# Patient Record
Sex: Male | Born: 1940
Health system: Southern US, Community
[De-identification: ages and names within clinical notes are randomized; demographics above are authoritative.]

## PROBLEM LIST (undated history)

## (undated) DIAGNOSIS — G629 Polyneuropathy, unspecified: Secondary | ICD-10-CM

## (undated) DIAGNOSIS — G250 Essential tremor: Secondary | ICD-10-CM

## (undated) DIAGNOSIS — C61 Malignant neoplasm of prostate: Secondary | ICD-10-CM

## (undated) DIAGNOSIS — R972 Elevated prostate specific antigen [PSA]: Secondary | ICD-10-CM

## (undated) DIAGNOSIS — M549 Dorsalgia, unspecified: Secondary | ICD-10-CM

## (undated) DIAGNOSIS — E7800 Pure hypercholesterolemia, unspecified: Secondary | ICD-10-CM

## (undated) DIAGNOSIS — R000 Tachycardia, unspecified: Secondary | ICD-10-CM

## (undated) HISTORY — DX: Dorsalgia, unspecified: M54.9

## (undated) HISTORY — DX: Pure hypercholesterolemia, unspecified: E78.00

## (undated) HISTORY — DX: Polyneuropathy, unspecified: G62.9

## (undated) HISTORY — PX: LUMBAR MICRODISCECTOMY: SHX99

## (undated) HISTORY — DX: Elevated prostate specific antigen (PSA): R97.20

## (undated) HISTORY — DX: Essential tremor: G25.0

---

## 1999-09-04 ENCOUNTER — Encounter: Payer: Self-pay | Admitting: Emergency Medicine

## 1999-09-04 ENCOUNTER — Encounter: Admission: RE | Admit: 1999-09-04 | Discharge: 1999-09-04 | Payer: Self-pay | Admitting: Emergency Medicine

## 2003-05-03 ENCOUNTER — Encounter: Admission: RE | Admit: 2003-05-03 | Discharge: 2003-05-03 | Payer: Self-pay | Admitting: Neurosurgery

## 2003-07-04 ENCOUNTER — Encounter: Admission: RE | Admit: 2003-07-04 | Discharge: 2003-07-04 | Payer: Self-pay | Admitting: Neurosurgery

## 2009-10-21 ENCOUNTER — Encounter: Admission: RE | Admit: 2009-10-21 | Discharge: 2009-10-21 | Payer: Self-pay | Admitting: Orthopedic Surgery

## 2010-05-20 ENCOUNTER — Encounter: Payer: Self-pay | Admitting: Orthopedic Surgery

## 2010-11-09 ENCOUNTER — Other Ambulatory Visit: Payer: Self-pay | Admitting: Orthopaedic Surgery

## 2010-11-09 DIAGNOSIS — M545 Low back pain, unspecified: Secondary | ICD-10-CM

## 2010-11-10 ENCOUNTER — Ambulatory Visit
Admission: RE | Admit: 2010-11-10 | Discharge: 2010-11-10 | Disposition: A | Discharge status: Home / Self Care | Payer: Medicare Other | Source: Ambulatory Visit | Attending: Orthopaedic Surgery | Admitting: Orthopaedic Surgery

## 2010-11-10 DIAGNOSIS — M545 Low back pain, unspecified: Secondary | ICD-10-CM

## 2014-02-03 ENCOUNTER — Other Ambulatory Visit: Payer: Self-pay | Admitting: Orthopedic Surgery

## 2014-02-03 DIAGNOSIS — M7918 Myalgia, other site: Secondary | ICD-10-CM

## 2014-02-04 ENCOUNTER — Ambulatory Visit
Admission: RE | Admit: 2014-02-04 | Discharge: 2014-02-04 | Disposition: A | Discharge status: Home / Self Care | Payer: Medicare Other | Source: Ambulatory Visit | Attending: Orthopedic Surgery | Admitting: Orthopedic Surgery

## 2014-02-04 DIAGNOSIS — M7918 Myalgia, other site: Secondary | ICD-10-CM

## 2014-02-04 MED ORDER — METHYLPREDNISOLONE ACETATE 40 MG/ML INJ SUSP (RADIOLOG: 120.0000 mg | Freq: Once | INTRAMUSCULAR | Status: DC

## 2014-02-07 ENCOUNTER — Other Ambulatory Visit: Payer: Medicare Other

## 2014-08-31 ENCOUNTER — Other Ambulatory Visit: Payer: Self-pay | Admitting: Orthopedic Surgery

## 2014-08-31 DIAGNOSIS — G8929 Other chronic pain: Secondary | ICD-10-CM

## 2014-08-31 DIAGNOSIS — M533 Sacrococcygeal disorders, not elsewhere classified: Principal | ICD-10-CM

## 2014-09-05 ENCOUNTER — Other Ambulatory Visit: Payer: Self-pay

## 2014-09-09 ENCOUNTER — Ambulatory Visit
Admission: RE | Admit: 2014-09-09 | Discharge: 2014-09-09 | Disposition: A | Discharge status: Home / Self Care | Payer: Medicare Other | Source: Ambulatory Visit | Attending: Orthopedic Surgery | Admitting: Orthopedic Surgery

## 2014-09-09 DIAGNOSIS — G8929 Other chronic pain: Secondary | ICD-10-CM

## 2014-09-09 DIAGNOSIS — M533 Sacrococcygeal disorders, not elsewhere classified: Principal | ICD-10-CM

## 2014-10-17 ENCOUNTER — Other Ambulatory Visit: Payer: Self-pay | Admitting: Orthopedic Surgery

## 2014-10-17 DIAGNOSIS — M5417 Radiculopathy, lumbosacral region: Secondary | ICD-10-CM

## 2014-10-18 ENCOUNTER — Ambulatory Visit
Admission: RE | Admit: 2014-10-18 | Discharge: 2014-10-18 | Disposition: A | Discharge status: Home / Self Care | Payer: Medicare Other | Source: Ambulatory Visit | Attending: Orthopedic Surgery | Admitting: Orthopedic Surgery

## 2014-10-18 DIAGNOSIS — M5417 Radiculopathy, lumbosacral region: Secondary | ICD-10-CM

## 2014-10-18 MED ORDER — IOHEXOL 180 MG/ML  SOLN
1.0000 mL | Freq: Once | INTRAMUSCULAR | Status: AC | PRN
  Administered 2014-10-18: 1 mL via EPIDURAL

## 2014-10-18 MED ORDER — METHYLPREDNISOLONE ACETATE 40 MG/ML INJ SUSP (RADIOLOG
120.0000 mg | Freq: Once | INTRAMUSCULAR | Status: AC
  Administered 2014-10-18: 120 mg via EPIDURAL

## 2014-10-18 NOTE — Discharge Instructions (Signed)

## 2015-07-10 ENCOUNTER — Other Ambulatory Visit: Payer: Self-pay | Admitting: Orthopedic Surgery

## 2015-07-10 DIAGNOSIS — G5701 Lesion of sciatic nerve, right lower limb: Secondary | ICD-10-CM

## 2015-07-17 ENCOUNTER — Other Ambulatory Visit: Payer: Self-pay | Admitting: Orthopedic Surgery

## 2015-07-17 DIAGNOSIS — G5701 Lesion of sciatic nerve, right lower limb: Secondary | ICD-10-CM

## 2015-07-18 ENCOUNTER — Other Ambulatory Visit: Payer: Medicare Other

## 2015-07-25 ENCOUNTER — Other Ambulatory Visit: Payer: Self-pay | Admitting: Orthopedic Surgery

## 2015-07-26 ENCOUNTER — Ambulatory Visit
Admission: RE | Admit: 2015-07-26 | Discharge: 2015-07-26 | Disposition: A | Discharge status: Home / Self Care | Payer: Medicare Other | Source: Ambulatory Visit | Attending: Orthopedic Surgery | Admitting: Orthopedic Surgery

## 2015-07-26 DIAGNOSIS — G5701 Lesion of sciatic nerve, right lower limb: Secondary | ICD-10-CM

## 2015-07-26 MED ORDER — METHYLPREDNISOLONE ACETATE 40 MG/ML INJ SUSP (RADIOLOG: 120.0000 mg | Freq: Once | INTRAMUSCULAR | Status: DC

## 2015-12-20 ENCOUNTER — Other Ambulatory Visit: Payer: Self-pay | Admitting: Orthopaedic Surgery

## 2015-12-20 DIAGNOSIS — M4807 Spinal stenosis, lumbosacral region: Secondary | ICD-10-CM

## 2015-12-20 DIAGNOSIS — M4716 Other spondylosis with myelopathy, lumbar region: Secondary | ICD-10-CM

## 2016-01-04 ENCOUNTER — Inpatient Hospital Stay: Admission: RE | Admit: 2016-01-04 | Payer: Medicare Other | Source: Ambulatory Visit

## 2016-06-20 ENCOUNTER — Other Ambulatory Visit: Payer: Self-pay | Admitting: Orthopaedic Surgery

## 2016-06-20 DIAGNOSIS — M4807 Spinal stenosis, lumbosacral region: Secondary | ICD-10-CM

## 2016-06-22 ENCOUNTER — Ambulatory Visit
Admission: RE | Admit: 2016-06-22 | Discharge: 2016-06-22 | Disposition: A | Discharge status: Home / Self Care | Payer: Medicare Other | Source: Ambulatory Visit | Attending: Orthopaedic Surgery | Admitting: Orthopaedic Surgery

## 2016-06-22 DIAGNOSIS — M4807 Spinal stenosis, lumbosacral region: Secondary | ICD-10-CM

## 2016-09-13 ENCOUNTER — Other Ambulatory Visit: Payer: Self-pay | Admitting: Orthopaedic Surgery

## 2016-09-16 ENCOUNTER — Other Ambulatory Visit: Payer: Self-pay | Admitting: Orthopaedic Surgery

## 2016-09-16 DIAGNOSIS — M461 Sacroiliitis, not elsewhere classified: Secondary | ICD-10-CM

## 2016-09-17 ENCOUNTER — Ambulatory Visit
Admission: RE | Admit: 2016-09-17 | Discharge: 2016-09-17 | Disposition: A | Discharge status: Home / Self Care | Payer: Medicare Other | Source: Ambulatory Visit | Attending: Orthopaedic Surgery | Admitting: Orthopaedic Surgery

## 2016-09-17 DIAGNOSIS — M461 Sacroiliitis, not elsewhere classified: Secondary | ICD-10-CM

## 2016-09-17 MED ORDER — GADOBENATE DIMEGLUMINE 529 MG/ML IV SOLN
13.0000 mL | Freq: Once | INTRAVENOUS | Status: AC | PRN
  Administered 2016-09-17: 13 mL via INTRAVENOUS

## 2017-01-22 ENCOUNTER — Ambulatory Visit (INDEPENDENT_AMBULATORY_CARE_PROVIDER_SITE_OTHER): Payer: Medicare Other | Admitting: Neurology

## 2017-01-22 ENCOUNTER — Encounter: Payer: Self-pay | Admitting: *Deleted

## 2017-01-22 ENCOUNTER — Ambulatory Visit: Payer: Medicare Other | Admitting: Neurology

## 2017-01-22 ENCOUNTER — Encounter: Payer: Self-pay | Admitting: Neurology

## 2017-01-22 DIAGNOSIS — M5416 Radiculopathy, lumbar region: Secondary | ICD-10-CM

## 2017-01-22 NOTE — Progress Notes (Signed)
PATIENT: Jeff Willis DOB: 01-20-1941  Chief Complaint  Patient presents with  . Back Pain    Reports worsening of low back pain radiating into his right leg.  He also has burning sensations in his right foot.  He had a L4-5 microdiscectomy in March 2018.  Marland Kitchen PCP    Gaynelle Arabian, MD     HISTORICAL  Jeff Willis Is a 76 years old maleseen in refer by  his primary care doctor Dr. [name], [name] for evaluation of back pain, initial evaluation was on January 22 2017.  I reviewed and summarized the referral note, he has history of essential tremor,hyperlipidemia  He reported a history of chronic low back pain, gradually getting worse, radiating pain to right hip, right calf area, had MRI of the lumbar spine February 2018, there is evidence of degenerative disc disease, facet arthropathy at L5-S1, mild to moderate canal stenosis, bilateral subarticular recess narrowing, mild bilateral foraminal narrowing,  He underwent decompression surgery by Dr. Rennis Harding in March 2018, he had no significant improvement post surgically, now he still has intermittent right low back pain radiating to right hip, right lateral calf bottom of right foot burning sensation, getting worse with prolonged sitting. He denies gait abnormality, no bowel and bladder incontinence.  He had MRI of pelvic with and without contrast on Sep 17 2016 there was no significant abnormality noted.  Laboratory evaluation his in August 2018, showed LDL 123, normal CMP creatinine of 0.9, normal CBC hemoglobin of 15.5, elevated PSA 10.11  REVIEW OF SYSTEMS: Full 14 system review of systems performed and notable only for difficulty swallowing, joints pain  ALLERGIES: No Known Allergies  HOME MEDICATIONS: Current Outpatient Prescriptions  Medication Sig Dispense Refill  . aspirin 81 MG tablet Take 81 mg by mouth daily.     No current facility-administered medications for this visit.     PAST MEDICAL  HISTORY: Past Medical History:  Diagnosis Date  . Back pain   . Elevated PSA   . Essential tremor   . Hypercholesteremia   . Neuropathy     PAST SURGICAL HISTORY: Past Surgical History:  Procedure Laterality Date  . LUMBAR MICRODISCECTOMY     L4-5    FAMILY HISTORY: Family History  Problem Relation Age of Onset  . Other Mother        died at 97 of natural causes  . Other Father        unsure of health    SOCIAL HISTORY:  Social History   Social History  . Marital status: Married    Spouse name: N/A  . Number of children: 3  . Years of education: Masters   Occupational History  . Motel Manager    Social History Main Topics  . Smoking status: Never Smoker  . Smokeless tobacco: Never Used  . Alcohol use No  . Drug use: No  . Sexual activity: Not on file   Other Topics Concern  . Not on file   Social History Narrative   Lives at home with his family.   Right-handed.   No caffeine use.     PHYSICAL EXAM   Vitals:   01/22/17 1259  BP: (!) 143/91  Pulse: (!) 55  Weight: 143 lb (64.9 kg)  Height: 5\' 7"  (1.702 m)    Not recorded      Body mass index is 22.4 kg/m.  PHYSICAL EXAMNIATION:  Gen: NAD, conversant, well nourised, obese, well groomed  Cardiovascular: Regular rate rhythm, no peripheral edema, warm, nontender. Eyes: Conjunctivae clear without exudates or hemorrhage Neck: Supple, no carotid bruits. Pulmonary: Clear to auscultation bilaterally   NEUROLOGICAL EXAM:  MENTAL STATUS: Speech:    Speech is normal; fluent and spontaneous with normal comprehension.  Cognition:     Orientation to time, place and person     Normal recent and remote memory     Normal Attention span and concentration     Normal Language, naming, repeating,spontaneous speech     Fund of knowledge   CRANIAL NERVES: CN II: Visual fields are full to confrontation. Fundoscopic exam is normal with sharp discs and no vascular changes. Pupils are  round equal and briskly reactive to light. CN III, IV, VI: extraocular movement are normal. No ptosis. CN V: Facial sensation is intact to pinprick in all 3 divisions bilaterally. Corneal responses are intact.  CN VII: Face is symmetric with normal eye closure and smile. CN VIII: Hearing is normal to rubbing fingers CN IX, X: Palate elevates symmetrically. Phonation is normal. CN XI: Head turning and shoulder shrug are intact CN XII: Tongue is midline with normal movements and no atrophy.  MOTOR: There is no pronator drift of out-stretched arms. Muscle bulk and tone are normal. Muscle strength is normal.  REFLEXES: Reflexes are 2+ and symmetric at the biceps, triceps, knees, and ankles. Plantar responses are flexor.  SENSORY: Intact to light touch, pinprick, positional sensation and vibratory sensation are intact in fingers and toes.  COORDINATION: Rapid alternating movements and fine finger movements are intact. There is no dysmetria on finger-to-nose and heel-knee-shin.    GAIT/STANCE: Posture is normal. Gait is steady with normal steps, base, arm swing, and turning. Heel and toe walking are normal. Tandem gait is normal.  Romberg is absent.   DIAGNOSTIC DATA (LABS, IMAGING, TESTING) - I reviewed patient records, labs, notes, testing and imaging myself where available.   ASSESSMENT AND PLAN  Jeff BHAVYA GRAND is a 76 y.o. male   right lumbar radiculopathy  There is no evidence of nerve damage weakness,  As needed NSAIDs, back stretching exercise, heating pad   Marcial Pacas, M.D. Ph.D.  [facility] [address] [place], [place] [address] Ph: [phone] Fax: [phone]  CC:Gaynelle Arabian, MD

## 2017-01-23 DIAGNOSIS — R059 Cough, unspecified: Secondary | ICD-10-CM | POA: Insufficient documentation

## 2017-01-23 DIAGNOSIS — K219 Gastro-esophageal reflux disease without esophagitis: Secondary | ICD-10-CM | POA: Insufficient documentation

## 2017-07-17 DIAGNOSIS — R972 Elevated prostate specific antigen [PSA]: Secondary | ICD-10-CM | POA: Diagnosis not present

## 2017-07-24 DIAGNOSIS — M25512 Pain in left shoulder: Secondary | ICD-10-CM | POA: Diagnosis not present

## 2017-07-24 DIAGNOSIS — M545 Low back pain: Secondary | ICD-10-CM | POA: Diagnosis not present

## 2017-07-24 DIAGNOSIS — R972 Elevated prostate specific antigen [PSA]: Secondary | ICD-10-CM | POA: Diagnosis not present

## 2017-07-24 DIAGNOSIS — M7552 Bursitis of left shoulder: Secondary | ICD-10-CM | POA: Diagnosis not present

## 2017-07-31 DIAGNOSIS — R972 Elevated prostate specific antigen [PSA]: Secondary | ICD-10-CM | POA: Diagnosis not present

## 2017-07-31 DIAGNOSIS — D075 Carcinoma in situ of prostate: Secondary | ICD-10-CM | POA: Diagnosis not present

## 2017-07-31 DIAGNOSIS — C61 Malignant neoplasm of prostate: Secondary | ICD-10-CM | POA: Diagnosis not present

## 2017-08-12 DIAGNOSIS — E538 Deficiency of other specified B group vitamins: Secondary | ICD-10-CM | POA: Diagnosis not present

## 2017-08-12 DIAGNOSIS — E559 Vitamin D deficiency, unspecified: Secondary | ICD-10-CM | POA: Diagnosis not present

## 2017-09-09 DIAGNOSIS — C61 Malignant neoplasm of prostate: Secondary | ICD-10-CM | POA: Diagnosis not present

## 2017-09-11 ENCOUNTER — Encounter: Payer: Self-pay | Admitting: Radiation Oncology

## 2017-10-01 DIAGNOSIS — C61 Malignant neoplasm of prostate: Secondary | ICD-10-CM | POA: Diagnosis not present

## 2017-10-06 ENCOUNTER — Ambulatory Visit: Payer: Medicare Other | Admitting: Radiation Oncology

## 2017-10-07 ENCOUNTER — Encounter: Payer: Self-pay | Admitting: Radiation Oncology

## 2017-10-07 NOTE — Progress Notes (Signed)
GU Location of Tumor / Histology: prostatic adenocarcinoma  If Prostate Cancer, Gleason Score is (4 + 3) and PSA is (9.83). Prostate volume: 72 grams   Chhitubhal G Zulueta was referred by Dr. Gaynelle Arabian to Dr. Junious Silk in September 2017.  08/16/2016 PSA  7.4 04/2016  PSA  6.27 Drawn in [place]   Biopsies of prostate (if applicable) revealed:    Past/Anticipated interventions by urology, if any: prostate biopsy, referral to radiation oncology (pt hopes to qualify for seeds), [place] mentioned ADT to shrink large gland in notes. Patient denies receiving or discussing ADT.   Past/Anticipated interventions by medical oncology, if any: no  Weight changes, if any: no  Bowel/Bladder complaints, if any: IPSS 3. Denies dysuria, hematuria, urinary leakage or incontinence.   Nausea/Vomiting, if any: no  Pain issues, if any: Reports chronic low back pain. Reports having a minor surgery on L5. Reports participating in physical therapy in the past to manage it. Denies taking medication to manage back pain.   SAFETY ISSUES:  Prior radiation? no  Pacemaker/ICD? no  Possible current pregnancy? no  Is the patient on methotrexate? no  Current Complaints / other details:  77 year old. Married with one daughter and two sons. Patient travels back to [place] once a year to stay from mid October to the end of January.

## 2017-10-08 ENCOUNTER — Ambulatory Visit
Admission: RE | Admit: 2017-10-08 | Discharge: 2017-10-08 | Disposition: A | Discharge status: Home / Self Care | Payer: Medicare Other | Source: Ambulatory Visit | Attending: Radiation Oncology | Admitting: Radiation Oncology

## 2017-10-08 ENCOUNTER — Encounter: Payer: Self-pay | Admitting: Radiation Oncology

## 2017-10-08 ENCOUNTER — Other Ambulatory Visit: Payer: Self-pay

## 2017-10-08 VITALS — BP 149/89 | HR 120 | Temp 97.9°F | Resp 20 | Ht 67.0 in | Wt 138.8 lb

## 2017-10-08 DIAGNOSIS — E7800 Pure hypercholesterolemia, unspecified: Secondary | ICD-10-CM | POA: Insufficient documentation

## 2017-10-08 DIAGNOSIS — G629 Polyneuropathy, unspecified: Secondary | ICD-10-CM | POA: Insufficient documentation

## 2017-10-08 DIAGNOSIS — C61 Malignant neoplasm of prostate: Secondary | ICD-10-CM

## 2017-10-08 DIAGNOSIS — R972 Elevated prostate specific antigen [PSA]: Secondary | ICD-10-CM | POA: Diagnosis not present

## 2017-10-08 DIAGNOSIS — Z79899 Other long term (current) drug therapy: Secondary | ICD-10-CM | POA: Insufficient documentation

## 2017-10-08 DIAGNOSIS — G250 Essential tremor: Secondary | ICD-10-CM | POA: Diagnosis not present

## 2017-10-08 DIAGNOSIS — N400 Enlarged prostate without lower urinary tract symptoms: Secondary | ICD-10-CM | POA: Diagnosis not present

## 2017-10-08 HISTORY — DX: Malignant neoplasm of prostate: C61

## 2017-10-08 NOTE — Progress Notes (Signed)
Radiation Oncology         [phone] ________________________________  Initial outpatient Consultation  Name: Jeff Willis MRN: [id number]  Date: 10/08/2017  DOB: June 29, 1940  CC:Jeff Arabian, MD  Jeff Aloe, MD   REFERRING PHYSICIAN: Festus Aloe, MD  DIAGNOSIS: 77 y.o. gentleman with Stage T1c adenocarcinoma of the prostate with Gleason Score of 4+3, and PSA of 9.8.     ICD-10-CM   1. Malignant neoplasm of prostate (Jeff Willis) C61 Ambulatory referral to Social Work    HISTORY OF PRESENT ILLNESS: Jeff Willis is a 77 y.o. male with a diagnosis of prostate cancer. He is a long-standing history of elevated PSA dating back to 2011 but was noted to have an elevated PSA of 5.65 in 11/2015 by his primary care physician, Dr. Marisue Willis.  Accordingly, he was referred for evaluation in urology by Dr. Junious Willis on 01/16/16,  digital rectal examination was performed at that time revealing prostate lobe asymmetry with right greater than left but no discrete nodularity, induration or tenderness.  Prostate biopsy was discussed at that time but the patient elected to proceed with watchful waiting.  He has continued to be diligent in follow-up with a repeat PSA of 7.4 in April 2018, 6.69 10/04/16 and 6.27 in January 2019 while he was away in [place] visiting family.  The PSA was noted to be further elevated at 9.52 at the time of his follow-up on 01/10/2017.  The digital rectal exam remained negative and the patient remained without complaints of lower urinary tract symptoms and thus elected to continue an active surveillance.  Repeat PSA on 07/17/2016 was 9.83.  At that point, the patient agreed to proceed to transrectal ultrasound with 12 biopsies of the prostate, performed on 07/31/17.  The prostate volume measured 72 cc.  Out of 12 core biopsies, 2 were positive.  The maximum Gleason score was 4+3, and this was seen in left base and left mid gland.   PSA Trend: 2008         1.42 08/2009     6.07 01/2010    2.49 01/2015    3.38 01/2016    7.02 12/2015    5.65 05/18/16     6.27 (Drawn in [place]) 08/16/16     7.4   01/2017    10 08/2017    9.8    The patient reviewed the biopsy results with his urologist and he has kindly been referred today for discussion of potential radiation treatment options.     PREVIOUS RADIATION THERAPY: No  PAST MEDICAL HISTORY:  Past Medical History:  Diagnosis Date  . Back pain   . Elevated PSA   . Essential tremor   . Hypercholesteremia   . Neuropathy   . Prostate cancer ([place])       PAST SURGICAL HISTORY: Past Surgical History:  Procedure Laterality Date  . LUMBAR MICRODISCECTOMY     L4-5    FAMILY HISTORY:  Family History  Problem Relation Age of Onset  . Other Mother        died at 32 of natural causes  . Other Father        unsure of health  . Cancer Neg Hx     SOCIAL HISTORY:  Social History   Socioeconomic History  . Marital status: Married    Spouse name: Not on file  . Number of children: 3  . Years of education: Masters  . Highest education level: Not on file  Occupational History  . Occupation:  Motel Manager    Comment: working part time  Social Needs  . Financial resource strain: Not on file  . Food insecurity:    Worry: Not on file    Inability: Not on file  . Transportation needs:    Medical: Not on file    Non-medical: Not on file  Tobacco Use  . Smoking status: Never Smoker  . Smokeless tobacco: Never Used  Substance and Sexual Activity  . Alcohol use: No  . Drug use: No  . Sexual activity: Yes  Lifestyle  . Physical activity:    Days per week: Not on file    Minutes per session: Not on file  . Stress: Not on file  Relationships  . Social connections:    Talks on phone: Not on file    Gets together: Not on file    Attends religious service: Not on file    Active member of club or organization: Not on file    Attends meetings of clubs or organizations: Not on file    Relationship  status: Not on file  . Intimate partner violence:    Fear of current or ex partner: Not on file    Emotionally abused: Not on file    Physically abused: Not on file    Forced sexual activity: Not on file  Other Topics Concern  . Not on file  Social History Narrative   Lives at home with his family.   Right-handed.   No caffeine use.   One daughter and two sons.    ALLERGIES: Patient has no known allergies.  MEDICATIONS:  Current Outpatient Medications  Medication Sig Dispense Refill  . calcium-vitamin D (OSCAL WITH D) 500-200 MG-UNIT tablet Take 1 tablet by mouth.    . vitamin B-12 (CYANOCOBALAMIN) 1000 MCG tablet Take 1,000 mcg by mouth daily.     No current facility-administered medications for this encounter.     REVIEW OF SYSTEMS:  On review of systems, the patient reports that he is doing well overall. He denies any chest pain, shortness of breath, cough, fevers, chills, night sweats, unintended weight changes. He denies any bowel disturbances, and denies abdominal pain, nausea or vomiting. He denies any new musculoskeletal or joint aches or pains. His IPSS was 3, indicating mild urinary symptoms. He is able to complete sexual activity. A complete review of systems is obtained and is otherwise negative.   PHYSICAL EXAM:  Wt Readings from Last 3 Encounters:  10/08/17 138 lb 12.8 oz (63 kg)  01/22/17 143 lb (64.9 kg)   Temp Readings from Last 3 Encounters:  10/08/17 97.9 F (36.6 C) (Oral)   BP Readings from Last 3 Encounters:  10/08/17 (!) 149/89  01/22/17 (!) 143/91  02/04/14 (!) 146/87   Pulse Readings from Last 3 Encounters:  10/08/17 (!) 120  01/22/17 (!) 55  02/04/14 74    /10  In general this is a well appearing [place] gentleman in no acute distress. He is alert and oriented x4 and appropriate throughout the examination. HEENT reveals that the patient is normocephalic, atraumatic. EOMs are intact. PERRLA. Skin is intact without any evidence of gross lesions.  Cardiovascular exam reveals a regular rate and rhythm, no clicks rubs or murmurs are auscultated. Chest is clear to auscultation bilaterally. Lymphatic assessment is performed and does not reveal any adenopathy in the cervical, supraclavicular, axillary, or inguinal chains. Abdomen has active bowel sounds in all quadrants and is intact. The abdomen is soft, non tender, non distended. Lower  extremities are negative for pretibial pitting edema, deep calf tenderness, cyanosis or clubbing.  KPS = 100   100 - Normal; no complaints; no evidence of disease. 90   - Able to carry on normal activity; minor signs or symptoms of disease. 80   - Normal activity with effort; some signs or symptoms of disease. 27   - Cares for self; unable to carry on normal activity or to do active work. 60   - Requires occasional assistance, but is able to care for most of his personal needs. 50   - Requires considerable assistance and frequent medical care. 59   - Disabled; requires special care and assistance. 37   - Severely disabled; hospital admission is indicated although death not imminent. 43   - Very sick; hospital admission necessary; active supportive treatment necessary. 10   - Moribund; fatal processes progressing rapidly. 0     - Dead  Karnofsky DA, Abelmann WH, Craver LS and Burchenal JH [phone]) The use of the nitrogen mustards in the palliative treatment of carcinoma: with particular reference to bronchogenic carcinoma Cancer 1 634-56  LABORATORY DATA:  No results found for: WBC, HGB, HCT, MCV, PLT No results found for: NA, K, CL, CO2 No results found for: ALT, AST, GGT, ALKPHOS, BILITOT   RADIOGRAPHY: No results found.    IMPRESSION/PLAN: 1. 77 y.o. gentleman with Stage T1c adenocarcinoma of the prostate with Gleason Score of 4+3, and PSA of 9.8. We discussed the patient's workup and outlined the nature of prostate cancer in this setting. The patient's T stage, Gleason's score, and PSA put him into the  unfavorable intermedate risk group. Accordingly, he is eligible for a variety of potential treatment options including brachytherapy, 5.5 - 8 weeks of external radiation or 5 weeks of external radiation followed by a brachytherapy boost. We discussed the available radiation techniques, and focused on the details and logistics and delivery. The patient may not be an ideal candidate for brachytherapy boost with a prostate volume of 72 gm prior to downsizing from hormone therapy. We discussed that based on his prostate volume, he would require beginning treatment with a 5 alpha reductase inhibitor and/or ADT for at least 3 months to allow for downsizing of the prostate prior to initiating radiotherapy. We would need to repeat a prostate imaging after 3 months of therapy to reassess the prostate volume at that time and confirm whether he is a candidate for brachytherapy. We discussed and outlined the risks, benefits, short and long-term effects associated with radiotherapy and compared and contrasted these with prostatectomy. We discussed the role of SpaceOAR in reducing the rectal toxicity associated with radiotherapy. We also detailed the role of ADT in the treatment of unfavorable intermediate risk prostate cancer and outlined the associated side effects that could be expected with this therapy.  We specifically discussed that while the use of ADT in combination with radiotherapy for intermediate risk prostate cancers has ben proven to extend the cancer free progression interval, it does not improve the overall survival rate.  Thus, the recommendation for use in his case would be primarily for the purpose of downsizing the prostate to allow for safe brachytherapy and to minimize the risk of post-procedural AUR.  The patient and his family were encouraged to ask questions that were answered to their stated satisfaction.  He appears to have a good understanding of his disease, treatment options and our  recommendations.  At the conclusion of our conversation, the patient elects to proceed with  starting a 5 alpha reductase inhibitor to reduce the prostate volume while minimizing treatment side effects.  He has an important conference/convention that he is leading in late July and would like to avoid starting ADT until after the conference.  We informed him that we would share our discussion with Dr. Junious Willis and that final treatment decisions regarding medication to downsize the prostate would be made at the discretion of Dr. Junious Willis.  In the meantime, we will tentatively proceed with scheduling brachytherapy and SpaceOAR for mid September 2019, to allow time for 5-ARI/ADT to have adequate effect in downsizing.  He will have a CT simulation to reassess prostate volume prior to his procedure to confirm candidacy.     Nicholos Johns, PA-C    [name], MD  [facility] Direct Dial: [phone]  Fax: [phone] [email or web address]  Skype  [email or web address]  This document serves as a record of services personally performed by [name], MD and Ashlyn Bruning PA-C. It was created on their behalf by Delton Coombes, a trained medical scribe. The creation of this record is based on the scribe's personal observations and the provider's statements to them.

## 2017-10-08 NOTE — Progress Notes (Signed)
See progress note under physician encounter. 

## 2017-10-13 ENCOUNTER — Encounter: Payer: Self-pay | Admitting: Medical Oncology

## 2017-10-13 DIAGNOSIS — E538 Deficiency of other specified B group vitamins: Secondary | ICD-10-CM | POA: Diagnosis not present

## 2017-10-13 DIAGNOSIS — E559 Vitamin D deficiency, unspecified: Secondary | ICD-10-CM | POA: Diagnosis not present

## 2017-10-14 ENCOUNTER — Encounter: Payer: Self-pay | Admitting: Medical Oncology

## 2017-10-14 ENCOUNTER — Telehealth: Payer: Self-pay | Admitting: Medical Oncology

## 2017-10-14 NOTE — Progress Notes (Signed)
Called to introduce myself to Jeff Willis as the prostate nurse navigator and my role. I was unable to meet him the day he consulted with Dr. Tammi Klippel. He is interested in brachytherapy but has a large prostate. He will need 5-ARI and Lupron to hopefully reduce the volume. He has plans to be out of the country in late July and would like to start ADT after his return. I will continue to follow.

## 2017-10-14 NOTE — Telephone Encounter (Signed)
I called patient to inform him that I spoke with Dr. Lyndal Rainbow nurse regarding the 5-ARI medication so he should hear back from them. I will continue to follow and keep you updated. He voiced understanding.

## 2017-10-14 NOTE — Progress Notes (Signed)
Called Dr. Lyndal Rainbow nurse to inform her patient is concerned because he has not heard from Dr. Junious Silk regarding 5-ARI. He would like to start the medication before he leaves the country in July if Dr. Junious Silk is in agreement. I faxed her the office consult note 10/08/17.

## 2017-10-16 ENCOUNTER — Telehealth: Payer: Self-pay | Admitting: Medical Oncology

## 2017-10-16 NOTE — Telephone Encounter (Signed)
Spoke with patient to confirm he is aware Dr. Junious Silk called Finasteride 6/18 for him. He states that he has already started the medication and will be scheduled for Lupron. He has follow up with Dr. Junious Silk 6/24 for labs. I asked him to call me with questions or concerns. Ashlyn-PA notified.

## 2017-12-05 DIAGNOSIS — R3912 Poor urinary stream: Secondary | ICD-10-CM | POA: Diagnosis not present

## 2017-12-05 DIAGNOSIS — C61 Malignant neoplasm of prostate: Secondary | ICD-10-CM | POA: Diagnosis not present

## 2017-12-05 DIAGNOSIS — N401 Enlarged prostate with lower urinary tract symptoms: Secondary | ICD-10-CM | POA: Diagnosis not present

## 2017-12-17 DIAGNOSIS — C61 Malignant neoplasm of prostate: Secondary | ICD-10-CM | POA: Diagnosis not present

## 2017-12-18 DIAGNOSIS — E7800 Pure hypercholesterolemia, unspecified: Secondary | ICD-10-CM | POA: Diagnosis not present

## 2017-12-18 DIAGNOSIS — G629 Polyneuropathy, unspecified: Secondary | ICD-10-CM | POA: Diagnosis not present

## 2017-12-18 DIAGNOSIS — C61 Malignant neoplasm of prostate: Secondary | ICD-10-CM | POA: Diagnosis not present

## 2017-12-18 DIAGNOSIS — Z1389 Encounter for screening for other disorder: Secondary | ICD-10-CM | POA: Diagnosis not present

## 2017-12-18 DIAGNOSIS — E559 Vitamin D deficiency, unspecified: Secondary | ICD-10-CM | POA: Diagnosis not present

## 2017-12-18 DIAGNOSIS — E538 Deficiency of other specified B group vitamins: Secondary | ICD-10-CM | POA: Diagnosis not present

## 2017-12-18 DIAGNOSIS — Z0000 Encounter for general adult medical examination without abnormal findings: Secondary | ICD-10-CM | POA: Diagnosis not present

## 2017-12-18 DIAGNOSIS — G250 Essential tremor: Secondary | ICD-10-CM | POA: Diagnosis not present

## 2017-12-30 ENCOUNTER — Other Ambulatory Visit: Payer: Self-pay | Admitting: Urology

## 2017-12-30 ENCOUNTER — Telehealth: Payer: Self-pay | Admitting: *Deleted

## 2017-12-30 DIAGNOSIS — C61 Malignant neoplasm of prostate: Secondary | ICD-10-CM

## 2017-12-30 NOTE — Telephone Encounter (Signed)
Called patient to give update, lvm for a return call 

## 2018-01-01 ENCOUNTER — Other Ambulatory Visit: Payer: Self-pay | Admitting: Urology

## 2018-01-01 ENCOUNTER — Encounter: Payer: Self-pay | Admitting: Medical Oncology

## 2018-01-01 ENCOUNTER — Ambulatory Visit
Admission: RE | Admit: 2018-01-01 | Discharge: 2018-01-01 | Disposition: A | Discharge status: Home / Self Care | Payer: Medicare Other | Source: Ambulatory Visit | Attending: Radiation Oncology | Admitting: Radiation Oncology

## 2018-01-01 DIAGNOSIS — C61 Malignant neoplasm of prostate: Secondary | ICD-10-CM

## 2018-01-01 NOTE — Progress Notes (Signed)
  Radiation Oncology         [phone] ________________________________  Name: HEIDI LEMAY MRN: [id number]  Date: 01/01/2018  DOB: 21-Nov-1940  SIMULATION AND TREATMENT PLANNING NOTE PUBIC ARCH STUDY  UY:EBXIDHW, [name], MD  Festus Aloe, MD  DIAGNOSIS: 77 y.o. gentleman with Stage T1c adenocarcinoma of the prostate with Gleason Score of 4+3, and PSA of 9.8.     ICD-10-CM   1. Malignant neoplasm of prostate ([place]) C61     COMPLEX SIMULATION:  The patient presented today for evaluation for possible prostate seed implant. He was brought to the radiation planning suite and placed supine on the CT couch. A 3-dimensional image study set was obtained in upload to the planning computer. There, on each axial slice, I contoured the prostate gland. Then, using three-dimensional radiation planning tools I reconstructed the prostate in view of the structures from the transperineal needle pathway to assess for possible pubic arch interference. In doing so, I did not appreciate any pubic arch interference. Also, the patient's prostate volume was estimated based on the drawn structure. The volume was 39 cc following downsizing by urology.  Given the pubic arch appearance and prostate volume, patient remains a good candidate to proceed with prostate seed implant. Today, he freely provided informed written consent to proceed.    PLAN: The patient will undergo prostate seed implant.   ________________________________  Sheral Apley. Tammi Klippel, M.D.

## 2018-01-02 ENCOUNTER — Other Ambulatory Visit: Payer: Self-pay | Admitting: Urology

## 2018-01-27 ENCOUNTER — Telehealth: Payer: Self-pay | Admitting: *Deleted

## 2018-01-27 DIAGNOSIS — R05 Cough: Secondary | ICD-10-CM | POA: Diagnosis not present

## 2018-01-27 DIAGNOSIS — J029 Acute pharyngitis, unspecified: Secondary | ICD-10-CM | POA: Diagnosis not present

## 2018-01-27 NOTE — Telephone Encounter (Signed)
CALLED PATIENT TO ASK ABOUT COMING IN FOR CHEST X-RAY AND EKG, LVM FOR A RETURN CALL

## 2018-01-28 ENCOUNTER — Telehealth: Payer: Self-pay | Admitting: *Deleted

## 2018-01-28 NOTE — Telephone Encounter (Signed)
Called patient to inform of appt. for chest x-ray and EKG on 01-30-18 - arrival time- 1:45 pm @ [facility] Admitting, spoke with patient and he is aware of this appt.

## 2018-01-28 NOTE — Telephone Encounter (Signed)
CALLED PATIENT TO ASK QUESTION, LVM FOR A RETURN CALL 

## 2018-01-29 ENCOUNTER — Inpatient Hospital Stay (HOSPITAL_COMMUNITY): Admission: RE | Admit: 2018-01-29 | Payer: Medicare Other | Source: Ambulatory Visit

## 2018-01-30 ENCOUNTER — Encounter (HOSPITAL_COMMUNITY): Payer: Self-pay

## 2018-01-30 ENCOUNTER — Encounter (HOSPITAL_COMMUNITY)
Admission: RE | Admit: 2018-01-30 | Discharge: 2018-01-30 | Disposition: A | Discharge status: Home / Self Care | Payer: Medicare Other | Source: Ambulatory Visit | Attending: Urology | Admitting: Urology

## 2018-01-30 ENCOUNTER — Ambulatory Visit (HOSPITAL_COMMUNITY)
Admission: RE | Admit: 2018-01-30 | Discharge: 2018-01-30 | Disposition: A | Discharge status: Home / Self Care | Payer: Medicare Other | Source: Ambulatory Visit | Attending: Urology | Admitting: Urology

## 2018-01-30 DIAGNOSIS — C61 Malignant neoplasm of prostate: Secondary | ICD-10-CM | POA: Insufficient documentation

## 2018-01-30 DIAGNOSIS — R9431 Abnormal electrocardiogram [ECG] [EKG]: Secondary | ICD-10-CM | POA: Diagnosis not present

## 2018-01-30 DIAGNOSIS — R000 Tachycardia, unspecified: Secondary | ICD-10-CM

## 2018-01-30 DIAGNOSIS — Z23 Encounter for immunization: Secondary | ICD-10-CM | POA: Diagnosis not present

## 2018-01-30 DIAGNOSIS — R05 Cough: Secondary | ICD-10-CM | POA: Diagnosis not present

## 2018-01-30 HISTORY — DX: Tachycardia, unspecified: R00.0

## 2018-02-02 DIAGNOSIS — H2513 Age-related nuclear cataract, bilateral: Secondary | ICD-10-CM | POA: Diagnosis not present

## 2018-02-02 DIAGNOSIS — H524 Presbyopia: Secondary | ICD-10-CM | POA: Diagnosis not present

## 2018-02-02 DIAGNOSIS — H25013 Cortical age-related cataract, bilateral: Secondary | ICD-10-CM | POA: Diagnosis not present

## 2018-02-02 DIAGNOSIS — H5203 Hypermetropia, bilateral: Secondary | ICD-10-CM | POA: Diagnosis not present

## 2018-02-03 ENCOUNTER — Other Ambulatory Visit: Payer: Self-pay | Admitting: Urology

## 2018-02-03 DIAGNOSIS — C61 Malignant neoplasm of prostate: Secondary | ICD-10-CM

## 2018-02-16 ENCOUNTER — Telehealth: Payer: Self-pay | Admitting: *Deleted

## 2018-02-16 NOTE — Telephone Encounter (Signed)
CALLED PATIENT TO REMIND OF LAB APPT. FOR 02-17-18 - ARRIVAL TIME - 9:45 AM @ [facility] ADMITTING, LVM FOR A RETURN CALL

## 2018-02-17 ENCOUNTER — Encounter (HOSPITAL_COMMUNITY)
Admission: RE | Admit: 2018-02-17 | Discharge: 2018-02-17 | Disposition: A | Discharge status: Home / Self Care | Payer: Medicare Other | Source: Ambulatory Visit | Attending: Urology | Admitting: Urology

## 2018-02-17 DIAGNOSIS — Z01812 Encounter for preprocedural laboratory examination: Secondary | ICD-10-CM | POA: Diagnosis not present

## 2018-02-17 LAB — COMPREHENSIVE METABOLIC PANEL
ALBUMIN: 3.9 g/dL (ref 3.5–5.0)
ALK PHOS: 47 U/L (ref 38–126)
ALT: 19 U/L (ref 0–44)
AST: 26 U/L (ref 15–41)
Anion gap: 7 (ref 5–15)
BUN: 11 mg/dL (ref 8–23)
CALCIUM: 9.3 mg/dL (ref 8.9–10.3)
CO2: 26 mmol/L (ref 22–32)
CREATININE: 1.08 mg/dL (ref 0.61–1.24)
Chloride: 106 mmol/L (ref 98–111)
GFR calc non Af Amer: >60 mL/min (ref >60)
GLUCOSE: 111 mg/dL — AB (ref 70–99)
Potassium: 3.9 mmol/L (ref 3.5–5.1)
SODIUM: 139 mmol/L (ref 135–145)
Total Bilirubin: 0.6 mg/dL (ref 0.3–1.2)
Total Protein: 7.5 g/dL (ref 6.5–8.1)

## 2018-02-17 LAB — CBC
HEMATOCRIT: 44.9 % (ref 39.0–52.0)
HEMOGLOBIN: 15.1 g/dL (ref 13.0–17.0)
MCH: 32.4 pg (ref 26.0–34.0)
MCHC: 33.6 g/dL (ref 30.0–36.0)
MCV: 96.4 fL (ref 80.0–100.0)
Platelets: 205 10*3/uL (ref 150–400)
RBC: 4.66 MIL/uL (ref 4.22–5.81)
RDW: 12.1 % (ref 11.5–15.5)
WBC: 8.1 10*3/uL (ref 4.0–10.5)
nRBC: 0 % (ref 0.0–0.2)

## 2018-02-17 LAB — PROTIME-INR
INR: 0.91
Prothrombin Time: 12.1 seconds (ref 11.4–15.2)

## 2018-02-17 LAB — APTT: APTT: 31 s (ref 24–36)

## 2018-02-18 ENCOUNTER — Other Ambulatory Visit: Payer: Self-pay

## 2018-02-18 ENCOUNTER — Encounter (HOSPITAL_BASED_OUTPATIENT_CLINIC_OR_DEPARTMENT_OTHER): Payer: Self-pay | Admitting: *Deleted

## 2018-02-18 NOTE — Progress Notes (Signed)
Spoke with patient via telephone for pre op interview. NPO after MN. Can have clear liquids until 0700. Patient verbalized understanding of clear liquid diet and no milk products. No medications AM of surgery. Arrival time 1100. Patient to use Fleets enema AM of surgery. Labs and EKG in Epic and on chart.

## 2018-02-23 ENCOUNTER — Telehealth: Payer: Self-pay | Admitting: *Deleted

## 2018-02-23 NOTE — Telephone Encounter (Signed)
CALLED PATIENT TO REMIND OF PROCEDURE FOR 02-24-18, SPOKE WITH PATIENT AND HE IS AWARE OF THIS PROCEDURE

## 2018-02-24 ENCOUNTER — Ambulatory Visit (HOSPITAL_BASED_OUTPATIENT_CLINIC_OR_DEPARTMENT_OTHER)
Admission: RE | Admit: 2018-02-24 | Discharge: 2018-02-24 | Disposition: A | Discharge status: Home / Self Care | Payer: Medicare Other | Source: Ambulatory Visit | Attending: Urology | Admitting: Urology

## 2018-02-24 ENCOUNTER — Encounter (HOSPITAL_BASED_OUTPATIENT_CLINIC_OR_DEPARTMENT_OTHER): Payer: Self-pay | Admitting: *Deleted

## 2018-02-24 ENCOUNTER — Ambulatory Visit (HOSPITAL_BASED_OUTPATIENT_CLINIC_OR_DEPARTMENT_OTHER): Payer: Medicare Other | Admitting: Anesthesiology

## 2018-02-24 ENCOUNTER — Encounter (HOSPITAL_BASED_OUTPATIENT_CLINIC_OR_DEPARTMENT_OTHER)
Admission: RE | Disposition: A | Discharge status: Home / Self Care | Payer: Self-pay | Source: Ambulatory Visit | Attending: Urology

## 2018-02-24 ENCOUNTER — Ambulatory Visit (HOSPITAL_COMMUNITY): Payer: Medicare Other

## 2018-02-24 DIAGNOSIS — G629 Polyneuropathy, unspecified: Secondary | ICD-10-CM | POA: Insufficient documentation

## 2018-02-24 DIAGNOSIS — Z79899 Other long term (current) drug therapy: Secondary | ICD-10-CM | POA: Insufficient documentation

## 2018-02-24 DIAGNOSIS — G250 Essential tremor: Secondary | ICD-10-CM | POA: Diagnosis not present

## 2018-02-24 DIAGNOSIS — C61 Malignant neoplasm of prostate: Secondary | ICD-10-CM

## 2018-02-24 HISTORY — PX: SPACE OAR INSTILLATION: SHX6769

## 2018-02-24 HISTORY — PX: RADIOACTIVE SEED IMPLANT: SHX5150

## 2018-02-24 SURGERY — INSERTION, RADIATION SOURCE, PROSTATE
Anesthesia: General

## 2018-02-24 MED ORDER — FENTANYL CITRATE (PF) 100 MCG/2ML IJ SOLN
INTRAMUSCULAR | Status: AC
  Filled 2018-02-24: qty 2

## 2018-02-24 MED ORDER — EPHEDRINE SULFATE-NACL 50-0.9 MG/10ML-% IV SOSY
PREFILLED_SYRINGE | INTRAVENOUS | Status: DC | PRN
  Administered 2018-02-24: 10 mg via INTRAVENOUS

## 2018-02-24 MED ORDER — IOHEXOL 300 MG/ML  SOLN
INTRAMUSCULAR | Status: DC | PRN
  Administered 2018-02-24: 7 mL via URETHRAL

## 2018-02-24 MED ORDER — ONDANSETRON HCL 4 MG/2ML IJ SOLN
4.0000 mg | Freq: Once | INTRAMUSCULAR | Status: AC | PRN
  Administered 2018-02-24: 4 mg via INTRAVENOUS
  Filled 2018-02-24: qty 2

## 2018-02-24 MED ORDER — LIDOCAINE 2% (20 MG/ML) 5 ML SYRINGE
INTRAMUSCULAR | Status: DC | PRN
  Administered 2018-02-24: 100 mg via INTRAVENOUS

## 2018-02-24 MED ORDER — FLEET ENEMA 7-19 GM/118ML RE ENEM
1.0000 | ENEMA | Freq: Once | RECTAL | Status: DC
  Filled 2018-02-24: qty 1

## 2018-02-24 MED ORDER — LACTATED RINGERS IV SOLN
INTRAVENOUS | Status: DC
  Administered 2018-02-24 (×2): via INTRAVENOUS
  Filled 2018-02-24: qty 1000

## 2018-02-24 MED ORDER — SODIUM CHLORIDE 0.9 % IV SOLN
INTRAVENOUS | Status: AC | PRN
  Administered 2018-02-24: 1000 mL

## 2018-02-24 MED ORDER — DEXAMETHASONE SODIUM PHOSPHATE 10 MG/ML IJ SOLN
INTRAMUSCULAR | Status: AC
  Filled 2018-02-24: qty 1

## 2018-02-24 MED ORDER — PROPOFOL 10 MG/ML IV BOLUS
INTRAVENOUS | Status: DC | PRN
  Administered 2018-02-24: 30 mg via INTRAVENOUS
  Administered 2018-02-24: 170 mg via INTRAVENOUS

## 2018-02-24 MED ORDER — PHENYLEPHRINE 40 MCG/ML (10ML) SYRINGE FOR IV PUSH (FOR BLOOD PRESSURE SUPPORT)
PREFILLED_SYRINGE | INTRAVENOUS | Status: AC
  Filled 2018-02-24: qty 10

## 2018-02-24 MED ORDER — CEFAZOLIN SODIUM-DEXTROSE 2-4 GM/100ML-% IV SOLN
INTRAVENOUS | Status: AC
  Filled 2018-02-24: qty 100

## 2018-02-24 MED ORDER — EPHEDRINE 5 MG/ML INJ
INTRAVENOUS | Status: AC
  Filled 2018-02-24: qty 10

## 2018-02-24 MED ORDER — DEXAMETHASONE SODIUM PHOSPHATE 4 MG/ML IJ SOLN
INTRAMUSCULAR | Status: DC | PRN
  Administered 2018-02-24: 10 mg via INTRAVENOUS

## 2018-02-24 MED ORDER — FENTANYL CITRATE (PF) 100 MCG/2ML IJ SOLN
25.0000 ug | INTRAMUSCULAR | Status: DC | PRN
  Filled 2018-02-24: qty 1

## 2018-02-24 MED ORDER — OXYCODONE HCL 5 MG/5ML PO SOLN
5.0000 mg | Freq: Once | ORAL | Status: DC | PRN
  Filled 2018-02-24: qty 5

## 2018-02-24 MED ORDER — PROPOFOL 10 MG/ML IV BOLUS
INTRAVENOUS | Status: AC
  Filled 2018-02-24: qty 20

## 2018-02-24 MED ORDER — OXYCODONE HCL 5 MG PO TABS
5.0000 mg | ORAL_TABLET | Freq: Once | ORAL | Status: DC | PRN
  Filled 2018-02-24: qty 1

## 2018-02-24 MED ORDER — ONDANSETRON HCL 4 MG/2ML IJ SOLN
INTRAMUSCULAR | Status: AC
  Filled 2018-02-24: qty 2

## 2018-02-24 MED ORDER — PHENYLEPHRINE 40 MCG/ML (10ML) SYRINGE FOR IV PUSH (FOR BLOOD PRESSURE SUPPORT)
PREFILLED_SYRINGE | INTRAVENOUS | Status: DC | PRN
  Administered 2018-02-24 (×5): 80 ug via INTRAVENOUS

## 2018-02-24 MED ORDER — FENTANYL CITRATE (PF) 100 MCG/2ML IJ SOLN
INTRAMUSCULAR | Status: DC | PRN
  Administered 2018-02-24 (×2): 50 ug via INTRAVENOUS

## 2018-02-24 MED ORDER — CEFAZOLIN SODIUM-DEXTROSE 2-4 GM/100ML-% IV SOLN
2.0000 g | Freq: Once | INTRAVENOUS | Status: AC
  Administered 2018-02-24: 2 g via INTRAVENOUS
  Filled 2018-02-24: qty 100

## 2018-02-24 MED ORDER — SODIUM CHLORIDE 0.9 % IJ SOLN
INTRAMUSCULAR | Status: DC | PRN
  Administered 2018-02-24: 10 mL

## 2018-02-24 SURGICAL SUPPLY — 46 items
BAG URINE DRAINAGE (UROLOGICAL SUPPLIES) ×2 IMPLANT
BLADE CLIPPER SURG (BLADE) ×2 IMPLANT
CATH FOLEY 2WAY SLVR  5CC 16FR (CATHETERS) ×1
CATH FOLEY 2WAY SLVR 5CC 16FR (CATHETERS) ×1 IMPLANT
CATH ROBINSON RED A/P 16FR (CATHETERS) IMPLANT
CATH ROBINSON RED A/P 20FR (CATHETERS) ×2 IMPLANT
CLOTH BEACON ORANGE TIMEOUT ST (SAFETY) ×2 IMPLANT
CONT SPECI 4OZ STER CLIK (MISCELLANEOUS) ×4 IMPLANT
COVER BACK TABLE 60X90IN (DRAPES) ×2 IMPLANT
COVER MAYO STAND STRL (DRAPES) ×2 IMPLANT
DRSG TEGADERM 4X4.75 (GAUZE/BANDAGES/DRESSINGS) ×2 IMPLANT
DRSG TEGADERM 8X12 (GAUZE/BANDAGES/DRESSINGS) ×4 IMPLANT
GAUZE SPONGE 4X4 12PLY STRL (GAUZE/BANDAGES/DRESSINGS) ×2 IMPLANT
GLOVE BIO SURGEON STRL SZ 6 (GLOVE) IMPLANT
GLOVE BIO SURGEON STRL SZ 6.5 (GLOVE) IMPLANT
GLOVE BIO SURGEON STRL SZ7 (GLOVE) IMPLANT
GLOVE BIO SURGEON STRL SZ7.5 (GLOVE) ×4 IMPLANT
GLOVE BIO SURGEON STRL SZ8 (GLOVE) IMPLANT
GLOVE BIOGEL PI IND STRL 6 (GLOVE) IMPLANT
GLOVE BIOGEL PI IND STRL 6.5 (GLOVE) IMPLANT
GLOVE BIOGEL PI IND STRL 7.0 (GLOVE) IMPLANT
GLOVE BIOGEL PI IND STRL 8 (GLOVE) IMPLANT
GLOVE BIOGEL PI INDICATOR 6 (GLOVE)
GLOVE BIOGEL PI INDICATOR 6.5 (GLOVE)
GLOVE BIOGEL PI INDICATOR 7.0 (GLOVE)
GLOVE BIOGEL PI INDICATOR 8 (GLOVE)
GLOVE ECLIPSE 8.0 STRL XLNG CF (GLOVE) ×2 IMPLANT
GOWN STRL REUS W/ TWL LRG LVL3 (GOWN DISPOSABLE) ×1 IMPLANT
GOWN STRL REUS W/ TWL XL LVL3 (GOWN DISPOSABLE) ×1 IMPLANT
GOWN STRL REUS W/TWL LRG LVL3 (GOWN DISPOSABLE) ×1
GOWN STRL REUS W/TWL XL LVL3 (GOWN DISPOSABLE) ×3 IMPLANT
HOLDER FOLEY CATH W/STRAP (MISCELLANEOUS) ×2 IMPLANT
I-SEED AGX100 ×2 IMPLANT
IMPL SPACEOAR SYSTEM 10ML (Spacer) ×1 IMPLANT
IMPLANT SPACEOAR SYSTEM 10ML (Spacer) ×2 IMPLANT
IV NS 1000ML (IV SOLUTION) ×1
IV NS 1000ML BAXH (IV SOLUTION) ×1 IMPLANT
KIT TURNOVER CYSTO (KITS) ×2 IMPLANT
MARKER SKIN DUAL TIP RULER LAB (MISCELLANEOUS) ×2 IMPLANT
PACK CYSTO (CUSTOM PROCEDURE TRAY) ×2 IMPLANT
SURGILUBE 2OZ TUBE FLIPTOP (MISCELLANEOUS) ×2 IMPLANT
SUT BONE WAX W31G (SUTURE) IMPLANT
SYR 10ML LL (SYRINGE) ×2 IMPLANT
TUBE CONNECTING 12X1/4 (SUCTIONS) IMPLANT
UNDERPAD 30X30 (UNDERPADS AND DIAPERS) ×4 IMPLANT
WATER STERILE IRR 500ML POUR (IV SOLUTION) ×2 IMPLANT

## 2018-02-24 NOTE — Discharge Instructions (Signed)
Brachytherapy for Prostate Cancer, Care After °Refer to this sheet in the next few weeks. These instructions provide you with information on caring for yourself after your procedure. Your health care provider may also give you more specific instructions. Your treatment has been planned according to current medical practices, but problems sometimes occur. Call your health care provider if you have any problems or questions after your procedure. °What can I expect after the procedure? °The area behind the scrotum will probably be tender and bruised. For a short period of time you may have: °· Difficulty passing urine. You may need a catheter for a few days to a month. °· Blood in the urine or semen. °· A feeling of constipation because of prostate swelling. °· Frequent feeling of an urgent need to urinate. ° °For a long period of time you may have: °· Inflammation of the rectum. This happens in about 2% of people who have the procedure. °· Erection problems. These vary with age and occur in about 15-40% of men. °· Difficulty urinating. This is caused by scarring in the urethra. °· Diarrhea. ° °Follow these instructions at home: °· Take medicines only as directed by your health care provider. °· You will probably have a catheter in your bladder for several days. You will have blood in the urine bag and should drink a lot of fluids to keep it a light red color. °· Keep all follow-up visits as directed by your health care provider. If you have a catheter, it will be removed during one of these visits. °· Try not to sit directly on the area behind the scrotum. A soft cushion can decrease the discomfort. Ice packs may also be helpful for the discomfort. Do not put ice directly on the skin. °· Shower and wash the area behind the scrotum gently. Do not sit in a tub. °· If you have had the brachytherapy that uses the seeds, limit your close contact with children and pregnant women for 2 months because of the radiation still  in the prostate. After that period of time, the levels drop off quickly. °Get help right away if: °· You have a fever. °· You have chills. °· You have shortness of breath. °· You have chest pain. °· You have thick blood, like tomato juice, in the urine bag. °· Your catheter is blocked so urine cannot get into the bag. Your bladder area or lower abdomen may be swollen. °· There is excessive bleeding from your rectum. It is normal to have a little blood mixed with your stool. °· There is severe discomfort in the treated area that does not go away with pain medicine. °· You have abdominal discomfort. °· You have severe nausea or vomiting. °· You develop any new or unusual symptoms. °This information is not intended to replace advice given to you by your health care provider. Make sure you discuss any questions you have with your health care provider. °Document Released: 05/18/2010 Document Revised: 09/27/2015 Document Reviewed: 10/06/2012 °Elsevier Interactive Patient Education © 2017 [employer] ° °Post Anesthesia Home Care Instructions ° °Activity: °Get plenty of rest for the remainder of the day. A responsible individual must stay with you for 24 hours following the procedure.  °For the next 24 hours, DO NOT: °-Drive a car °-Operate machinery °-Drink alcoholic beverages °-Take any medication unless instructed by your physician °-Make any legal decisions or sign important papers. ° °Meals: °Start with liquid foods such as gelatin or soup. Progress to regular foods   as tolerated. Avoid greasy, spicy, heavy foods. If nausea and/or vomiting occur, drink only clear liquids until the nausea and/or vomiting subsides. Call your physician if vomiting continues. ° °Special Instructions/Symptoms: °Your throat may feel dry or sore from the anesthesia or the breathing tube placed in your throat during surgery. If this causes discomfort, gargle with warm salt water. The discomfort should disappear within 24 hours. ° °If you had  a scopolamine patch placed behind your ear for the management of post- operative nausea and/or vomiting: ° °1. The medication in the patch is effective for 72 hours, after which it should be removed.  Wrap patch in a tissue and discard in the trash. Wash hands thoroughly with soap and water. °2. You may remove the patch earlier than 72 hours if you experience unpleasant side effects which may include dry mouth, dizziness or visual disturbances. °3. Avoid touching the patch. Wash your hands with soap and water after contact with the patch. °  ° °

## 2018-02-24 NOTE — Op Note (Signed)
Preoperative diagnosis: Stage I (T1cNxMx) Prostate cancer Postoperative diagnosis: Same  Procedure: Prostate brachytherapy seed implant, SpaceOar biodegradable gel implant  Cystoscopy  Surgeon: Junious Silk  Radiation oncologist: Tammi Klippel  Anesthesia: Gen.  Indication for procedure: 77 year old with stage I prostate cancer who elected to proceed with prostate brachytherapy.  Findings: On fluoroscopic imaging there was adequate coverage of the prostate. On cystoscopy the urethra appeared normal, the prostatic urethra appeared normal, the trigone and ureteral orifices appeared normal with clear efflux. The bladder mucosa appeared normal. There were no stones, foreign bodies or seeds visualized in the bladder.  Dose: 145 Gy (26 catheters, 73 active sources)   Description of procedure: After consent was obtained patient brought to the operating room. After adequate anesthesia he is placed in lithotomy position and the transrectal ultrasound probe and perineal template positioned. Catheters and brachytherapy seeds were placed per Dr. Johny Shears plan. A total of 26 catheters and 73 active sources (I-125) were placed. The anchoring needles, template and ultrasound were removed.  The 18-gauge needle was then inserted approximately 1 to 2 cm anterior to the anal opening and directed under fluoroscopic guidance into the perirectal fat between the anterior rectal wall and the prostate capsule down to the mid-gland. Midline needle position was confirmed in the sagittal and axial views to verify the tip was in the perirectal fat.  Small amounts of saline were injected to hydrodissect the space between the prostate and the anterior rectal wall.  Axial imaging was viewed to confirm the needle was in the correct location in the mid gland and centered.  Aspiration confirmed no intravascular access.  The saline syringe was carefully disconnected maintaining the desired needle position and the hydrogel was  attached to the needle.  Under ultrasound guidance in the sagittal view a smooth continuous injection was done over about 12 seconds delivering the hydrogel into the space between the prostate and rectal wall.  The needle was withdrawn.   Scout flouro imaging was obtained of the implant. The Foley was removed. The patient was prepped again and cystoscopy was performed which was noted to be normal. A 16 French catheter was placed and left gravity drainage. He was awakened taken to the recovery room in stable condition.  Complications: None  Blood loss: Minimal  Specimens: None  Drains: None   Disposition: Patient stable to PACU.

## 2018-02-24 NOTE — H&P (Addendum)
H&P  Chief Complaint: Prostate cancer  History of Present Illness: 77 year old male with a history of PSA elevation to 9.8.  On prostate biopsy he had Gleason 4+3 = 7 disease in the left base in the left mid had 20% and 5% of the core respectively.  His prostate was 72 g and he started finasteride.  Prostate decreased to 44 g in size and PSA of 4.96.  He was thought to be an adequate candidate for brachytherapy.  He presents today for brachytherapy and has been well.  He's had no no congestion, dysuria, gross hematuria or fever.  Past Medical History:  Diagnosis Date  . Back pain   . Elevated PSA   . Essential tremor   . Hypercholesteremia   . Neuropathy   . Prostate cancer ([facility])   . Sinus tachycardia 01/30/2018   Past Surgical History:  Procedure Laterality Date  . LUMBAR MICRODISCECTOMY     L4-5    Home Medications:  Medications Prior to Admission  Medication Sig Dispense Refill Last Dose  . calcium-vitamin D (OSCAL WITH D) 500-200 MG-UNIT tablet Take 1 tablet by mouth.   Past Month at Unknown time  . finasteride (PROSCAR) 5 MG tablet Take 5 mg by mouth daily.   02/23/2018 at Unknown time  . vitamin B-12 (CYANOCOBALAMIN) 1000 MCG tablet Take 1,000 mcg by mouth daily.   Past Month at Unknown time   Allergies: No Known Allergies  Family History  Problem Relation Age of Onset  . Other Mother        died at 64 of natural causes  . Other Father        unsure of health  . Cancer Neg Hx    Social History:  reports that he has never smoked. He has never used smokeless tobacco. He reports that he does not drink alcohol or use drugs.  ROS: A complete review of systems was performed.  All systems are negative except for pertinent findings as noted. Review of Systems  All other systems reviewed and are negative.    Physical Exam:  Vital signs in last 24 hours: Temp:  [97.7 F (36.5 C)] 97.7 F (36.5 C) (10/29 1117) Pulse Rate:  [107] 107 (10/29 1117) Resp:  [16] 16 (10/29  1117) BP: (153)/(92) 153/92 (10/29 1117) SpO2:  [99 %] 99 % (10/29 1117) Weight:  [64 kg] 64 kg (10/29 1117) General:  Alert and oriented, No acute distress HEENT: Normocephalic, atraumatic Cardiovascular: Regular rate and rhythm Lungs: Regular rate and effort Abdomen: Soft, nontender, nondistended, no abdominal masses Back: No CVA tenderness Extremities: No edema Neurologic: Grossly intact  Laboratory Data:  No results found for this or any previous visit (from the past 24 hour(s)). No results found for this or any previous visit (from the past 240 hour(s)). Creatinine: No results for input(s): CREATININE in the last 168 hours.  Impression/Assessment/plan:  I discussed with the patient and his wife and son the nature, potential benefits, risks and alternatives to brachytherapy and SpaceOar, including side effects of the proposed treatment, the likelihood of the patient achieving the goals of the procedure, and any potential problems that might occur during the procedure or recuperation. All questions answered. Patient elects to proceed.    Festus Aloe 02/24/2018, 1:35 PM

## 2018-02-24 NOTE — Anesthesia Preprocedure Evaluation (Addendum)
Anesthesia Evaluation  Patient identified by MRN, date of birth, ID band Patient awake    Reviewed: Allergy & Precautions, NPO status , Patient's Chart, lab work & pertinent test results  History of Anesthesia Complications Negative for: history of anesthetic complications  Airway Mallampati: I  TM Distance: >3 FB Neck ROM: Full    Dental no notable dental hx.    Pulmonary neg pulmonary ROS,    Pulmonary exam normal        Cardiovascular negative cardio ROS Normal cardiovascular exam  Sinus tachycardia   Neuro/Psych negative neurological ROS  negative psych ROS   GI/Hepatic negative GI ROS, Neg liver ROS,   Endo/Other  negative endocrine ROS  Renal/GU negative Renal ROS  negative genitourinary   Musculoskeletal negative musculoskeletal ROS (+)   Abdominal   Peds  Hematology negative hematology ROS (+)   Anesthesia Other Findings   Reproductive/Obstetrics                            Anesthesia Physical Anesthesia Plan  ASA: I  Anesthesia Plan: General   Post-op Pain Management:    Induction: Intravenous  PONV Risk Score and Plan: 3 and Ondansetron, Dexamethasone and Treatment may vary due to age or medical condition  Airway Management Planned: LMA  Additional Equipment: None  Intra-op Plan:   Post-operative Plan: Extubation in OR  Informed Consent: I have reviewed the patients History and Physical, chart, labs and discussed the procedure including the risks, benefits and alternatives for the proposed anesthesia with the patient or authorized representative who has indicated his/her understanding and acceptance.     Plan Discussed with:   Anesthesia Plan Comments:        Anesthesia Quick Evaluation

## 2018-02-24 NOTE — Transfer of Care (Signed)
Last Vitals:  Vitals Value Taken Time  BP 146/91 02/24/2018  3:36 PM  Temp    Pulse 81 02/24/2018  3:42 PM  Resp 19 02/24/2018  3:42 PM  SpO2 100 % 02/24/2018  3:42 PM  Vitals shown include unvalidated device data.  Last Pain:  Vitals:   02/24/18 1117  TempSrc: Oral     Immediate Anesthesia Transfer of Care Note  Patient: Jeff Willis  Procedure(s) Performed: Procedure(s) (LRB): RADIOACTIVE SEED IMPLANT/BRACHYTHERAPY IMPLANT (N/A) SPACE OAR INSTILLATION (N/A)  Patient Location: PACU  Anesthesia Type: General  Level of Consciousness: awake, alert  and oriented  Airway & Oxygen Therapy: Patient Spontanous Breathing and Patient connected to nasal cannula oxygen  Post-op Assessment: Report given to PACU RN and Post -op Vital signs reviewed and stable  Post vital signs: Reviewed and stable  Complications: No apparent anesthesia complications

## 2018-02-25 ENCOUNTER — Encounter (HOSPITAL_BASED_OUTPATIENT_CLINIC_OR_DEPARTMENT_OTHER): Payer: Self-pay | Admitting: Urology

## 2018-02-25 NOTE — Anesthesia Postprocedure Evaluation (Signed)
Anesthesia Post Note  Patient: Pratik G Cranford  Procedure(s) Performed: RADIOACTIVE SEED IMPLANT/BRACHYTHERAPY IMPLANT (N/A ) SPACE OAR INSTILLATION (N/A )     Patient location during evaluation: PACU Anesthesia Type: General Level of consciousness: awake and alert Pain management: pain level controlled Vital Signs Assessment: post-procedure vital signs reviewed and stable Respiratory status: spontaneous breathing, nonlabored ventilation and respiratory function stable Cardiovascular status: blood pressure returned to baseline and stable Postop Assessment: no apparent nausea or vomiting Anesthetic complications: no    Last Vitals:  Vitals:   02/24/18 1615 02/24/18 1700  BP: (!) 134/98 (!) 142/97  Pulse: 79 89  Resp: 13 16  Temp:  36.4 C  SpO2: 99%     Last Pain:  Vitals:   02/24/18 1117  TempSrc: Oral   Pain Goal:                 Lynda Rainwater

## 2018-02-28 NOTE — Progress Notes (Signed)
  Radiation Oncology         [phone] ________________________________  Name: Jeff Willis MRN: [id number]  Date: 02/28/2018  DOB: April 17, 1941       Prostate Seed Implant  GT:XMIWOEH, [name], MD  No ref. provider found  DIAGNOSIS: 77 y.o. gentleman with Stage T1c adenocarcinoma of the prostate with Gleason Score of 4+3, and PSA of 9.8    ICD-10-CM   1. Malignant neoplasm of prostate [facility]) [place] Discharge patient    PROCEDURE: Insertion of radioactive I-125 seeds into the prostate gland.  RADIATION DOSE: 145 Gy, definitive therapy.  TECHNIQUE: [name] was brought to the operating room with the urologist. He was placed in the dorsolithotomy position. He was catheterized and a rectal tube was inserted. The perineum was shaved, prepped and draped. The ultrasound probe was then introduced into the rectum to see the prostate gland.  TREATMENT DEVICE: A needle grid was attached to the ultrasound probe stand and anchor needles were placed.  3D PLANNING: The prostate was imaged in 3D using a sagittal sweep of the prostate probe. These images were transferred to the planning computer. There, the prostate, urethra and rectum were defined on each axial reconstructed image. Then, the software created an optimized 3D plan and a few seed positions were adjusted. The quality of the plan was reviewed using [place] information for the target and the following two organs at risk:  Urethra and Rectum.  Then the accepted plan was printed and handed off to the radiation therapist.  Under my supervision, the custom loading of the seeds and spacers was carried out and loaded into sealed vicryl sleeves.  These pre-loaded needles were then placed into the needle holder.Marland Kitchen  PROSTATE VOLUME STUDY:  Using transrectal ultrasound the volume of the prostate was verified to be 40.3 cc.  SPECIAL TREATMENT PROCEDURE/SUPERVISION AND HANDLING: The pre-loaded needles were then delivered under sagittal  guidance. A total of 26 needles were used to deposit 73 seeds in the prostate gland. The individual seed activity was 0.462 mCi.  SpaceOAR:  Yes  COMPLEX SIMULATION: At the end of the procedure, an anterior radiograph of the pelvis was obtained to document seed positioning and count. Cystoscopy was performed to check the urethra and bladder.  MICRODOSIMETRY: At the end of the procedure, the patient was emitting 0.11 mR/hr at 1 meter. Accordingly, he was considered safe for hospital discharge.  PLAN: The patient will return to the radiation oncology clinic for post implant CT dosimetry in three weeks.   ________________________________  Sheral Apley Tammi Klippel, M.D.

## 2018-03-09 ENCOUNTER — Telehealth: Payer: Self-pay | Admitting: *Deleted

## 2018-03-09 NOTE — Telephone Encounter (Signed)
Returned patient's phone call, lvm 

## 2018-03-10 ENCOUNTER — Telehealth: Payer: Self-pay | Admitting: *Deleted

## 2018-03-10 DIAGNOSIS — C61 Malignant neoplasm of prostate: Secondary | ICD-10-CM | POA: Diagnosis not present

## 2018-03-10 NOTE — Telephone Encounter (Signed)
CALLED PATIENT TO REMIND OF POST SEED APPTS. AND MRI FOR 03-11-18, SPOKE WITH PATIENT AND HE IS AWARE OF THESE APPTS.

## 2018-03-11 ENCOUNTER — Ambulatory Visit
Admission: RE | Admit: 2018-03-11 | Discharge: 2018-03-11 | Disposition: A | Discharge status: Home / Self Care | Payer: Medicare Other | Source: Ambulatory Visit | Attending: Radiation Oncology | Admitting: Radiation Oncology

## 2018-03-11 ENCOUNTER — Encounter: Payer: Self-pay | Admitting: Radiation Oncology

## 2018-03-11 ENCOUNTER — Ambulatory Visit (HOSPITAL_COMMUNITY)
Admission: RE | Admit: 2018-03-11 | Discharge: 2018-03-11 | Disposition: A | Discharge status: Home / Self Care | Payer: Medicare Other | Source: Ambulatory Visit | Attending: Urology | Admitting: Urology

## 2018-03-11 ENCOUNTER — Other Ambulatory Visit: Payer: Self-pay | Admitting: Urology

## 2018-03-11 ENCOUNTER — Other Ambulatory Visit: Payer: Self-pay

## 2018-03-11 VITALS — BP 158/84 | HR 114 | Temp 97.9°F | Resp 18 | Ht 67.0 in | Wt 141.8 lb

## 2018-03-11 DIAGNOSIS — C61 Malignant neoplasm of prostate: Secondary | ICD-10-CM | POA: Insufficient documentation

## 2018-03-11 MED ORDER — TAMSULOSIN HCL 0.4 MG PO CAPS: 0.4000 mg | ORAL_CAPSULE | Freq: Every day | ORAL | 0 refills | Status: DC

## 2018-03-11 NOTE — Progress Notes (Signed)
Radiation Oncology         [phone] ________________________________  Name: Jeff Willis MRN: [id number]  Date: 03/11/2018  DOB: 28-Apr-1941  Post-Seed Follow-Up Visit Note  CC: Jeff Arabian, MD  Jeff Aloe, MD  Diagnosis:   77 y.o. gentleman with Stage T1c adenocarcinoma of the prostate with Gleason Score of 4+3, and PSA of 9.8.    ICD-10-CM   1. Malignant neoplasm of prostate ([place]) C61     Interval Since Last Radiation:  2 weeks 02/24/18: Insertion of radioactive I-125 seeds into the prostate gland: 145 Gy, definitive therapy with placement of SpaceOAR gel.  Narrative:  The patient returns today for routine follow-up.  He is complaining of increased urinary frequency and urinary hesitation symptoms. He filled out a questionnaire regarding urinary function today providing and overall IPSS score of 15 characterizing his symptoms as moderate with gradually improving intermittency, urgency, daytime frequency, weak stream, incomplete emptying and nocturia x2 per night.  He specifically denies dysuria, gross hematuria or incontinence.  His pre-implant score was 3. He reports a healthy appetite and is maintaining his weight.  He denies abdominal pain, nausea, vomiting, constipation or diarrhea.  He is quite pleased with his progress to date.  He was recently seen at [facility]rology and has a follow-up visit scheduled with Jeff Willis in March 2020.  He will be traveling to [place] by car this weekend to celebrate the wedding of his nephew.  He will also be returning to [place] in December 2019 and returning to the states in March, prior to his visit with Jeff Willis.  ALLERGIES:  has No Known Allergies.  Meds: Current Outpatient Medications  Medication Sig Dispense Refill  . finasteride (PROSCAR) 5 MG tablet Take 5 mg by mouth daily.    . vitamin B-12 (CYANOCOBALAMIN) 1000 MCG tablet Take 1,000 mcg by mouth daily.    . tamsulosin (FLOMAX) 0.4 MG CAPS capsule Take  1 capsule (0.4 mg total) by mouth daily after supper. 30 capsule 0   No current facility-administered medications for this encounter.     Physical Findings: In general this is a well appearing [place] male in no acute distress. He's alert and oriented x4 and appropriate throughout the examination. Cardiopulmonary assessment is negative for acute distress and he exhibits normal effort.   Lab Findings: Lab Results  Component Value Date   WBC 8.1 02/17/2018   HGB 15.1 02/17/2018   HCT 44.9 02/17/2018   MCV 96.4 02/17/2018   PLT 205 02/17/2018    Radiographic Findings:  Patient underwent CT imaging in our clinic for post implant dosimetry. The CT was reviewed by Dr. Tammi Klippel and appears to demonstrate an adequate distribution of radioactive seeds throughout the prostate gland. There are no seeds in or near the rectum. We suspect the final radiation plan and dosimetry will show appropriate coverage of the prostate gland.   Impression/Plan: The patient is recovering from the effects of radiation. His urinary symptoms should gradually improve over the next 4-6 months. We talked about this today. He is encouraged by his improvement already and is otherwise pleased with his outcome.  For his upcoming travels, I have provided a prescription for Flomax to use as needed for management of lower urinary tract symptoms.  We also talked about long-term follow-up for prostate cancer following seed implant. He understands that ongoing PSA determinations and digital rectal exams will help perform surveillance to rule out disease recurrence. He has a follow up appointment scheduled with Jeff Willis  in March 2020. He understands what to expect with his PSA measures. Patient was also educated today about some of the long-term effects from radiation including a small risk for rectal bleeding and possibly erectile dysfunction. We talked about some of the general management approaches to these potential complications.  However, I did encourage the patient to contact our office or return at any point if he has questions or concerns related to his previous radiation and prostate cancer.    Nicholos Johns, PA-C  This document serves as a record of services personally performed by [employer], PA-C. It was created on her behalf by Wilburn Mylar, a trained medical scribe. The creation of this record is based on the scribe's personal observations and the provider's statements to them. This document has been checked and approved by the attending provider.

## 2018-03-11 NOTE — Progress Notes (Signed)
Post Seed Visit  Denies having fatigue Denies having pain. Denies having dysuria hematuria or diarrhea. Incomplete emptying  Bladder:Less than half of the time. Frequency: Less than half of the time. Intermitency:About half of the time Urgency:Less than half of the time Weak Stream:Less than half of the time Straining:Stream:Less than half of the time Nocturia:Less than half of the time  Wt Readings from Last 3 Encounters:  03/11/18 141 lb 12.8 oz (64.3 kg)  02/24/18 141 lb 3.2 oz (64 kg)  10/08/17 138 lb 12.8 oz (63 kg)  BP (!) 158/84 (BP Location: Right Arm, Patient Position: Sitting)   Pulse (!) 114   Temp 97.9 F (36.6 C) (Oral)   Resp 18   Ht 5\' 7"  (1.702 m)   Wt 141 lb 12.8 oz (64.3 kg)   SpO2 100%   BMI 22.21 kg/m

## 2018-03-11 NOTE — Progress Notes (Signed)
  Radiation Oncology         [phone] ________________________________  Name: Jeff Willis MRN: [id number]  Date: 03/11/2018  DOB: 14-Oct-1940  COMPLEX SIMULATION NOTE  NARRATIVE:  The patient was brought to the [facility] today following prostate seed implantation approximately two weeks ago.  Identity was confirmed.  All relevant records and images related to the planned course of therapy were reviewed.  Then, the patient was set-up supine.  CT images were obtained.  The CT images were loaded into the planning software.  Then the prostate and rectum were contoured.  Treatment planning then occurred.  The implanted iridium-125 seeds were identified by the physics staff for projection of radiation distribution  I have requested : 3D Simulation  I have requested a DVH of the following structures: Prostate and rectum.    ________________________________  Sheral Apley Tammi Klippel, M.D.  This document serves as a record of services personally performed by [name], MD. It was created on his behalf by Wilburn Mylar, a trained medical scribe. The creation of this record is based on the scribe's personal observations and the provider's statements to them. This document has been checked and approved by the attending provider.

## 2018-03-12 ENCOUNTER — Encounter: Payer: Self-pay | Admitting: Medical Oncology

## 2018-03-16 ENCOUNTER — Encounter: Payer: Self-pay | Admitting: Radiation Oncology

## 2018-03-16 DIAGNOSIS — C61 Malignant neoplasm of prostate: Secondary | ICD-10-CM | POA: Diagnosis not present

## 2018-03-25 NOTE — Progress Notes (Signed)
  Radiation Oncology         [phone] ________________________________  Name: EDGERRIN CORREIA MRN: [id number]  Date: 03/16/2018  DOB: 1941-02-08  3D Planning Note   Prostate Brachytherapy Post-Implant Dosimetry  Diagnosis: 77 y.o. gentleman with Stage T1c adenocarcinoma of the prostate with Gleason Score of 4+3, and PSA of 9.8.   Narrative: On a previous date, KAULDER ZAHNER returned following prostate seed implantation for post implant planning. He underwent CT scan complex simulation to delineate the three-dimensional structures of the pelvis and demonstrate the radiation distribution.  Since that time, the seed localization, and complex isodose planning with dose volume histograms have now been completed.  Results:   Prostate Coverage - The dose of radiation delivered to the 90% or more of the prostate gland (D90) was 107.18% of the prescription dose. This exceeds our goal of greater than 90%. Rectal Sparing - The volume of rectal tissue receiving the prescription dose or higher was 0.0 cc. This falls under our thresholds tolerance of 1.0 cc.  Impression: The prostate seed implant appears to show adequate target coverage and appropriate rectal sparing.  Plan:  The patient will continue to follow with urology for ongoing PSA determinations. I would anticipate a high likelihood for local tumor control with minimal risk for rectal morbidity.  ________________________________  Sheral Apley Tammi Klippel, M.D.

## 2018-04-01 ENCOUNTER — Telehealth: Payer: Self-pay | Admitting: *Deleted

## 2018-04-01 ENCOUNTER — Other Ambulatory Visit: Payer: Self-pay | Admitting: Urology

## 2018-04-01 MED ORDER — TAMSULOSIN HCL 0.4 MG PO CAPS: 0.4000 mg | ORAL_CAPSULE | Freq: Every day | ORAL | 2 refills | Status: DC

## 2018-04-01 NOTE — Telephone Encounter (Signed)
CALLED PATIENT TO INFORM THAT SCRIPT WOULD BE READY FOR PICK-UP ON 04-01-18 @ [address] SPOKE WITH PATIENT AND HE IS AWARE OF THIS

## 2018-04-07 ENCOUNTER — Telehealth: Payer: Self-pay | Admitting: *Deleted

## 2018-04-07 NOTE — Telephone Encounter (Signed)
RETURNED PATIENT'S PHONE CALL, LVM FOR A RETURN CALL 

## 2018-04-08 ENCOUNTER — Other Ambulatory Visit: Payer: Self-pay | Admitting: Urology

## 2018-04-08 MED ORDER — TAMSULOSIN HCL 0.4 MG PO CAPS: 0.4000 mg | ORAL_CAPSULE | Freq: Every day | ORAL | 2 refills | Status: DC

## 2018-08-12 DIAGNOSIS — M25562 Pain in left knee: Secondary | ICD-10-CM | POA: Insufficient documentation

## 2019-01-28 ENCOUNTER — Other Ambulatory Visit: Payer: Self-pay

## 2019-01-28 DIAGNOSIS — I739 Peripheral vascular disease, unspecified: Secondary | ICD-10-CM

## 2019-02-04 ENCOUNTER — Telehealth (HOSPITAL_COMMUNITY): Payer: Self-pay

## 2019-02-04 NOTE — Telephone Encounter (Signed)

## 2019-02-05 ENCOUNTER — Encounter: Payer: Self-pay | Admitting: Vascular Surgery

## 2019-02-05 ENCOUNTER — Other Ambulatory Visit: Payer: Self-pay

## 2019-02-05 ENCOUNTER — Ambulatory Visit (INDEPENDENT_AMBULATORY_CARE_PROVIDER_SITE_OTHER): Payer: Medicare Other | Admitting: Vascular Surgery

## 2019-02-05 ENCOUNTER — Ambulatory Visit (HOSPITAL_COMMUNITY)
Admission: RE | Admit: 2019-02-05 | Discharge: 2019-02-05 | Disposition: A | Discharge status: Home / Self Care | Payer: Medicare Other | Source: Ambulatory Visit | Attending: Vascular Surgery | Admitting: Vascular Surgery

## 2019-02-05 VITALS — BP 148/89 | HR 100 | Temp 97.6°F | Resp 20 | Ht 67.0 in | Wt 140.0 lb

## 2019-02-05 DIAGNOSIS — M25561 Pain in right knee: Secondary | ICD-10-CM | POA: Diagnosis not present

## 2019-02-05 DIAGNOSIS — M25562 Pain in left knee: Secondary | ICD-10-CM | POA: Diagnosis not present

## 2019-02-05 DIAGNOSIS — I739 Peripheral vascular disease, unspecified: Secondary | ICD-10-CM | POA: Diagnosis present

## 2019-02-05 NOTE — Progress Notes (Signed)
Patient ID: ROSIE BLANCHETTE, male   DOB: Oct 17, 1940, 78 y.o.   MRN: [id number]  Reason for Consult: New Patient (Initial Visit)   Referred by Gaynelle Arabian, MD  Subjective:     HPI:  ANUJ GUNNOE is a 78 y.o. male without significant vascular history.  Does have some back pain and some numbness radiating down his legs.  Has some right foot pain and numbness as well.  Was told by home health nursing that he had decreased blood flow.  Patient remains very active.  He has never been a smoker.  He is a vegetarian.  Does not really have any risk factors for vascular disease.  Past Medical History:  Diagnosis Date  . Back pain   . Elevated PSA   . Essential tremor   . Hypercholesteremia   . Neuropathy   . Prostate cancer ([place])   . Sinus tachycardia 01/30/2018   Family History  Problem Relation Age of Onset  . Other Mother        died at 106 of natural causes  . Other Father        unsure of health  . Cancer Neg Hx    Past Surgical History:  Procedure Laterality Date  . LUMBAR MICRODISCECTOMY     L4-5  . RADIOACTIVE SEED IMPLANT N/A 02/24/2018   Procedure: RADIOACTIVE SEED IMPLANT/BRACHYTHERAPY IMPLANT;  Surgeon: Festus Aloe, MD;  Location: [facility];  Service: Urology;  Laterality: N/A;  . SPACE OAR INSTILLATION N/A 02/24/2018   Procedure: SPACE OAR INSTILLATION;  Surgeon: Festus Aloe, MD;  Location: [facility];  Service: Urology;  Laterality: N/A;    Short Social History:  Social History   Tobacco Use  . Smoking status: Never Smoker  . Smokeless tobacco: Never Used  Substance Use Topics  . Alcohol use: No    No Known Allergies  Current Outpatient Medications  Medication Sig Dispense Refill  . aspirin EC 81 MG tablet Take by mouth.    . gabapentin (NEURONTIN) 100 MG capsule Take by mouth.    Marland Kitchen omeprazole (PRILOSEC) 40 MG capsule Take by mouth.    . vitamin B-12 (CYANOCOBALAMIN) 1000 MCG tablet Take 1,000  mcg by mouth daily.     No current facility-administered medications for this visit.     Review of Systems  Constitutional:  Constitutional negative. HENT: HENT negative.  Eyes: Eyes negative.  Cardiovascular: Cardiovascular negative.  GI: Gastrointestinal negative.  Musculoskeletal: Positive for leg pain.  Skin: Skin negative.  Neurological: Positive for numbness.  Hematologic: Hematologic/lymphatic negative.  Psychiatric: Psychiatric negative.        Objective:  Objective   There were no vitals filed for this visit. There is no height or weight on file to calculate BMI.  Physical Exam HENT:     Head: Normocephalic.     Mouth/Throat:     Mouth: Mucous membranes are moist.  Eyes:     Pupils: Pupils are equal, round, and reactive to light.  Neck:     Musculoskeletal: Normal range of motion and neck supple. No neck rigidity.  Cardiovascular:     Rate and Rhythm: Normal rate and regular rhythm.     Pulses:          Radial pulses are 2+ on the right side and 2+ on the left side.       Popliteal pulses are 2+ on the right side and 2+ on the left side.       Dorsalis  pedis pulses are 2+ on the right side and 2+ on the left side.       Posterior tibial pulses are 2+ on the right side and 2+ on the left side.  Abdominal:     General: Abdomen is flat.     Palpations: Abdomen is soft. There is no mass.  Musculoskeletal: Normal range of motion.        General: No swelling.  Skin:    General: Skin is warm and dry.  Neurological:     General: No focal deficit present.     Mental Status: He is alert.  Psychiatric:        Mood and Affect: Mood normal.        Behavior: Behavior normal.        Thought Content: Thought content normal.        Judgment: Judgment normal.     Data: I have independently interpreted his ABIs which are 1.1 right 1.07 left and triphasic bilaterally     Assessment/Plan:     78 year old male sent for evaluation peripheral vascular disease.  He  has palpable distal pulses ABIs greater than 1 bilaterally.  Feet appear very well perfused.  Does not need vascular invention at this time and given that he has essentially no risk factors I would think he can follow-up on an as-needed basis.     Waynetta Sandy MD [facility]

## 2019-03-08 ENCOUNTER — Other Ambulatory Visit: Payer: Self-pay | Admitting: Otolaryngology

## 2019-03-08 DIAGNOSIS — R059 Cough, unspecified: Secondary | ICD-10-CM

## 2019-03-08 DIAGNOSIS — R05 Cough: Secondary | ICD-10-CM

## 2019-03-15 ENCOUNTER — Ambulatory Visit
Admission: RE | Admit: 2019-03-15 | Discharge: 2019-03-15 | Disposition: A | Discharge status: Home / Self Care | Payer: Medicare Other | Source: Ambulatory Visit | Attending: Otolaryngology | Admitting: Otolaryngology

## 2019-03-15 DIAGNOSIS — R05 Cough: Secondary | ICD-10-CM

## 2019-03-15 DIAGNOSIS — R059 Cough, unspecified: Secondary | ICD-10-CM

## 2019-04-16 ENCOUNTER — Ambulatory Visit: Payer: Medicare Other | Attending: Internal Medicine

## 2019-04-16 DIAGNOSIS — Z20822 Contact with and (suspected) exposure to covid-19: Secondary | ICD-10-CM

## 2019-04-18 LAB — NOVEL CORONAVIRUS, NAA: SARS-CoV-2, NAA: NOT DETECTED

## 2019-07-23 ENCOUNTER — Ambulatory Visit: Payer: Medicare Other | Attending: Internal Medicine

## 2019-07-23 DIAGNOSIS — Z23 Encounter for immunization: Secondary | ICD-10-CM

## 2019-07-23 NOTE — Progress Notes (Signed)
   Covid-19 Vaccination Clinic  Name:  Jeff Willis    MRN: [id number] DOB: [birth year]  07/23/2019  [name] was observed post Covid-19 immunization for 15 minutes without incident. He was provided with Vaccine Information Sheet and instruction to access the V-Safe system.   [name] was instructed to call 911 with any severe reactions post vaccine: Marland Kitchen Difficulty breathing  . Swelling of face and throat  . A fast heartbeat  . A bad rash all over body  . Dizziness and weakness   Immunizations Administered    Name Date Dose VIS Date Route   Pfizer COVID-19 Vaccine 07/23/2019 12:19 PM 0.3 mL 04/09/2019 Intramuscular   Manufacturer: [place]   Lot: [id number]   [place]: [id number]

## 2019-08-04 ENCOUNTER — Other Ambulatory Visit: Payer: Self-pay

## 2019-08-04 ENCOUNTER — Encounter: Payer: Self-pay | Admitting: Internal Medicine

## 2019-08-04 ENCOUNTER — Ambulatory Visit: Payer: Medicare Other | Admitting: Internal Medicine

## 2019-08-04 DIAGNOSIS — C61 Malignant neoplasm of prostate: Secondary | ICD-10-CM

## 2019-08-04 DIAGNOSIS — R05 Cough: Secondary | ICD-10-CM | POA: Diagnosis not present

## 2019-08-04 DIAGNOSIS — M461 Sacroiliitis, not elsewhere classified: Secondary | ICD-10-CM | POA: Diagnosis not present

## 2019-08-04 DIAGNOSIS — I739 Peripheral vascular disease, unspecified: Secondary | ICD-10-CM

## 2019-08-04 DIAGNOSIS — R058 Other specified cough: Secondary | ICD-10-CM | POA: Insufficient documentation

## 2019-08-04 MED ORDER — PANTOPRAZOLE SODIUM 40 MG PO TBEC: 40.0000 mg | DELAYED_RELEASE_TABLET | Freq: Every day | ORAL | 2 refills | Status: DC

## 2019-08-04 MED ORDER — PREDNISONE 10 MG PO TABS: ORAL_TABLET | ORAL | 0 refills | Status: DC

## 2019-08-04 MED ORDER — GABAPENTIN 100 MG PO CAPS: 100.0000 mg | ORAL_CAPSULE | Freq: Four times a day (QID) | ORAL | Status: DC

## 2019-08-04 MED ORDER — FAMOTIDINE 20 MG PO TABS: ORAL_TABLET | ORAL | 11 refills | Status: DC

## 2019-08-04 NOTE — Assessment & Plan Note (Addendum)
Onset in 2019, recurred in summer 2020 but lifelong globus/diffiulty swallowing pills  - neg w/u by ENT Erik Obey) since fall [id number]  - [id number] rec cyclical cough rx with gabapentin and  pred x 6 days/ max gerd rx   The most common causes of chronic cough in immunocompetent adults include the following: upper airway cough syndrome (UACS), previously referred to as postnasal drip syndrome (PNDS), which is caused by variety of rhinosinus conditions; (2) asthma; (3) GERD; (4) chronic bronchitis from cigarette smoking or other inhaled environmental irritants; (5) nonasthmatic eosinophilic bronchitis; and (6) bronchiectasis.   These conditions, singly or in combination, have accounted for up to 94% of the causes of chronic cough in prospective studies.   Other conditions have constituted no >6% of the causes in prospective studies These have included bronchogenic carcinoma, chronic interstitial pneumonia, sarcoidosis, left ventricular failure, ACEI-induced cough, and aspiration from a condition associated with pharyngeal dysfunction.    Chronic cough is often simultaneously caused by more than one condition. A single cause has been found from 38 to 82% of the time, multiple causes from 18 to 62%. Multiply caused cough has been the result of three diseases up to 42% of the time.       Most likely this is not asthma (no noct spells, no assoc pnds) despite prev report of ? Response to "inhaler" but rather Upper airway cough syndrome (previously labeled PNDS),  is so named because it's frequently impossible to sort out how much is  CR/sinusitis with freq throat clearing (which can be related to primary GERD)   vs  causing  secondary (" extra esophageal")  GERD from wide swings in gastric pressure that occur with throat clearing, often  promoting self use of mint and menthol lozenges that reduce the lower esophageal sphincter tone and exacerbate the problem further in a cyclical fashion.   These are the same  pts (now being labeled as having "irritable larynx syndrome" by some cough centers) who not infrequently have a history of having failed to tolerate ace inhibitors,  dry powder inhalers or biphosphonates or report having atypical/extraesophageal reflux symptoms that don't respond to standard doses of PPI  and are easily confused as having aecopd or asthma flares by even experienced allergists/ pulmonologists (myself included).  In any case, Of the three most common causes of  Sub-acute / recurrent or chronic cough, only one (GERD)  can actually contribute to/ trigger  the other two (asthma and post nasal drip syndrome)  and perpetuate the cylce of cough.  While not intuitively obvious, many patients with chronic low grade reflux do not cough until there is a primary insult that disturbs the protective epithelial barrier and exposes sensitive nerve endings.   This is typically viral but can due to PNDS and  either may apply here.   >>> The point is that once this occurs, it is difficult to eliminate the cycle  using anything but a maximally effective acid suppression regimen at least in the short run, accompanied by an appropriate diet to address non acid GERD and control / eliminate the cough itself with gabapentin 100 try qid if tol plus  added 6 days of Prednisone in case of component of Th-2 driven upper or lower airways inflammation (if cough responds short term only to relapse befor return while will on rx for uacs that would point to allergic rhinitis/ asthma or eos bronchitis) .  Advised The standardized cough guidelines published in Chest by Lissa Morales in 2006 are  still the best available and consist of a multiple step process (up to 12!) , not a single office visit,  and are intended  to address this problem logically,  with an alogrithm dependent on response to empiric treatment at  each progressive step  to determine a specific diagnosis with  minimal addtional testing needed. Therefore if  adherence is an issue or can't be accurately verified,  it's very unlikely the standard evaluation and treatment will be successful here.    Furthermore, response to therapy (other than acute cough suppression, which should only be used short term with avoidance of narcotic containing cough syrups if possible), can be a gradual process for which the patient is not likely to  perceive immediate benefit.  Unlike going to an eye doctor where the best perscription is almost always the first one and is immediately effective, this is almost never the case in the management of chronic cough syndromes. Therefore the patient needs to commit up front to consistently adhere to recommendations  for up to 6 weeks of therapy directed at the likely underlying problem(s) before the response can be reasonably evaluated.   >>>>  F/u in 6 weeks with all meds in hand using a trust but verify approach to confirm accurate Medication  Reconciliation The principal here is that until we are certain that the  patients are doing what we've asked, it makes no sense to ask them to do more.           Each maintenance medication was reviewed in detail including emphasizing most importantly the difference between maintenance and prns and under what circumstances the prns are to be triggered using an action plan format where appropriate.  Total time for H and P, chart review, counseling  and generating customized AVS unique to this office visit / charting = 60 min

## 2019-08-04 NOTE — Progress Notes (Signed)
Jeff Willis, male    DOB: Sep 15, 1940,    MRN: [id number]   Brief patient profile:  79 yo [place] male  Never smoker/  hotel [employer]   freq international travel  prior to covid 19 restrictions so stopped traveling then onset of dry throat sensation July [id number] > ENT [facility] dx GERD rx PPI bid before meals x sev months with sinus ct neg and cxr ok and Dg Es mild gerd but no better on prilosec and no worse off so rec gabapentin which he did not try (didn't work for back pain, made him sleepy in higher doses ) so  referred to pulmonary clinic 08/04/2019 by Dr [name].   Prior cough in  2019 for several months  resolved on on some kind of powder inhaler x one week.    History of Present Illness  08/04/2019  Pulmonary/ 1st office eval/Naiya Corral  Chief Complaint  Patient presents with  . Pulmonary Consult    Referred by Dr [name]. Pt c/o non prod cough and throat clearing x 6 months.   Dyspnea:  None/ no aerobics, walking neighborhoods ok  Cough: always dry/ any candy helps if keeps in mouth but esp hard candy / does not use cough drops  Sleep: rotates on side, bed is flat/ one pillow, never wakes up cough but /win 15 min of stirring recurs daily  SABA use: never, also never prednisone  Always trouble swallowing pills x lifetime     [name] Reflux v Neurogenic Cough Differentiator Reflux Comments  Do you awaken from a sound sleep coughing violently?                            With trouble breathing? none   Do you have choking episodes when you cannot  Get enough air, gasping for air ?              none   Do you usually cough when you lie down into  The bed, or when you just lie down to rest ?                          No    Do you usually cough after meals or eating?         Yes after bfast   Do you cough when (or after) you bend over?    no   GERD SCORE     [name] Reflux v Neurogenic Cough Differentiator Neurogenic   Do you more-or-less cough all day long? sporadic   Does change of  temperature make you cough? None    Does laughing or chuckling cause you to cough? yes   Do fumes (perfume, automobile fumes, burned  Toast, etc.,) cause you to cough ?      Yes    Does speaking, singing, or talking on the phone cause you to cough   ?               Yes    Neurogenic/Airway score      No obvious other patterns in day to day or daytime variability to cough  or assoc excess/ purulent sputum or mucus plugs or hemoptysis or cp or chest tightness, subjective wheeze or overt sinus or hb symptoms.   Sleeping as above without nocturnal  or early am exacerbation  of respiratory  c/o's or need for noct saba. Also denies any obvious fluctuation of symptoms with  weather or environmental changes or other aggravating or alleviating factors except as outlined above   No unusual exposure hx or h/o childhood pna/ asthma or knowledge of premature birth.  Current Allergies, Complete Past Medical History, Past Surgical History, Family History, and Social History were reviewed in [employer] record.  ROS  The following are not active complaints unless bolded Hoarseness, sore throat, dysphagia only for pills, feels like they get stuck in throat, dental problems, itching, sneezing,  nasal congestion or discharge of excess mucus or purulent secretions, ear ache,   fever, chills, sweats, unintended wt loss or wt gain, classically pleuritic or exertional cp,  orthopnea pnd or arm/hand swelling  or leg swelling, presyncope, palpitations, abdominal pain, anorexia, nausea, vomiting, diarrhea  or change in bowel habits or change in bladder habits, change in stools or change in urine, dysuria, hematuria,  rash, arthralgias, visual complaints, headache, numbness, weakness or ataxia or problems with walking or coordination,  change in mood or  memory.          Past Medical History:  Diagnosis Date  . Back pain   . Elevated PSA   . Essential tremor   . Hypercholesteremia   . Neuropathy    . Prostate cancer ([place])   . Sinus tachycardia 01/30/2018    Outpatient Medications Prior to Visit  Medication Sig Dispense Refill  . Cholecalciferol (VITAMIN D3 PO) Take 1 tablet by mouth every 30 (thirty) days.    . fluticasone (FLONASE) 50 MCG/ACT nasal spray Place 2 sprays into both nostrils daily.    Marland Kitchen gabapentin (NEURONTIN) 100 MG capsule Take 100 mg by mouth as needed.  Not taking    . vitamin B-12 (CYANOCOBALAMIN) 1000 MCG tablet Take 1,000 mcg by mouth every Monday, Wednesday, and Friday.     Marland Kitchen aspirin EC 81 MG tablet Take by mouth.    Marland Kitchen omeprazole (PRILOSEC) 40 MG capsule Take by mouth. Off x months          Objective:     BP (!) 146/76 (BP Location: Left Arm, Cuff Size: Normal)   Pulse (!) 104   Temp 97.9 F (36.6 C) (Temporal)   Ht 5\' 6"  (1.676 m)   Wt 138 lb 9.6 oz (62.9 kg)   SpO2 98% Comment: on RA  BMI 22.37 kg/m   SpO2: 98 %(on RA)   Pleasant amb [place] male, appears healthy   HEENT : pt wearing mask not removed for exam due to covid -19 concerns.    NECK :  without JVD/Nodes/TM/ nl carotid upstrokes bilaterally   LUNGS: no acc muscle use,  Nl contour chest which is clear to A and P bilaterally without cough on insp or exp maneuvers   CV:  RRR  no s3 or murmur or increase in P2, and no edema   ABD:  soft and nontender with nl inspiratory excursion in the supine position. No bruits or organomegaly appreciated, bowel sounds nl  MS:  Nl gait/ ext warm without deformities, calf tenderness, cyanosis or clubbing No obvious joint restrictions   SKIN: warm and dry without lesions    NEURO:  alert, approp, nl sensorium with  no motor or cerebellar deficits apparent.      I personally reviewed images and agree with radiology impression as follows:  DgEs 03/15/2019 : 1. No hiatal hernia.  Trace gastroesophageal reflux elicited. 2. Mild esophageal dysmotility, characteristic of chronic reflux related dysmotility. 3. No evidence of reflux  esophagitis. No evidence of esophageal mass,  stricture or ulcer. 4. Minimal barium retention in the vallecula. Otherwise normal oral and pharyngeal phases of swallowing.   CXR 03/15/2019 No active cardiopulmonary disease.    Assessment   Upper airway cough syndrome Onset in 2019, recurred in summer 2020 but lifelong globus/diffiulty swallowing pills  - neg w/u by ENT Erik Obey) since fall [id number]  - [id number] rec cyclical cough rx with gabapentin and  pred x 6 days/ max gerd rx   The most common causes of chronic cough in immunocompetent adults include the following: upper airway cough syndrome (UACS), previously referred to as postnasal drip syndrome (PNDS), which is caused by variety of rhinosinus conditions; (2) asthma; (3) GERD; (4) chronic bronchitis from cigarette smoking or other inhaled environmental irritants; (5) nonasthmatic eosinophilic bronchitis; and (6) bronchiectasis.   These conditions, singly or in combination, have accounted for up to 94% of the causes of chronic cough in prospective studies.   Other conditions have constituted no >6% of the causes in prospective studies These have included bronchogenic carcinoma, chronic interstitial pneumonia, sarcoidosis, left ventricular failure, ACEI-induced cough, and aspiration from a condition associated with pharyngeal dysfunction.    Chronic cough is often simultaneously caused by more than one condition. A single cause has been found from 38 to 82% of the time, multiple causes from 18 to 62%. Multiply caused cough has been the result of three diseases up to 42% of the time.       Most likely this is not asthma (no noct spells, no assoc pnds) despite prev report of ? Response to "inhaler" but rather Upper airway cough syndrome (previously labeled PNDS),  is so named because it's frequently impossible to sort out how much is  CR/sinusitis with freq throat clearing (which can be related to primary GERD)   vs  causing  secondary ("  extra esophageal")  GERD from wide swings in gastric pressure that occur with throat clearing, often  promoting self use of mint and menthol lozenges that reduce the lower esophageal sphincter tone and exacerbate the problem further in a cyclical fashion.   These are the same pts (now being labeled as having "irritable larynx syndrome" by some cough centers) who not infrequently have a history of having failed to tolerate ace inhibitors,  dry powder inhalers or biphosphonates or report having atypical/extraesophageal reflux symptoms that don't respond to standard doses of PPI  and are easily confused as having aecopd or asthma flares by even experienced allergists/ pulmonologists (myself included).  In any case, Of the three most common causes of  Sub-acute / recurrent or chronic cough, only one (GERD)  can actually contribute to/ trigger  the other two (asthma and post nasal drip syndrome)  and perpetuate the cylce of cough.  While not intuitively obvious, many patients with chronic low grade reflux do not cough until there is a primary insult that disturbs the protective epithelial barrier and exposes sensitive nerve endings.   This is typically viral but can due to PNDS and  either may apply here.   >>> The point is that once this occurs, it is difficult to eliminate the cycle  using anything but a maximally effective acid suppression regimen at least in the short run, accompanied by an appropriate diet to address non acid GERD and control / eliminate the cough itself with gabapentin 100 try qid if tol plus  added 6 days of Prednisone in case of component of Th-2 driven upper or lower airways inflammation (if cough responds short term only  to relapse befor return while will on rx for uacs that would point to allergic rhinitis/ asthma or eos bronchitis) .  Advised The standardized cough guidelines published in Chest by Lissa Morales in 2006 are still the best available and consist of a multiple step  process (up to 12!) , not a single office visit,  and are intended  to address this problem logically,  with an alogrithm dependent on response to empiric treatment at  each progressive step  to determine a specific diagnosis with  minimal addtional testing needed. Therefore if adherence is an issue or can't be accurately verified,  it's very unlikely the standard evaluation and treatment will be successful here.    Furthermore, response to therapy (other than acute cough suppression, which should only be used short term with avoidance of narcotic containing cough syrups if possible), can be a gradual process for which the patient is not likely to  perceive immediate benefit.  Unlike going to an eye doctor where the best perscription is almost always the first one and is immediately effective, this is almost never the case in the management of chronic cough syndromes. Therefore the patient needs to commit up front to consistently adhere to recommendations  for up to 6 weeks of therapy directed at the likely underlying problem(s) before the response can be reasonably evaluated.   >>>>  F/u in 6 weeks with all meds in hand using a trust but verify approach to confirm accurate Medication  Reconciliation The principal here is that until we are certain that the  patients are doing what we've asked, it makes no sense to ask them to do more.           Each maintenance medication was reviewed in detail including emphasizing most importantly the difference between maintenance and prns and under what circumstances the prns are to be triggered using an action plan format where appropriate.  Total time for H and P, chart review, counseling  and generating customized AVS unique to this office visit / charting = 60 min           Christinia Gully, MD 08/04/2019

## 2019-08-04 NOTE — Patient Instructions (Addendum)
Call [name] office and ask what inhaler you received in 2019 as sample that seemed to help your cough.  Stop cetrizine, flonase   Prednisone 10 mg take  4 each am x 2 days,   2 each am x 2 days,  1 each am x 2 days and stop    Pantoprazole (protonix) 40 mg   Take  30-60 min before first meal of the day and Pepcid (famotidine)  20 mg one after supper  until return to office - this is the best way to tell whether stomach acid is contributing to your problem.    GERD (REFLUX)  is an extremely common cause of respiratory symptoms just like yours , many times with no obvious heartburn at all.    It can be treated with medication, but also with lifestyle changes including elevation of the head of your bed (ideally with 6 -8inch blocks under the headboard of your bed),  Smoking cessation, avoidance of late meals, excessive alcohol, and avoid fatty foods, chocolate, peppermint, colas, red wine, and acidic juices such as orange juice.  NO MINT OR MENTHOL PRODUCTS SO NO COUGH DROPS  USE SUGARLESS CANDY INSTEAD (Jolley ranchers or Stover's or Life Savers) or even ice chips will also do - the key is to swallow to prevent all throat clearing. NO OIL BASED VITAMINS - use powdered substitutes.  Avoid fish oil when coughing.   Gabapentin 100 mg four times daily (ok to stop when no coughing at all for a week)   Please schedule a follow up office visit in 4 weeks, sooner if needed  with all medications /inhalers/ solutions in hand so we can verify exactly what you are taking. This includes all medications from all doctors and over the counters

## 2019-08-17 ENCOUNTER — Ambulatory Visit: Payer: Medicare Other | Attending: Internal Medicine

## 2019-08-17 DIAGNOSIS — Z23 Encounter for immunization: Secondary | ICD-10-CM

## 2019-08-17 NOTE — Progress Notes (Signed)
   Covid-19 Vaccination Clinic  Name:  Jeff Willis    MRN: [id number] DOB: 08-19-1940  08/17/2019  [name] was observed post Covid-19 immunization for 15 minutes without incident. He was provided with Vaccine Information Sheet and instruction to access the V-Safe system.   [name] was instructed to call 911 with any severe reactions post vaccine: Marland Kitchen Difficulty breathing  . Swelling of face and throat  . A fast heartbeat  . A bad rash all over body  . Dizziness and weakness   Immunizations Administered    Name Date Dose VIS Date Route   Pfizer COVID-19 Vaccine 08/17/2019 10:06 AM 0.3 mL 06/23/2018 Intramuscular   Manufacturer: Bent   Lot: [id number]   Ruso: [id number]

## 2019-09-06 ENCOUNTER — Ambulatory Visit: Payer: Medicare Other | Admitting: Internal Medicine

## 2019-09-06 ENCOUNTER — Encounter: Payer: Self-pay | Admitting: Internal Medicine

## 2019-09-06 ENCOUNTER — Other Ambulatory Visit: Payer: Self-pay

## 2019-09-06 DIAGNOSIS — R05 Cough: Secondary | ICD-10-CM

## 2019-09-06 DIAGNOSIS — R058 Other specified cough: Secondary | ICD-10-CM

## 2019-09-06 MED ORDER — GABAPENTIN 100 MG PO CAPS: ORAL_CAPSULE | ORAL | 2 refills | Status: DC

## 2019-09-06 NOTE — Patient Instructions (Addendum)
Gabapentin 200 mg up to 4 x daily   Pantoprazole (protonix) 40 mg   Take  30-60 min before first meal of the day and Pepcid (famotidine)  20 mg one @  bedtime until return to office - this is the best way to tell whether stomach acid is contributing to your problem.    Keep the candy handy    Prednisone 10 mg  Take 4 for three days 3 for three days 2 for three days 1 for three days and stop    If not better in 2 weeks see [name] for your reflux    Please remember to go to the lab department and xray dept  for your tests - we will call you with the results when they are available. Add :  Did not go to either> will request he return to complete the w/u  Follow up will be as needed

## 2019-09-06 NOTE — Progress Notes (Signed)
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   Prior cough in  2019 for several months  resolved on on some kind of powder inhaler x one week.    History of Present Illness  08/04/2019  Pulmonary/ 1st office eval/Jeff Willis  Chief Complaint  Patient presents with  . Pulmonary Consult    Referred by Dr [name]. Pt c/o non prod cough and throat clearing x 6 months.   Dyspnea:  None/ no aerobics, walking neighborhoods ok  Cough: always dry/ any candy helps if keeps in mouth but esp hard candy / does not use cough drops  Sleep: rotates on side, bed is flat/ one pillow, never wakes up cough but /win 15 min of stirring recurs daily  SABA use: never, also never prednisone Always trouble swallowing pills x lifetime [name] Reflux v Neurogenic Cough Differentiator Reflux Comments  Do you awaken from a sound sleep coughing violently?                            With trouble breathing? none   Do you have choking episodes when you cannot  Get enough air, gasping for air ?              none   Do you usually cough when you lie down into  The bed, or when you just lie down to rest ?                          No    Do you usually cough after meals or eating?         Yes after bfast   Do you cough when (or after) you bend over?    no   GERD SCORE     [name] Reflux v Neurogenic Cough Differentiator Neurogenic   Do you more-or-less cough all day long? sporadic   Does change of temperature  make you cough? None    Does laughing or chuckling cause you to cough? yes   Do fumes (perfume, automobile fumes, burned  Toast, etc.,) cause you to cough ?      Yes    Does speaking, singing, or talking on the phone cause you to cough   ?               Yes    Neurogenic/Airway score     rec Call Dr Jeff Willis office and ask what inhaler you received in 2019 as sample that seemed to help your cough = advair dpi Stop cetrizine, flonase  Prednisone 10 mg take  4 each am x 2 days,   2 each am x 2 days,  1 each am x 2 days and stop  Pantoprazole (protonix) 40 mg   Take  30-60 min before first meal of the day and  Pepcid (famotidine)  20 mg one after supper  until return to office  GERD   Gabapentin 100 mg four times daily (ok to stop when no coughing at all for a week)  Please schedule a follow up office visit in 4 weeks, sooner if needed  with all medications /inhalers/ solutions in hand so we can verify exactly what you are taking. This includes all medications from all doctors and over the counters    09/06/2019  f/u ov/Jeff Willis re: uacs on gabapenitn 100 mg three times  Jeff Willis had tried gabapentin but failed to reveal that at last ov when I recommended it  Chief Complaint  Patient presents with  . Follow-up    4 wk f/u. Wants to discuss switching Advair.   Dyspnea:  No  Cough: better just while on prednisone maybe 50-80%   Sleeping: no noct cough SABA use: none 02: none    No obvious day to day or daytime variability or assoc excess/ purulent sputum or mucus plugs or hemoptysis or cp or chest tightness, subjective wheeze or overt sinus or hb symptoms.   Sleeping  without nocturnal  or early am exacerbation  of respiratory  c/o's or need for noct saba. Also denies any obvious fluctuation of symptoms with weather or environmental changes or other aggravating or alleviating factors except as outlined above   No unusual exposure hx or h/o childhood pna/ asthma or knowledge of premature  birth.  Current Allergies, Complete Past Medical History, Past Surgical History, Family History, and Social History were reviewed in [employer] record.  ROS  The following are not active complaints unless bolded Hoarseness, sore throat= something's stuck in it, dysphagia, dental problems, itching, sneezing,  nasal congestion or discharge of excess mucus or purulent secretions, ear ache,   fever, chills, sweats, unintended wt loss or wt gain, classically pleuritic or exertional cp,  orthopnea pnd or arm/hand swelling  or leg swelling, presyncope, palpitations, abdominal pain, anorexia, nausea, vomiting, diarrhea  or change in bowel habits or change in bladder habits, change in stools or change in urine, dysuria, hematuria,  rash, arthralgias, visual complaints, headache, numbness, weakness or ataxia or problems with walking or coordination,  change in mood or  memory.        Current Meds  Medication Sig  . Cholecalciferol (VITAMIN D3 PO) Take 1 tablet by mouth every 30 (thirty) days.  . famotidine (PEPCID) 20 MG tablet One after supper  . gabapentin (NEURONTIN) 100 MG capsule Take 1 capsule (100 mg total) by mouth 4 (four) times daily.  . pantoprazole (PROTONIX) 40 MG tablet Take 1 tablet (40 mg total) by mouth daily. Take 30-60 min before first meal of the day  . vitamin B-12 (CYANOCOBALAMIN) 1000 MCG tablet Take 1,000 mcg by mouth every Monday, Wednesday, and Friday.             Past Medical History:  Diagnosis Date  . Back pain   . Elevated PSA   . Essential tremor   . Hypercholesteremia   . Neuropathy   . Prostate cancer ([place])   . Sinus tachycardia 01/30/2018      Objective:      somber amb [place] male nad incessant throat clearing    HEENT : pt wearing mask not removed for exam due to covid -19 concerns.    NECK :  without JVD/Nodes/TM/ nl carotid upstrokes bilaterally   LUNGS: no acc muscle use,  Nl contour chest with trace crackles bases  bilaterally without cough on insp or exp maneuvers   CV:  RRR  no s3 or murmur or increase in P2, and no edema   ABD:  soft and nontender with nl inspiratory excursion in the supine position. No bruits or organomegaly appreciated, bowel sounds nl  MS:  Nl gait/ ext warm without deformities, calf tenderness, cyanosis or clubbing No obvious joint restrictions   SKIN: warm and dry without lesions    NEURO:  alert, approp, nl sensorium with  no motor or cerebellar deficits apparent.       CXR PA and Lateral:   09/06/2019 :    I personally reviewed images and agree with radiology impression as follows:    >>>did not go to xray  Labs ordered 09/06/2019  :  allergy profile/esr >>> Did not go to lab       Assessment

## 2019-09-07 ENCOUNTER — Ambulatory Visit (INDEPENDENT_AMBULATORY_CARE_PROVIDER_SITE_OTHER)
Admission: RE | Admit: 2019-09-07 | Discharge: 2019-09-07 | Disposition: A | Discharge status: Home / Self Care | Payer: Medicare Other | Source: Ambulatory Visit | Attending: Internal Medicine | Admitting: Internal Medicine

## 2019-09-07 ENCOUNTER — Other Ambulatory Visit (INDEPENDENT_AMBULATORY_CARE_PROVIDER_SITE_OTHER): Payer: Medicare Other

## 2019-09-07 ENCOUNTER — Telehealth: Payer: Self-pay | Admitting: *Deleted

## 2019-09-07 ENCOUNTER — Encounter: Payer: Self-pay | Admitting: Internal Medicine

## 2019-09-07 DIAGNOSIS — R058 Other specified cough: Secondary | ICD-10-CM

## 2019-09-07 DIAGNOSIS — R05 Cough: Secondary | ICD-10-CM

## 2019-09-07 LAB — CBC WITH DIFFERENTIAL/PLATELET
Basophils Absolute: 0 10*3/uL (ref 0.0–0.1)
Basophils Relative: 0.5 % (ref 0.0–3.0)
Eosinophils Absolute: 0.1 10*3/uL (ref 0.0–0.7)
Eosinophils Relative: 1.8 % (ref 0.0–5.0)
HCT: 41.1 % (ref 39.0–52.0)
Hemoglobin: 14 g/dL (ref 13.0–17.0)
Lymphocytes Relative: 38 % (ref 12.0–46.0)
Lymphs Abs: 2.6 10*3/uL (ref 0.7–4.0)
MCHC: 34.1 g/dL (ref 30.0–36.0)
MCV: 95.9 fl (ref 78.0–100.0)
Monocytes Absolute: 0.8 10*3/uL (ref 0.1–1.0)
Monocytes Relative: 11 % (ref 3.0–12.0)
Neutro Abs: 3.4 10*3/uL (ref 1.4–7.7)
Neutrophils Relative %: 48.7 % (ref 43.0–77.0)
Platelets: 199 10*3/uL (ref 150.0–400.0)
RBC: 4.28 Mil/uL (ref 4.22–5.81)
RDW: 13.2 % (ref 11.5–15.5)
WBC: 6.9 10*3/uL (ref 4.0–10.5)

## 2019-09-07 LAB — SEDIMENTATION RATE: Sed Rate: 41 mm/hr — ABNORMAL HIGH (ref 0–20)

## 2019-09-07 MED ORDER — PREDNISONE 10 MG PO TABS: ORAL_TABLET | ORAL | 0 refills | Status: DC

## 2019-09-07 NOTE — Telephone Encounter (Signed)
Spoke with the pt  He is coming today for labs and cxr  Nothing further needed

## 2019-09-07 NOTE — Telephone Encounter (Signed)
-----   Message from Tanda Rockers, MD sent at 09/07/2019  6:03 AM EDT ----- Ask him to please return to complete the w/u lab and xrays - if declines then change to phone note

## 2019-09-07 NOTE — Assessment & Plan Note (Signed)
Onset in 2019, recurred in summer 2020 but lifelong globus/diffiulty swallowing pills  - neg w/u by ENT Erik Obey) since fall 5498  - DgEs 03/15/2019: 1. No hiatal hernia.  Trace gastroesophageal reflux elicited. 2. Mild esophageal dysmotility, characteristic of chronic reflux related dysmotility. 3. No evidence of reflux esophagitis. No evidence of esophageal mass, stricture or ulcer. 4. Minimal barium retention in the vallecula. Otherwise normal oral and pharyngeal phases of swallowing. - 06/04/4156 rec cyclical cough rx with gabapentin and  pred x 6 days/ max gerd rx> may have helped som  - Allergy profile 09/06/2019 >  Eos 0. /  IgE  > did not go to lab> rec 12 days pred and w/u for ? ILD ? But did not go for cxr as rec nor esr   I did not detect the subtle crackles on insp on prior exam but note no cough with deep insp and incessant throat clearing with sensation of globus in pt with prev documented mild GERD are typical of Upper airway cough syndrome (previously labeled PNDS),  is so named because it's frequently impossible to sort out how much is  CR/sinusitis with freq throat clearing (which can be related to primary GERD)   vs  causing  secondary (" extra esophageal")  GERD from wide swings in gastric pressure that occur with throat clearing, often  promoting self use of mint and menthol lozenges that reduce the lower esophageal sphincter tone and exacerbate the problem further in a cyclical fashion.   These are the same pts (now being labeled as having "irritable larynx syndrome" by some cough centers) who not infrequently have a history of having failed to tolerate ace inhibitors,  dry powder inhalers or biphosphonates or report having atypical/extraesophageal reflux symptoms that don't respond to standard doses of PPI  and are easily confused as having aecopd or asthma flares by even experienced allergists/ pulmonologists (myself included).   Response to prednisone though raises ? Of  component of allergic rhinitis/ cough vairant asthma or eos bronchitis and rec repeat 12 days of pred while on max gerd rx then consider GI or Dr Joya Gaskins at [facility] referral.

## 2019-09-08 ENCOUNTER — Other Ambulatory Visit: Payer: Self-pay | Admitting: Internal Medicine

## 2019-09-08 DIAGNOSIS — R059 Cough, unspecified: Secondary | ICD-10-CM

## 2019-09-08 LAB — RESPIRATORY ALLERGY PROFILE REGION II ~~LOC~~
Allergen, A. alternata, m6: <0.1 kU/L
Allergen, Cedar tree, t12: <0.1 kU/L
Allergen, Comm Silver Birch, t9: <0.1 kU/L
Allergen, Cottonwood, t14: <0.1 kU/L
Allergen, D pternoyssinus,d7: <0.1 kU/L
Allergen, Mouse Urine Protein, e78: <0.1 kU/L
Allergen, Mulberry, t76: <0.1 kU/L
Allergen, Oak,t7: <0.1 kU/L
Allergen, P. notatum, m1: <0.1 kU/L
Aspergillus fumigatus, m3: <0.1 kU/L
Bermuda Grass: <0.1 kU/L
Box Elder IgE: <0.1 kU/L
CLADOSPORIUM HERBARUM (M2) IGE: <0.1 kU/L
COMMON RAGWEED (SHORT) (W1) IGE: 0.15 kU/L — ABNORMAL HIGH
Cat Dander: <0.1 kU/L
Class: 0
Class: 0
Class: 0
Class: 0
Class: 0
Class: 0
Class: 0
Class: 0
Class: 0
Class: 0
Class: 0
Class: 0
Class: 0
Class: 0
Class: 0
Class: 0
Class: 0
Class: 0
Class: 0
Class: 0
Class: 0
Class: 0
Class: 0
Cockroach: <0.1 kU/L
D. farinae: <0.1 kU/L
Dog Dander: <0.1 kU/L
Elm IgE: <0.1 kU/L
IgE (Immunoglobulin E), Serum: 36 kU/L (ref <=114)
Johnson Grass: <0.1 kU/L
Pecan/Hickory Tree IgE: <0.1 kU/L
Rough Pigweed  IgE: <0.1 kU/L
Sheep Sorrel IgE: <0.1 kU/L
Timothy Grass: <0.1 kU/L

## 2019-09-08 LAB — INTERPRETATION:

## 2019-09-08 NOTE — Progress Notes (Signed)
Spoke with pt and notified of results per Dr. [name]. Pt verbalized understanding.

## 2019-09-09 ENCOUNTER — Other Ambulatory Visit: Payer: Self-pay

## 2019-09-09 ENCOUNTER — Encounter: Payer: Self-pay | Admitting: Internal Medicine

## 2019-09-09 ENCOUNTER — Ambulatory Visit (INDEPENDENT_AMBULATORY_CARE_PROVIDER_SITE_OTHER): Payer: Medicare Other | Admitting: Internal Medicine

## 2019-09-09 DIAGNOSIS — R05 Cough: Secondary | ICD-10-CM

## 2019-09-09 DIAGNOSIS — R058 Other specified cough: Secondary | ICD-10-CM

## 2019-09-09 DIAGNOSIS — J849 Interstitial pulmonary disease, unspecified: Secondary | ICD-10-CM

## 2019-09-09 NOTE — Progress Notes (Signed)
Spoke with pt and notified of results per Dr. [name]. Pt verbalized understanding and denied any questions. 

## 2019-09-09 NOTE — Progress Notes (Signed)
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   Prior cough in  2019 for several months  resolved on on some kind of powder inhaler x one week = Advair     History of Present Illness  08/04/2019  Pulmonary/ 1st office eval/[name]  Chief Complaint  Patient presents with  . Pulmonary Consult    Referred by Dr [name]. Pt c/o non prod cough and throat clearing x 6 months.   Dyspnea:  None/ no aerobics, walking neighborhoods ok  Cough: always dry/ any candy helps if keeps in mouth but esp hard candy / does not use cough drops  Sleep: rotates on side, bed is flat/ one pillow, never wakes up cough but /win 15 min of stirring recurs daily  SABA use: never, also never prednisone Always trouble swallowing pills x lifetime [name] Reflux v Neurogenic Cough Differentiator Reflux Comments  Do you awaken from a sound sleep coughing violently?                            With trouble breathing? none   Do you have choking episodes when you cannot  Get enough air, gasping for air ?              none   Do you usually cough when you lie down into  The bed, or when you just lie down to rest ?                          No    Do you usually cough after meals or eating?         Yes after bfast   Do you cough when (or after) you bend over?    no   GERD SCORE     [name] Reflux v Neurogenic Cough Differentiator Neurogenic   Do you more-or-less cough all day long? sporadic   Does change of  temperature make you cough? None    Does laughing or chuckling cause you to cough? yes   Do fumes (perfume, automobile fumes, burned  Toast, etc.,) cause you to cough ?      Yes    Does speaking, singing, or talking on the phone cause you to cough   ?               Yes    Neurogenic/Airway score     rec Call [name] office and ask what inhaler you received in 2019 as sample that seemed to help your cough = advair dpi Stop cetrizine, flonase  Prednisone 10 mg take  4 each am x 2 days,   2 each am x 2 days,  1 each am x 2 days and stop  Pantoprazole (protonix) 40 mg   Take  30-60 min before first meal of  the day and Pepcid (famotidine)  20 mg one after supper  until return to office  GERD   Gabapentin 100 mg four times daily (ok to stop when no coughing at all for a week)  Please schedule a follow up office visit in 4 weeks, sooner if needed  with all medications /inhalers/ solutions in hand so we can verify exactly what you are taking. This includes all medications from all doctors and over the counters    09/06/2019  f/u ov/[name] re: uacs on gabapenitn 100 mg three times  wolicki had tried gabapentin but failed to reveal that at last ov when I recommended it  Chief Complaint  Patient presents with  . Follow-up    4 wk f/u. Wants to discuss switching Advair.   Dyspnea:  No  Cough: better just while on prednisone maybe 50-80%   Sleeping: no noct cough SABA use: none 02: none  rec Gabapentin 200 mg up to 4 x daily  Pantoprazole (protonix) 40 mg   Take  30-60 min before first meal of the day and Pepcid (famotidine)  20 mg one @  bedtime until return to office - this is the best way to tell whether stomach acid is contributing to your problem.   Keep the candy handy  Prednisone 10 mg  Take 4 for three days 3 for three days 2 for three days 1 for three days and stop  If not better in 2 weeks see [name] for your reflux  Please remember to go to the lab department and xray dept  for your  tests - we will call you with the results when they are available.     Virtual Visit via Telephone Note 09/09/2019   I connected with Jeff Willis on 09/09/19 at  2:30 PM EDT by telephone and verified that I am speaking with the correct person using two identifiers.   I discussed the limitations, risks, security and privacy concerns of performing an evaluation and management service by telephone and the availability of in person appointments. I also discussed with the patient that there may be a patient responsible charge related to this service. The patient expressed understanding and agreed to proceed.   History of Present Illness: Dyspnea:  Not limited by breathing from desired activities  But not aerobic / does steps ok  Cough: improved p prednisone started 5/12 and gabapenin 100 x 2 tid  Sleeping: fine  SABA use: none  02: none    No obvious day to day or daytime variability or assoc excess/ purulent sputum or mucus plugs or hemoptysis or cp or chest tightness, subjective wheeze or overt sinus or hb symptoms.    Also denies any obvious fluctuation of symptoms with weather or environmental changes or other aggravating or alleviating factors except as outlined above.   Meds reviewed/ med reconciliation completed        Observations/Objective: Sounds fine, no conversational sob and no spont cough or throat clearing      Assessment and Plan: See problem list for active a/p's   Follow Up Instructions: See avs for instructions unique to this ov which includes revised/ updated med list     I discussed the assessment and treatment plan with the patient. The patient was provided an opportunity to ask questions and all were answered. The patient agreed with the plan and demonstrated an understanding of the instructions.   The patient was advised to call back or seek an in-person evaluation if the  symptoms worsen or if the condition fails to improve as anticipated.   I provided 15 minutes of non-face-to-face time during this encounter.   Christinia Gully, MD                      Past Medical History:  Diagnosis Date  . Back pain   . Elevated PSA   . Essential tremor   . Hypercholesteremia   . Neuropathy   . Prostate cancer ([place])   . Sinus tachycardia 01/30/2018

## 2019-09-09 NOTE — Assessment & Plan Note (Signed)
Onset in 2019, recurred in summer 2020 but lifelong globus/diffiulty swallowing pills  - neg w/u by ENT Erik Obey) since fall [id number]  - DgEs 03/15/2019: 1. No hiatal hernia.  Trace gastroesophageal reflux elicited. 2. Mild esophageal dysmotility, characteristic of chronic reflux related dysmotility. 3. No evidence of reflux esophagitis. No evidence of esophageal mass, stricture or ulcer. 4. Minimal barium retention in the vallecula. Otherwise normal oral and pharyngeal phases of swallowing. - [id number] rec cyclical cough rx with gabapentin and  pred x 6 days/ max gerd rx> may have helped som  - Allergy profile 09/06/2019 >  Eos 0.1 /  IgE 36  RAST pos ragweed only   - cxr 09/09/2019 c/w ? Early ILD > see ILD a/p    Will hold prednisone for now and just rx gabapentin 200 tid for symptomatic relief, max gerd rx and await ct  Chest.

## 2019-09-09 NOTE — Assessment & Plan Note (Signed)
First noted on cxr  09/09/2019 with new crackles on insp bases  - 09/09/2019  Esr 41 - HRCT 09/23/19 >>>  DDx for pulmonary fibrosis  includes idiopathic pulmonary fibrosis, pulmonary fibrosis associated with rheumatologic diseases (which have a relatively benign course in most cases) , adverse effect from  drugs such as chemotherapy or amiodarone exposure, nonspecific interstitial pneumonia which is typically steroid responsive, and chronic hypersensitivity pneumonitis.   In active  smokers Langerhan's Cell  Histiocyctosis (eosinophilic granuomatosis),  DIP,  and Respiratory Bronchiolitis ILD also need to be considered,  But do not apply here.  Discussed in detail all the  indications, usual  risks and alternatives  relative to the benefits with patient who agrees to proceed with w/u HRCT then return to PF clinic if PF present and can do collagen vascular screen and HSP serology then.    Each maintenance medication was reviewed in detail including most importantly the difference between maintenance and as needed and under what circumstances the prns are to be used.  Please see AVS for specific  Instructions which are unique to this visit and I personally typed out  which were reviewed in detail over the phone with the patient and a copy provided via MyChart

## 2019-09-09 NOTE — Patient Instructions (Addendum)
Stop prednisone but continue the acid suppression/ diet/ and proceed with CT chest plus sinus CT same day  You may have some form of pulmonary fibrosis that is inflammatory in nature like having inflammation in joints and the best way to sort it out is a high resolution scan and referral to our clinic run by Drs Chase Caller and [facility] but  Nothing to do different in terms of your care until we get the scan first.  After the scan it's ok to resume the prednisone taper if you feel you need it for the cough.

## 2019-09-15 ENCOUNTER — Other Ambulatory Visit: Payer: Self-pay

## 2019-09-15 ENCOUNTER — Ambulatory Visit
Admission: RE | Admit: 2019-09-15 | Discharge: 2019-09-15 | Disposition: A | Discharge status: Home / Self Care | Payer: Medicare Other | Source: Ambulatory Visit | Attending: Internal Medicine | Admitting: Internal Medicine

## 2019-09-15 DIAGNOSIS — R059 Cough, unspecified: Secondary | ICD-10-CM

## 2019-09-15 NOTE — Progress Notes (Signed)
Results called to patient's son.  Verbalized understanding.  Advised son that someone from the office would call them regarding the PF clinic.

## 2019-09-23 ENCOUNTER — Other Ambulatory Visit: Payer: Medicare Other

## 2019-10-05 ENCOUNTER — Telehealth: Payer: Self-pay | Admitting: Internal Medicine

## 2019-10-05 ENCOUNTER — Encounter: Payer: Self-pay | Admitting: Internal Medicine

## 2019-10-05 ENCOUNTER — Other Ambulatory Visit: Payer: Self-pay

## 2019-10-05 ENCOUNTER — Ambulatory Visit: Payer: Medicare Other | Admitting: Internal Medicine

## 2019-10-05 VITALS — BP 130/80 | HR 122 | Temp 97.9°F | Ht 67.0 in | Wt 137.2 lb

## 2019-10-05 DIAGNOSIS — J849 Interstitial pulmonary disease, unspecified: Secondary | ICD-10-CM | POA: Diagnosis not present

## 2019-10-05 DIAGNOSIS — R000 Tachycardia, unspecified: Secondary | ICD-10-CM | POA: Diagnosis not present

## 2019-10-05 LAB — SEDIMENTATION RATE: Sed Rate: 24 mm/hr — ABNORMAL HIGH (ref 0–20)

## 2019-10-05 NOTE — Patient Instructions (Addendum)
Interstitial lung disease ([place])   - I am concerned you  have Interstitial Lung Disease (ILD) or  Pulmonary Fibrosis  -  There are MANY varieties of this - To narrow down possibilities and assess severity please do the following tests  - do spirometry and dlco test (no BD response, no lung volumes; choose a location depending on schedule and convenience)  -- do overnight oxygen test on room air  -- do autoimmune panel: Serum: ESR, ACE, ANA, DS-DNA, RF, anti-CCP, ssA, ssB, scl-70, ANCA, MPO and PR-3 antibodies, Total CK,  Aldolase,  Hypersensitivity Pneumonitis Panel  - Do ILD question packet 10/05/2019 and drop it off 10/05/2019 or 10/06/19    Tachycardia  - recommend cardiology referral  Even if likely related to ILD (OR) talk to PCP .Gaynelle Arabian, MD   Followup  - video or face to face - 30 min next 2-4 weeks to see Dr Chase Caller to discuss next steps in diagnosis and management

## 2019-10-05 NOTE — Telephone Encounter (Signed)
MR - please advise. Thanks.  pt checking out: MR requested pt come back in 2-4 weeks for next steps in diagnosis-- no availablity existed. Pt next appt 7/19. If this is too far, MR will need to open a spot in schedule. If this is not a problme, pt can keep appt 7/19. Please advise

## 2019-10-05 NOTE — Progress Notes (Signed)
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   Prior cough in  2019 for several months  resolved on on some kind of powder inhaler x one week = Advair     History of Present Illness  08/04/2019  Pulmonary/ 1st office eval/[name]  Chief Complaint  Patient presents with  . Pulmonary Consult    Referred by Dr [name]. Pt c/o non prod cough and throat clearing x 6 months.   Dyspnea:  None/ no aerobics, walking neighborhoods ok  Cough: always dry/ any candy helps if keeps in mouth but esp hard candy / does not use cough drops  Sleep: rotates on side, bed is flat/ one pillow, never wakes up cough but /win 15 min of stirring recurs daily  SABA use: never, also never prednisone Always trouble swallowing pills x lifetime [name] Reflux v Neurogenic Cough Differentiator Reflux Comments  Do you awaken from a sound sleep coughing violently?                            With trouble breathing? none   Do you have choking episodes when you cannot  Get enough air, gasping for air ?              none   Do you usually cough when you lie down into  The bed, or when you just lie down to rest ?                          No    Do you usually cough after meals or eating?         Yes after bfast   Do you cough when (or after) you bend over?    no   GERD SCORE     [name] Reflux v Neurogenic Cough Differentiator Neurogenic   Do you more-or-less cough all day long? sporadic   Does change  of temperature make you cough? None    Does laughing or chuckling cause you to cough? yes   Do fumes (perfume, automobile fumes, burned  Toast, etc.,) cause you to cough ?      Yes    Does speaking, singing, or talking on the phone cause you to cough   ?               Yes    Neurogenic/Airway score     rec Call [name] office and ask what inhaler you received in 2019 as sample that seemed to help your cough = advair dpi Stop cetrizine, flonase  Prednisone 10 mg take  4 each am x 2 days,   2 each am x 2 days,  1 each am x 2 days and stop  Pantoprazole (protonix) 40 mg   Take  30-60 min before first  meal of the day and Pepcid (famotidine)  20 mg one after supper  until return to office  GERD   Gabapentin 100 mg four times daily (ok to stop when no coughing at all for a week)  Please schedule a follow up office visit in 4 weeks, sooner if needed  with all medications /inhalers/ solutions in hand so we can verify exactly what you are taking. This includes all medications from all doctors and over the counters    09/06/2019  f/u ov/[name] re: uacs on gabapenitn 100 mg three times  wolicki had tried gabapentin but failed to reveal that at last ov when I recommended it  Chief Complaint  Patient presents with  . Follow-up    4 wk f/u. Wants to discuss switching Advair.   Dyspnea:  No  Cough: better just while on prednisone maybe 50-80%   Sleeping: no noct cough SABA use: none 02: none  rec Gabapentin 200 mg up to 4 x daily  Pantoprazole (protonix) 40 mg   Take  30-60 min before first meal of the day and Pepcid (famotidine)  20 mg one @  bedtime until return to office - this is the best way to tell whether stomach acid is contributing to your problem.   Keep the candy handy  Prednisone 10 mg  Take 4 for three days 3 for three days 2 for three days 1 for three days and stop  If not better in 2 weeks see [name] for your reflux  Please remember to go to the lab department and xray dept  for  your tests - we will call you with the results when they are available.         OV 10/05/2019  Subjective:  [name], male , DOB: 04/02/1941 , age 13 y.o. , MRN: [id number] , ADDRESS: [place] [address]   10/05/2019 -   Chief Complaint  Patient presents with  . Follow-up    Pt states he has been doing okay since last visit and denies any complaints.   -79 year old [place] male.  Referred for interstitial lung disease.  He is accompanied by his son Jeff Willis who is a Software engineer.  I know Jeff Willis  from a few years ago when heused to accompany his aunt; asthma patient of mine. Jeff Willis  has has been referred by [name] to the ILD center.  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  Up until the pandemic used to travel quite frequently.  He is to go to [place] for 3 months.  At none of the properties he is being exposed to mold.  He developed insidious onset of chronic cough over a year ago this has been persistent.  In the last few months several cough related treatments have been tried with some improvement.  Has been on gabapentin but this made him sleepy and is currently stopped it.  Because of chronic cough which he only attributes his constant clearing of the throat he had a high-resolution CT scan of the chest that I personally visualized and shows probable UIP pattern and therefore he has been referred here.  In my personal visualization it might even be definite of UIP because there might be some early honeycombing there.  He denies any shortness of breath.  At rest he was found to be tachycardic but he says this can at baseline for him.  He will follow this up with his primary care  physician.  [place] [facility] Comprehensive( ILD Questionnaire  Symptoms:   He denies any shortness of breath.  No episodic shortness of breath.  However on the questioning he does report mild shortness of breath climbing up stairs.  In  terms of his cough it started in June 2020.  It is the same since it started maybe in the last week it is slightly better.  Is moderate in intensity.  He does clear the throat.  Mostly dry cough occasionally white sputum.  Does feel a tickle in the back of his throat.  He is not waking up in the middle of the night.  Occasionally affects his voice.  No nausea no vomiting no diarrhea no wheezing.  SYMPTOM SCALE - ILD 10/05/2019   O2 use ra  Shortness of Breath 0 -> 5 scale with 5 being worst (score 6 If unable to do)  At rest 0  Simple tasks - showers, clothes change, eating, shaving 0  Household (dishes, doing bed, laundry) 0  Shopping 0  Walking level at own pace 0  Walking up Stairs 1  Total (30-36) Dyspnea Score 1  How bad is your cough? 2  How bad is your fatigue 0  How bad is nausea 0  How bad is vomiting?  00  How bad is diarrhea? 0  How bad is anxiety? 00  How bad is depression 0      Past Medical History : Denies any asthma COPD or heart failure rheumatoid arthritis or scleroderma or lupus or polymyositis or Sjogren's.  Denies sleep apnea.  Denies HIV denies pulmonary hypertension.  Denies diabetes or thyroid disease or stroke or seizures mononucleosis.  Denies any hepatitis or tuberculosis or kidney disease or pneumonia or blood clots or heart disease or pleurisy.  Does have on and off acid reflux for the last year   ROS: Positive for dry mouth for the last several months.  He also has some dysphagia for swallowing pills unclear for what duration.  He also has acid reflux on PPI and H2 blocker.  Denies any Raynaud's or recurrent fever or weight loss.  No nausea no vomiting.   FAMILY HISTORY of LUNG DISEASE: * -Denies family history of COPD or asthma sarcoid or cystic fibrosis of hypersensitive pneumonitis or autoimmune disease.   EXPOSURE HISTORY: Denies any Covid.  Denies smoking cigarettes.  Denies marijuana denies smoking.  Denies IV drug use denies cocaine   HOME and  HOBBY DETAILS : [place]-family home [place] for the last 10 years.  Age of the home is 15 years.  No dampness.  No mildew.  No humidifier use.  No CPAP use no nebulizer machine use.  He does use a steam iron but there is no mold or mildew in it.  There is no [place] inside the house no pet birds or parakeets.  No pet gerbils no feather pillows no mold in the [facility] duct no music habits.  He does do some gardening very small garden in the house limited gardening mostly just water the garden.  No bird feather exposure no flood damage no strong mats no hot tub or Jacuzzi.  No exposure to animals at work.   OCCUPATIONAL HISTORY (122 questions) :   -Organic antigen exposure is negative inorganic exposure is negative   PULMONARY TOXICITY HISTORY (27 items):  -18-day prednisone course that ended last week.  He did have seen implantation for prostate cancer in October 2019 these were radiation seeds.  Simple office walk 185 feet x  3 laps goal with forehead probe 10/05/2019   O2 used ra  Number laps completed 3  Comments about pace avt  Resting Pulse Ox/HR 100% and 122/min  Final Pulse Ox/HR 100% and 143/min  Desaturated </= 88% x  Desaturated <= 3% points x  Got Tachycardic >/= 90/min x  Symptoms at end of test x  Miscellaneous comments x    Results for GLENNON, KOPKO (MRN [id number]) as of 10/05/2019 10:40  Ref. Range 09/07/2019 11:29  Eosinophils Absolute Latest Ref Range: 0.0 - 0.7 K/uL 0.1     High-resolution CT chest Sep 15, 2019: Personally visualized and agree with the findings below IMPRESSION: 1. Spectrum of findings compatible with basilar predominant fibrotic interstitial lung disease with suggestion of early honeycombing. 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 [place], Iss 5, [phone], Dec 28 2016. 2. Ectatic 4.4 cm ascending thoracic aorta. Recommend annual imaging followup by CTA or MRA. This recommendation follows 2010 ACCF/AHA/AATS/ACR/ASA/SCA/SCAI/SIR/STS/SVM Guidelines for the Diagnosis and Management of Patients with Thoracic Aortic Disease. Circulation. 2010; 121: S341-D622. Aortic aneurysm NOS (ICD10-I71.9). 3. One vessel coronary atherosclerosis. 4. Aortic Atherosclerosis (ICD10-I70.0).   Electronically Signed   By: Ilona Sorrel M.D.   On: 09/15/2019 14:26    Results for DARWIN, ROTHLISBERGER (MRN [id number]) as of 10/05/2019 10:40  Ref. Range 09/07/2019 11:29  Sheep Sorrel IgE Latest Units: kU/L <0.10  Pecan/Hickory Tree IgE Latest Units: kU/L <0.10  IgE (Immunoglobulin E), Serum Latest Ref Range: <OR=114 kU/L 36  Allergen, D pternoyssinus,d7 Latest Units: kU/L <0.10  Cat Dander Latest Units: kU/L <0.10  Dog Dander Latest Units: kU/L <0.10  [place] Grass Latest Units: kU/L <0.10  Johnson Grass Latest Units: kU/L <0.10  Timothy Grass Latest Units: kU/L <0.10  Cockroach Latest Units: kU/L <0.10  Aspergillus fumigatus, m3 Latest Units: kU/L <0.10  Allergen, Comm Silver Wendee Copp, t9 Latest Units: kU/L <0.10  Allergen, Cottonwood, t14 Latest Units: kU/L <0.10  Elm IgE Latest Units: kU/L <0.10  Allergen, Mulberry, t76 Latest Units: kU/L <0.10  Allergen, Oak,t7 Latest Units: kU/L <0.10  COMMON RAGWEED (SHORT) (W1) IGE Latest Units: kU/L 0.15 (H)  Allergen, Mouse Urine Protein, e78 Latest Units: kU/L <0.10  D. farinae Latest Units: kU/L <0.10  Allergen, Cedar tree, t12 Latest Units: kU/L <0.10  Box Elder IgE Latest Units: kU/L <0.10  Rough Pigweed  IgE Latest Units: kU/L <0.10    ROS - per HPI     has a past medical history of Back pain, Elevated PSA, Essential tremor, Hypercholesteremia, Neuropathy, Prostate cancer ([place]), and Sinus tachycardia (01/30/2018).   reports that he has never smoked. He has never used smokeless tobacco.  Past Surgical History:   Procedure Laterality Date  . LUMBAR MICRODISCECTOMY     L4-5  . RADIOACTIVE SEED IMPLANT N/A 02/24/2018   Procedure: RADIOACTIVE SEED IMPLANT/BRACHYTHERAPY IMPLANT;  Surgeon: Festus Aloe, MD;  Location: [facility];  Service: Urology;  Laterality: N/A;  . SPACE OAR INSTILLATION N/A 02/24/2018   Procedure: SPACE OAR INSTILLATION;  Surgeon: Festus Aloe, MD;  Location: [facility];  Service: Urology;  Laterality: N/A;    No Known Allergies  Immunization History  Administered Date(s) Administered  . Influenza, High Dose Seasonal PF 01/28/2019  . Influenza-Unspecified 12/29/2015  . PFIZER SARS-COV-2 Vaccination 07/23/2019, 08/17/2019  . Pneumococcal-Unspecified 02/28/2015    Family History  Problem Relation Age of Onset  . Other Mother        died at 39 of natural causes  . Other Father        unsure of health  . Cancer Neg Hx      Current Outpatient Medications:  .  Cholecalciferol (VITAMIN D3 PO), Take 1 tablet by mouth every 30 (thirty) days., Disp: , Rfl:  .  famotidine (PEPCID) 20 MG tablet, One after supper, Disp: 30 tablet, Rfl: 11 .  pantoprazole (PROTONIX) 40 MG tablet, Take 1 tablet (40 mg total) by mouth daily. Take 30-60 min before first meal of the day, Disp: 30 tablet, Rfl: 2 .  vitamin B-12 (CYANOCOBALAMIN) 1000 MCG tablet, Take 1,000 mcg by mouth every Monday, Wednesday, and Friday. , Disp: , Rfl:  .  gabapentin (NEURONTIN) 100 MG capsule, 2 pills four time a day (Patient not taking: Reported on 10/05/2019), Disp: 240 capsule, Rfl: 2      Objective:   Vitals:   10/05/19 1028  BP: 130/80  Pulse: (!) 122  Temp: 97.9 F (36.6 C)  TempSrc: Oral  SpO2: 100%  Weight: 137 lb 3.2 oz (62.2 kg)  Height: _0  (1.702 m)    Estimated body mass index is 21.49 kg/m as calculated from the following:   Height as of this encounter: _1  (1.702 m).   Weight as of this encounter: 137 lb 3.2 oz (62.2  kg).  _2 @  [employer]   10/05/19 1028  Weight: 137 lb 3.2 oz (62.2 kg)     Physical Exam  General Appearance:    Alert, cooperative, no distress, appears stated age - ra , Deconditioned looking - n , OBESE  - no, Sitting on Wheelchair -  no  Head:    Normocephalic, without obvious abnormality, atraumatic  Eyes:    PERRL, conjunctiva/corneas clear,  Ears:    Normal TM's and external ear canals, both ears  Nose:   Nares normal, septum midline, mucosa normal, no drainage    or sinus tenderness. OXYGEN ON  - no . Patient is @ ra   Throat:   Lips, mucosa, and tongue normal; teeth and gums normal. Cyanosis on lips - no  Neck:   Supple, symmetrical, trachea midline, no adenopathy;    thyroid:  no enlargement/tenderness/nodules; no carotid   bruit or JVD  Back:     Symmetric, no curvature, ROM normal, no CVA tenderness  Lungs:     Distress - no , Wheeze no, Barrell Chest - no, Purse lip breathing - no, Crackles - faint at lung base   Chest Wall:    No tenderness or deformity.    Heart:    Regular rate and rhythm, S1 and S2 normal, no rub   or gallop, Murmur - no  Breast Exam:    NOT DONE  Abdomen:     Soft, non-tender, bowel sounds active all four quadrants,    no masses, no organomegaly. Visceral obesity - mild yes  Genitalia:   NOT DONE  Rectal:   NOT DONE  Extremities:   Extremities - normal, Has Cane - no, Clubbing - no, Edema - no  Pulses:   2+ and symmetric all extremities  Skin:   Stigmata of Connective Tissue Disease - no  Lymph nodes:   Cervical, supraclavicular, and axillary nodes normal  Psychiatric:  Neurologic:   Pleasant - yes, Anxious - no, Flat affect - no  CAm-ICU - neg, Alert and Oriented x 3 - yes,  Moves all 4s - yes, Speech - normal, Cognition - intact           Assessment:       ICD-10-CM   1. Interstitial lung disease (HCC)  J84.9 Pulmonary function test    Pulse oximetry, overnight    Sed Rate (ESR)    Angiotensin converting enzyme     ANA    Anti-DNA antibody, double-stranded    Rheumatoid Factor    Cyclic citrul peptide antibody, IgG    Sjogren's syndrome antibods(ssa + ssb)    Anti-scleroderma antibody    Mpo/pr-3 (anca) antibodies    ANCA Screen Reflex Titer    CK Total (and CKMB)    Aldolase    Hypersensitivity pnuemonitis profile    Hypersensitivity pnuemonitis profile    Aldolase    CK Total (and CKMB)    ANCA Screen Reflex Titer    Mpo/pr-3 (anca) antibodies    Anti-scleroderma antibody    Sjogren's syndrome antibods(ssa + ssb)    Cyclic citrul peptide antibody, IgG    Rheumatoid Factor    Anti-DNA antibody, double-stranded    ANA    Angiotensin converting enzyme    Sed Rate (ESR)    CANCELED: Hypersensitivity pnuemonitis profile  2. Tachycardia  R00.0    He has probable UIP interstitial lung disease.  Based on history and risk factors appears to be acid reflux and sicca symptoms.  Autoimmune profile is required.  However the needle is looking more this is UIP/IPF given the fact he has high probable UIP on the CT scan and his history is essentially bland and is a male over 12.    Plan:     Patient Instructions  Interstitial lung disease ([place])   - I am concerned you  have Interstitial Lung Disease (ILD) or  Pulmonary Fibrosis  -  There are MANY varieties of this - To narrow down possibilities and assess severity please do the following tests  - do spirometry and dlco test (no BD response, no lung volumes; choose a location depending on schedule and convenience)  -- do overnight oxygen test on room air  -- do autoimmune panel: Serum: ESR, ACE, ANA, DS-DNA, RF, anti-CCP, ssA, ssB, scl-70, ANCA, MPO and PR-3 antibodies, Total CK,  Aldolase,  Hypersensitivity Pneumonitis Panel  - Do ILD question packet 10/05/2019 and drop it off 10/05/2019 or 10/06/19    Tachycardia  - recommend cardiology referral  Even if likely related to ILD (OR) talk to PCP .Gaynelle Arabian, MD   Followup  - video or face to  face - 30 min next 2-4 weeks to see Dr Chase Caller to discuss next steps in diagnosis and management    ( Level 05 visit: Estb 40-54 min   in  visit type: on-site physical face to visit  in total care time and counseling or/and coordination of care by this undersigned MD - Dr Brand Males. This includes one or more of the following on this same day 10/05/2019: pre-charting, chart review, note writing, documentation discussion of test results, diagnostic or treatment recommendations, prognosis, risks and benefits of management options, instructions, education, compliance or risk-factor reduction. It excludes time spent by the [place] or office staff in the care of the patient. Actual time 50 min)    SIGNATURE    Dr. Brand Males, M.D., F.C.C.P,  Pulmonary and Critical Care Medicine Staff Physician, [place] Director - Interstitial Lung Disease  Program  Pulmonary [place] at [facility]  [place], [place], [address]  Pager: [phone], If no answer or between  15:00h - 7:00h: call [phone] Telephone: [phone]  12:31 PM 10/05/2019

## 2019-10-06 ENCOUNTER — Ambulatory Visit (INDEPENDENT_AMBULATORY_CARE_PROVIDER_SITE_OTHER): Payer: Medicare Other | Admitting: Internal Medicine

## 2019-10-06 DIAGNOSIS — J849 Interstitial pulmonary disease, unspecified: Secondary | ICD-10-CM

## 2019-10-06 LAB — PULMONARY FUNCTION TEST
DL/VA % pred: 97 %
DL/VA: 4.27 ml/min/mmHg/L
DLCO cor % pred: 55 %
DLCO cor: 15.7 ml/min/mmHg
DLCO unc % pred: 55 %
DLCO unc: 15.7 ml/min/mmHg
FEF 25-75 Pre: 3.09 L/sec
FEF2575-%Pred-Pre: 166 %
FEV1-%Pred-Pre: 89 %
FEV1-Pre: 2.24 L
FEV1FVC-%Pred-Pre: 113 %
FEV6-Pre: 2.59 L
FVC-%Pred-Pre: 78 %
FVC-Pre: 2.59 L
Pre FEV1/FVC ratio: 86 %
Pre FEV6/FVC Ratio: 100 %

## 2019-10-06 NOTE — Progress Notes (Signed)
Spiro/DLCO performed today. 

## 2019-10-07 NOTE — Telephone Encounter (Signed)
Called and spoke with patient. He is ok with keeping the 7/19 appt for now. Did advise him if he developed any symptoms or noticed any differences in his breathing to call us back sooner. He verbalized understanding.   Nothing further needed at time of call.

## 2019-10-07 NOTE — Telephone Encounter (Signed)
Keep 11/15/19 appt. First week of July I am on PAL but will check with admin and see if I can do a half day of tele visits from remote. In this case I can do results review with him at that time which will be earlier

## 2019-10-10 LAB — ANGIOTENSIN CONVERTING ENZYME: Angiotensin-Converting Enzyme: 34 U/L (ref 9–67)

## 2019-10-10 LAB — ANCA SCREEN W REFLEX TITER: ANCA Screen: NEGATIVE

## 2019-10-10 LAB — MPO/PR-3 (ANCA) ANTIBODIES
Myeloperoxidase Abs: <1 AI
Serine Protease 3: <1 AI

## 2019-10-10 LAB — ANTI-NUCLEAR AB-TITER (ANA TITER): ANA Titer 1: 1:40 {titer} — ABNORMAL HIGH

## 2019-10-10 LAB — SJOGREN'S SYNDROME ANTIBODS(SSA + SSB)
SSA (Ro) (ENA) Antibody, IgG: <1 AI
SSB (La) (ENA) Antibody, IgG: <1 AI

## 2019-10-10 LAB — ANA: Anti Nuclear Antibody (ANA): POSITIVE — AB

## 2019-10-10 LAB — EXTRA SPECIMEN

## 2019-10-10 LAB — HYPERSENSITIVITY PNUEMONITIS PROFILE
ASPERGILLUS FUMIGATUS: NEGATIVE
Faenia retivirgula: NEGATIVE
Pigeon Serum: NEGATIVE
S. VIRIDIS: NEGATIVE
T. CANDIDUS: NEGATIVE
T. VULGARIS: NEGATIVE

## 2019-10-10 LAB — CK TOTAL AND CKMB (NOT AT ARMC)
CK, MB: 1.1 ng/mL (ref 0–5.0)
Relative Index: 2.2 (ref 0–4.0)
Total CK: 51 U/L (ref 44–196)

## 2019-10-10 LAB — ANTI-SCLERODERMA ANTIBODY: Scleroderma (Scl-70) (ENA) Antibody, IgG: <1 AI

## 2019-10-10 LAB — CYCLIC CITRUL PEPTIDE ANTIBODY, IGG: Cyclic Citrullin Peptide Ab: <16 UNITS

## 2019-10-10 LAB — ANTI-DNA ANTIBODY, DOUBLE-STRANDED: ds DNA Ab: <1 IU/mL

## 2019-10-10 LAB — RHEUMATOID FACTOR: Rheumatoid fact SerPl-aCnc: <14 IU/mL (ref <14)

## 2019-10-10 LAB — ALDOLASE: Aldolase: 4 U/L (ref <=8.1)

## 2019-10-11 ENCOUNTER — Encounter: Payer: Self-pay | Admitting: Internal Medicine

## 2019-10-14 ENCOUNTER — Telehealth: Payer: Self-pay | Admitting: Internal Medicine

## 2019-10-14 NOTE — Telephone Encounter (Signed)
Patient is requesting his lab results from his visit on 10/05/19.   MR, can you please advise. Thanks!

## 2019-10-14 NOTE — Telephone Encounter (Signed)
MR can address form once he is back in office. Will close encounter.

## 2019-10-15 ENCOUNTER — Telehealth: Payer: Self-pay | Admitting: *Deleted

## 2019-10-15 NOTE — Telephone Encounter (Signed)
The autoimmune profile is trace positive for ANA but otherwise negative.  Overall I would call this is a negative autoimmune profile.  This means this pulmonary fibrosis is not from autoimmune disease  Pulmonary function test suggest the severity of pulmonary fibrosis to be mild-moderate  We will discuss more at follow-up in July 2021

## 2019-10-15 NOTE — Telephone Encounter (Signed)
He was already given a call this morning about his results. The whole followup visit is to discuss results

## 2019-10-15 NOTE — Telephone Encounter (Signed)
Message routed to Dr. Chase Caller.  Dr. Chase Caller please advise.

## 2019-10-15 NOTE — Telephone Encounter (Signed)
Called and spoke with pt letting him know the info stated by MR and he verbalized understanding. Nothing further needed. 

## 2019-10-27 ENCOUNTER — Telehealth: Payer: Self-pay | Admitting: Internal Medicine

## 2019-10-27 NOTE — Telephone Encounter (Signed)
Patient is calling with the same symptoms of cough, dry throat, and throat clearing. He has tried Delsym but it is not working. He is taking reflux medications but not getting relief of his cough. He has seen ENT. Please advise. Can we call in another cough medication? He does not report any other acute symptoms. Next appointment is 11/15/2019.

## 2019-10-28 MED ORDER — PREDNISONE 10 MG PO TABS
ORAL_TABLET | ORAL | 0 refills | Status: DC
Start: 2019-10-28 — End: 2019-11-15

## 2019-10-28 NOTE — Telephone Encounter (Signed)
Pt calling back to see what can be done about cough. Please [phone]

## 2019-10-28 NOTE — Telephone Encounter (Signed)
Called and left detailed VM (DPR) with Dr Golden Pop recommendations, [phone].  Nothing further at this time.

## 2019-10-28 NOTE — Telephone Encounter (Signed)
Pt calling back requesting cough medication.  MR please advise.  Thanks!

## 2019-10-28 NOTE — Telephone Encounter (Signed)
Cough and pulmonary fibrosis is fairly complex to treat.  If the current measures are not working he can try a prednisone burst   Please take Take prednisone 40mg  once daily x 3 days, then 30mg  once daily x 3 days, then 20mg  once daily x 3 days, then prednisone 10mg  once daily  x 3 days and stop  If this also does not work then we can address in the clinic what we can do about the cough   No Known Allergies

## 2019-10-29 ENCOUNTER — Telehealth: Payer: Self-pay | Admitting: Internal Medicine

## 2019-10-29 NOTE — Telephone Encounter (Signed)
Overnight pulse ox study done on October 12, 2019: Shows time spent less than or equal to 88% was 0.1 minutes.  Lowest pulse ox was 88%.  Plan -Does not need overnight oxygen

## 2019-11-02 NOTE — Telephone Encounter (Signed)
11/02/2019  Patient's dry mouth and cough can be directly related to his known interstitial lung disease.  Previous recommendations document in patient's chart from Dr. Chase Caller specifically state that if symptoms or not improving on prednisone he will need to have a office visit to further evaluate.  Please schedule patient with either Dr. Chase Caller in a virtual visit time slot when he is in [place] or with an APP in office in [place] at next available.  If symptoms are worsening he will need to seek evaluation by his primary care provider or an urgent care.  Wyn Quaker, FNP

## 2019-11-02 NOTE — Telephone Encounter (Signed)
Pt returning missed call. Can be reached at [phone]

## 2019-11-02 NOTE — Telephone Encounter (Signed)
Spoke with patient and provided information per Aaron Edelman, he verbalized understanding.  He has a f/u scheduled with Dr. Chase Caller scheduled for 7/19.  Nothing further needed.

## 2019-11-02 NOTE — Telephone Encounter (Signed)
We will also route message to Dr. Chase Caller as Juluis Rainier.Wyn Quaker, FNP

## 2019-11-02 NOTE — Telephone Encounter (Signed)
Called patient to inform him of his overnight pulse ox results.  He verbalized understanding. He is complaining for dry mouth and cough on prednisone.  He is on Pantoprazole and Pepcid for reflux and taking as prescribed.  Any recommendations?

## 2019-11-04 ENCOUNTER — Other Ambulatory Visit: Payer: Self-pay | Admitting: Internal Medicine

## 2019-11-04 DIAGNOSIS — R058 Other specified cough: Secondary | ICD-10-CM

## 2019-11-05 ENCOUNTER — Telehealth: Payer: Self-pay | Admitting: Internal Medicine

## 2019-11-05 NOTE — Telephone Encounter (Signed)
Called and spoke with pt. Pt is requesting to have the ONO results faxed to him at [phone] (this is his personal fax).  Checked to see if I still had the results and these have already been sent to scan. Attempted to call [facility] to see if the results could either be refaxed to our office or if they could send them directly to pt but was never able to get anyone on the phone.  PCCs, is there any way you can help [place] reach out to [facility] about this please.

## 2019-11-05 NOTE — Telephone Encounter (Signed)
We do not usually get ONO results back here but I called [phone] got Bethanne Ginger she is faxing the results to the front fax

## 2019-11-05 NOTE — Telephone Encounter (Signed)
Results received and have been faxed to pt.nothing further needed.

## 2019-11-15 ENCOUNTER — Ambulatory Visit: Payer: Medicare Other | Admitting: Internal Medicine

## 2019-11-15 ENCOUNTER — Encounter: Payer: Self-pay | Admitting: Internal Medicine

## 2019-11-15 ENCOUNTER — Other Ambulatory Visit: Payer: Self-pay

## 2019-11-15 VITALS — BP 136/80 | HR 108 | Ht 67.0 in | Wt 138.0 lb

## 2019-11-15 DIAGNOSIS — R05 Cough: Secondary | ICD-10-CM

## 2019-11-15 DIAGNOSIS — K219 Gastro-esophageal reflux disease without esophagitis: Secondary | ICD-10-CM

## 2019-11-15 DIAGNOSIS — R053 Chronic cough: Secondary | ICD-10-CM

## 2019-11-15 DIAGNOSIS — J849 Interstitial pulmonary disease, unspecified: Secondary | ICD-10-CM

## 2019-11-15 NOTE — Progress Notes (Signed)
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   Prior cough in  2019 for several months  resolved on on some kind of powder inhaler x one week = Advair     History of Present Illness  08/04/2019  Pulmonary/ 1st office eval/Jeff Willis  Chief Complaint  Patient presents with  . Pulmonary Consult    Referred by Dr Jeff Willis. Pt c/o non prod cough and throat clearing x 6 months.   Dyspnea:  None/ no aerobics, walking neighborhoods ok  Cough: always dry/ any candy helps if keeps in mouth but esp hard candy / does not use cough drops  Sleep: rotates on side, bed is flat/ one pillow, never wakes up cough but /win 15 min of stirring recurs daily  SABA use: never, also never prednisone Always trouble swallowing pills x lifetime [name] Reflux v Neurogenic Cough Differentiator Reflux Comments  Do you awaken from a sound sleep coughing violently?                            With trouble breathing? none   Do you have choking episodes when you cannot  Get enough air, gasping for air ?              none   Do you usually cough when you lie down into  The bed, or when you just lie down to rest ?                          No    Do you usually cough after meals or eating?         Yes after bfast   Do you cough when (or after) you bend over?    no   GERD SCORE     [name] Reflux v Neurogenic Cough Differentiator Neurogenic   Do you more-or-less cough all day long? sporadic   Does  change of temperature make you cough? None    Does laughing or chuckling cause you to cough? yes   Do fumes (perfume, automobile fumes, burned  Toast, etc.,) cause you to cough ?      Yes    Does speaking, singing, or talking on the phone cause you to cough   ?               Yes    Neurogenic/Airway score     rec Call Dr Jeff Willis office and ask what inhaler you received in 2019 as sample that seemed to help your cough = advair dpi Stop cetrizine, flonase  Prednisone 10 mg take  4 each am x 2 days,   2 each am x 2 days,  1 each am x 2 days and stop  Pantoprazole (protonix) 40 mg   Take  30-60 min before  first meal of the day and Pepcid (famotidine)  20 mg one after supper  until return to office  GERD   Gabapentin 100 mg four times daily (ok to stop when no coughing at all for a week)  Please schedule a follow up office visit in 4 weeks, sooner if needed  with all medications /inhalers/ solutions in hand so we can verify exactly what you are taking. This includes all medications from all doctors and over the counters    09/06/2019  f/u ov/Jeff Willis re: uacs on gabapenitn 100 mg three times  wolicki had tried gabapentin but failed to reveal that at last ov when I recommended it  Chief Complaint  Patient presents with  . Follow-up    4 wk f/u. Wants to discuss switching Advair.   Dyspnea:  No  Cough: better just while on prednisone maybe 50-80%   Sleeping: no noct cough SABA use: none 02: none  rec Gabapentin 200 mg up to 4 x daily  Pantoprazole (protonix) 40 mg   Take  30-60 min before first meal of the day and Pepcid (famotidine)  20 mg one @  bedtime until return to office - this is the best way to tell whether stomach acid is contributing to your problem.   Keep the candy handy  Prednisone 10 mg  Take 4 for three days 3 for three days 2 for three days 1 for three days and stop  If not better in 2 weeks see [name] for your reflux  Please remember to go to the lab department and xray dept   for your tests - we will call you with the results when they are available.         OV 10/05/2019  Subjective:  [name], Jeff Willis , DOB: Sep 02, 1940 , age 79 y.o. , MRN: [id number] , ADDRESS: [place] Jeff Willis [address]   10/05/2019 -   Chief Complaint  Patient presents with  . Follow-up    Pt states he has been doing okay since last visit and denies any complaints.   -79 year old Jeff Willis.  Referred for interstitial lung disease.  He is accompanied by his son Jeff Willis who is a Software engineer.  I know Jeff Willis  from a few years ago when heused to accompany his aunt; asthma patient of mine. Jeff Willis  has has been referred by Dr. Christinia Willis to the ILD center.  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  Up until the pandemic used to travel quite frequently.  He is to go to [place] for 3 months.  At none of the properties he is being exposed to mold.  He developed insidious onset of chronic cough over a year ago this has been persistent.  In the last few months several cough related treatments have been tried with some improvement.  Has been on gabapentin but this made him sleepy and is currently stopped it.  Because of chronic cough which he only attributes his constant clearing of the throat he had a high-resolution CT scan of the chest that I personally visualized and shows probable UIP pattern and therefore he has been referred here.  In my personal visualization it might even be definite of UIP because there might be some early honeycombing there.  He denies any shortness of breath.  At rest he was found to be tachycardic but he says this can at baseline for him.  He will follow this up with his primary  care physician.  Jeff Willis Integrated Comprehensive( ILD Questionnaire  Symptoms:   He denies any shortness of breath.  No episodic shortness of breath.  However on the questioning he does report mild shortness of breath climbing up stairs.   In terms of his cough it started in June 2020.  It is the same since it started maybe in the last week it is slightly better.  Is moderate in intensity.  He does clear the throat.  Mostly dry cough occasionally white sputum.  Does feel a tickle in the back of his throat.  He is not waking up in the middle of the night.  Occasionally affects his voice.  No nausea no vomiting no diarrhea no wheezing.  SYMPTOM SCALE - ILD 10/05/2019   O2 use ra  Shortness of Breath 0 -> 5 scale with 5 being worst (score 6 If unable to do)  At rest 0  Simple tasks - showers, clothes change, eating, shaving 0  Household (dishes, doing bed, laundry) 0  Shopping 0  Walking level at own pace 0  Walking up Stairs 1  Total (30-36) Dyspnea Score 1  How bad is your cough? 2  How bad is your fatigue 0  How bad is nausea 0  How bad is vomiting?  00  How bad is diarrhea? 0  How bad is anxiety? 00  How bad is depression 0      Past Medical History : Denies any asthma COPD or heart failure rheumatoid arthritis or scleroderma or lupus or polymyositis or Sjogren's.  Denies sleep apnea.  Denies HIV denies pulmonary hypertension.  Denies diabetes or thyroid disease or stroke or seizures mononucleosis.  Denies any hepatitis or tuberculosis or kidney disease or pneumonia or blood clots or heart disease or pleurisy.  Does have on and off acid reflux for the last year   ROS: Positive for dry mouth for the last several months.  He also has some dysphagia for swallowing pills unclear for what duration.  He also has acid reflux on PPI and H2 blocker.  Denies any Raynaud's or recurrent fever or weight loss.  No nausea no vomiting.   FAMILY HISTORY of LUNG DISEASE: * -Denies family history of COPD or asthma sarcoid or cystic fibrosis of hypersensitive pneumonitis or autoimmune disease.   EXPOSURE HISTORY: Denies any Covid.  Denies smoking cigarettes.  Denies marijuana denies smoking.  Denies IV drug use denies cocaine   HOME  and HOBBY DETAILS : Single-family home [place] for the last 10 years.  Age of the home is 15 years.  No dampness.  No mildew.  No humidifier use.  No CPAP use no nebulizer machine use.  He does use a steam iron but there is no mold or mildew in it.  There is no [place] inside the house no pet birds or parakeets.  No pet gerbils no feather pillows no mold in the [facility] duct no music habits.  He does do some gardening very small garden in the house limited gardening mostly just water the garden.  No bird feather exposure no flood damage no strong mats no hot tub or Jacuzzi.  No exposure to animals at work.   OCCUPATIONAL HISTORY (122 questions) :   -Organic antigen exposure is negative inorganic exposure is negative   PULMONARY TOXICITY HISTORY (27 items):  -18-day prednisone course that ended last week.  He did have seen implantation for prostate cancer in October 2019 these were radiation seeds.  Simple office walk 185 feet x  3 laps goal with forehead probe 10/05/2019   O2 used ra  Number laps completed 3  Comments about pace avt  Resting Pulse Ox/HR 100% and 122/min  Final Pulse Ox/HR 100% and 143/min  Desaturated </= 88% x  Desaturated <= 3% points x  Got Tachycardic >/= 90/min x  Symptoms at end of test x  Miscellaneous comments x    Results for AERION, BAGDASARIAN (MRN [id number]) as of 10/05/2019 10:40  Ref. Range 09/07/2019 11:29  Eosinophils Absolute Latest Ref Range: 0.0 - 0.7 K/uL 0.1     High-resolution CT chest Sep 15, 2019: Personally visualized and agree with the findings below IMPRESSION: 1. Spectrum of findings compatible with basilar predominant fibrotic interstitial lung disease with suggestion of early honeycombing. 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 [place], Iss 5, [phone], Dec 28 2016. 2. Ectatic 4.4 cm ascending thoracic aorta. Recommend annual imaging followup by CTA or MRA. This recommendation follows 2010 ACCF/AHA/AATS/ACR/ASA/SCA/SCAI/SIR/STS/SVM Guidelines for the Diagnosis and Management of Patients with Thoracic Aortic Disease. Circulation. 2010; 121: U272-Z366. Aortic aneurysm NOS (ICD10-I71.9). 3. One vessel coronary atherosclerosis. 4. Aortic Atherosclerosis (ICD10-I70.0).   Electronically Signed   By: Ilona Sorrel M.D.   On: 09/15/2019 14:26    Results for FELICIA, BOTH (MRN [id number]) as of 10/05/2019 10:40  Ref. Range 09/07/2019 11:29  Sheep Sorrel IgE Latest Units: kU/L <0.10  Pecan/Hickory Tree IgE Latest Units: kU/L <0.10  IgE (Immunoglobulin E), Serum Latest Ref Range: <OR=114 kU/L 36  Allergen, D pternoyssinus,d7 Latest Units: kU/L <0.10  Cat Dander Latest Units: kU/L <0.10  Dog Dander Latest Units: kU/L <0.10  [place] Grass Latest Units: kU/L <0.10  Johnson Grass Latest Units: kU/L <0.10  Timothy Grass Latest Units: kU/L <0.10  Cockroach Latest Units: kU/L <0.10  Aspergillus fumigatus, m3 Latest Units: kU/L <0.10  Allergen, Comm Silver Wendee Copp, t9 Latest Units: kU/L <0.10  Allergen, Cottonwood, t14 Latest Units: kU/L <0.10  Elm IgE Latest Units: kU/L <0.10  Allergen, Mulberry, t76 Latest Units: kU/L <0.10  Allergen, Oak,t7 Latest Units: kU/L <0.10  COMMON RAGWEED (SHORT) (W1) IGE Latest Units: kU/L 0.15 (H)  Allergen, Mouse Urine Protein, e78 Latest Units: kU/L <0.10  D. farinae Latest Units: kU/L <0.10  Allergen, Cedar tree, t12 Latest Units: kU/L <0.10  Box Elder IgE Latest Units: kU/L <0.10  Rough Pigweed  IgE Latest Units: kU/L <0.10    ROS - per HPI   OV 11/15/2019  Subjective:  [name], Jeff Willis , DOB: [birth year] , age 40 y.o. , MRN: [id number] , ADDRESS: [place] [address]   11/15/2019 -   Chief Complaint  Patient presents with  . Follow-up    Pt states the cough has  been better since last visit.      HPI [place] 79 y.o. -presents with his son Jeff Willis for follow-up.  He is here to discuss test results.  In the interim he called for worsening cough.  We gave him prednisone.  He tells me the prednisone actually made the cough worse particularly towards the end.  After that the son  doubled up his PPI and also added H2 blockade and the cough went away completely.  Patient feels completely normal that he is possible that he has interstitial lung disease.  We reviewed the scan and I visualized  it with him together and his son.  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  They wanted to discuss the implications of the scan in the setting of controlled acid reflux and controlled cough but he is largely asymptomatic at this point.  Noted his serologies were negative except for trace ANA and his overnight oxygen study was normal.     SYMPTOM SCALE - ILD 10/05/2019   O2 use ra  Shortness of Breath 0 -> 5 scale with 5 being worst (score 6 If unable to do)  At rest 0  Simple tasks - showers, clothes change, eating, shaving 0  Household (dishes, doing bed, laundry) 0  Shopping 0  Walking level at own pace 0  Walking up Stairs 1  Total (30-36) Dyspnea Score 1  How bad is your cough? 2  How bad is your fatigue 0  How bad is nausea 0  How bad is vomiting?  00  How bad is diarrhea? 0  How bad is anxiety? 00  How bad is depression 0       Simple office walk 185 feet x  3 laps goal with forehead probe 10/05/2019   O2 used ra  Number laps completed 3  Comments about pace avt  Resting Pulse Ox/HR 100% and 122/min  Final Pulse Ox/HR 100% and 143/min  Desaturated </= 88% x  Desaturated <= 3% points x  Got Tachycardic >/= 90/min x  Symptoms at end of test x  Miscellaneous comments x    Results for KERMAN, PFOST (MRN [id number]) as of 11/15/2019 16:27  Ref. Range 09/07/2019 11:29 10/05/2019 11:25  Anti Nuclear Antibody (ANA) Latest Ref Range: NEGATIVE   POSITIVE  (A)  ANA Pattern 1 Unknown  Nuclear, Speckled (A)  ANA Titer 1 Latest Units: titer  1:40 (H)  Angiotensin-Converting Enzyme Latest Ref Range: 9 - 67 U/L  34  Cyclic Citrullin Peptide Ab Latest Units: UNITS  <16  ds DNA Ab Latest Units: IU/mL  <1  Myeloperoxidase Abs Latest Units: AI  <1.0  Serine Protease 3 Latest Units: AI  <1.0  RA Latex Turbid. Latest Ref Range: <14 IU/mL  <14  IgE (Immunoglobulin E), Serum Latest Ref Range: <OR=114 kU/L 36   SSA (Ro) (ENA) Antibody, IgG Latest Ref Range: <1.0 NEG AI  <1.0 NEG  SSB (La) (ENA) Antibody, IgG Latest Ref Range: <1.0 NEG AI  <1.0 NEG  Scleroderma (Scl-70) (ENA) Antibody, IgG Latest Ref Range: <1.0 NEG AI  <1.0 NEG   ROS - per HPI     has a past medical history of Back pain, Elevated PSA, Essential tremor, Hypercholesteremia, Neuropathy, Prostate cancer (HCC), and Sinus tachycardia (01/30/2018).   reports that he has never smoked. He has never used smokeless tobacco.  Past Surgical History:  Procedure Laterality Date  . LUMBAR MICRODISCECTOMY     L4-5  . RADIOACTIVE SEED IMPLANT N/A 02/24/2018   Procedure: RADIOACTIVE SEED IMPLANT/BRACHYTHERAPY IMPLANT;  Surgeon: Festus Aloe, MD;  Location: [facility];  Service: Urology;  Laterality: N/A;  . SPACE OAR INSTILLATION N/A 02/24/2018   Procedure: SPACE OAR INSTILLATION;  Surgeon: Festus Aloe, MD;  Location[facility];  Service: Urology;  Laterality: N/A;    No Known Allergies  Immunization History  Administered Date(s) Administered  . Influenza, High Dose Seasonal PF 01/28/2019  . Influenza-Unspecified 12/29/2015  . PFIZER SARS-COV-2 Vaccination 07/23/2019, 08/17/2019  . Pneumococcal-Unspecified 02/28/2015    Family History  Problem Relation Age of Onset  . Other Mother  died at 43 of natural causes  . Other Father        unsure of health  . Cancer Neg Hx      Current Outpatient Medications:  .  Cholecalciferol (VITAMIN  D3 PO), Take 1 tablet by mouth every 30 (thirty) days., Disp: , Rfl:  .  famotidine (PEPCID) 20 MG tablet, One after supper, Disp: 30 tablet, Rfl: 11 .  gabapentin (NEURONTIN) 100 MG capsule, 2 pills four time a day, Disp: 240 capsule, Rfl: 2 .  pantoprazole (PROTONIX) 40 MG tablet, TAKE ONE TABLET BY MOUTH DAILY TAKE 30 TO 60 MINUTES PRIOR TO FIRST MEAL OF THE DAY, Disp: 30 tablet, Rfl: 3 .  vitamin B-12 (CYANOCOBALAMIN) 1000 MCG tablet, Take 1,000 mcg by mouth every Monday, Wednesday, and Friday. , Disp: , Rfl:       Objective:   Vitals:   11/15/19 1637  BP: 136/80  Pulse: (!) 108  SpO2: 100%  Weight: 138 lb (62.6 kg)  Height: 5\' 7"  (1.702 m)    Estimated body mass index is 21.61 kg/m as calculated from the following:   Height as of this encounter: 5\' 7"  (1.702 m).   Weight as of this encounter: 138 lb (62.6 kg).  @WEIGHTCHANGE @  [employer]   11/15/19 1637  Weight: 138 lb (62.6 kg)     Physical Exam Discussion only visit.      Assessment:       ICD-10-CM   1. Interstitial lung disease (HCC)  J84.9 CT Chest High Resolution    Pulmonary Function Test  2. Gastroesophageal reflux disease, unspecified whether esophagitis present  K21.9   3. Chronic cough  R05    Acid reflux is important risk factor in the setting of interstitial lung disease.  Based on probable UIP Jeff Willis greater than 65, negative serology and a dry ILD questionnaire this diagnosis IPF and was proven otherwise.  It is a high pretest probably for IPF.  Explained that acid reflux control will be important in the protecting his lungs against future progression but does not illuminated.  Still with IPF he is at risk for progression.  Antifibrotic's would be indicated.  Reviewed the CT with him.  The profile does not suggest easy resolution.  Nevertheless given some rare anecdotal reports in my personal experience of patient scans improving and the fact he is feeling asymptomatic after improved acid reflux  control we took a shared decision making to repeat the CT scan in a few months and then reassess.  Any evidence of persistent ILD then will commit to antifibrotic's.  The son is a Software engineer and will look up both pirfenidone and nintedanib to be ready for discussion about this at the next visit if it came to that.    Plan:     Patient Instructions  Interstitial lung disease (HCC) GERD cough  I thnk you have IPF - based on age, negative serology, Jeff Willis gender and probable UIP on the CT chest Acid reflux is considered a risk factor Controlling acid reflux helps reduce the risk of progression but does not eliminate it Bloody acid reflux is significantly improved and her cough is resolved after doubling up your acid reflux therapy  Plan [based on shared decision making] -Okay with monitoring for the moment -Do high-resolution CT chest mid September 2021 as follow-up -Do spirometry and DLCO in mid September 2021  Follow-up -30-minute office visit in mid September 2021 after testing to see Dr. Chase Caller  -If there is any evidence of  residual interstitial lung disease then we can have a detailed discussion about starting antifibrotic's which a preventative.  The 2 months to consider pirfenidone and nintedanib  - discuss gabapenting need at followup    ( Level 05 visit: establ 40-54 min  in  visit type: on-site physical face to visit  in total care time and counseling or/and coordination of care by this undersigned MD - Dr Brand Males. This includes one or more of the following on this same day 11/15/2019: pre-charting, chart review, note writing, documentation discussion of test results, diagnostic or treatment recommendations, prognosis, risks and benefits of management options, instructions, education, compliance or risk-factor reduction. It excludes time spent by the [facility] or office staff in the care of the patient. Actual time 18 min)    SIGNATURE    Dr. Brand Males, M.D.,  F.C.C.P,  Pulmonary and Critical Care Medicine Staff Physician, [place] Director - Interstitial Lung Disease  Program  Pulmonary [place] at [place], [place], [address]  Pager: [phone], If no answer or between  15:00h - 7:00h: call [phone] Telephone: [phone]  5:43 PM 11/15/2019

## 2019-11-15 NOTE — Patient Instructions (Addendum)
Interstitial lung disease (HCC) GERD cough  I thnk you have IPF - based on age, negative serology, male gender and probable UIP on the CT chest Acid reflux is considered a risk factor Controlling acid reflux helps reduce the risk of progression but does not eliminate it Bloody acid reflux is significantly improved and her cough is resolved after doubling up your acid reflux therapy  Plan [based on shared decision making] -Okay with monitoring for the moment -Do high-resolution CT chest mid September 2021 as follow-up -Do spirometry and DLCO in mid September 2021  Follow-up -30-minute office visit in mid September 2021 after testing to see Dr. Chase Caller  -If there is any evidence of residual interstitial lung disease then we can have a detailed discussion about starting antifibrotic's which a preventative.  The 2 months to consider pirfenidone and nintedanib  - discuss gabapenting need at followup

## 2019-12-16 ENCOUNTER — Telehealth: Payer: Self-pay | Admitting: Internal Medicine

## 2019-12-16 NOTE — Telephone Encounter (Signed)
   Call from [name] of GI to discuss patient symptms and conditions. Reports that family indicates patient does not have ILD/IPF. Expressed to [name] that patient does have ILD but given improvement in cough we are adopting an expectant approach to see if pulmonary findings have spontenously improved and patient does not have ILD. Followup is all schedule for mid-sept 2021     SIGNATURE    Dr. Brand Males, M.D., F.C.C.P,  Pulmonary and Critical Care Medicine Staff Physician, [place] Director - Interstitial Lung Disease  Program  Pulmonary [place] [facility] [place], [place], [address]  Pager: [phone], If no answer  OR between  19:00-7:00h: page [phone] Telephone (clinical office): [phone] Telephone (research): [phone]  3:45 PM 12/16/2019

## 2019-12-27 NOTE — Telephone Encounter (Signed)
PFT are c/w presence of ILD - mild severity  We will discuss more at next followup

## 2019-12-27 NOTE — Telephone Encounter (Signed)
mychart message sent by pt requesting to know the results of the PFT which was performed 6/9. Pt had an OV with MR 7/19 and it does not look like the PFT was discussed with pt during that visit. MR, please advise.

## 2020-01-13 ENCOUNTER — Other Ambulatory Visit: Payer: Self-pay

## 2020-01-13 ENCOUNTER — Ambulatory Visit (INDEPENDENT_AMBULATORY_CARE_PROVIDER_SITE_OTHER)
Admission: RE | Admit: 2020-01-13 | Discharge: 2020-01-13 | Disposition: A | Discharge status: Home / Self Care | Payer: Medicare Other | Source: Ambulatory Visit | Attending: Internal Medicine | Admitting: Internal Medicine

## 2020-01-13 DIAGNOSIS — J849 Interstitial pulmonary disease, unspecified: Secondary | ICD-10-CM | POA: Diagnosis not present

## 2020-01-17 ENCOUNTER — Ambulatory Visit: Payer: Medicare Other | Admitting: Internal Medicine

## 2020-01-17 ENCOUNTER — Ambulatory Visit (INDEPENDENT_AMBULATORY_CARE_PROVIDER_SITE_OTHER): Payer: Medicare Other | Admitting: Internal Medicine

## 2020-01-17 ENCOUNTER — Encounter: Payer: Self-pay | Admitting: Internal Medicine

## 2020-01-17 ENCOUNTER — Other Ambulatory Visit: Payer: Self-pay

## 2020-01-17 VITALS — BP 126/68 | HR 127 | Temp 97.3°F | Ht 67.0 in | Wt 134.8 lb

## 2020-01-17 DIAGNOSIS — R432 Parageusia: Secondary | ICD-10-CM

## 2020-01-17 DIAGNOSIS — J849 Interstitial pulmonary disease, unspecified: Secondary | ICD-10-CM

## 2020-01-17 DIAGNOSIS — R634 Abnormal weight loss: Secondary | ICD-10-CM

## 2020-01-17 DIAGNOSIS — R05 Cough: Secondary | ICD-10-CM | POA: Diagnosis not present

## 2020-01-17 DIAGNOSIS — J84112 Idiopathic pulmonary fibrosis: Secondary | ICD-10-CM | POA: Diagnosis not present

## 2020-01-17 DIAGNOSIS — R053 Chronic cough: Secondary | ICD-10-CM

## 2020-01-17 LAB — BASIC METABOLIC PANEL
BUN: 10 mg/dL (ref 6–23)
CO2: 29 mEq/L (ref 19–32)
Calcium: 10.4 mg/dL (ref 8.4–10.5)
Chloride: 103 mEq/L (ref 96–112)
Creatinine, Ser: 1.05 mg/dL (ref 0.40–1.50)
GFR: 68.21 mL/min (ref >60.00)
Glucose, Bld: 96 mg/dL (ref 70–99)
Potassium: 3.9 mEq/L (ref 3.5–5.1)
Sodium: 139 mEq/L (ref 135–145)

## 2020-01-17 LAB — CBC
HCT: 45.3 % (ref 39.0–52.0)
Hemoglobin: 15.4 g/dL (ref 13.0–17.0)
MCHC: 34.1 g/dL (ref 30.0–36.0)
MCV: 95.4 fl (ref 78.0–100.0)
Platelets: 205 10*3/uL (ref 150.0–400.0)
RBC: 4.75 Mil/uL (ref 4.22–5.81)
RDW: 12.9 % (ref 11.5–15.5)
WBC: 8.9 10*3/uL (ref 4.0–10.5)

## 2020-01-17 LAB — HEPATIC FUNCTION PANEL
ALT: 16 U/L (ref 0–53)
AST: 22 U/L (ref 0–37)
Albumin: 4.5 g/dL (ref 3.5–5.2)
Alkaline Phosphatase: 55 U/L (ref 39–117)
Bilirubin, Direct: 0.1 mg/dL (ref 0.0–0.3)
Total Bilirubin: 0.5 mg/dL (ref 0.2–1.2)
Total Protein: 8 g/dL (ref 6.0–8.3)

## 2020-01-17 NOTE — Patient Instructions (Addendum)
ICD-10-CM   1. IPF (idiopathic pulmonary fibrosis) ([place])  J84.112   2. Interstitial lung disease ([place])  J84.9   3. Chronic cough  R05   4. Weight loss, non-intentional  R63.4   5. [place]  R43.2     Interstitial lung disease:  -It is still there -Overall stable in the last 3 months although radiologist thinks slightly worse -The diagnosis is IPF  Plan -Okay check liver function test today, CBC and chemistry  -Given weight loss and loss of taste issues -we will go with nintedanib 100 mg twice daily [this is a lower dose] as first-line  -We will start paperwork for this  Cough:  -Due to pulmonary fibrosis and acid reflux  Plan  -Continue acid reflux control -We will address more of this at follow-up  Weight loss: -You are having weight loss.  This can be because of pulmonary fibrosis but typically this is seen in more advanced ages then in early stages.  Plan  -= Talk to primary care physician and investigate other reasons including any potential cancers for weight loss  Travel advice encounter  -Noted travel to [place] on March 05, 2020 through February 2022  Plan   -I will need to establish contact with the pulmonologist in [place] to ensure proper coordination of care -At some point during next visit please get me the name and contact phone number/email of the pulmonologist  Follow-up -In 3-4 weeks to see how the nintedanib start has gone on  -Liver function test, ILD symptom questionnaire and simple walk test at follow-up

## 2020-01-17 NOTE — Progress Notes (Signed)
Spirometry and Dlco done today. 

## 2020-01-17 NOTE — Progress Notes (Signed)
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   Prior cough in  2019 for several months  resolved on on some kind of powder inhaler x one week = Advair     History of Present Illness  08/04/2019  Pulmonary/ 1st office eval/Jeff Willis  Chief Complaint  Patient presents with  . Pulmonary Consult    Referred by Jeff Jeff Willis. Pt c/o non prod cough and throat clearing x 6 months.   Dyspnea:  None/ no aerobics, walking neighborhoods ok  Cough: always dry/ any candy helps if keeps in mouth but esp hard candy / does not use cough drops  Sleep: rotates on side, bed is flat/ one pillow, never wakes up cough but /win 15 min of stirring recurs daily  SABA use: never, also never prednisone Always trouble swallowing pills x lifetime [name] Reflux v Neurogenic Cough Differentiator Reflux Comments  Do you awaken from a sound sleep coughing violently?                            With trouble breathing? none   Do you have choking episodes when you cannot  Get enough air, gasping for air ?              none   Do you usually cough when you lie down into  The bed, or when you just lie down to rest ?                          No    Do you usually cough after meals or eating?         Yes after bfast   Do you cough when (or after) you bend over?    no   GERD SCORE     [name] Reflux v Neurogenic Cough Differentiator Neurogenic   Do you more-or-less cough all day long? sporadic   Does change  of temperature make you cough? None    Does laughing or chuckling cause you to cough? yes   Do fumes (perfume, automobile fumes, burned  Toast, etc.,) cause you to cough ?      Yes    Does speaking, singing, or talking on the phone cause you to cough   ?               Yes    Neurogenic/Airway score     rec Call Jeff Jeff Willis office and ask what inhaler you received in 2019 as sample that seemed to help your cough = advair dpi Stop cetrizine, flonase  Prednisone 10 mg take  4 each am x 2 days,   2 each am x 2 days,  1 each am x 2 days and stop  Pantoprazole (protonix) 40 mg   Take  30-60 min before first  meal of the day and Pepcid (famotidine)  20 mg one after supper  until return to office  GERD   Gabapentin 100 mg four times daily (ok to stop when no coughing at all for a week)  Please schedule a follow up office visit in 4 weeks, sooner if needed  with all medications /inhalers/ solutions in hand so we can verify exactly what you are taking. This includes all medications from all doctors and over the counters    09/06/2019  f/u ov/Jeff Willis re: uacs on gabapenitn 100 mg three times  wolicki had tried gabapentin but failed to reveal that at last ov when I recommended it  Chief Complaint  Patient presents with  . Follow-up    4 wk f/u. Wants to discuss switching Advair.   Dyspnea:  No  Cough: better just while on prednisone maybe 50-80%   Sleeping: no noct cough SABA use: none 02: none  rec Gabapentin 200 mg up to 4 x daily  Pantoprazole (protonix) 40 mg   Take  30-60 min before first meal of the day and Pepcid (famotidine)  20 mg one @  bedtime until return to office - this is the best way to tell whether stomach acid is contributing to your problem.   Keep the candy handy  Prednisone 10 mg  Take 4 for three days 3 for three days 2 for three days 1 for three days and stop  If not better in 2 weeks see Jeff Willis for your reflux  Please remember to go to the lab department and xray dept  for  your tests - we will call you with the results when they are available.         OV 10/05/2019  Subjective:  [name], male , DOB: 04/02/1941 , age 13 y.o. , MRN: [id number] , ADDRESS: [place] [address]   10/05/2019 -   Chief Complaint  Patient presents with  . Follow-up    Pt states he has been doing okay since last visit and denies any complaints.   -79 year old [place] male.  Referred for interstitial lung disease.  He is accompanied by his son Jeff Willis who is a Software engineer.  I know Jeff Willis  from a few years ago when heused to accompany his aunt; asthma patient of mine. Jeff Willis  has has been referred by Jeff. Christinia Willis to the ILD center.  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  Up until the pandemic used to travel quite frequently.  He is to go to [place] for 3 months.  At none of the properties he is being exposed to mold.  He developed insidious onset of chronic cough over a year ago this has been persistent.  In the last few months several cough related treatments have been tried with some improvement.  Has been on gabapentin but this made him sleepy and is currently stopped it.  Because of chronic cough which he only attributes his constant clearing of the throat he had a high-resolution CT scan of the chest that I personally visualized and shows probable UIP pattern and therefore he has been referred here.  In my personal visualization it might even be definite of UIP because there might be some early honeycombing there.  He denies any shortness of breath.  At rest he was found to be tachycardic but he says this can at baseline for him.  He will follow this up with his primary care  physician.  [place] [facility]( ILD Questionnaire  Symptoms:   He denies any shortness of breath.  No episodic shortness of breath.  However on the questioning he does report mild shortness of breath climbing up stairs.  In  terms of his cough it started in June 2020.  It is the same since it started maybe in the last week it is slightly better.  Is moderate in intensity.  He does clear the throat.  Mostly dry cough occasionally white sputum.  Does feel a tickle in the back of his throat.  He is not waking up in the middle of the night.  Occasionally affects his voice.  No nausea no vomiting no diarrhea no wheezing.  SYMPTOM SCALE - ILD 10/05/2019   O2 use ra  Shortness of Breath 0 -> 5 scale with 5 being worst (score 6 If unable to do)  At rest 0  Simple tasks - showers, clothes change, eating, shaving 0  Household (dishes, doing bed, laundry) 0  Shopping 0  Walking level at own pace 0  Walking up Stairs 1  Total (30-36) Dyspnea Score 1  How bad is your cough? 2  How bad is your fatigue 0  How bad is nausea 0  How bad is vomiting?  00  How bad is diarrhea? 0  How bad is anxiety? 00  How bad is depression 0      Past Medical History : Denies any asthma COPD or heart failure rheumatoid arthritis or scleroderma or lupus or polymyositis or Sjogren's.  Denies sleep apnea.  Denies HIV denies pulmonary hypertension.  Denies diabetes or thyroid disease or stroke or seizures mononucleosis.  Denies any hepatitis or tuberculosis or kidney disease or pneumonia or blood clots or heart disease or pleurisy.  Does have on and off acid reflux for the last year   ROS: Positive for dry mouth for the last several months.  He also has some dysphagia for swallowing pills unclear for what duration.  He also has acid reflux on PPI and H2 blocker.  Denies any Raynaud's or recurrent fever or weight loss.  No nausea no vomiting.   FAMILY HISTORY of LUNG DISEASE: * -Denies family history of COPD or asthma sarcoid or cystic fibrosis of hypersensitive pneumonitis or autoimmune disease.   EXPOSURE HISTORY: Denies any Covid.  Denies smoking cigarettes.  Denies marijuana denies smoking.  Denies IV drug use denies cocaine   HOME and  HOBBY DETAILS : Single-family home [place] for the last 10 years.  Age of the home is 15 years.  No dampness.  No mildew.  No humidifier use.  No CPAP use no nebulizer machine use.  He does use a steam iron but there is no mold or mildew in it.  There is no [place] inside the house no pet birds or parakeets.  No pet gerbils no feather pillows no mold in the [facility] duct no music habits.  He does do some gardening very small garden in the house limited gardening mostly just water the garden.  No bird feather exposure no flood damage no strong mats no hot tub or Jacuzzi.  No exposure to animals at work.   OCCUPATIONAL HISTORY (122 questions) :   -Organic antigen exposure is negative inorganic exposure is negative   PULMONARY TOXICITY HISTORY (27 items):  -18-day prednisone course that ended last week.  He did have seen implantation for prostate cancer in October 2019 these were radiation seeds.  Results for GAMBLE, ENDERLE (MRN [id number]) as of 11/15/2019 16:27  Ref. Range 09/07/2019 11:29 10/05/2019 11:25  Anti Nuclear Antibody (ANA) Latest Ref Range: NEGATIVE   POSITIVE (A)  ANA Pattern 1 Unknown  Nuclear, Speckled (A)  ANA Titer 1 Latest Units: titer  1:40 (H)  Angiotensin-Converting Enzyme Latest Ref Range: 9 - 67 U/L  34  Cyclic Citrullin Peptide Ab Latest Units: UNITS  <16  ds DNA Ab Latest Units: IU/mL  <1  Myeloperoxidase Abs Latest Units: AI  <1.0  Serine Protease 3 Latest Units: AI  <1.0  RA Latex Turbid. Latest Ref Range: <14 IU/mL  <14  IgE (Immunoglobulin E), Serum Latest Ref Range: <OR=114 kU/L 36   SSA (Ro) (ENA) Antibody, IgG Latest Ref Range: <1.0 NEG AI  <1.0 NEG  SSB (La) (ENA) Antibody, IgG Latest Ref Range: <1.0 NEG AI  <1.0 NEG  Scleroderma (Scl-70) (ENA) Antibody, IgG Latest Ref Range: <1.0 NEG AI  <1.0 NEG     Simple office walk 185 feet x  3 laps goal with forehead probe 10/05/2019   O2 used ra  Number laps completed 3  Comments about pace avt   Resting Pulse Ox/HR 100% and 122/min  Final Pulse Ox/HR 100% and 143/min  Desaturated </= 88% x  Desaturated <= 3% points x  Got Tachycardic >/= 90/min x  Symptoms at end of test x  Miscellaneous comments x    Results for GANON, DEMASI (MRN [id number]) as of 10/05/2019 10:40  Ref. Range 09/07/2019 11:29  Eosinophils Absolute Latest Ref Range: 0.0 - 0.7 K/uL 0.1     High-resolution CT chest Sep 15, 2019: Personally visualized and agree with the findings below IMPRESSION: 1. Spectrum of findings compatible with basilar predominant fibrotic interstitial lung disease with suggestion of early honeycombing. 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 [place], Iss 5, [phone], Dec 28 2016. 2. Ectatic 4.4 cm ascending thoracic aorta. Recommend annual imaging followup by CTA or MRA. This recommendation follows 2010 ACCF/AHA/AATS/ACR/ASA/SCA/SCAI/SIR/STS/SVM Guidelines for the Diagnosis and Management of Patients with Thoracic Aortic Disease. Circulation. 2010; 121: S568-L275. Aortic aneurysm NOS (ICD10-I71.9). 3. One vessel coronary atherosclerosis. 4. Aortic Atherosclerosis (ICD10-I70.0).   Electronically Signed   By: Ilona Sorrel M.D.   On: 09/15/2019 14:26    Results for TALBOT, MONARCH (MRN [id number]) as of 10/05/2019 10:40  Ref. Range 09/07/2019 11:29  Sheep Sorrel IgE Latest Units: kU/L <0.10  Pecan/Hickory Tree IgE Latest Units: kU/L <0.10  IgE (Immunoglobulin E), Serum Latest Ref Range: <OR=114 kU/L 36  Allergen, D pternoyssinus,d7 Latest Units: kU/L <0.10  Cat Dander Latest Units: kU/L <0.10  Dog Dander Latest Units: kU/L <0.10  [place] Grass Latest Units: kU/L <0.10  Johnson Grass Latest Units: kU/L <0.10  Timothy Grass Latest Units: kU/L <0.10  Cockroach Latest Units: kU/L <0.10  Aspergillus fumigatus,  m3 Latest Units: kU/L <0.10  Allergen, Comm Silver Wendee Copp, t9 Latest Units: kU/L <0.10  Allergen, Cottonwood, t14 Latest Units: kU/L <0.10  Elm IgE Latest Units: kU/L <0.10  Allergen, Mulberry, t76 Latest Units: kU/L <0.10  Allergen, Oak,t7 Latest Units: kU/L <0.10  COMMON RAGWEED (SHORT) (W1) IGE Latest Units: kU/L 0.15 (H)  Allergen, Mouse Urine Protein, e78 Latest Units: kU/L <0.10  D. farinae Latest Units: kU/L <0.10  Allergen, Cedar tree, t12 Latest Units: kU/L <0.10  Box Elder IgE Latest Units: kU/L <0.10  Rough Pigweed  IgE Latest Units: kU/L <0.10    ROS - per HPI   OV 11/15/2019  Subjective:  [name], male , DOB: 1941-01-19 , age 20 y.o. , MRN: [id number] , ADDRESS: [place] [address]   11/15/2019 -   Chief Complaint  Patient presents with  . Follow-up    Pt states the cough has been better since last visit.      HPI [place] 79 y.o. -presents with his son Jeff Willis for follow-up.  He is here to discuss test results.  In the interim he called for worsening cough.  We gave him prednisone.  He tells me the prednisone actually made the cough worse particularly towards the end.  After that the son  doubled up his PPI and also added H2 blockade and the cough went away completely.  Patient feels completely normal that he is possible that he has interstitial lung disease.  We reviewed the scan and I visualized it with him together and his son.  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  They wanted to discuss the implications of the scan in the setting of controlled acid reflux and controlled cough but he is largely asymptomatic at this point.  Noted his serologies were negative except for trace ANA and his overnight oxygen study was normal.     OV 01/17/2020   Subjective:  [name], male , DOB: April 14, 1941, age 19 y.o. years. , MRN: [id number],  ADDRESS: [place] [address] PCP  Gaynelle Arabian,  MD Providers : Treatment Team:  Attending Provider: Brand Males, MD   Chief Complaint  Patient presents with  . Follow-up    pt is here to go over ct and pft    Follow-up interstitial lung disease Follow-up associated cough partially controlled with acid reflux treatment Associated weight loss present   HPI Raul G [place] 79 y.o. -presents with his son Jeff Willis.  In the interim he is continue to lose weight.  He has lost another 3-4 pounds since June 2021.  His clothes are much looser.  He thinks he has been steadily losing weight.  His respiratory symptoms are not any worse although he says that his cough that seem to initially improve with doubling up of acid reflux therapy seems to persist.  At last visit in September 2020 when I thought the cough resolved but they tell me the cough only partially improved.  Nevertheless it is persisting and they are worried about it.  In addition he is worried about weight loss.  He also has poor appetite.  He is not in any pulmonary fibrosis antifibrotic's.  There is no diarrhea.  Symptom score suggest some element of anxiety since his last visit.  He has had pulmonary function test and it shows his FVC is actually better but his DLCO is actually worse.  He had high-resolution CT chest that I visualized and reviewed with him.  It shows probable UIP with possible emerging honeycombing.  I agree with the radiologist.  The radiologist actually feels that it might be a little bit worse compared to few months ago.  Of note: He has upcoming trip to [place] between March 05, 2020 and February 2022.  He wants to make sure his care is coordinated.  He is identified a pulmonologist in his home city of [place] - ILD 10/05/2019 137 pounds 01/17/2020 134 pounds  O2 use ra ra  Shortness of Breath 0 ->  5 scale with 5 being worst (score 6 If unable to do)   At rest 0 0  Simple tasks - showers, clothes change, eating, shaving 0 0   Household (dishes, doing bed, laundry) 0 1  Shopping 0 0  Walking level at own pace 0 0  Walking up Stairs 1 0  Total (30-36) Dyspnea Score 1 1  How bad is your cough? 2 3  How bad is your fatigue 0 0  How bad is nausea 0 0  How bad is vomiting?  00 0  How bad is diarrhea? 0 0  How bad is anxiety? 00 1  How bad is depression 0 1       Simple office walk 185 feet x  3 laps goal with forehead probe 10/05/2019    O2 used ra   Number laps completed 3   Comments about pace avt   Resting Pulse Ox/HR 100% and 122/min   Final Pulse Ox/HR 100% and 143/min   Desaturated </= 88% x   Desaturated <= 3% points x   Got Tachycardic >/= 90/min x   Symptoms at end of test x   Miscellaneous comments x     PFT Results Latest Ref Rng & Units 01/17/2020 10/06/2019  FVC-Pre L - 2.59  FVC-Predicted Pre % 83 78  Pre FEV1/FVC % % 86 86  FEV1-Pre L 2.37 2.24  FEV1-Predicted Pre % 95 89  DLCO uncorrected ml/min/mmHg 13.79 15.70  DLCO UNC% % 48 55  DLCO corrected ml/min/mmHg 13.79 15.70  DLCO COR %Predicted % 48 55  DLVA Predicted % 81 97   CT chest hig resoution 9/16?21  CLINICAL DATA:  79 year old male with history of shortness of breath. Evaluate for pulmonary fibrosis.  EXAM: CT CHEST WITHOUT CONTRAST  TECHNIQUE: Multidetector CT imaging of the chest was performed following the standard protocol without intravenous contrast. High resolution imaging of the lungs, as well as inspiratory and expiratory imaging, was performed.  COMPARISON:  Chest CT 09/15/2019.  FINDINGS: Cardiovascular: Heart size is normal. There is no significant pericardial fluid, thickening or pericardial calcification. There is aortic atherosclerosis, as well as atherosclerosis of the great vessels of the mediastinum and the coronary arteries, including calcified atherosclerotic plaque in the left main, left anterior descending and right coronary arteries. Dilatation of the ascending thoracic aorta  measuring 4.5 cm in diameter.  Mediastinum/Nodes: No pathologically enlarged mediastinal or hilar lymph nodes. Please note that accurate exclusion of hilar adenopathy is limited on noncontrast CT scans. Esophagus is unremarkable in appearance. No axillary lymphadenopathy.  Lungs/Pleura: High-resolution images demonstrate widespread areas of ground-glass attenuation, septal thickening, thickening of the peribronchovascular interstitium, mild cylindrical bronchiectasis and peripheral bronchiolectasis. There is a suggestion of, but not definitive evidence of, early honeycombing in portions of the lungs. These findings do have a definitive craniocaudal gradient and appear slightly progressive compared to the prior study. Inspiratory and expiratory imaging is unremarkable. No acute consolidative airspace disease. No pleural effusions. No definite suspicious appearing pulmonary nodules or masses are noted.  Upper Abdomen: Cortical calcification in the upper pole of the right kidney incidentally noted.  Musculoskeletal: There are no aggressive appearing lytic or blastic lesions noted in the visualized portions of the skeleton.  IMPRESSION: 1. The appearance of the lungs is compatible with progressive interstitial lung disease categorized as probable usual interstitial pneumonia (UIP) per current ATS guidelines. 2. Aortic atherosclerosis, in addition to left main and 2 vessel coronary artery disease. In addition, there is mild aneurysmal dilatation of  the ascending thoracic aorta (4.5 cm in diameter). Ascending thoracic aortic aneurysm. Recommend semi-annual imaging followup by CTA or MRA and referral to cardiothoracic surgery if not already obtained. This recommendation follows 2010 ACCF/AHA/AATS/ACR/ASA/SCA/SCAI/SIR/STS/SVM Guidelines for the Diagnosis and Management of Patients With Thoracic Aortic Disease. Circulation. 2010; 121: N829-F621. Aortic aneurysm  NOS (ICD10-I71.9).  Aortic Atherosclerosis (ICD10-I70.0). Aortic aneurysm NOS (ICD10-I71.9).   Electronically Signed   By: Vinnie Langton M.D.   On: 01/13/2020 15:15   ROS - per HPI    has a past medical history of Back pain, Elevated PSA, Essential tremor, Hypercholesteremia, Neuropathy, Prostate cancer ([place]), and Sinus tachycardia (01/30/2018).   reports that he has never smoked. He has never used smokeless tobacco.  Past Surgical History:  Procedure Laterality Date  . LUMBAR MICRODISCECTOMY     L4-5  . RADIOACTIVE SEED IMPLANT N/A 02/24/2018   Procedure: RADIOACTIVE SEED IMPLANT/BRACHYTHERAPY IMPLANT;  Surgeon: Festus Aloe, MD;  [facility];  Service: Urology;  Laterality: N/A;  . SPACE OAR INSTILLATION N/A 02/24/2018   Procedure: SPACE OAR INSTILLATION;  Surgeon: Festus Aloe, MD;  [facility];  Service: Urology;  Laterality: N/A;    No Known Allergies  Immunization History  Administered Date(s) Administered  . Influenza, High Dose Seasonal PF 01/28/2019  . Influenza-Unspecified 01/05/2020  . PFIZER SARS-COV-2 Vaccination 07/23/2019, 08/17/2019  . Pneumococcal-Unspecified 02/28/2015    Family History  Problem Relation Age of Onset  . Other Mother        died at 24 of natural causes  . Other Father        unsure of health  . Cancer Neg Hx      Current Outpatient Medications:  .  Cholecalciferol (VITAMIN D3 PO), Take 1 tablet by mouth every 30 (thirty) days., Disp: , Rfl:  .  famotidine (PEPCID) 20 MG tablet, One after supper (Patient taking differently: 40 mg. One after supper), Disp: 30 tablet, Rfl: 11 .  gabapentin (NEURONTIN) 100 MG capsule, 2 pills four time a day (Patient taking differently: 2 pills four time a day as needed), Disp: 240 capsule, Rfl: 2 .  pantoprazole (PROTONIX) 40 MG tablet, TAKE ONE TABLET BY MOUTH DAILY TAKE 30 TO 60 MINUTES PRIOR TO FIRST MEAL OF THE DAY, Disp: 30  tablet, Rfl: 3 .  vitamin B-12 (CYANOCOBALAMIN) 1000 MCG tablet, Take 1,000 mcg by mouth every Monday, Wednesday, and Friday. , Disp: , Rfl:       Objective:   Vitals:   01/17/20 1127  BP: 126/68  Pulse: (!) 127  Temp: (!) 97.3 F (36.3 C)  TempSrc: Oral  SpO2: 95%  Weight: 134 lb 12.8 oz (61.1 kg)  Height: 5\' 7"  (1.702 m)    Estimated body mass index is 21.11 kg/m as calculated from the following:   Height as of this encounter: 5\' 7"  (1.702 m).   Weight as of this encounter: 134 lb 12.8 oz (61.1 kg).  @WEIGHTCHANGE @  [employer]   01/17/20 1127  Weight: 134 lb 12.8 oz (61.1 kg)     Physical Exam  General Appearance:    Alert, cooperative, no distress, appears stated age - yes , Deconditioned looking - no , OBESE  - no, Sitting on Wheelchair -  no  Head:    Normocephalic, without obvious abnormality, atraumatic  Eyes:    PERRL, conjunctiva/corneas clear,  Ears:    Normal TM's and external ear canals, both ears  Nose:   Nares normal, septum midline, mucosa normal, no  drainage    or sinus tenderness. OXYGEN ON  - no . Patient is @ ra   Throat:   Lips, mucosa, and tongue normal; teeth and gums normal. Cyanosis on lips - no  Neck:   Supple, symmetrical, trachea midline, no adenopathy;    thyroid:  no enlargement/tenderness/nodules; no carotid   bruit or JVD  Back:     Symmetric, no curvature, ROM normal, no CVA tenderness  Lungs:     Distress - no , Wheeze no, Barrell Chest - no, Purse lip breathing - no, Crackles - mild   Chest Wall:    No tenderness or deformity.    Heart:    Regular rate and rhythm, S1 and S2 normal, no rub   or gallop, Murmur - no  Breast Exam:    NOT DONE  Abdomen:     Soft, non-tender, bowel sounds active all four quadrants,    no masses, no organomegaly. Visceral obesity - no  Genitalia:   NOT DONE  Rectal:   NOT DONE  Extremities:   Extremities - normal, Has Cane - no, Clubbing - no, Edema - no  Pulses:   2+ and symmetric all extremities   Skin:   Stigmata of Connective Tissue Disease - no  Lymph nodes:   Cervical, supraclavicular, and axillary nodes normal  Psychiatric:  Neurologic:   Pleasant - yes, Anxious - no, Flat affect - no  CAm-ICU - neg, Alert and Oriented x 3 - yes, Moves all 4s - yes, Speech - normal, Cognition - intact           Assessment:       ICD-10-CM   1. IPF (idiopathic pulmonary fibrosis) (HCC)  J84.112 Hepatic function panel    CBC    Basic Metabolic Panel (BMET)    Basic Metabolic Panel (BMET)    CBC    Hepatic function panel  2. Interstitial lung disease (HCC)  J84.9 Hepatic function panel    CBC    Basic Metabolic Panel (BMET)    Basic Metabolic Panel (BMET)    CBC    Hepatic function panel  3. Chronic cough  R05 Hepatic function panel    CBC    Basic Metabolic Panel (BMET)    Basic Metabolic Panel (BMET)    CBC    Hepatic function panel  4. Weight loss, non-intentional  R63.4 Hepatic function panel    CBC    Basic Metabolic Panel (BMET)    Basic Metabolic Panel (BMET)    CBC    Hepatic function panel  5. Ageusia  R43.2 Hepatic function panel    CBC    Basic Metabolic Panel (BMET)    Basic Metabolic Panel (BMET)    CBC    Hepatic function panel       Plan:     Patient Instructions     ICD-10-CM   1. IPF (idiopathic pulmonary fibrosis) ([place])  J84.112   2. Interstitial lung disease ([place])  J84.9   3. Chronic cough  R05   4. Weight loss, non-intentional  R63.4   5. [place]  R43.2     Interstitial lung disease:  -It is still there -Overall stable in the last 3 months although radiologist thinks slightly worse -The diagnosis is IPF  Plan -Okay check liver function test today, CBC and chemistry  -Given weight loss and loss of taste issues -we will go with nintedanib 100 mg twice daily [this is a lower dose] as first-line  -We will start  paperwork for this  Cough:  -Due to pulmonary fibrosis and acid reflux  Plan  -Continue acid reflux control -We will  address more of this at follow-up  Weight loss: -You are having weight loss.  This can be because of pulmonary fibrosis but typically this is seen in more advanced ages then in early stages.  Plan  -= Talk to primary care physician and investigate other reasons including any potential cancers for weight loss  Travel advice encounter  -Noted travel to [place] on March 05, 2020 through February 2022  Plan   -I will need to establish contact with the pulmonologist in [place] to ensure proper coordination of care -At some point during next visit please get me the name and contact phone number/email of the pulmonologist  Follow-up -In 3-4 weeks to see how the nintedanib start has gone on  -Liver function test, ILD symptom questionnaire and simple walk test at follow-up   ( Level 05 visit: Estb 40-54 min   in  visit type: on-site physical face to visit  in total care time and counseling or/and coordination of care by this undersigned MD - Jeff Brand Males. This includes one or more of the following on this same day 01/17/2020: pre-charting, chart review, note writing, documentation discussion of test results, diagnostic or treatment recommendations, prognosis, risks and benefits of management options, instructions, education, compliance or risk-factor reduction. It excludes time spent by the [place] or office staff in the care of the patient. Actual time 52 min)   SIGNATURE    Jeff. Brand Males, M.D., F.C.C.P,  Pulmonary and Critical Care Medicine Staff Physician, [place] Director - Interstitial Lung Disease  Program  Pulmonary [place] [facility] [place], [place], [address]  Pager: [phone], If no answer or between  15:00h - 7:00h: call [phone] Telephone: [phone]  2:59 PM 01/17/2020

## 2020-01-18 ENCOUNTER — Telehealth: Payer: Self-pay | Admitting: Pharmacy Technician

## 2020-01-18 NOTE — Telephone Encounter (Signed)
CBC, BMET, LFT - normal ahead of starting antibirotic  Plan  - inform patient of above  - send results to PCP Gaynelle Arabian, MD - pharmacy to work on anti fibrotic for IPF -let him know I have sent message to PCP about his weight loss    LABS    PULMONARY No results for input(s): PHART, PCO2ART, PO2ART, HCO3, TCO2, O2SAT in the last 168 hours.  Invalid input(s): PCO2, PO2  CBC Recent Labs  Lab 01/17/20 1303  HGB 15.4  HCT 45.3  WBC 8.9  PLT 205.0    COAGULATION No results for input(s): INR in the last 168 hours.  CARDIAC  No results for input(s): TROPONINI in the last 168 hours. No results for input(s): PROBNP in the last 168 hours.   CHEMISTRY Recent Labs  Lab 01/17/20 1303  NA 139  K 3.9  CL 103  CO2 29  GLUCOSE 96  BUN 10  CREATININE 1.05  CALCIUM 10.4   Estimated Creatinine Clearance: 50.1 mL/min (by C-G formula based on SCr of 1.05 mg/dL).   LIVER Recent Labs  Lab 01/17/20 1303  AST 22  ALT 16  ALKPHOS 55  BILITOT 0.5  PROT 8.0  ALBUMIN 4.5     INFECTIOUS No results for input(s): LATICACIDVEN, PROCALCITON in the last 168 hours.   ENDOCRINE CBG (last 3)  No results for input(s): GLUCAP in the last 72 hours.       IMAGING x48h  - image(s) personally visualized  -   highlighted in bold No results found.

## 2020-01-18 NOTE — Telephone Encounter (Signed)
Dr. Chase Caller please advise on following pt message. Thank you.  Please call me regarding my blood work report  I would like to understand and any further treatment  Thanks Jeff Willis

## 2020-01-18 NOTE — Telephone Encounter (Signed)
Received notification of New start  for OFEV. Will update as we work through the benefits process.  Submitted a Prior Authorization request to [facility] for [place] via Cover My Meds. Will update once we receive a response.   KeyMalachy Moan - PA Case ID: [id number]

## 2020-01-19 NOTE — Telephone Encounter (Signed)
Called Jeff Willis, patient does not have prescription insurance. Patient will need to complete Ofev patient assistance paperwork.  Called patient, spoke to his son, he has patient assistance application and will complete and drop off at office with income documents.   Patient's son is a Software engineer and says he will assist patient in getting prescription insurance for 2022.

## 2020-01-19 NOTE — Telephone Encounter (Signed)
Received response form [facility] that patient on has Medical benefit through them. Will call pharmacy to see if patient has any pharmacy benefits.

## 2020-01-20 NOTE — Telephone Encounter (Signed)
Mychart message sent by pt:  To: [facility]    From: ABBIE JABLON    Created: 01/20/2020 3:16 PM     *-*-*This message has not been handled.*-*-*  I would like to know about Zephyr valve  Will be helpful to me or not  Please response  Thanks  People      MR, please advise.

## 2020-01-20 NOTE — Telephone Encounter (Signed)
Received paperwork, placed in provider's box for signature.

## 2020-01-20 NOTE — Telephone Encounter (Signed)
Pt brought in [place] paperwork.  Placed in the pharmacy box.  Please advise.

## 2020-01-24 NOTE — Telephone Encounter (Signed)
Zephyr valve is for emphysema. Not for him because he has pulmonary fibrosis. He does Ofev.

## 2020-01-24 NOTE — Telephone Encounter (Signed)
Paperwork is in MR's box in [place]. This will be given to him when he returns to clinic 9/28. Will keep encounter open to follow up on.

## 2020-01-26 NOTE — Telephone Encounter (Signed)
Dr. Chase Caller, patient sent the following message:  Hi I have not heard from you regarding my [place]  My [place] has denied coverage of medicine  As per Dr Chase Caller I have to take 100 mg OFEV twice a day  I would like to know about how my medicine is going to cost me  Thanks Bruhl

## 2020-01-27 LAB — PULMONARY FUNCTION TEST
DL/VA % pred: 81 %
DL/VA: 3.57 ml/min/mmHg/L
DLCO cor % pred: 48 %
DLCO cor: 13.79 ml/min/mmHg
DLCO unc % pred: 48 %
DLCO unc: 13.79 ml/min/mmHg
FEF 25-75 Pre: 3.39 L/sec
FEF2575-%Pred-Pre: 182 %
FEV1-%Pred-Pre: 95 %
FEV1-Pre: 2.37 L
FEV1FVC-%Pred-Pre: 112 %
FEV6-Pre: 2.75 L
FVC-%Pred-Pre: 83 %
Pre FEV1/FVC ratio: 86 %
Pre FEV6/FVC Ratio: 100 %

## 2020-01-27 NOTE — Telephone Encounter (Signed)
Submitted Patient Assistance Application to [employer] for OFEV along with provider portion and income documents. Will update patient when we receive a response.  Fax# [phone] Phone# [phone]  

## 2020-01-31 NOTE — Telephone Encounter (Signed)
See phone note 01/27/20 from pharmacy tech Frederik Schmidt . They have submitted documentation to [facility] assistance. They have stated they will keep patient updated.

## 2020-01-31 NOTE — Telephone Encounter (Signed)
Rachael, please see mychart messages sent by pt and advise if you have an update on OFEV start.

## 2020-02-01 NOTE — Telephone Encounter (Signed)
MR- please advise on pt email, thanks  Hi I would like dr Chase Caller To prescribe some dry  cough liquids medicine  I do have a dry cough and clearing my throat I have to spit some semi liquid cough  Thanks  Crady

## 2020-02-01 NOTE — Telephone Encounter (Signed)
Rachael  Just saw a note from [place] that Ofev was denied because thjey did not get proof of income from patient . Sending the note to you via Gavin Potters CMA (also copied)  THanks  MR

## 2020-02-01 NOTE — Telephone Encounter (Signed)
Called [employer], rep stated that income documents submitted do not accurately reflect patient's total annual income. They spoke to patient earlier today, who advised he will gather additional income documents for submission.  Phone# [phone]

## 2020-02-01 NOTE — Telephone Encounter (Signed)
Patient submitted 1 month of pay stubs, will call to follow up.  Thanks!

## 2020-02-01 NOTE — Telephone Encounter (Signed)
Patient's Ofev application was submitted on 9/30. We have not received a response yet. It typically takes 3-4 business days to process. We will follow up on 10/7.

## 2020-02-03 NOTE — Telephone Encounter (Signed)
Received a fax from  [facility] regarding an Approval for [place] patient assistance from 02/02/20 to 04/28/20.   Phone number: [phone]

## 2020-02-04 ENCOUNTER — Telehealth: Payer: Self-pay | Admitting: Internal Medicine

## 2020-02-04 NOTE — Telephone Encounter (Signed)
Spoke with the pt  He states that MR rec that he take ofevn 100 mg bid  The pharmacy called pt and advised they will be shipping him the 150 mg tablets  There is another msg regarding this dated today  Will close this and work off of that encounter  Pt aware we will call pharm to get the straightened out   Per last ov note with MR:  Plan -Okay check liver function test today, CBC and chemistry  -Given weight loss and loss of taste issues -we will go with nintedanib 100 mg twice daily [this is a lower dose] as first-line             -We will start paperwork for this

## 2020-02-04 NOTE — Telephone Encounter (Signed)
Per 01/17/20 note from MR:  [place] check liver function test today, CBC and chemistry  -Given weight loss and loss of taste issues -we will go with nintedanib 100 mg twice daily [this is a lower dose] as first-line             -We will start paperwork for this  Called pharm and was placed on hold over 5 min, will call back later

## 2020-02-07 NOTE — Telephone Encounter (Signed)
Pt calling back about his ofev still waiting [phone]

## 2020-02-08 MED ORDER — NINTEDANIB ESYLATE 100 MG PO CAPS: 100.0000 mg | ORAL_CAPSULE | Freq: Two times a day (BID) | ORAL | 3 refills | Status: DC

## 2020-02-08 NOTE — Telephone Encounter (Signed)
Called [employer] pharm and gave verbal order for the generic OFEV 100 mg bid  I spoke with the pt and made aware that this was done  Nothing further needed

## 2020-02-08 NOTE — Telephone Encounter (Signed)
Pt returning a phone call. Pt can be reached at [phone]

## 2020-02-09 ENCOUNTER — Ambulatory Visit: Payer: Medicare Other | Admitting: Internal Medicine

## 2020-02-11 ENCOUNTER — Other Ambulatory Visit: Payer: Self-pay | Admitting: Family Medicine

## 2020-02-11 DIAGNOSIS — R630 Anorexia: Secondary | ICD-10-CM

## 2020-02-14 ENCOUNTER — Other Ambulatory Visit: Payer: Self-pay

## 2020-02-14 ENCOUNTER — Ambulatory Visit
Admission: RE | Admit: 2020-02-14 | Discharge: 2020-02-14 | Disposition: A | Discharge status: Home / Self Care | Payer: Medicare Other | Source: Ambulatory Visit | Attending: Family Medicine | Admitting: Family Medicine

## 2020-02-14 DIAGNOSIS — R630 Anorexia: Secondary | ICD-10-CM

## 2020-02-14 MED ORDER — IOPAMIDOL (ISOVUE-300) INJECTION 61%
100.0000 mL | Freq: Once | INTRAVENOUS | Status: AC | PRN
  Administered 2020-02-14: 100 mL via INTRAVENOUS

## 2020-02-17 ENCOUNTER — Telehealth: Payer: Self-pay | Admitting: Internal Medicine

## 2020-02-17 MED ORDER — NINTEDANIB ESYLATE 100 MG PO CAPS: 100.0000 mg | ORAL_CAPSULE | Freq: Two times a day (BID) | ORAL | 3 refills | Status: DC

## 2020-02-17 NOTE — Telephone Encounter (Signed)
Spoke with the pt  He is wanting 90 day supply rx to be sent to [facility] for Ofev  I have sent rx  Nothing further needed

## 2020-02-18 ENCOUNTER — Telehealth: Payer: Self-pay | Admitting: Internal Medicine

## 2020-02-18 NOTE — Telephone Encounter (Signed)
Beatriz Chancellor, CPhT     1:57 PM Note  Received a fax from  [facility] regarding an Approval for [place] patient assistance from 02/02/20 to 04/28/20.   Phone number: [phone]

## 2020-02-18 NOTE — Telephone Encounter (Signed)
Called and spoke with Hassan Rowan at [facility] who states the medication was denied because the patient does not have Medicare part D. She also states that the patient called this morning and she spoke with him regarding getting a refill of OFEV. She states they called [employer] together and [employer] took over the call the get further information from patient.  Called [employer] at [phone] and spoke with [place] who states that they did talk to the patient and got everything squared away but that the patient is requesting extra medication and they need another prescription for 30 days due to the patient going out of the country. Spoke with pharmacist Claiborne Billings and gave verbal order. Nothing further needed at this time.  Called and spoke with patient to let him know that I called [employer] and gave them verbal order for extra 30 days since he is going out of the country. Provided him with the number [phone] to call and check on it. He expressed understanding. Nothing further needed at this time.      Beatriz Chancellor, CPhT     1:57 PM Note  Received a fax from [facility] regarding an Approval for [place] patient assistance from 02/02/20 to 04/28/20.   Phone number: [phone]

## 2020-02-23 ENCOUNTER — Telehealth: Payer: Self-pay | Admitting: Internal Medicine

## 2020-02-23 NOTE — Telephone Encounter (Signed)
Spoke with pt who stated that pharmacy sent him 60 pills of Ofev when 90 were ordered. Called [employer] pharmacist who stated pt recivied 60 day supply around 02/14/20 and another 30 day supply on 02/23/20. Pt stated he started taking the 90 day supply on 02/14/20 and now only has about 60 days worth to go on 105 day trip to [place] with. Pt will need. Pt is stating he will need 55 another 55 day supply to last whole trip. Dr. Chase Caller please advise. Thank you

## 2020-02-23 NOTE — Telephone Encounter (Signed)
We do not have samples of ofev (eother from the manufacturer or donor samples].  He has to sort this out with [employer].  I know a similar patient who went back to [place] and who could not get extended supply.  They had to get the supply locally in [place] with a local physician prescription.  You can ask Rachaele in our pharmacy technician office to see what she can do  I am copying her

## 2020-02-25 NOTE — Telephone Encounter (Signed)
Called and spoke to [facility], they are unable to send out another shipment until patient is due for another refill. Patient should try to coordinate refill with [facility] when he gets to his last month of medication on hand.  Called and discussed with patient, he will try to coordinate around the end of November/ Beginning of December. He plans to have medication sent to his home and a family member will send it to him in [place]. Advised him to reach out to office if he has any issues.  [employer] Phone# [phone]  Thanks!  Jeff Willis

## 2020-02-25 NOTE — Telephone Encounter (Signed)
Thanks. Will close message Rachael

## 2020-03-01 ENCOUNTER — Encounter: Payer: Self-pay | Admitting: Internal Medicine

## 2020-03-01 ENCOUNTER — Ambulatory Visit: Payer: Medicare Other | Admitting: Internal Medicine

## 2020-03-01 ENCOUNTER — Other Ambulatory Visit: Payer: Self-pay

## 2020-03-01 VITALS — BP 122/72 | HR 110 | Temp 97.6°F | Ht 66.0 in | Wt 131.4 lb

## 2020-03-01 DIAGNOSIS — J84112 Idiopathic pulmonary fibrosis: Secondary | ICD-10-CM

## 2020-03-01 DIAGNOSIS — R634 Abnormal weight loss: Secondary | ICD-10-CM | POA: Diagnosis not present

## 2020-03-01 DIAGNOSIS — J849 Interstitial pulmonary disease, unspecified: Secondary | ICD-10-CM | POA: Diagnosis not present

## 2020-03-01 DIAGNOSIS — Z7184 Encounter for health counseling related to travel: Secondary | ICD-10-CM

## 2020-03-01 DIAGNOSIS — R053 Chronic cough: Secondary | ICD-10-CM

## 2020-03-01 LAB — HEPATIC FUNCTION PANEL
ALT: 12 U/L (ref 0–53)
AST: 18 U/L (ref 0–37)
Albumin: 3.9 g/dL (ref 3.5–5.2)
Alkaline Phosphatase: 56 U/L (ref 39–117)
Bilirubin, Direct: 0.1 mg/dL (ref 0.0–0.3)
Total Bilirubin: 0.6 mg/dL (ref 0.2–1.2)
Total Protein: 7.2 g/dL (ref 6.0–8.3)

## 2020-03-01 MED ORDER — NINTEDANIB ESYLATE 100 MG PO CAPS: 100.0000 mg | ORAL_CAPSULE | Freq: Two times a day (BID) | ORAL | 3 refills | Status: DC

## 2020-03-01 NOTE — Patient Instructions (Addendum)
ICD-10-CM   1. IPF (idiopathic pulmonary fibrosis) ([place])  J84.112   2. Interstitial lung disease ([facility])  J84.9 Hepatic function panel    Pulmonary function test    Hepatic function panel    Nintedanib (OFEV) 100 MG CAPS  3. Chronic cough  R05.3   4. Weight loss, non-intentional  R63.4   5. Travel advice encounter  Z71.84      IPF  Therapeutic drug monitoring  - stable since last visit - glad you are tolerating ofev 100-mg twice daily - approved 02/02/20. Now taking x 2 weeks and stable on that   Plan -check liver function test today,  - spirometry and dlco in feb 2022 upon return. = interim monitoring in [place] with Dr Posey Pronto in [place], [place] - continue ofev 100mg  twice daily and monitor for Gi side effects  - use friend to get you ofev shipment to [place]   Cough:  -Due to pulmonary fibrosis and acid reflux  Plan  -Continue acid reflux control - can increase ppi and h2 blockade dosage for palliative relief -We will address more and consider chronic prednisone or a reesearch trial at followup in feb 2021  Weight loss: -You are having weight loss.  This can be because of pulmonary fibrosis because CT abdoment, and recent cystoscopy is normal. Weight loss was even before ofev and now ongoing since ofev   Plan  -= gave [name] article on weight loss and IPF  - will reach out to other experts on role of IPF in weight loss - emailed Dr Dewaine Conger at [employer] advice encounter  -Noted travel to [place] on March 05, 2020 through February 2022  Plan -   I have texted Dr Lorie Phenix in [place] already; he can be in touch with me  - move legs/drink water in flight - avoid food borne illness and mosquit[place] bites - maks and maintain social distance and avoid respiratory exacerbation   Follow-up -In feb 2022 to see how the nintedanib start has gone on  -Liver function test, ILD symptom questionnaire and simple walk test at follow-up  - 30 min slot upon return

## 2020-03-01 NOTE — Progress Notes (Signed)
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   Prior cough in  2019 for several months  resolved on on some kind of powder inhaler x one week = Advair     History of Present Illness  08/04/2019  Pulmonary/ 1st office eval/[name]  Chief Complaint  Patient presents with  . Pulmonary Consult    Referred by Dr Jeff Willis. Pt c/o non prod cough and throat clearing x 6 months.   Dyspnea:  None/ no aerobics, walking neighborhoods ok  Cough: always dry/ any candy helps if keeps in mouth but esp hard candy / does not use cough drops  Sleep: rotates on side, bed is flat/ one pillow, never wakes up cough but /win 15 min of stirring recurs daily  SABA use: never, also never prednisone Always trouble swallowing pills x lifetime [name] Reflux v Neurogenic Cough Differentiator Reflux Comments  Do you awaken from a sound sleep coughing violently?                            With trouble breathing? none   Do you have choking episodes when you cannot  Get enough air, gasping for air ?              none   Do you usually cough when you lie down into  The bed, or when you just lie down to rest ?                          No    Do you usually cough after meals or eating?         Yes after bfast   Do you cough when (or after) you bend over?    no   GERD SCORE     [name] Reflux v Neurogenic Cough Differentiator Neurogenic   Do you more-or-less cough all day long? sporadic   Does change of  temperature make you cough? None    Does laughing or chuckling cause you to cough? yes   Do fumes (perfume, automobile fumes, burned  Toast, etc.,) cause you to cough ?      Yes    Does speaking, singing, or talking on the phone cause you to cough   ?               Yes    Neurogenic/Airway score     rec Call Dr Jeff Willis office and ask what inhaler you received in 2019 as sample that seemed to help your cough = advair dpi Stop cetrizine, flonase  Prednisone 10 mg take  4 each am x 2 days,   2 each am x 2 days,  1 each am x 2 days and stop  Pantoprazole (protonix) 40 mg   Take  30-60 min before first meal of  the day and Pepcid (famotidine)  20 mg one after supper  until return to office  GERD   Gabapentin 100 mg four times daily (ok to stop when no coughing at all for a week)  Please schedule a follow up office visit in 4 weeks, sooner if needed  with all medications /inhalers/ solutions in hand so we can verify exactly what you are taking. This includes all medications from all doctors and over the counters    09/06/2019  f/u ov/[name] re: uacs on gabapenitn 100 mg three times  wolicki had tried gabapentin but failed to reveal that at last ov when I recommended it  Chief Complaint  Patient presents with  . Follow-up    4 wk f/u. Wants to discuss switching Advair.   Dyspnea:  No  Cough: better just while on prednisone maybe 50-80%   Sleeping: no noct cough SABA use: none 02: none  rec Gabapentin 200 mg up to 4 x daily  Pantoprazole (protonix) 40 mg   Take  30-60 min before first meal of the day and Pepcid (famotidine)  20 mg one @  bedtime until return to office - this is the best way to tell whether stomach acid is contributing to your problem.   Keep the candy handy  Prednisone 10 mg  Take 4 for three days 3 for three days 2 for three days 1 for three days and stop  If not better in 2 weeks see Dr Jeff Willis for your reflux  Please remember to go to the lab department and xray dept  for your  tests - we will call you with the results when they are available.         OV 10/05/2019  Subjective:  [name], male , DOB: [birth year] , age 79 y.o. , MRN: [id number] , ADDRESS: [place] [address]   10/05/2019 -   Chief Complaint  Patient presents with  . Follow-up    Pt states he has been doing okay since last visit and denies any complaints.   -79 year old Jeff Willis male.  Referred for interstitial lung disease.  He is accompanied by his son Jeff Willis who is a Software engineer.  I know Jeff Willis  from a few years ago when heused to accompany his aunt; asthma patient of mine. Jeff Willis  has has been referred by Dr. Christinia Willis to the ILD center.  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  Up until the pandemic used to travel quite frequently.  He is to go to [place] for 3 months.  At none of the properties he is being exposed to mold.  He developed insidious onset of chronic cough over a year ago this has been persistent.  In the last few months several cough related treatments have been tried with some improvement.  Has been on gabapentin but this made him sleepy and is currently stopped it.  Because of chronic cough which he only attributes his constant clearing of the throat he had a high-resolution CT scan of the chest that I personally visualized and shows probable UIP pattern and therefore he has been referred here.  In my personal visualization it might even be definite of UIP because there might be some early honeycombing there.  He denies any shortness of breath.  At rest he was found to be tachycardic but he says this can at baseline for him.  He will follow this up with his primary care physician.  [place] Integrated Comprehensive( ILD Questionnaire  Symptoms:   He denies any shortness of breath.  No episodic shortness of breath.  However on the questioning he does report mild shortness of breath climbing up stairs.  In  terms of his cough it started in June 2020.  It is the same since it started maybe in the last week it is slightly better.  Is moderate in intensity.  He does clear the throat.  Mostly dry cough occasionally white sputum.  Does feel a tickle in the back of his throat.  He is not waking up in the middle of the night.  Occasionally affects his voice.  No nausea no vomiting no diarrhea no wheezing.  SYMPTOM SCALE - ILD 10/05/2019   O2 use ra  Shortness of Breath 0 -> 5 scale with 5 being worst (score 6 If unable to do)  At rest 0  Simple tasks - showers, clothes change, eating, shaving 0  Household (dishes, doing bed, laundry) 0  Shopping 0  Walking level at own pace 0  Walking up Stairs 1  Total (30-36) Dyspnea Score 1  How bad is your cough? 2  How bad is your fatigue 0  How bad is nausea 0  How bad is vomiting?  00  How bad is diarrhea? 0  How bad is anxiety? 00  How bad is depression 0      Past Medical History : Denies any asthma COPD or heart failure rheumatoid arthritis or scleroderma or lupus or polymyositis or Sjogren's.  Denies sleep apnea.  Denies HIV denies pulmonary hypertension.  Denies diabetes or thyroid disease or stroke or seizures mononucleosis.  Denies any hepatitis or tuberculosis or kidney disease or pneumonia or blood clots or heart disease or pleurisy.  Does have on and off acid reflux for the last year   ROS: Positive for dry mouth for the last several months.  He also has some dysphagia for swallowing pills unclear for what duration.  He also has acid reflux on PPI and H2 blocker.  Denies any Raynaud's or recurrent fever or weight loss.  No nausea no vomiting.   FAMILY HISTORY of LUNG DISEASE: * -Denies family history of COPD or asthma sarcoid or cystic fibrosis of hypersensitive pneumonitis or autoimmune disease.   EXPOSURE HISTORY: Denies any Covid.  Denies smoking cigarettes.  Denies marijuana denies smoking.  Denies IV drug use denies cocaine   HOME and  HOBBY DETAILS : Single-family home [place] for the last 10 years.  Age of the home is 15 years.  No dampness.  No mildew.  No humidifier use.  No CPAP use no nebulizer machine use.  He does use a steam iron but there is no mold or mildew in it.  There is no [place] inside the house no pet birds or parakeets.  No pet gerbils no feather pillows no mold in the [facility] duct no music habits.  He does do some gardening very small garden in the house limited gardening mostly just water the garden.  No bird feather exposure no flood damage no strong mats no hot tub or Jacuzzi.  No exposure to animals at work.   OCCUPATIONAL HISTORY (122 questions) :   -Organic antigen exposure is negative inorganic exposure is negative   PULMONARY TOXICITY HISTORY (27 items):  -18-day prednisone course that ended last week.  He did have seen implantation for prostate cancer in October 2019 these were radiation seeds.      Results  for TAMARI, BUSIC (MRN [id number]) as of 11/15/2019 16:27  Ref. Range 09/07/2019 11:29 10/05/2019 11:25  Anti Nuclear Antibody (ANA) Latest Ref Range: NEGATIVE   POSITIVE (A)  ANA Pattern 1 Unknown  Nuclear, Speckled (A)  ANA Titer 1 Latest Units: titer  1:40 (H)  Angiotensin-Converting Enzyme Latest Ref Range: 9 - 67 U/L  34  Cyclic Citrullin Peptide Ab Latest Units: UNITS  <16  ds DNA Ab Latest Units: IU/mL  <1  Myeloperoxidase Abs Latest Units: AI  <1.0  Serine Protease 3 Latest Units: AI  <1.0  RA Latex Turbid. Latest Ref Range: <14 IU/mL  <14  IgE (Immunoglobulin E), Serum Latest Ref Range: <OR=114 kU/L 36   SSA (Ro) (ENA) Antibody, IgG Latest Ref Range: <1.0 NEG AI  <1.0 NEG  SSB (La) (ENA) Antibody, IgG Latest Ref Range: <1.0 NEG AI  <1.0 NEG  Scleroderma (Scl-70) (ENA) Antibody, IgG Latest Ref Range: <1.0 NEG AI  <1.0 NEG     Simple office walk 185 feet x  3 laps goal with forehead probe 10/05/2019   O2 used ra  Number laps completed 3  Comments about pace avt   Resting Pulse Ox/HR 100% and 122/min  Final Pulse Ox/HR 100% and 143/min  Desaturated </= 88% x  Desaturated <= 3% points x  Got Tachycardic >/= 90/min x  Symptoms at end of test x  Miscellaneous comments x    Results for CHAPMAN, MATTEUCCI (MRN [id number]) as of 10/05/2019 10:40  Ref. Range 09/07/2019 11:29  Eosinophils Absolute Latest Ref Range: 0.0 - 0.7 K/uL 0.1     High-resolution CT chest Sep 15, 2019: Personally visualized and agree with the findings below IMPRESSION: 1. Spectrum of findings compatible with basilar predominant fibrotic interstitial lung disease with suggestion of early honeycombing. 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 [place], Iss 5, [phone], Dec 28 2016. 2. Ectatic 4.4 cm ascending thoracic aorta. Recommend annual imaging followup by CTA or MRA. This recommendation follows 2010 ACCF/AHA/AATS/ACR/ASA/SCA/SCAI/SIR/STS/SVM Guidelines for the Diagnosis and Management of Patients with Thoracic Aortic Disease. Circulation. 2010; 121: S010-X323. Aortic aneurysm NOS (ICD10-I71.9). 3. One vessel coronary atherosclerosis. 4. Aortic Atherosclerosis (ICD10-I70.0).   Electronically Signed   By: Ilona Sorrel M.D.   On: 09/15/2019 14:26    Results for ANSH, FAUBLE (MRN [id number]) as of 10/05/2019 10:40  Ref. Range 09/07/2019 11:29  Sheep Sorrel IgE Latest Units: kU/L <0.10  Pecan/Hickory Tree IgE Latest Units: kU/L <0.10  IgE (Immunoglobulin E), Serum Latest Ref Range: <OR=114 kU/L 36  Allergen, D pternoyssinus,d7 Latest Units: kU/L <0.10  Cat Dander Latest Units: kU/L <0.10  Dog Dander Latest Units: kU/L <0.10  [place] Grass Latest Units: kU/L <0.10  Johnson Grass Latest Units: kU/L <0.10  Timothy Grass Latest Units: kU/L <0.10  Cockroach Latest Units: kU/L <0.10  Aspergillus fumigatus,  m3 Latest Units: kU/L <0.10  Allergen, Comm Silver Wendee Copp, t9 Latest Units: kU/L <0.10  Allergen, Cottonwood, t14 Latest Units: kU/L <0.10  Elm IgE Latest Units: kU/L <0.10  Allergen, Mulberry, t76 Latest Units: kU/L <0.10  Allergen, Oak,t7 Latest Units: kU/L <0.10  COMMON RAGWEED (SHORT) (W1) IGE Latest Units: kU/L 0.15 (H)  Allergen, Mouse Urine Protein, e78 Latest Units: kU/L <0.10  D. farinae Latest Units: kU/L <0.10  Allergen, Cedar tree, t12 Latest Units: kU/L <0.10  Box Elder IgE Latest Units: kU/L <0.10  Rough Pigweed  IgE  Latest Units: kU/L <0.10    ROS - per HPI   OV 11/15/2019  Subjective:  [name], male , DOB: Nov 09, 1940 , age 63 y.o. , MRN: [id number] , ADDRESS: [place] [address]   11/15/2019 -   Chief Complaint  Patient presents with  . Follow-up    Pt states the cough has been better since last visit.      HPI Golden 79 y.o. -presents with his son Jeff Willis for follow-up.  He is here to discuss test results.  In the interim he called for worsening cough.  We gave him prednisone.  He tells me the prednisone actually made the cough worse particularly towards the end.  After that the son  doubled up his PPI and also added H2 blockade and the cough went away completely.  Patient feels completely normal that he is possible that he has interstitial lung disease.  We reviewed the scan and I visualized it with him together and his son.  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  They wanted to discuss the implications of the scan in the setting of controlled acid reflux and controlled cough but he is largely asymptomatic at this point.  Noted his serologies were negative except for trace ANA and his overnight oxygen study was normal.     OV 01/17/2020   Subjective:  [name], male , DOB: 23-Aug-1940, age 53 y.o. years. , MRN: [id number],  ADDRESS: [place] [address] PCP  Gaynelle Arabian,  MD Providers : Treatment Team:  Attending Provider: Brand Males, MD   Chief Complaint  Patient presents with  . Follow-up    pt is here to go over ct and pft    Follow-up interstitial lung disease Follow-up associated cough partially controlled with acid reflux treatment Associated weight loss present   HPI Luqman G Erbes 79 y.o. -presents with his son Jeff Willis.  In the interim he is continue to lose weight.  He has lost another 3-4 pounds since June 2021.  His clothes are much looser.  He thinks he has been steadily losing weight.  His respiratory symptoms are not any worse although he says that his cough that seem to initially improve with doubling up of acid reflux therapy seems to persist.  At last visit in September 2020 when I thought the cough resolved but they tell me the cough only partially improved.  Nevertheless it is persisting and they are worried about it.  In addition he is worried about weight loss.  He also has poor appetite.  He is not in any pulmonary fibrosis antifibrotic's.  There is no diarrhea.  Symptom score suggest some element of anxiety since his last visit.  He has had pulmonary function test and it shows his FVC is actually better but his DLCO is actually worse.  He had high-resolution CT chest that I visualized and reviewed with him.  It shows probable UIP with possible emerging honeycombing.  I agree with the radiologist.  The radiologist actually feels that it might be a little bit worse compared to few months ago.  Of note: He has upcoming trip to [place] between March 05, 2020 and February 2022.  He wants to make sure his care is coordinated.  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 Evaluate for pulmonary fibrosis.  EXAM: CT CHEST WITHOUT CONTRAST  TECHNIQUE: Multidetector CT imaging of the chest was  performed following the standard protocol without intravenous contrast. High resolution imaging of the lungs, as well as inspiratory and expiratory imaging, was performed.  COMPARISON:  Chest CT 09/15/2019.  FINDINGS: Cardiovascular: Heart size is normal. There is no significant pericardial fluid, thickening or pericardial calcification. There is aortic atherosclerosis, as well as atherosclerosis of the great vessels of the mediastinum and the coronary arteries, including calcified atherosclerotic plaque in the left main, left anterior descending and right coronary arteries. Dilatation of the ascending thoracic aorta measuring 4.5 cm in diameter.  Mediastinum/Nodes: No pathologically enlarged mediastinal or hilar lymph nodes. Please note that accurate exclusion of hilar adenopathy is limited on noncontrast CT scans. Esophagus is unremarkable in appearance. No axillary lymphadenopathy.  Lungs/Pleura: High-resolution images demonstrate widespread areas of ground-glass attenuation, septal thickening, thickening of the peribronchovascular interstitium, mild cylindrical bronchiectasis and peripheral bronchiolectasis. There is a suggestion of, but not definitive evidence of, early honeycombing in portions of the lungs. These findings do have a definitive craniocaudal gradient and appear slightly progressive compared to the prior study. Inspiratory and expiratory imaging is unremarkable. No acute consolidative airspace disease. No pleural effusions. No definite suspicious appearing pulmonary nodules or masses are noted.  Upper Abdomen: Cortical calcification in the upper pole of the right kidney incidentally noted.  Musculoskeletal: There are no aggressive appearing lytic or blastic lesions noted in the visualized portions of the skeleton.  IMPRESSION: 1. The appearance of the lungs is compatible with progressive interstitial lung disease categorized as probable usual  interstitial pneumonia (UIP) per current ATS guidelines. 2. Aortic atherosclerosis, in addition to left main and 2 vessel coronary artery disease. In addition, there is mild aneurysmal dilatation of the ascending thoracic aorta (4.5 cm in diameter). Ascending thoracic aortic aneurysm. Recommend semi-annual imaging followup by CTA or MRA and referral to cardiothoracic surgery if not already obtained. This recommendation follows 2010 ACCF/AHA/AATS/ACR/ASA/SCA/SCAI/SIR/STS/SVM Guidelines for the Diagnosis and Management of Patients With Thoracic Aortic Disease. Circulation. 2010; 121: S811-S315. Aortic aneurysm NOS (ICD10-I71.9).  Aortic Atherosclerosis (ICD10-I70.0). Aortic aneurysm NOS (ICD10-I71.9).   Electronically Signed   By: Vinnie Langton M.D.   On: 01/13/2020 15:15   ROS - per HPI    OV 03/01/2020  Subjective:  [name], male , DOB: 1940-05-02 , age 26 y.o. , MRN: [id number] , ADDRESS: [place] [address] PCP Gaynelle Arabian, MD Patient Care Team: Gaynelle Arabian, MD as PCP - General (Family Medicine)  This Provider for this visit: Treatment Team:  Attending Provider: Brand Males, MD  FU IPF Associated weight loss + pre-ofev   03/01/2020 -   Chief Complaint  Patient presents with  . Follow-up    ILD, doing ok, cough has not improved     HPI Jerrard G Romas 79 y.o. -returns for follow-up.  This is after starting nintedanib.  He is currently on 100 mg twice daily for 2 weeks.  His prescription is approved on February 02, 2020.  He accidentally got 150 mg twice daily but he sent this back.  He is going to send [place] back.  He has upcoming travel to [place] later this week he will be gone there for till February 2022.  He has supply and refill of his nintedanib on 06 March 2020.  After that he does not have nintedanib.  He and his son report that somebody will be able to go to [place] and deliver it  to him.  They  do not want to trust the courier service to take it there the supply chain issues.  At this point time from a respiratory standpoint he has minimal dyspnea.  He still has a cough.  The cough is not worse but after reducing the PPI dose the cough has come back.  It is somewhat troublesome.  The severity score is listed below.  They report the GI Dr. Collene Willis has reported concern about taking very high-dose of PPI and H2 blockade which actually helped the cough.  Is because of osteoporosis risk.  I agreed with that concern.  However patient is also reporting significant symptom burden.  Primary care has suggested that they follow the symptom control and quality of life issues.  I did support the primary care viewpoint as well.  Did explain to them at the end of the day as a risk-benefit ratio and with an advanced disease like pulmonary fibrosis 1 might have to consider taking a short-term again with cough control at the expense of long-term risk of side effects.  They are going to think about this.  In terms of his travel to [place].  He put me in touch with Dr. Posey Pronto.  I texted him and establish contact.  He will follow with Dr. Posey Pronto who is a pulmonologist trained in [place] but now living in [place].  Gave other travel advice are listed below.  In terms of weight loss: This is ongoing.  Even after starting the Ofev is not having any GI side effects but his weight loss continues.  He had a CT abdomen that is not showing any reason for weight loss.  Apparently had some hematuria had cystoscopy with Dr. Junious Silk.  He has some radiation seed implants.  There is no bladder cancer.  The really frustrated by this.  I did pull out an article about IPF and weight loss for the son to read.- shows IPF weight loss is asssocited with worse progrnosis   Wt Readings from Last 3 Encounters:  03/01/20 131 lb 6.4 oz (59.6 kg)  01/17/20 134 lb 12.8 oz (61.1 kg)  11/15/19 138 lb (62.6 kg)     SYMPTOM SCALE - ILD 10/05/2019 137 pounds  01/17/2020 134 pounds 03/01/2020 131#  O2 use ra ra ra  Shortness of Breath 0 -> 5 scale with 5 being worst (score 6 If unable to do)    At rest 0 0 0  Simple tasks - showers, clothes change, eating, shaving 0 0 0  Household (dishes, doing bed, laundry) 0 1 1  Shopping 0 0 0  Walking level at own pace 0 0 0  Walking up Stairs 1 0 1  Total (30-36) Dyspnea Score 1 1 2   How bad is your cough? 2 3 0  How bad is your fatigue 0 0 0  How bad is nausea 0 0 0  How bad is vomiting?  00 0 0  How bad is diarrhea? 0 0 0  How bad is anxiety? 00 1 1  How bad is depression 0 1 1       Simple office walk 185 feet x  3 laps goal with forehead probe 10/05/2019  03/01/2020   O2 used ra ra  Number laps completed 3   Comments about pace avt   Resting Pulse Ox/HR 100% and 122/min 98% and 110  Final Pulse Ox/HR 100% and 143/min 97% and 130  Desaturated </= 88% x x  Desaturated <= 3% points x  x  Got Tachycardic >/= 90/min x x  Symptoms at end of test x x  Miscellaneous comments x x   PFT Results Latest Ref Rng & Units 01/17/2020 10/06/2019  FVC-Pre L - 2.59  FVC-Predicted Pre % 83 78  Pre FEV1/FVC % % 86 86  FEV1-Pre L 2.37 2.24  FEV1-Predicted Pre % 95 89  DLCO uncorrected ml/min/mmHg 13.79 15.70  DLCO UNC% % 48 55  DLCO corrected ml/min/mmHg 13.79 15.70  DLCO COR %Predicted % 48 55  DLVA Predicted % 81 97      ROS - per HPI     has a past medical history of Back pain, Elevated PSA, Essential tremor, Hypercholesteremia, Neuropathy, Prostate cancer ([place]), and Sinus tachycardia (01/30/2018).   reports that he has never smoked. He has never used smokeless tobacco.  Past Surgical History:  Procedure Laterality Date  . LUMBAR MICRODISCECTOMY     L4-5  . RADIOACTIVE SEED IMPLANT N/A 02/24/2018   Procedure: RADIOACTIVE SEED IMPLANT/BRACHYTHERAPY IMPLANT;  Surgeon: Festus Aloe, MD;  Location[facility];  Service: Urology;  Laterality: N/A;  . SPACE OAR INSTILLATION  N/A 02/24/2018   Procedure: SPACE OAR INSTILLATION;  Surgeon: Festus Aloe, MD;  [facility];  Service: Urology;  Laterality: N/A;    No Known Allergies  Immunization History  Administered Date(s) Administered  . Influenza, High Dose Seasonal PF 01/28/2019  . Influenza-Unspecified 01/05/2020  . PFIZER SARS-COV-2 Vaccination 07/23/2019, 08/17/2019  . Pneumococcal-Unspecified 02/28/2015    Family History  Problem Relation Age of Onset  . Other Mother        died at 103 of natural causes  . Other Father        unsure of health  . Cancer Neg Hx      Current Outpatient Medications:  .  Cholecalciferol (VITAMIN D3 PO), Take 1 tablet by mouth every 30 (thirty) days., Disp: , Rfl:  .  famotidine (PEPCID) 20 MG tablet, One after supper (Patient taking differently: 40 mg. One after supper), Disp: 30 tablet, Rfl: 11 .  Nintedanib (OFEV) 100 MG CAPS, Take 1 capsule (100 mg total) by mouth 2 (two) times daily., Disp: 180 capsule, Rfl: 3 .  pantoprazole (PROTONIX) 40 MG tablet, TAKE ONE TABLET BY MOUTH DAILY TAKE 30 TO 60 MINUTES PRIOR TO FIRST MEAL OF THE DAY, Disp: 30 tablet, Rfl: 3 .  vitamin B-12 (CYANOCOBALAMIN) 1000 MCG tablet, Take 1,000 mcg by mouth every Monday, Wednesday, and Friday. , Disp: , Rfl:  .  gabapentin (NEURONTIN) 100 MG capsule, 2 pills four time a day (Patient not taking: Reported on 03/01/2020), Disp: 240 capsule, Rfl: 2      Objective:   Vitals:   03/01/20 0931  BP: 122/72  Pulse: (!) 110  Temp: 97.6 F (36.4 C)  TempSrc: Oral  SpO2: 100%  Weight: 131 lb 6.4 oz (59.6 kg)  Height: 5\' 6"  (1.676 m)    Estimated body mass index is 21.21 kg/m as calculated from the following:   Height as of this encounter: 5\' 6"  (1.676 m).   Weight as of this encounter: 131 lb 6.4 oz (59.6 kg).  @WEIGHTCHANGE @  Filed Weights   03/01/20 0931  Weight: 131 lb 6.4 oz (59.6 kg)     Physical Exam General: No distress. Looks well Neuro: Alert  and Oriented x 3. GCS 15. Speech normal Psych: Pleasant Resp:  Barrel Chest - no.  Wheeze - no, Crackles - yes at base, No overt respiratory distress CVS:  Normal heart sounds. Murmurs - no Ext: Stigmata of Connective Tissue Disease - no HEENT: Normal upper airway. PEERL +. No post nasal drip        Assessment:       ICD-10-CM   1. IPF (idiopathic pulmonary fibrosis) ([facility])  J84.112   2. Interstitial lung disease (HCC)  J84.9 Hepatic function panel    Pulmonary function test    Hepatic function panel    Nintedanib (OFEV) 100 MG CAPS  3. Chronic cough  R05.3   4. Weight loss, non-intentional  R63.4   5. Travel advice encounter  Z71.84        Plan:     Patient Instructions     ICD-10-CM   1. IPF (idiopathic pulmonary fibrosis) ([facility])  J84.112   2. Interstitial lung disease (HCC)  J84.9 Hepatic function panel    Pulmonary function test    Hepatic function panel    Nintedanib (OFEV) 100 MG CAPS  3. Chronic cough  R05.3   4. Weight loss, non-intentional  R63.4   5. Travel advice encounter  Z71.84      IPF  Therapeutic drug monitoring  - stable since last visit - glad you are tolerating ofev 100-mg twice daily - approved 02/02/20. Now taking x 2 weeks and stable on that   Plan -check liver function test today,  - spirometry and dlco in feb 2022 upon return. = interim monitoring in [place] with Dr Posey Pronto in [place], [place] - continue ofev 100mg  twice daily and monitor for Gi side effects  - use friend to get you ofev shipment to [place]   Cough:  -Due to pulmonary fibrosis and acid reflux  Plan  -Continue acid reflux control - can increase ppi and h2 blockade dosage for palliative relief -We will address more and consider chronic prednisone or a reesearch trial at followup in feb 2021  Weight loss: -You are having weight loss.  This can be because of pulmonary fibrosis because CT abdoment, and recent cystoscopy is normal. Weight loss was even before ofev and now ongoing  since ofev   Plan  -= gave Jeff Willis article on weight loss and IPF  - will reach out to other experts on role of IPF in weight loss - emailed Dr Dewaine Conger at [employer] advice encounter  -Noted travel to [place] on March 05, 2020 through February 2022  Plan -   I have texted Dr Lorie Phenix in [place] already; he can be in touch with me  - move legs/drink water in flight - avoid food borne illness and mosquitoe bites - maks and maintain social distance and avoid respiratory exacerbation   Follow-up -In feb 2022 to see how the nintedanib start has gone on  -Liver function test, ILD symptom questionnaire and simple walk test at follow-up  - 30 min slot upon return   ( Level 05 visit: Estb 40-54 min   in  visit type: on-site physical face to visit  in total care time and counseling or/and coordination of care by this undersigned MD - Dr Brand Males. This includes one or more of the following on this same day 03/01/2020: pre-charting, chart review, note writing, documentation discussion of test results, diagnostic or treatment recommendations, prognosis, risks and benefits of management options, instructions, education, compliance or risk-factor reduction. It excludes time spent by the [facility] or office staff in the care of the patient. Actual time 27 min)   SIGNATURE    Dr. Brand Males, M.D., F.C.C.P,  Pulmonary  and Critical Care Medicine Staff Physician, [facility] Director - Interstitial Lung Disease  Program  [facility] [place] [place], [place], [address]  Pager: [phone], If no answer or between  15:00h - 7:00h: call [phone] Telephone: [phone]  4:41 PM 03/01/2020

## 2020-03-02 ENCOUNTER — Telehealth: Payer: Self-pay | Admitting: Internal Medicine

## 2020-03-02 MED ORDER — CEPHALEXIN 500 MG PO CAPS: 500.0000 mg | ORAL_CAPSULE | Freq: Three times a day (TID) | ORAL | 0 refills | Status: DC

## 2020-03-02 MED ORDER — PREDNISONE 10 MG PO TABS: ORAL_TABLET | ORAL | 0 refills | Status: AC

## 2020-03-02 NOTE — Telephone Encounter (Signed)
Called and spoke with pt who stated he has a dry cough and is having to clear throat a lot.  Asked pt if he had the cough when he was at office yesterday 11/3 and he said that he did.  States that he is wanting to have something prescribed as he is about to head out of the country Sunday 11[place]7 and wants to be able to have something to take with him in case if symptoms become worse.  MR, please advise.

## 2020-03-02 NOTE — Telephone Encounter (Signed)
Called and spoke with pt letting him know the info stated by MR and he verbalized understanding. Verified preferred pharmacy and sent both pred taper and cephalexin abx to pharmacy for pt. Nothing further needed.

## 2020-03-02 NOTE — Telephone Encounter (Signed)
The plan we agree on with his son was to double up his PPI. IF in crisis in a flight he could Take prednisone 40 mg daily x 2 days, then 20mg  daily x 2 days, then 10mg  daily x 2 days, then 5mg  daily x 2 days and stop - which he can take in case he picks up broncchtis and can help cough   He can also do cephalexin 500mg  three times daily x  5 days  He can get some OTC cough medication of choice  Once he reaches [place] he should get in touch with Dr Posey Pronto  No Known Allergies

## 2020-07-17 IMAGING — CR DG CHEST 2V
2 series · 2 of 2 positions shown · non-contrast
Comparison: None.

CLINICAL DATA: Prostate cancer.  Cough.

EXAM:
CHEST - 2 VIEW

[w chest pa]
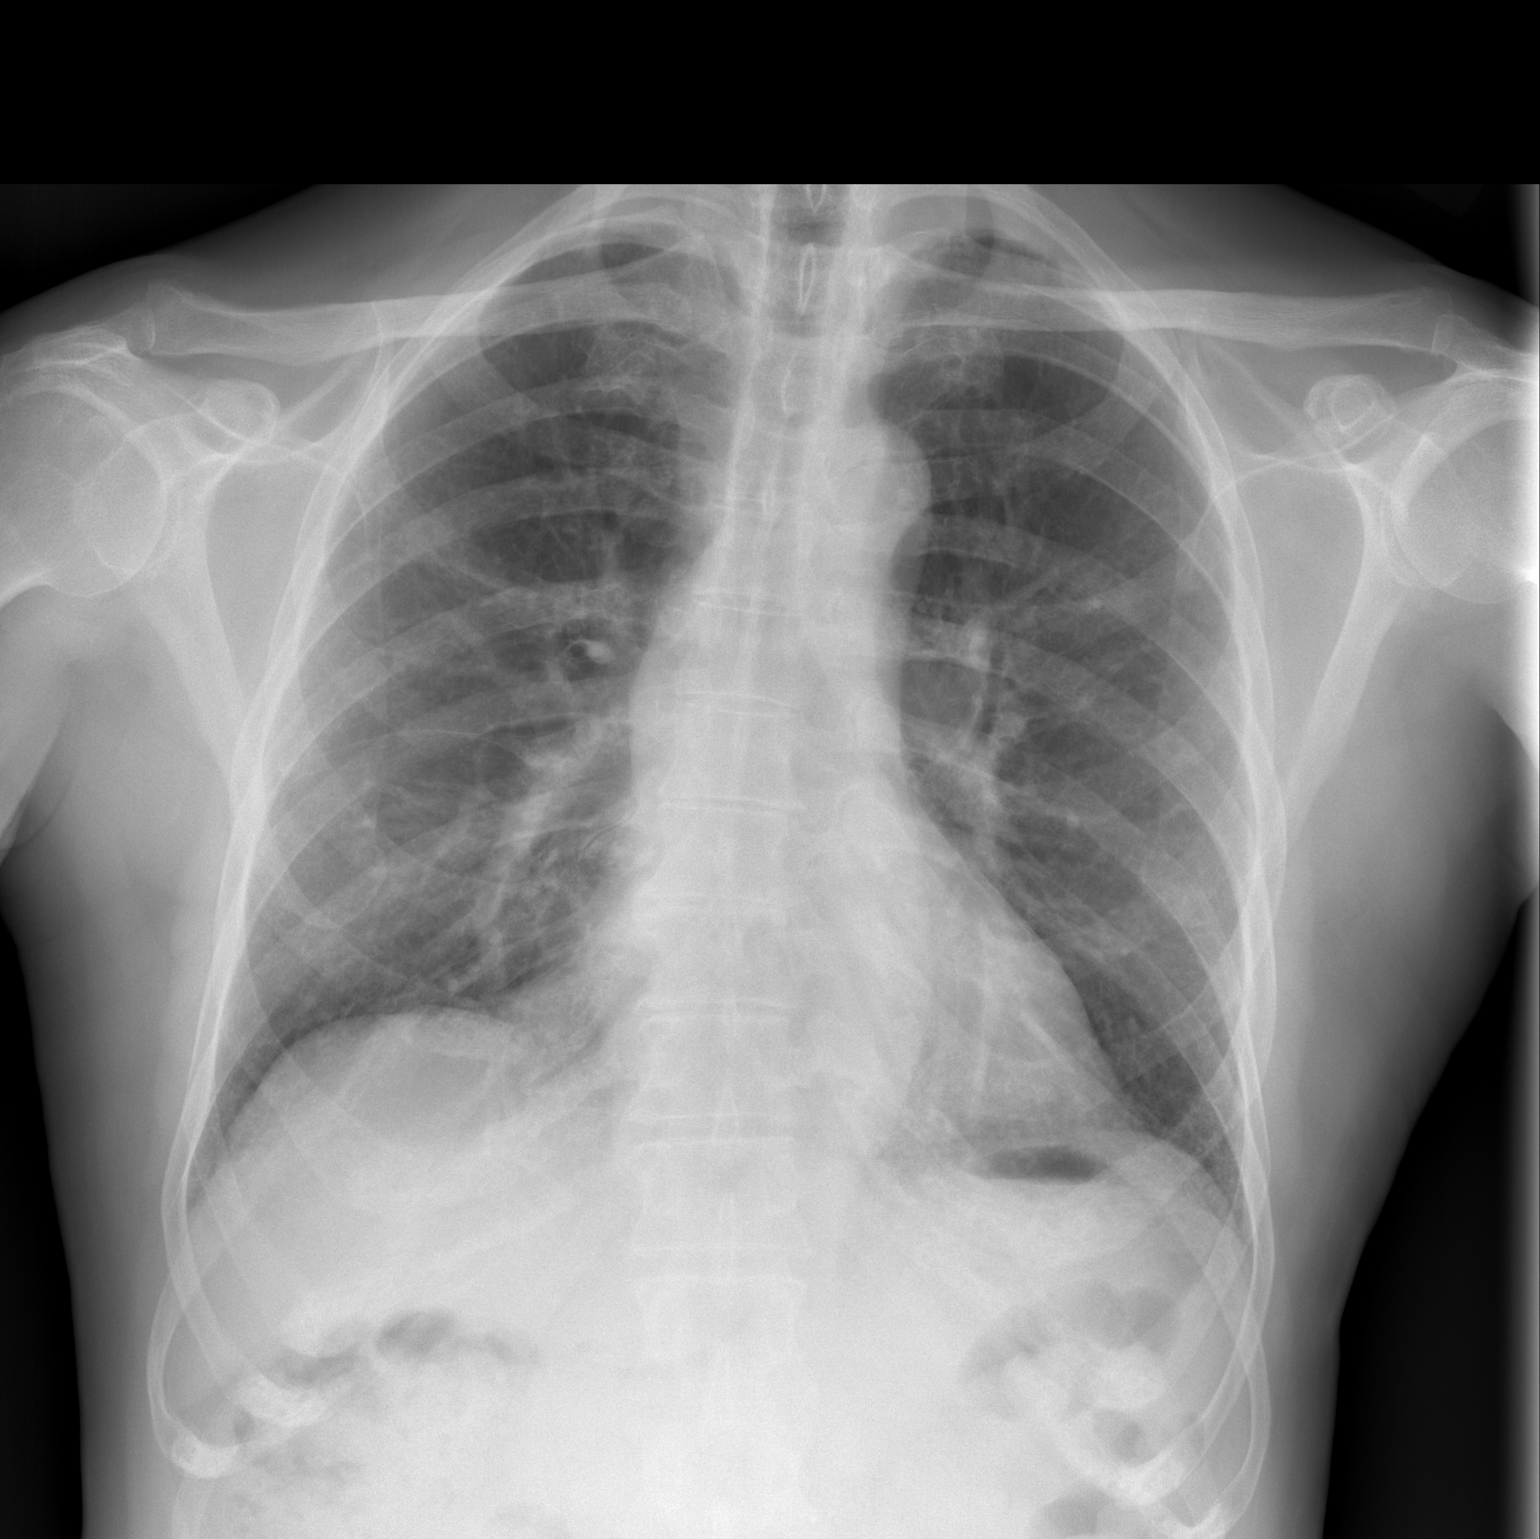

[w chest lat]
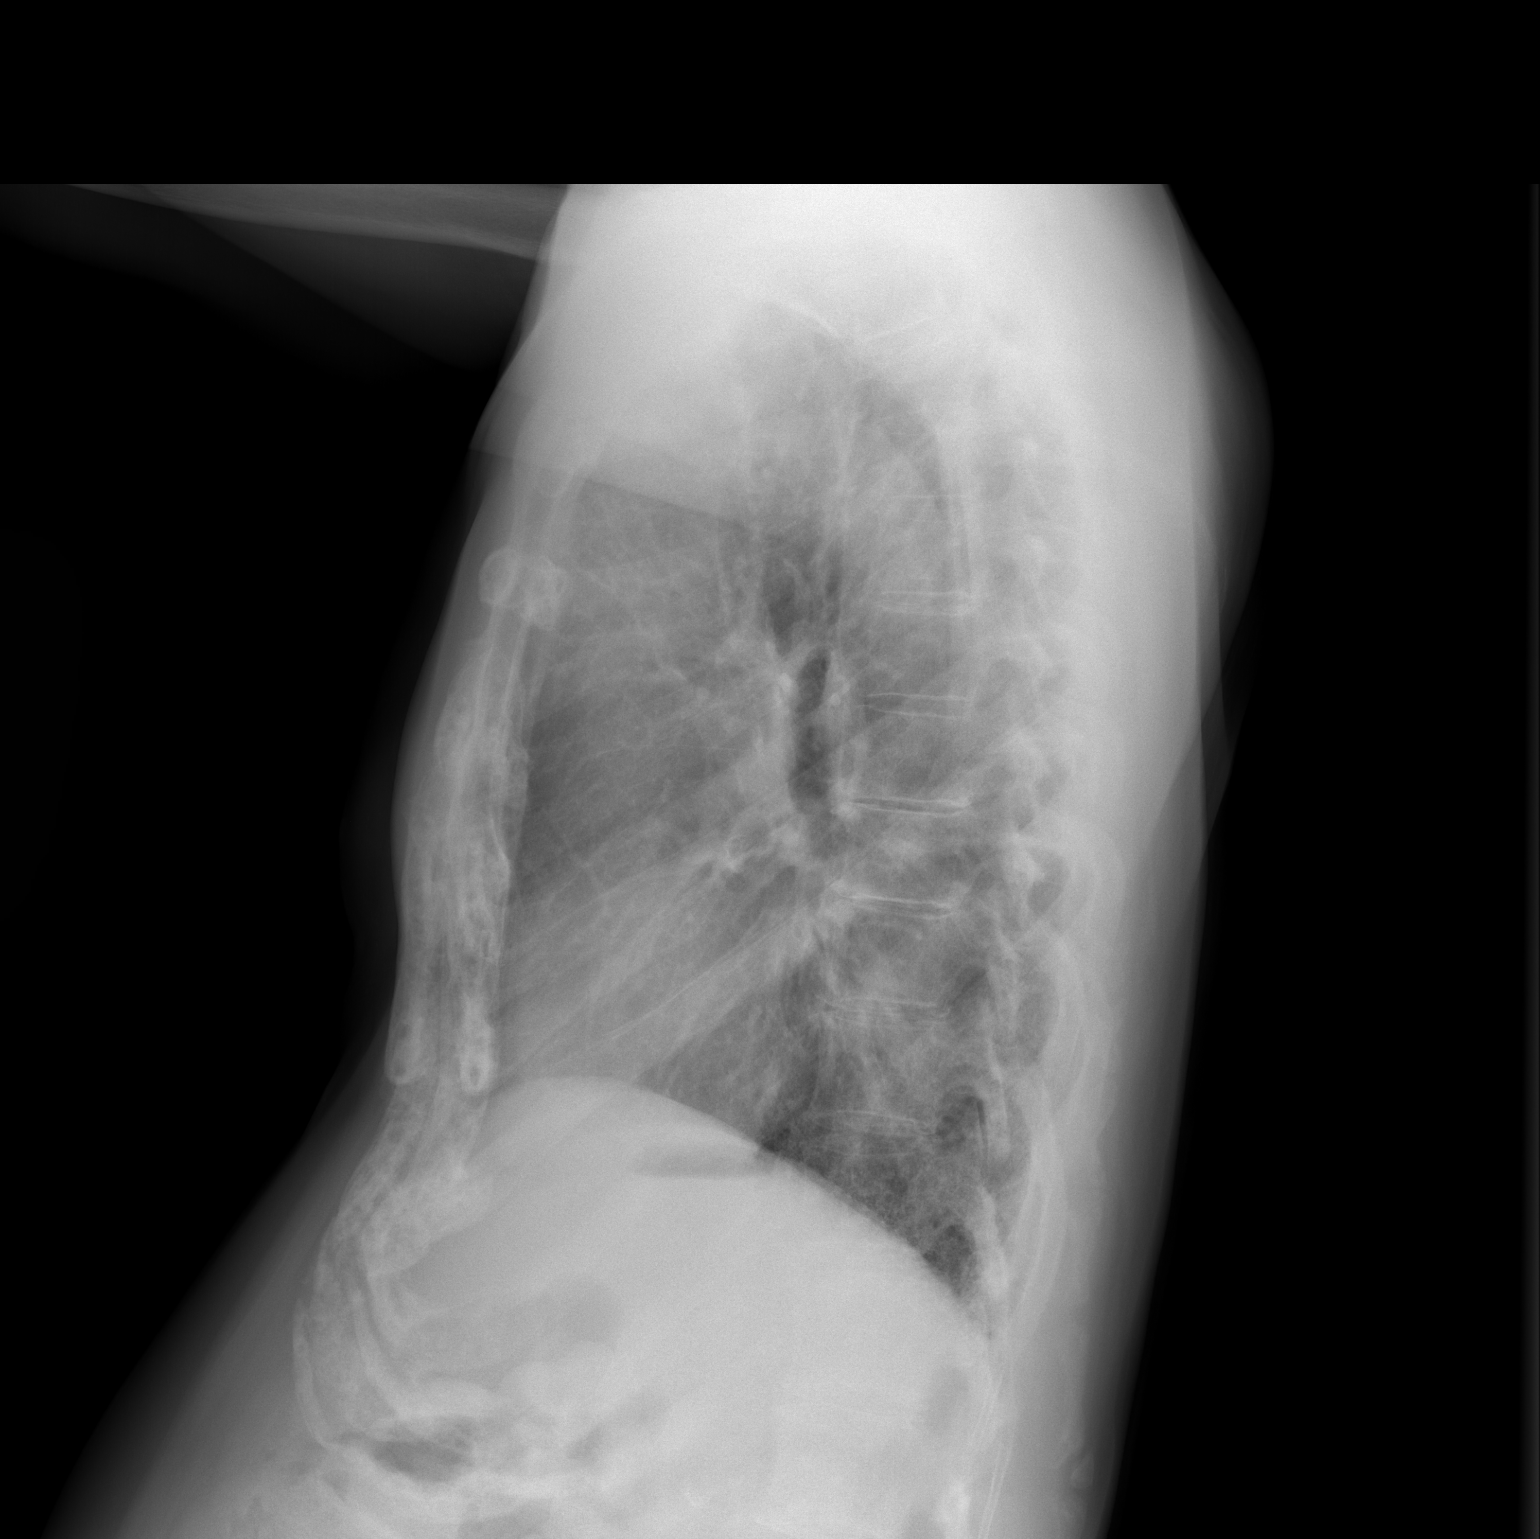

[2 of 2 positions shown; findings below may reference images not displayed]

FINDINGS: The heart size is normal. Mild interstitial coarsening is present.
No focal airspace disease is evident. There is no significant
effusion or edema. Visualized soft tissues and bony thorax are
unremarkable.
IMPRESSION: No active cardiopulmonary disease.

## 2020-07-24 ENCOUNTER — Telehealth: Payer: Self-pay

## 2020-07-24 ENCOUNTER — Other Ambulatory Visit: Payer: Self-pay

## 2020-07-24 ENCOUNTER — Encounter: Payer: Self-pay | Admitting: Internal Medicine

## 2020-07-24 ENCOUNTER — Ambulatory Visit: Payer: Medicare Other | Admitting: Internal Medicine

## 2020-07-24 VITALS — BP 128/82 | HR 120 | Temp 98.1°F | Ht 66.0 in | Wt 127.0 lb

## 2020-07-24 DIAGNOSIS — R634 Abnormal weight loss: Secondary | ICD-10-CM

## 2020-07-24 DIAGNOSIS — J84112 Idiopathic pulmonary fibrosis: Secondary | ICD-10-CM

## 2020-07-24 DIAGNOSIS — R000 Tachycardia, unspecified: Secondary | ICD-10-CM

## 2020-07-24 DIAGNOSIS — K521 Toxic gastroenteritis and colitis: Secondary | ICD-10-CM | POA: Diagnosis not present

## 2020-07-24 NOTE — Patient Instructions (Addendum)
ICD-10-CM   1. IPF (idiopathic pulmonary fibrosis) ([facility])  J84.112   2. Diarrhea due to drug  K52.1   3. Weight loss, non-intentional  R63.4   4. Tachycardia  R00.0      IPF (idiopathic pulmonary fibrosis) (HCC) Diarrhea due to drug   -Clinically appears to be stable -Last 2 months of taking 100 mg of nintedanib with the [place] version by [facility] called NINTIB -Liver function test in [place] reviewed and was normal.  Most recent liver function test in [place] by urology is normal per your report -New onset diarrhea after returning back from [place] and this is due to nintedanib  Plan -Continue [place] version of nintedanib -Take Imodium on a scheduled basis 2 or 3 times a week to control the diarrhea -Avoid sugar and sugar substitute and caffeine in order to control the diarrhea better -We will initiate paperwork for [place] nintedanib called OFEV -Do spirometry and DLCO in May 2022 -High-resolution CT chest in September 2022   Weight loss, non-intentional  -This started even before you started nintedanib.  Is still ongoing.  This could be because of IPF and nintedanib combined.  Do not know if other reasons are playing a role.  Regardless this is not a good sign.  Plan -We will continue to monitor -Continue to follow with primary care physician about this    Ttachycardia  -You have resting tachycardia and this could be because of pulmonary fibrosis but do not know if there are any cardiac issues  Plan -Do 2D echocardiogram -and we will call you with the results -Also talk to primary care physician and you might need a cardiology referral -We might need to consider beta-blockers for this  Follow-up -Return in May 2022 for a 30-minute visit with Dr. Chase Caller but after completing breathing test

## 2020-07-24 NOTE — Telephone Encounter (Signed)
Did patient take an application home today?

## 2020-07-24 NOTE — Telephone Encounter (Signed)
Patient stated he has already filled out paperwork for OFEV. Would someone please confirm that this has been done?

## 2020-07-24 NOTE — Telephone Encounter (Signed)
No. I had tried to give him the paperwork but he insisted he already did them

## 2020-07-24 NOTE — Telephone Encounter (Signed)
Patient was enrolled in [place] patient assistance for 2021. He will need to complete a new form for 2022.

## 2020-07-24 NOTE — Progress Notes (Signed)
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   Prior cough in  2019 for several months  resolved on on some kind of powder inhaler x one week = Advair     History of Present Illness  08/04/2019  Pulmonary/ 1st office eval/[name]  Chief Complaint  Patient presents with  . Pulmonary Consult    Referred by Dr [name]. Pt c/o non prod cough and throat clearing x 6 months.   Dyspnea:  None/ no aerobics, walking neighborhoods ok  Cough: always dry/ any candy helps if keeps in mouth but esp hard candy / does not use cough drops  Sleep: rotates on side, bed is flat/ one pillow, never wakes up cough but /win 15 min of stirring recurs daily  SABA use: never, also never prednisone Always trouble swallowing pills x lifetime [name] Reflux v Neurogenic Cough Differentiator Reflux Comments  Do you awaken from a sound sleep coughing violently?                            With trouble breathing? none   Do you have choking episodes when you cannot  Get enough air, gasping for air ?              none   Do you usually cough when you lie down into  The bed, or when you just lie down to rest ?                          No    Do you usually cough after meals or eating?         Yes after bfast   Do you cough when (or after) you bend over?    no   GERD SCORE     [name] Reflux v Neurogenic Cough Differentiator Neurogenic   Do you more-or-less cough all day long? sporadic   Does change of  temperature make you cough? None    Does laughing or chuckling cause you to cough? yes   Do fumes (perfume, automobile fumes, burned  Toast, etc.,) cause you to cough ?      Yes    Does speaking, singing, or talking on the phone cause you to cough   ?               Yes    Neurogenic/Airway score     rec Call [name] office and ask what inhaler you received in 2019 as sample that seemed to help your cough = advair dpi Stop cetrizine, flonase  Prednisone 10 mg take  4 each am x 2 days,   2 each am x 2 days,  1 each am x 2 days and stop  Pantoprazole (protonix) 40 mg   Take  30-60 min before first meal  of the day and Pepcid (famotidine)  20 mg one after supper  until return to office  GERD   Gabapentin 100 mg four times daily (ok to stop when no coughing at all for a week)  Please schedule a follow up office visit in 4 weeks, sooner if needed  with all medications /inhalers/ solutions in hand so we can verify exactly what you are taking. This includes all medications from all doctors and over the counters    09/06/2019  f/u ov/[name] re: uacs on gabapenitn 100 mg three times  wolicki had tried gabapentin but failed to reveal that at last ov when I recommended it  Chief Complaint  Patient presents with  . Follow-up    4 wk f/u. Wants to discuss switching Advair.   Dyspnea:  No  Cough: better just while on prednisone maybe 50-80%   Sleeping: no noct cough SABA use: none 02: none  rec Gabapentin 200 mg up to 4 x daily  Pantoprazole (protonix) 40 mg   Take  30-60 min before first meal of the day and Pepcid (famotidine)  20 mg one @  bedtime until return to office - this is the best way to tell whether stomach acid is contributing to your problem.   Keep the candy handy  Prednisone 10 mg  Take 4 for three days 3 for three days 2 for three days 1 for three days and stop  If not better in 2 weeks see [name] for your reflux  Please remember to go to the lab department and xray dept  for your  tests - we will call you with the results when they are available.         OV 10/05/2019  Subjective:  [name], male , DOB: 11/28/1940 , age 80 y.o. , MRN: [id number] , ADDRESS: [place] [address]   10/05/2019 -   Chief Complaint  Patient presents with  . Follow-up    Pt states he has been doing okay since last visit and denies any complaints.   -80 year old [place] male.  Referred for interstitial lung disease.  He is accompanied by his son [name] who is a Software engineer.  I know [name]  from a few years ago when heused to accompany his aunt; asthma patient of mine. Jeff Willis  has has been referred by [name] to the ILD center.  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  Up until the pandemic used to travel quite frequently.  He is to go to [place] for 3 months.  At none of the properties he is being exposed to mold.  He developed insidious onset of chronic cough over a year ago this has been persistent.  In the last few months several cough related treatments have been tried with some improvement.  Has been on gabapentin but this made him sleepy and is currently stopped it.  Because of chronic cough which he only attributes his constant clearing of the throat he had a high-resolution CT scan of the chest that I personally visualized and shows probable UIP pattern and therefore he has been referred here.  In my personal visualization it might even be definite of UIP because there might be some early honeycombing there.  He denies any shortness of breath.  At rest he was found to be tachycardic but he says this can at baseline for him.  He will follow this up with his primary care physician.  [place] [facility] Comprehensive( ILD Questionnaire  Symptoms:   He denies any shortness of breath.  No episodic shortness of breath.  However on the questioning he does report mild shortness of breath climbing up stairs.  In  terms of his cough it started in June 2020.  It is the same since it started maybe in the last week it is slightly better.  Is moderate in intensity.  He does clear the throat.  Mostly dry cough occasionally white sputum.  Does feel a tickle in the back of his throat.  He is not waking up in the middle of the night.  Occasionally affects his voice.  No nausea no vomiting no diarrhea no wheezing.  SYMPTOM SCALE - ILD 10/05/2019   O2 use ra  Shortness of Breath 0 -> 5 scale with 5 being worst (score 6 If unable to do)  At rest 0  Simple tasks - showers, clothes change, eating, shaving 0  Household (dishes, doing bed, laundry) 0  Shopping 0  Walking level at own pace 0  Walking up Stairs 1  Total (30-36) Dyspnea Score 1  How bad is your cough? 2  How bad is your fatigue 0  How bad is nausea 0  How bad is vomiting?  00  How bad is diarrhea? 0  How bad is anxiety? 00  How bad is depression 0      Past Medical History : Denies any asthma COPD or heart failure rheumatoid arthritis or scleroderma or lupus or polymyositis or Sjogren's.  Denies sleep apnea.  Denies HIV denies pulmonary hypertension.  Denies diabetes or thyroid disease or stroke or seizures mononucleosis.  Denies any hepatitis or tuberculosis or kidney disease or pneumonia or blood clots or heart disease or pleurisy.  Does have on and off acid reflux for the last year   ROS: Positive for dry mouth for the last several months.  He also has some dysphagia for swallowing pills unclear for what duration.  He also has acid reflux on PPI and H2 blocker.  Denies any Raynaud's or recurrent fever or weight loss.  No nausea no vomiting.   FAMILY HISTORY of LUNG DISEASE: * -Denies family history of COPD or asthma sarcoid or cystic fibrosis of hypersensitive pneumonitis or autoimmune disease.   EXPOSURE HISTORY: Denies any Covid.  Denies smoking cigarettes.  Denies marijuana denies smoking.  Denies IV drug use denies cocaine   HOME and  HOBBY DETAILS : Single-family home [place] for the last 10 years.  Age of the home is 15 years.  No dampness.  No mildew.  No humidifier use.  No CPAP use no nebulizer machine use.  He does use a steam iron but there is no mold or mildew in it.  There is no [place] inside the house no pet birds or parakeets.  No pet gerbils no feather pillows no mold in the [facility] duct no music habits.  He does do some gardening very small garden in the house limited gardening mostly just water the garden.  No bird feather exposure no flood damage no strong mats no hot tub or Jacuzzi.  No exposure to animals at work.   OCCUPATIONAL HISTORY (122 questions) :   -Organic antigen exposure is negative inorganic exposure is negative   PULMONARY TOXICITY HISTORY (27 items):  -18-day prednisone course that ended last week.  He did have seen implantation for prostate cancer in October 2019 these were radiation seeds.      Results  for TAMARI, BUSIC (MRN [id number]) as of 11/15/2019 16:27  Ref. Range 09/07/2019 11:29 10/05/2019 11:25  Anti Nuclear Antibody (ANA) Latest Ref Range: NEGATIVE   POSITIVE (A)  ANA Pattern 1 Unknown  Nuclear, Speckled (A)  ANA Titer 1 Latest Units: titer  1:40 (H)  Angiotensin-Converting Enzyme Latest Ref Range: 9 - 67 U/L  34  Cyclic Citrullin Peptide Ab Latest Units: UNITS  <16  ds DNA Ab Latest Units: IU/mL  <1  Myeloperoxidase Abs Latest Units: AI  <1.0  Serine Protease 3 Latest Units: AI  <1.0  RA Latex Turbid. Latest Ref Range: <14 IU/mL  <14  IgE (Immunoglobulin E), Serum Latest Ref Range: <OR=114 kU/L 36   SSA (Ro) (ENA) Antibody, IgG Latest Ref Range: <1.0 NEG AI  <1.0 NEG  SSB (La) (ENA) Antibody, IgG Latest Ref Range: <1.0 NEG AI  <1.0 NEG  Scleroderma (Scl-70) (ENA) Antibody, IgG Latest Ref Range: <1.0 NEG AI  <1.0 NEG     Simple office walk 185 feet x  3 laps goal with forehead probe 10/05/2019   O2 used ra  Number laps completed 3  Comments about pace avt   Resting Pulse Ox/HR 100% and 122/min  Final Pulse Ox/HR 100% and 143/min  Desaturated </= 88% x  Desaturated <= 3% points x  Got Tachycardic >/= 90/min x  Symptoms at end of test x  Miscellaneous comments x    Results for Jeff, Willis (MRN [id number]) as of 10/05/2019 10:40  Ref. Range 09/07/2019 11:29  Eosinophils Absolute Latest Ref Range: 0.0 - 0.7 K/uL 0.1     High-resolution CT chest Sep 15, 2019: Personally visualized and agree with the findings below IMPRESSION: 1. Spectrum of findings compatible with basilar predominant fibrotic interstitial lung disease with suggestion of early honeycombing. 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 [place], Iss 5, [phone], Dec 28 2016. 2. Ectatic 4.4 cm ascending thoracic aorta. Recommend annual imaging followup by CTA or MRA. This recommendation follows 2010 ACCF/AHA/AATS/ACR/ASA/SCA/SCAI/SIR/STS/SVM Guidelines for the Diagnosis and Management of Patients with Thoracic Aortic Disease. Circulation. 2010; 121: S010-X323. Aortic aneurysm NOS (ICD10-I71.9). 3. One vessel coronary atherosclerosis. 4. Aortic Atherosclerosis (ICD10-I70.0).   Electronically Signed   By: Ilona Sorrel M.D.   On: 09/15/2019 14:26    Results for Jeff, Willis (MRN [id number]) as of 10/05/2019 10:40  Ref. Range 09/07/2019 11:29  Sheep Sorrel IgE Latest Units: kU/L <0.10  Pecan/Hickory Tree IgE Latest Units: kU/L <0.10  IgE (Immunoglobulin E), Serum Latest Ref Range: <OR=114 kU/L 36  Allergen, D pternoyssinus,d7 Latest Units: kU/L <0.10  Cat Dander Latest Units: kU/L <0.10  Dog Dander Latest Units: kU/L <0.10  [place] Grass Latest Units: kU/L <0.10  Johnson Grass Latest Units: kU/L <0.10  Timothy Grass Latest Units: kU/L <0.10  Cockroach Latest Units: kU/L <0.10  Aspergillus fumigatus,  m3 Latest Units: kU/L <0.10  Allergen, Comm Silver Wendee Copp, t9 Latest Units: kU/L <0.10  Allergen, Cottonwood, t14 Latest Units: kU/L <0.10  Elm IgE Latest Units: kU/L <0.10  Allergen, Mulberry, t76 Latest Units: kU/L <0.10  Allergen, Oak,t7 Latest Units: kU/L <0.10  COMMON RAGWEED (SHORT) (W1) IGE Latest Units: kU/L 0.15 (H)  Allergen, Mouse Urine Protein, e78 Latest Units: kU/L <0.10  D. farinae Latest Units: kU/L <0.10  Allergen, Cedar tree, t12 Latest Units: kU/L <0.10  Box Elder IgE Latest Units: kU/L <0.10  Rough Pigweed  IgE  Latest Units: kU/L <0.10    ROS - per HPI   OV 11/15/2019  Subjective:  [name], male , DOB: Nov 09, 1940 , age 63 y.o. , MRN: [id number] , ADDRESS: [place] [address]   11/15/2019 -   Chief Complaint  Patient presents with  . Follow-up    Pt states the cough has been better since last visit.      HPI Golden 80 y.o. -presents with his son [name] for follow-up.  He is here to discuss test results.  In the interim he called for worsening cough.  We gave him prednisone.  He tells me the prednisone actually made the cough worse particularly towards the end.  After that the son  doubled up his PPI and also added H2 blockade and the cough went away completely.  Patient feels completely normal that he is possible that he has interstitial lung disease.  We reviewed the scan and I visualized it with him together and his son.  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  They wanted to discuss the implications of the scan in the setting of controlled acid reflux and controlled cough but he is largely asymptomatic at this point.  Noted his serologies were negative except for trace ANA and his overnight oxygen study was normal.     OV 01/17/2020   Subjective:  [name], male , DOB: 23-Aug-1940, age 80 y.o. years. , MRN: [id number],  ADDRESS: [place] [address] PCP  Gaynelle Arabian,  MD Providers : Treatment Team:  Attending Provider: Brand Males, MD   Chief Complaint  Patient presents with  . Follow-up    pt is here to go over ct and pft    Follow-up interstitial lung disease Follow-up associated cough partially controlled with acid reflux treatment Associated weight loss present   HPI Jeff Willis 80 y.o. -presents with his son [name].  In the interim he is continue to lose weight.  He has lost another 3-4 pounds since June 2021.  His clothes are much looser.  He thinks he has been steadily losing weight.  His respiratory symptoms are not any worse although he says that his cough that seem to initially improve with doubling up of acid reflux therapy seems to persist.  At last visit in September 2020 when I thought the cough resolved but they tell me the cough only partially improved.  Nevertheless it is persisting and they are worried about it.  In addition he is worried about weight loss.  He also has poor appetite.  He is not in any pulmonary fibrosis antifibrotic's.  There is no diarrhea.  Symptom score suggest some element of anxiety since his last visit.  He has had pulmonary function test and it shows his FVC is actually better but his DLCO is actually worse.  He had high-resolution CT chest that I visualized and reviewed with him.  It shows probable UIP with possible emerging honeycombing.  I agree with the radiologist.  The radiologist actually feels that it might be a little bit worse compared to few months ago.  Of note: He has upcoming trip to [place] between March 05, 2020 and February 2022.  He wants to make sure his care is coordinated.  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 Evaluate for pulmonary fibrosis.  EXAM: CT CHEST WITHOUT CONTRAST  TECHNIQUE: Multidetector CT imaging of the chest was  performed following the standard protocol without intravenous contrast. High resolution imaging of the lungs, as well as inspiratory and expiratory imaging, was performed.  COMPARISON:  Chest CT 09/15/2019.  FINDINGS: Cardiovascular: Heart size is normal. There is no significant pericardial fluid, thickening or pericardial calcification. There is aortic atherosclerosis, as well as atherosclerosis of the great vessels of the mediastinum and the coronary arteries, including calcified atherosclerotic plaque in the left main, left anterior descending and right coronary arteries. Dilatation of the ascending thoracic aorta measuring 4.5 cm in diameter.  Mediastinum/Nodes: No pathologically enlarged mediastinal or hilar lymph nodes. Please note that accurate exclusion of hilar adenopathy is limited on noncontrast CT scans. Esophagus is unremarkable in appearance. No axillary lymphadenopathy.  Lungs/Pleura: High-resolution images demonstrate widespread areas of ground-glass attenuation, septal thickening, thickening of the peribronchovascular interstitium, mild cylindrical bronchiectasis and peripheral bronchiolectasis. There is a suggestion of, but not definitive evidence of, early honeycombing in portions of the lungs. These findings do have a definitive craniocaudal gradient and appear slightly progressive compared to the prior study. Inspiratory and expiratory imaging is unremarkable. No acute consolidative airspace disease. No pleural effusions. No definite suspicious appearing pulmonary nodules or masses are noted.  Upper Abdomen: Cortical calcification in the upper pole of the right kidney incidentally noted.  Musculoskeletal: There are no aggressive appearing lytic or blastic lesions noted in the visualized portions of the skeleton.  IMPRESSION: 1. The appearance of the lungs is compatible with progressive interstitial lung disease categorized as probable usual  interstitial pneumonia (UIP) per current ATS guidelines. 2. Aortic atherosclerosis, in addition to left main and 2 vessel coronary artery disease. In addition, there is mild aneurysmal dilatation of the ascending thoracic aorta (4.5 cm in diameter). Ascending thoracic aortic aneurysm. Recommend semi-annual imaging followup by CTA or MRA and referral to cardiothoracic surgery if not already obtained. This recommendation follows 2010 ACCF/AHA/AATS/ACR/ASA/SCA/SCAI/SIR/STS/SVM Guidelines for the Diagnosis and Management of Patients With Thoracic Aortic Disease. Circulation. 2010; 121: S811-S315. Aortic aneurysm NOS (ICD10-I71.9).  Aortic Atherosclerosis (ICD10-I70.0). Aortic aneurysm NOS (ICD10-I71.9).   Electronically Signed   By: Vinnie Langton M.D.   On: 01/13/2020 15:15   ROS - per HPI    OV 03/01/2020  Subjective:  [name], male , DOB: 1940-05-02 , age 26 y.o. , MRN: [id number] , ADDRESS: [place] [address] PCP Gaynelle Arabian, MD Patient Care Team: Gaynelle Arabian, MD as PCP - General (Family Medicine)  This Provider for this visit: Treatment Team:  Attending Provider: Brand Males, MD  FU IPF Associated weight loss + pre-ofev   03/01/2020 -   Chief Complaint  Patient presents with  . Follow-up    ILD, doing ok, cough has not improved     HPI Jeff Willis 80 y.o. -returns for follow-up.  This is after starting nintedanib.  He is currently on 100 mg twice daily for 2 weeks.  His prescription is approved on February 02, 2020.  He accidentally got 150 mg twice daily but he sent this back.  He is going to send [place] back.  He has upcoming travel to [place] later this week he will be gone there for till February 2022.  He has supply and refill of his nintedanib on 06 March 2020.  After that he does not have nintedanib.  He and his son report that somebody will be able to go to [place] and deliver it  to him.  They  do not want to trust the courier service to take it there the supply chain issues.  At this point time from a respiratory standpoint he has minimal dyspnea.  He still has a cough.  The cough is not worse but after reducing the PPI dose the cough has come back.  It is somewhat troublesome.  The severity score is listed below.  They report the GI Dr. Collene Mares has reported concern about taking very high-dose of PPI and H2 blockade which actually helped the cough.  Is because of osteoporosis risk.  I agreed with that concern.  However patient is also reporting significant symptom burden.  Primary care has suggested that they follow the symptom control and quality of life issues.  I did support the primary care viewpoint as well.  Did explain to them at the end of the day as a risk-benefit ratio and with an advanced disease like pulmonary fibrosis 1 might have to consider taking a short-term again with cough control at the expense of long-term risk of side effects.  They are going to think about this.  In terms of his travel to [place].  He put me in touch with Dr. Posey Pronto.  I texted him and establish contact.  He will follow with Dr. Posey Pronto who is a pulmonologist trained in [place] but now living in [place].  Gave other travel advice are listed below.  In terms of weight loss: This is ongoing.  Even after starting the Ofev is not having any GI side effects but his weight loss continues.  He had a CT abdomen that is not showing any reason for weight loss.  Apparently had some hematuria had cystoscopy with Dr. Junious Silk.  He has some radiation seed implants.  There is no bladder cancer.  The really frustrated by this.  I did pull out an article about IPF and weight loss for the son to read.- shows IPF weight loss is asssocited with worse progrnosis   Wt Readings from Last 3 Encounters:  03/01/20 131 lb 6.4 oz (59.6 kg)  01/17/20 134 lb 12.8 oz (61.1 kg)  11/15/19 138 lb (62.6 kg)     SYMPTOM SCALE - ILD 10/05/2019 137 pounds  01/17/2020 134 pounds 03/01/2020 131#  O2 use ra ra ra  Shortness of Breath 0 -> 5 scale with 5 being worst (score 6 If unable to do)    At rest 0 0 0  Simple tasks - showers, clothes change, eating, shaving 0 0 0  Household (dishes, doing bed, laundry) 0 1 1  Shopping 0 0 0  Walking level at own pace 0 0 0  Walking up Stairs 1 0 1  Total (30-36) Dyspnea Score 1 1 2   How bad is your cough? 2 3 0  How bad is your fatigue 0 0 0  How bad is nausea 0 0 0  How bad is vomiting?  00 0 0  How bad is diarrhea? 0 0 0  How bad is anxiety? 00 1 1  How bad is depression 0 1 1       Simple office walk 185 feet x  3 laps goal with forehead probe 10/05/2019  03/01/2020   O2 used ra ra  Number laps completed 3   Comments about pace avt   Resting Pulse Ox/HR 100% and 122/min 98% and 110  Final Pulse Ox/HR 100% and 143/min 97% and 130  Desaturated </= 88% x x  Desaturated <= 3% points x  x  Got Tachycardic >/= 90/min x x  Symptoms at end of test x x  Miscellaneous comments x x   PFT Results Latest Ref Rng & Units 01/17/2020 10/06/2019  FVC-Pre L - 2.59  FVC-Predicted Pre % 83 78  Pre FEV1/FVC % % 86 86  FEV1-Pre L 2.37 2.24  FEV1-Predicted Pre % 95 89  DLCO uncorrected ml/min/mmHg 13.79 15.70  DLCO UNC% % 48 55  DLCO corrected ml/min/mmHg 13.79 15.70  DLCO COR %Predicted % 48 55  DLVA Predicted % 86 [place], male    DOB: April 10, 1941,    MRN: [id number]   Brief patient profile:  80 yo [place] male  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   Prior cough in   2019 for several months  resolved on on some kind of powder inhaler x one week = Advair     History of Present Illness  08/04/2019  Pulmonary/ 1st office eval/[name]  Chief Complaint  Patient presents with  . Pulmonary Consult    Referred by Dr [name]. Pt c/o non prod cough and throat clearing x 6 months.   Dyspnea:  None/ no aerobics, walking neighborhoods ok  Cough: always dry/ any candy helps if keeps in mouth but esp hard candy / does not use cough drops  Sleep: rotates on side, bed is flat/ one pillow, never wakes up cough but /win 15 min of stirring recurs daily  SABA use: never, also never prednisone Always trouble swallowing pills x lifetime [name] Reflux v Neurogenic Cough Differentiator Reflux Comments  Do you awaken from a sound sleep coughing violently?                            With trouble breathing? none   Do you have choking episodes when you cannot  Get enough air, gasping for air ?              none   Do you usually cough when you lie down into  The bed, or when you just lie down to rest ?                          No    Do you usually cough after meals or eating?         Yes after bfast   Do you cough when (or after) you bend over?    no   GERD SCORE     [name] Reflux v Neurogenic Cough Differentiator Neurogenic   Do you more-or-less cough all day long? sporadic   Does change of temperature make you cough? None    Does laughing or chuckling cause you to cough? yes   Do fumes (perfume, automobile fumes, burned  Toast, etc.,) cause you to cough ?      Yes    Does speaking, singing, or talking on the phone cause you to cough   ?  Yes    Neurogenic/Airway score     rec Call [name] office and ask what inhaler you received in 2019 as sample that seemed to help your cough = advair dpi Stop cetrizine, flonase  Prednisone 10 mg take  4 each am x 2 days,   2 each am x 2 days,  1 each am x 2 days and stop  Pantoprazole (protonix) 40 mg   Take  30-60 min  before first meal of the day and Pepcid (famotidine)  20 mg one after supper  until return to office  GERD   Gabapentin 100 mg four times daily (ok to stop when no coughing at all for a week)  Please schedule a follow up office visit in 4 weeks, sooner if needed  with all medications /inhalers/ solutions in hand so we can verify exactly what you are taking. This includes all medications from all doctors and over the counters    09/06/2019  f/u ov/[name] re: uacs on gabapenitn 100 mg three times  wolicki had tried gabapentin but failed to reveal that at last ov when I recommended it  Chief Complaint  Patient presents with  . Follow-up    4 wk f/u. Wants to discuss switching Advair.   Dyspnea:  No  Cough: better just while on prednisone maybe 50-80%   Sleeping: no noct cough SABA use: none 02: none  rec Gabapentin 200 mg up to 4 x daily  Pantoprazole (protonix) 40 mg   Take  30-60 min before first meal of the day and Pepcid (famotidine)  20 mg one @  bedtime until return to office - this is the best way to tell whether stomach acid is contributing to your problem.   Keep the candy handy  Prednisone 10 mg  Take 4 for three days 3 for three days 2 for three days 1 for three days and stop  If not better in 2 weeks see [name] for your reflux  Please remember to go to the lab department and xray dept  for your tests - we will call you with the results when they are available.         OV 10/05/2019  Subjective:  [name], male , DOB: 1940-07-28 , age 76 y.o. , MRN: [id number] , ADDRESS: [place] [address]   10/05/2019 -   Chief Complaint  Patient presents with  . Follow-up    Pt states he has been doing okay since last visit and denies any complaints.   -80 year old [place] male.  Referred for interstitial lung disease.  He is accompanied by his son [name] who is a Software engineer.  I know [name]  from a few years ago when heused to accompany  his aunt; asthma patient of mine. Ronit MONTEE TALLMAN  has has been referred by [name] to the ILD center.  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  Up until the pandemic used to travel quite frequently.  He is to go to [place] for 3 months.  At none of the properties he is being exposed to mold.  He developed insidious onset of chronic cough over a year ago this has been persistent.  In the last few months several cough related treatments have been tried with some improvement.  Has been on gabapentin but this made him sleepy and is currently stopped it.  Because of chronic cough which he only attributes his constant clearing of the throat  he had a high-resolution CT scan of the chest that I personally visualized and shows probable UIP pattern and therefore he has been referred here.  In my personal visualization it might even be definite of UIP because there might be some early honeycombing there.  He denies any shortness of breath.  At rest he was found to be tachycardic but he says this can at baseline for him.  He will follow this up with his primary care physician.  [facility] Comprehensive( ILD Questionnaire  Symptoms:   He denies any shortness of breath.  No episodic shortness of breath.  However on the questioning he does report mild shortness of breath climbing up stairs.  In terms of his cough it started in June 2020.  It is the same since it started maybe in the last week it is slightly better.  Is moderate in intensity.  He does clear the throat.  Mostly dry cough occasionally white sputum.  Does feel a tickle in the back of his throat.  He is not waking up in the middle of the night.  Occasionally affects his voice.  No nausea no vomiting no diarrhea no wheezing.  SYMPTOM SCALE - ILD 10/05/2019   O2 use ra  Shortness of Breath 0 -> 5 scale with 5 being worst (score 6 If unable to do)  At rest 0  Simple tasks - showers, clothes change, eating, shaving 0   Household (dishes, doing bed, laundry) 0  Shopping 0  Walking level at own pace 0  Walking up Stairs 1  Total (30-36) Dyspnea Score 1  How bad is your cough? 2  How bad is your fatigue 0  How bad is nausea 0  How bad is vomiting?  00  How bad is diarrhea? 0  How bad is anxiety? 00  How bad is depression 0      Past Medical History : Denies any asthma COPD or heart failure rheumatoid arthritis or scleroderma or lupus or polymyositis or Sjogren's.  Denies sleep apnea.  Denies HIV denies pulmonary hypertension.  Denies diabetes or thyroid disease or stroke or seizures mononucleosis.  Denies any hepatitis or tuberculosis or kidney disease or pneumonia or blood clots or heart disease or pleurisy.  Does have on and off acid reflux for the last year   ROS: Positive for dry mouth for the last several months.  He also has some dysphagia for swallowing pills unclear for what duration.  He also has acid reflux on PPI and H2 blocker.  Denies any Raynaud's or recurrent fever or weight loss.  No nausea no vomiting.   FAMILY HISTORY of LUNG DISEASE: * -Denies family history of COPD or asthma sarcoid or cystic fibrosis of hypersensitive pneumonitis or autoimmune disease.   EXPOSURE HISTORY: Denies any Covid.  Denies smoking cigarettes.  Denies marijuana denies smoking.  Denies IV drug use denies cocaine   HOME and HOBBY DETAILS : Single-family home [place] for the last 10 years.  Age of the home is 15 years.  No dampness.  No mildew.  No humidifier use.  No CPAP use no nebulizer machine use.  He does use a steam iron but there is no mold or mildew in it.  There is no [place] inside the house no pet birds or parakeets.  No pet gerbils no feather pillows no mold in the [facility] duct no music habits.  He does do some gardening very small garden in the house limited gardening mostly just  water the garden.  No bird feather exposure no flood damage no strong mats no hot tub or Jacuzzi.  No  exposure to animals at work.   OCCUPATIONAL HISTORY (122 questions) :   -Organic antigen exposure is negative inorganic exposure is negative   PULMONARY TOXICITY HISTORY (27 items):  -18-day prednisone course that ended last week.  He did have seen implantation for prostate cancer in October 2019 these were radiation seeds.      Results for Jeff, Willis (MRN [id number]) as of 11/15/2019 16:27  Ref. Range 09/07/2019 11:29 10/05/2019 11:25  Anti Nuclear Antibody (ANA) Latest Ref Range: NEGATIVE   POSITIVE (A)  ANA Pattern 1 Unknown  Nuclear, Speckled (A)  ANA Titer 1 Latest Units: titer  1:40 (H)  Angiotensin-Converting Enzyme Latest Ref Range: 9 - 67 U/L  34  Cyclic Citrullin Peptide Ab Latest Units: UNITS  <16  ds DNA Ab Latest Units: IU/mL  <1  Myeloperoxidase Abs Latest Units: AI  <1.0  Serine Protease 3 Latest Units: AI  <1.0  RA Latex Turbid. Latest Ref Range: <14 IU/mL  <14  IgE (Immunoglobulin E), Serum Latest Ref Range: <OR=114 kU/L 36   SSA (Ro) (ENA) Antibody, IgG Latest Ref Range: <1.0 NEG AI  <1.0 NEG  SSB (La) (ENA) Antibody, IgG Latest Ref Range: <1.0 NEG AI  <1.0 NEG  Scleroderma (Scl-70) (ENA) Antibody, IgG Latest Ref Range: <1.0 NEG AI  <1.0 NEG     Simple office walk 185 feet x  3 laps goal with forehead probe 10/05/2019   O2 used ra  Number laps completed 3  Comments about pace avt  Resting Pulse Ox/HR 100% and 122/min  Final Pulse Ox/HR 100% and 143/min  Desaturated </= 88% x  Desaturated <= 3% points x  Got Tachycardic >/= 90/min x  Symptoms at end of test x  Miscellaneous comments x    Results for ZIMERE, DUNLEVY (MRN [id number]) as of 10/05/2019 10:40  Ref. Range 09/07/2019 11:29  Eosinophils Absolute Latest Ref Range: 0.0 - 0.7 K/uL 0.1     High-resolution CT chest Sep 15, 2019: Personally visualized and agree with the findings below IMPRESSION: 1. Spectrum of findings compatible with basilar predominant fibrotic interstitial lung  disease with suggestion of early honeycombing. 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 [place], Iss 5, [phone], Dec 28 2016. 2. Ectatic 4.4 cm ascending thoracic aorta. Recommend annual imaging followup by CTA or MRA. This recommendation follows 2010 ACCF/AHA/AATS/ACR/ASA/SCA/SCAI/SIR/STS/SVM Guidelines for the Diagnosis and Management of Patients with Thoracic Aortic Disease. Circulation. 2010; 121: S568-L275. Aortic aneurysm NOS (ICD10-I71.9). 3. One vessel coronary atherosclerosis. 4. Aortic Atherosclerosis (ICD10-I70.0).   Electronically Signed   By: Ilona Sorrel M.D.   On: 09/15/2019 14:26    Results for ZYIRE, EIDSON (MRN [id number]) as of 10/05/2019 10:40  Ref. Range 09/07/2019 11:29  Sheep Sorrel IgE Latest Units: kU/L <0.10  Pecan/Hickory Tree IgE Latest Units: kU/L <0.10  IgE (Immunoglobulin E), Serum Latest Ref Range: <OR=114 kU/L 36  Allergen, D pternoyssinus,d7 Latest Units: kU/L <0.10  Cat Dander Latest Units: kU/L <0.10  Dog Dander Latest Units: kU/L <0.10  [place] Grass Latest Units: kU/L <0.10  Johnson Grass Latest Units: kU/L <0.10  Timothy Grass Latest Units: kU/L <0.10  Cockroach Latest Units: kU/L <0.10  Aspergillus fumigatus, m3 Latest Units: kU/L <0.10  Allergen, Comm Silver Wendee Copp, t9 Latest Units: kU/L <  0.10  Allergen, Cottonwood, t14 Latest Units: kU/L <0.10  Elm IgE Latest Units: kU/L <0.10  Allergen, Mulberry, t76 Latest Units: kU/L <0.10  Allergen, Oak,t7 Latest Units: kU/L <0.10  COMMON RAGWEED (SHORT) (W1) IGE Latest Units: kU/L 0.15 (H)  Allergen, Mouse Urine Protein, e78 Latest Units: kU/L <0.10  D. farinae Latest Units: kU/L <0.10  Allergen, Cedar tree, t12 Latest Units: kU/L <0.10  Box Elder IgE Latest Units: kU/L <0.10  Rough Pigweed  IgE Latest Units: kU/L <0.10    ROS -  per HPI   OV 11/15/2019  Subjective:  [name], male , DOB: May 12, 1940 , age 45 y.o. , MRN: [id number] , ADDRESS: [place] [address]   11/15/2019 -   Chief Complaint  Patient presents with  . Follow-up    Pt states the cough has been better since last visit.      HPI [place] 80 y.o. -presents with his son [name] for follow-up.  He is here to discuss test results.  In the interim he called for worsening cough.  We gave him prednisone.  He tells me the prednisone actually made the cough worse particularly towards the end.  After that the son  doubled up his PPI and also added H2 blockade and the cough went away completely.  Patient feels completely normal that he is possible that he has interstitial lung disease.  We reviewed the scan and I visualized it with him together and his son.  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  They wanted to discuss the implications of the scan in the setting of controlled acid reflux and controlled cough but he is largely asymptomatic at this point.  Noted his serologies were negative except for trace ANA and his overnight oxygen study was normal.     OV 01/17/2020   Subjective:  [name], male , DOB: 12/19/1940, age 89 y.o. years. , MRN: [id number],  ADDRESS: [place] [address] PCP  Gaynelle Arabian, MD Providers : Treatment Team:  Attending Provider: Brand Males, MD   Chief Complaint  Patient presents with  . Follow-up    pt is here to go over ct and pft    Follow-up interstitial lung disease Follow-up associated cough partially controlled with acid reflux treatment Associated weight loss present   HPI Jeff Willis 80 y.o. -presents with his son [name].  In the interim he is continue to lose weight.  He has lost another 3-4 pounds since June 2021.  His clothes are much looser.  He thinks he has been steadily losing weight.  His respiratory symptoms are  not any worse although he says that his cough that seem to initially improve with doubling up of acid reflux therapy seems to persist.  At last visit in September 2020 when I thought the cough resolved but they tell me the cough only partially improved.  Nevertheless it is persisting and they are worried about it.  In addition he is worried about weight loss.  He also has poor appetite.  He is not in any pulmonary fibrosis antifibrotic's.  There is no diarrhea.  Symptom score suggest some element of anxiety since his last visit.  He has had pulmonary function test and it shows his FVC is actually better but his DLCO is actually worse.  He had high-resolution CT chest that I visualized and reviewed with him.  It shows probable UIP with possible emerging honeycombing.  I agree with the radiologist.  The radiologist actually feels that it might be a little bit worse compared to few months ago.  Of note: He has upcoming trip to [place] between March 05, 2020 and February 2022.  He wants to make sure his care is coordinated.  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 Evaluate for pulmonary fibrosis.  EXAM: CT CHEST WITHOUT CONTRAST  TECHNIQUE: Multidetector CT imaging of the chest was performed following the standard protocol without intravenous contrast. High resolution imaging of the lungs, as well as inspiratory and expiratory imaging, was performed.  COMPARISON:  Chest CT 09/15/2019.  FINDINGS: Cardiovascular: Heart size is normal. There is no significant pericardial fluid, thickening or pericardial calcification. There is aortic atherosclerosis, as well as atherosclerosis of the great vessels of the mediastinum and the coronary arteries, including calcified atherosclerotic plaque in the left main, left anterior descending and right coronary arteries.  Dilatation of the ascending thoracic aorta measuring 4.5 cm in diameter.  Mediastinum/Nodes: No pathologically enlarged mediastinal or hilar lymph nodes. Please note that accurate exclusion of hilar adenopathy is limited on noncontrast CT scans. Esophagus is unremarkable in appearance. No axillary lymphadenopathy.  Lungs/Pleura: High-resolution images demonstrate widespread areas of ground-glass attenuation, septal thickening, thickening of the peribronchovascular interstitium, mild cylindrical bronchiectasis and peripheral bronchiolectasis. There is a suggestion of, but not definitive evidence of, early honeycombing in portions of the lungs. These findings do have a definitive craniocaudal gradient and appear slightly progressive compared to the prior study. Inspiratory and expiratory imaging is unremarkable. No acute consolidative airspace disease. No pleural effusions. No definite suspicious appearing pulmonary nodules or masses are noted.  Upper Abdomen: Cortical calcification in the upper pole of the right kidney incidentally noted.  Musculoskeletal: There are no aggressive appearing lytic or blastic lesions noted in the visualized portions of the skeleton.  IMPRESSION: 1. The appearance of the lungs is compatible with progressive interstitial lung disease categorized as probable usual interstitial pneumonia (UIP) per current ATS guidelines. 2. Aortic atherosclerosis, in addition to left main and 2 vessel coronary artery disease. In addition, there is mild aneurysmal dilatation of the ascending thoracic aorta (4.5 cm in diameter). Ascending thoracic aortic aneurysm. Recommend semi-annual imaging followup by CTA or MRA and referral to cardiothoracic surgery if not already obtained. This recommendation follows 2010 ACCF/AHA/AATS/ACR/ASA/SCA/SCAI/SIR/STS/SVM Guidelines for the Diagnosis and Management of Patients With Thoracic Aortic Disease. Circulation. 2010; 121:  [id number]. Aortic aneurysm NOS (ICD10-I71.9).  Aortic Atherosclerosis (ICD10-I70.0). Aortic aneurysm NOS (ICD10-I71.9).   Electronically Signed   By: Vinnie Langton M.D.   On: 01/13/2020 15:15   ROS - per HPI    OV 03/01/2020  Subjective:  [name], male , DOB: 1940-09-29 , age 62 y.o. , MRN: [id number] , ADDRESS: [place] [address] PCP Gaynelle Arabian, MD Patient Care Team: Gaynelle Arabian, MD as PCP - General (Family Medicine)  This Provider for this visit: Treatment Team:  Attending Provider: Brand Males, MD  FU IPF Associated weight loss + pre-ofev   03/01/2020 -   Chief Complaint  Patient presents with  . Follow-up    ILD, doing ok, cough has not improved     HPI Jeff Willis 80 y.o. -returns for follow-up.  This is after starting nintedanib.  He is currently on 100 mg twice daily for 2 weeks.  His prescription is approved on  February 02, 2020.  He accidentally got 150 mg twice daily but he sent this back.  He is going to send [place] back.  He has upcoming travel to [place] later this week he will be gone there for till February 2022.  He has supply and refill of his nintedanib on 06 March 2020.  After that he does not have nintedanib.  He and his son report that somebody will be able to go to [place] and deliver it to him.  They do not want to trust the courier service to take it there the supply chain issues.  At this point time from a respiratory standpoint he has minimal dyspnea.  He still has a cough.  The cough is not worse but after reducing the PPI dose the cough has come back.  It is somewhat troublesome.  The severity score is listed below.  They report the GI Dr. Collene Mares has reported concern about taking very high-dose of PPI and H2 blockade which actually helped the cough.  Is because of osteoporosis risk.  I agreed with that concern.  However patient is also reporting significant symptom burden.  Primary care  has suggested that they follow the symptom control and quality of life issues.  I did support the primary care viewpoint as well.  Did explain to them at the end of the day as a risk-benefit ratio and with an advanced disease like pulmonary fibrosis 1 might have to consider taking a short-term again with cough control at the expense of long-term risk of side effects.  They are going to think about this.  In terms of his travel to [place].  He put me in touch with Dr. Posey Pronto.  I texted him and establish contact.  He will follow with Dr. Posey Pronto who is a pulmonologist trained in [place] but now living in [place].  Gave other travel advice are listed below.  In terms of weight loss: This is ongoing.  Even after starting the Ofev is not having any GI side effects but his weight loss continues.  He had a CT abdomen that is not showing any reason for weight loss.  Apparently had some hematuria had cystoscopy with Dr. Junious Silk.  He has some radiation seed implants.  There is no bladder cancer.  The really frustrated by this.  I did pull out an article about IPF and weight loss for the son to read.- shows IPF weight loss is asssocited with worse progrnosis   Wt Readings from Last 3 Encounters:  03/01/20 131 lb 6.4 oz (59.6 kg)  01/17/20 134 lb 12.8 oz (61.1 kg)  11/15/19 138 lb (62.6 kg)      PFT Results Latest Ref Rng & Units 01/17/2020 10/06/2019  FVC-Pre L - 2.59  FVC-Predicted Pre % 83 78  Pre FEV1/FVC % % 86 86  FEV1-Pre L 2.37 2.24  FEV1-Predicted Pre % 95 89  DLCO uncorrected ml/min/mmHg 13.79 15.70  DLCO UNC% % 48 55  DLCO corrected ml/min/mmHg 13.79 15.70  DLCO COR %Predicted % 48 55  DLVA Predicted % 81 97      ROS - per HPI    OV 07/24/2020  Subjective:  [name], male , DOB: 1940-07-06 , age 36 y.o. , MRN: [id number] , ADDRESS: [place] [address] PCP Gaynelle Arabian, MD Patient Care Team: Gaynelle Arabian, MD as PCP - General (Family  Medicine)  This Provider for this visit: Treatment Team:  Attending Provider: Brand Males, MD  07/24/2020 -   Chief Complaint  Patient presents with  . Follow-up    4 mo f/u. States his breathing has been stable since last visit. Has noticed a decrease in his appetite. Increased diarrhea for the past week.    Idiopathic pulmonary fibrosis on nintedanib 100 mg twice daily -last CT September 2021 Weight loss unintentional even before nintedanib  HPI Jeff Willis 80 y.o. -returns for follow-up.  He was in [place] and returned back in early March 2022.  He is now here with his wife.  He tells me while he was in [place] for 4 months he had a great time.  He barely had any diarrhea.  Approximately 2 months ago which is 1 month before his return he switched to an [place] version of nintedanib called NINTIB.  He was taking it for a month in [place] and did not have any problems.  Upon returning to the [place] he is starting to have diarrhea.  The diarrhea is ongoing and is 3 out of 5.  Happens 2 or 3 times a week and controlled by Imodium.  His son who is a Software engineer thinks it is because he is in the [place] version of nintedanib.  He says he spoke to the [place] pulmonologist and was told that exponentially there is no difference in diarrhea between the [place] version of nintedanib and the [place] version of nintedanib.  Diarrhea happens randomly does bother him.  He says his diet has not changed since returning to the [place].  He says he eats the same [place].  He does take sugar with tea in the morning.  In terms of his tachycardia it still is ongoing.  He is never had echocardiogram.  In terms of weight loss he continues to lose weight.  This weight loss started even before he was on nintedanib.  It is still ongoing.  He is lost 10 pounds since June 2021.  He is worried about this.    SYMPTOM SCALE - ILD 10/05/2019 137 pounds 01/17/2020 134 pounds 03/01/2020 131#  07/24/2020 127# on ofev (nintib from [place] by [facility] x 2 months) 100mg  bid  O2 use ra ra ra ra  Shortness of Breath 0 -> 5 scale with 5 being worst (score 6 If unable to do)     At rest 0 0 0 0  Simple tasks - showers, clothes change, eating, shaving 0 0 0 1  Household (dishes, doing bed, laundry) 0 1 1  0  Shopping 0 0 0 0  Walking level at own pace 0 0 0 0  Walking up Stairs 1 0 1 0  Total (30-36) Dyspnea Score 1 1 2 1   How bad is your cough? 2 3 0 2  How bad is your fatigue 0 0 0 0  How bad is nausea 0 0 0 1  How bad is vomiting?  00 0 0 0  How bad is diarrhea? 0 0 0 3  How bad is anxiety? 00 1 1 1   How bad is depression 0 1 1 1        Simple office walk 185 feet x  3 laps goal with forehead probe 10/05/2019  03/01/2020  07/24/2020   O2 used ra ra   Number laps completed 3    Comments about pace avt    Resting Pulse Ox/HR 100% and 122/min 98% and 110 1-00% and 120  Final Pulse Ox/HR 100% and 143/min 97% and 130 99% and 130  Desaturated </= 88% x x   Desaturated <= 3% points x x   Got Tachycardic >/= 90/min x x   Symptoms at end of test x x   Miscellaneous comments x x No dyspnea    PFT  PFT Results Latest Ref Rng & Units 01/17/2020 10/06/2019  FVC-Pre L - 2.59  FVC-Predicted Pre % 83 78  Pre FEV1/FVC % % 86 86  FEV1-Pre L 2.37 2.24  FEV1-Predicted Pre % 95 89  DLCO uncorrected ml/min/mmHg 13.79 15.70  DLCO UNC% % 48 55  DLCO corrected ml/min/mmHg 13.79 15.70  DLCO COR %Predicted % 48 55  DLVA Predicted % 81 97       has a past medical history of Back pain, Elevated PSA, Essential tremor, Hypercholesteremia, Neuropathy, Prostate cancer ([place]), and Sinus tachycardia (01/30/2018).   reports that he has never smoked. He has never used smokeless tobacco.  Past Surgical History:  Procedure Laterality Date  . LUMBAR MICRODISCECTOMY     L4-5  . RADIOACTIVE SEED IMPLANT N/A 02/24/2018   Procedure: RADIOACTIVE SEED IMPLANT/BRACHYTHERAPY IMPLANT;  Surgeon: Festus Aloe, MD;  Location: [facility];  Service: Urology;  Laterality: N/A;  . SPACE OAR INSTILLATION N/A 02/24/2018   Procedure: SPACE OAR INSTILLATION;  Surgeon: Festus Aloe, MD;  Location: [facility];  Service: Urology;  Laterality: N/A;    No Known Allergies  Immunization History  Administered Date(s) Administered  . Influenza, High Dose Seasonal PF 01/28/2019  . Influenza-Unspecified 01/05/2020  . PFIZER(Purple Top)SARS-COV-2 Vaccination 07/23/2019, 08/17/2019  . Pneumococcal-Unspecified 02/28/2015    Family History  Problem Relation Age of Onset  . Other Mother        died at 42 of natural causes  . Other Father        unsure of health  . Cancer Neg Hx      Current Outpatient Medications:  .  Cholecalciferol (VITAMIN D3 PO), Take 1 tablet by mouth every 30 (thirty) days., Disp: , Rfl:  .  gabapentin (NEURONTIN) 100 MG capsule, 2 pills four time a day, Disp: 240 capsule, Rfl: 2 .  Nintedanib (OFEV) 100 MG CAPS, Take 1 capsule (100 mg total) by mouth 2 (two) times daily., Disp: 180 capsule, Rfl: 3 .  pantoprazole (PROTONIX) 40 MG tablet, TAKE ONE TABLET BY MOUTH DAILY TAKE 30 TO 60 MINUTES PRIOR TO FIRST MEAL OF THE DAY, Disp: 30 tablet, Rfl: 3 .  vitamin B-12 (CYANOCOBALAMIN) 1000 MCG tablet, Take 1,000 mcg by mouth every Monday, Wednesday, and Friday. , Disp: , Rfl:       Objective:   Vitals:   07/24/20 1404  BP: 128/82  Pulse: (!) 120  Temp: 98.1 F (36.7 C)  TempSrc: Temporal  SpO2: 100%  Weight: 127 lb (57.6 kg)  Height: 5\' 6"  (1.676 m)    Estimated body mass index is 20.5 kg/m as calculated from the following:   Height as of this encounter: 5\' 6"  (1.676 m).   Weight as of this encounter: 127 lb (57.6 kg).  @WEIGHTCHANGE @  [employer]   07/24/20 1404  Weight: 127 lb (57.6 kg)     Physical Exam  General: No distress. thin Neuro: Alert and Oriented x 3. GCS 15. Speech normal Psych: Pleasant Resp:  Barrel  Chest - no.  Wheeze - no, Crackles - some at base, No overt respiratory distress CVS: Normal heart sounds. Murmurs - no Ext: Stigmata of Connective Tissue Disease - no HEENT: Normal upper airway. PEERL +. No post  nasal drip        Assessment:       ICD-10-CM   1. IPF (idiopathic pulmonary fibrosis) ([place])  J84.112   2. Diarrhea due to drug  K52.1   3. Weight loss, non-intentional  R63.4   4. Tachycardia  R00.0        Plan:     Patient Instructions     ICD-10-CM   1. IPF (idiopathic pulmonary fibrosis) ([place])  J84.112   2. Diarrhea due to drug  K52.1   3. Weight loss, non-intentional  R63.4   4. Tachycardia  R00.0      IPF (idiopathic pulmonary fibrosis) (H[facility]) Diarrhea due to drug   -Clinically appears to be stable -Last 2 months of taking 100 mg of nintedanib with the [place] version by [facility] called NINTIB -Liver function test in [place] reviewed and was normal.  Most recent liver function test in [place] by urology is normal per your report -New onset diarrhea after returning back from [place] and this is due to nintedanib  Plan -Continue [place] version of nintedanib -Take Imodium on a scheduled basis 2 or 3 times a week to control the diarrhea -Avoid sugar and sugar substitute and caffeine in order to control the diarrhea better -We will initiate paperwork for [place] nintedanib called OFEV -Do spirometry and DLCO in May 2022 -High-resolution CT chest in September 2022   Weight loss, non-intentional  -This started even before you started nintedanib.  Is still ongoing.  This could be because of IPF and nintedanib combined.  Do not know if other reasons are playing a role.  Regardless this is not a good sign.  Plan -We will continue to monitor -Continue to follow with primary care physician about this    Ttachycardia  -You have resting tachycardia and this could be because of pulmonary fibrosis but do not know if there are any cardiac  issues  Plan -Do 2D echocardiogram -and we will call you with the results -Also talk to primary care physician and you might need a cardiology referral -We might need to consider beta-blockers for this  Follow-up -Return in May 2022 for a 30-minute visit with Dr. Chase Caller but after completing breathing test     SIGNATURE    Dr. Brand Males, M.D., F.C.C.P,  Pulmonary and Critical Care Medicine Staff Physician, [place] Director - Interstitial Lung Disease  Program  [facility] [place] [facility] [place], [place], [address]  Pager: [phone], If no answer or between  15:00h - 7:00h: call [phone] Telephone: [phone]  2:42 PM 07/24/2020

## 2020-07-24 NOTE — Telephone Encounter (Signed)
Called and left detailed message on voicemail per DPR that [place] paperwork will be mailed out for patient to fill out and bring back to office. Paperwork placed in envelope and placed to be sent out.

## 2020-07-24 NOTE — Addendum Note (Signed)
Addended by: Geanie Logan on: 07/24/2020 05:51 PM   Modules accepted: Orders

## 2020-07-25 NOTE — Addendum Note (Signed)
Addended by: Merrilee Seashore on: 07/25/2020 07:58 AM   Modules accepted: Orders

## 2020-07-27 ENCOUNTER — Encounter: Payer: Self-pay | Admitting: Podiatry

## 2020-07-27 ENCOUNTER — Ambulatory Visit: Payer: Medicare Other | Admitting: Podiatry

## 2020-07-27 ENCOUNTER — Other Ambulatory Visit: Payer: Self-pay

## 2020-07-27 ENCOUNTER — Ambulatory Visit (INDEPENDENT_AMBULATORY_CARE_PROVIDER_SITE_OTHER): Payer: Medicare Other

## 2020-07-27 DIAGNOSIS — M4807 Spinal stenosis, lumbosacral region: Secondary | ICD-10-CM | POA: Insufficient documentation

## 2020-07-27 DIAGNOSIS — M19072 Primary osteoarthritis, left ankle and foot: Secondary | ICD-10-CM

## 2020-07-27 DIAGNOSIS — M722 Plantar fascial fibromatosis: Secondary | ICD-10-CM

## 2020-07-27 DIAGNOSIS — Z1211 Encounter for screening for malignant neoplasm of colon: Secondary | ICD-10-CM | POA: Insufficient documentation

## 2020-07-27 DIAGNOSIS — M19071 Primary osteoarthritis, right ankle and foot: Secondary | ICD-10-CM | POA: Diagnosis not present

## 2020-07-27 DIAGNOSIS — R682 Dry mouth, unspecified: Secondary | ICD-10-CM | POA: Insufficient documentation

## 2020-07-27 DIAGNOSIS — R1013 Epigastric pain: Secondary | ICD-10-CM | POA: Insufficient documentation

## 2020-07-27 DIAGNOSIS — K219 Gastro-esophageal reflux disease without esophagitis: Secondary | ICD-10-CM | POA: Insufficient documentation

## 2020-07-27 MED ORDER — TRIAMCINOLONE ACETONIDE 40 MG/ML IJ SUSP
40.0000 mg | Freq: Once | INTRAMUSCULAR | Status: AC
  Administered 2020-07-27: 40 mg

## 2020-07-27 NOTE — Progress Notes (Signed)
Subjective:  [name], male    DOB: 09-05-1940,  MRN: [id number] HPI Chief Complaint  Patient presents with  . Foot Pain    Plantar heel and plantar toes bilateral (R>L) - aching x several years, no AM pain, worse after standing a lot, sciatica right side, burning, sometimes sharp and sudden sensations, no treatment  . New Patient (Initial Visit)    80 y.o. male presents with the above complaint.   ROS: Denies fever chills nausea vomiting muscle aches pains calf pain back pain chest pain shortness of breath.  Past Medical History:  Diagnosis Date  . Back pain   . Elevated PSA   . Essential tremor   . Hypercholesteremia   . Neuropathy   . Prostate cancer ([place])   . Sinus tachycardia 01/30/2018   Past Surgical History:  Procedure Laterality Date  . LUMBAR MICRODISCECTOMY     L4-5  . RADIOACTIVE SEED IMPLANT N/A 02/24/2018   Procedure: RADIOACTIVE SEED IMPLANT/BRACHYTHERAPY IMPLANT;  Surgeon: Festus Aloe, MD;  Location: [facility];  Service: Urology;  Laterality: N/A;  . SPACE OAR INSTILLATION N/A 02/24/2018   Procedure: SPACE OAR INSTILLATION;  Surgeon: Festus Aloe, MD;  Location: [facility];  Service: Urology;  Laterality: N/A;    Current Outpatient Medications:  .  aspirin 81 MG chewable tablet, Chew by mouth daily., Disp: , Rfl:  .  famotidine (PEPCID) 40 MG tablet, 1 tab(s), Disp: , Rfl:  .  Cholecalciferol (VITAMIN D3 PO), Take 1 tablet by mouth every 30 (thirty) days., Disp: , Rfl:  .  gabapentin (NEURONTIN) 100 MG capsule, 2 pills four time a day, Disp: 240 capsule, Rfl: 2 .  Nintedanib (OFEV) 100 MG CAPS, Take 1 capsule (100 mg total) by mouth 2 (two) times daily., Disp: 180 capsule, Rfl: 3 .  vitamin B-12 (CYANOCOBALAMIN) 1000 MCG tablet, Take 1,000 mcg by mouth every Monday, Wednesday, and Friday. , Disp: , Rfl:   No Known Allergies Review of Systems Objective:  There were no vitals filed for this  visit.  General: Well developed, nourished, in no acute distress, alert and oriented x3   Dermatological: Skin is warm, dry and supple bilateral. Nails x 10 are well maintained; remaining integument appears unremarkable at this time. There are no open sores, no preulcerative lesions, no rash or signs of infection present.  Vascular: Dorsalis Pedis artery and Posterior Tibial artery pedal pulses are 2/4 bilateral with immedate capillary fill time. Pedal hair growth present. No varicosities and no lower extremity edema present bilateral.   Neruologic: Grossly intact via light touch bilateral. Vibratory intact via tuning fork bilateral. Protective threshold with Semmes Wienstein monofilament intact to all pedal sites bilateral. Patellar and Achilles deep tendon reflexes 2+ bilateral. No Babinski or clonus noted bilateral.   Musculoskeletal: No gross boney pedal deformities bilateral. No pain, crepitus, or limitation noted with foot and ankle range of motion bilateral. Muscular strength 5/5 in all groups tested bilateral.  Pain on palpation of the toes and range of motion of the toes at the PIPJ's and DIPJ's.  He also has pain on palpation medial calcaneal tubercle of the bilateral heels.  Gait: Unassisted, Nonantalgic.    Radiographs:  Radiographs taken today demonstrate small plantar distal aorticocaval heel spur soft tissue increase in is not 5 her screw cannula surgical site he does have some osteoarthritis to his PIPJ's and DIPJ's of the lesser digits.  Assessment & Plan:   Assessment: Plantar fasciitis primary diagnosis bilaterally.  Osteoarthritis of the lesser digits  Plan: Injected the bilateral heel today 20 mg Kenalog 5 mg Marcaine point maximal tenderness.  Discussed the use of Voltaren gel with him for his toes today he understands this is amenable to it we will follow-up with him on an as-needed basis.     [name], [place]

## 2020-08-01 ENCOUNTER — Telehealth: Payer: Self-pay | Admitting: Internal Medicine

## 2020-08-01 NOTE — Telephone Encounter (Signed)
Called and spoke with patient regarding scheduling for his echocardiogram. I advised patient that the referral is being worked on and we will give him a call once we get it scheduled. Patient voiced understanding.  Nothing further needed at this time.

## 2020-08-04 NOTE — Telephone Encounter (Signed)
Called patient to follow up on application, left message. 

## 2020-08-09 NOTE — Telephone Encounter (Signed)
Spoke with PT, verified that he HAS received paperwork and will be working on getting it completed and sent back to [place] within the next 1-2 weeks.

## 2020-08-09 NOTE — Telephone Encounter (Signed)
Routing back to correct Epic pool group

## 2020-08-18 ENCOUNTER — Other Ambulatory Visit: Payer: Self-pay | Admitting: Otolaryngology

## 2020-08-18 DIAGNOSIS — R1310 Dysphagia, unspecified: Secondary | ICD-10-CM

## 2020-08-22 NOTE — Telephone Encounter (Signed)
Submitted Patient Assistance Application to [facility] for OFEV along with provider portion. Will update patient when we receive a response.  Patient did not drop of income documents.  Fax# [phone]  Phone# [phone]

## 2020-08-23 NOTE — Telephone Encounter (Signed)
Just spoke to pt, he mailed out his income documents directly to [place] last night.

## 2020-08-29 NOTE — Telephone Encounter (Signed)
Called [employer], pt has been APPROVED for assistance until the end of this year and the first shipment has already been set up. Requested approval letter be refaxed to [place].

## 2020-09-07 ENCOUNTER — Ambulatory Visit (HOSPITAL_COMMUNITY): Payer: Medicare Other | Attending: Cardiology

## 2020-09-07 ENCOUNTER — Other Ambulatory Visit: Payer: Self-pay

## 2020-09-07 DIAGNOSIS — R000 Tachycardia, unspecified: Secondary | ICD-10-CM | POA: Insufficient documentation

## 2020-09-07 DIAGNOSIS — J84112 Idiopathic pulmonary fibrosis: Secondary | ICD-10-CM | POA: Diagnosis present

## 2020-09-07 DIAGNOSIS — I739 Peripheral vascular disease, unspecified: Secondary | ICD-10-CM | POA: Insufficient documentation

## 2020-09-07 DIAGNOSIS — I071 Rheumatic tricuspid insufficiency: Secondary | ICD-10-CM | POA: Insufficient documentation

## 2020-09-07 LAB — ECHOCARDIOGRAM COMPLETE
Area-P 1/2: 3.21 cm2
S' Lateral: 2.4 cm

## 2020-09-19 ENCOUNTER — Other Ambulatory Visit (HOSPITAL_COMMUNITY)
Admission: RE | Admit: 2020-09-19 | Discharge: 2020-09-19 | Disposition: A | Discharge status: Home / Self Care | Payer: Medicare Other | Source: Ambulatory Visit | Attending: Internal Medicine | Admitting: Internal Medicine

## 2020-09-19 DIAGNOSIS — Z01812 Encounter for preprocedural laboratory examination: Secondary | ICD-10-CM | POA: Insufficient documentation

## 2020-09-19 DIAGNOSIS — Z20822 Contact with and (suspected) exposure to covid-19: Secondary | ICD-10-CM | POA: Insufficient documentation

## 2020-09-19 LAB — SARS CORONAVIRUS 2 (TAT 6-24 HRS): SARS Coronavirus 2: NEGATIVE

## 2020-09-22 ENCOUNTER — Other Ambulatory Visit: Payer: Self-pay

## 2020-09-22 ENCOUNTER — Ambulatory Visit: Payer: Medicare Other | Admitting: Internal Medicine

## 2020-09-22 ENCOUNTER — Ambulatory Visit (INDEPENDENT_AMBULATORY_CARE_PROVIDER_SITE_OTHER): Payer: Medicare Other | Admitting: Internal Medicine

## 2020-09-22 ENCOUNTER — Encounter: Payer: Self-pay | Admitting: Internal Medicine

## 2020-09-22 VITALS — BP 126/82 | HR 109 | Temp 97.3°F | Ht 66.0 in | Wt 122.4 lb

## 2020-09-22 DIAGNOSIS — R634 Abnormal weight loss: Secondary | ICD-10-CM | POA: Diagnosis not present

## 2020-09-22 DIAGNOSIS — R000 Tachycardia, unspecified: Secondary | ICD-10-CM | POA: Diagnosis not present

## 2020-09-22 DIAGNOSIS — K521 Toxic gastroenteritis and colitis: Secondary | ICD-10-CM

## 2020-09-22 DIAGNOSIS — J84112 Idiopathic pulmonary fibrosis: Secondary | ICD-10-CM

## 2020-09-22 DIAGNOSIS — R059 Cough, unspecified: Secondary | ICD-10-CM

## 2020-09-22 LAB — PULMONARY FUNCTION TEST
DL/VA % pred: 89 %
DL/VA: 3.84 ml/min/mmHg/L
DLCO cor % pred: 62 %
DLCO cor: 16.91 ml/min/mmHg
DLCO unc % pred: 62 %
DLCO unc: 16.91 ml/min/mmHg
FEF 25-75 Pre: 3.71 L/sec
FEF2575-%Pred-Pre: 209 %
FEV1-%Pred-Pre: 102 %
FEV1-Pre: 2.45 L
FEV1FVC-%Pred-Pre: 114 %
FEV6-Pre: 2.78 L
FVC-%Pred-Pre: 89 %
FVC-Pre: 2.81 L
Pre FEV1/FVC ratio: 87 %
Pre FEV6/FVC Ratio: 99 %

## 2020-09-22 NOTE — Patient Instructions (Addendum)
   IPF (idiopathic pulmonary fibrosis) (HCC) Diarrhea due to drug   -Clinically disease is  Stable on breathing test and walk test and symptoms - Diarrhea +/- weight loss due to ofev  Plan -STOP OFEV - discuss esbriet at nex visit if weight loss staboilized but already cautioned that this too causes GI Side effects. Other option is [facility] IV infusion trial with [facility] Care Option  Weight loss, non-intentional  -This started even before you started nintedanib.  Is still ongoing and worse.  This could be because of IPF and nintedanib combined.  Do not know if other reasons are playing a role.  Regardless this is not a good sign.  Plan -STOP OFEV -Continue to follow with primary care physician about this  - telephone visit in July 2022 to reassess if stopping ofev has made a difference  COUGH   - likely due to IPF and cough neuropathy - noted you have seen ENT and having swallow evaluation  Plan  - if ENT eval is negative, can try empiric course of gabapentin   Ttachycardia  -You have resting tachycardia and this could be because of pulmonary fibrosis. ECHO  May 2022 is normal - unclear if this is contributing to weight loss in some fashion   Plan --Please talk to PCP Jeff Arabian, MD about t his  Follow-up -15 min telephone visit in July 2022 (4-6 weeks from now)

## 2020-09-22 NOTE — Progress Notes (Signed)
Spirometry and DLCO completed today  ?

## 2020-09-22 NOTE — Progress Notes (Signed)
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   Prior cough in  2019 for several months  resolved on on some kind of powder inhaler x one week = Advair     History of Present Illness  08/04/2019  Pulmonary/ 1st office eval/Jeff Willis  Chief Complaint  Patient presents with  . Pulmonary Consult    Referred by Dr Jeff Willis. Pt c/o non prod cough and throat clearing x 6 months.   Dyspnea:  None/ no aerobics, walking neighborhoods ok  Cough: always dry/ any candy helps if keeps in mouth but esp hard candy / does not use cough drops  Sleep: rotates on side, bed is flat/ one pillow, never wakes up cough but /win 15 min of stirring recurs daily  SABA use: never, also never prednisone Always trouble swallowing pills x lifetime [name] Reflux v Neurogenic Cough Differentiator Reflux Comments  Do you awaken from a sound sleep coughing violently?                            With trouble breathing? none   Do you have choking episodes when you cannot  Get enough air, gasping for air ?              none   Do you usually cough when you lie down into  The bed, or when you just lie down to rest ?                          No    Do you usually cough after meals or eating?         Yes after bfast   Do you cough when (or after) you bend over?    no   GERD SCORE     [name] Reflux v Neurogenic Cough Differentiator Neurogenic   Do you more-or-less cough all day long? sporadic   Does change  of temperature make you cough? None    Does laughing or chuckling cause you to cough? yes   Do fumes (perfume, automobile fumes, burned  Toast, etc.,) cause you to cough ?      Yes    Does speaking, singing, or talking on the phone cause you to cough   ?               Yes    Neurogenic/Airway score     rec Call Dr Jeff Willis office and ask what inhaler you received in 2019 as sample that seemed to help your cough = advair dpi Stop cetrizine, flonase  Prednisone 10 mg take  4 each am x 2 days,   2 each am x 2 days,  1 each am x 2 days and stop  Pantoprazole (protonix) 40 mg   Take  30-60 min before first  meal of the day and Pepcid (famotidine)  20 mg one after supper  until return to office  GERD   Gabapentin 100 mg four times daily (ok to stop when no coughing at all for a week)  Please schedule a follow up office visit in 4 weeks, sooner if needed  with all medications /inhalers/ solutions in hand so we can verify exactly what you are taking. This includes all medications from all doctors and over the counters    09/06/2019  f/u ov/Jeff Willis re: uacs on gabapenitn 100 mg three times  Jeff Willis had tried gabapentin but failed to reveal that at last ov when I recommended it  Chief Complaint  Patient presents with  . Follow-up    4 wk f/u. Wants to discuss switching Advair.   Dyspnea:  No  Cough: better just while on prednisone maybe 50-80%   Sleeping: no noct cough SABA use: none 02: none  rec Gabapentin 200 mg up to 4 x daily  Pantoprazole (protonix) 40 mg   Take  30-60 min before first meal of the day and Pepcid (famotidine)  20 mg one @  bedtime until return to office - this is the best way to tell whether stomach acid is contributing to your problem.   Keep the candy handy  Prednisone 10 mg  Take 4 for three days 3 for three days 2 for three days 1 for three days and stop  If not better in 2 weeks see Dr Jeff Willis for your reflux  Please remember to go to the lab department and xray dept  for  your tests - we will call you with the results when they are available.         OV 10/05/2019  Subjective:  [name], male , DOB: 04/02/1941 , age 80 y.o. , MRN: [id number] , ADDRESS: [place] [address]   10/05/2019 -   Chief Complaint  Patient presents with  . Follow-up    Pt states he has been doing okay since last visit and denies any complaints.   -80 year old [place] male.  Referred for interstitial lung disease.  He is accompanied by his son Jeff Willis who is a Software engineer.  I know Jeff  from a few years ago when heused to accompany his aunt; asthma patient of mine. Jeff Willis  has has been referred by Dr. Christinia Willis to the ILD center.  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  Up until the pandemic used to travel quite frequently.  He is to go to [place] for 3 months.  At none of the properties he is being exposed to mold.  He developed insidious onset of chronic cough over a year ago this has been persistent.  In the last few months several cough related treatments have been tried with some improvement.  Has been on gabapentin but this made him sleepy and is currently stopped it.  Because of chronic cough which he only attributes his constant clearing of the throat he had a high-resolution CT scan of the chest that I personally visualized and shows probable UIP pattern and therefore he has been referred here.  In my personal visualization it might even be definite of UIP because there might be some early honeycombing there.  He denies any shortness of breath.  At rest he was found to be tachycardic but he says this can at baseline for him.  He will follow this up with his primary care  physician.  [place] [facility]( ILD Questionnaire  Symptoms:   He denies any shortness of breath.  No episodic shortness of breath.  However on the questioning he does report mild shortness of breath climbing up stairs.  In  terms of his cough it started in June 2020.  It is the same since it started maybe in the last week it is slightly better.  Is moderate in intensity.  He does clear the throat.  Mostly dry cough occasionally white sputum.  Does feel a tickle in the back of his throat.  He is not waking up in the middle of the night.  Occasionally affects his voice.  No nausea no vomiting no diarrhea no wheezing.  SYMPTOM SCALE - ILD 10/05/2019   O2 use ra  Shortness of Breath 0 -> 5 scale with 5 being worst (score 6 If unable to do)  At rest 0  Simple tasks - showers, clothes change, eating, shaving 0  Household (dishes, doing bed, laundry) 0  Shopping 0  Walking level at own pace 0  Walking up Stairs 1  Total (30-36) Dyspnea Score 1  How bad is your cough? 2  How bad is your fatigue 0  How bad is nausea 0  How bad is vomiting?  00  How bad is diarrhea? 0  How bad is anxiety? 00  How bad is depression 0      Past Medical History : Denies any asthma COPD or heart failure rheumatoid arthritis or scleroderma or lupus or polymyositis or Sjogren's.  Denies sleep apnea.  Denies HIV denies pulmonary hypertension.  Denies diabetes or thyroid disease or stroke or seizures mononucleosis.  Denies any hepatitis or tuberculosis or kidney disease or pneumonia or blood clots or heart disease or pleurisy.  Does have on and off acid reflux for the last year   ROS: Positive for dry mouth for the last several months.  He also has some dysphagia for swallowing pills unclear for what duration.  He also has acid reflux on PPI and H2 blocker.  Denies any Raynaud's or recurrent fever or weight loss.  No nausea no vomiting.   FAMILY HISTORY of LUNG DISEASE: * -Denies family history of COPD or asthma sarcoid or cystic fibrosis of hypersensitive pneumonitis or autoimmune disease.   EXPOSURE HISTORY: Denies any Covid.  Denies smoking cigarettes.  Denies marijuana denies smoking.  Denies IV drug use denies cocaine   HOME and  HOBBY DETAILS : Single-family home [place] for the last 10 years.  Age of the home is 15 years.  No dampness.  No mildew.  No humidifier use.  No CPAP use no nebulizer machine use.  He does use a steam iron but there is no mold or mildew in it.  There is no [place] inside the house no pet birds or parakeets.  No pet gerbils no feather pillows no mold in the [facility] duct no music habits.  He does do some gardening very small garden in the house limited gardening mostly just water the garden.  No bird feather exposure no flood damage no strong mats no hot tub or Jacuzzi.  No exposure to animals at work.   OCCUPATIONAL HISTORY (122 questions) :   -Organic antigen exposure is negative inorganic exposure is negative   PULMONARY TOXICITY HISTORY (27 items):  -18-day prednisone course that ended last week.  He did have seen implantation for prostate cancer in October 2019 these were radiation seeds.  Results for GAMBLE, ENDERLE (MRN [id number]) as of 11/15/2019 16:27  Ref. Range 09/07/2019 11:29 10/05/2019 11:25  Anti Nuclear Antibody (ANA) Latest Ref Range: NEGATIVE   POSITIVE (A)  ANA Pattern 1 Unknown  Nuclear, Speckled (A)  ANA Titer 1 Latest Units: titer  1:40 (H)  Angiotensin-Converting Enzyme Latest Ref Range: 9 - 67 U/L  34  Cyclic Citrullin Peptide Ab Latest Units: UNITS  <16  ds DNA Ab Latest Units: IU/mL  <1  Myeloperoxidase Abs Latest Units: AI  <1.0  Serine Protease 3 Latest Units: AI  <1.0  RA Latex Turbid. Latest Ref Range: <14 IU/mL  <14  IgE (Immunoglobulin E), Serum Latest Ref Range: <OR=114 kU/L 36   SSA (Ro) (ENA) Antibody, IgG Latest Ref Range: <1.0 NEG AI  <1.0 NEG  SSB (La) (ENA) Antibody, IgG Latest Ref Range: <1.0 NEG AI  <1.0 NEG  Scleroderma (Scl-70) (ENA) Antibody, IgG Latest Ref Range: <1.0 NEG AI  <1.0 NEG     Simple office walk 185 feet x  3 laps goal with forehead probe 10/05/2019   O2 used ra  Number laps completed 3  Comments about pace avt   Resting Pulse Ox/HR 100% and 122/min  Final Pulse Ox/HR 100% and 143/min  Desaturated </= 88% x  Desaturated <= 3% points x  Got Tachycardic >/= 90/min x  Symptoms at end of test x  Miscellaneous comments x    Results for GANON, DEMASI (MRN [id number]) as of 10/05/2019 10:40  Ref. Range 09/07/2019 11:29  Eosinophils Absolute Latest Ref Range: 0.0 - 0.7 K/uL 0.1     High-resolution CT chest Sep 15, 2019: Personally visualized and agree with the findings below IMPRESSION: 1. Spectrum of findings compatible with basilar predominant fibrotic interstitial lung disease with suggestion of early honeycombing. 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 [place], Iss 5, [phone], Dec 28 2016. 2. Ectatic 4.4 cm ascending thoracic aorta. Recommend annual imaging followup by CTA or MRA. This recommendation follows 2010 ACCF/AHA/AATS/ACR/ASA/SCA/SCAI/SIR/STS/SVM Guidelines for the Diagnosis and Management of Patients with Thoracic Aortic Disease. Circulation. 2010; 121: S568-L275. Aortic aneurysm NOS (ICD10-I71.9). 3. One vessel coronary atherosclerosis. 4. Aortic Atherosclerosis (ICD10-I70.0).   Electronically Signed   By: Ilona Sorrel M.D.   On: 09/15/2019 14:26    Results for TALBOT, MONARCH (MRN [id number]) as of 10/05/2019 10:40  Ref. Range 09/07/2019 11:29  Sheep Sorrel IgE Latest Units: kU/L <0.10  Pecan/Hickory Tree IgE Latest Units: kU/L <0.10  IgE (Immunoglobulin E), Serum Latest Ref Range: <OR=114 kU/L 36  Allergen, D pternoyssinus,d7 Latest Units: kU/L <0.10  Cat Dander Latest Units: kU/L <0.10  Dog Dander Latest Units: kU/L <0.10  [place] Grass Latest Units: kU/L <0.10  Johnson Grass Latest Units: kU/L <0.10  Timothy Grass Latest Units: kU/L <0.10  Cockroach Latest Units: kU/L <0.10  Aspergillus fumigatus,  m3 Latest Units: kU/L <0.10  Allergen, Comm Silver Wendee Copp, t9 Latest Units: kU/L <0.10  Allergen, Cottonwood, t14 Latest Units: kU/L <0.10  Elm IgE Latest Units: kU/L <0.10  Allergen, Mulberry, t76 Latest Units: kU/L <0.10  Allergen, Oak,t7 Latest Units: kU/L <0.10  COMMON RAGWEED (SHORT) (W1) IGE Latest Units: kU/L 0.15 (H)  Allergen, Mouse Urine Protein, e78 Latest Units: kU/L <0.10  D. farinae Latest Units: kU/L <0.10  Allergen, Cedar tree, t12 Latest Units: kU/L <0.10  Box Elder IgE Latest Units: kU/L <0.10  Rough Pigweed  IgE Latest Units: kU/L <0.10    ROS - per HPI   OV 11/15/2019  Subjective:  [name], male , DOB: [birth year] , age 85 y.o. , MRN: [id number] , ADDRESS: [place] [address]   11/15/2019 -   Chief Complaint  Patient presents with  . Follow-up    Pt states the cough has been better since last visit.      HPI [place] 80 y.o. -presents with his son Jeff Willis for follow-up.  He is here to discuss test results.  In the interim he called for worsening cough.  We gave him prednisone.  He tells me the prednisone actually made the cough worse particularly towards the end.  After that the son  doubled up his PPI and also added H2 blockade and the cough went away completely.  Patient feels completely normal that he is possible that he has interstitial lung disease.  We reviewed the scan and I visualized it with him together and his son.  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  They wanted to discuss the implications of the scan in the setting of controlled acid reflux and controlled cough but he is largely asymptomatic at this point.  Noted his serologies were negative except for trace ANA and his overnight oxygen study was normal.     OV 01/17/2020   Subjective:  [name], male , DOB: February 21, 1941, age 27 y.o. years. , MRN: [id number],  ADDRESS: [place] [address] PCP  Gaynelle Arabian,  MD Providers : Treatment Team:  Attending Provider: Brand Males, MD   Chief Complaint  Patient presents with  . Follow-up    pt is here to go over ct and pft    Follow-up interstitial lung disease Follow-up associated cough partially controlled with acid reflux treatment Associated weight loss present   HPI Jeff Willis 80 y.o. -presents with his son Jeff Willis.  In the interim he is continue to lose weight.  He has lost another 3-4 pounds since June 2021.  His clothes are much looser.  He thinks he has been steadily losing weight.  His respiratory symptoms are not any worse although he says that his cough that seem to initially improve with doubling up of acid reflux therapy seems to persist.  At last visit in September 2020 when I thought the cough resolved but they tell me the cough only partially improved.  Nevertheless it is persisting and they are worried about it.  In addition he is worried about weight loss.  He also has poor appetite.  He is not in any pulmonary fibrosis antifibrotic's.  There is no diarrhea.  Symptom score suggest some element of anxiety since his last visit.  He has had pulmonary function test and it shows his FVC is actually better but his DLCO is actually worse.  He had high-resolution CT chest that I visualized and reviewed with him.  It shows probable UIP with possible emerging honeycombing.  I agree with the radiologist.  The radiologist actually feels that it might be a little bit worse compared to few months ago.  Of note: He has upcoming trip to [place] between March 05, 2020 and February 2022.  He wants to make sure his care is coordinated.  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 Evaluate for pulmonary  fibrosis.  EXAM: CT CHEST WITHOUT CONTRAST  TECHNIQUE: Multidetector CT imaging of the chest was  performed following the standard protocol without intravenous contrast. High resolution imaging of the lungs, as well as inspiratory and expiratory imaging, was performed.  COMPARISON:  Chest CT 09/15/2019.  FINDINGS: Cardiovascular: Heart size is normal. There is no significant pericardial fluid, thickening or pericardial calcification. There is aortic atherosclerosis, as well as atherosclerosis of the great vessels of the mediastinum and the coronary arteries, including calcified atherosclerotic plaque in the left main, left anterior descending and right coronary arteries. Dilatation of the ascending thoracic aorta measuring 4.5 cm in diameter.  Mediastinum/Nodes: No pathologically enlarged mediastinal or hilar lymph nodes. Please note that accurate exclusion of hilar adenopathy is limited on noncontrast CT scans. Esophagus is unremarkable in appearance. No axillary lymphadenopathy.  Lungs/Pleura: High-resolution images demonstrate widespread areas of ground-glass attenuation, septal thickening, thickening of the peribronchovascular interstitium, mild cylindrical bronchiectasis and peripheral bronchiolectasis. There is a suggestion of, but not definitive evidence of, early honeycombing in portions of the lungs. These findings do have a definitive craniocaudal gradient and appear slightly progressive compared to the prior study. Inspiratory and expiratory imaging is unremarkable. No acute consolidative airspace disease. No pleural effusions. No definite suspicious appearing pulmonary nodules or masses are noted.  Upper Abdomen: Cortical calcification in the upper pole of the right kidney incidentally noted.  Musculoskeletal: There are no aggressive appearing lytic or blastic lesions noted in the visualized portions of the skeleton.  IMPRESSION: 1. The appearance of the lungs is compatible with progressive interstitial lung disease categorized as probable usual  interstitial pneumonia (UIP) per current ATS guidelines. 2. Aortic atherosclerosis, in addition to left main and 2 vessel coronary artery disease. In addition, there is mild aneurysmal dilatation of the ascending thoracic aorta (4.5 cm in diameter). Ascending thoracic aortic aneurysm. Recommend semi-annual imaging followup by CTA or MRA and referral to cardiothoracic surgery if not already obtained. This recommendation follows 2010 ACCF/AHA/AATS/ACR/ASA/SCA/SCAI/SIR/STS/SVM Guidelines for the Diagnosis and Management of Patients With Thoracic Aortic Disease. Circulation. 2010; 121: U932-T557. Aortic aneurysm NOS (ICD10-I71.9).  Aortic Atherosclerosis (ICD10-I70.0). Aortic aneurysm NOS (ICD10-I71.9).   Electronically Signed   By: Vinnie Langton M.D.   On: 01/13/2020 15:15   ROS - per HPI    OV 03/01/2020  Subjective:  [name], male , DOB: 01/10/1941 , age 57 y.o. , MRN: [id number] , ADDRESS: [place] [address] PCP Gaynelle Arabian, MD Patient Care Team: Gaynelle Arabian, MD as PCP - General (Family Medicine)  This Provider for this visit: Treatment Team:  Attending Provider: Brand Males, MD  FU IPF Associated weight loss + pre-ofev   03/01/2020 -   Chief Complaint  Patient presents with  . Follow-up    ILD, doing ok, cough has not improved     HPI Jeff Willis 79 y.o. -returns for follow-up.  This is after starting nintedanib.  He is currently on 100 mg twice daily for 2 weeks.  His prescription is approved on February 02, 2020.  He accidentally got 150 mg twice daily but he sent this back.  He is going to send [place] back.  He has upcoming travel to [place] later this week he will be gone there for till February 2022.  He has supply and refill of his nintedanib on 06 March 2020.  After that he does not have nintedanib.  He and his son report that somebody will be able to go to [place] and  deliver it to him.  They  do not want to trust the courier service to take it there the supply chain issues.  At this point time from a respiratory standpoint he has minimal dyspnea.  He still has a cough.  The cough is not worse but after reducing the PPI dose the cough has come back.  It is somewhat troublesome.  The severity score is listed below.  They report the GI Dr. Collene Willis has reported concern about taking very high-dose of PPI and H2 blockade which actually helped the cough.  Is because of osteoporosis risk.  I agreed with that concern.  However patient is also reporting significant symptom burden.  Primary care has suggested that they follow the symptom control and quality of life issues.  I did support the primary care viewpoint as well.  Did explain to them at the end of the day as a risk-benefit ratio and with an advanced disease like pulmonary fibrosis 1 might have to consider taking a short-term again with cough control at the expense of long-term risk of side effects.  They are going to think about this.  In terms of his travel to [place].  He put me in touch with Dr. Posey Pronto.  I texted him and establish contact.  He will follow with Dr. Posey Pronto who is a pulmonologist trained in [place] but now living in [place].  Gave other travel advice are listed below.  In terms of weight loss: This is ongoing.  Even after starting the Ofev is not having any GI side effects but his weight loss continues.  He had a CT abdomen that is not showing any reason for weight loss.  Apparently had some hematuria had cystoscopy with Dr. Junious Silk.  He has some radiation seed implants.  There is no bladder cancer.  The really frustrated by this.  I did pull out an article about IPF and weight loss for the son to read.- shows IPF weight loss is asssocited with worse progrnosis   Wt Readings from Last 3 Encounters:  03/01/20 131 lb 6.4 oz (59.6 kg)  01/17/20 134 lb 12.8 oz (61.1 kg)  11/15/19 138 lb (62.6 kg)     SYMPTOM SCALE - ILD 10/05/2019 137 pounds  01/17/2020 134 pounds 03/01/2020 131#  O2 use ra ra ra  Shortness of Breath 0 -> 5 scale with 5 being worst (score 6 If unable to do)    At rest 0 0 0  Simple tasks - showers, clothes change, eating, shaving 0 0 0  Household (dishes, doing bed, laundry) 0 1 1  Shopping 0 0 0  Walking level at own pace 0 0 0  Walking up Stairs 1 0 1  Total (30-36) Dyspnea Score 1 1 2   How bad is your cough? 2 3 0  How bad is your fatigue 0 0 0  How bad is nausea 0 0 0  How bad is vomiting?  00 0 0  How bad is diarrhea? 0 0 0  How bad is anxiety? 00 1 1  How bad is depression 0 1 1       Simple office walk 185 feet x  3 laps goal with forehead probe 10/05/2019  03/01/2020   O2 used ra ra  Number laps completed 3   Comments about pace avt   Resting Pulse Ox/HR 100% and 122/min 98% and 110  Final Pulse Ox/HR 100% and 143/min 97% and 130  Desaturated </= 88% x x  Desaturated <= 3%  points x x  Got Tachycardic >/= 90/min x x  Symptoms at end of test x x  Miscellaneous comments x x   PFT Results Latest Ref Rng & Units 01/17/2020 10/06/2019  FVC-Pre L - 2.59  FVC-Predicted Pre % 83 78  Pre FEV1/FVC % % 86 86  FEV1-Pre L 2.37 2.24  FEV1-Predicted Pre % 95 89  DLCO uncorrected ml/min/mmHg 13.79 15.70  DLCO UNC% % 48 55  DLCO corrected ml/min/mmHg 13.79 15.70  DLCO COR %Predicted % 48 55  DLVA Predicted % 31 [place], male    DOB: 1941-02-01,    MRN: [id number]   Brief patient profile:  80 yo [place] male  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   Prior cough in   2019 for several months  resolved on on some kind of powder inhaler x one week = Advair     History of Present Illness  08/04/2019  Pulmonary/ 1st office eval/Jeff Willis  Chief Complaint  Patient presents with  . Pulmonary Consult    Referred by Dr Jeff Willis. Pt c/o non prod cough and throat clearing x 6 months.   Dyspnea:  None/ no aerobics, walking neighborhoods ok  Cough: always dry/ any candy helps if keeps in mouth but esp hard candy / does not use cough drops  Sleep: rotates on side, bed is flat/ one pillow, never wakes up cough but /win 15 min of stirring recurs daily  SABA use: never, also never prednisone Call Dr Jeff Willis office and ask what inhaler you received in 2019 as sample that seemed to help your cough = advair dpi Stop cetrizine, flonase  Prednisone 10 mg take  4 each am x 2 days,   2 each am x 2 days,  1 each am x 2 days and stop  Pantoprazole (protonix) 40 mg   Take  30-60 min before first meal of the day and Pepcid (famotidine)  20 mg one after supper  until return to office  GERD   Gabapentin 100 mg four times daily (ok to stop when no coughing at all for a week)  Please schedule a follow up office visit in 4 weeks, sooner if needed  with all medications /inhalers/ solutions in hand so we can verify exactly what you are taking. This includes all medications from all doctors and over the counters    09/06/2019  f/u ov/Jeff Willis re: uacs on gabapenitn 100 mg three times  Jeff Willis had tried gabapentin but failed to reveal that at last ov when I recommended it  Chief Complaint  Patient presents with  . Follow-up    4 wk f/u. Wants to discuss switching Advair.   Dyspnea:  No  Cough: better just while on prednisone maybe 50-80%   Sleeping: no noct cough SABA use: none 02: none  rec Gabapentin 200 mg up to 4 x daily  Pantoprazole (protonix) 40 mg   Take  30-60 min before first meal of the day and Pepcid (famotidine)  20  mg one @  bedtime until return to office - this is the best way  to tell whether stomach acid is contributing to your problem.   Keep the candy handy  Prednisone 10 mg  Take 4 for three days 3 for three days 2 for three days 1 for three days and stop  If not better in 2 weeks see Dr Jeff Willis for your reflux  Please remember to go to the lab department and xray dept  for your tests - we will call you with the results when they are available.         OV 10/05/2019  Subjective:  [name], male , DOB: [birth year] , age 89 y.o. , MRN: [id number] , ADDRESS: [place] [address]   10/05/2019 -   Chief Complaint  Patient presents with  . Follow-up    Pt states he has been doing okay since last visit and denies any complaints.   -80 year old [place] male.  Referred for interstitial lung disease.  He is accompanied by his son Jeff Willis who is a Software engineer.  I know Jeff  from a few years ago when heused to accompany his aunt; asthma patient of mine. Jeff Willis  has has been referred by Dr. Christinia Willis to the ILD center.  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  Up until the pandemic used to travel quite frequently.  He is to go to [place] for 3 months.  At none of the properties he is being exposed to mold.  He developed insidious onset of chronic cough over a year ago this has been persistent.  In the last few months several cough related treatments have been tried with some improvement.  Has been on gabapentin but this made him sleepy and is currently stopped it.  Because of chronic cough which he only attributes his constant clearing of the throat he had a high-resolution CT scan of the chest that I personally visualized and shows probable UIP pattern and therefore he has been referred here.  In my personal visualization it might even be definite of UIP because there might be some early honeycombing there.  He denies any shortness of breath.  At rest he was found to be tachycardic but he says this can at  baseline for him.  He will follow this up with his primary care physician.  [facility] Comprehensive( ILD Questionnaire  Symptoms:   He denies any shortness of breath.  No episodic shortness of breath.  However on the questioning he does report mild shortness of breath climbing up stairs.  In terms of his cough it started in June 2020.  It is the same since it started maybe in the last week it is slightly better.  Is moderate in intensity.  He does clear the throat.  Mostly dry cough occasionally white sputum.  Does feel a tickle in the back of his throat.  He is not waking up in the middle of the night.  Occasionally affects his voice.  No nausea no vomiting no diarrhea no wheezing.  SYMPTOM SCALE - ILD 10/05/2019   O2 use ra  Shortness of Breath 0 -> 5 scale with 5 being worst (score 6 If unable to do)  At rest 0  Simple tasks - showers, clothes change, eating, shaving 0  Household (dishes, doing bed, laundry) 0  Shopping 0  Walking level at own pace 0  Walking up Stairs 1  Total (30-36) Dyspnea  Score 1  How bad is your cough? 2  How bad is your fatigue 0  How bad is nausea 0  How bad is vomiting?  00  How bad is diarrhea? 0  How bad is anxiety? 00  How bad is depression 0      Past Medical History : Denies any asthma COPD or heart failure rheumatoid arthritis or scleroderma or lupus or polymyositis or Sjogren's.  Denies sleep apnea.  Denies HIV denies pulmonary hypertension.  Denies diabetes or thyroid disease or stroke or seizures mononucleosis.  Denies any hepatitis or tuberculosis or kidney disease or pneumonia or blood clots or heart disease or pleurisy.  Does have on and off acid reflux for the last year   ROS: Positive for dry mouth for the last several months.  He also has some dysphagia for swallowing pills unclear for what duration.  He also has acid reflux on PPI and H2 blocker.  Denies any Raynaud's or recurrent fever or weight loss.  No nausea no  vomiting.   FAMILY HISTORY of LUNG DISEASE: * -Denies family history of COPD or asthma sarcoid or cystic fibrosis of hypersensitive pneumonitis or autoimmune disease.   EXPOSURE HISTORY: Denies any Covid.  Denies smoking cigarettes.  Denies marijuana denies smoking.  Denies IV drug use denies cocaine   HOME and HOBBY DETAILS : Single-family [place] for the last 10 years.  Age of the home is 15 years.  No dampness.  No mildew.  No humidifier use.  No CPAP use no nebulizer machine use.  He does use a steam iron but there is no mold or mildew in it.  There is no [place] inside the house no pet birds or parakeets.  No pet gerbils no feather pillows no mold in the [facility] duct no music habits.  He does do some gardening very small garden in the house limited gardening mostly just water the garden.  No bird feather exposure no flood damage no strong mats no hot tub or Jacuzzi.  No exposure to animals at work.   OCCUPATIONAL HISTORY (122 questions) :   -Organic antigen exposure is negative inorganic exposure is negative   PULMONARY TOXICITY HISTORY (27 items):  -18-day prednisone course that ended last week.  He did have seen implantation for prostate cancer in October 2019 these were radiation seeds.      Results for JAYLEND, REILAND (MRN [id number]) as of 11/15/2019 16:27  Ref. Range 09/07/2019 11:29 10/05/2019 11:25  Anti Nuclear Antibody (ANA) Latest Ref Range: NEGATIVE   POSITIVE (A)  ANA Pattern 1 Unknown  Nuclear, Speckled (A)  ANA Titer 1 Latest Units: titer  1:40 (H)  Angiotensin-Converting Enzyme Latest Ref Range: 9 - 67 U/L  34  Cyclic Citrullin Peptide Ab Latest Units: UNITS  <16  ds DNA Ab Latest Units: IU/mL  <1  Myeloperoxidase Abs Latest Units: AI  <1.0  Serine Protease 3 Latest Units: AI  <1.0  RA Latex Turbid. Latest Ref Range: <14 IU/mL  <14  IgE (Immunoglobulin E), Serum Latest Ref Range: <OR=114 kU/L 36   SSA (Ro) (ENA) Antibody, IgG Latest Ref Range:  <1.0 NEG AI  <1.0 NEG  SSB (La) (ENA) Antibody, IgG Latest Ref Range: <1.0 NEG AI  <1.0 NEG  Scleroderma (Scl-70) (ENA) Antibody, IgG Latest Ref Range: <1.0 NEG AI  <1.0 NEG     Simple office walk 185 feet x  3 laps goal with forehead probe 10/05/2019   O2 used ra  Number laps completed 3  Comments about pace avt  Resting Pulse Ox/HR 100% and 122/min  Final Pulse Ox/HR 100% and 143/min  Desaturated </= 88% x  Desaturated <= 3% points x  Got Tachycardic >/= 90/min x  Symptoms at end of test x  Miscellaneous comments x    Results for JEANPIERRE, THEBEAU (MRN [id number]) as of 10/05/2019 10:40  Ref. Range 09/07/2019 11:29  Eosinophils Absolute Latest Ref Range: 0.0 - 0.7 K/uL 0.1     High-resolution CT chest Sep 15, 2019: Personally visualized and agree with the findings below IMPRESSION: 1. Spectrum of findings compatible with basilar predominant fibrotic interstitial lung disease with suggestion of early honeycombing. 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 [place], Iss 5, [phone], Dec 28 2016. 2. Ectatic 4.4 cm ascending thoracic aorta. Recommend annual imaging followup by CTA or MRA. This recommendation follows 2010 ACCF/AHA/AATS/ACR/ASA/SCA/SCAI/SIR/STS/SVM Guidelines for the Diagnosis and Management of Patients with Thoracic Aortic Disease. Circulation. 2010; 121: J188-C166. Aortic aneurysm NOS (ICD10-I71.9). 3. One vessel coronary atherosclerosis. 4. Aortic Atherosclerosis (ICD10-I70.0).   Electronically Signed   By: Ilona Sorrel M.D.   On: 09/15/2019 14:26    Results for AVANEESH, PEPITONE (MRN [id number]) as of 10/05/2019 10:40  Ref. Range 09/07/2019 11:29  Sheep Sorrel IgE Latest Units: kU/L <0.10  Pecan/Hickory Tree IgE Latest Units: kU/L <0.10  IgE (Immunoglobulin E), Serum Latest Ref Range: <OR=114  kU/L 36  Allergen, D pternoyssinus,d7 Latest Units: kU/L <0.10  Cat Dander Latest Units: kU/L <0.10  Dog Dander Latest Units: kU/L <0.10  [place] Grass Latest Units: kU/L <0.10  Johnson Grass Latest Units: kU/L <0.10  Timothy Grass Latest Units: kU/L <0.10  Cockroach Latest Units: kU/L <0.10  Aspergillus fumigatus, m3 Latest Units: kU/L <0.10  Allergen, Comm Silver Wendee Copp, t9 Latest Units: kU/L <0.10  Allergen, Cottonwood, t14 Latest Units: kU/L <0.10  Elm IgE Latest Units: kU/L <0.10  Allergen, Mulberry, t76 Latest Units: kU/L <0.10  Allergen, Oak,t7 Latest Units: kU/L <0.10  COMMON RAGWEED (SHORT) (W1) IGE Latest Units: kU/L 0.15 (H)  Allergen, Mouse Urine Protein, e78 Latest Units: kU/L <0.10  D. farinae Latest Units: kU/L <0.10  Allergen, Cedar tree, t12 Latest Units: kU/L <0.10  Box Elder IgE Latest Units: kU/L <0.10  Rough Pigweed  IgE Latest Units: kU/L <0.10    ROS - per HPI   OV 11/15/2019  Subjective:  [name], male , DOB: July 27, 1940 , age 52 y.o. , MRN: [id number] , ADDRESS: [place] [address]   11/15/2019 -   Chief Complaint  Patient presents with  . Follow-up    Pt states the cough has been better since last visit.      HPI Strawberry 80 y.o. -presents with his son Jeff Willis for follow-up.  He is here to discuss test results.  In the interim he called for worsening cough.  We gave him prednisone.  He tells me the prednisone actually made the cough worse particularly towards the end.  After that the son  doubled up his PPI and also added H2 blockade and the cough went away completely.  Patient feels completely normal that he is possible that he has interstitial lung disease.  We reviewed the scan and I visualized it with him together and his son.  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  They wanted to discuss the implications of the scan  in the setting of controlled acid reflux and controlled cough but he is largely asymptomatic at this  point.  Noted his serologies were negative except for trace ANA and his overnight oxygen study was normal.     OV 01/17/2020   Subjective:  [name], male , DOB: 1940/11/18, age 51 y.o. years. , MRN: [id number],  ADDRESS: [place] [address] PCP  Gaynelle Arabian, MD Providers : Treatment Team:  Attending Provider: Brand Males, MD   Chief Complaint  Patient presents with  . Follow-up    pt is here to go over ct and pft    Follow-up interstitial lung disease Follow-up associated cough partially controlled with acid reflux treatment Associated weight loss present   HPI Jeff Willis 80 y.o. -presents with his son Jeff Willis.  In the interim he is continue to lose weight.  He has lost another 3-4 pounds since June 2021.  His clothes are much looser.  He thinks he has been steadily losing weight.  His respiratory symptoms are not any worse although he says that his cough that seem to initially improve with doubling up of acid reflux therapy seems to persist.  At last visit in September 2020 when I thought the cough resolved but they tell me the cough only partially improved.  Nevertheless it is persisting and they are worried about it.  In addition he is worried about weight loss.  He also has poor appetite.  He is not in any pulmonary fibrosis antifibrotic's.  There is no diarrhea.  Symptom score suggest some element of anxiety since his last visit.  He has had pulmonary function test and it shows his FVC is actually better but his DLCO is actually worse.  He had high-resolution CT chest that I visualized and reviewed with him.  It shows probable UIP with possible emerging honeycombing.  I agree with the radiologist.  The radiologist actually feels that it might be a little bit worse compared to few months ago.  Of note: He has upcoming trip to [place] between March 05, 2020 and February 2022.  He wants to make sure his care is  coordinated.  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 Evaluate for pulmonary fibrosis.  EXAM: CT CHEST WITHOUT CONTRAST  TECHNIQUE: Multidetector CT imaging of the chest was performed following the standard protocol without intravenous contrast. High resolution imaging of the lungs, as well as inspiratory and expiratory imaging, was performed.  COMPARISON:  Chest CT 09/15/2019.  FINDINGS: Cardiovascular: Heart size is normal. There is no significant pericardial fluid, thickening or pericardial calcification. There is aortic atherosclerosis, as well as atherosclerosis of the great vessels of the mediastinum and the coronary arteries, including calcified atherosclerotic plaque in the left main, left anterior descending and right coronary arteries. Dilatation of the ascending thoracic aorta measuring 4.5 cm in diameter.  Mediastinum/Nodes: No pathologically enlarged mediastinal or hilar lymph nodes. Please note that accurate exclusion of hilar adenopathy is limited on noncontrast CT scans. Esophagus is unremarkable in appearance. No axillary lymphadenopathy.  Lungs/Pleura: High-resolution images demonstrate widespread areas of ground-glass attenuation, septal thickening, thickening of the peribronchovascular interstitium, mild cylindrical bronchiectasis and peripheral bronchiolectasis. There is a suggestion of, but not definitive evidence of, early honeycombing in portions of the lungs. These findings do have a definitive craniocaudal gradient and appear slightly  progressive compared to the prior study. Inspiratory and expiratory imaging is unremarkable. No acute consolidative airspace disease. No pleural effusions. No definite suspicious appearing pulmonary nodules or masses are noted.  Upper Abdomen: Cortical calcification in the upper pole  of the right kidney incidentally noted.  Musculoskeletal: There are no aggressive appearing lytic or blastic lesions noted in the visualized portions of the skeleton.  IMPRESSION: 1. The appearance of the lungs is compatible with progressive interstitial lung disease categorized as probable usual interstitial pneumonia (UIP) per current ATS guidelines. 2. Aortic atherosclerosis, in addition to left main and 2 vessel coronary artery disease. In addition, there is mild aneurysmal dilatation of the ascending thoracic aorta (4.5 cm in diameter). Ascending thoracic aortic aneurysm. Recommend semi-annual imaging followup by CTA or MRA and referral to cardiothoracic surgery if not already obtained. This recommendation follows 2010 ACCF/AHA/AATS/ACR/ASA/SCA/SCAI/SIR/STS/SVM Guidelines for the Diagnosis and Management of Patients With Thoracic Aortic Disease. Circulation. 2010; 121: G992-E268. Aortic aneurysm NOS (ICD10-I71.9).  Aortic Atherosclerosis (ICD10-I70.0). Aortic aneurysm NOS (ICD10-I71.9).   Electronically Signed   By: Vinnie Langton M.D.   On: 01/13/2020 15:15   ROS - per HPI    OV 03/01/2020  Subjective:  [name], male , DOB: 1940-06-03 , age 89 y.o. , MRN: [id number] , ADDRESS: [place] [address] PCP Gaynelle Arabian, MD Patient Care Team: Gaynelle Arabian, MD as PCP - General (Family Medicine)  This Provider for this visit: Treatment Team:  Attending Provider: Brand Males, MD  FU IPF Associated weight loss + pre-ofev   03/01/2020 -   Chief Complaint  Patient presents with  . Follow-up    ILD, doing ok, cough has not improved     HPI Jeff Willis 80 y.o. -returns for follow-up.  This is after starting nintedanib.  He is currently on 100 mg twice daily for 2 weeks.  His prescription is approved on February 02, 2020.  He accidentally got 150 mg twice daily but he sent this back.  He is going to  send [place] back.  He has upcoming travel to [place] later this week he will be gone there for till February 2022.  He has supply and refill of his nintedanib on 06 March 2020.  After that he does not have nintedanib.  He and his son report that somebody will be able to go to [place] and deliver it to him.  They do not want to trust the courier service to take it there the supply chain issues.  At this point time from a respiratory standpoint he has minimal dyspnea.  He still has a cough.  The cough is not worse but after reducing the PPI dose the cough has come back.  It is somewhat troublesome.  The severity score is listed below.  They report the GI Dr. Collene Willis has reported concern about taking very high-dose of PPI and H2 blockade which actually helped the cough.  Is because of osteoporosis risk.  I agreed with that concern.  However patient is also reporting significant symptom burden.  Primary care has suggested that they follow the symptom control and quality of life issues.  I did support the primary care viewpoint as well.  Did explain to them at the end of the day as a risk-benefit ratio and with an advanced disease like pulmonary fibrosis 1 might have to consider taking a short-term again with cough control at the expense of long-term risk of side effects.  They are going to  think about this.  In terms of his travel to [place].  He put me in touch with Dr. Posey Pronto.  I texted him and establish contact.  He will follow with Dr. Posey Pronto who is a pulmonologist trained in [place] but now living in [place].  Gave other travel advice are listed below.  In terms of weight loss: This is ongoing.  Even after starting the Ofev is not having any GI side effects but his weight loss continues.  He had a CT abdomen that is not showing any reason for weight loss.  Apparently had some hematuria had cystoscopy with Dr. Junious Silk.  He has some radiation seed implants.  There is no bladder cancer.  The really frustrated by this.  I did pull  out an article about IPF and weight loss for the son to read.- shows IPF weight loss is asssocited with worse progrnosis    ROS - per HPI    OV 07/24/2020  Subjective:  [name], male , DOB: 1940-05-24 , age 55 y.o. , MRN: [id number] , ADDRESS: [address] [place] [address] PCP Gaynelle Arabian, MD Patient Care Team: Gaynelle Arabian, MD as PCP - General (Family Medicine)  This Provider for this visit: Treatment Team:  Attending Provider: Brand Males, MD    07/24/2020 -   Chief Complaint  Patient presents with  . Follow-up    4 mo f/u. States his breathing has been stable since last visit. Has noticed a decrease in his appetite. Increased diarrhea for the past week.    Idiopathic pulmonary fibrosis on nintedanib 100 mg twice daily -last CT September 2021 Weight loss unintentional even before nintedanib  HPI Jeff Willis 80 y.o. -returns for follow-up.  He was in [place] and returned back in early March 2022.  He is now here with his wife.  He tells me while he was in [place] for 4 months he had a great time.  He barely had any diarrhea.  Approximately 2 months ago which is 1 month before his return he switched to an [place] version of nintedanib called NINTIB.  He was taking it for a month in [place] and did not have any problems.  Upon returning to the [place] he is starting to have diarrhea.  The diarrhea is ongoing and is 3 out of 5.  Happens 2 or 3 times a week and controlled by Imodium.  His son who is a Software engineer thinks it is because he is in the [place] version of nintedanib.  He says he spoke to the [place] pulmonologist and was told that exponentially there is no difference in diarrhea between the [place] version of nintedanib and the [place] version of nintedanib.  Diarrhea happens randomly does bother him.  He says his diet has not changed since returning to the [place].  He says he eats the same [place].  He does take  sugar with tea in the morning.  In terms of his tachycardia it still is ongoing.  He is never had echocardiogram.  In terms of weight loss he continues to lose weight.  This weight loss started even before he was on nintedanib.  It is still ongoing.  He is lost 10 pounds since June 2021.  He is worried about this.  PFT  OV 09/22/2020  Subjective:  [name], male , DOB: [birth year] , age 24 y.o. , MRN: [id number] , ADDRESS: [place] [address] PCP Gaynelle Arabian,  MD Patient Care Team: Gaynelle Arabian, MD as PCP - General (Family Medicine)  This Provider for this visit: Treatment Team:  Attending Provider: Brand Males, MD    09/22/2020 -   Chief Complaint  Patient presents with  . Follow-up    2 mo f/u after PFT. States he is still struggling with diarrhea and losing weight. States his breathing has been stable since last visit.    Idiopathic pulmonary fibrosis on nintedanib 100 mg twice daily since Oct 2021   -last CT September 2021 Weight loss unintentional even before nintedanib Cough Tachycardia NOS  HPI Jeff Willis 80 y.o. -returns for follow-up of all the above medical issues.  From a respiratory standpoint he continues to be stable.  He had pulmonary function test that shows continued stability/improvement.  His cough continues.  He in fact he tells me that for the last 2 or 3 years mostly when he drinks water or eats food he starts choking and coughing and clears his throat.  He has now seen ENT and the dysphagia evaluation is set up.  He is continue to lose weight.  In fact he is lost 10 pounds of weight since starting nintedanib.  The weight loss was the even before nintedanib but has accelerated after nintedanib.  He has diarrhea with the nintedanib.  This is on low-dose nintedanib.  Currently is on the [place] version of the nintedanib but he still losing weight and having the same GI symptoms.  In terms of his  tachycardia he had echocardiogram and this is normal.  He says his wife is very concerned about his weight loss.  His pants are loose now.     SYMPTOM SCALE - ILD 10/05/2019 137 pounds 01/17/2020 134 pounds 03/01/2020 131# - start ofev oct 2021 07/24/2020 127# on ofev (nintib from [place] by [facility] x 2 months) 100mg  bid 09/22/2020 122# - on ofev 100mg  bid  O2 use ra ra ra ra ra  Shortness of Breath 0 -> 5 scale with 5 being worst (score 6 If unable to do)      At rest 0 0 0 0 0  Simple tasks - showers, clothes change, eating, shaving 0 0 0 1 0  Household (dishes, doing bed, laundry) 0 1 1  0 0  Shopping 0 0 0 0   Walking level at own pace 0 0 0 0 0  Walking up Stairs 1 0 1 0 0  Total (30-36) Dyspnea Score 1 1 2 1  0  How bad is your cough? 2 3 0 2 2  How bad is your fatigue 0 0 0 0 0  How bad is nausea 0 0 0 1 0  How bad is vomiting?  00 0 0 0 0  How bad is diarrhea? 0 0 0 3 3  How bad is anxiety? 00 1 1 1  0  How bad is depression 0 1 1 1  0       Simple office walk 185 feet x  3 laps goal with forehead probe 10/05/2019  03/01/2020  07/24/2020  09/22/2020   O2 used ra ra  ra  Number laps completed 3   3  Comments about pace avt   Mod paced  Resting Pulse Ox/HR 100% and 122/min 98% and 110 1-00% and 120 100% and 109/min  Final Pulse Ox/HR 100% and 143/min 97% and 130 99% and 130 99% and 124/min  Desaturated </= 88% x x  no  Desaturated <= 3% points x x  no  Got Tachycardic >/= 90/min x x  ye  Symptoms at end of test x x  none  Miscellaneous comments x x No dyspnea No dyspnea       PFT  PFT Results Latest Ref Rng & Units 09/22/2020 01/17/2020 10/06/2019  FVC-Pre L 2.81 - 2.59  FVC-Predicted Pre % 89 83 78  Pre FEV1/FVC % % 87 86 86  FEV1-Pre L 2.45 2.37 2.24  FEV1-Predicted Pre % 102 95 89  DLCO uncorrected ml/min/mmHg 16.91 13.79 15.70  DLCO UNC% % 62 48 55  DLCO corrected ml/min/mmHg 16.91 13.79 15.70  DLCO COR %Predicted % 62 48 55  DLVA Predicted % 89 81 97   ECHO MAY  2022   IMPRESSIONS    1. Left ventricular ejection fraction, by estimation, is 60 to 65%. The  left ventricle has normal function. The left ventricle has no regional  wall motion abnormalities. Left ventricular diastolic parameters are  indeterminate.  2. Right ventricular systolic function is normal. The right ventricular  size is normal. There is mildly elevated pulmonary artery systolic  pressure. The estimated right ventricular systolic pressure is 79.8 mmHg.  3. The mitral valve is normal in structure. No evidence of mitral valve  regurgitation.  4. Tricuspid valve regurgitation is mild to moderate.  5. The aortic valve is tricuspid. Aortic valve regurgitation is not  visualized. No aortic stenosis is present.  6. The inferior vena cava is normal in size with greater than 50%  respiratory variability, suggesting right atrial pressure of 3 mmHg.     has a past medical history of Back pain, Elevated PSA, Essential tremor, Hypercholesteremia, Neuropathy, Prostate cancer ([place]), and Sinus tachycardia (01/30/2018).   reports that he has never smoked. He has never used smokeless tobacco.  Past Surgical History:  Procedure Laterality Date  . LUMBAR MICRODISCECTOMY     L4-5  . RADIOACTIVE SEED IMPLANT N/A 02/24/2018   Procedure: RADIOACTIVE SEED IMPLANT/BRACHYTHERAPY IMPLANT;  Surgeon: Festus Aloe, MD;  Location: [facility];  Service: Urology;  Laterality: N/A;  . SPACE OAR INSTILLATION N/A 02/24/2018   Procedure: SPACE OAR INSTILLATION;  Surgeon: Festus Aloe, MD;  [facility];  Service: Urology;  Laterality: N/A;    No Known Allergies  Immunization History  Administered Date(s) Administered  . Influenza, High Dose Seasonal PF 01/28/2019  . Influenza-Unspecified 01/05/2020  . PFIZER(Purple Top)SARS-COV-2 Vaccination 07/23/2019, 08/17/2019  . Pneumococcal-Unspecified 02/28/2015    Family History  Problem Relation Age  of Onset  . Other Mother        died at 52 of natural causes  . Other Father        unsure of health  . Cancer Neg Hx      Current Outpatient Medications:  .  Cholecalciferol (VITAMIN D3 PO), Take 1 tablet by mouth every 30 (thirty) days., Disp: , Rfl:  .  famotidine (PEPCID) 40 MG tablet, 1 tab(s), Disp: , Rfl:  .  Nintedanib (OFEV) 100 MG CAPS, Take 1 capsule (100 mg total) by mouth 2 (two) times daily., Disp: 180 capsule, Rfl: 3 .  pantoprazole (PROTONIX) 40 MG tablet, Take 1 tablet by mouth daily., Disp: , Rfl:  .  vitamin B-12 (CYANOCOBALAMIN) 1000 MCG tablet, Take 1,000 mcg by mouth every Monday, Wednesday, and Friday. , Disp: , Rfl:       Objective:   Vitals:   09/22/20 1041  BP: 126/82  Pulse: (!) 109  Temp: (!) 97.3 F (36.3 C)  TempSrc:  Temporal  SpO2: 100%  Weight: 122 lb 6.4 oz (55.5 kg)  Height: 5\' 6"  (1.676 m)    Estimated body mass index is 19.76 kg/m as calculated from the following:   Height as of this encounter: 5\' 6"  (1.676 m).   Weight as of this encounter: 122 lb 6.4 oz (55.5 kg).  @WEIGHTCHANGE @  [employer]   09/22/20 1041  Weight: 122 lb 6.4 oz (55.5 kg)     Physical Exam   General: No distress. Looks well Neuro: Alert and Oriented x 3. GCS 15. Speech normal Psych: Pleasant Resp:  Barrel Chest - no.  Wheeze - no, Crackles - yes, No overt respiratory distress CVS: Normal heart sounds. Murmurs - no Ext: Stigmata of Connective Tissue Disease - no his  HEENT: Normal upper airway. PEERL +. No post nasal drip        Assessment:       ICD-10-CM   1. IPF (idiopathic pulmonary fibrosis) ([place])  J84.112   2. Weight loss, non-intentional  R63.4   3. Diarrhea due to drug  K52.1   4. Tachycardia  R00.0   5. Cough  R05.9        Plan:     Patient Instructions    IPF (idiopathic pulmonary fibrosis) ([facility]) Diarrhea due to drug   -Clinically disease is  Stable on breathing test and walk test and symptoms - Diarrhea +/- weight loss  due to ofev  Plan -STOP OFEV - discuss esbriet at nex visit if weight loss staboilized but already cautioned that this too causes GI Side effects. Other option is [facility]AB IV infusion trial with [facility] as a Care Option  Weight loss, non-intentional  -This started even before you started nintedanib.  Is still ongoing and worse.  This could be because of IPF and nintedanib combined.  Do not know if other reasons are playing a role.  Regardless this is not a good sign.  Plan -STOP OFEV -Continue to follow with primary care physician about this  - telephone visit in July 2022 to reassess if stopping ofev has made a difference  COUGH   - likely due to IPF and cough neuropathy - noted you have seen ENT and having swallow evaluation  Plan  - if ENT eval is negative, can try empiric course of gabapentin   Ttachycardia  -You have resting tachycardia and this could be because of pulmonary fibrosis. ECHO  May 2022 is normal - unclear if this is contributing to weight loss in some fashion   Plan --Please talk to PCP Gaynelle Arabian, MD about t his  Follow-up -15 min telephone visit in July 2022 (4-6 weeks from now)  ( Level 05 visit: Estb 40-54 min in  visit type: on-site physical face to visit  in total care time and counseling or/and coordination of care by this undersigned MD - Dr Brand Males. This includes one or more of the following on this same day 09/22/2020: pre-charting, chart review, note writing, documentation discussion of test results, diagnostic or treatment recommendations, prognosis, risks and benefits of management options, instructions, education, compliance or risk-factor reduction. It excludes time spent by the [place] or office staff in the care of the patient. Actual time41 min)    SIGNATURE    Dr. Brand Males, M.D., F.C.C.P,  Pulmonary and Critical Care Medicine Staff Physician, [place] Director - Interstitial Lung Disease  Program   [facility] [place] [facility] [place], [place], [address]  Pager: [phone], If no answer or between  15:00h - 7:00h: call [phone] Telephone: [phone]  11:24 AM 09/22/2020

## 2020-11-24 ENCOUNTER — Telehealth: Payer: Self-pay | Admitting: Internal Medicine

## 2020-11-24 NOTE — Telephone Encounter (Signed)
Call made to patient, confirmed DOB. Patient states since stopping the OFEV he has gained about 5lbs. He wanted to know if he needs to go back on the medication or if he needs to continue to hold it. He also wanted to know discuss further options with MR. Made aware MR is not in clinic today but we would get this message to him and get back with him. Voiced understanding. Patient is okay with delayed response.   MR please advise. Thanks.

## 2020-11-24 NOTE — Telephone Encounter (Signed)
Called and spoke with patient to let him know that Dr. Chase Caller said to stop the OFEV and to not go back on it. Patient is currently scheduled for 01/11/21 and to have a CT scan done. Advised patient to call if he needs anything before then. Nothing further needed at this time.

## 2020-11-24 NOTE — Telephone Encounter (Signed)
Pt stated that he has gained 5 lbs after quitting Ofev 100 MG, pt was just wanting to make sure if he wanted him to start any other medication. Pls regard; [phone]

## 2020-11-24 NOTE — Telephone Encounter (Signed)
Still stop ofev. No going back to ofev  That weight loss was abnormal Give appt to see me or an app to dsicuss esbriet   No Known Allergies

## 2020-11-29 ENCOUNTER — Ambulatory Visit: Payer: Medicare Other | Admitting: Cardiovascular Disease

## 2020-11-29 ENCOUNTER — Encounter: Payer: Self-pay | Admitting: Cardiovascular Disease

## 2020-11-29 ENCOUNTER — Other Ambulatory Visit: Payer: Self-pay

## 2020-11-29 DIAGNOSIS — K219 Gastro-esophageal reflux disease without esophagitis: Secondary | ICD-10-CM

## 2020-11-29 DIAGNOSIS — I712 Thoracic aortic aneurysm, without rupture: Secondary | ICD-10-CM | POA: Diagnosis not present

## 2020-11-29 DIAGNOSIS — I7121 Aneurysm of the ascending aorta, without rupture: Secondary | ICD-10-CM

## 2020-11-29 DIAGNOSIS — I2584 Coronary atherosclerosis due to calcified coronary lesion: Secondary | ICD-10-CM

## 2020-11-29 DIAGNOSIS — R053 Chronic cough: Secondary | ICD-10-CM

## 2020-11-29 DIAGNOSIS — E785 Hyperlipidemia, unspecified: Secondary | ICD-10-CM

## 2020-11-29 DIAGNOSIS — R000 Tachycardia, unspecified: Secondary | ICD-10-CM | POA: Diagnosis not present

## 2020-11-29 DIAGNOSIS — J84112 Idiopathic pulmonary fibrosis: Secondary | ICD-10-CM | POA: Diagnosis not present

## 2020-11-29 DIAGNOSIS — I2510 Atherosclerotic heart disease of native coronary artery without angina pectoris: Secondary | ICD-10-CM

## 2020-11-29 MED ORDER — METOPROLOL TARTRATE 100 MG PO TABS: 100.0000 mg | ORAL_TABLET | Freq: Once | ORAL | 0 refills | Status: DC

## 2020-11-29 MED ORDER — METOPROLOL SUCCINATE ER 25 MG PO TB24: 25.0000 mg | ORAL_TABLET | Freq: Every day | ORAL | 3 refills | Status: DC

## 2020-11-29 NOTE — Patient Instructions (Addendum)
Medication Instructions:  BEGIN metoprolol succinate (Toprol XL) '25mg'$  (1 tablet) daily.  TWO HOURS BEFORE YOUR CTA - Take metoprolol tartrate '100mg'$  (1 tablet). Take this INSTEAD of your metoprolol succinate (Toprol XL).   *If you need a refill on your cardiac medications before your next appointment, please call your pharmacy*   Lab Work: Tomorrow - FASTING CBC, CMET, Mg, Free T3, Free T4, Lipid.  If you have labs (blood work) drawn today and your tests are completely normal, you will receive your results only by: [place] (if you have MyChart) OR A paper copy in the mail If you have any lab test that is abnormal or we need to change your treatment, we will call you to review the results.   Testing/Procedures: Coronary CTA. See directions below.    Follow-Up: At [facility], you and your health needs are our priority.  As part of our continuing mission to provide you with exceptional heart care, we have created designated Provider Care Teams.  These Care Teams include your primary Cardiologist (physician) and Advanced Practice Providers (APPs -  Physician Assistants and Nurse Practitioners) who all work together to provide you with the care you need, when you need it.  We recommend signing up for the patient portal called "MyChart".  Sign up information is provided on this After Visit Summary.  MyChart is used to connect with patients for Virtual Visits (Telemedicine).  Patients are able to view lab/test results, encounter notes, upcoming appointments, etc.  Non-urgent messages can be sent to your provider as well.   To learn more about what you can do with MyChart, go to [email or web address].    Your next appointment:   6 week(s)  The format for your next appointment:   In Person  Provider:   Shelva Majestic, MD   Other Instructions   Your cardiac CT will be scheduled at one of the below locations:   [facility] [address] [place], [place]  [id number] [phone]   If scheduled at [facility], please arrive at the [facility] main entrance (entrance A) of [facility] 30 minutes prior to test start time. Proceed to the [facility] Radiology Department (first floor) to check-in and test prep.  Please follow these instructions carefully (unless otherwise directed):  Hold all erectile dysfunction medications at least 3 days (72 hrs) prior to test.  On the Night Before the Test: Be sure to Drink plenty of water. Do not consume any caffeinated/decaffeinated beverages or chocolate 12 hours prior to your test. Do not take any antihistamines 12 hours prior to your test.  On the Day of the Test: Drink plenty of water until 1 hour prior to the test. Do not eat any food 4 hours prior to the test. You may take your regular medications prior to the test, EXCEPT for your metoprolol succinate (Toprol XL), you will be taking metoprolol tartrate instead. Take metoprolol tartrate (Lopressor) two hours prior to test. HOLD Furosemide/Hydrochlorothiazide morning of the test.            After the Test: Drink plenty of water. After receiving IV contrast, you may experience a mild flushed feeling. This is normal. On occasion, you may experience a mild rash up to 24 hours after the test. This is not dangerous. If this occurs, you can take Benadryl 25 mg and increase your fluid intake. If you experience trouble breathing, this can be serious. If it is severe call 911 IMMEDIATELY. If it is  mild, please call our office. If you take any of these medications: Glipizide/Metformin, Avandament, Glucavance, please do not take 48 hours after completing test unless otherwise instructed.  Please allow 2-4 weeks for scheduling of routine cardiac CTs. Some insurance companies require a pre-authorization which may delay scheduling of this test.   For non-scheduling related questions, please contact the cardiac imaging nurse navigator should you  have any questions/concerns: Marchia Bond, Cardiac Imaging Nurse Navigator Gordy Clement, Cardiac Imaging Nurse Navigator [facility] Direct Office Dial: [phone]   For scheduling needs, including cancellations and rescheduling, please call [place], [phone].

## 2020-11-29 NOTE — Progress Notes (Signed)
 Cardiology Office Note    Date:  12/06/2020   ID:  Jeff Willis, DOB 12/18/1940, MRN [id number]  PCP:  Willis, Robert, MD  Cardiologist:  Jeff Kelly, MD   New cardiology evaluation referred through the courtesy of Dr. Robert Willis   History of Present Illness:  Jeff Willis is a 80 y.o. male who is originally from [place] but has been living in [place] for [place] years.  He has been followed by Dr. Ramaswamy and is felt to have probable interstitial lung disease.  A high-resolution CT scan of his chest showed probable UIP pattern with possible early honeycombing.  He also was noted to have an ectatic 4.5 cm ascending thoracic aorta and aortic atherosclerosis involving the left main and two-vessel CAD. He recently saw Dr. Ehinger and has had issues with elevated heart rate that seems to have been fairly consistent over several office visits.  He was referred for an echo Doppler study on Sep 07, 2020 by Dr. Ramaswamy EF of 60 to 65% without wall motion abnormalities.  Right ventricular size was normal.  There was mildly elevated pulmonary artery pressure.  Estimated RV systolic pressure was 34.8.  There was mild to moderate tricuspid regurgitation.  His aortic valve was trileaflet and there was no stenosis.  He  admits to a longstanding history of increased heart rate which often is contributed by anxiety.  He drinks tea approximately 2 times per day.  He typically walks 30 minutes 4 days/week and denies any exertional anginal symptomatology.  He specifically denies any chest tightness or exertional shortness of breath.  He denies any presyncope or syncope.  He is unaware of any history of atrial fibrillation.  He had been treated with Ofev for his probable interstitial lung disease but due to significant reduction in appetite this was discontinued approximately 4-1/2 months ago.  He is referred for cardiology evaluation.   Past Medical History:  Diagnosis Date   Back pain     Elevated PSA    Essential tremor    Hypercholesteremia    Neuropathy    Prostate cancer (HCC)    Sinus tachycardia 01/30/2018    Past Surgical History:  Procedure Laterality Date   LUMBAR MICRODISCECTOMY     L4-5   RADIOACTIVE SEED IMPLANT N/A 02/24/2018   Procedure: RADIOACTIVE SEED IMPLANT/BRACHYTHERAPY IMPLANT;  Surgeon: Eskridge, Matthew, MD;  Location: [facility];  Service: Urology;  Laterality: N/A;   SPACE OAR INSTILLATION N/A 02/24/2018   Procedure: SPACE OAR INSTILLATION;  Surgeon: Eskridge, Matthew, MD;  Location: [facility];  Service: Urology;  Laterality: N/A;    Current Medications: Outpatient Medications Prior to Visit  Medication Sig Dispense Refill   aspirin EC 81 MG tablet Take 81 mg by mouth daily. Swallow whole.     Cholecalciferol (VITAMIN D3 PO) Take 1 tablet by mouth every 30 (thirty) days.     famotidine (PEPCID) 40 MG tablet 1 tab(s)     vitamin B-12 (CYANOCOBALAMIN) 1000 MCG tablet Take 1,000 mcg by mouth every Monday, Wednesday, and Friday.      Nintedanib (OFEV) 100 MG CAPS Take 1 capsule (100 mg total) by mouth 2 (two) times daily. 180 capsule 3   pantoprazole (PROTONIX) 40 MG tablet Take 1 tablet by mouth daily.     No facility-administered medications prior to visit.     Allergies:   Patient has no known allergies.   Social History   Socioeconomic History   Marital status: Married      Spouse name: Not on file   Number of children: 3   Years of education: Masters   Highest education level: Not on file  Occupational History   Occupation: Motel Manager    Comment: working part time  Tobacco Use   Smoking status: Never   Smokeless tobacco: Never  Vaping Use   Vaping Use: Never used  Substance and Sexual Activity   Alcohol use: No   Drug use: No   Sexual activity: Yes  Other Topics Concern   Not on file  Social History Narrative   Lives at home with his family.   Right-handed.   No caffeine use.    One daughter and two sons.   Social Determinants of Health   Financial Resource Strain: Not on file  Food Insecurity: Not on file  Transportation Needs: Not on file  Physical Activity: Not on file  Stress: Not on file  Social Connections: Not on file     He is originally from [place], [place].  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  Prior to COVID he traveled extensively.  He is married for 59 years.  He has 3 children.  There is no Bacot use or alcohol use.  He is semiretired.  Family History:  The patient's family history includes Other in his father and mother.   His mother died at age 99.  Father died at age 47 and had pneumonia and fever.  He has a brother living at 78.  He has 1 living sister who is 90.  2 sisters are deceased 1 at age 86 after a fall fracture and another at age 83.  ROS General: Negative; No fevers, chills, or night sweats;  HEENT: Negative; No changes in vision or hearing, sinus congestion, difficulty swallowing Pulmonary: Frequent cough, felt to have UIP Cardiovascular: See HPI GI: Negative; No nausea, vomiting, diarrhea, or abdominal pain GU: Negative; No dysuria, hematuria, or difficulty voiding Musculoskeletal: Negative; no myalgias, joint pain, or weakness Hematologic/Oncology: Negative; no easy bruising, bleeding Endocrine: Negative; no heat/cold intolerance; no diabetes Neuro: Negative; no changes in balance, headaches Skin: Negative; No rashes or skin lesions Psychiatric: Negative; No behavioral problems, depression Sleep: Negative; No snoring, daytime sleepiness, hypersomnolence, bruxism, restless legs, hypnogognic hallucinations, no cataplexy Other comprehensive 14 point system review is negative.   PHYSICAL EXAM:   VS:  BP (!) 146/84   Pulse (!) 108   Ht 5' 6" (1.676 m)   Wt 128 lb (58.1 kg)   SpO2 96%   BMI 20.66 kg/m     Blood pressure by me 146/86  Wt Readings from Last 3 Encounters:  11/29/20 128 lb (58.1 kg)   09/22/20 122 lb 6.4 oz (55.5 kg)  07/24/20 127 lb (57.6 kg)    General: Alert, oriented, no distress.  Skin: normal turgor, no rashes, warm and dry HEENT: Normocephalic, atraumatic. Pupils equal round and reactive to light; sclera anicteric; extraocular muscles intact; mild arcus senilis bilaterally Nose without nasal septal hypertrophy Mouth/Parynx benign; Mallinpatti scale 3 Neck: No JVD, no carotid bruits; normal carotid upstroke Lungs: clear to ausculatation and percussion; no wheezing or rales Chest wall: without tenderness to palpitation Heart: PMI not displaced, RRR, s1 s2 normal, 1/6 systolic murmur, no diastolic murmur, no rubs, gallops, thrills, or heaves Abdomen: soft, nontender; no hepatosplenomehaly, BS+; abdominal aorta nontender and not dilated by palpation. Back: no CVA tenderness Pulses 2+ Musculoskeletal: full range of motion, normal strength, no joint deformities Extremities: no clubbing cyanosis or   edema, Homan's sign negative  Neurologic: grossly nonfocal; Cranial nerves grossly wnl Psychologic: Normal mood and affect   Studies/Labs Reviewed:   EKG:  EKG is ordered today.  ECG (independently read by me): Sinus tachycardia at 108; possible LAE, no ectopy  Recent Labs: BMP Latest Ref Rng & Units 11/30/2020 01/17/2020 02/17/2018  Glucose 65 - 99 mg/dL 87 96 111(H)  BUN 8 - 27 mg/dL 10 10 11  Creatinine 0.76 - 1.27 mg/dL 1.00 1.05 1.08  BUN/Creat Ratio 10 - 24 10 - -  Sodium 134 - 144 mmol/L 142 139 139  Potassium 3.5 - 5.2 mmol/L 4.6 3.9 3.9  Chloride 96 - 106 mmol/L 103 103 106  CO2 20 - 29 mmol/L 25 29 26  Calcium 8.6 - 10.2 mg/dL 9.9 10.4 9.3     Hepatic Function Latest Ref Rng & Units 11/30/2020 03/01/2020 01/17/2020  Total Protein 6.0 - 8.5 g/dL 7.4 7.2 8.0  Albumin 3.7 - 4.7 g/dL 4.1 3.9 4.5  AST 0 - 40 IU/L 17 18 22  ALT 0 - 44 IU/L 11 12 16  Alk Phosphatase 44 - 121 IU/L 62 56 55  Total Bilirubin 0.0 - 1.2 mg/dL 0.5 0.6 0.5  Bilirubin, Direct 0.0 -  0.3 mg/dL - 0.1 0.1    CBC Latest Ref Rng & Units 11/30/2020 01/17/2020 09/07/2019  WBC 3.4 - 10.8 x10E3/uL 7.4 8.9 6.9  Hemoglobin 13.0 - 17.7 g/dL 14.7 15.4 14.0  Hematocrit 37.5 - 51.0 % 43.1 45.3 41.1  Platelets 150 - 450 x10E3/uL 188 205.0 199.0   Lab Results  Component Value Date   MCV 96 11/30/2020   MCV 95.4 01/17/2020   MCV 95.9 09/07/2019   No results found for: TSH No results found for: HGBA1C   BNP No results found for: BNP  ProBNP No results found for: PROBNP   Lipid Panel     Component Value Date/Time   CHOL 203 (H) 11/30/2020 0843   TRIG 75 11/30/2020 0843   HDL 60 11/30/2020 0843   CHOLHDL 3.4 11/30/2020 0843   LDLCALC 129 (H) 11/30/2020 0843   LABVLDL 14 11/30/2020 0843     RADIOLOGY: No results found.   Additional studies/ records that were reviewed today include:   ECHO: 09/07/2020 IMPRESSIONS   1. Left ventricular ejection fraction, by estimation, is 60 to 65%. The  left ventricle has normal function. The left ventricle has no regional  wall motion abnormalities. Left ventricular diastolic parameters are  indeterminate.   2. Right ventricular systolic function is normal. The right ventricular  size is normal. There is mildly elevated pulmonary artery systolic  pressure. The estimated right ventricular systolic pressure is 34.8 mmHg.   3. The mitral valve is normal in structure. No evidence of mitral valve  regurgitation.   4. Tricuspid valve regurgitation is mild to moderate.   5. The aortic valve is tricuspid. Aortic valve regurgitation is not  visualized. No aortic stenosis is present.   6. The inferior vena cava is normal in size with greater than 50%  respiratory variability, suggesting right atrial pressure of 3 mmHg.    CT chest high-resolution: January 13, 2020 IMPRESSION: 1. The appearance of the lungs is compatible with progressive interstitial lung disease categorized as probable usual interstitial pneumonia (UIP) per  current ATS guidelines. 2. Aortic atherosclerosis, in addition to left main and 2 vessel coronary artery disease. In addition, there is mild aneurysmal dilatation of the ascending thoracic aorta (4.5 cm in diameter). Ascending thoracic aortic aneurysm.   Recommend semi-annual imaging followup by CTA or MRA and referral to cardiothoracic surgery if not already obtained. This recommendation follows 2010 ACCF/AHA/AATS/ACR/ASA/SCA/SCAI/SIR/STS/SVM Guidelines for the Diagnosis and Management of Patients With Thoracic Aortic Disease. Circulation. 2010; 121: E266-e369. Aortic aneurysm NOS (ICD10-I71.9).   Aortic Atherosclerosis (ICD10-I70.0). Aortic aneurysm NOS (ICD10-I71.9).    ASSESSMENT:    1. Calcification of coronary artery   2. UIP (usual interstitial pneumonitis) (HCC)   3. Sinus tachycardia   4. Ascending aortic aneurysm (HCC)   5. Hyperlipidemia with target LDL less than 70   6. Chronic cough   7. Gastroesophageal reflux disease without esophagitis     PLAN:  Mr.Tarin is a 80-year-old gentleman who is originally from [place] and is living [place] for the last [place] years.  He has had issues with chronic cough as well as weight loss.  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 Ramaswamy.  He apparently did not tolerate Ofev and currently is not on treatment.  He has a history of GERD for which he is on Pepcid.  He admits to a longstanding history of elevated heart rate.  He denies any presyncope or syncope.  Oftentimes his heart rate is greater than 100.  On his most recent high-resolution CT he was found to have a dilated ascending aorta at 4.5 cm in addition to atherosclerosis involving his left main in 2 vessels.  He denies any chest pain symptoms or significant exertional dyspnea.  He walks approximately 30 minutes a day 4 days/week.  I have reviewed his recent laboratory as well as investigations both by Dr. Ramaswamy as well as  by Dr.Ehinger in detail.  Prior lipid studies in September 2021 showed a total cholesterol of 200 with LDL cholesterol 133.  TSH in April 2022 was 2.6.  Presently, I will recommend he undergo follow-up comprehensive metabolic panel, magnesium level, free T4, free T3, CBC, and fasting lipid studies.  I am starting him initially on metoprolol succinate at low-dose with plans to titrate as needed.  I have scheduled him to undergo coronary CTA for further evaluation of his CT detected coronary atherosclerosis.  He should be treated with metoprolol 100 mg prior to the study but may require ivabradine if heart rate continues to be increased.  I reviewed his echo Doppler study with him in detail.  He ultimately will require follow-up with cardiac surgery in light of his a sending aortic aneurysm.  I will contact him regarding the results of the above studies and see him in follow-up in 6 weeks or sooner as needed.                                                        Medication Adjustments/Labs and Tests Ordered: Current medicines are reviewed at length with the patient today.  Concerns regarding medicines are outlined above.  Medication changes, Labs and Tests ordered today are listed in the Patient Instructions below. Patient Instructions  Medication Instructions:  BEGIN metoprolol succinate (Toprol XL) 25mg (1 tablet) daily.  TWO HOURS BEFORE YOUR CTA - Take metoprolol tartrate 100mg (1 tablet). Take this INSTEAD of your metoprolol succinate (Toprol XL).   *If you need a refill on your cardiac medications before your next appointment, please call your pharmacy*   Lab Work: Tomorrow -   FASTING CBC, CMET, Mg, Free T3, Free T4, Lipid.  If you have labs (blood work) drawn today and your tests are completely normal, you will receive your results only by: [place] (if you have MyChart) OR A paper copy in the mail If you have any lab test that is abnormal or we need to change your treatment, we will  call you to review the results.   Testing/Procedures: Coronary CTA. See directions below.    Follow-Up: At [facility], you and your health needs are our priority.  As part of our continuing mission to provide you with exceptional heart care, we have created designated Provider Care Teams.  These Care Teams include your primary Cardiologist (physician) and Advanced Practice Providers (APPs -  Physician Assistants and Nurse Practitioners) who all work together to provide you with the care you need, when you need it.  We recommend signing up for the patient portal called "MyChart".  Sign up information is provided on this After Visit Summary.  MyChart is used to connect with patients for Virtual Visits (Telemedicine).  Patients are able to view lab/test results, encounter notes, upcoming appointments, etc.  Non-urgent messages can be sent to your provider as well.   To learn more about what you can do with MyChart, go to [email or web address].    Your next appointment:   6 week(s)  The format for your next appointment:   In Person  Provider:   Shelva Majestic, MD   Other Instructions   Your cardiac CT will be scheduled at one of the below locations:   [facility] [address] [place], [place] [address] [phone]   If scheduled at [facility], please arrive at the [facility] main entrance (entrance A) of [facility] 30 minutes prior to test start time. Proceed to the [facility] Radiology Department (first floor) to check-in and test prep.  Please follow these instructions carefully (unless otherwise directed):  Hold all erectile dysfunction medications at least 3 days (72 hrs) prior to test.  On the Night Before the Test: Be sure to Drink plenty of water. Do not consume any caffeinated/decaffeinated beverages or chocolate 12 hours prior to your test. Do not take any antihistamines 12 hours prior to your test.  On the Day of the  Test: Drink plenty of water until 1 hour prior to the test. Do not eat any food 4 hours prior to the test. You may take your regular medications prior to the test, EXCEPT for your metoprolol succinate (Toprol XL), you will be taking metoprolol tartrate instead. Take metoprolol tartrate (Lopressor) two hours prior to test. HOLD Furosemide/Hydrochlorothiazide morning of the test.            After the Test: Drink plenty of water. After receiving IV contrast, you may experience a mild flushed feeling. This is normal. On occasion, you may experience a mild rash up to 24 hours after the test. This is not dangerous. If this occurs, you can take Benadryl 25 mg and increase your fluid intake. If you experience trouble breathing, this can be serious. If it is severe call 911 IMMEDIATELY. If it is mild, please call our office. If you take any of these medications: Glipizide/Metformin, Avandament, Glucavance, please do not take 48 hours after completing test unless otherwise instructed.  Please allow 2-4 weeks for scheduling of routine cardiac CTs. Some insurance companies require a pre-authorization which may delay scheduling of this test.   For non-scheduling related questions, please  contact the cardiac imaging nurse navigator should you have any questions/concerns: Marchia Bond, Cardiac Imaging Nurse Navigator Gordy Clement, Cardiac Imaging Nurse Navigator [facility] Direct Office Dial: [phone]   For scheduling needs, including cancellations and rescheduling, please call [place], [phone].    Signed, Shelva Majestic, MD  12/06/2020 6:25 PM    [place] [address] [place], [place], [place]  [address] Phone: [phone]

## 2020-11-30 LAB — T4, FREE: Free T4: 1.25 ng/dL (ref 0.82–1.77)

## 2020-11-30 LAB — CBC
Hematocrit: 43.1 % (ref 37.5–51.0)
Hemoglobin: 14.7 g/dL (ref 13.0–17.7)
MCH: 32.7 pg (ref 26.6–33.0)
MCHC: 34.1 g/dL (ref 31.5–35.7)
MCV: 96 fL (ref 79–97)
Platelets: 188 10*3/uL (ref 150–450)
RBC: 4.49 x10E6/uL (ref 4.14–5.80)
RDW: 11.5 % — ABNORMAL LOW (ref 11.6–15.4)
WBC: 7.4 10*3/uL (ref 3.4–10.8)

## 2020-11-30 LAB — LIPID PANEL
Chol/HDL Ratio: 3.4 ratio (ref 0.0–5.0)
Cholesterol, Total: 203 mg/dL — ABNORMAL HIGH (ref 100–199)
HDL: 60 mg/dL (ref >39)
LDL Chol Calc (NIH): 129 mg/dL — ABNORMAL HIGH (ref 0–99)
Triglycerides: 75 mg/dL (ref 0–149)
VLDL Cholesterol Cal: 14 mg/dL (ref 5–40)

## 2020-11-30 LAB — T3, FREE: T3, Free: 3.3 pg/mL (ref 2.0–4.4)

## 2020-11-30 LAB — COMPREHENSIVE METABOLIC PANEL
ALT: 11 IU/L (ref 0–44)
AST: 17 IU/L (ref 0–40)
Albumin/Globulin Ratio: 1.2 (ref 1.2–2.2)
Albumin: 4.1 g/dL (ref 3.7–4.7)
Alkaline Phosphatase: 62 IU/L (ref 44–121)
BUN/Creatinine Ratio: 10 (ref 10–24)
BUN: 10 mg/dL (ref 8–27)
Bilirubin Total: 0.5 mg/dL (ref 0.0–1.2)
CO2: 25 mmol/L (ref 20–29)
Calcium: 9.9 mg/dL (ref 8.6–10.2)
Chloride: 103 mmol/L (ref 96–106)
Creatinine, Ser: 1 mg/dL (ref 0.76–1.27)
Globulin, Total: 3.3 g/dL (ref 1.5–4.5)
Glucose: 87 mg/dL (ref 65–99)
Potassium: 4.6 mmol/L (ref 3.5–5.2)
Sodium: 142 mmol/L (ref 134–144)
Total Protein: 7.4 g/dL (ref 6.0–8.5)
eGFR: 77 mL/min/{1.73_m2} (ref >59)

## 2020-11-30 LAB — MAGNESIUM: Magnesium: 2.4 mg/dL — ABNORMAL HIGH (ref 1.6–2.3)

## 2020-12-01 ENCOUNTER — Telehealth: Payer: Self-pay | Admitting: Cardiovascular Disease

## 2020-12-01 NOTE — Telephone Encounter (Signed)
Pt is calling in regards to his scheduled CT scan on 12/08/20, pt does not want the CT Scan done at [facility], he would like it to be done somewhere else. Please advise pt further

## 2020-12-01 NOTE — Telephone Encounter (Signed)
Spoke with pt, aware [facility] is the only place that does the cardiac CT's in this area. He is aware and will keep the appointment he has.

## 2020-12-06 ENCOUNTER — Telehealth (HOSPITAL_COMMUNITY): Payer: Self-pay | Admitting: Emergency Medicine

## 2020-12-06 ENCOUNTER — Encounter: Payer: Self-pay | Admitting: Cardiovascular Disease

## 2020-12-06 NOTE — Telephone Encounter (Signed)
Attempted to call patient regarding upcoming cardiac CT appointment. °Left message on voicemail with name and callback number °Kinley Dozier RN Navigator Cardiac Imaging °[facility] °[phone] Office °[phone] Cell ° °

## 2020-12-07 ENCOUNTER — Telehealth (HOSPITAL_COMMUNITY): Payer: Self-pay | Admitting: Emergency Medicine

## 2020-12-07 NOTE — Telephone Encounter (Signed)
Reaching out to patient to offer assistance regarding upcoming cardiac imaging study; pt verbalizes understanding of appt date/time, parking situation and where to check in, pre-test NPO status and medications ordered, and verified current allergies; name and call back number provided for further questions should they arise Jeff Hanf RN Navigator Cardiac Imaging [facility] [phone] office [phone] cell 

## 2020-12-08 ENCOUNTER — Other Ambulatory Visit: Payer: Self-pay

## 2020-12-08 ENCOUNTER — Telehealth: Payer: Self-pay | Admitting: Cardiovascular Disease

## 2020-12-08 ENCOUNTER — Ambulatory Visit (HOSPITAL_COMMUNITY)
Admission: RE | Admit: 2020-12-08 | Discharge: 2020-12-08 | Disposition: A | Discharge status: Home / Self Care | Payer: Medicare Other | Source: Ambulatory Visit | Attending: Cardiovascular Disease | Admitting: Cardiovascular Disease

## 2020-12-08 DIAGNOSIS — I2510 Atherosclerotic heart disease of native coronary artery without angina pectoris: Secondary | ICD-10-CM

## 2020-12-08 DIAGNOSIS — I2584 Coronary atherosclerosis due to calcified coronary lesion: Secondary | ICD-10-CM | POA: Insufficient documentation

## 2020-12-08 MED ORDER — NITROGLYCERIN 0.4 MG SL SUBL
0.8000 mg | SUBLINGUAL_TABLET | Freq: Once | SUBLINGUAL | Status: AC
  Administered 2020-12-08: 0.8 mg via SUBLINGUAL

## 2020-12-08 MED ORDER — IOHEXOL 350 MG/ML SOLN
100.0000 mL | Freq: Once | INTRAVENOUS | Status: AC | PRN
  Administered 2020-12-08: 100 mL via INTRAVENOUS

## 2020-12-08 MED ORDER — NITROGLYCERIN 0.4 MG SL SUBL
SUBLINGUAL_TABLET | SUBLINGUAL | Status: AC
  Filled 2020-12-08: qty 2

## 2020-12-08 NOTE — Telephone Encounter (Signed)
  Jeff Willis [facility] calling to give CT result

## 2020-12-08 NOTE — Telephone Encounter (Signed)
Spoke to representative   Will send/routed message to Dr Claiborne Billings to review impression of  CCTA from 12/08/20

## 2020-12-12 ENCOUNTER — Other Ambulatory Visit: Payer: Self-pay

## 2020-12-12 DIAGNOSIS — I7121 Aneurysm of the ascending aorta, without rupture: Secondary | ICD-10-CM

## 2020-12-12 DIAGNOSIS — I712 Thoracic aortic aneurysm, without rupture: Secondary | ICD-10-CM

## 2020-12-21 ENCOUNTER — Other Ambulatory Visit: Payer: Self-pay

## 2020-12-21 MED ORDER — ROSUVASTATIN CALCIUM 10 MG PO TABS: 10.0000 mg | ORAL_TABLET | Freq: Every day | ORAL | 3 refills | Status: DC

## 2020-12-28 ENCOUNTER — Institutional Professional Consult (permissible substitution): Payer: Medicare Other | Admitting: Physician Assistant

## 2020-12-28 ENCOUNTER — Other Ambulatory Visit: Payer: Self-pay

## 2020-12-28 VITALS — BP 161/94 | HR 89 | Resp 20 | Ht 66.0 in | Wt 128.4 lb

## 2020-12-28 DIAGNOSIS — I712 Thoracic aortic aneurysm, without rupture, unspecified: Secondary | ICD-10-CM

## 2020-12-28 NOTE — Progress Notes (Signed)
[address]       [place] [address]             [phone]        [place] [facility] [place] Medical Record [id number] Date of Birth: 16-Jul-1940  Referring: Troy Sine, MD Primary Care: Gaynelle Arabian, MD Primary Cardiologist:None  Chief Complaint:    Chief Complaint  Patient presents with   Thoracic Aortic Aneurysm    Initial surgical consult, CT coronary 8/12, ECHO 5/12    History of Present Illness:     Jeff Willis is a 80 y.o. male who has been followed by Dr. Chase Caller and is felt to have probable interstitial lung disease.  A high-resolution CT scan of his chest showed probable UIP pattern with possible early honeycombing.  He also was noted to have a 4.5 cm ascending thoracic aorta and aortic atherosclerosis involving the left main and two-vessel CAD.   He underwent an echo Doppler study on Sep 07, 2020 by Dr. Chase Caller EF of 60 to 65% without wall motion abnormalities.  Right ventricular size was normal.  There was mildly elevated pulmonary artery pressure.  There was mild to moderate tricuspid regurgitation.  His aortic valve was trileaflet and there was no stenosis. He was referred to Dr. Claiborne Billings for his coronary artery disease. We have been asked to see the patient and follow-up on his 4.5 cm ascending thoracic aortic aneurysm.   This year he had a CT coronary study which showed a 4.3 cm mid ascending aortic aneurysm. He had issues with sinus tachycardia and Dr. Claiborne Billings increased his metoprolol to both long acting and short acting. He is on Toprol XL '25mg'$  daily as well as Metoprolol tartrate '100mg'$  BID. He is here for annual surveillance of his ascending aortic aneurysm.   Today, He feels well with no symptoms. He tried to maintain a healthy diet and activity level. His BP is a little high in the office but he states he has had no problems in the past with his blood pressure. They are a little confused why they are here today and I  explained to monitor your ascending aortic aneurysm since it is enlarged.     Current Activity/ Functional Status: Patient is independent with mobility/ambulation, transfers, ADL's, IADL's.   Zubrod Score: At the time of surgery this patient's most appropriate activity status/level should be described as: '[x]'$     0    Normal activity, no symptoms '[]'$     1    Restricted in physical strenuous activity but ambulatory, able to do out light work '[]'$     2    Ambulatory and capable of self care, unable to do work activities, up and about                 more than 50%  Of the time                            '[]'$     3    Only limited self care, in bed greater than 50% of waking hours '[]'$     4    Completely disabled, no self care, confined to bed or chair '[]'$     5    Moribund  Past Medical History:  Diagnosis Date   Back pain    Elevated PSA    Essential tremor    Hypercholesteremia    Neuropathy    Prostate  cancer [facility])    Sinus tachycardia 01/30/2018    Past Surgical History:  Procedure Laterality Date   LUMBAR MICRODISCECTOMY     L4-5   RADIOACTIVE SEED IMPLANT N/A 02/24/2018   Procedure: RADIOACTIVE SEED IMPLANT/BRACHYTHERAPY IMPLANT;  Surgeon: Festus Aloe, MD;  Location: [facility];  Service: Urology;  Laterality: N/A;   SPACE OAR INSTILLATION N/A 02/24/2018   Procedure: SPACE OAR INSTILLATION;  Surgeon: Festus Aloe, MD;  Location: [facility];  Service: Urology;  Laterality: N/A;    Social History   Tobacco Use  Smoking Status Never  Smokeless Tobacco Never    Social History   Substance and Sexual Activity  Alcohol Use No     No Known Allergies  Current Outpatient Medications  Medication Sig Dispense Refill   aspirin EC 81 MG tablet Take 81 mg by mouth daily. Swallow whole.     Cholecalciferol (VITAMIN D3 PO) Take 1 tablet by mouth every 30 (thirty) days.     famotidine (PEPCID) 40 MG tablet 1 tab(s)     metoprolol succinate  (TOPROL XL) 25 MG 24 hr tablet Take 1 tablet (25 mg total) by mouth daily. 90 tablet 3   rosuvastatin (CRESTOR) 10 MG tablet Take 1 tablet (10 mg total) by mouth daily. 90 tablet 3   vitamin B-12 (CYANOCOBALAMIN) 1000 MCG tablet Take 1,000 mcg by mouth every Monday, Wednesday, and Friday.      metoprolol tartrate (LOPRESSOR) 100 MG tablet Take 1 tablet (100 mg total) by mouth once for 1 dose. Take 2 hours prior to cardiac CT. Take this medication instead of your Toprol XL on the day of your scan. 1 tablet 0   No current facility-administered medications for this visit.    (Not in a hospital admission)   Family History  Problem Relation Age of Onset   Other Mother        died at 68 of natural causes   Other Father        unsure of health   Cancer Neg Hx      Review of Systems:   ROS Pertinent items are noted in HPI.       Physical Exam: BP (!) 161/94 (BP Location: Left Arm, Patient Position: Sitting)   Pulse 89   Resp 20   Ht '5\' 6"'$  (1.676 m)   Wt 128 lb 6.4 oz (58.2 kg)   SpO2 94% Comment: RA  BMI 20.72 kg/m    General appearance: alert, cooperative, and no distress Neck: no adenopathy, no carotid bruit, no JVD, and supple, symmetrical, trachea midline Resp: clear to auscultation bilaterally Cardio: regular rate and rhythm, S1, S2 normal, no murmur, click, rub or gallop GI: soft, non-tender; bowel sounds normal; no masses,  no organomegaly Extremities: extremities normal, atraumatic, no cyanosis or edema Neurologic: Grossly normal  Diagnostic Studies & Laboratory data:  ADDENDUM REPORT: 12/08/2020 11:38   HISTORY: CAD monitoring, prior imaging or treadmill < 19yr Multi-vessel coronary calcification   EXAM: Cardiac/Coronary  CT   TECHNIQUE: The patient was scanned on a [employer]   PROTOCOL: A 120 kV prospective scan was triggered in the descending thoracic aorta at 111 HU's. Axial non-contrast 3 mm slices were carried out through the heart.  The data set was analyzed on a dedicated work station and scored using the Agatston method. Gantry rotation speed was 250 msecs and collimation was .6 mm. Beta blockade and 0.8 mg of sl NTG was given. The 3D data set was  reconstructed in 5% intervals of the 35-75 % of the R-R cycle. Systolic and diastolic phases were analyzed on a dedicated work station using MPR, MIP and VRT modes. The patient received 159m OMNIPAQUE IOHEXOL 350 MG/ML SOLN contrast.   FINDINGS: Image quality: Adequate.   Noise artifact is: Mild misregistration.   Coronary calcium score is 18, which places the patient in the 9th percentile for age and sex matched control. Comparison made to white males.   Coronary arteries: Normal coronary origins.  Right dominance.   Right Coronary Artery: Minimal atherosclerotic plaque in the mid RCA, <25% stenosis.   Left Main Coronary Artery: Minimal atherosclerotic plaque in the distal LMCA, <25% stenosis.   Left Anterior Descending Coronary Artery: Mild atherosclerotic plaque at the ostial LAD, 25-49% stenosis. Mild tubular atherosclerotic plaque in the proximal LAD, 25-49% stenosis, but appears to be approaching moderate stenosis.   Left Circumflex Artery: Minimal atherosclerotic plaque in the proximal L circumflex artery, <25% stenosis.   Aorta:   Measurements made in double oblique technique, sinus to sinus:   Sinus of Valsalva:   R-L: 38 mm   L-Non: 37 mm   R-Non: 35 mm   Mid ascending aorta (at PA bifurcation): 43 mm, mild dilation of ascending aorta.   Mid descending aorta (at level of pulmonary veins): 21 mm   Aortic Valve: Tricuspid aortic valve, no calcifications.   Other findings:   Normal pulmonary vein drainage into the left atrium.   Normal left atrial appendage without a thrombus.   Normal size of the pulmonary artery.   IMPRESSION: 1. Mild CAD in ostial and proximal LAD, CADRADS = 2.   2. Coronary calcium score is 18, which places  the patient in the 9th percentile for age and sex matched control.   3. Normal coronary origin with right dominance.   4. Mild dilation of ascending thoracic aorta, 43 mm at mid ascending aorta.   5.  Tricuspid aortic valve     Electronically Signed   By: GCherlynn Kaiser  On: 12/08/2020 11:38  CLINICAL DATA:  80year old male with history of shortness of breath. Evaluate for pulmonary fibrosis.   EXAM: CT CHEST WITHOUT CONTRAST   TECHNIQUE: Multidetector CT imaging of the chest was performed following the standard protocol without intravenous contrast. High resolution imaging of the lungs, as well as inspiratory and expiratory imaging, was performed.   COMPARISON:  Chest CT 09/15/2019.   FINDINGS: Cardiovascular: Heart size is normal. There is no significant pericardial fluid, thickening or pericardial calcification. There is aortic atherosclerosis, as well as atherosclerosis of the great vessels of the mediastinum and the coronary arteries, including calcified atherosclerotic plaque in the left main, left anterior descending and right coronary arteries. Dilatation of the ascending thoracic aorta measuring 4.5 cm in diameter.   Mediastinum/Nodes: No pathologically enlarged mediastinal or hilar lymph nodes. Please note that accurate exclusion of hilar adenopathy is limited on noncontrast CT scans. Esophagus is unremarkable in appearance. No axillary lymphadenopathy.   Lungs/Pleura: High-resolution images demonstrate widespread areas of ground-glass attenuation, septal thickening, thickening of the peribronchovascular interstitium, mild cylindrical bronchiectasis and peripheral bronchiolectasis. There is a suggestion of, but not definitive evidence of, early honeycombing in portions of the lungs. These findings do have a definitive craniocaudal gradient and appear slightly progressive compared to the prior study. Inspiratory and expiratory imaging is unremarkable. No  acute consolidative airspace disease. No pleural effusions. No definite suspicious appearing pulmonary nodules or masses are noted.   Upper Abdomen: Cortical calcification  in the upper pole of the right kidney incidentally noted.   Musculoskeletal: There are no aggressive appearing lytic or blastic lesions noted in the visualized portions of the skeleton.   IMPRESSION: 1. The appearance of the lungs is compatible with progressive interstitial lung disease categorized as probable usual interstitial pneumonia (UIP) per current ATS guidelines. 2. Aortic atherosclerosis, in addition to left main and 2 vessel coronary artery disease. In addition, there is mild aneurysmal dilatation of the ascending thoracic aorta (4.5 cm in diameter). Ascending thoracic aortic aneurysm. Recommend semi-annual imaging followup by CTA or MRA and referral to cardiothoracic surgery if not already obtained. This recommendation follows 2010 ACCF/AHA/AATS/ACR/ASA/SCA/SCAI/SIR/STS/SVM Guidelines for the Diagnosis and Management of Patients With Thoracic Aortic Disease. Circulation. 2010; [id number]. Aortic aneurysm NOS (ICD10-I71.9).   Aortic Atherosclerosis (ICD10-I70.0). Aortic aneurysm NOS (ICD10-I71.9).     Electronically Signed   By: Vinnie Langton M.D.   On: 01/13/2020 15:15     Recent Radiology Findings:   No results found.   I have independently reviewed the above radiologic studies and discussed with the patient   Recent Lab Findings: Lab Results  Component Value Date   WBC 7.4 11/30/2020   HGB 14.7 11/30/2020   HCT 43.1 11/30/2020   PLT 188 11/30/2020   GLUCOSE 87 11/30/2020   CHOL 203 (H) 11/30/2020   TRIG 75 11/30/2020   HDL 60 11/30/2020   LDLCALC 129 (H) 11/30/2020   ALT 11 11/30/2020   AST 17 11/30/2020   NA 142 11/30/2020   K 4.6 11/30/2020   CL 103 11/30/2020   CREATININE 1.00 11/30/2020   BUN 10 11/30/2020   CO2 25 11/30/2020   INR 0.91 02/17/2018       Assessment / Plan:      ILD-managed by Dr. Chase Caller. He has another CT scan scheduled with his office for next week. We will try to coordinate care next year to one annual CT scan to limit radiation.  Ascending Aortic Aneurysm-4.3cm on recent imaging. 4.5 on CT last year. Continue tight HTN control CAD-Dr. Claiborne Billings is following and treating if needed. No chest pain or shortness of breath Sinus Tachycardia-Dr. Claiborne Billings titrated his BB and he is on long-acting and short-acting. HR in the 80s today on exam HTN-elevated in the office today but patient states he has never had an issue. Would recommend home BP monitoring and additional medications can be added next month when he sees Dr. Claiborne Billings Please continue an active life style but avoid heavy lifting. Discussed a low-sodium diet   Plan: Follow-up in 1 year with a CTA of the chest.  Will coordinate with Pulmonary to hopefully limit radiation. Continue tight BP control and statin therapy.     I  spent 30 minutes counseling the patient face to face.   Nicholes Rough, PA-C 12/28/2020 1:56 PM

## 2021-01-04 ENCOUNTER — Ambulatory Visit (INDEPENDENT_AMBULATORY_CARE_PROVIDER_SITE_OTHER)
Admission: RE | Admit: 2021-01-04 | Discharge: 2021-01-04 | Disposition: A | Discharge status: Home / Self Care | Payer: Medicare Other | Source: Ambulatory Visit | Attending: Internal Medicine | Admitting: Internal Medicine

## 2021-01-04 ENCOUNTER — Other Ambulatory Visit: Payer: Self-pay

## 2021-01-04 DIAGNOSIS — R000 Tachycardia, unspecified: Secondary | ICD-10-CM | POA: Diagnosis not present

## 2021-01-04 DIAGNOSIS — J84112 Idiopathic pulmonary fibrosis: Secondary | ICD-10-CM

## 2021-01-11 ENCOUNTER — Ambulatory Visit (INDEPENDENT_AMBULATORY_CARE_PROVIDER_SITE_OTHER): Payer: Medicare Other | Admitting: Internal Medicine

## 2021-01-11 ENCOUNTER — Telehealth: Payer: Self-pay | Admitting: Internal Medicine

## 2021-01-11 ENCOUNTER — Telehealth: Payer: Self-pay | Admitting: Pharmacist

## 2021-01-11 ENCOUNTER — Other Ambulatory Visit: Payer: Self-pay

## 2021-01-11 DIAGNOSIS — R053 Chronic cough: Secondary | ICD-10-CM | POA: Diagnosis not present

## 2021-01-11 DIAGNOSIS — Z5181 Encounter for therapeutic drug level monitoring: Secondary | ICD-10-CM

## 2021-01-11 DIAGNOSIS — J84112 Idiopathic pulmonary fibrosis: Secondary | ICD-10-CM | POA: Diagnosis not present

## 2021-01-11 DIAGNOSIS — R634 Abnormal weight loss: Secondary | ICD-10-CM

## 2021-01-11 DIAGNOSIS — K521 Toxic gastroenteritis and colitis: Secondary | ICD-10-CM

## 2021-01-11 DIAGNOSIS — J849 Interstitial pulmonary disease, unspecified: Secondary | ICD-10-CM

## 2021-01-11 DIAGNOSIS — K219 Gastro-esophageal reflux disease without esophagitis: Secondary | ICD-10-CM

## 2021-01-11 NOTE — Telephone Encounter (Signed)
Spoke to son and updated - son said ofev was causing diarreha, decreased appetite and weight loss -> all of which improved after stopping ofev  Son says cough is gettng worse x 6 weeks  Updted plans for esbriet pharm consult, pft     SIGNATURE    Dr. Brand Males, M.D., F.C.C.P,  Pulmonary and Critical Care Medicine Staff Physician, [place] Director - Interstitial Lung Disease  Program  Pulmonary [place] [facility] [place], [place], [address]  NPI Number:  NPI [id number]  Pager: [phone], If no answer  -> Check AMION or Try [phone] Telephone (clinical office): [phone] Telephone (research): [phone]  5:22 PM 01/11/2021

## 2021-01-11 NOTE — Telephone Encounter (Signed)
Routing to MR so he can call pt's son to discuss today's televisit.

## 2021-01-11 NOTE — Progress Notes (Signed)
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   Prior cough in  2019 for several months  resolved on on some kind of powder inhaler x one week = Advair     History of Present Illness  08/04/2019  Pulmonary/ 1st office eval/[name]  Chief Complaint  Patient presents with   Pulmonary Consult    Referred by Dr [name]. Pt c/o non prod cough and throat clearing x 6 months.   Dyspnea:  None/ no aerobics, walking neighborhoods ok  Cough: always dry/ any candy helps if keeps in mouth but esp hard candy / does not use cough drops  Sleep: rotates on side, bed is flat/ one pillow, never wakes up cough but /win 15 min of stirring recurs daily  SABA use: never, also never prednisone Always trouble swallowing pills x lifetime  Call [name] office and ask what inhaler you received in 2019 as sample that seemed to help your cough = advair dpi Stop cetrizine, flonase  Prednisone 10 mg take  4 each am x 2 days,   2 each am x 2 days,  1 each am x 2 days and stop  Pantoprazole (protonix) 40 mg   Take  30-60 min before first meal of the day and Pepcid (famotidine)  20 mg one after supper  until return to office  GERD   Gabapentin 100 mg four times daily (ok to stop when no coughing at all for a week)  Please schedule a follow up office visit in 4 weeks, sooner if needed  with all medications /inhalers/ solutions in hand so we can verify exactly what you are taking. This includes all  medications from all doctors and over the counters    09/06/2019  f/u ov/[name] re: uacs on gabapenitn 100 mg three times  wolicki had tried gabapentin but failed to reveal that at last ov when I recommended it  Chief Complaint  Patient presents with   Follow-up    4 wk f/u. Wants to discuss switching Advair.   Dyspnea:  No  Cough: better just while on prednisone maybe 50-80%   Sleeping: no noct cough SABA use: none 02: none  rec Gabapentin 200 mg up to 4 x daily  Pantoprazole (protonix) 40 mg   Take  30-60 min before first meal of the day and Pepcid (famotidine)  20 mg one @  bedtime until return to office - this is the best way to tell whether stomach acid is contributing to your problem.   Keep the candy handy  Prednisone 10 mg  Take 4 for three days 3 for three days 2 for three days 1 for three days and stop  If not better in 2 weeks see [name] for your reflux  Please remember to go to the lab department and xray dept  for your tests - we  will call you with the results when they are available.         OV 10/05/2019  Subjective:  [name], male , DOB: October 01, 1940 , age 56 y.o. , MRN: [id number] , ADDRESS: [place] [address]   10/05/2019 -   Chief Complaint  Patient presents with   Follow-up    Pt states he has been doing okay since last visit and denies any complaints.   -80 year old [place] male.  Referred for interstitial lung disease.  He is accompanied by his son Jeff Willis who is a Software engineer.  I know [name]  from a few years ago when heused to accompany his aunt; asthma patient of mine. Lonza DAELYN TOPF  has has been referred by [name] to the ILD center.  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  Up until the pandemic used to travel quite frequently.  He is to go to [place] for 3 months.  At none of the properties he is being exposed to mold.  He developed insidious onset of chronic cough over a year ago  this has been persistent.  In the last few months several cough related treatments have been tried with some improvement.  Has been on gabapentin but this made him sleepy and is currently stopped it.  Because of chronic cough which he only attributes his constant clearing of the throat he had a high-resolution CT scan of the chest that I personally visualized and shows probable UIP pattern and therefore he has been referred here.  In my personal visualization it might even be definite of UIP because there might be some early honeycombing there.  He denies any shortness of breath.  At rest he was found to be tachycardic but he says this can at baseline for him.  He will follow this up with his primary care physician.  [place] [facility] Comprehensive( ILD Questionnaire  Symptoms:   He denies any shortness of breath.  No episodic shortness of breath.  However on the questioning he does report mild shortness of breath climbing up stairs.  In terms of his cough it started in June 2020.  It is the same since it started maybe in the last week it is slightly better.  Is moderate in intensity.  He does clear the throat.  Mostly dry cough occasionally white sputum.  Does feel a tickle in the back of his throat.  He is not waking up in the middle of the night.  Occasionally affects his voice.  No nausea no vomiting no diarrhea no wheezing.   Past Medical History : Denies any asthma COPD or heart failure rheumatoid arthritis or scleroderma or lupus or polymyositis or Sjogren's.  Denies sleep apnea.  Denies HIV denies pulmonary hypertension.  Denies diabetes or thyroid disease or stroke or seizures mononucleosis.  Denies any hepatitis or tuberculosis or kidney disease or pneumonia or blood clots or heart disease or pleurisy.  Does have on and off acid reflux for the last year   ROS: Positive for dry mouth for the last several months.  He also has some dysphagia for swallowing pills unclear for what duration.  He  also has acid reflux on PPI and H2 blocker.  Denies any Raynaud's or recurrent fever or weight loss.  No nausea no vomiting.   FAMILY HISTORY of LUNG DISEASE: * -Denies family history of COPD or asthma sarcoid or cystic fibrosis of hypersensitive pneumonitis or autoimmune disease.   EXPOSURE HISTORY:  Denies any Covid.  Denies smoking cigarettes.  Denies marijuana denies smoking.  Denies IV drug use denies cocaine   HOME and HOBBY DETAILS : Single-family home [place] for the last 10 years.  Age of the home is 15 years.  No dampness.  No mildew.  No humidifier use.  No CPAP use no nebulizer machine use.  He does use a steam iron but there is no mold or mildew in it.  There is no [place] inside the house no pet birds or parakeets.  No pet gerbils no feather pillows no mold in the [facility] duct no music habits.  He does do some gardening very small garden in the house limited gardening mostly just water the garden.  No bird feather exposure no flood damage no strong mats no hot tub or Jacuzzi.  No exposure to animals at work.   OCCUPATIONAL HISTORY (122 questions) :   -Organic antigen exposure is negative inorganic exposure is negative   PULMONARY TOXICITY HISTORY (27 items):  -18-day prednisone course that ended last week.  He did have seen implantation for prostate cancer in October 2019 these were radiation seeds.      Results for CONLAN, CLANIN (MRN [id number]) as of 11/15/2019 16:27  Ref. Range 09/07/2019 11:29 10/05/2019 11:25  Anti Nuclear Antibody (ANA) Latest Ref Range: NEGATIVE   POSITIVE (A)  ANA Pattern 1 Unknown  Nuclear, Speckled (A)  ANA Titer 1 Latest Units: titer  1:40 (H)  Angiotensin-Converting Enzyme Latest Ref Range: 9 - 67 U/L  34  Cyclic Citrullin Peptide Ab Latest Units: UNITS  <16  ds DNA Ab Latest Units: IU/mL  <1  Myeloperoxidase Abs Latest Units: AI  <1.0  Serine Protease 3 Latest Units: AI  <1.0  RA Latex Turbid. Latest Ref Range: <14 IU/mL  <14   IgE (Immunoglobulin E), Serum Latest Ref Range: <OR=114 kU/L 36   SSA (Ro) (ENA) Antibody, IgG Latest Ref Range: <1.0 NEG AI  <1.0 NEG  SSB (La) (ENA) Antibody, IgG Latest Ref Range: <1.0 NEG AI  <1.0 NEG  Scleroderma (Scl-70) (ENA) Antibody, IgG Latest Ref Range: <1.0 NEG AI  <1.0 NEG     Simple office walk 185 feet x  3 laps goal with forehead probe 10/05/2019   O2 used ra  Number laps completed 3  Comments about pace avt  Resting Pulse Ox/HR 100% and 122/min  Final Pulse Ox/HR 100% and 143/min  Desaturated </= 88% x  Desaturated <= 3% points x  Got Tachycardic >/= 90/min x  Symptoms at end of test x  Miscellaneous comments x    Results for CHE, CISSE (MRN [id number]) as of 10/05/2019 10:40  Ref. Range 09/07/2019 11:29  Eosinophils Absolute Latest Ref Range: 0.0 - 0.7 K/uL 0.1     High-resolution CT chest Sep 15, 2019: Personally visualized and agree with the findings below IMPRESSION: 1. Spectrum of findings compatible with basilar predominant fibrotic interstitial lung disease with suggestion of early honeycombing. 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 [place], Iss 5, [phone], Dec 28 2016. 2. Ectatic 4.4 cm ascending thoracic aorta. Recommend annual imaging followup by CTA or MRA. This recommendation follows 2010 ACCF/AHA/AATS/ACR/ASA/SCA/SCAI/SIR/STS/SVM Guidelines for the Diagnosis and Management of Patients with Thoracic Aortic Disease. Circulation. 2010; [id number]. Aortic aneurysm NOS (ICD10-I71.9). 3. One vessel coronary atherosclerosis. 4. Aortic Atherosclerosis (ICD10-I70.0).     Electronically  Signed   By: Ilona Sorrel M.D.   On: 09/15/2019 14:26     Results for ILA, MATHIEU (MRN [id number]) as of 10/05/2019 10:40  Ref. Range 09/07/2019 11:29  Sheep Sorrel IgE Latest Units: kU/L  <0.10  Pecan/Hickory Tree IgE Latest Units: kU/L <0.10  IgE (Immunoglobulin E), Serum Latest Ref Range: <OR=114 kU/L 36  Allergen, D pternoyssinus,d7 Latest Units: kU/L <0.10  Cat Dander Latest Units: kU/L <0.10  Dog Dander Latest Units: kU/L <0.10  [place] Grass Latest Units: kU/L <0.10  Johnson Grass Latest Units: kU/L <0.10  Timothy Grass Latest Units: kU/L <0.10  Cockroach Latest Units: kU/L <0.10  Aspergillus fumigatus, m3 Latest Units: kU/L <0.10  Allergen, Comm Silver Wendee Copp, t9 Latest Units: kU/L <0.10  Allergen, Cottonwood, t14 Latest Units: kU/L <0.10  Elm IgE Latest Units: kU/L <0.10  Allergen, Mulberry, t76 Latest Units: kU/L <0.10  Allergen, Oak,t7 Latest Units: kU/L <0.10  COMMON RAGWEED (SHORT) (W1) IGE Latest Units: kU/L 0.15 (H)  Allergen, Mouse Urine Protein, e78 Latest Units: kU/L <0.10  D. farinae Latest Units: kU/L <0.10  Allergen, Cedar tree, t12 Latest Units: kU/L <0.10  Box Elder IgE Latest Units: kU/L <0.10  Rough Pigweed  IgE Latest Units: kU/L <0.10    ROS - per HPI   OV 11/15/2019  Subjective:  [name], male , DOB: [birth year] , age 37 y.o. , MRN: [id number] , ADDRESS: [place] [address]   11/15/2019 -   Chief Complaint  Patient presents with   Follow-up    Pt states the cough has been better since last visit.      HPI Fontanelle 80 y.o. -presents with his son Jeff Willis for follow-up.  He is here to discuss test results.  In the interim he called for worsening cough.  We gave him prednisone.  He tells me the prednisone actually made the cough worse particularly towards the end.  After that the son  doubled up his PPI and also added H2 blockade and the cough went away completely.  Patient feels completely normal that he is possible that he has interstitial lung disease.  We reviewed the scan and I visualized it with him together and his son.  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  They wanted to discuss the implications  of the scan in the setting of controlled acid reflux and controlled cough but he is largely asymptomatic at this point.  Noted his serologies were negative except for trace ANA and his overnight oxygen study was normal.     OV 01/17/2020   Subjective:  [name], male , DOB: 10/02/1940, age 70 y.o. years. , MRN: [id number],  ADDRESS: [place] [address] PCP  Gaynelle Arabian, MD Providers : Treatment Team:  Attending Provider: Brand Males, MD   Chief Complaint  Patient presents with   Follow-up    pt is here to go over ct and pft    Follow-up interstitial lung disease Follow-up associated cough partially controlled with acid reflux treatment Associated weight loss present   HPI Wylder G Bugh 80 y.o. -presents with his son Jeff Willis.  In the interim he is continue to lose weight.  He has lost another 3-4 pounds since June 2021.  His clothes are much looser.  He thinks he has been steadily losing weight.  His respiratory symptoms are not any worse although he says that his cough that seem to initially improve with doubling up of acid reflux therapy  seems to persist.  At last visit in September 2020 when I thought the cough resolved but they tell me the cough only partially improved.  Nevertheless it is persisting and they are worried about it.  In addition he is worried about weight loss.  He also has poor appetite.  He is not in any pulmonary fibrosis antifibrotic's.  There is no diarrhea.  Symptom score suggest some element of anxiety since his last visit.  He has had pulmonary function test and it shows his FVC is actually better but his DLCO is actually worse.  He had high-resolution CT chest that I visualized and reviewed with him.  It shows probable UIP with possible emerging honeycombing.  I agree with the radiologist.  The radiologist actually feels that it might be a little bit worse compared to few months ago.  Of note: He has  upcoming trip to [place] between March 05, 2020 and February 2022.  He wants to make sure his care is coordinated.  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 Evaluate for pulmonary fibrosis.   EXAM: CT CHEST WITHOUT CONTRAST   TECHNIQUE: Multidetector CT imaging of the chest was performed following the standard protocol without intravenous contrast. High resolution imaging of the lungs, as well as inspiratory and expiratory imaging, was performed.   COMPARISON:  Chest CT 09/15/2019.   FINDINGS: Cardiovascular: Heart size is normal. There is no significant pericardial fluid, thickening or pericardial calcification. There is aortic atherosclerosis, as well as atherosclerosis of the great vessels of the mediastinum and the coronary arteries, including calcified atherosclerotic plaque in the left main, left anterior descending and right coronary arteries. Dilatation of the ascending thoracic aorta measuring 4.5 cm in diameter.   Mediastinum/Nodes: No pathologically enlarged mediastinal or hilar lymph nodes. Please note that accurate exclusion of hilar adenopathy is limited on noncontrast CT scans. Esophagus is unremarkable in appearance. No axillary lymphadenopathy.   Lungs/Pleura: High-resolution images demonstrate widespread areas of ground-glass attenuation, septal thickening, thickening of the peribronchovascular interstitium, mild cylindrical bronchiectasis and peripheral bronchiolectasis. There is a suggestion of, but not definitive evidence of, early honeycombing in portions of the lungs. These findings do have a definitive craniocaudal gradient and appear slightly progressive compared to the prior study. Inspiratory and expiratory imaging is unremarkable. No acute consolidative airspace disease. No pleural effusions. No definite suspicious  appearing pulmonary nodules or masses are noted.   Upper Abdomen: Cortical calcification in the upper pole of the right kidney incidentally noted.   Musculoskeletal: There are no aggressive appearing lytic or blastic lesions noted in the visualized portions of the skeleton.   IMPRESSION: 1. The appearance of the lungs is compatible with progressive interstitial lung disease categorized as probable usual interstitial pneumonia (UIP) per current ATS guidelines. 2. Aortic atherosclerosis, in addition to left main and 2 vessel coronary artery disease. In addition, there is mild aneurysmal dilatation of the ascending thoracic aorta (4.5 cm in diameter). Ascending thoracic aortic aneurysm. Recommend semi-annual imaging followup by CTA or MRA and referral to cardiothoracic surgery if not already obtained. This recommendation follows 2010 ACCF/AHA/AATS/ACR/ASA/SCA/SCAI/SIR/STS/SVM Guidelines for the Diagnosis and Management of Patients With Thoracic Aortic Disease. Circulation. 2010; [id number]. Aortic aneurysm NOS (ICD10-I71.9).   Aortic Atherosclerosis (ICD10-I70.0). Aortic aneurysm NOS (ICD10-I71.9).     Electronically Signed   By: Vinnie Langton M.D.   On: 01/13/2020  15:15    ROS - per HPI    OV 03/01/2020  Subjective:  [name], male , DOB: May 15, 1940 , age 74 y.o. , MRN: [id number] , ADDRESS: [address] [place] [address] PCP Gaynelle Arabian, MD Patient Care Team: Gaynelle Arabian, MD as PCP - General (Family Medicine)  This Provider for this visit: Treatment Team:  Attending Provider: Brand Males, MD  FU IPF Associated weight loss + pre-ofev   03/01/2020 -   Chief Complaint  Patient presents with   Follow-up    ILD, doing ok, cough has not improved     HPI Elvan G Icenhour 79 y.o. -returns for follow-up.  This is after starting nintedanib.  He is currently on 100 mg twice daily for 2 weeks.  His prescription is  approved on February 02, 2020.  He accidentally got 150 mg twice daily but he sent this back.  He is going to send [place] back.  He has upcoming travel to [place] later this week he will be gone there for till February 2022.  He has supply and refill of his nintedanib on 06 March 2020.  After that he does not have nintedanib.  He and his son report that somebody will be able to go to [place] and deliver it to him.  They do not want to trust the courier service to take it there the supply chain issues.  At this point time from a respiratory standpoint he has minimal dyspnea.  He still has a cough.  The cough is not worse but after reducing the PPI dose the cough has come back.  It is somewhat troublesome.  The severity score is listed below.  They report the GI Dr. Collene Mares has reported concern about taking very high-dose of PPI and H2 blockade which actually helped the cough.  Is because of osteoporosis risk.  I agreed with that concern.  However patient is also reporting significant symptom burden.  Primary care has suggested that they follow the symptom control and quality of life issues.  I did support the primary care viewpoint as well.  Did explain to them at the end of the day as a risk-benefit ratio and with an advanced disease like pulmonary fibrosis 1 might have to consider taking a short-term again with cough control at the expense of long-term risk of side effects.  They are going to think about this.  In terms of his travel to [place].  He put me in touch with Dr. Posey Pronto.  I texted him and establish contact.  He will follow with Dr. Posey Pronto who is a pulmonologist trained in [place] but now living in [place].  Gave other travel advice are listed below.  In terms of weight loss: This is ongoing.  Even after starting the Ofev is not having any GI side effects but his weight loss continues.  He had a CT abdomen that is not showing any reason for weight loss.  Apparently had some hematuria had cystoscopy with Dr. Junious Silk.  He  has some radiation seed implants.  There is no bladder cancer.  The really frustrated by this.  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   Prior cough in  2019 for several months  resolved on on some kind of powder inhaler x one week = Advair     History of Present Illness  08/04/2019  Pulmonary/ 1st office eval/[name]  Chief Complaint  Patient presents with   Pulmonary Consult    Referred by Dr [name]. Pt c/o non prod cough and throat clearing x 6 months.   Dyspnea:  None/ no aerobics, walking neighborhoods ok  Cough: always dry/ any candy helps if keeps in mouth but esp hard candy / does not use cough drops  Sleep: rotates on side, bed is flat/ one pillow, never wakes up cough but /win 15 min of stirring recurs daily  SABA use: never, also never prednisone Call [name] office and ask what inhaler you received in 2019 as sample that seemed to help your cough = advair dpi Stop cetrizine, flonase  Prednisone 10 mg take  4 each am x 2 days,   2 each am x 2 days,  1 each am x 2 days and stop  Pantoprazole (protonix) 40 mg   Take  30-60 min before first meal of the day and Pepcid (famotidine)  20 mg one after supper  until return to office  GERD   Gabapentin 100 mg  four times daily (ok to stop when no coughing at all for a week)  Please schedule a follow up office visit in 4 weeks, sooner if needed  with all medications /inhalers/ solutions in hand so we can verify exactly what you are taking. This includes all medications from all doctors and over the counters    09/06/2019  f/u ov/[name] re: uacs on gabapenitn 100 mg three times  wolicki had tried gabapentin but failed to reveal that at last ov when I recommended it  Chief Complaint  Patient presents with   Follow-up    4 wk f/u. Wants to discuss switching Advair.   Dyspnea:  No  Cough: better just while on prednisone maybe 50-80%   Sleeping: no noct cough SABA use: none 02: none  rec Gabapentin 200 mg up to 4 x daily  Pantoprazole (protonix) 40 mg   Take  30-60 min before first meal of the day and Pepcid (famotidine)  20 mg one @  bedtime until return to office - this is the best way to tell whether stomach acid is contributing to your problem.   Keep the candy handy  Prednisone 10 mg  Take 4 for three days 3 for three days 2 for three days 1 for three days and stop  If not better in 2 weeks see [name] for your reflux  Please remember to go to the lab department and xray dept  for your tests - we will call you with the results when they are available.         OV 10/05/2019  Subjective:  [name], male ,  DOB: [birth year] , age 75 y.o. , MRN: [id number] , ADDRESS: [place] [address]   10/05/2019 -   Chief Complaint  Patient presents with   Follow-up    Pt states he has been doing okay since last visit and denies any complaints.   -80 year old [place] male.  Referred for interstitial lung disease.  He is accompanied by his son Jeff Willis who is a Software engineer.  I know [name]  from a few years ago when heused to accompany his aunt; asthma patient of mine. Rylan AJIT YOHEY  has has been referred by [name] to the ILD center.  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  Up until the pandemic used to travel quite frequently.  He is to go to [place] for 3 months.  At none of the properties he is being exposed to mold.  He developed insidious onset of chronic cough over a year ago this has been persistent.  In the last few months several cough related treatments have been tried with some improvement.  Has been on gabapentin but this made him sleepy and is currently stopped it.  Because of chronic cough which he only attributes his constant clearing of the throat he had a high-resolution CT scan of the chest that I personally visualized and shows probable UIP pattern and therefore he has been referred here.  In my personal visualization it might even be definite of UIP because there might be some early honeycombing there.  He denies any shortness of breath.  At rest he was found to be tachycardic but he says this can at baseline for him.  He will follow this up with his primary care physician.  [facility]( ILD Questionnaire  Symptoms:   He denies any shortness of breath.  No episodic shortness of breath.  However on the questioning he does report mild shortness of breath climbing up stairs.  In terms of his cough it started in June 2020.  It is the same since it started maybe in the last week it is slightly better.  Is moderate in intensity.  He does clear the throat.  Mostly dry cough occasionally white sputum.  Does feel a tickle in the back of his throat.  He is not waking up in the middle of the night.  Occasionally affects his voice.  No nausea no vomiting no diarrhea no wheezing.  SYMPTOM SCALE - ILD 10/05/2019   O2 use ra  Shortness of Breath 0 -> 5 scale with 5 being worst (score 6 If unable to do)  At rest 0  Simple tasks - showers, clothes change, eating, shaving 0  Household (dishes, doing bed, laundry) 0  Shopping 0  Walking level at own pace 0  Walking up Stairs 1  Total (30-36) Dyspnea Score  1  How bad is your cough? 2  How bad is your fatigue 0  How bad is nausea 0  How bad is vomiting?  00  How bad is diarrhea? 0  How bad is anxiety? 00  How bad is depression 0      Past Medical History : Denies any asthma COPD or heart failure rheumatoid arthritis or scleroderma or lupus or polymyositis or Sjogren's.  Denies sleep apnea.  Denies HIV denies pulmonary hypertension.  Denies diabetes or thyroid disease or stroke or seizures mononucleosis.  Denies any hepatitis or tuberculosis or kidney disease or pneumonia or blood clots or heart disease or pleurisy.  Does have  on and off acid reflux for the last year   ROS: Positive for dry mouth for the last several months.  He also has some dysphagia for swallowing pills unclear for what duration.  He also has acid reflux on PPI and H2 blocker.  Denies any Raynaud's or recurrent fever or weight loss.  No nausea no vomiting.   FAMILY HISTORY of LUNG DISEASE: * -Denies family history of COPD or asthma sarcoid or cystic fibrosis of hypersensitive pneumonitis or autoimmune disease.   EXPOSURE HISTORY: Denies any Covid.  Denies smoking cigarettes.  Denies marijuana denies smoking.  Denies IV drug use denies cocaine   HOME and HOBBY DETAILS : Single-family home [place] for the last 10 years.  Age of the home is 15 years.  No dampness.  No mildew.  No humidifier use.  No CPAP use no nebulizer machine use.  He does use a steam iron but there is no mold or mildew in it.  There is no [place] inside the house no pet birds or parakeets.  No pet gerbils no feather pillows no mold in the [facility] duct no music habits.  He does do some gardening very small garden in the house limited gardening mostly just water the garden.  No bird feather exposure no flood damage no strong mats no hot tub or Jacuzzi.  No exposure to animals at work.   OCCUPATIONAL HISTORY (122 questions) :   -Organic antigen exposure is negative inorganic exposure is  negative   PULMONARY TOXICITY HISTORY (27 items):  -18-day prednisone course that ended last week.  He did have seen implantation for prostate cancer in October 2019 these were radiation seeds.      Results for BREYDON, BRUM (MRN [id number]) as of 11/15/2019 16:27  Ref. Range 09/07/2019 11:29 10/05/2019 11:25  Anti Nuclear Antibody (ANA) Latest Ref Range: NEGATIVE   POSITIVE (A)  ANA Pattern 1 Unknown  Nuclear, Speckled (A)  ANA Titer 1 Latest Units: titer  1:40 (H)  Angiotensin-Converting Enzyme Latest Ref Range: 9 - 67 U/L  34  Cyclic Citrullin Peptide Ab Latest Units: UNITS  <16  ds DNA Ab Latest Units: IU/mL  <1  Myeloperoxidase Abs Latest Units: AI  <1.0  Serine Protease 3 Latest Units: AI  <1.0  RA Latex Turbid. Latest Ref Range: <14 IU/mL  <14  IgE (Immunoglobulin E), Serum Latest Ref Range: <OR=114 kU/L 36   SSA (Ro) (ENA) Antibody, IgG Latest Ref Range: <1.0 NEG AI  <1.0 NEG  SSB (La) (ENA) Antibody, IgG Latest Ref Range: <1.0 NEG AI  <1.0 NEG  Scleroderma (Scl-70) (ENA) Antibody, IgG Latest Ref Range: <1.0 NEG AI  <1.0 NEG     Simple office walk 185 feet x  3 laps goal with forehead probe 10/05/2019   O2 used ra  Number laps completed 3  Comments about pace avt  Resting Pulse Ox/HR 100% and 122/min  Final Pulse Ox/HR 100% and 143/min  Desaturated </= 88% x  Desaturated <= 3% points x  Got Tachycardic >/= 90/min x  Symptoms at end of test x  Miscellaneous comments x    Results for SINCEAR, TANTON (MRN [id number]) as of 10/05/2019 10:40  Ref. Range 09/07/2019 11:29  Eosinophils Absolute Latest Ref Range: 0.0 - 0.7 K/uL 0.1     High-resolution CT chest Sep 15, 2019: Personally visualized and agree with the findings below IMPRESSION: 1. Spectrum of findings compatible with basilar predominant fibrotic interstitial lung disease with suggestion of early  honeycombing. 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 [place], Iss 5, [phone], Dec 28 2016. 2. Ectatic 4.4 cm ascending thoracic aorta. Recommend annual imaging followup by CTA or MRA. This recommendation follows 2010 ACCF/AHA/AATS/ACR/ASA/SCA/SCAI/SIR/STS/SVM Guidelines for the Diagnosis and Management of Patients with Thoracic Aortic Disease. Circulation. 2010; [id number]. Aortic aneurysm NOS (ICD10-I71.9). 3. One vessel coronary atherosclerosis. 4. Aortic Atherosclerosis (ICD10-I70.0).     Electronically Signed   By: Ilona Sorrel M.D.   On: 09/15/2019 14:26     Results for PENIEL, ANDELIN (MRN [id number]) as of 10/05/2019 10:40  Ref. Range 09/07/2019 11:29  Sheep Sorrel IgE Latest Units: kU/L <0.10  Pecan/Hickory Tree IgE Latest Units: kU/L <0.10  IgE (Immunoglobulin E), Serum Latest Ref Range: <OR=114 kU/L 36  Allergen, D pternoyssinus,d7 Latest Units: kU/L <0.10  Cat Dander Latest Units: kU/L <0.10  Dog Dander Latest Units: kU/L <0.10  [place] Grass Latest Units: kU/L <0.10  Johnson Grass Latest Units: kU/L <0.10  Timothy Grass Latest Units: kU/L <0.10  Cockroach Latest Units: kU/L <0.10  Aspergillus fumigatus, m3 Latest Units: kU/L <0.10  Allergen, Comm Silver Wendee Copp, t9 Latest Units: kU/L <0.10  Allergen, Cottonwood, t14 Latest Units: kU/L <0.10  Elm IgE Latest Units: kU/L <0.10  Allergen, Mulberry, t76 Latest Units: kU/L <0.10  Allergen, Oak,t7 Latest Units: kU/L <0.10  COMMON RAGWEED (SHORT) (W1) IGE Latest Units: kU/L 0.15 (H)  Allergen, Mouse Urine Protein, e78 Latest Units: kU/L <0.10  D. farinae Latest Units: kU/L <0.10  Allergen, Cedar tree, t12 Latest Units: kU/L <0.10  Box Elder IgE Latest Units: kU/L <0.10  Rough Pigweed  IgE Latest Units: kU/L <0.10    ROS - per HPI   OV 11/15/2019  Subjective:  [name], male , DOB: April 05, 1941 , age 28 y.o. , MRN: [id number] , ADDRESS:  [place] [id number]   11/15/2019 -   Chief Complaint  Patient presents with   Follow-up    Pt states the cough has been better since last visit.      HPI [place] 80 y.o. -presents with his son Jeff Willis for follow-up.  He is here to discuss test results.  In the interim he called for worsening cough.  We gave him prednisone.  He tells me the prednisone actually made the cough worse particularly towards the end.  After that the son  doubled up his PPI and also added H2 blockade and the cough went away completely.  Patient feels completely normal that he is possible that he has interstitial lung disease.  We reviewed the scan and I visualized it with him together and his son.  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  They wanted to discuss the implications of the scan in the setting of controlled acid reflux and controlled cough but he is largely asymptomatic at this point.  Noted his serologies were negative except for trace ANA and his overnight oxygen study was normal.     OV 01/17/2020   Subjective:  [name], male , DOB: [birth year], age 89 y.o. years. , MRN: [id number],  ADDRESS: [place] [address] PCP  Gaynelle Arabian, MD Providers : Treatment Team:  Attending Provider: Brand Males, MD   Chief Complaint  Patient presents with   Follow-up    pt is here to go over ct and pft    Follow-up interstitial  lung disease Follow-up associated cough partially controlled with acid reflux treatment Associated weight loss present   HPI Seanpaul G Alwin 80 y.o. -presents with his son Jeff Willis.  In the interim he is continue to lose weight.  He has lost another 3-4 pounds since June 2021.  His clothes are much looser.  He thinks he has been steadily losing weight.  His respiratory symptoms are not any worse although he says that his cough that seem to initially improve with doubling up of acid reflux therapy seems to persist.  At last  visit in September 2020 when I thought the cough resolved but they tell me the cough only partially improved.  Nevertheless it is persisting and they are worried about it.  In addition he is worried about weight loss.  He also has poor appetite.  He is not in any pulmonary fibrosis antifibrotic's.  There is no diarrhea.  Symptom score suggest some element of anxiety since his last visit.  He has had pulmonary function test and it shows his FVC is actually better but his DLCO is actually worse.  He had high-resolution CT chest that I visualized and reviewed with him.  It shows probable UIP with possible emerging honeycombing.  I agree with the radiologist.  The radiologist actually feels that it might be a little bit worse compared to few months ago.  Of note: He has upcoming trip to [place] between March 05, 2020 and February 2022.  He wants to make sure his care is coordinated.  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 Evaluate for pulmonary fibrosis.   EXAM: CT CHEST WITHOUT CONTRAST   TECHNIQUE: Multidetector CT imaging of the chest was performed following the standard protocol without intravenous contrast. High resolution imaging of the lungs, as well as inspiratory and expiratory imaging, was performed.   COMPARISON:  Chest CT 09/15/2019.   FINDINGS: Cardiovascular: Heart size is normal. There is no significant pericardial fluid, thickening or pericardial calcification. There is aortic atherosclerosis, as well as atherosclerosis of the great vessels of the mediastinum and the coronary arteries, including calcified atherosclerotic plaque in the left main, left anterior descending and right coronary arteries. Dilatation of the ascending thoracic aorta measuring 4.5 cm in diameter.   Mediastinum/Nodes: No pathologically enlarged mediastinal or  hilar lymph nodes. Please note that accurate exclusion of hilar adenopathy is limited on noncontrast CT scans. Esophagus is unremarkable in appearance. No axillary lymphadenopathy.   Lungs/Pleura: High-resolution images demonstrate widespread areas of ground-glass attenuation, septal thickening, thickening of the peribronchovascular interstitium, mild cylindrical bronchiectasis and peripheral bronchiolectasis. There is a suggestion of, but not definitive evidence of, early honeycombing in portions of the lungs. These findings do have a definitive craniocaudal gradient and appear slightly progressive compared to the prior study. Inspiratory and expiratory imaging is unremarkable. No acute consolidative airspace disease. No pleural effusions. No definite suspicious appearing pulmonary nodules or masses are noted.   Upper Abdomen: Cortical calcification in the upper pole of the right kidney incidentally noted.   Musculoskeletal: There are no aggressive appearing lytic or blastic lesions noted in the visualized portions of the skeleton.   IMPRESSION: 1. The appearance of the lungs is compatible with progressive interstitial lung disease categorized as probable usual interstitial pneumonia (UIP) per current ATS guidelines. 2. Aortic atherosclerosis, in addition to left main and 2 vessel coronary artery  disease. In addition, there is mild aneurysmal dilatation of the ascending thoracic aorta (4.5 cm in diameter). Ascending thoracic aortic aneurysm. Recommend semi-annual imaging followup by CTA or MRA and referral to cardiothoracic surgery if not already obtained. This recommendation follows 2010 ACCF/AHA/AATS/ACR/ASA/SCA/SCAI/SIR/STS/SVM Guidelines for the Diagnosis and Management of Patients With Thoracic Aortic Disease. Circulation. 2010; [id number]. Aortic aneurysm NOS (ICD10-I71.9).   Aortic Atherosclerosis (ICD10-I70.0). Aortic aneurysm NOS (ICD10-I71.9).      Electronically Signed   By: Vinnie Langton M.D.   On: 01/13/2020 15:15    ROS - per HPI    OV 03/01/2020  Subjective:  [name], male , DOB: 01/14/1941 , age 63 y.o. , MRN: [id number] , ADDRESS: [address] [place] [address] PCP Gaynelle Arabian, MD Patient Care Team: Gaynelle Arabian, MD as PCP - General (Family Medicine)  This Provider for this visit: Treatment Team:  Attending Provider: Brand Males, MD  FU IPF Associated weight loss + pre-ofev   03/01/2020 -   Chief Complaint  Patient presents with   Follow-up    ILD, doing ok, cough has not improved     HPI Javoris G Boggess 79 y.o. -returns for follow-up.  This is after starting nintedanib.  He is currently on 100 mg twice daily for 2 weeks.  His prescription is approved on February 02, 2020.  He accidentally got 150 mg twice daily but he sent this back.  He is going to send [place] back.  He has upcoming travel to [place] later this week he will be gone there for till February 2022.  He has supply and refill of his nintedanib on 06 March 2020.  After that he does not have nintedanib.  He and his son report that somebody will be able to go to [place] and deliver it to him.  They do not want to trust the courier service to take it there the supply chain issues.  At this point time from a respiratory standpoint he has minimal dyspnea.  He still has a cough.  The cough is not worse but after reducing the PPI dose the cough has come back.  It is somewhat troublesome.  The severity score is listed below.  They report the GI Dr. Collene Mares has reported concern about taking very high-dose of PPI and H2 blockade which actually helped the cough.  Is because of osteoporosis risk.  I agreed with that concern.  However patient is also reporting significant symptom burden.  Primary care has suggested that they follow the symptom control and quality of life issues.  I did support the primary care viewpoint as well.   Did explain to them at the end of the day as a risk-benefit ratio and with an advanced disease like pulmonary fibrosis 1 might have to consider taking a short-term again with cough control at the expense of long-term risk of side effects.  They are going to think about this.  In terms of his travel to [place].  He put me in touch with Dr. Posey Pronto.  I texted him and establish contact.  He will follow with Dr. Posey Pronto who is a pulmonologist trained in [place] but now living in [place].  Gave other travel advice are listed below.  In terms of weight loss: This is ongoing.  Even after starting the Ofev is not having any GI side effects but his weight loss continues.  He had a CT abdomen that is not showing any reason for weight loss.  Apparently had  some hematuria had cystoscopy with Dr. Junious Silk.  He has some radiation seed implants.  There is no bladder cancer.  The really frustrated by this.  I did pull out an article about IPF and weight loss for the son to read.- shows IPF weight loss is asssocited with worse progrnosis    ROS - per HPI    OV 07/24/2020  Subjective:  [name], male , DOB: [birth year] , age 71 y.o. , MRN: [id number] , ADDRESS: [address] [place] [address] PCP Gaynelle Arabian, MD Patient Care Team: Gaynelle Arabian, MD as PCP - General (Family Medicine)  This Provider for this visit: Treatment Team:  Attending Provider: Brand Males, MD    07/24/2020 -   Chief Complaint  Patient presents with   Follow-up    4 mo f/u. States his breathing has been stable since last visit. Has noticed a decrease in his appetite. Increased diarrhea for the past week.    Idiopathic pulmonary fibrosis on nintedanib 100 mg twice daily -last CT September 2021 Weight loss unintentional even before nintedanib  HPI Valeriano G Halt 80 y.o. -returns for follow-up.  He was in [place] and returned back in early March 2022.  He is now here with his wife.  He tells me while  he was in [place] for 4 months he had a great time.  He barely had any diarrhea.  Approximately 2 months ago which is 1 month before his return he switched to an [place] version of nintedanib called NINTIB.  He was taking it for a month in [place] and did not have any problems.  Upon returning to the [place] he is starting to have diarrhea.  The diarrhea is ongoing and is 3 out of 5.  Happens 2 or 3 times a week and controlled by Imodium.  His son who is a Software engineer thinks it is because he is in the [place] version of nintedanib.  He says he spoke to the [place] pulmonologist and was told that exponentially there is no difference in diarrhea between the [place] version of nintedanib and the [place] version of nintedanib.  Diarrhea happens randomly does bother him.  He says his diet has not changed since returning to the [place].  He says he eats the same [place].  He does take sugar with tea in the morning.  In terms of his tachycardia it still is ongoing.  He is never had echocardiogram.  In terms of weight loss he continues to lose weight.  This weight loss started even before he was on nintedanib.  It is still ongoing.  He is lost 10 pounds since June 2021.  He is worried about this.  PFT  OV 09/22/2020  Subjective:  [name], male , DOB: 08/19/1940 , age 63 y.o. , MRN: [id number] , ADDRESS: [address] [place] [address] PCP Gaynelle Arabian, MD Patient Care Team: Gaynelle Arabian, MD as PCP - General (Family Medicine)  This Provider for this visit: Treatment Team:  Attending Provider: Brand Males, MD    09/22/2020 -   Chief Complaint  Patient presents with   Follow-up    2 mo f/u after PFT. States he is still struggling with diarrhea and losing weight. States his breathing has been stable since last visit.    Idiopathic pulmonary fibrosis on nintedanib 100 mg twice daily since Oct 2021   -last CT September 2021 Weight loss unintentional  even before nintedanib Cough Tachycardia  NOS  HPI Hever G Birman 80 y.o. -returns for follow-up of all the above medical issues.  From a respiratory standpoint he continues to be stable.  He had pulmonary function test that shows continued stability/improvement.  His cough continues.  He in fact he tells me that for the last 2 or 3 years mostly when he drinks water or eats food he starts choking and coughing and clears his throat.  He has now seen ENT and the dysphagia evaluation is set up.  He is continue to lose weight.  In fact he is lost 10 pounds of weight since starting nintedanib.  The weight loss was the even before nintedanib but has accelerated after nintedanib.  He has diarrhea with the nintedanib.  This is on low-dose nintedanib.  Currently is on the [place] version of the nintedanib but he still losing weight and having the same GI symptoms.  In terms of his tachycardia he had echocardiogram and this is normal.  He says his wife is very concerned about his weight loss.  His pants are loose now.      ECHO MAY 2022   IMPRESSIONS     1. Left ventricular ejection fraction, by estimation, is 60 to 65%. The  left ventricle has normal function. The left ventricle has no regional  wall motion abnormalities. Left ventricular diastolic parameters are  indeterminate.   2. Right ventricular systolic function is normal. The right ventricular  size is normal. There is mildly elevated pulmonary artery systolic  pressure. The estimated right ventricular systolic pressure is [id number] mmHg.   3. The mitral valve is normal in structure. No evidence of mitral valve  regurgitation.   4. Tricuspid valve regurgitation is mild to moderate.   5. The aortic valve is tricuspid. Aortic valve regurgitation is not  visualized. No aortic stenosis is present.   6. The inferior vena cava is normal in size with greater than 50%  respiratory variability, suggesting right atrial pressure of 3 mmHg.      OV 01/11/2021  Subjective:  [name], male , DOB: 11/12/1940 , age 5 y.o. , MRN: [id number] , ADDRESS: [place] [id number] PCP Gaynelle Arabian, MD Patient Care Team: Gaynelle Arabian, MD as PCP - General (Family Medicine)  This Provider for this visit: Treatment Team:  Attending Provider: Brand Males, MD Type of visit: Telephone/Video Circumstance: COVID-19 national emergency Identification of patient RACYN REINWALD with Sep 07, 1940 and MRN [id number] - 2 person identifier Risks: Risks, benefits, limitations of telephone visit explained. Patient understood and verbalized agreement to proceed Anyone else on call: no one other than patient and MD Patient location: patient home This provider location: [address]; [place], [place], [place]   01/11/2021 -  IPF followup   Idiopathic pulmonary fibrosis on nintedanib 100 mg twice daily since Oct 2021   -last CT September 2021 Weight loss unintentional even before nintedanib Cough Tachycardia NOS  HPI Elizandro G Selmon 80 y.o. - off ofev since may 2022 Gained weight 5#/ Minimal dyspnea to no dyspnea even with exertion. [place] in interim and had good time. Did water slide. Cough is a bit worse. Othewrise well. Enegery level goodl Though cough is mild - wants something for cough relief thought says medicine from [place] helping.  Plans to go to [place] on February 21, 2021     SYMPTOM SCALE - ILD 10/05/2019 137 pounds 01/17/2020 134 pounds 03/01/2020 131# - start ofev oct 2021 07/24/2020 127#  on ofev (nintib from [place] by [facility] x 2 months) '100mg'$  bid 09/22/2020 122# - on ofev '100mg'$  bid 01/11/2021 Tele visit - off ofev  128# not on any antifibrotic  O2 use ra ra ra ra ra   Shortness of Breath 0 -> 5 scale with 5 being worst (score 6 If unable to do)       At rest 0 0 0 0 0   Simple tasks - showers, clothes change, eating, shaving 0 0 0 1 0   Household (dishes, doing  bed, laundry) 0 1 1  0 0   Shopping 0 0 0 0    Walking level at own pace 0 0 0 0 0   Walking up Stairs 1 0 1 0 0   Total (30-36) Dyspnea Score '1 1 2 1 '$ 0   How bad is your cough? 2 3 0 2 2   How bad is your fatigue 0 0 0 0 0   How bad is nausea 0 0 0 1 0   How bad is vomiting?  00 0 0 0 0   How bad is diarrhea? 0 0 0 3 3   How bad is anxiety? 00 '1 1 1 '$ 0   How bad is depression 0 '1 1 1 '$ 0          Simple office walk 185 feet x  3 laps goal with forehead probe 10/05/2019  03/01/2020  07/24/2020  09/22/2020   O2 used ra ra  ra  Number laps completed 3   3  Comments about pace avt   Mod paced  Resting Pulse Ox/HR 100% and 122/min 98% and 110 1-00% and 120 100% and 109/min  Final Pulse Ox/HR 100% and 143/min 97% and 130 99% and 130 99% and 124/min  Desaturated </= 88% x x  no  Desaturated <= 3% points x x  no  Got Tachycardic >/= 90/min x x  ye  Symptoms at end of test x x  none  Miscellaneous comments x x No dyspnea No dyspnea    PFT  PFT Results Latest Ref Rng & Units 09/22/2020 01/17/2020 10/06/2019  FVC-Pre L 2.81 - 2.59  FVC-Predicted Pre % 89 83 78  Pre FEV1/FVC % % 87 86 86  FEV1-Pre L 2.45 2.37 2.24  FEV1-Predicted Pre % 102 95 89  DLCO uncorrected ml/min/mmHg 16.91 13.79 15.70  DLCO UNC% % 62 48 55  DLCO corrected ml/min/mmHg 16.91 13.79 15.70  DLCO COR %Predicted % 62 48 55  DLVA Predicted % 89 81 97   Narrative & Impression  CLINICAL DATA:  80 year old male with history of idiopathic pulmonary fibrosis. Follow-up study.   EXAM: CT CHEST WITHOUT CONTRAST   TECHNIQUE: Multidetector CT imaging of the chest was performed following the standard protocol without intravenous contrast. High resolution imaging of the lungs, as well as inspiratory and expiratory imaging, was performed.   COMPARISON:  Chest CT 12/08/2020.   FINDINGS: Cardiovascular: Heart size is normal. There is no significant pericardial fluid, thickening or pericardial calcification. There  is aortic atherosclerosis, as well as atherosclerosis of the great vessels of the mediastinum and the coronary arteries, including calcified atherosclerotic plaque in the left main and left anterior descending coronary arteries. Aneurysmal dilatation of the ascending thoracic aorta which measures 4.6 cm in diameter.   Mediastinum/Nodes: No pathologically enlarged mediastinal or hilar lymph nodes. Please note that accurate exclusion of hilar adenopathy is limited on noncontrast CT scans. Esophagus is unremarkable in appearance. No axillary  lymphadenopathy.   Lungs/Pleura: High-resolution images again demonstrate widespread areas of ground-glass attenuation, septal thickening, subpleural reticulation, traction bronchiectasis, peripheral bronchiolectasis, and some areas of early honeycombing scattered throughout the lungs bilaterally. Findings appear mildly progressive compared to the prior examination, and again demonstrate a definitive craniocaudal gradient. Inspiratory and expiratory imaging is unremarkable. No acute consolidative airspace disease. No pleural effusions. No definite suspicious appearing pulmonary nodules or masses are noted.   Upper Abdomen: Aortic atherosclerosis. 5 mm nonobstructive calculus in the upper pole collecting system of the right kidney.   Musculoskeletal: There are no aggressive appearing lytic or blastic lesions noted in the visualized portions of the skeleton.   IMPRESSION: 1. Progressively worsening pulmonary fibrosis with appearance of the lungs which is now considered diagnostic of usual interstitial pneumonia (UIP) per current ATS guidelines, as above. 2. Aortic atherosclerosis, in addition to left main and left anterior descending coronary artery disease. Please note that although the presence of coronary artery calcium documents the presence of coronary artery disease, the severity of this disease and any potential stenosis cannot be assessed  on this non-gated CT examination. Assessment for potential risk factor modification, dietary therapy or pharmacologic therapy may be warranted, if clinically indicated. 3. Aneurysmal dilatation of the ascending thoracic aorta (4.6 cm in diameter). Ascending thoracic aortic aneurysm. Recommend semi-annual imaging followup by CTA or MRA and referral to cardiothoracic surgery if not already obtained. This recommendation follows 2010 ACCF/AHA/AATS/ACR/ASA/SCA/SCAI/SIR/STS/SVM Guidelines for the Diagnosis and Management of Patients With Thoracic Aortic Disease. Circulation. 2010; [id number]. Aortic aneurysm NOS (ICD10-I71.9). 4. 5 mm nonobstructive calculus in the upper pole collecting system of the right kidney.   Aortic Atherosclerosis (ICD10-I70.0). Aortic aneurysm NOS (ICD10-I71.9).     Electronically Signed   By: Vinnie Langton M.D.   On: 01/04/2021 09:03      has a past medical history of Back pain, Elevated PSA, Essential tremor, Hypercholesteremia, Neuropathy, Prostate cancer ([place]), and Sinus tachycardia (01/30/2018).   reports that he has never smoked. He has never used smokeless tobacco.  Past Surgical History:  Procedure Laterality Date   LUMBAR MICRODISCECTOMY     L4-5   RADIOACTIVE SEED IMPLANT N/A 02/24/2018   Procedure: RADIOACTIVE SEED IMPLANT/BRACHYTHERAPY IMPLANT;  Surgeon: Festus Aloe, MD;  [facility];  Service: Urology;  Laterality: N/A;   SPACE OAR INSTILLATION N/A 02/24/2018   Procedure: SPACE OAR INSTILLATION;  Surgeon: Festus Aloe, MD;  Location: [facility];  Service: Urology;  Laterality: N/A;    No Known Allergies  Immunization History  Administered Date(s) Administered   Influenza, High Dose Seasonal PF 01/28/2019   Influenza-Unspecified 01/05/2020   PFIZER(Purple Top)SARS-COV-2 Vaccination 07/23/2019, 08/17/2019   Pneumococcal-Unspecified 02/28/2015    Family History  Problem Relation  Age of Onset   Other Mother        died at 29 of natural causes   Other Father        unsure of health   Cancer Neg Hx      Current Outpatient Medications:    aspirin EC 81 MG tablet, Take 81 mg by mouth daily. Swallow whole., Disp: , Rfl:    Cholecalciferol (VITAMIN D3 PO), Take 1 tablet by mouth every 30 (thirty) days., Disp: , Rfl:    famotidine (PEPCID) 40 MG tablet, 1 tab(s), Disp: , Rfl:    metoprolol succinate (TOPROL XL) 25 MG 24 hr tablet, Take 1 tablet (25 mg total) by mouth daily., Disp: 90 tablet, Rfl: 3   metoprolol tartrate (LOPRESSOR)  100 MG tablet, Take 1 tablet (100 mg total) by mouth once for 1 dose. Take 2 hours prior to cardiac CT. Take this medication instead of your Toprol XL on the day of your scan., Disp: 1 tablet, Rfl: 0   rosuvastatin (CRESTOR) 10 MG tablet, Take 1 tablet (10 mg total) by mouth daily., Disp: 90 tablet, Rfl: 3   vitamin B-12 (CYANOCOBALAMIN) 1000 MCG tablet, Take 1,000 mcg by mouth every Monday, Wednesday, and Friday. , Disp: , Rfl:       Objective:   There were no vitals filed for this visit.  Estimated body mass index is 20.72 kg/m as calculated from the following:   Height as of 12/28/20: '5\' 6"'$  (1.676 m).   Weight as of 12/28/20: 128 lb 6.4 oz (58.2 kg).  '@WEIGHTCHANGE'$ @  There were no vitals filed for this visit.  Sounded normal on the phoneSounded normal    Assessment:       ICD-10-CM   1. IPF (idiopathic pulmonary fibrosis) ([place])  J84.112     2. Weight loss, non-intentional  R63.4     3. Diarrhea due to drug  K52.1     4. Chronic cough  R05.3          Plan:     Patient Instructions  IPF (idiopathic pulmonary fibrosis) (HCC) Diarrhea and weight loss due to drug   -Clinically disease is worse based on cough worsening and CT scan worsening - Gained weight due after stopping cough   Plan -Put ofev on allergy list - weight loss and diarrhea - star esbriet 2 pills three times daily max (not the full 3 pills x 3 times  daily)  - aim to slow down fibrosis   - starting 1 pills three times daily for 1 week  -> then 2 pills three times daily to maintain  - take with food  - space atleast 5-6 hours with food  - set appt with [facility] Saliva (face to face) for esbriet counseling - bring [name] with you  - do spiro/dlco before next visit  - in future discuss again resaerch as care option  Weight loss, non-intentional  - resolved after stoppng ofev  Plan -monitor for weight loss with esriet  COUGH   - likely due to IPF and cough neuropathy - noted you have seen ENT and having swallow evaluation - medicine from [place] working  Plan  - bring [place] medicine bottle at next visit  - can discuss gabapentin at next visit  - will get  update about ENT visit at next visit   Follow-up - see devki for esbriet counseling  - do PFT before seeing Dr Chase Caller next -30 min face to fac ewith Dr Chase Caller 02/06/21 before [place] trip   (Telephone visit - Level 03 visit: Estb 21-30 for this visit type which was visit type: telephone visit in total care time and counseling or/and coordination of care by this undersigned MD - Dr Brand Males. This includes one or more of the following for care delivered on 01/11/2021 same day: pre-charting, chart review, note writing, documentation discussion of test results, diagnostic or treatment recommendations, prognosis, risks and benefits of management options, instructions, education, compliance or risk-factor reduction. It excludes time spent by the [place] or office staff in the care of the patient. Actual time was 25 min. E&M code is [phone])    SIGNATURE    Dr. Brand Males, M.D., F.C.C.P,  Pulmonary and Critical Care Medicine Staff Physician, [facility] Director -  Interstitial Lung Disease  Program  Pulmonary [place] at [place], [place], [address]  Pager: [phone], If no answer or between  15:00h - 7:00h:  call [phone] Telephone: [phone]  3:10 PM 01/11/2021

## 2021-01-11 NOTE — Patient Instructions (Addendum)
IPF (idiopathic pulmonary fibrosis) (HCC) Diarrhea and weight loss due to drug   -Clinically disease is worse based on cough worsening and CT scan worsening - Gained weight due after stopping cough   Plan -Put ofev on allergy list - weight loss and diarrhea - star esbriet 2 pills three times daily max (not the full 3 pills x 3 times daily)  - aim to slow down fibrosis   - starting 1 pills three times daily for 1 week  -> then 2 pills three times daily to maintain  - take with food  - space atleast 5-6 hours with food  - set appt with [facility]iva (face to face) for esbriet counseling - bring [name] with you  - do spiro/dlco before next visit  - in future discuss again resaerch as care option  Weight loss, non-intentional  - resolved after stoppng ofev  Plan -monitor for weight loss with esriet  COUGH   - likely due to IPF and cough neuropathy - noted you have seen ENT and having swallow evaluation - medicine from [place] working  Plan  - bring [place] medicine bottle at next visit  - can discuss gabapentin at next visit  - will get  update about ENT visit at next visit   Follow-up - see devki for esbriet counseling  - do PFT before seeing Dr Chase Caller next -30 min face to fac ewith Dr Chase Caller 02/06/21 before [place] trip

## 2021-01-11 NOTE — Telephone Encounter (Signed)
Please start Esbriet BIV.  Dose: '267mg'$  three times daily x 1 week then 534 mg three times daily (this will be his maintenance dose)  Dx: IPF [id number])  Previously tried therapies: Ofev - weight loss and diarrhea  Will complete patient assistance paperwork at his appointment with pharmacy clinic on 01/15/21. Placed in "PAP pending info" folder  Knox Saliva, PharmD, MPH, BCPS Clinical Pharmacist (Rheumatology and Pulmonology)

## 2021-01-12 NOTE — Telephone Encounter (Signed)
Submitted a Prior Authorization request to [employer] for ESBRIET via CoverMyMeds. Will update once we receive a response.   Key: [id number]

## 2021-01-15 ENCOUNTER — Ambulatory Visit: Payer: Medicare Other | Admitting: Pharmacist

## 2021-01-15 ENCOUNTER — Other Ambulatory Visit (HOSPITAL_COMMUNITY): Payer: Self-pay

## 2021-01-15 ENCOUNTER — Other Ambulatory Visit: Payer: Self-pay

## 2021-01-15 ENCOUNTER — Other Ambulatory Visit: Payer: Self-pay | Admitting: Otolaryngology

## 2021-01-15 DIAGNOSIS — Z7189 Other specified counseling: Secondary | ICD-10-CM

## 2021-01-15 DIAGNOSIS — Z79899 Other long term (current) drug therapy: Secondary | ICD-10-CM

## 2021-01-15 DIAGNOSIS — R1310 Dysphagia, unspecified: Secondary | ICD-10-CM

## 2021-01-15 NOTE — Telephone Encounter (Addendum)
Received notification from [facility] regarding a prior authorization for PIRFENIDONE '267mg'$ . Authorization has been APPROVED from 01/12/21 to 01/12/22.   Per tes claim, copay for 30 day supply is [phone] (copay pushes patients into donut hole)  Authorization # [id number]  Will have to pursue patient assistance through [place]. Will discuss at [place] today  Knox Saliva, PharmD, MPH, BCPS Clinical Pharmacist (Rheumatology and Pulmonology)

## 2021-01-15 NOTE — Progress Notes (Signed)
Subjective:  [facility] presents today to [facility] with his son, Jeff Willis, to see pharmacy team for Esbriet new start.   Patient was last seen and referred by Jeff [facility] on 01/11/21.  Pertinent past medical history includes IPF, PAD, GERD. Prior therapy includes Ofev - has had progressive weight loss, decreased appetite, and persistent diarrhea. He started Ofev and lost at least 10 pounds of weight though weight loss was occurring prior to starting.  He is flying to [place] on 02/19/21 and will need to have medication on hand prior to his trip.  History of elevated LFTs: No History of diarrhea, nausea, vomiting: Yes - while taking Ofev  Objective: Allergies  Allergen Reactions   Ofev [Nintedanib] Other (See Comments)    Weight loss    Outpatient Encounter Medications as of 01/15/2021  Medication Sig   aspirin EC 81 MG tablet Take 81 mg by mouth daily. Swallow whole.   Cholecalciferol (VITAMIN D3 PO) Take 1 tablet by mouth every 30 (thirty) days.   famotidine (PEPCID) 40 MG tablet 1 tab(s)   metoprolol succinate (TOPROL XL) 25 MG 24 hr tablet Take 1 tablet (25 mg total) by mouth daily.   metoprolol tartrate (LOPRESSOR) 100 MG tablet Take 1 tablet (100 mg total) by mouth once for 1 dose. Take 2 hours prior to cardiac CT. Take this medication instead of your Toprol XL on the day of your scan.   rosuvastatin (CRESTOR) 10 MG tablet Take 1 tablet (10 mg total) by mouth daily.   vitamin B-12 (CYANOCOBALAMIN) 1000 MCG tablet Take 1,000 mcg by mouth every Monday, Wednesday, and Friday.    No facility-administered encounter medications on file as of 01/15/2021.     Immunization History  Administered Date(s) Administered   Influenza, High Dose Seasonal PF 01/28/2019   Influenza-Unspecified 01/05/2020   PFIZER(Purple Top)SARS-COV-2 Vaccination 07/23/2019, 08/17/2019   Pneumococcal-Unspecified 02/28/2015    PFT's No results found for: FEV1, FVC, FEV1FVC, TLC, DLCO    CMP      Component Value Date/Time   NA 142 11/30/2020 0843   K 4.6 11/30/2020 0843   CL 103 11/30/2020 0843   CO2 25 11/30/2020 0843   GLUCOSE 87 11/30/2020 0843   GLUCOSE 96 01/17/2020 1303   BUN 10 11/30/2020 0843   CREATININE 1.00 11/30/2020 0843   CALCIUM 9.9 11/30/2020 0843   PROT 7.4 11/30/2020 0843   ALBUMIN 4.1 11/30/2020 0843   AST 17 11/30/2020 0843   ALT 11 11/30/2020 0843   ALKPHOS 62 11/30/2020 0843   BILITOT 0.5 11/30/2020 0843   GFRNONAA >60 02/17/2018 1021   GFRAA >60 02/17/2018 1021    CBC    Component Value Date/Time   WBC 7.4 11/30/2020 0843   WBC 8.9 01/17/2020 1303   RBC 4.49 11/30/2020 0843   RBC 4.75 01/17/2020 1303   HGB 14.7 11/30/2020 0843   HCT 43.1 11/30/2020 0843   PLT 188 11/30/2020 0843   MCV 96 11/30/2020 0843   MCH 32.7 11/30/2020 0843   MCH 32.4 02/17/2018 1021   MCHC 34.1 11/30/2020 0843   MCHC 34.1 01/17/2020 1303   RDW 11.5 (L) 11/30/2020 0843   LYMPHSABS 2.6 09/07/2019 1129   MONOABS 0.8 09/07/2019 1129   EOSABS 0.1 09/07/2019 1129   BASOSABS 0.0 09/07/2019 1129     LFT's Hepatic Function Latest Ref Rng & Units 11/30/2020 03/01/2020 01/17/2020  Total Protein 6.0 - 8.5 g/dL 7.4 7.2 8.0  Albumin 3.7 - 4.7 g/dL 4.1 3.9 4.5  AST 0 -  40 IU/L 17 18 22   ALT 0 - 44 IU/L 11 12 16   Alk Phosphatase 44 - 121 IU/L 62 56 55  Total Bilirubin 0.0 - 1.2 mg/dL 0.5 0.6 0.5  Bilirubin, Direct 0.0 - 0.3 mg/dL - 0.1 0.1    HRCT (01/04/21) : Progressively worsening pulmonary fibrosis with appearance of the lungs which is now considered diagnostic of usual interstitial pneumonia (UIP) per current ATS guidelines, as above.  Assessment and Plan  Esbriet Medication Management Thoroughly counseled patient on the efficacy, mechanism of action, dosing, administration, adverse effects, and monitoring parameters of Esbriet.  Patient verbalized understanding. Patient education handout provided.   Goals of Therapy: Will not stop or reverse the progression of ILD. It  will slow the progression of ILD.   Dosing: Starting dose will be Esbriet 267 mg 1 tablet three times daily for 7 days, then 2 tablets three times daily thereafter .  Stressed the importance of taking with meals and space at least 5-6 hours apart to minimize stomach upset.  He is concerned about being able to swallow tablets since he has some difficulty with swallowing tablets.  Adverse Effects: Nausea, vomiting, diarrhea, weight loss Abdominal pain GERD Sun sensitivity/rash - patient advised to wear sunscreen when exposed to sunlight Dizziness Fatigue  Monitoring: Monitor for diarrhea, nausea and vomiting, GI perforation, hepatotoxicity  Monitor LFTs - baseline, monthly for first 6 months, then every 3 months routinely CBC w differential at baseline and every 3 months routinely Called PCP [facility] office for most recent lab results that were drawn on 01/12/21  Access: Approval of Esbriet through: insurance (copay is more than $1800 as patient will be pushed into donut hole). Application for [facility] patient assistance completed by patient and will be submitted today. He will be traveling to [place] on 02/19/21 and pharmacy team will ensure that response is received from [place] as soon as possible.  Medication Reconciliation A drug regimen assessment was performed, including review of allergies, interactions, disease-state management, dosing and immunization history. Medications were reviewed with the patient, including name, instructions, indication, goals of therapy, potential side effects, importance of adherence, and safe use.  Patient requested to restart of pantoprazole. He was previously taking pantoprazole 40mg  twice daily in combination with famotidine which had raised concern about osteoporosis risk from GI, Dr. Collene Willis. Per patient's primary care, the quality of life and symptom control require a risk v benefit assessment. Even at pantoprazole 40mg  once daily, patient reported  increased cough.  Will discuss with Jeff [facility]  Immunizations Patient is indicated for the influenzae, pneumonia, and shingles vaccinations. Patient has received 2 COVID19 vaccines.  This appointment required 60 minutes of patient care (this includes precharting, chart review, review of results, face-to-face care, etc.).  Thank you for involving pharmacy to assist in providing this patient's care.    Knox Saliva, PharmD, MPH, BCPS Clinical Pharmacist (Rheumatology and Pulmonology)

## 2021-01-16 NOTE — Telephone Encounter (Signed)
Lawyer received. Submitted Patient Assistance Application to Angie for [place] along with provider portion, PA and income documents. Will update patient when we receive a response.  Fax# [phone] Phone# [phone]

## 2021-01-17 ENCOUNTER — Telehealth: Payer: Self-pay | Admitting: Pharmacist

## 2021-01-17 NOTE — Telephone Encounter (Signed)
Received fax from [facility] (Dr. Andrew Au office) for patient's labs drawn on 01/12/21  CMP:  Glucose 81 BUN 9 Scr 0.99 GFR 77 Na 138 K 4.1 Cl 103 CO2 29 AG 10.1 Calcium 10 Albumin 4.1 T bili 0.6 ALP 47 AST 18 ALT 12  Lipid panel: Cholesterol 124 Trig 47 HDL 61 Non-HDL 63 LDL 52 Chol/HDL ratio 2.0  Vit B12 662  Vitamin D 25(OH) 85.1

## 2021-01-18 NOTE — Telephone Encounter (Signed)
Per rep at  [facility] patient has been approved for ESBRIET patient assistance indefinitely, assuming there are no major changes with financial situations or household size that would disqualify patient. GPF will check in yearly to verify these factors. For additional refills after the first, patient will need to contact [facility] whenever refills are required.   At this time we are awaiting faxed confirmation to be received and then sent to scan center for retention of documentation.

## 2021-01-19 ENCOUNTER — Ambulatory Visit
Admission: RE | Admit: 2021-01-19 | Discharge: 2021-01-19 | Disposition: A | Discharge status: Home / Self Care | Payer: Medicare Other | Source: Ambulatory Visit | Attending: Otolaryngology | Admitting: Otolaryngology

## 2021-01-19 DIAGNOSIS — R1310 Dysphagia, unspecified: Secondary | ICD-10-CM

## 2021-01-24 MED ORDER — PIRFENIDONE 267 MG PO TABS: 534.0000 mg | ORAL_TABLET | Freq: Three times a day (TID) | ORAL | 1 refills | Status: DC

## 2021-01-24 MED ORDER — PANTOPRAZOLE SODIUM 40 MG PO TBEC: 40.0000 mg | DELAYED_RELEASE_TABLET | Freq: Two times a day (BID) | ORAL | 1 refills | Status: DC

## 2021-01-24 MED ORDER — PIRFENIDONE 267 MG PO TABS: ORAL_TABLET | ORAL | 0 refills | Status: DC

## 2021-01-24 NOTE — Telephone Encounter (Signed)
Spoke with patient regarding Esbriet approval through patient assistance. He completed welcome call with [facility] last week but has not yet scheduled shipment. Provided patient with pharmacy phone number via [place] since he is out of town and also texted pharmacy phone number to his son, [name] to ensure they can schedule shipment before he leaves for [place]  Rx for pantoprazole 40mg  twice daily sent to local pharmacy. Spoke with [name] who states patient will take once daily and only increase to twice daily if needed. Sent MyChart message to advise.  Knox Saliva, PharmD, MPH, BCPS Clinical Pharmacist (Rheumatology and Pulmonology)

## 2021-01-30 NOTE — Telephone Encounter (Signed)
Per [facility], patient received Esbriet on 01/27/21 shipment. They have rx for maintenance dose on file  Knox Saliva, PharmD, MPH, BCPS Clinical Pharmacist (Rheumatology and Pulmonology)

## 2021-02-05 ENCOUNTER — Other Ambulatory Visit: Payer: Self-pay

## 2021-02-05 ENCOUNTER — Ambulatory Visit: Payer: Medicare Other | Admitting: Cardiovascular Disease

## 2021-02-05 ENCOUNTER — Ambulatory Visit (INDEPENDENT_AMBULATORY_CARE_PROVIDER_SITE_OTHER): Payer: Medicare Other | Admitting: Internal Medicine

## 2021-02-05 ENCOUNTER — Encounter: Payer: Self-pay | Admitting: Cardiovascular Disease

## 2021-02-05 DIAGNOSIS — J84112 Idiopathic pulmonary fibrosis: Secondary | ICD-10-CM | POA: Diagnosis not present

## 2021-02-05 DIAGNOSIS — I2584 Coronary atherosclerosis due to calcified coronary lesion: Secondary | ICD-10-CM

## 2021-02-05 DIAGNOSIS — J849 Interstitial pulmonary disease, unspecified: Secondary | ICD-10-CM | POA: Diagnosis not present

## 2021-02-05 DIAGNOSIS — I7121 Aneurysm of the ascending aorta, without rupture: Secondary | ICD-10-CM

## 2021-02-05 DIAGNOSIS — E785 Hyperlipidemia, unspecified: Secondary | ICD-10-CM | POA: Diagnosis not present

## 2021-02-05 DIAGNOSIS — I2510 Atherosclerotic heart disease of native coronary artery without angina pectoris: Secondary | ICD-10-CM

## 2021-02-05 DIAGNOSIS — K219 Gastro-esophageal reflux disease without esophagitis: Secondary | ICD-10-CM

## 2021-02-05 LAB — PULMONARY FUNCTION TEST
DL/VA % pred: 80 %
DL/VA: 3.49 ml/min/mmHg/L
DLCO cor % pred: 49 %
DLCO cor: 13.45 ml/min/mmHg
DLCO unc % pred: 49 %
DLCO unc: 13.45 ml/min/mmHg
FEF 25-75 Pre: 3.34 L/sec
FEF2575-%Pred-Pre: 188 %
FEV1-%Pred-Pre: 97 %
FEV1-Pre: 2.33 L
FEV1FVC-%Pred-Pre: 114 %
FEV6-Pre: 2.66 L
FVC-%Pred-Pre: 85 %
FVC-Pre: 2.67 L
Pre FEV1/FVC ratio: 87 %
Pre FEV6/FVC Ratio: 100 %

## 2021-02-05 MED ORDER — ROSUVASTATIN CALCIUM 10 MG PO TABS: 10.0000 mg | ORAL_TABLET | Freq: Every day | ORAL | 3 refills | Status: DC

## 2021-02-05 MED ORDER — METOPROLOL SUCCINATE ER 25 MG PO TB24: 25.0000 mg | ORAL_TABLET | Freq: Every day | ORAL | 3 refills | Status: DC

## 2021-02-05 NOTE — Progress Notes (Signed)
Cardiology Office Note    Date:  02/05/2021   ID:  Jeff Willis, DOB Jan 03, 1941, MRN [id number]  PCP:  Gaynelle Arabian, MD  Cardiologist:  Shelva Majestic, MD   2 month F/U cardiology evaluation initially referred through the courtesy of Dr. Gaynelle Arabian   History of Present Illness:  Jeff Willis is a 80 y.o. male who is originally from [place] but has been living in [place] for 51 years.  He has been followed by Dr. Chase Caller and is felt to have probable interstitial lung disease.  A high-resolution CT scan of his chest showed probable UIP pattern with possible early honeycombing.  He also was noted to have an ectatic 4.5 cm ascending thoracic aorta and aortic atherosclerosis involving the left main and two-vessel CAD. He recently saw Dr. [name] and has had issues with elevated heart rate that seems to have been fairly consistent over several office visits.  He was referred for an echo Doppler study on Sep 07, 2020 by Dr. Chase Caller EF of 60 to 65% without wall motion abnormalities.  Right ventricular size was normal.  There was mildly elevated pulmonary artery pressure.  Estimated RV systolic pressure was 36.6.  There was mild to moderate tricuspid regurgitation.  His aortic valve was trileaflet and there was no stenosis.  When I saw him for initial evaluation on November 29, 2020 he admitted to a longstanding history of increased heart rate which often was contributed by anxiety.  He was typically drinking tea approximately 2 times per day.  He typically walks 30 minutes 4 days/week and denies any exertional anginal symptomatology.  He specifically denies any chest tightness or exertional shortness of breath.  He denies any presyncope or syncope.  He is unaware of any history of atrial fibrillation.  He had been treated with Ofev for his probable interstitial lung disease but due to significant reduction in appetite this was discontinued approximately 4-1/2 months ago.  At his  initial evaluation, I reviewed the investigations by Dr. Chase Caller as well as Dr. Mikle Bosworth.  Prior lipid studies had shown an LDL of 133.  I recommended he undergo follow-up laboratory with a comprehensive metabolic panel, magnesium level, free T3 and free T4 CBC and lipid studies.  I initiated metoprolol succinate with low-dose with plans to titrate as necessary and recommended he undergo coronary CTA for further evaluation of his CT detected coronary atherosclerosis.  Coronary CTA was done on December 08, 2020 which revealed a calcium score of 18 placing him in only the 9th percentile for age and sex matched control.  He had right dominant coronary arteries with minimal atherosclerotic plaque in the mid RCA less than 25%, distal left main less than 25%, 25 to 49% in the LAD, and less than 25% in the circumflex vessel.  Chest over read showed interstitial lung disease with possible progressive groundglass in the lower lobes.  There also was a 1 cm central left upper lobe pulmonary nodule.  Ascending aortic aneurysm was 4.2 cm.  Chemistry and thyroid laboratory were essentially normal,however, total cholesterol was 208 and LDL 129 with HDL of 60..  Presently, he feels well.  His resting pulse has improved with metoprolol succinate 25 mg daily.  He is on rosuvastatin 10 mg, and Pepcid 40 mg.  He continues to be on Esbriet for his interstitial lung disease, baby aspirin.   Past Medical History:  Diagnosis Date   Back pain    Elevated PSA    Essential tremor  Hypercholesteremia    Neuropathy    Prostate cancer ([place])    Sinus tachycardia 01/30/2018    Past Surgical History:  Procedure Laterality Date   LUMBAR MICRODISCECTOMY     L4-5   RADIOACTIVE SEED IMPLANT N/A 02/24/2018   Procedure: RADIOACTIVE SEED IMPLANT/BRACHYTHERAPY IMPLANT;  Surgeon: Festus Aloe, MD;  Location: [facility];  Service: Urology;  Laterality: N/A;   SPACE OAR INSTILLATION N/A 02/24/2018   Procedure: SPACE  OAR INSTILLATION;  Surgeon: Festus Aloe, MD;  Location: [facility];  Service: Urology;  Laterality: N/A;    Current Medications: Outpatient Medications Prior to Visit  Medication Sig Dispense Refill   aspirin EC 81 MG tablet Take 81 mg by mouth daily. Swallow whole.     Cholecalciferol (VITAMIN D3 PO) Take 1 tablet by mouth every 30 (thirty) days.     famotidine (PEPCID) 40 MG tablet 1 tab(s)     pantoprazole (PROTONIX) 40 MG tablet Take 1 tablet (40 mg total) by mouth 2 (two) times daily. 180 tablet 1   Pirfenidone (ESBRIET) 267 MG TABS Take 2 tablets (534 mg total) by mouth in the morning, at noon, and at bedtime. 540 tablet 1   Pirfenidone (ESBRIET) 267 MG TABS Take 1 tab three times daily for 7 days, then 2 tabs three times daily thereafter 159 tablet 0   rosuvastatin (CRESTOR) 10 MG tablet Take 1 tablet (10 mg total) by mouth daily. 90 tablet 3   vitamin B-12 (CYANOCOBALAMIN) 1000 MCG tablet Take 1,000 mcg by mouth every Monday, Wednesday, and Friday.      metoprolol succinate (TOPROL XL) 25 MG 24 hr tablet Take 1 tablet (25 mg total) by mouth daily. (Patient not taking: Reported on 02/05/2021) 90 tablet 3   metoprolol tartrate (LOPRESSOR) 100 MG tablet Take 1 tablet (100 mg total) by mouth once for 1 dose. Take 2 hours prior to cardiac CT. Take this medication instead of your Toprol XL on the day of your scan. 1 tablet 0   No facility-administered medications prior to visit.     Allergies:   Ofev [nintedanib]   Social History   Socioeconomic History   Marital status: Married    Spouse name: Not on file   Number of children: 3   Years of education: Masters   Highest education level: Not on file  Occupational History   Occupation: Field seismologist    Comment: working part time  Tobacco Use   Smoking status: Never   Smokeless tobacco: Never  Vaping Use   Vaping Use: Never used  Substance and Sexual Activity   Alcohol use: No   Drug use: No   Sexual  activity: Yes  Other Topics Concern   Not on file  Social History Narrative   Lives at home with his family.   Right-handed.   No caffeine use.   One daughter and two sons.   Social Determinants of Health   Financial Resource Strain: Not on file  Food Insecurity: Not on file  Transportation Needs: Not on file  Physical Activity: Not on file  Stress: Not on file  Social Connections: Not on file     He is originally from [place], [place].  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  Prior to [place] he traveled extensively.  He is married for 59 years.  He has 3 children.  There is no Bacot use or alcohol use.  He is semiretired.  Family History:  The patient's  family history includes Other in his father and mother.   His mother died at age 23.  Father died at age 46 and had pneumonia and fever.  He has a brother living at 2.  He has 1 living sister who is 32.  2 sisters are deceased 1 at age 59 after a fall fracture and another at age 93.  ROS General: Negative; No fevers, chills, or night sweats;  HEENT: Negative; No changes in vision or hearing, sinus congestion, difficulty swallowing Pulmonary: Frequent cough, felt to have UIP Cardiovascular: See HPI GI: Negative; No nausea, vomiting, diarrhea, or abdominal pain GU: Negative; No dysuria, hematuria, or difficulty voiding Musculoskeletal: Negative; no myalgias, joint pain, or weakness Hematologic/Oncology: Negative; no easy bruising, bleeding Endocrine: Negative; no heat/cold intolerance; no diabetes Neuro: Negative; no changes in balance, headaches Skin: Negative; No rashes or skin lesions Psychiatric: Negative; No behavioral problems, depression Sleep: Negative; No snoring, daytime sleepiness, hypersomnolence, bruxism, restless legs, hypnogognic hallucinations, no cataplexy Other comprehensive 14 point system review is negative.   PHYSICAL EXAM:   VS:  BP 130/76   Pulse 82   Ht 5' 6" (1.676 m)   Wt 130 lb  12.8 oz (59.3 kg)   SpO2 97%   BMI 21.11 kg/m     Repeat blood pressure by me was improved at 120/70  Wt Readings from Last 3 Encounters:  02/05/21 130 lb 12.8 oz (59.3 kg)  12/28/20 128 lb 6.4 oz (58.2 kg)  11/29/20 128 lb (58.1 kg)   General: Alert, oriented, no distress.  Skin: normal turgor, no rashes, warm and dry HEENT: Normocephalic, atraumatic. Pupils equal round and reactive to light; sclera anicteric; extraocular muscles intact;  Nose without nasal septal hypertrophy Mouth/Parynx benign; Mallinpatti scale 3 Neck: No JVD, no carotid bruits; normal carotid upstroke Lungs: Slightly decreased breath sounds, no wheezing or rales  Chest wall: without tenderness to palpitation Heart: PMI not displaced, RRR, s1 s2 normal, 1/6 systolic murmur, no diastolic murmur, no rubs, gallops, thrills, or heaves Abdomen: soft, nontender; no hepatosplenomehaly, BS+; abdominal aorta nontender and not dilated by palpation. Back: no CVA tenderness Pulses 2+ Musculoskeletal: full range of motion, normal strength, no joint deformities Extremities: no clubbing cyanosis or edema, Homan's sign negative  Neurologic: grossly nonfocal; Cranial nerves grossly wnl Psychologic: Normal mood and affect   Studies/Labs Reviewed:   EKG:  EKG is ordered today.  February 05, 2021 ECG (independently read by me): NSR at 82, Possible LA enlargement  November 29, 2020 ECG (independently read by me): Sinus tachycardia at 108; possible LAE, no ectopy  Recent Labs: BMP Latest Ref Rng & Units 11/30/2020 01/17/2020 02/17/2018  Glucose 65 - 99 mg/dL 87 96 111(H)  BUN 8 - 27 mg/dL _0 Creatinine 0.76 - 1.27 mg/dL 1.00 1.05 1.08  BUN/Creat Ratio 10 - 24 10 - -  Sodium 134 - 144 mmol/L 142 139 139  Potassium 3.5 - 5.2 mmol/L 4.6 3.9 3.9  Chloride 96 - 106 mmol/L 103 103 106  CO2 20 - 29 mmol/L _1 Calcium 8.6 - 10.2 mg/dL 9.9 10.4 9.3     Hepatic Function Latest Ref Rng & Units 11/30/2020 03/01/2020 01/17/2020   Total Protein 6.0 - 8.5 g/dL 7.4 7.2 8.0  Albumin 3.7 - 4.7 g/dL 4.1 3.9 4.5  AST 0 - 40 IU/L _2 ALT 0 - 44 IU/L _3 Alk Phosphatase 44 - 121 IU/L 62 56 55  Total Bilirubin 0.0 -  1.2 mg/dL 0.5 0.6 0.5  Bilirubin, Direct 0.0 - 0.3 mg/dL - 0.1 0.1    CBC Latest Ref Rng & Units 11/30/2020 01/17/2020 09/07/2019  WBC 3.4 - 10.8 x10E3/uL 7.4 8.9 6.9  Hemoglobin 13.0 - 17.7 g/dL 14.7 15.4 14.0  Hematocrit 37.5 - 51.0 % 43.1 45.3 41.1  Platelets 150 - 450 x10E3/uL 188 205.0 199.0   Lab Results  Component Value Date   MCV 96 11/30/2020   MCV 95.4 01/17/2020   MCV 95.9 09/07/2019   No results found for: TSH No results found for: HGBA1C   BNP No results found for: BNP  ProBNP No results found for: PROBNP   Lipid Panel     Component Value Date/Time   CHOL 203 (H) 11/30/2020 0843   TRIG 75 11/30/2020 0843   HDL 60 11/30/2020 0843   CHOLHDL 3.4 11/30/2020 0843   LDLCALC 129 (H) 11/30/2020 0843   LABVLDL 14 11/30/2020 0843     RADIOLOGY: DG ESOPHAGUS W DOUBLE CM (HD)  Result Date: 01/19/2021 CLINICAL DATA:  Dysphagia. EXAM: ESOPHOGRAM / BARIUM SWALLOW / BARIUM TABLET STUDY TECHNIQUE: Combined double contrast and single contrast examination performed using effervescent crystals, thick barium liquid, and thin barium liquid. The patient was observed with fluoroscopy swallowing a 13 mm barium sulphate tablet. FLUOROSCOPY TIME:  Fluoroscopy Time: 4 minutes and 12 seconds of low-dose pulsed fluoroscopy. Radiation Exposure Index (if provided by the fluoroscopic device): 32 mGy Number of Acquired Spot Images: 0 COMPARISON:  Chest CT 01/04/2021.  Esophagram 03/05/2019. FINDINGS: Rapid sequence imaging of the pharynx in the AP and lateral projections demonstrates no laryngeal penetration or mucosal abnormalities. No extrinsic compression on the cervical esophagus by cervical spondylosis identified. Although not obvious, there is a possible small cervical web, best seen on the AP  images. The thoracic esophageal motility is within normal limits. There is no evidence of esophageal mass, stricture or ulceration. There is no hiatal hernia. No significant gastroesophageal reflux was elicited with the water siphon test. At the conclusion of the study, a 13 mm barium tablet was administered. The patient had some difficulty swallowing the pill. Once swallowed, the pill became lodged in the distal cervical esophagus, just above the thoracic inlet. Additional barium was administered and shows no obstructing mucosal lesion in this region, although as above, there may be a small cervical web. The pill did not pass into the thoracic esophagus during approximately 3-4 minutes of intermittent fluoroscopic observation. IMPRESSION: 1. Barium tablet was retained in the distal cervical esophagus, probably by a small cervical web. No focal mucosal lesion identified. This may contribute to the patient's dysphagia. Consider endoscopic evaluation. 2. The thoracic esophagus appears normal. No esophageal reflux or ulceration identified. Electronically Signed   By: Richardean Sale M.D.   On: 01/19/2021 10:55     Additional studies/ records that were reviewed today include:   ECHO: 09/07/2020 IMPRESSIONS   1. Left ventricular ejection fraction, by estimation, is 60 to 65%. The  left ventricle has normal function. The left ventricle has no regional  wall motion abnormalities. Left ventricular diastolic parameters are  indeterminate.   2. Right ventricular systolic function is normal. The right ventricular  size is normal. There is mildly elevated pulmonary artery systolic  pressure. The estimated right ventricular systolic pressure is 29.5 mmHg.   3. The mitral valve is normal in structure. No evidence of mitral valve  regurgitation.   4. Tricuspid valve regurgitation is mild to moderate.   5. The aortic valve is  tricuspid. Aortic valve regurgitation is not  visualized. No aortic stenosis is present.    6. The inferior vena cava is normal in size with greater than 50%  respiratory variability, suggesting right atrial pressure of 3 mmHg.    CT chest high-resolution: January 13, 2020 IMPRESSION: 1. The appearance of the lungs is compatible with progressive interstitial lung disease categorized as probable usual interstitial pneumonia (UIP) per current ATS guidelines. 2. Aortic atherosclerosis, in addition to left main and 2 vessel coronary artery disease. In addition, there is mild aneurysmal dilatation of the ascending thoracic aorta (4.5 cm in diameter). Ascending thoracic aortic aneurysm. Recommend semi-annual imaging followup by CTA or MRA and referral to cardiothoracic surgery if not already obtained. This recommendation follows 2010 ACCF/AHA/AATS/ACR/ASA/SCA/SCAI/SIR/STS/SVM Guidelines for the Diagnosis and Management of Patients With Thoracic Aortic Disease. Circulation. 2010; 121: J696-V893. Aortic aneurysm NOS (ICD10-I71.9).   Aortic Atherosclerosis (ICD10-I70.0). Aortic aneurysm NOS (ICD10-I71.9).   Coronary CTA: 12/08/2020 FINDINGS: Image quality: Adequate.   Noise artifact is: Mild misregistration.   Coronary calcium score is 18, which places the patient in the 9th percentile for age and sex matched control. Comparison made to white males.   Coronary arteries: Normal coronary origins.  Right dominance.   Right Coronary Artery: Minimal atherosclerotic plaque in the mid RCA, <25% stenosis.   Left Main Coronary Artery: Minimal atherosclerotic plaque in the distal LMCA, <25% stenosis.   Left Anterior Descending Coronary Artery: Mild atherosclerotic plaque at the ostial LAD, 25-49% stenosis. Mild tubular atherosclerotic plaque in the proximal LAD, 25-49% stenosis, but appears to be approaching moderate stenosis.   Left Circumflex Artery: Minimal atherosclerotic plaque in the proximal L circumflex artery, <25% stenosis.   Aorta:   Measurements made in  double oblique technique, sinus to sinus:   Sinus of Valsalva:   R-L: 38 mm   L-Non: 37 mm   R-Non: 35 mm   Mid ascending aorta (at PA bifurcation): 43 mm, mild dilation of ascending aorta.   Mid descending aorta (at level of pulmonary veins): 21 mm   Aortic Valve: Tricuspid aortic valve, no calcifications.   Other findings:   Normal pulmonary vein drainage into the left atrium.   Normal left atrial appendage without a thrombus.   Normal size of the pulmonary artery.   IMPRESSION: 1. Mild CAD in ostial and proximal LAD, CADRADS = 2.   2. Coronary calcium score is 18, which places the patient in the 9th percentile for age and sex matched control.   3. Normal coronary origin with right dominance.   4. Mild dilation of ascending thoracic aorta, 43 mm at mid ascending aorta.   5.  Tricuspid aortic valve    ASSESSMENT:    No diagnosis found.   PLAN:  Mr.Jeff Willis  is a 80 year old gentleman who is originally from [place] and has lived in the [place] for the last 51 years.  He has had issues with chronic cough as well as weight loss.  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 Chase Caller.  He apparently did not tolerate Ofev and currently is not on treatment.  He has a history of GERD for which he is on Pepcid.  He admits to a longstanding history of elevated heart rate.  He denies any presyncope or syncope.  Oftentimes his heart rate is greater than 100.  On his most recent high-resolution CT he was found to have a dilated ascending aorta at 4.5 cm in addition  to atherosclerosis involving his left main and 2 vessels.  When I initially saw him he denied any chest pain or significant exertional dyspnea.  I  reviewed his recent laboratory as well as investigations both by Dr. Chase Caller as well as by Dr.Ehinger in detail.  Prior lipid studies in September 2021 showed a total cholesterol of 200 with LDL cholesterol 133.   TSH in April 2022 was 2.6.  Additional laboratory from my initial evaluation showed normal free T4 and free T3 levels as well as magnesium lipid studies now showed total cholesterol 208 with LDL 129 and HDL 60.  I reviewed his coronary CTA with him in detail today.  This showed only mild coronary calcification with a calcium score of 18 representing only 9 percentile for age and sex matched control.  There was mild coronary plaque ostial and proximal LAD with minimal plaque elsewhere.  There was mild dilation of his ascending aorta at 43 mm at mid ascending aorta.  He has recently been placed on rosuvastatin which he is tolerating.  I recommended that he continue to take metoprolol succinate 25 mg daily.  Subsequent laboratory should be checked in several months to see if LDL can be reduced to less than 70 and if not rosuvastatin dose should be further titrated.  He will be going to [place] on October 26 and plans to return in March 2023.  I will see him upon his return.   Medication Adjustments/Labs and Tests Ordered: Current medicines are reviewed at length with the patient today.  Concerns regarding medicines are outlined above.  Medication changes, Labs and Tests ordered today are listed in the Patient Instructions below. There are no Patient Instructions on file for this visit.   Signed, Shelva Majestic, MD  02/05/2021 2:35 PM    [facility] [address], [place], [place], [place]  [address] Phone: [phone]

## 2021-02-05 NOTE — Progress Notes (Signed)
Spirometry and Dlco done today. 

## 2021-02-05 NOTE — Patient Instructions (Signed)
Medication Instructions:  Your physician recommends that you continue on your current medications as directed. Please refer to the Current Medication list given to you today.  *If you need a refill on your cardiac medications before your next appointment, please call your pharmacy*  Follow-Up: At [facility], you and your health needs are our priority.  As part of our continuing mission to provide you with exceptional heart care, we have created designated Provider Care Teams.  These Care Teams include your primary Cardiologist (physician) and Advanced Practice Providers (APPs -  Physician Assistants and Nurse Practitioners) who all work together to provide you with the care you need, when you need it.  We recommend signing up for the patient portal called "MyChart".  Sign up information is provided on this After Visit Summary.  MyChart is used to connect with patients for Virtual Visits (Telemedicine).  Patients are able to view lab/test results, encounter notes, upcoming appointments, etc.  Non-urgent messages can be sent to your provider as well.   To learn more about what you can do with MyChart, go to [email or web address].    Your next appointment:   6 months with Dr. Claiborne Billings

## 2021-02-06 ENCOUNTER — Ambulatory Visit: Payer: Medicare Other | Admitting: Internal Medicine

## 2021-02-06 ENCOUNTER — Other Ambulatory Visit: Payer: Self-pay | Admitting: *Deleted

## 2021-02-06 VITALS — BP 124/76 | HR 97 | Ht 66.0 in | Wt 131.0 lb

## 2021-02-06 DIAGNOSIS — Z7189 Other specified counseling: Secondary | ICD-10-CM | POA: Diagnosis not present

## 2021-02-06 DIAGNOSIS — R1319 Other dysphagia: Secondary | ICD-10-CM

## 2021-02-06 DIAGNOSIS — J84112 Idiopathic pulmonary fibrosis: Secondary | ICD-10-CM

## 2021-02-06 DIAGNOSIS — R053 Chronic cough: Secondary | ICD-10-CM

## 2021-02-06 DIAGNOSIS — R634 Abnormal weight loss: Secondary | ICD-10-CM

## 2021-02-06 NOTE — Addendum Note (Signed)
Addended by: Konrad Felix L on: 02/06/2021 09:36 AM   Modules accepted: Orders

## 2021-02-06 NOTE — Progress Notes (Signed)
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   Prior cough in  2019 for several months  resolved on on some kind of powder inhaler x one week = Advair     History of Present Illness  08/04/2019  Pulmonary/ 1st Willis eval/Jeff Willis  Chief Complaint  Patient presents with   Pulmonary Consult    Referred by Dr Jeff Willis. Pt c/o non prod cough and throat clearing x 6 months.   Dyspnea:  None/ no aerobics, walking neighborhoods ok  Cough: always dry/ any candy helps if keeps in mouth but esp hard candy / does not use cough drops  Sleep: rotates on side, bed is flat/ one pillow, never wakes up cough but /win 15 min of stirring recurs daily  SABA use: never, also never prednisone Always trouble swallowing pills x lifetime  Call Dr Jeff Willis and ask what inhaler you received in 2019 as sample that seemed to help your cough = advair dpi Stop cetrizine, flonase  Prednisone 10 mg take  4 each am x 2 days,   2 each am x 2 days,  1 each am x 2 days and stop  Pantoprazole (protonix) 40 mg   Take  30-60 min before first meal of the day and Pepcid (famotidine)  20 mg one after supper  until return to Willis  GERD   Gabapentin 100 mg four times daily (ok to stop when no coughing at all for a week)  Please schedule a follow up Willis visit in 4 weeks, sooner if needed  with all medications /inhalers/ solutions in hand so we can verify exactly what you are taking. This includes all  medications from all doctors and over the counters    09/06/2019  f/u ov/Jeff Willis re: uacs on gabapenitn 100 mg three times  wolicki had tried gabapentin but failed to reveal that at last ov when I recommended it  Chief Complaint  Patient presents with   Follow-up    4 wk f/u. Wants to discuss switching Advair.   Dyspnea:  No  Cough: better just while on prednisone maybe 50-80%   Sleeping: no noct cough SABA use: none 02: none  rec Gabapentin 200 mg up to 4 x daily  Pantoprazole (protonix) 40 mg   Take  30-60 min before first meal of the day and Pepcid (famotidine)  20 mg one @  bedtime until return to Willis - this is the best way to tell whether stomach acid is contributing to your problem.   Keep the candy handy  Prednisone 10 mg  Take 4 for three days 3 for three days 2 for three days 1 for three days and stop  If not better in 2 weeks see Dr Jeff Willis for your reflux  Please remember to go to the lab department and xray dept  for your tests - we will call you with  the results when they are available.         OV 10/05/2019  Subjective:  [name], Jeff Willis , DOB: Mar 02, 1941 , age 80 y.o. , MRN: [id number] , ADDRESS: [place] [address]   10/05/2019 -   Chief Complaint  Patient presents with   Follow-up    Pt states he has been doing okay since last visit and denies any complaints.   -80 year old [name].  Referred for interstitial lung disease.  He is accompanied by his son Jeff Willis who is a Software engineer.  I know Jeff Willis  from a few years ago when heused to accompany his aunt; asthma patient of mine. Jeff Willis  has has been referred by Dr. Christinia Willis to the ILD center.  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  Up until the pandemic used to travel quite frequently.  He is to go to [place] for 3 months.  At none of the properties he is being exposed to mold.  He developed insidious onset of chronic cough over a year ago  this has been persistent.  In the last few months several cough related treatments have been tried with some improvement.  Has been on gabapentin but this made him sleepy and is currently stopped it.  Because of chronic cough which he only attributes his constant clearing of the throat he had a high-resolution CT scan of the chest that I personally visualized and shows probable UIP pattern and therefore he has been referred here.  In my personal visualization it might even be definite of UIP because there might be some early honeycombing there.  He denies any shortness of breath.  At rest he was found to be tachycardic but he says this can at baseline for him.  He will follow this up with his primary care physician.   Integrated Comprehensive( ILD Questionnaire  Symptoms:   He denies any shortness of breath.  No episodic shortness of breath.  However on the questioning he does report mild shortness of breath climbing up stairs.  In terms of his cough it started in June 2020.  It is the same since it started maybe in the last week it is slightly better.  Is moderate in intensity.  He does clear the throat.  Mostly dry cough occasionally white sputum.  Does feel a tickle in the back of his throat.  He is not waking up in the middle of the night.  Occasionally affects his voice.  No nausea no vomiting no diarrhea no wheezing.   Past Medical History : Denies any asthma COPD or heart failure rheumatoid arthritis or scleroderma or lupus or polymyositis or Sjogren's.  Denies sleep apnea.  Denies HIV denies pulmonary hypertension.  Denies diabetes or thyroid disease or stroke or seizures mononucleosis.  Denies any hepatitis or tuberculosis or kidney disease or pneumonia or blood clots or heart disease or pleurisy.  Does have on and off acid reflux for the last year   ROS: Positive for dry mouth for the last several months.  He also has some dysphagia for swallowing pills unclear for what duration.  He  also has acid reflux on PPI and H2 blocker.  Denies any Raynaud's or recurrent fever or weight loss.  No nausea no vomiting.   FAMILY HISTORY of LUNG DISEASE: * -Denies family history of COPD or asthma sarcoid or cystic fibrosis of hypersensitive pneumonitis or autoimmune disease.   EXPOSURE HISTORY: Denies any Covid.  Denies smoking cigarettes.  Denies marijuana denies smoking.  Denies IV drug use denies cocaine   HOME and HOBBY DETAILS : Single-family home [place] for the last 10 years.  Age of the home is 15 years.  No dampness.  No mildew.  No humidifier use.  No CPAP use no nebulizer machine use.  He does use a steam iron but there is no mold or mildew in it.  There is no [place] inside the house no pet birds or parakeets.  No pet gerbils no feather pillows no mold in the [facility] duct no music habits.  He does do some gardening very small garden in the house limited gardening mostly just water the garden.  No bird feather exposure no flood damage no strong mats no hot tub or Jacuzzi.  No exposure to animals at work.   OCCUPATIONAL HISTORY (122 questions) :   -Organic antigen exposure is negative inorganic exposure is negative   PULMONARY TOXICITY HISTORY (27 items):  -18-day prednisone course that ended last week.  He did have seen implantation for prostate cancer in October 2019 these were radiation seeds.      Results for LUCKY, TROTTA (MRN [id number]) as of 11/15/2019 16:27  Ref. Range 09/07/2019 11:29 10/05/2019 11:25  Anti Nuclear Antibody (ANA) Latest Ref Range: NEGATIVE   POSITIVE (A)  ANA Pattern 1 Unknown  Nuclear, Speckled (A)  ANA Titer 1 Latest Units: titer  1:40 (H)  Angiotensin-Converting Enzyme Latest Ref Range: 9 - 67 U/L  34  Cyclic Citrullin Peptide Ab Latest Units: UNITS  <16  ds DNA Ab Latest Units: IU/mL  <1  Myeloperoxidase Abs Latest Units: AI  <1.0  Serine Protease 3 Latest Units: AI  <1.0  RA Latex Turbid. Latest Ref Range: <14 IU/mL  <14   IgE (Immunoglobulin E), Serum Latest Ref Range: <OR=114 kU/L 36   SSA (Ro) (ENA) Antibody, IgG Latest Ref Range: <1.0 NEG AI  <1.0 NEG  SSB (La) (ENA) Antibody, IgG Latest Ref Range: <1.0 NEG AI  <1.0 NEG  Scleroderma (Scl-70) (ENA) Antibody, IgG Latest Ref Range: <1.0 NEG AI  <1.0 NEG     Simple Willis walk 185 feet x  3 laps goal with forehead probe 10/05/2019   O2 used ra  Number laps completed 3  Comments about pace avt  Resting Pulse Ox/HR 100% and 122/min  Final Pulse Ox/HR 100% and 143/min  Desaturated </= 88% x  Desaturated <= 3% points x  Got Tachycardic >/= 90/min x  Symptoms at end of test x  Miscellaneous comments x    Results for Jeff Willis, Jeff Willis (MRN [id number]) as of 10/05/2019 10:40  Ref. Range 09/07/2019 11:29  Eosinophils Absolute Latest Ref Range: 0.0 - 0.7 K/uL 0.1     High-resolution CT chest Sep 15, 2019: Personally visualized and agree with the findings below IMPRESSION: 1. Spectrum of findings compatible with basilar predominant fibrotic interstitial lung disease with suggestion of early honeycombing. 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 [place], Iss 5, [phone], Dec 28 2016. 2. Ectatic 4.4 cm ascending thoracic aorta. Recommend annual imaging followup by CTA or MRA. This recommendation follows 2010 ACCF/AHA/AATS/ACR/ASA/SCA/SCAI/SIR/STS/SVM Guidelines for the Diagnosis and Management of Patients with Thoracic Aortic Disease. Circulation. 2010; 121: Q676-P950. Aortic aneurysm NOS (ICD10-I71.9). 3. One vessel coronary atherosclerosis. 4. Aortic Atherosclerosis (ICD10-I70.0).     Electronically Signed   By:  Ilona Sorrel M.D.   On: 09/15/2019 14:26     Results for Jeff Willis, Jeff Willis (MRN [id number]) as of 10/05/2019 10:40  Ref. Range 09/07/2019 11:29  Sheep Sorrel IgE Latest Units: kU/L  <0.10  Pecan/Hickory Tree IgE Latest Units: kU/L <0.10  IgE (Immunoglobulin E), Serum Latest Ref Range: <OR=114 kU/L 36  Allergen, D pternoyssinus,d7 Latest Units: kU/L <0.10  Cat Dander Latest Units: kU/L <0.10  Dog Dander Latest Units: kU/L <0.10  [place] Grass Latest Units: kU/L <0.10  Johnson Grass Latest Units: kU/L <0.10  Timothy Grass Latest Units: kU/L <0.10  Cockroach Latest Units: kU/L <0.10  Aspergillus fumigatus, m3 Latest Units: kU/L <0.10  Allergen, Comm Silver Wendee Copp, t9 Latest Units: kU/L <0.10  Allergen, Cottonwood, t14 Latest Units: kU/L <0.10  Elm IgE Latest Units: kU/L <0.10  Allergen, Mulberry, t76 Latest Units: kU/L <0.10  Allergen, Oak,t7 Latest Units: kU/L <0.10  COMMON RAGWEED (SHORT) (W1) IGE Latest Units: kU/L 0.15 (H)  Allergen, Mouse Urine Protein, e78 Latest Units: kU/L <0.10  D. farinae Latest Units: kU/L <0.10  Allergen, Cedar tree, t12 Latest Units: kU/L <0.10  Box Elder IgE Latest Units: kU/L <0.10  Rough Pigweed  IgE Latest Units: kU/L <0.10    ROS - per HPI   OV 11/15/2019  Subjective:  [name], Jeff Willis , DOB: 1941-04-21 , age 18 y.o. , MRN: [id number] , ADDRESS: [place] [address]   11/15/2019 -   Chief Complaint  Patient presents with   Follow-up    Pt states the cough has been better since last visit.      HPI Bent 80 y.o. -presents with his son Jeff Willis for follow-up.  He is here to discuss test results.  In the interim he called for worsening cough.  We gave him prednisone.  He tells me the prednisone actually made the cough worse particularly towards the end.  After that the son  doubled up his PPI and also added H2 blockade and the cough went away completely.  Patient feels completely normal that he is possible that he has interstitial lung disease.  We reviewed the scan and I visualized it with him together and his son.  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  They wanted to discuss the implications  of the scan in the setting of controlled acid reflux and controlled cough but he is largely asymptomatic at this point.  Noted his serologies were negative except for trace ANA and his overnight oxygen study was normal.     OV 01/17/2020   Subjective:  [name], Jeff Willis , DOB: Aug 07, 1940, age 13 y.o. years. , MRN: [id number],  ADDRESS: [place] [address] PCP  Gaynelle Arabian, MD Providers : Treatment Team:  Attending Provider: Brand Males, MD   Chief Complaint  Patient presents with   Follow-up    pt is here to go over ct and pft    Follow-up interstitial lung disease Follow-up associated cough partially controlled with acid reflux treatment Associated weight loss present   HPI Jakim G Lindamood 80 y.o. -presents with his son Jeff Willis.  In the interim he is continue to lose weight.  He has lost another 3-4 pounds since June 2021.  His clothes are much looser.  He thinks he has been steadily losing weight.  His respiratory symptoms are not any worse although he says that his cough that seem to initially improve with doubling up of acid reflux therapy seems to persist.  At last visit in September 2020 when I thought the cough resolved but they tell me the cough only partially improved.  Nevertheless it is persisting and they are worried about it.  In addition he is worried about weight loss.  He also has poor appetite.  He is not in any pulmonary fibrosis antifibrotic's.  There is no diarrhea.  Symptom score suggest some element of anxiety since his last visit.  He has had pulmonary function test and it shows his FVC is actually better but his DLCO is actually worse.  He had high-resolution CT chest that I visualized and reviewed with him.  It shows probable UIP with possible emerging honeycombing.  I agree with the radiologist.  The radiologist actually feels that it might be a little bit worse compared to few months ago.  Of note: He has  upcoming trip to [place] between March 05, 2020 and February 2022.  He wants to make sure his care is coordinated.  He is identified a pulmonologist in his home city [place]     CT chest hig resoution 9/16?21  CLINICAL DATA:  80 year old Jeff Willis with history of shortness of breath. Evaluate for pulmonary fibrosis.   EXAM: CT CHEST WITHOUT CONTRAST   TECHNIQUE: Multidetector CT imaging of the chest was performed following the standard protocol without intravenous contrast. High resolution imaging of the lungs, as well as inspiratory and expiratory imaging, was performed.   COMPARISON:  Chest CT 09/15/2019.   FINDINGS: Cardiovascular: Heart size is normal. There is no significant pericardial fluid, thickening or pericardial calcification. There is aortic atherosclerosis, as well as atherosclerosis of the great vessels of the mediastinum and the coronary arteries, including calcified atherosclerotic plaque in the left main, left anterior descending and right coronary arteries. Dilatation of the ascending thoracic aorta measuring 4.5 cm in diameter.   Mediastinum/Nodes: No pathologically enlarged mediastinal or hilar lymph nodes. Please note that accurate exclusion of hilar adenopathy is limited on noncontrast CT scans. Esophagus is unremarkable in appearance. No axillary lymphadenopathy.   Lungs/Pleura: High-resolution images demonstrate widespread areas of ground-glass attenuation, septal thickening, thickening of the peribronchovascular interstitium, mild cylindrical bronchiectasis and peripheral bronchiolectasis. There is a suggestion of, but not definitive evidence of, early honeycombing in portions of the lungs. These findings do have a definitive craniocaudal gradient and appear slightly progressive compared to the prior study. Inspiratory and expiratory imaging is unremarkable. No acute consolidative airspace disease. No pleural effusions. No definite suspicious  appearing pulmonary nodules or masses are noted.   Upper Abdomen: Cortical calcification in the upper pole of the right kidney incidentally noted.   Musculoskeletal: There are no aggressive appearing lytic or blastic lesions noted in the visualized portions of the skeleton.   IMPRESSION: 1. The appearance of the lungs is compatible with progressive interstitial lung disease categorized as probable usual interstitial pneumonia (UIP) per current ATS guidelines. 2. Aortic atherosclerosis, in addition to left main and 2 vessel coronary artery disease. In addition, there is mild aneurysmal dilatation of the ascending thoracic aorta (4.5 cm in diameter). Ascending thoracic aortic aneurysm. Recommend semi-annual imaging followup by CTA or MRA and referral to cardiothoracic surgery if not already obtained. This recommendation follows 2010 ACCF/AHA/AATS/ACR/ASA/SCA/SCAI/SIR/STS/SVM Guidelines for the Diagnosis and Management of Patients With Thoracic Aortic Disease. Circulation. 2010; [id number]. Aortic aneurysm NOS (ICD10-I71.9).   Aortic Atherosclerosis (ICD10-I70.0). Aortic aneurysm NOS (ICD10-I71.9).     Electronically Signed   By: Vinnie Langton M.D.   On: 01/13/2020 15:15  ROS - per HPI    OV 03/01/2020  Subjective:  [name], Jeff Willis , DOB: [birth year] , age 37 y.o. , MRN: [id number] , ADDRESS: [address] [place] [address] PCP Gaynelle Arabian, MD Patient Care Team: Gaynelle Arabian, MD as PCP - General (Family Medicine)  This Provider for this visit: Treatment Team:  Attending Provider: Brand Males, MD  FU IPF Associated weight loss + pre-ofev   03/01/2020 -   Chief Complaint  Patient presents with   Follow-up    ILD, doing ok, cough has not improved     HPI Helen G Nygaard 79 y.o. -returns for follow-up.  This is after starting nintedanib.  He is currently on 100 mg twice daily for 2 weeks.  His prescription is  approved on February 02, 2020.  He accidentally got 150 mg twice daily but he sent this back.  He is going to send [place] back.  He has upcoming travel to [place] later this week he will be gone there for till February 2022.  He has supply and refill of his nintedanib on 06 March 2020.  After that he does not have nintedanib.  He and his son report that somebody will be able to go to [place] and deliver it to him.  They do not want to trust the courier service to take it there the supply chain issues.  At this point time from a respiratory standpoint he has minimal dyspnea.  He still has a cough.  The cough is not worse but after reducing the PPI dose the cough has come back.  It is somewhat troublesome.  The severity score is listed below.  They report the GI Dr. Collene Willis has reported concern about taking very high-dose of PPI and H2 blockade which actually helped the cough.  Is because of osteoporosis risk.  I agreed with that concern.  However patient is also reporting significant symptom burden.  Primary care has suggested that they follow the symptom control and quality of life issues.  I did support the primary care viewpoint as well.  Did explain to them at the end of the day as a risk-benefit ratio and with an advanced disease like pulmonary fibrosis 1 might have to consider taking a short-term again with cough control at the expense of long-term risk of side effects.  They are going to think about this.  In terms of his travel to [place].  He put me in touch with Dr. Posey Pronto.  I texted him and establish contact.  He will follow with Dr. Posey Pronto who is a pulmonologist trained in [place] but now living in [place].  Gave other travel advice are listed below.  In terms of weight loss: This is ongoing.  Even after starting the Ofev is not having any GI side effects but his weight loss continues.  He had a CT abdomen that is not showing any reason for weight loss.  Apparently had some hematuria had cystoscopy with Dr. Junious Silk.  He  has some radiation seed implants.  There is no bladder cancer.  The really frustrated by this.  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   Prior cough in  2019 for several months  resolved on on some kind of powder inhaler x one week = Advair     History of Present Illness  08/04/2019  Pulmonary/ 1st Willis eval/Jeff Willis  Chief Complaint  Patient presents with   Pulmonary Consult    Referred by Dr Jeff Willis. Pt c/o non prod cough and throat clearing x 6 months.   Dyspnea:  None/ no aerobics, walking neighborhoods ok  Cough: always dry/ any candy helps if keeps in mouth but esp hard candy / does not use cough drops  Sleep: rotates on side, bed is flat/ one pillow, never wakes up cough but /win 15 min of stirring recurs daily  SABA use: never, also never prednisone Call Dr Jeff Willis and ask what inhaler you received in 2019 as sample that seemed to help your cough = advair dpi Stop cetrizine, flonase  Prednisone 10 mg take  4 each am x 2 days,   2 each am x 2 days,  1 each am x 2 days and stop  Pantoprazole (protonix) 40 mg   Take  30-60 min before first meal of the day and Pepcid (famotidine)  20 mg one after supper  until return to Willis  GERD   Gabapentin 100 mg  four times daily (ok to stop when no coughing at all for a week)  Please schedule a follow up Willis visit in 4 weeks, sooner if needed  with all medications /inhalers/ solutions in hand so we can verify exactly what you are taking. This includes all medications from all doctors and over the counters    09/06/2019  f/u ov/Jeff Willis re: uacs on gabapenitn 100 mg three times  wolicki had tried gabapentin but failed to reveal that at last ov when I recommended it  Chief Complaint  Patient presents with   Follow-up    4 wk f/u. Wants to discuss switching Advair.   Dyspnea:  No  Cough: better just while on prednisone maybe 50-80%   Sleeping: no noct cough SABA use: none 02: none  rec Gabapentin 200 mg up to 4 x daily  Pantoprazole (protonix) 40 mg   Take  30-60 min before first meal of the day and Pepcid (famotidine)  20 mg one @  bedtime until return to Willis - this is the best way to tell whether stomach acid is contributing to your problem.   Keep the candy handy  Prednisone 10 mg  Take 4 for three days 3 for three days 2 for three days 1 for three days and stop  If not better in 2 weeks see Dr Jeff Willis for your reflux  Please remember to go to the lab department and xray dept  for your tests - we will call you with the results when they are available.         OV 10/05/2019  Subjective:  [name], Jeff Willis , DOB: [birth year] ,  age 29 y.o. , MRN: [id number] , ADDRESS: [place] [address]   10/05/2019 -   Chief Complaint  Patient presents with   Follow-up    Pt states he has been doing okay since last visit and denies any complaints.   -80 year old [place] Jeff Willis.  Referred for interstitial lung disease.  He is accompanied by his son Jeff Willis who is a Software engineer.  I know Jeff Willis  from a few years ago when heused to accompany his aunt; asthma patient of mine. Jeff Willis  has has been referred by Dr. Christinia Willis to the ILD center.  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  Up until the pandemic used to travel quite frequently.  He is to go to [place] for 3 months.  At none of the properties he is being exposed to mold.  He developed insidious onset of chronic cough over a year ago this has been persistent.  In the last few months several cough related treatments have been tried with some improvement.  Has been on gabapentin but this made him sleepy and is currently stopped it.  Because of chronic cough which he only attributes his constant clearing of the throat he had a high-resolution CT scan of the chest that I personally visualized and shows probable UIP pattern and therefore he has been referred here.  In my personal visualization it might even be definite of UIP because there might be some early honeycombing there.  He denies any shortness of breath.  At rest he was found to be tachycardic but he says this can at baseline for him.  He will follow this up with his primary care physician.  [place] [facility] Comprehensive( ILD Questionnaire  Symptoms:   He denies any shortness of breath.  No episodic shortness of breath.  However on the questioning he does report mild shortness of breath climbing up stairs.  In terms of his cough it started in June 2020.  It is the same since it started maybe in the last week it is slightly better.  Is moderate in intensity.  He does clear the throat.  Mostly dry cough occasionally white sputum.  Does feel a tickle in the back of his throat.  He is not waking up in the middle of the night.  Occasionally affects his voice.  No nausea no vomiting no diarrhea no wheezing.  SYMPTOM SCALE - ILD 10/05/2019   O2 use ra  Shortness of Breath 0 -> 5 scale with 5 being worst (score 6 If unable to do)  At rest 0  Simple tasks - showers, clothes change, eating, shaving 0  Household (dishes, doing bed, laundry) 0  Shopping 0  Walking level at own pace 0  Walking up Stairs 1  Total (30-36) Dyspnea Score  1  How bad is your cough? 2  How bad is your fatigue 0  How bad is nausea 0  How bad is vomiting?  00  How bad is diarrhea? 0  How bad is anxiety? 00  How bad is depression 0      Past Medical History : Denies any asthma COPD or heart failure rheumatoid arthritis or scleroderma or lupus or polymyositis or Sjogren's.  Denies sleep apnea.  Denies HIV denies pulmonary hypertension.  Denies diabetes or thyroid disease or stroke or seizures mononucleosis.  Denies any hepatitis or tuberculosis or kidney disease or pneumonia or blood clots or heart disease or pleurisy.  Does have on and off  acid reflux for the last year   ROS: Positive for dry mouth for the last several months.  He also has some dysphagia for swallowing pills unclear for what duration.  He also has acid reflux on PPI and H2 blocker.  Denies any Raynaud's or recurrent fever or weight loss.  No nausea no vomiting.   FAMILY HISTORY of LUNG DISEASE: * -Denies family history of COPD or asthma sarcoid or cystic fibrosis of hypersensitive pneumonitis or autoimmune disease.   EXPOSURE HISTORY: Denies any Covid.  Denies smoking cigarettes.  Denies marijuana denies smoking.  Denies IV drug use denies cocaine   HOME and HOBBY DETAILS : Single-family home [place] for the last 10 years.  Age of the home is 15 years.  No dampness.  No mildew.  No humidifier use.  No CPAP use no nebulizer machine use.  He does use a steam iron but there is no mold or mildew in it.  There is no [place] inside the house no pet birds or parakeets.  No pet gerbils no feather pillows no mold in the [facility] duct no music habits.  He does do some gardening very small garden in the house limited gardening mostly just water the garden.  No bird feather exposure no flood damage no strong mats no hot tub or Jacuzzi.  No exposure to animals at work.   OCCUPATIONAL HISTORY (122 questions) :   -Organic antigen exposure is negative inorganic exposure is  negative   PULMONARY TOXICITY HISTORY (27 items):  -18-day prednisone course that ended last week.  He did have seen implantation for prostate cancer in October 2019 these were radiation seeds.      Results for Jeff, Willis (MRN [id number]) as of 11/15/2019 16:27  Ref. Range 09/07/2019 11:29 10/05/2019 11:25  Anti Nuclear Antibody (ANA) Latest Ref Range: NEGATIVE   POSITIVE (A)  ANA Pattern 1 Unknown  Nuclear, Speckled (A)  ANA Titer 1 Latest Units: titer  1:40 (H)  Angiotensin-Converting Enzyme Latest Ref Range: 9 - 67 U/L  34  Cyclic Citrullin Peptide Ab Latest Units: UNITS  <16  ds DNA Ab Latest Units: IU/mL  <1  Myeloperoxidase Abs Latest Units: AI  <1.0  Serine Protease 3 Latest Units: AI  <1.0  RA Latex Turbid. Latest Ref Range: <14 IU/mL  <14  IgE (Immunoglobulin E), Serum Latest Ref Range: <OR=114 kU/L 36   SSA (Ro) (ENA) Antibody, IgG Latest Ref Range: <1.0 NEG AI  <1.0 NEG  SSB (La) (ENA) Antibody, IgG Latest Ref Range: <1.0 NEG AI  <1.0 NEG  Scleroderma (Scl-70) (ENA) Antibody, IgG Latest Ref Range: <1.0 NEG AI  <1.0 NEG     Simple Willis walk 185 feet x  3 laps goal with forehead probe 10/05/2019   O2 used ra  Number laps completed 3  Comments about pace avt  Resting Pulse Ox/HR 100% and 122/min  Final Pulse Ox/HR 100% and 143/min  Desaturated </= 88% x  Desaturated <= 3% points x  Got Tachycardic >/= 90/min x  Symptoms at end of test x  Miscellaneous comments x    Results for Jeff Willis, Jeff Willis (MRN [id number]) as of 10/05/2019 10:40  Ref. Range 09/07/2019 11:29  Eosinophils Absolute Latest Ref Range: 0.0 - 0.7 K/uL 0.1     High-resolution CT chest Sep 15, 2019: Personally visualized and agree with the findings below IMPRESSION: 1. Spectrum of findings compatible with basilar predominant fibrotic interstitial lung disease with suggestion of early honeycombing. Findings are  categorized as probable UIP per consensus guidelines: Diagnosis of Idiopathic  Pulmonary Fibrosis: An Official ATS/ERS/JRS/ALAT Clinical Practice Guideline. [place], Iss 5, [id number], Dec 28 2016. 2. Ectatic 4.4 cm ascending thoracic aorta. Recommend annual imaging followup by CTA or MRA. This recommendation follows 2010 ACCF/AHA/AATS/ACR/ASA/SCA/SCAI/SIR/STS/SVM Guidelines for the Diagnosis and Management of Patients with Thoracic Aortic Disease. Circulation. 2010; [id number]. Aortic aneurysm NOS (ICD10-I71.9). 3. One vessel coronary atherosclerosis. 4. Aortic Atherosclerosis (ICD10-I70.0).     Electronically Signed   By: Ilona Sorrel M.D.   On: 09/15/2019 14:26     Results for Jeff Willis, Jeff Willis (MRN [id number]) as of 10/05/2019 10:40  Ref. Range 09/07/2019 11:29  Sheep Sorrel IgE Latest Units: kU/L <0.10  Pecan/Hickory Tree IgE Latest Units: kU/L <0.10  IgE (Immunoglobulin E), Serum Latest Ref Range: <OR=114 kU/L 36  Allergen, D pternoyssinus,d7 Latest Units: kU/L <0.10  Cat Dander Latest Units: kU/L <0.10  Dog Dander Latest Units: kU/L <0.10  [place] Grass Latest Units: kU/L <0.10  Johnson Grass Latest Units: kU/L <0.10  Timothy Grass Latest Units: kU/L <0.10  Cockroach Latest Units: kU/L <0.10  Aspergillus fumigatus, m3 Latest Units: kU/L <0.10  Allergen, Comm Silver Wendee Copp, t9 Latest Units: kU/L <0.10  Allergen, Cottonwood, t14 Latest Units: kU/L <0.10  Elm IgE Latest Units: kU/L <0.10  Allergen, Mulberry, t76 Latest Units: kU/L <0.10  Allergen, Oak,t7 Latest Units: kU/L <0.10  COMMON RAGWEED (SHORT) (W1) IGE Latest Units: kU/L 0.15 (H)  Allergen, Mouse Urine Protein, e78 Latest Units: kU/L <0.10  D. farinae Latest Units: kU/L <0.10  Allergen, Cedar tree, t12 Latest Units: kU/L <0.10  Box Elder IgE Latest Units: kU/L <0.10  Rough Pigweed  IgE Latest Units: kU/L <0.10    ROS - per HPI   OV 11/15/2019  Subjective:  [name], Jeff Willis , DOB: 05/28/1940 , age 5 y.o. , MRN: [id number] , ADDRESS:  [place] [id number]   11/15/2019 -   Chief Complaint  Patient presents with   Follow-up    Pt states the cough has been better since last visit.      HPI Geronimo 80 y.o. -presents with his son Jeff Willis for follow-up.  He is here to discuss test results.  In the interim he called for worsening cough.  We gave him prednisone.  He tells me the prednisone actually made the cough worse particularly towards the end.  After that the son  doubled up his PPI and also added H2 blockade and the cough went away completely.  Patient feels completely normal that he is possible that he has interstitial lung disease.  We reviewed the scan and I visualized it with him together and his son.  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  They wanted to discuss the implications of the scan in the setting of controlled acid reflux and controlled cough but he is largely asymptomatic at this point.  Noted his serologies were negative except for trace ANA and his overnight oxygen study was normal.     OV 01/17/2020   Subjective:  [name], Jeff Willis , DOB: 1940-11-28, age 79 y.o. years. , MRN: [id number],  ADDRESS: [place] [id number] PCP  Gaynelle Arabian, MD Providers : Treatment Team:  Attending Provider: Brand Males, MD   Chief Complaint  Patient presents with   Follow-up    pt is here to go over ct and pft    Follow-up interstitial lung disease Follow-up  associated cough partially controlled with acid reflux treatment Associated weight loss present   HPI Blong G Frieze 80 y.o. -presents with his son Jeff Willis.  In the interim he is continue to lose weight.  He has lost another 3-4 pounds since June 2021.  His clothes are much looser.  He thinks he has been steadily losing weight.  His respiratory symptoms are not any worse although he says that his cough that seem to initially improve with doubling up of acid reflux therapy seems to persist.  At last  visit in September 2020 when I thought the cough resolved but they tell me the cough only partially improved.  Nevertheless it is persisting and they are worried about it.  In addition he is worried about weight loss.  He also has poor appetite.  He is not in any pulmonary fibrosis antifibrotic's.  There is no diarrhea.  Symptom score suggest some element of anxiety since his last visit.  He has had pulmonary function test and it shows his FVC is actually better but his DLCO is actually worse.  He had high-resolution CT chest that I visualized and reviewed with him.  It shows probable UIP with possible emerging honeycombing.  I agree with the radiologist.  The radiologist actually feels that it might be a little bit worse compared to few months ago.  Of note: He has upcoming trip to [place] between March 05, 2020 and February 2022.  He wants to make sure his care is coordinated.  He is identified a pulmonologist in his home city [place]     CT chest hig resoution 9/16?21  CLINICAL DATA:  80 year old Jeff Willis with history of shortness of breath. Evaluate for pulmonary fibrosis.   EXAM: CT CHEST WITHOUT CONTRAST   TECHNIQUE: Multidetector CT imaging of the chest was performed following the standard protocol without intravenous contrast. High resolution imaging of the lungs, as well as inspiratory and expiratory imaging, was performed.   COMPARISON:  Chest CT 09/15/2019.   FINDINGS: Cardiovascular: Heart size is normal. There is no significant pericardial fluid, thickening or pericardial calcification. There is aortic atherosclerosis, as well as atherosclerosis of the great vessels of the mediastinum and the coronary arteries, including calcified atherosclerotic plaque in the left main, left anterior descending and right coronary arteries. Dilatation of the ascending thoracic aorta measuring 4.5 cm in diameter.   Mediastinum/Nodes: No pathologically enlarged mediastinal or  hilar lymph nodes. Please note that accurate exclusion of hilar adenopathy is limited on noncontrast CT scans. Esophagus is unremarkable in appearance. No axillary lymphadenopathy.   Lungs/Pleura: High-resolution images demonstrate widespread areas of ground-glass attenuation, septal thickening, thickening of the peribronchovascular interstitium, mild cylindrical bronchiectasis and peripheral bronchiolectasis. There is a suggestion of, but not definitive evidence of, early honeycombing in portions of the lungs. These findings do have a definitive craniocaudal gradient and appear slightly progressive compared to the prior study. Inspiratory and expiratory imaging is unremarkable. No acute consolidative airspace disease. No pleural effusions. No definite suspicious appearing pulmonary nodules or masses are noted.   Upper Abdomen: Cortical calcification in the upper pole of the right kidney incidentally noted.   Musculoskeletal: There are no aggressive appearing lytic or blastic lesions noted in the visualized portions of the skeleton.   IMPRESSION: 1. The appearance of the lungs is compatible with progressive interstitial lung disease categorized as probable usual interstitial pneumonia (UIP) per current ATS guidelines. 2. Aortic atherosclerosis, in addition to left main and 2 vessel coronary artery disease. In addition,  there is mild aneurysmal dilatation of the ascending thoracic aorta (4.5 cm in diameter). Ascending thoracic aortic aneurysm. Recommend semi-annual imaging followup by CTA or MRA and referral to cardiothoracic surgery if not already obtained. This recommendation follows 2010 ACCF/AHA/AATS/ACR/ASA/SCA/SCAI/SIR/STS/SVM Guidelines for the Diagnosis and Management of Patients With Thoracic Aortic Disease. Circulation. 2010; [id number]. Aortic aneurysm NOS (ICD10-I71.9).   Aortic Atherosclerosis (ICD10-I70.0). Aortic aneurysm NOS (ICD10-I71.9).      Electronically Signed   By: Vinnie Langton M.D.   On: 01/13/2020 15:15    ROS - per HPI    OV 03/01/2020  Subjective:  [name], Jeff Willis , DOB: January 21, 1941 , age 27 y.o. , MRN: [id number] , ADDRESS: [address] [place] [address] PCP Gaynelle Arabian, MD Patient Care Team: Gaynelle Arabian, MD as PCP - General (Family Medicine)  This Provider for this visit: Treatment Team:  Attending Provider: Brand Males, MD  FU IPF Associated weight loss + pre-ofev   03/01/2020 -   Chief Complaint  Patient presents with   Follow-up    ILD, doing ok, cough has not improved     HPI Tiberius G Ley 79 y.o. -returns for follow-up.  This is after starting nintedanib.  He is currently on 100 mg twice daily for 2 weeks.  His prescription is approved on February 02, 2020.  He accidentally got 150 mg twice daily but he sent this back.  He is going to send [place] back.  He has upcoming travel to [place] later this week he will be gone there for till February 2022.  He has supply and refill of his nintedanib on 06 March 2020.  After that he does not have nintedanib.  He and his son report that somebody will be able to go to [place] and deliver it to him.  They do not want to trust the courier service to take it there the supply chain issues.  At this point time from a respiratory standpoint he has minimal dyspnea.  He still has a cough.  The cough is not worse but after reducing the PPI dose the cough has come back.  It is somewhat troublesome.  The severity score is listed below.  They report the GI Dr. Collene Willis has reported concern about taking very high-dose of PPI and H2 blockade which actually helped the cough.  Is because of osteoporosis risk.  I agreed with that concern.  However patient is also reporting significant symptom burden.  Primary care has suggested that they follow the symptom control and quality of life issues.  I did support the primary care viewpoint as well.   Did explain to them at the end of the day as a risk-benefit ratio and with an advanced disease like pulmonary fibrosis 1 might have to consider taking a short-term again with cough control at the expense of long-term risk of side effects.  They are going to think about this.  In terms of his travel to [place].  He put me in touch with Dr. Posey Pronto.  I texted him and establish contact.  He will follow with Dr. Posey Pronto who is a pulmonologist trained in [place] but now living in [place].  Gave other travel advice are listed below.  In terms of weight loss: This is ongoing.  Even after starting the Ofev is not having any GI side effects but his weight loss continues.  He had a CT abdomen that is not showing any reason for weight loss.  Apparently had some hematuria had  cystoscopy with Dr. Junious Silk.  He has some radiation seed implants.  There is no bladder cancer.  The really frustrated by this.  I did pull out an article about IPF and weight loss for the son to read.- shows IPF weight loss is asssocited with worse progrnosis    ROS - per HPI    OV 07/24/2020  Subjective:  [name], Jeff Willis , DOB: 1940/11/05 , age 5 y.o. , MRN: [id number] , ADDRESS: [address] [place] [address] PCP Gaynelle Arabian, MD Patient Care Team: Gaynelle Arabian, MD as PCP - General (Family Medicine)  This Provider for this visit: Treatment Team:  Attending Provider: Brand Males, MD    07/24/2020 -   Chief Complaint  Patient presents with   Follow-up    4 mo f/u. States his breathing has been stable since last visit. Has noticed a decrease in his appetite. Increased diarrhea for the past week.    Idiopathic pulmonary fibrosis on nintedanib 100 mg twice daily -last CT September 2021 Weight loss unintentional even before nintedanib  HPI Davi G Groft 80 y.o. -returns for follow-up.  He was in [place] and returned back in early March 2022.  He is now here with his wife.  He tells me while  he was in [place] for 4 months he had a great time.  He barely had any diarrhea.  Approximately 2 months ago which is 1 month before his return he switched to an [place] version of nintedanib called NINTIB.  He was taking it for a month in [place] and did not have any problems.  Upon returning to the [place] he is starting to have diarrhea.  The diarrhea is ongoing and is 3 out of 5.  Happens 2 or 3 times a week and controlled by Imodium.  His son who is a Software engineer thinks it is because he is in the [place] version of nintedanib.  He says he spoke to the [place] pulmonologist and was told that exponentially there is no difference in diarrhea between the [place] version of nintedanib and the [place] version of nintedanib.  Diarrhea happens randomly does bother him.  He says his diet has not changed since returning to the [place].  He says he eats the same Oblong.  He does take sugar with tea in the morning.  In terms of his tachycardia it still is ongoing.  He is never had echocardiogram.  In terms of weight loss he continues to lose weight.  This weight loss started even before he was on nintedanib.  It is still ongoing.  He is lost 10 pounds since June 2021.  He is worried about this.  PFT  OV 09/22/2020  Subjective:  [name], Jeff Willis , DOB: 20-May-1940 , age 54 y.o. , MRN: [id number] , ADDRESS: [address] [place] [address] PCP Gaynelle Arabian, MD Patient Care Team: Gaynelle Arabian, MD as PCP - General (Family Medicine)  This Provider for this visit: Treatment Team:  Attending Provider: Brand Males, MD    09/22/2020 -   Chief Complaint  Patient presents with   Follow-up    2 mo f/u after PFT. States he is still struggling with diarrhea and losing weight. States his breathing has been stable since last visit.    Idiopathic pulmonary fibrosis on nintedanib 100 mg twice daily since Oct 2021   -last CT September 2021 Weight loss unintentional  even before nintedanib Cough Tachycardia NOS  HPI  Damek G Bruna 80 y.o. -returns for follow-up of all the above medical issues.  From a respiratory standpoint he continues to be stable.  He had pulmonary function test that shows continued stability/improvement.  His cough continues.  He in fact he tells me that for the last 2 or 3 years mostly when he drinks water or eats food he starts choking and coughing and clears his throat.  He has now seen ENT and the dysphagia evaluation is set up.  He is continue to lose weight.  In fact he is lost 10 pounds of weight since starting nintedanib.  The weight loss was the even before nintedanib but has accelerated after nintedanib.  He has diarrhea with the nintedanib.  This is on low-dose nintedanib.  Currently is on the [place] version of the nintedanib but he still losing weight and having the same GI symptoms.  In terms of his tachycardia he had echocardiogram and this is normal.  He says his wife is very concerned about his weight loss.  His pants are loose now.      ECHO MAY 2022   IMPRESSIONS     1. Left ventricular ejection fraction, by estimation, is 60 to 65%. The  left ventricle has normal function. The left ventricle has no regional  wall motion abnormalities. Left ventricular diastolic parameters are  indeterminate.   2. Right ventricular systolic function is normal. The right ventricular  size is normal. There is mildly elevated pulmonary artery systolic  pressure. The estimated right ventricular systolic pressure is [id number] mmHg.   3. The mitral valve is normal in structure. No evidence of mitral valve  regurgitation.   4. Tricuspid valve regurgitation is mild to moderate.   5. The aortic valve is tricuspid. Aortic valve regurgitation is not  visualized. No aortic stenosis is present.   6. The inferior vena cava is normal in size with greater than 50%  respiratory variability, suggesting right atrial pressure of 3 mmHg.      OV 01/11/2021  Subjective:  [name], Jeff Willis , DOB: 28-Nov-1940 , age 59 y.o. , MRN: [id number] , ADDRESS: [place] [id number] PCP Gaynelle Arabian, MD Patient Care Team: Gaynelle Arabian, MD as PCP - General (Family Medicine)  This Provider for this visit: Treatment Team:  Attending Provider: Brand Males, MD Type of visit: Telephone/Video Circumstance: COVID-19 national emergency Identification of patient LAITHEN ADIE with [birth year] and MRN [id number] - 2 person identifier Risks: Risks, benefits, limitations of telephone visit explained. Patient understood and verbalized agreement to proceed Anyone else on call: no one other than patient and MD Patient location: patient home This provider location: [address]; [place], [place], [place]   01/11/2021 -  IPF followup   Idiopathic pulmonary fibrosis on nintedanib 100 mg twice daily since Oct 2021   -last CT September 2021 Weight loss unintentional even before nintedanib Cough Tachycardia NOS  HPI Seiya G Konkle 80 y.o. - off ofev since may 2022 Gained weight 5#/ Minimal dyspnea to no dyspnea even with exertion. [place] in interim and had good time. Did water slide. Cough is a bit worse. Othewrise well. Enegery level goodl Though cough is mild - wants something for cough relief thought says medicine from [place] helping.  Plans to go to [place] on February 21, 2021    OV 02/06/2021  Subjective:  Patient ID: ARNEY DRACE, Jeff Willis , DOB: [birth year] , age 38 y.o. , MRN: [id number] , ADDRESS:  [address] [place] [address] PCP Gaynelle Arabian, MD Patient Care Team: Gaynelle Arabian, MD as PCP - General (Family Medicine)  This Provider for this visit: Treatment Team:  Attending Provider: Brand Males, MD    02/06/2021 -   Chief Complaint  Patient presents with   Follow-up    Esbriet follow up, cough in the morning    Idiopathic  pulmonary fibrosis on nintedanib 100 mg twice daily since Oct 2021   -last CT September 2022, pft oct 2022 -> progression on CT scan  -Stop nintedanib September 2022 due to weight loss and intolerance  -Started pirfenidone October 2022 Weight loss unintentional even before nintedanib  -Improved September/October 2022 after stopping nintedanib Cough and Dysphagia  -Normal endoscopy with Dr. Collene Willis September 2021/October 2021  -Abnormal barium study September 2022  HPI Reiley G Ropp 80 y.o. -returns for follow-up.  This is a head of his trip to [place] February 21, 2021.  He will only be back after 6 months in March /April 2023.  He tells me overall he stable.  His symptom score is stable.  Very little to no dyspnea.  He continues to have dysphagia and his chronic cough.  A year ago his endoscopy was normal.  He saw ENT because of his cough they did a barium study.  As barium study was done end of September 2022 and the suggestion of cervical web on the barium study.  He asked me to call Dr. Collene Willis his GI.  I called her.  She said she will work him in.  He might need another endoscopy to rule out cervical web.  We discussed cervical web with the help of Google images.  Currently he is only bothered by cough especially early in the morning but this cough is stable as can be seen in the symptom score.  There is no dyspnea.  He came back from [place].  He plans to go to [place] February 21, 2021 and will stay there through March April 2023.  He is agreed for a telephone visit while in [place].  He has a pulmonologist in [place].  His walking desaturation test is stable except for mild tachycardia.  No shortness of breath.  He says he had liver function test through his primary care and this was normal.  He does not want to be checked today.     SYMPTOM SCALE - ILD 10/05/2019 137 pounds 01/17/2020 134 pounds 03/01/2020 131# - start ofev oct 2021 07/24/2020 127# on ofev (nintib from [place] by [facility] x 2 months)  100mg  bid 09/22/2020 122# - on ofev 100mg  bid 01/11/2021 Tele visit - off ofev  128# not on any antifibrotic 02/06/2021 131# -esbriet x 1 week  O2 use ra ra ra ra ra  ra  Shortness of Breath 0 -> 5 scale with 5 being worst (score 6 If unable to do)        At rest 0 0 0 0 0  0  Simple tasks - showers, clothes change, eating, shaving 0 0 0 1 0  0  Household (dishes, doing bed, laundry) 0 1 1  0 0  0  Shopping 0 0 0 0   0  Walking level at own pace 0 0 0 0 0  0  Walking up Stairs 1 0 1 0 0  0  Total (30-36) Dyspnea Score 1 1 2 1  0  0  How bad is your cough? 2 3 0 2 2  2   How bad is  your fatigue 0 0 0 0 0  0  How bad is nausea 0 0 0 1 0  0  How bad is vomiting?  00 0 0 0 0  0  How bad is diarrhea? 0 0 0 3 3  0  How bad is anxiety? 00 1 1 1  0  1  How bad is depression 0 1 1 1  0  1         Simple Willis walk 185 feet x  3 laps goal with forehead probe 10/05/2019  03/01/2020  07/24/2020  09/22/2020  02/06/2021   O2 used ra ra  ra ra  Number laps completed 3   3   Comments about pace avt   Mod paced avg  Resting Pulse Ox/HR 100% and 122/min 98% and 110 1-00% and 120 100% and 109/min 99% and 89  Final Pulse Ox/HR 100% and 143/min 97% and 130 99% and 130 99% and 124/min 99% and 114  Desaturated </= 88% x x  no no  Desaturated <= 3% points x x  no no  Got Tachycardic >/= 90/min x x  ye yes  Symptoms at end of test x x  none none  Miscellaneous comments x x No dyspnea No dyspnea No dyspnea    PFT  PFT Results Latest Ref Rng & Units 02/05/2021 09/22/2020 01/17/2020 10/06/2019  FVC-Pre L 2.67 2.81 - 2.59  FVC-Predicted Pre % 85 89 83 78  Pre FEV1/FVC % % 87 87 86 86  FEV1-Pre L 2.33 2.45 2.37 2.24  FEV1-Predicted Pre % 97 102 95 89  DLCO uncorrected ml/min/mmHg 13.45 16.91 13.79 15.70  DLCO UNC% % 49 62 48 55  DLCO corrected ml/min/mmHg 13.45 16.91 13.79 15.70  DLCO COR %Predicted % 49 62 48 55  DLVA Predicted % 80 89 81 97       has a past medical history of Back pain,  Elevated PSA, Essential tremor, Hypercholesteremia, Neuropathy, Prostate cancer ([place]), and Sinus tachycardia (01/30/2018).   reports that he has never smoked. He has never used smokeless tobacco.  Past Surgical History:  Procedure Laterality Date   LUMBAR MICRODISCECTOMY     L4-5   RADIOACTIVE SEED IMPLANT N/A 02/24/2018   Procedure: RADIOACTIVE SEED IMPLANT/BRACHYTHERAPY IMPLANT;  Surgeon: Festus Aloe, MD;  [facility];  Service: Urology;  Laterality: N/A;   SPACE OAR INSTILLATION N/A 02/24/2018   Procedure: SPACE OAR INSTILLATION;  Surgeon: Festus Aloe, MD;  Location: [facility];  Service: Urology;  Laterality: N/A;    Allergies  Allergen Reactions   Ofev [Nintedanib] Other (See Comments)    Weight loss    Immunization History  Administered Date(s) Administered   Influenza, High Dose Seasonal PF 01/28/2019, 01/12/2021   Influenza-Unspecified 01/05/2020   PFIZER(Purple Top)SARS-COV-2 Vaccination 07/23/2019, 08/17/2019   Pneumococcal-Unspecified 02/28/2015   Zoster, Live 01/26/2021    Family History  Problem Relation Age of Onset   Other Mother        died at 56 of natural causes   Other Father        unsure of health   Cancer Neg Hx      Current Outpatient Medications:    aspirin EC 81 MG tablet, Take 81 mg by mouth daily. Swallow whole., Disp: , Rfl:    Cholecalciferol (VITAMIN D3 PO), Take 1 tablet by mouth every 30 (thirty) days., Disp: , Rfl:    famotidine (PEPCID) 40 MG tablet, 1 tab(s), Disp: , Rfl:    metoprolol  succinate (TOPROL XL) 25 MG 24 hr tablet, Take 1 tablet (25 mg total) by mouth daily., Disp: 90 tablet, Rfl: 3   pantoprazole (PROTONIX) 40 MG tablet, Take 1 tablet (40 mg total) by mouth 2 (two) times daily., Disp: 180 tablet, Rfl: 1   Pirfenidone (ESBRIET) 267 MG TABS, Take 2 tablets (534 mg total) by mouth in the morning, at noon, and at bedtime., Disp: 540 tablet, Rfl: 1   Pirfenidone (ESBRIET) 267  MG TABS, Take 1 tab three times daily for 7 days, then 2 tabs three times daily thereafter, Disp: 159 tablet, Rfl: 0   rosuvastatin (CRESTOR) 10 MG tablet, Take 1 tablet (10 mg total) by mouth daily., Disp: 90 tablet, Rfl: 3   vitamin B-12 (CYANOCOBALAMIN) 1000 MCG tablet, Take 1,000 mcg by mouth every Monday, Wednesday, and Friday. , Disp: , Rfl:       Objective:   Vitals:   02/06/21 0900  BP: 124/76  Pulse: 97  SpO2: 98%  Weight: 131 lb (59.4 kg)  Height: 5\' 6"  (1.676 m)    Estimated body mass index is 21.14 kg/m as calculated from the following:   Height as of this encounter: 5\' 6"  (1.676 m).   Weight as of this encounter: 131 lb (59.4 kg).  @WEIGHTCHANGE @  [employer]   02/06/21 0900  Weight: 131 lb (59.4 kg)     Physical Exam General: No distress. think Neuro: Alert and Oriented x 3. GCS 15. Speech normal Psych: Pleasant Resp:  Barrel Chest - no.  Wheeze - no, Crackles - yes at base, No overt respiratory distress CVS: Normal heart sounds. Murmurs - no Ext: Stigmata of Connective Tissue Disease - no HEENT: Normal upper airway. PEERL +. No post nasal drip        Assessment:       ICD-10-CM   1. IPF (idiopathic pulmonary fibrosis) ([place])  J84.112     2. Encounter for medication counseling  Z71.89     3. Weight loss, non-intentional  R63.4     4. Chronic cough  R05.3     5. Esophageal dysphagia  R13.19          Plan:     Patient Instructions  IPF (idiopathic pulmonary fibrosis) ([place])   -Clinically disease is worse based  CT scan worsening but PFT suggesting stability v mild worsneing only - Gained weight due after stopping ofev   Plan -continue and escalate esbriet per protocol - do spiro/dlco before April 2023 upon return from [place] - see pulmonary in [place]  - in future discuss again resaerch as care option  Weight loss, non-intentional  - resolved after stoppng ofev and gained since then. Now on esbriet x 1 week as of  02/06/2021   Plan -monitor for weight loss with esbriet  COUGH and DYSPHAGIA  - likely due to IPF and cough neuropathy but dysphagia probably playing a role - 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 but endoscopy 1 year ago was normal   Plan  - I d/w Dr Jeff Willis - she will call you for a visit   - can discuss gabapentin at next visit    Follow-up - 1 per month LFT [place] in [place]  - 1-2 months telephone visit - while you are in [place] - I can call you on whatsapp - April 2023 - do spiro/dlco and have 30 min visith with Dr Gaye Alken    Dr. Brand Males, M.D., F.C.C.P,  Pulmonary and Critical Care  Medicine Staff Physician, [place] Director - Interstitial Lung Disease  Program  Pulmonary [place] at [place], [place], [address]  Pager: [phone], If no answer or between  15:00h - 7:00h: call [phone] Telephone: [phone]  9:26 AM 02/06/2021

## 2021-02-06 NOTE — Patient Instructions (Addendum)
IPF (idiopathic pulmonary fibrosis) ([place])   -Clinically disease is worse based  CT scan worsening but PFT suggesting stability v mild worsneing only - Gained weight due after stopping ofev   Plan -continue and escalate esbriet per protocol - do spiro/dlco before April 2023 upon return from [place] - see pulmonary in [place]  - in future discuss again resaerch as care option  Weight loss, non-intentional  - resolved after stoppng ofev and gained since then. Now on esbriet x 1 week as of 02/06/2021   Plan -monitor for weight loss with esbriet  COUGH and DYSPHAGIA  - likely due to IPF and cough neuropathy but dysphagia probably playing a role - 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 but endoscopy 1 year ago was normal   Plan  - I d/w [name] - she will call you for a visit   - can discuss gabapentin at next visit    Follow-up - 1 per month LFT [place] in [place]  - 1-2 months telephone visit - while you are in [place] - I can call you on whatsapp - April 2023 - do spiro/dlco and have 30 min visith with Dr Chase Caller

## 2021-02-06 NOTE — Progress Notes (Signed)
Done    SIGNATURE    Dr. Brand Males, M.D., F.C.C.P,  Pulmonary and Critical Care Medicine Staff Physician, [place] Director - Interstitial Lung Disease  Program  Pulmonary [place] at [place], [place], [address]  NPI Number:  NPI #[id number]  Pager: [phone], If no answer  -> Check AMION or Try [phone] Telephone (clinical office): [phone] Telephone (research): [phone]  4:12 PM 02/08/2021  xxx  Pt wanted to have Rx of tussionex to take with him when he goes to [place] in case he needed it for his cough. Rx has been pended for MR to sign. Due to timing for Rx to be taken care of, routing to MR as high priority.

## 2021-02-08 MED ORDER — HYDROCOD POLST-CPM POLST ER 10-8 MG/5ML PO SUER: 5.0000 mL | Freq: Two times a day (BID) | ORAL | 0 refills | Status: DC

## 2021-02-08 NOTE — Progress Notes (Signed)
Called and spoke with pt letting him know that med had been sent and he verbalized understanding. Nothing further needed.

## 2021-02-12 ENCOUNTER — Encounter: Payer: Self-pay | Admitting: Cardiovascular Disease

## 2021-02-26 ENCOUNTER — Other Ambulatory Visit (INDEPENDENT_AMBULATORY_CARE_PROVIDER_SITE_OTHER): Payer: Medicare Other

## 2021-02-26 DIAGNOSIS — Z5181 Encounter for therapeutic drug level monitoring: Secondary | ICD-10-CM | POA: Diagnosis not present

## 2021-02-26 LAB — HEPATIC FUNCTION PANEL
ALT: 14 U/L (ref 0–53)
AST: 18 U/L (ref 0–37)
Albumin: 4 g/dL (ref 3.5–5.2)
Alkaline Phosphatase: 54 U/L (ref 39–117)
Bilirubin, Direct: 0.1 mg/dL (ref 0.0–0.3)
Total Bilirubin: 0.4 mg/dL (ref 0.2–1.2)
Total Protein: 7.2 g/dL (ref 6.0–8.3)

## 2021-07-12 ENCOUNTER — Ambulatory Visit: Payer: Medicare Other | Admitting: Cardiovascular Disease

## 2021-07-12 ENCOUNTER — Other Ambulatory Visit: Payer: Self-pay

## 2021-07-12 ENCOUNTER — Encounter: Payer: Self-pay | Admitting: Cardiovascular Disease

## 2021-07-12 VITALS — BP 109/65 | HR 74 | Ht 66.0 in | Wt 125.6 lb

## 2021-07-12 DIAGNOSIS — K219 Gastro-esophageal reflux disease without esophagitis: Secondary | ICD-10-CM

## 2021-07-12 DIAGNOSIS — E785 Hyperlipidemia, unspecified: Secondary | ICD-10-CM | POA: Diagnosis not present

## 2021-07-12 DIAGNOSIS — J84112 Idiopathic pulmonary fibrosis: Secondary | ICD-10-CM

## 2021-07-12 DIAGNOSIS — I2510 Atherosclerotic heart disease of native coronary artery without angina pectoris: Secondary | ICD-10-CM | POA: Diagnosis not present

## 2021-07-12 DIAGNOSIS — I2584 Coronary atherosclerosis due to calcified coronary lesion: Secondary | ICD-10-CM | POA: Diagnosis not present

## 2021-07-12 NOTE — Patient Instructions (Signed)
Medication Instructions:  ?Your physician recommends that you continue on your current medications as directed. Please refer to the Current Medication list given to you today.  ? ?*If you need a refill on your cardiac medications before your next appointment, please call your pharmacy* ? ?Lab Work: ?NONE ? ?Testing/Procedures: ?NONE ? ?Follow-Up: ?At [facility], you and your health needs are our priority.  As part of our continuing mission to provide you with exceptional heart care, we have created designated Provider Care Teams.  These Care Teams include your primary Cardiologist (physician) and Advanced Practice Providers (APPs -  Physician Assistants and Nurse Practitioners) who all work together to provide you with the care you need, when you need it. ? ?We recommend signing up for the patient portal called "MyChart".  Sign up information is provided on this After Visit Summary.  MyChart is used to connect with patients for Virtual Visits (Telemedicine).  Patients are able to view lab/test results, encounter notes, upcoming appointments, etc.  Non-urgent messages can be sent to your provider as well.   ?To learn more about what you can do with MyChart, go to [email or web address].   ? ?Your next appointment:   ?12 month(s) ? ?The format for your next appointment:   ?In Person ? ?Provider:   ?DR Claiborne Billings  ? ? ?

## 2021-07-12 NOTE — Progress Notes (Signed)
? ?Cardiology Office Note   ? ?Date:  07/13/2021  ? ?ID:  Jeff Willis, DOB [birth year], MRN [id number] ? ?PCP:  Gaynelle Arabian, MD  ?Cardiologist:  Shelva Majestic, MD  ? ?5 month F/U cardiology evaluation initially referred through the courtesy of Dr. Gaynelle Arabian ? ? ?History of Present Illness:  ?Jeff Willis is a 81 y.o. male who is originally from [place] but has been living in [place] for 51 years.  He has been followed by Dr. Chase Caller and is felt to have probable interstitial lung disease.  A high-resolution CT scan of his chest showed probable UIP pattern with possible early honeycombing.  He also was noted to have an ectatic 4.5 cm ascending thoracic aorta and aortic atherosclerosis involving the left main and two-vessel CAD. He recently saw Dr. [name] and has had issues with elevated heart rate that seems to have been fairly consistent over several office visits.  He was referred for an echo Doppler study on Sep 07, 2020 by Dr. Chase Caller EF of 60 to 65% without wall motion abnormalities.  Right ventricular size was normal.  There was mildly elevated pulmonary artery pressure.  Estimated RV systolic pressure was 66.5.  There was mild to moderate tricuspid regurgitation.  His aortic valve was trileaflet and there was no stenosis. ? ?When I saw him for initial evaluation on November 29, 2020 he admitted to a longstanding history of increased heart rate which often was contributed by anxiety.  He was typically drinking tea approximately 2 times per day.  He typically walks 30 minutes 4 days/week and denies any exertional anginal symptomatology.  He specifically denies any chest tightness or exertional shortness of breath.  He denies any presyncope or syncope.  He is unaware of any history of atrial fibrillation.  He had been treated with Ofev for his probable interstitial lung disease but due to significant reduction in appetite this was discontinued approximately 4-1/2 months ago.  At his  initial evaluation, I reviewed the investigations by Dr. Chase Caller as well as Dr. Mikle Bosworth.  Prior lipid studies had shown an LDL of 133.  I recommended he undergo follow-up laboratory with a comprehensive metabolic panel, magnesium level, free T3 and free T4 CBC and lipid studies.  I initiated metoprolol succinate with low-dose with plans to titrate as necessary and recommended he undergo coronary CTA for further evaluation of his CT detected coronary atherosclerosis. ? ?Coronary CTA was done on December 08, 2020 which revealed a calcium score of 18 placing him in only the 9th percentile for age and sex matched control.  He had right dominant coronary arteries with minimal atherosclerotic plaque in the mid RCA less than 25%, distal left main less than 25%, 25 to 49% in the LAD, and less than 25% in the circumflex vessel.  Chest over read showed interstitial lung disease with possible progressive groundglass in the lower lobes.  There also was a 1 cm central left upper lobe pulmonary nodule.  Ascending aortic aneurysm was 4.2 cm.  Chemistry and thyroid laboratory were essentially normal,however, total cholesterol was 208 and LDL 129 with HDL of 60..  Presently, he feels well.  His resting pulse has improved with metoprolol succinate 25 mg daily.  He is on rosuvastatin 10 mg, and Pepcid 40 mg.  He continues to be on Esbriet for his interstitial lung disease and baby aspirin. ? ?Since I last saw him on February 05, 2021 he and his wife went to [place] for 4 months.  They  returned last week.  He has continued to feel well.  He denies any chest pain or shortness of breath.  He denies palpitations, presyncope or syncope.  He continues to be active.  He is on metoprolol succinate 25 mg daily in addition to rosuvastatin 10 mg and aspirin.  He takes Protonix for GERD.  He presents for reevaluation. ? ? ?Past Medical History:  ?Diagnosis Date  ? Back pain   ? Elevated PSA   ? Essential tremor   ? Hypercholesteremia   ? Neuropathy    ? Prostate cancer ([place])   ? Sinus tachycardia 01/30/2018  ? ? ?Past Surgical History:  ?Procedure Laterality Date  ? LUMBAR MICRODISCECTOMY    ? L4-5  ? RADIOACTIVE SEED IMPLANT N/A 02/24/2018  ? Procedure: RADIOACTIVE SEED IMPLANT/BRACHYTHERAPY IMPLANT;  Surgeon: Festus Aloe, MD;  Location: [facility];  Service: Urology;  Laterality: N/A;  ? SPACE OAR INSTILLATION N/A 02/24/2018  ? Procedure: SPACE OAR INSTILLATION;  Surgeon: Festus Aloe, MD;  Location[facility];  Service: Urology;  Laterality: N/A;  ? ? ?Current Medications: ?Outpatient Medications Prior to Visit  ?Medication Sig Dispense Refill  ? aspirin EC 81 MG tablet Take 81 mg by mouth daily. Swallow whole.    ? chlorpheniramine-HYDROcodone (TUSSIONEX PENNKINETIC ER) 10-8 MG/5ML SUER Take 5 mLs by mouth 2 (two) times daily. 300 mL 0  ? Cholecalciferol (VITAMIN D3 PO) Take 1 tablet by mouth every 30 (thirty) days.    ? famotidine (PEPCID) 40 MG tablet 1 tab(s)    ? metoprolol succinate (TOPROL XL) 25 MG 24 hr tablet Take 1 tablet (25 mg total) by mouth daily. 90 tablet 3  ? pantoprazole (PROTONIX) 40 MG tablet Take 1 tablet (40 mg total) by mouth 2 (two) times daily. 180 tablet 1  ? Pirfenidone (ESBRIET) 267 MG TABS Take 2 tablets (534 mg total) by mouth in the morning, at noon, and at bedtime. 540 tablet 1  ? Pirfenidone (ESBRIET) 267 MG TABS Take 1 tab three times daily for 7 days, then 2 tabs three times daily thereafter 159 tablet 0  ? vitamin B-12 (CYANOCOBALAMIN) 1000 MCG tablet Take 1,000 mcg by mouth every Monday, Wednesday, and Friday.     ? rosuvastatin (CRESTOR) 10 MG tablet Take 1 tablet (10 mg total) by mouth daily. 90 tablet 3  ? ?No facility-administered medications prior to visit.  ?  ? ?Allergies:   Ofev [nintedanib]  ? ?Social History  ? ?Socioeconomic History  ? Marital status: Married  ?  Spouse name: Not on file  ? Number of children: 3  ? Years of education: Masters  ? Highest education  level: Not on file  ?Occupational History  ? Occupation: Field seismologist  ?  Comment: working part time  ?Tobacco Use  ? Smoking status: Never  ? Smokeless tobacco: Never  ?Vaping Use  ? Vaping Use: Never used  ?Substance and Sexual Activity  ? Alcohol use: No  ? Drug use: No  ? Sexual activity: Yes  ?Other Topics Concern  ? Not on file  ?Social History Narrative  ? Lives at home with his family.  ? Right-handed.  ? No caffeine use.  ? One daughter and two sons.  ? ?Social Determinants of Health  ? ?Financial Resource Strain: Not on file  ?Food Insecurity: Not on file  ?Transportation Needs: Not on file  ?Physical Activity: Not on file  ?Stress: Not on file  ?Social Connections: Not on file  ?  ? ?  He is originally from [place], [place].  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  Prior to [employer] he traveled extensively.  He is married for 59 years.  He has 3 children.  There is no Bacot use or alcohol use.  He is semiretired. ? ?Family History:  The patient's family history includes Other in his father and mother.  ? ?His mother died at age 45.  Father died at age 46 and had pneumonia and fever.  He has a brother living at 73.  He has 1 living sister who is 77.  2 sisters are deceased 1 at age 49 after a fall fracture and another at age 11. ? ?ROS ?General: Negative; No fevers, chills, or night sweats;  ?HEENT: Negative; No changes in vision or hearing, sinus congestion, difficulty swallowing ?Pulmonary: Frequent cough, felt to have UIP ?Cardiovascular: See HPI ?GI: Negative; No nausea, vomiting, diarrhea, or abdominal pain ?GU: Negative; No dysuria, hematuria, or difficulty voiding ?Musculoskeletal: Negative; no myalgias, joint pain, or weakness ?Hematologic/Oncology: Negative; no easy bruising, bleeding ?Endocrine: Negative; no heat/cold intolerance; no diabetes ?Neuro: Negative; no changes in balance, headaches ?Skin: Negative; No rashes or skin lesions ?Psychiatric: Negative; No behavioral problems,  depression ?Sleep: Negative; No snoring, daytime sleepiness, hypersomnolence, bruxism, restless legs, hypnogognic hallucinations, no cataplexy ?Other comprehensive 14 point system review is negative. ? ? ?[place]

## 2021-07-13 ENCOUNTER — Encounter: Payer: Self-pay | Admitting: Cardiovascular Disease

## 2021-07-18 ENCOUNTER — Telehealth: Payer: Self-pay | Admitting: Internal Medicine

## 2021-07-18 DIAGNOSIS — J849 Interstitial pulmonary disease, unspecified: Secondary | ICD-10-CM

## 2021-07-18 DIAGNOSIS — Z7189 Other specified counseling: Secondary | ICD-10-CM

## 2021-07-18 DIAGNOSIS — J84112 Idiopathic pulmonary fibrosis: Secondary | ICD-10-CM

## 2021-07-18 NOTE — Telephone Encounter (Signed)
Patient needs updated LFTs.  Future order placed.  Patient will stop by on 07/19/21 for labs. ? ?Refill for pirfenidone to be sent to [facility] once labs result. Patient is taking Esbriet '534mg'$  three times daily ? ?[place] Saliva, PharmD, MPH, BCPS ?Clinical Pharmacist (Rheumatology and Pulmonology) ?

## 2021-07-19 ENCOUNTER — Other Ambulatory Visit (INDEPENDENT_AMBULATORY_CARE_PROVIDER_SITE_OTHER): Payer: Medicare Other

## 2021-07-19 DIAGNOSIS — Z7189 Other specified counseling: Secondary | ICD-10-CM | POA: Diagnosis not present

## 2021-07-19 LAB — HEPATIC FUNCTION PANEL
ALT: 13 U/L (ref 0–53)
AST: 18 U/L (ref 0–37)
Albumin: 3.8 g/dL (ref 3.5–5.2)
Alkaline Phosphatase: 47 U/L (ref 39–117)
Bilirubin, Direct: 0.1 mg/dL (ref 0.0–0.3)
Total Bilirubin: 0.4 mg/dL (ref 0.2–1.2)
Total Protein: 6.7 g/dL (ref 6.0–8.3)

## 2021-07-19 MED ORDER — PIRFENIDONE 267 MG PO TABS: 534.0000 mg | ORAL_TABLET | Freq: Three times a day (TID) | ORAL | 1 refills | Status: DC

## 2021-07-19 NOTE — Telephone Encounter (Signed)
LFTs from today wnl ? ? ?  Latest Ref Rng & Units 07/19/2021  ?  3:01 PM 02/26/2021  ?  2:12 PM 11/30/2020  ?  8:43 AM  ?Hepatic Function  ?Total Protein 6.0 - 8.3 g/dL 6.7   7.2   7.4    ?Albumin 3.5 - 5.2 g/dL 3.8   4.0   4.1    ?AST 0 - 37 U/L $Remo'18   18   17    'GuyER$ ?ALT 0 - 53 U/L $Remo'13   14   11    'Jtiau$ ?Alk Phosphatase 39 - 117 U/L 47   54   62    ?Total Bilirubin 0.2 - 1.2 mg/dL 0.4   0.4   0.5    ?Bilirubin, Direct 0.0 - 0.3 mg/dL 0.1   0.1     ? ?Refill for Esbriet $RemoveBef'534mg'JPboShBGam$  three times daily sent to [facility] ? ?[place] Saliva, PharmD, MPH, BCPS ?Clinical Pharmacist (Rheumatology and Pulmonology) ? ?

## 2021-07-20 ENCOUNTER — Telehealth: Payer: Self-pay | Admitting: Internal Medicine

## 2021-07-20 NOTE — Telephone Encounter (Signed)
Emily: LFT normal ? ?Ffront desk: he is back from [place] but I see no appt. Last PFT Oct 2022. Pls give first avail 30 min ideally with PFT but if not just give the first avail ? ? ? ?  Latest Ref Rng & Units 02/05/2021  ? 10:44 AM 09/22/2020  ?  9:50 AM 01/17/2020  ? 10:13 AM 10/06/2019  ?  1:49 PM  ?PFT Results  ?FVC-Pre L 2.67   2.81    2.59    ?FVC-Predicted Pre % 85   89   83   78    ?Pre FEV1/FVC % % 87   87   86   86    ?FEV1-Pre L 2.33   2.45   2.37   2.24    ?FEV1-Predicted Pre % 97   102   95   89    ?DLCO uncorrected ml/min/mmHg 13.45   16.91   13.79   15.70    ?DLCO UNC% % 49   62   48   55    ?DLCO corrected ml/min/mmHg 13.45   16.91   13.79   15.70    ?DLCO COR %Predicted % 49   62   48   55    ?DLVA Predicted % 80   89   81   97    ? ? ?

## 2021-07-20 NOTE — Telephone Encounter (Signed)
Appt has been scheduled. Will close encounter. ?

## 2021-07-20 NOTE — Telephone Encounter (Signed)
Pt has been scheduled for a follow up with MR having PFT prior. Nothing further needed. ?

## 2021-08-02 ENCOUNTER — Other Ambulatory Visit: Payer: Self-pay | Admitting: Gastroenterology

## 2021-08-02 DIAGNOSIS — R1310 Dysphagia, unspecified: Secondary | ICD-10-CM

## 2021-08-03 ENCOUNTER — Ambulatory Visit
Admission: RE | Admit: 2021-08-03 | Discharge: 2021-08-03 | Disposition: A | Discharge status: Home / Self Care | Payer: Medicare Other | Source: Ambulatory Visit | Attending: Gastroenterology | Admitting: Gastroenterology

## 2021-08-03 DIAGNOSIS — R1310 Dysphagia, unspecified: Secondary | ICD-10-CM

## 2021-08-06 ENCOUNTER — Other Ambulatory Visit: Payer: Self-pay | Admitting: Gastroenterology

## 2021-08-10 ENCOUNTER — Ambulatory Visit (HOSPITAL_COMMUNITY)
Admission: RE | Admit: 2021-08-10 | Discharge: 2021-08-10 | Disposition: A | Discharge status: Home / Self Care | Payer: Medicare Other | Attending: Gastroenterology | Admitting: Gastroenterology

## 2021-08-10 ENCOUNTER — Ambulatory Visit (HOSPITAL_BASED_OUTPATIENT_CLINIC_OR_DEPARTMENT_OTHER): Payer: Medicare Other | Admitting: Certified Registered Nurse Anesthetist

## 2021-08-10 ENCOUNTER — Encounter (HOSPITAL_COMMUNITY): Payer: Self-pay | Admitting: Gastroenterology

## 2021-08-10 ENCOUNTER — Ambulatory Visit (HOSPITAL_COMMUNITY): Payer: Medicare Other | Admitting: Certified Registered Nurse Anesthetist

## 2021-08-10 ENCOUNTER — Encounter (HOSPITAL_COMMUNITY)
Admission: RE | Disposition: A | Discharge status: Home / Self Care | Payer: Self-pay | Source: Home / Self Care | Attending: Gastroenterology

## 2021-08-10 ENCOUNTER — Other Ambulatory Visit: Payer: Self-pay

## 2021-08-10 DIAGNOSIS — K222 Esophageal obstruction: Secondary | ICD-10-CM

## 2021-08-10 DIAGNOSIS — Q394 Esophageal web: Secondary | ICD-10-CM | POA: Diagnosis not present

## 2021-08-10 DIAGNOSIS — K635 Polyp of colon: Secondary | ICD-10-CM

## 2021-08-10 DIAGNOSIS — K5520 Angiodysplasia of colon without hemorrhage: Secondary | ICD-10-CM | POA: Insufficient documentation

## 2021-08-10 DIAGNOSIS — D120 Benign neoplasm of cecum: Secondary | ICD-10-CM | POA: Insufficient documentation

## 2021-08-10 DIAGNOSIS — R1310 Dysphagia, unspecified: Secondary | ICD-10-CM | POA: Insufficient documentation

## 2021-08-10 DIAGNOSIS — K7460 Unspecified cirrhosis of liver: Secondary | ICD-10-CM | POA: Diagnosis not present

## 2021-08-10 DIAGNOSIS — M1990 Unspecified osteoarthritis, unspecified site: Secondary | ICD-10-CM | POA: Diagnosis not present

## 2021-08-10 DIAGNOSIS — Q2739 Arteriovenous malformation, other site: Secondary | ICD-10-CM | POA: Insufficient documentation

## 2021-08-10 DIAGNOSIS — R933 Abnormal findings on diagnostic imaging of other parts of digestive tract: Secondary | ICD-10-CM | POA: Diagnosis present

## 2021-08-10 HISTORY — PX: ESOPHAGOGASTRODUODENOSCOPY (EGD) WITH PROPOFOL: SHX5813

## 2021-08-10 HISTORY — PX: ESOPHAGEAL DILATION: SHX303

## 2021-08-10 HISTORY — PX: POLYPECTOMY: SHX5525

## 2021-08-10 HISTORY — PX: HEMOSTASIS CLIP PLACEMENT: SHX6857

## 2021-08-10 HISTORY — PX: COLONOSCOPY WITH PROPOFOL: SHX5780

## 2021-08-10 SURGERY — ESOPHAGOGASTRODUODENOSCOPY (EGD) WITH PROPOFOL
Anesthesia: Monitor Anesthesia Care

## 2021-08-10 MED ORDER — PHENYLEPHRINE 40 MCG/ML (10ML) SYRINGE FOR IV PUSH (FOR BLOOD PRESSURE SUPPORT)
PREFILLED_SYRINGE | INTRAVENOUS | Status: DC | PRN
  Administered 2021-08-10 (×6): 80 ug via INTRAVENOUS

## 2021-08-10 MED ORDER — SODIUM CHLORIDE 0.9 % IV SOLN
INTRAVENOUS | Status: DC
Start: 2021-08-10 — End: 2021-08-10

## 2021-08-10 MED ORDER — LACTATED RINGERS IV SOLN
INTRAVENOUS | Status: DC
Start: 2021-08-10 — End: 2021-08-10

## 2021-08-10 MED ORDER — PROPOFOL 500 MG/50ML IV EMUL
INTRAVENOUS | Status: DC | PRN
  Administered 2021-08-10: 100 ug/kg/min via INTRAVENOUS

## 2021-08-10 MED ORDER — PROPOFOL 10 MG/ML IV BOLUS
INTRAVENOUS | Status: DC | PRN
  Administered 2021-08-10: 30 mg via INTRAVENOUS

## 2021-08-10 MED ORDER — LIDOCAINE 2% (20 MG/ML) 5 ML SYRINGE
INTRAMUSCULAR | Status: DC | PRN
  Administered 2021-08-10: 50 mg via INTRAVENOUS

## 2021-08-10 MED ORDER — PROPOFOL 1000 MG/100ML IV EMUL
INTRAVENOUS | Status: AC
Start: 2021-08-10 — End: ?
  Filled 2021-08-10: qty 100

## 2021-08-10 SURGICAL SUPPLY — 25 items

## 2021-08-10 NOTE — H&P (Signed)
Jeff Willis ?HPI: The patient's esophagram was positive for a cervical esophageal web that did not allow the passage of a 13 mm tablet.  He complained about dysphagia for quite some time.  A PET scan was performed in [place] recently and it was positive for a hepatic flexure lesion as well as a rectal abnormality.  These were not strong signals, but it was positive and he is here for further evaluation.  An incidental finding of mild cirrhosis with the PET scan was noted.  There was some mild nodularity. ? ?Past Medical History:  ?Diagnosis Date  ? Back pain   ? Elevated PSA   ? Essential tremor   ? Hypercholesteremia   ? Neuropathy   ? Prostate cancer ([place])   ? Sinus tachycardia 01/30/2018  ? ? ?Past Surgical History:  ?Procedure Laterality Date  ? LUMBAR MICRODISCECTOMY    ? L4-5  ? RADIOACTIVE SEED IMPLANT N/A 02/24/2018  ? Procedure: RADIOACTIVE SEED IMPLANT/BRACHYTHERAPY IMPLANT;  Surgeon: Festus Aloe, MD;  [facility];  Service: Urology;  Laterality: N/A;  ? SPACE OAR INSTILLATION N/A 02/24/2018  ? Procedure: SPACE OAR INSTILLATION;  Surgeon: Festus Aloe, MD;  Location: [facility];  Service: Urology;  Laterality: N/A;  ? ? ?Family History  ?Problem Relation Age of Onset  ? Other Mother   ?     died at 23 of natural causes  ? Other Father   ?     unsure of health  ? Cancer Neg Hx   ? ? ?Social History:  reports that he has never smoked. He has never used smokeless tobacco. He reports that he does not drink alcohol and does not use drugs. ? ?Allergies:  ?Allergies  ?Allergen Reactions  ? Ofev [Nintedanib] Other (See Comments)  ?  Weight loss  ? ? ?Medications: Scheduled: ?Continuous: ? sodium chloride    ? lactated ringers 20 mL/hr at 08/10/21 0827  ? ? ?No results found for this or any previous visit (from the past 24 hour(s)).  ? ?No results found. ? ?ROS:  As stated above in the HPI otherwise negative. ? ?Blood pressure 135/70, pulse 86, temperature  98.5 ?F (36.9 ?C), temperature source Temporal, resp. rate 11, height '5\' 6"'$  (1.676 m), weight 57 kg, SpO2 100 %.   ? ?PE: ?Gen: NAD, Alert and Oriented ?HEENT:  [place]/AT, EOMI ?Neck: Supple, no LAD ?Lungs: CTA Bilaterally ?CV: RRR without M/G/R ?ABD: Soft, NTND, +BS ?Ext: No C/C/E ? ?Assessment/Plan: ?1) Cervical esophageal web with dysphagia. ?2) Abnormal PET scan. ? ? An EGD/colonoscopy will be performed.  Limited dilation of the esophagus may be performed versus dilation of the entire esophagus.  There was the finding of cirrhosis and he needs to be assessed for any evidence of esophageal varices. ? ?Plan: ?1) EGD with dilation and colonoscopy. ? ?Nattaly Yebra D ?08/10/2021, 8:42 AM  ? ? ?  ? ?

## 2021-08-10 NOTE — Progress Notes (Deleted)
Dr. Elgie Congo made aware of patients current blood sugar. No further interventions to be done. ?

## 2021-08-10 NOTE — Op Note (Signed)
[facility] ?Patient Name: Jeff Willis ?Procedure Date: 08/10/2021 ?MRN: [id number] ?Attending MD: Carol Ada , MD ?Date of Birth: [birth year] ?CSN: [id number] ?Age: 81 ?Admit Type: Outpatient ?Procedure:                Upper GI endoscopy ?Indications:              Dysphagia ?Providers:                Carol Ada, MD, Carmie End, RN, [facility],  ?                          Technician ?Referring MD:              ?Medicines:                Propofol per Anesthesia ?Complications:            No immediate complications. ?Estimated Blood Loss:     Estimated blood loss: none. ?Procedure:                Pre-Anesthesia Assessment: ?                          - Prior to the procedure, a History and Physical  ?                          was performed, and patient medications and  ?                          allergies were reviewed. The patient's tolerance of  ?                          previous anesthesia was also reviewed. The risks  ?                          and benefits of the procedure and the sedation  ?                          options and risks were discussed with the patient.  ?                          All questions were answered, and informed consent  ?                          was obtained. Prior Anticoagulants: The patient has  ?                          taken no previous anticoagulant or antiplatelet  ?                          agents. ASA Grade Assessment: II - A patient with  ?                          mild systemic disease. After reviewing the risks  ?                          and benefits,  the patient was deemed in  ?                          satisfactory condition to undergo the procedure. ?                          - Sedation was administered by an anesthesia  ?                          professional. Deep sedation was attained. ?                          After obtaining informed consent, the endoscope was  ?                          passed under direct vision. Throughout the  ?                           procedure, the patient's blood pressure, pulse, and  ?                          oxygen saturations were monitored continuously. The  ?                          [id number]-H190 ([id number]) Olympus endoscope was introduced  ?                          through the mouth, and advanced to the second part  ?                          of duodenum. The upper GI endoscopy was  ?                          accomplished without difficulty. The patient  ?                          tolerated the procedure well. ?Scope In: ?Scope Out: ?Findings: ?     One benign-appearing, intrinsic mild stenosis was found at the  ?     cricopharyngeus. This stenosis measured 1.1 cm (inner diameter) x 2 cm  ?     (in length). The stenosis was traversed. A guidewire was placed and the  ?     scope was withdrawn. Dilation was performed with a Savary dilator with  ?     no resistance at 15 mm. The dilation site was examined following  ?     endoscope reinsertion and showed moderate mucosal disruption and  ?     complete resolution of luminal narrowing. Estimated blood loss was  ?     minimal. ?     The stomach was normal. ?     The examined duodenum was normal. ?     Multiple eccentric strictures were noted at the UES. The adult endoscope  ?     was able to navigate through this area. Savary dilation started at 12 mm  ?     and it sequentially increased to 12.8 mm, and then 14 mm. A relook  was  ?     performed and there was a mucosal break, but it was felt that further  ?     augmentation was going to benefit the the patient. A final dilation at  ?     15 mm was performed and an overall excellent mucosal disruption was  ?     achieved. All the dilations passed with ease and there was no evidence  ?     of any crepitus. Careful examination of the esophagus was performed to  ?     ensure that there was no gross evidence of esophageal varices. No  ?     varices were found and no portal HTN gastropathy was noted. ?Impression:               -  Benign-appearing esophageal stenosis. Dilated. ?                          - Normal stomach. ?                          - Normal examined duodenum. ?                          - No specimens collected. ?Moderate Sedation: ?     Not Applicable - Patient had care per Anesthesia. ?Recommendation:           - Patient has a contact number available for  ?                          emergencies. The signs and symptoms of potential  ?                          delayed complications were discussed with the  ?                          patient. Return to normal activities tomorrow.  ?                          Written discharge instructions were provided to the  ?                          patient. ?                          - Resume previous diet. ?                          - Continue present medications. ?                          - Follow up with Dr. Collene Mares in one month. ?                          - EGD with dilation PRN. ?Procedure Code(s):        --- Professional --- ?                          [phone], Esophagogastroduodenoscopy, flexible,  ?  transoral; with insertion of guide wire followed by  ?                          passage of dilator(s) through esophagus over guide  ?                          wire ?Diagnosis Code(s):        --- Professional --- ?                          K22.2, Esophageal obstruction ?                          R13.10, Dysphagia, unspecified ?CPT copyright 2019 American [employer] Association. All rights reserved. ?The codes documented in this report are preliminary and upon coder review may  ?be revised to meet current compliance requirements. ?Carol Ada, MD ?Carol Ada, MD ?08/10/2021 11:29:42 AM ?This report has been signed electronically. ?Number of Addenda: 0 ?

## 2021-08-10 NOTE — Op Note (Signed)
[facility] ?Patient Name: Jeff Willis ?Procedure Date: 08/10/2021 ?MRN: [id number] ?Attending MD: Carol Ada , MD ?Date of Birth: 02-08-1941 ?CSN: [id number] ?Age: 81 ?Admit Type: Outpatient ?Procedure:                Colonoscopy ?Indications:              Abnormal PET scan of the GI tract ?Providers:                Carol Ada, MD, Carmie End, RN, [facility],  ?                          Technician ?Referring MD:              ?Medicines:                Propofol per Anesthesia ?Complications:            No immediate complications. ?Estimated Blood Loss:     Estimated blood loss: none. ?Procedure:                Pre-Anesthesia Assessment: ?                          - Prior to the procedure, a History and Physical  ?                          was performed, and patient medications and  ?                          allergies were reviewed. The patient's tolerance of  ?                          previous anesthesia was also reviewed. The risks  ?                          and benefits of the procedure and the sedation  ?                          options and risks were discussed with the patient.  ?                          All questions were answered, and informed consent  ?                          was obtained. Prior Anticoagulants: The patient has  ?                          taken no previous anticoagulant or antiplatelet  ?                          agents. ASA Grade Assessment: II - A patient with  ?                          mild systemic disease. After reviewing the risks  ?  and benefits, the patient was deemed in  ?                          satisfactory condition to undergo the procedure. ?                          - Sedation was administered by an anesthesia  ?                          professional. Deep sedation was attained. ?                          After obtaining informed consent, the colonoscope  ?                          was passed under direct vision.  Throughout the  ?                          procedure, the patient's blood pressure, pulse, and  ?                          oxygen saturations were monitored continuously. The  ?                          PCF-HQ190L ([id number]) Olympus colonoscope was  ?                          introduced through the anus and advanced to the the  ?                          cecum, identified by appendiceal orifice and  ?                          ileocecal valve. The colonoscopy was performed  ?                          without difficulty. The patient tolerated the  ?                          procedure well. The quality of the bowel  ?                          preparation was evaluated using the BBPS [facility]  ?                          Bowel Preparation Scale) with scores of: Right  ?                          Colon = 3, Transverse Colon = 3 and Left Colon = 3  ?                          (entire mucosa seen well with no residual staining,  ?  small fragments of stool or opaque liquid). The  ?                          total BBPS score equals 9. The ileocecal valve,  ?                          appendiceal orifice, and rectum were photographed. ?Scope In: 11:02:23 AM ?Scope Out: 11:17:17 AM ?Total Procedure Duration: 0 hours 14 minutes 54 seconds  ?Findings: ?     A 3 mm polyp was found in the cecum. The polyp was sessile. The polyp  ?     was removed with a cold snare. Resection and retrieval were complete. To  ?     prevent bleeding post-intervention, one hemostatic clip was successfully  ?     placed. There was no bleeding at the end of the procedure. ?     A single large localized angiodysplastic lesion without bleeding was  ?     found at the hepatic flexure. ?     The 10 mm cold snare captured more mucosa than desired with the snare  ?     polypectomy. No bleeding was induced, however, the decision was made to  ?     deploy a hemoclip to prevent any delayed post polypectomy bleed. At the  ?     hepatic flexure a  large, but faint AVM was found. There was no evidence  ?     of any bleeding. This is probably the reason for the positive signal on  ?     the PET scan. In the rectum no pathology was identified to correlate  ?     with the PET scan. ?Impression:               - One 3 mm polyp in the cecum, removed with a cold  ?                          snare. Resected and retrieved. Clip was placed. ?                          - A single non-bleeding colonic angiodysplastic  ?                          lesion. ?Moderate Sedation: ?     Not Applicable - Patient had care per Anesthesia. ?Recommendation:           - Patient has a contact number available for  ?                          emergencies. The signs and symptoms of potential  ?                          delayed complications were discussed with the  ?                          patient. Return to normal activities tomorrow.  ?                          Written discharge instructions were provided to the  ?  patient. ?                          - Resume previous diet. ?                          - Continue present medications. ?                          - Await pathology results. ?                          - No repeat colonoscopy for surveillance is  ?                          recommended. ?Procedure Code(s):        --- Professional --- ?                          [phone], Colonoscopy, flexible; with removal of  ?                          tumor(s), polyp(s), or other lesion(s) by snare  ?                          technique ?Diagnosis Code(s):        --- Professional --- ?                          K63.5, Polyp of colon ?                          K55.20, Angiodysplasia of colon without hemorrhage ?                          R93.3, Abnormal findings on diagnostic imaging of  ?                          other parts of digestive tract ?CPT copyright 2019 American Medical Association. All rights reserved. ?The codes documented in this report are preliminary and upon  coder review may  ?be revised to meet current compliance requirements. ?Carol Ada, MD ?Carol Ada, MD ?08/10/2021 11:35:00 AM ?This report has been signed electronically. ?Number of Addenda: 0 ?

## 2021-08-10 NOTE — Transfer of Care (Signed)
Immediate Anesthesia Transfer of Care Note ? ?Patient: Jeff Willis ? ?Procedure(s) Performed: ESOPHAGOGASTRODUODENOSCOPY (EGD) WITH PROPOFOL ?COLONOSCOPY WITH PROPOFOL ?ESOPHAGEAL DILATION ?POLYPECTOMY ?HEMOSTASIS CLIP PLACEMENT ? ?Patient Location: Endoscopy Unit ? ?Anesthesia Type:MAC ? ?Level of Consciousness: awake and patient cooperative ? ?Airway & Oxygen Therapy: Patient Spontanous Breathing and Patient connected to face mask ? ?Post-op Assessment: Report given to RN and Post -op Vital signs reviewed and stable ? ?Post vital signs: Reviewed and stable ? ?Last Vitals:  ?Vitals Value Taken Time  ?BP    ?Temp    ?Pulse    ?Resp    ?SpO2    ? ? ?Last Pain:  ?Vitals:  ? 08/10/21 0812  ?TempSrc: Temporal  ?PainSc: 0-No pain  ?   ? ?  ? ?Complications: No notable events documented. ?

## 2021-08-10 NOTE — Anesthesia Preprocedure Evaluation (Addendum)
Anesthesia Evaluation  ?Patient identified by MRN, date of birth, ID band ?Patient awake ? ? ? ?Reviewed: ?Allergy & Precautions, NPO status , Patient's Chart, lab work & pertinent test results ? ?Airway ?Mallampati: II ? ?TM Distance: >3 FB ?Neck ROM: Full ? ? ? Dental ?no notable dental hx. ? ?  ?Pulmonary ? ?Interstitial lung disease ?  ?Pulmonary exam normal ?breath sounds clear to auscultation ? ? ? ? ? ? Cardiovascular ?+ Peripheral Vascular Disease  ?Normal cardiovascular exam ?Rhythm:Regular Rate:Normal ? ?EKG: NSR from 07/12/21 ? ?  ?Neuro/Psych ?Essential tremor ? Neuromuscular disease negative psych ROS  ? GI/Hepatic ?Neg liver ROS, GERD  ,dysphagia ?  ?Endo/Other  ?negative endocrine ROS ? Renal/GU ?negative Renal ROS  ?negative genitourinary ?  ?Musculoskeletal ? ?(+) Arthritis , Osteoarthritis,  sacroiliits  ? Abdominal ?  ?Peds ?negative pediatric ROS ?(+)  Hematology ?negative hematology ROS ?(+)   ?Anesthesia Other Findings ? ? Reproductive/Obstetrics ?negative OB ROS ? ?  ? ? ? ? ? ? ? ? ? ? ? ? ? ?  ?  ? ? ? ? ? ? ? ?Anesthesia Physical ?Anesthesia Plan ? ?ASA: 3 ? ?Anesthesia Plan: MAC  ? ?Post-op Pain Management: Minimal or no pain anticipated  ? ?Induction: Intravenous ? ?PONV Risk Score and Plan: Treatment may vary due to age or medical condition, Propofol infusion and TIVA ? ?Airway Management Planned: Natural Airway and Simple Face Mask ? ?Additional Equipment: None ? ?Intra-op Plan:  ? ?Post-operative Plan:  ? ?Informed Consent: I have reviewed the patients History and Physical, chart, labs and discussed the procedure including the risks, benefits and alternatives for the proposed anesthesia with the patient or authorized representative who has indicated his/her understanding and acceptance.  ? ? ? ? ? ?Plan Discussed with: CRNA, Anesthesiologist and Surgeon ? ?Anesthesia Plan Comments:   ? ? ? ? ? ?Anesthesia Quick Evaluation ? ?

## 2021-08-10 NOTE — Discharge Instructions (Signed)

## 2021-08-10 NOTE — Anesthesia Postprocedure Evaluation (Signed)
Anesthesia Post Note ? ?Patient: Jeff Willis ? ?Procedure(s) Performed: ESOPHAGOGASTRODUODENOSCOPY (EGD) WITH PROPOFOL ?COLONOSCOPY WITH PROPOFOL ?ESOPHAGEAL DILATION ?POLYPECTOMY ?HEMOSTASIS CLIP PLACEMENT ? ?  ? ?Patient location during evaluation: PACU ?Anesthesia Type: MAC ?Level of consciousness: awake and alert ?Pain management: pain level controlled ?Vital Signs Assessment: post-procedure vital signs reviewed and stable ?Respiratory status: spontaneous breathing and respiratory function stable ?Cardiovascular status: stable ?Postop Assessment: no apparent nausea or vomiting ?Anesthetic complications: no ? ? ?No notable events documented. ? ?Last Vitals:  ?Vitals:  ? 08/10/21 1140 08/10/21 1150  ?BP: (!) 106/58 (!) 116/57  ?Pulse: (!) 56 60  ?Resp: 16 16  ?Temp:    ?SpO2: 100% 98%  ?  ?Last Pain:  ?Vitals:  ? 08/10/21 1150  ?TempSrc:   ?PainSc: 0-No pain  ? ? ?  ?  ?  ?  ?  ?  ? ?Merlinda Frederick ? ? ? ? ?

## 2021-08-13 ENCOUNTER — Encounter (HOSPITAL_COMMUNITY): Payer: Self-pay | Admitting: Gastroenterology

## 2021-08-13 LAB — SURGICAL PATHOLOGY

## 2021-09-06 ENCOUNTER — Encounter: Payer: Medicare Other | Admitting: Adult Health

## 2021-09-07 ENCOUNTER — Ambulatory Visit (INDEPENDENT_AMBULATORY_CARE_PROVIDER_SITE_OTHER): Payer: Medicare Other | Admitting: Internal Medicine

## 2021-09-07 ENCOUNTER — Ambulatory Visit: Payer: Medicare Other | Admitting: Internal Medicine

## 2021-09-07 ENCOUNTER — Ambulatory Visit
Admission: RE | Admit: 2021-09-07 | Discharge: 2021-09-07 | Disposition: A | Discharge status: Home / Self Care | Payer: Self-pay | Source: Ambulatory Visit | Attending: Internal Medicine | Admitting: Internal Medicine

## 2021-09-07 ENCOUNTER — Encounter: Payer: Self-pay | Admitting: Internal Medicine

## 2021-09-07 VITALS — BP 116/74 | HR 85 | Ht 66.0 in | Wt 125.6 lb

## 2021-09-07 DIAGNOSIS — R634 Abnormal weight loss: Secondary | ICD-10-CM

## 2021-09-07 DIAGNOSIS — Z7189 Other specified counseling: Secondary | ICD-10-CM | POA: Diagnosis not present

## 2021-09-07 DIAGNOSIS — J84112 Idiopathic pulmonary fibrosis: Secondary | ICD-10-CM | POA: Diagnosis not present

## 2021-09-07 DIAGNOSIS — R1319 Other dysphagia: Secondary | ICD-10-CM

## 2021-09-07 DIAGNOSIS — R0609 Other forms of dyspnea: Secondary | ICD-10-CM | POA: Diagnosis not present

## 2021-09-07 LAB — PULMONARY FUNCTION TEST
DL/VA % pred: 72 %
DL/VA: 3.12 ml/min/mmHg/L
DLCO cor % pred: 45 %
DLCO cor: 12.24 ml/min/mmHg
DLCO unc % pred: 45 %
DLCO unc: 12.24 ml/min/mmHg
FEF 25-75 Pre: 2.95 L/sec
FEF2575-%Pred-Pre: 169 %
FEV1-%Pred-Pre: 95 %
FEV1-Pre: 2.24 L
FEV1FVC-%Pred-Pre: 111 %
FEV6-Pre: 2.62 L
FVC-%Pred-Pre: 84 %
FVC-Pre: 2.63 L
Pre FEV1/FVC ratio: 85 %
Pre FEV6/FVC Ratio: 99 %

## 2021-09-07 LAB — CBC WITH DIFFERENTIAL/PLATELET
Basophils Absolute: 0 10*3/uL (ref 0.0–0.1)
Basophils Relative: 0.5 % (ref 0.0–3.0)
Eosinophils Absolute: 0.1 10*3/uL (ref 0.0–0.7)
Eosinophils Relative: 1.1 % (ref 0.0–5.0)
HCT: 40.3 % (ref 39.0–52.0)
Hemoglobin: 13.7 g/dL (ref 13.0–17.0)
Lymphocytes Relative: 23.2 % (ref 12.0–46.0)
Lymphs Abs: 1.4 10*3/uL (ref 0.7–4.0)
MCHC: 33.9 g/dL (ref 30.0–36.0)
MCV: 96.4 fl (ref 78.0–100.0)
Monocytes Absolute: 0.7 10*3/uL (ref 0.1–1.0)
Monocytes Relative: 12 % (ref 3.0–12.0)
Neutro Abs: 3.9 10*3/uL (ref 1.4–7.7)
Neutrophils Relative %: 63.2 % (ref 43.0–77.0)
Platelets: 150 10*3/uL (ref 150.0–400.0)
RBC: 4.18 Mil/uL — ABNORMAL LOW (ref 4.22–5.81)
RDW: 13.4 % (ref 11.5–15.5)
WBC: 6.2 10*3/uL (ref 4.0–10.5)

## 2021-09-07 LAB — BASIC METABOLIC PANEL
BUN: 10 mg/dL (ref 6–23)
CO2: 29 mEq/L (ref 19–32)
Calcium: 9.6 mg/dL (ref 8.4–10.5)
Chloride: 103 mEq/L (ref 96–112)
Creatinine, Ser: 0.96 mg/dL (ref 0.40–1.50)
GFR: 74.82 mL/min (ref >60.00)
Glucose, Bld: 109 mg/dL — ABNORMAL HIGH (ref 70–99)
Potassium: 4.3 mEq/L (ref 3.5–5.1)
Sodium: 137 mEq/L (ref 135–145)

## 2021-09-07 LAB — BRAIN NATRIURETIC PEPTIDE: Pro B Natriuretic peptide (BNP): 211 pg/mL — ABNORMAL HIGH (ref 0.0–100.0)

## 2021-09-07 NOTE — Patient Instructions (Addendum)
IPF (idiopathic pulmonary fibrosis) ([place]) ? ? ?-PFT and symptoms stable OCt 2022 -> May 2023 ?- GGO opacity on PET scan uptake Jan 2023 in [place] likely due to IPF ?- esbriet higher doses causing weight loss ? ? ?Plan ?-continue esbriet 2 pills three times daily with food ?- check CBC, BMET, LFT and BNP ?- do spiro/dlco Sept 2023 ?- do 1 year HRCT Sept 2023 ?- respect no interest in clnical trials ? ? ?Weight loss, non-intentional ? ?- resolved after stoppng ofev and gained since then. ?- losing weight again with esbriet xsince  02/06/2021 ? = 6 # weight loss sice Oct 2022 ? ? ?Plan ?-monitor for weight loss with esbriet ?- agree with lower dose esbriet 2 pills three times daily ? ?COUGH and DYSPHAGIA ? 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 ? ?Plan ? - monitor ?- continue GI support with Dr Benson [place] and Collene Mares ?- can consider gabapentin if needed ? ? ?Follow-up ?- Sept 2023 - do spiro/dlco and HRCT and have 30 min visith with Dr Chase Caller ? ?

## 2021-09-07 NOTE — Progress Notes (Signed)
? ?Jeff Willis, male    DOB: 28-Oct-1940,    MRN: [id number] ? ? 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 dx GERD rx PPI bid before meals x sev months with sinus ct neg and cxr ok and Dg Es mild gerd but no better on prilosec and no worse off so rec gabapentin which he did not try (didn't work for back pain, made him sleepy in higher doses ) so  referred to pulmonary clinic 08/04/2019 by Dr [name]. ? ? ?Prior cough in  2019 for several months  resolved on on some kind of powder inhaler x one week = Advair  ? ? ? ?History of Present Illness  ?08/04/2019  Pulmonary/ 1st office eval/[name]  ?Chief Complaint  ?Patient presents with  ? Pulmonary Consult  ?  Referred by Dr [name]. Pt c/o non prod cough and throat clearing x 6 months.   ?Dyspnea:  None/ no aerobics, walking neighborhoods ok  ?Cough: always dry/ any candy helps if keeps in mouth but esp hard candy / does not use cough drops  ?Sleep: rotates on side, bed is flat/ one pillow, never wakes up cough but /win 15 min of stirring recurs daily  ?SABA use: never, also never prednisone ?Always trouble swallowing pills x lifetime ? ?Call [name] office and ask what inhaler you received in 2019 as sample that seemed to help your cough = advair dpi ?Stop cetrizine, flonase  ?Prednisone 10 mg take  4 each am x 2 days,   2 each am x 2 days,  1 each am x 2 days and stop  ?Pantoprazole (protonix) 40 mg   Take  30-60 min before first meal of the day and Pepcid (famotidine)  20 mg one after supper  until return to office  ?GERD  ? Gabapentin 100 mg four times daily (ok to stop when no coughing at all for a week)  ?Please schedule a follow up office visit in 4 weeks, sooner if needed  with all medications /inhalers/ solutions in hand so we can verify exactly what you are taking. This includes all  medications from all doctors and over the counters ? ? ? ?09/06/2019  f/u ov/[name] re: uacs on gabapenitn 100 mg three times  ?wolicki had tried gabapentin but failed to reveal that at last ov when I recommended it  ?Chief Complaint  ?Patient presents with  ? Follow-up  ?  4 wk f/u. Wants to discuss switching Advair.   ?Dyspnea:  No  ?Cough: better just while on prednisone maybe 50-80%   ?Sleeping: no noct cough ?SABA use: none ?02: none  ?rec ?Gabapentin 200 mg up to 4 x daily  ?Pantoprazole (protonix) 40 mg   Take  30-60 min before first meal of the day and Pepcid (famotidine)  20 mg one @  bedtime until return to office - this is the best way to tell whether stomach acid is contributing to your problem.   ?Keep the candy handy  ?Prednisone 10 mg  Take 4 for three days 3 for three days 2 for three days 1 for three days and stop  ?If not better in 2 weeks see [name] for your reflux  ?Please remember to go to the lab department and xray dept  for your tests - we will call you  with the results when they are available. ? ? ? ? ? ? ? ? ?OV 10/05/2019 ? ?Subjective:  ?[name], male , DOB: 1940-06-15 , age 42 y.o. , MRN: [id number] , ADDRESS: [address] [place] ?[place] [id number] ? ? ?10/05/2019 -   ?Chief Complaint  ?Patient presents with  ? Follow-up  ?  Pt states he has been doing okay since last visit and denies any complaints.  ? ?-81 year old [place] male.  Referred for interstitial lung disease.  He is accompanied by his son Jeff Willis who is a Software engineer.  I know Jeff Willis  from a few years ago when heused to accompany his aunt; asthma patient of mine. Holbert G Mcquitty ? has has been referred by [name] to the ILD center.  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  Up until the pandemic used to travel quite frequently.  He is to go to [place] for 3 months.  At none of the properties he is being exposed to mold.  He developed insidious onset of chronic cough over a year ago  this has been persistent.  In the last few months several cough related treatments have been tried with some improvement.  Has been on gabapentin but this made him sleepy and is currently stopped it.  Because of chronic cough which he only attributes his constant clearing of the throat he had a high-resolution CT scan of the chest that I personally visualized and shows probable UIP pattern and therefore he has been referred here.  In my personal visualization it might even be definite of UIP because there might be some early honeycombing there.  He denies any shortness of breath.  At rest he was found to be tachycardic but he says this can at baseline for him.  He will follow this up with his primary care physician. ? ? Integrated Comprehensive( ILD Questionnaire ? ?Symptoms:  ? ?He denies any shortness of breath.  No episodic shortness of breath.  However on the questioning he does report mild shortness of breath climbing up stairs.  In terms of his cough it started in June 2020.  It is the same since it started maybe in the last week it is slightly better.  Is moderate in intensity.  He does clear the throat.  Mostly dry cough occasionally white sputum.  Does feel a tickle in the back of his throat.  He is not waking up in the middle of the night.  Occasionally affects his voice.  No nausea no vomiting no diarrhea no wheezing. ? ? ?Past Medical History : Denies any asthma COPD or heart failure rheumatoid arthritis or scleroderma or lupus or polymyositis or Sjogren's.  Denies sleep apnea.  Denies HIV denies pulmonary hypertension.  Denies diabetes or thyroid disease or stroke or seizures mononucleosis.  Denies any hepatitis or tuberculosis or kidney disease or pneumonia or blood clots or heart disease or pleurisy.  Does have on and off acid reflux for the last year ? ? ?ROS: Positive for dry mouth for the last several months.  He also has some dysphagia for swallowing pills unclear for what duration.  He  also has acid reflux on PPI and H2 blocker.  Denies any Raynaud's or recurrent fever or weight loss.  No nausea no vomiting. ? ? ?FAMILY HISTORY of LUNG DISEASE: * -Denies family history of COPD or asthma sarcoid or cystic fibrosis of hypersensitive pneumonitis or autoimmune disease. ? ? ?EXPOSURE HISTORY: Denies any Covid.  Denies smoking cigarettes.  Denies marijuana denies smoking.  Denies IV drug use denies cocaine ? ? ?HOME and HOBBY DETAILS : Single-family home [place] for the last 10 years.  Age of the home is 15 years.  No dampness.  No mildew.  No humidifier use.  No CPAP use no nebulizer machine use.  He does use a steam iron but there is no mold or mildew in it.  There is no [place] inside the house no pet birds or parakeets.  No pet gerbils no feather pillows no mold in the [facility] duct no music habits.  He does do some gardening very small garden in the house limited gardening mostly just water the garden.  No bird feather exposure no flood damage no strong mats no hot tub or Jacuzzi.  No exposure to animals at work. ? ? ?OCCUPATIONAL HISTORY (122 questions) :   -Organic antigen exposure is negative inorganic exposure is negative ? ? ?PULMONARY TOXICITY HISTORY (27 items):  -18-day prednisone course that ended last week.  He did have seen implantation for prostate cancer in October 2019 these were radiation seeds. ? ? ? ? ? ?Results for DONYA, TOMARO (MRN [id number]) as of 11/15/2019 16:27 ? Ref. Range 09/07/2019 11:29 10/05/2019 11:25  ?Anti Nuclear Antibody (ANA) Latest Ref Range: NEGATIVE   POSITIVE (A)  ?ANA Pattern 1 Unknown  Nuclear, Speckled (A)  ?ANA Titer 1 Latest Units: titer  1:40 (H)  ?Angiotensin-Converting Enzyme Latest Ref Range: 9 - 67 U/L  34  ?Cyclic Citrullin Peptide Ab Latest Units: UNITS  <16  ?ds DNA Ab Latest Units: IU/mL  <1  ?Myeloperoxidase Abs Latest Units: AI  <1.0  ?Serine Protease 3 Latest Units: AI  <1.0  ?RA Latex Turbid. Latest Ref Range: <14 IU/mL  <14   ?IgE (Immunoglobulin E), Serum Latest Ref Range: <OR=114 kU/L 36   ?SSA (Ro) (ENA) Antibody, IgG Latest Ref Range: <1.0 NEG AI  <1.0 NEG  ?SSB (La) (ENA) Antibody, IgG Latest Ref Range: <1.0 NEG AI  <1.0 NEG

## 2021-09-07 NOTE — Progress Notes (Signed)
Spiro/Dlco done today. 

## 2021-09-13 ENCOUNTER — Telehealth: Payer: Self-pay | Admitting: Internal Medicine

## 2021-09-13 DIAGNOSIS — J84112 Idiopathic pulmonary fibrosis: Secondary | ICD-10-CM

## 2021-09-13 NOTE — Telephone Encounter (Signed)
Patient would like results of labwork.  Please advise

## 2021-09-13 NOTE — Telephone Encounter (Signed)
   Ojk   A)  I wanted a LFT but this was not ordered - so he needs to get this done B) BNP high ->suggests might have pulm htn from his IPF. Plan -> get ECHO  Thanks   LABS    PULMONARY No results for input(s): PHART, PCO2ART, PO2ART, HCO3, TCO2, O2SAT in the last 168 hours.  Invalid input(s): PCO2, PO2  CBC Recent Labs  Lab 09/07/21 1218  HGB 13.7  HCT 40.3  WBC 6.2  PLT 150.0    COAGULATION No results for input(s): INR in the last 168 hours.  CARDIAC  No results for input(s): TROPONINI in the last 168 hours. Recent Labs  Lab 09/07/21 1218  PROBNP 211.0*     CHEMISTRY Recent Labs  Lab 09/07/21 1218  NA 137  K 4.3  CL 103  CO2 29  GLUCOSE 109*  BUN 10  CREATININE 0.96  CALCIUM 9.6   Estimated Creatinine Clearance: 49.5 mL/min (by C-G formula based on SCr of 0.96 mg/dL).   LIVER No results for input(s): AST, ALT, ALKPHOS, BILITOT, PROT, ALBUMIN, INR in the last 168 hours.   INFECTIOUS No results for input(s): LATICACIDVEN, PROCALCITON in the last 168 hours.   ENDOCRINE CBG (last 3)  No results for input(s): GLUCAP in the last 72 hours.       IMAGING x48h  - image(s) personally visualized  -   highlighted in bold No results found.

## 2021-09-17 NOTE — Telephone Encounter (Signed)
Lm for patient.  

## 2021-09-17 NOTE — Telephone Encounter (Signed)
Spoke to patient and relayed below message/recommendations.  He voiced his understanding and had no further questions. LFT and echo ordered. Nothing further needed.

## 2021-09-19 ENCOUNTER — Telehealth: Payer: Self-pay | Admitting: Internal Medicine

## 2021-09-19 NOTE — Telephone Encounter (Signed)
Order for echo was placed 09/17/21.  Called and spoke with pt letting him know the results of bloodwork and recs per MR about having an echo performed.  Pt wanted to know if the echo can either wait until September 2023 when the HRCT will be done or if the HRCT can go ahead and be done now if the echo needs to be done now as he was told by Dr. Claiborne Billings, cardiologist that he should try to have certain tests done at the same time.  MR, please advise on this for pt.

## 2021-09-19 NOTE — Telephone Encounter (Signed)
Echo to be donew "now" I. Next 1-3 weeks . Max 4 weeks

## 2021-09-19 NOTE — Telephone Encounter (Signed)
Jeff Willis   Jeff Willis = has high BNP on labs. Last ECHO was a  year ago and there was possibly early development of pulm hypertension  Plan  - get repeat echo   Thanks    SIGNATURE    Dr. Brand Males, M.D., F.C.C.P,  Pulmonary and Critical Care Medicine Staff Physician, [place] Director - Interstitial Lung Disease  Program  Medical Director - [facility] [place] at [place], [place], [address]  NPI Number:  NPI #[id number] [facility] Number: [id number]  Pager: [phone], If no answer  -[place] or [place] [phone] Telephone (clinical office): [phone] Telephone (research): [phone]  11:06 AM 09/19/2021

## 2021-09-20 NOTE — Telephone Encounter (Signed)
Order is in the [employer].  Jeff Willis has already verified no PA is needed.  I sent message to Edmon Crape - the scheduler for them - and asked her to go ahead and schedule pt within the next 1-3 weeks, 4 weeks max as per MR.  Nothing further needed.

## 2021-09-20 NOTE — Telephone Encounter (Signed)
Called and spoke with pt letting him know that we need to have echo done now within 1-3 weeks and he verbalized understanding.   Routing to PCCs.

## 2021-09-26 ENCOUNTER — Other Ambulatory Visit (INDEPENDENT_AMBULATORY_CARE_PROVIDER_SITE_OTHER): Payer: Medicare Other

## 2021-09-26 DIAGNOSIS — J84112 Idiopathic pulmonary fibrosis: Secondary | ICD-10-CM

## 2021-09-26 LAB — HEPATIC FUNCTION PANEL
ALT: 12 U/L (ref 0–53)
AST: 18 U/L (ref 0–37)
Albumin: 3.9 g/dL (ref 3.5–5.2)
Alkaline Phosphatase: 49 U/L (ref 39–117)
Bilirubin, Direct: 0.1 mg/dL (ref 0.0–0.3)
Total Bilirubin: 0.4 mg/dL (ref 0.2–1.2)
Total Protein: 7.1 g/dL (ref 6.0–8.3)

## 2021-10-10 ENCOUNTER — Ambulatory Visit (HOSPITAL_COMMUNITY): Payer: Medicare Other | Attending: Internal Medicine

## 2021-10-10 DIAGNOSIS — J84112 Idiopathic pulmonary fibrosis: Secondary | ICD-10-CM | POA: Diagnosis not present

## 2021-10-10 DIAGNOSIS — I071 Rheumatic tricuspid insufficiency: Secondary | ICD-10-CM | POA: Diagnosis not present

## 2021-10-10 DIAGNOSIS — R0602 Shortness of breath: Secondary | ICD-10-CM | POA: Diagnosis not present

## 2021-10-10 LAB — ECHOCARDIOGRAM COMPLETE
Area-P 1/2: 2.47 cm2
S' Lateral: 2.6 cm

## 2021-10-15 ENCOUNTER — Telehealth: Payer: Self-pay | Admitting: Internal Medicine

## 2021-10-15 NOTE — Telephone Encounter (Signed)
Spoke to patient. He is requesting Echo results from 10/10/2021.  MR, please advise. Thanks

## 2021-10-15 NOTE — Telephone Encounter (Signed)
Lm for patient.  

## 2021-10-15 NOTE — Telephone Encounter (Signed)
Patient is returning phone call. Patient phone number is [phone].

## 2021-10-16 NOTE — Telephone Encounter (Signed)
  There is now elevation in right heart pressures which is due to his IPF  Plan  - refer to Drs Einar Gip, or Aundra Dubin or [facility] for right heart cath -> if needs to discuss this with me first non-urgent visit (can be vide) with me or app first avail  IMPRESSIONS     1. Left ventricular ejection fraction, by estimation, is 65 to 70%. The  left ventricle has normal function. The left ventricle has no regional  wall motion abnormalities. Left ventricular diastolic parameters are  consistent with Grade I diastolic  dysfunction (impaired relaxation).   2. Right ventricular systolic function is normal. The right ventricular  size is normal. There is mildly elevated pulmonary artery systolic  pressure. The estimated right ventricular systolic pressure is 15.7 mmHg.   3. The mitral valve is grossly normal. Trivial mitral valve  regurgitation.   4. The aortic valve is tricuspid. Aortic valve regurgitation is not  visualized.   5. The inferior vena cava is normal in size with greater than 50%  respiratory variability, suggesting right atrial pressure of 3 mmHg.   Comparison(s): Changes from prior study are noted. 09/07/2020: LVDF 60-65%,  RVSP 34.8 mmHg.

## 2021-10-17 NOTE — Telephone Encounter (Signed)
Called and spoke with pt letting him know the results of the echo and recs per MR and he verbalized understanding. Pt said that he sees cardiologist Dr. Shelva Majestic and wanted to see if the right heart cath eval could be done by him since he is already his cardiologist.  Routing this to Dr. Claiborne Billings. Please advise.

## 2021-10-21 NOTE — Telephone Encounter (Signed)
R heart cath can be done by me or recommend [facility] or [facility]

## 2021-10-25 ENCOUNTER — Telehealth: Payer: Self-pay | Admitting: Internal Medicine

## 2021-10-25 DIAGNOSIS — J849 Interstitial pulmonary disease, unspecified: Secondary | ICD-10-CM

## 2021-10-25 DIAGNOSIS — J84112 Idiopathic pulmonary fibrosis: Secondary | ICD-10-CM

## 2021-10-25 MED ORDER — PIRFENIDONE 267 MG PO TABS: 534.0000 mg | ORAL_TABLET | Freq: Three times a day (TID) | ORAL | 1 refills | Status: DC

## 2021-10-25 NOTE — Telephone Encounter (Signed)
Refill sent for ESBRIET to [facility] ([facility]) for Esbriet: [phone]  Dose: 534 mg three times daily  Last OV: 09/07/21 Provider: Dr. Chase Caller  Next OV: 01/10/22  LFTs on 09/26/21 wnl  Jennye Boroughs Wilhemina Bonito, PharmD, MPH, BCPS Clinical Pharmacist (Rheumatology and Pulmonology)

## 2021-10-29 NOTE — Telephone Encounter (Signed)
Patient scheduled with Dr.Kelly on 07/19. Thanks!

## 2021-11-14 ENCOUNTER — Ambulatory Visit: Payer: Medicare Other | Admitting: Cardiovascular Disease

## 2021-11-14 ENCOUNTER — Encounter: Payer: Self-pay | Admitting: Cardiovascular Disease

## 2021-11-14 DIAGNOSIS — R000 Tachycardia, unspecified: Secondary | ICD-10-CM

## 2021-11-14 DIAGNOSIS — K219 Gastro-esophageal reflux disease without esophagitis: Secondary | ICD-10-CM

## 2021-11-14 DIAGNOSIS — I7121 Aneurysm of the ascending aorta, without rupture: Secondary | ICD-10-CM

## 2021-11-14 DIAGNOSIS — J84112 Idiopathic pulmonary fibrosis: Secondary | ICD-10-CM | POA: Diagnosis not present

## 2021-11-14 DIAGNOSIS — E785 Hyperlipidemia, unspecified: Secondary | ICD-10-CM | POA: Diagnosis not present

## 2021-11-14 DIAGNOSIS — I2510 Atherosclerotic heart disease of native coronary artery without angina pectoris: Secondary | ICD-10-CM

## 2021-11-14 NOTE — Progress Notes (Signed)
Cardiology Office Note    Date:  11/17/2021   ID:  Jeff Willis, Jeff Willis 07/03/1940, MRN [id number]  PCP:  Gaynelle Arabian, MD  Cardiologist:  Shelva Majestic, MD   4 month F/U cardiology evaluation initially referred through the courtesy of Dr. Gaynelle Arabian  History of Present Illness:  Jeff Willis is a 81 y.o. male who is originally from [place] but has been living in [place] for 51 years.  He has been followed by Dr. Chase Caller and is felt to have probable interstitial lung disease.  A high-resolution CT scan of his chest showed probable UIP pattern with possible early honeycombing.  He also was noted to have an ectatic 4.5 cm ascending thoracic aorta and aortic atherosclerosis involving the left main and two-vessel CAD. He recently saw Dr. [name] and has had issues with elevated heart rate that seems to have been fairly consistent over several office visits.  He was referred for an echo Doppler study on Sep 07, 2020 by Dr. Chase Caller EF of 60 to 65% without wall motion abnormalities.  Right ventricular size was normal.  There was mildly elevated pulmonary artery pressure.  Estimated RV systolic pressure was 78.2.  There was mild to moderate tricuspid regurgitation.  His aortic valve was trileaflet and there was no stenosis.  When I saw him for initial evaluation on November 29, 2020 he admitted to a longstanding history of increased heart rate which often was contributed by anxiety.  He was typically drinking tea approximately 2 times per day.  He typically walks 30 minutes 4 days/week and denies any exertional anginal symptomatology.  He specifically denies any chest tightness or exertional shortness of breath.  He denies any presyncope or syncope.  He is unaware of any history of atrial fibrillation.  He had been treated with Ofev for his probable interstitial lung disease but due to significant reduction in appetite this was discontinued approximately 4-1/2 months ago.  At his  initial evaluation, I reviewed the investigations by Dr. Chase Caller as well as Dr. Mikle Bosworth.  Prior lipid studies had shown an LDL of 133.  I recommended he undergo follow-up laboratory with a comprehensive metabolic panel, magnesium level, free T3 and free T4 CBC and lipid studies.  I initiated metoprolol succinate with low-dose with plans to titrate as necessary and recommended he undergo coronary CTA for further evaluation of his CT detected coronary atherosclerosis.  Coronary CTA was done on December 08, 2020 which revealed a calcium score of 18 placing him in only the 9th percentile for age and sex matched control.  He had right dominant coronary arteries with minimal atherosclerotic plaque in the mid RCA less than 25%, distal left main less than 25%, 25 to 49% in the LAD, and less than 25% in the circumflex vessel.  Chest over read showed interstitial lung disease with possible progressive groundglass in the lower lobes.  There also was a 1 cm central left upper lobe pulmonary nodule.  Ascending aortic aneurysm was 4.2 cm.  Chemistry and thyroid laboratory were essentially normal,however, total cholesterol was 208 and LDL 129 with HDL of 60..  Presently, he feels well.  His resting pulse has improved with metoprolol succinate 25 mg daily.  He is on rosuvastatin 10 mg, and Pepcid 40 mg.  He continues to be on Esbriet for his interstitial lung disease and baby aspirin.  Since I  saw him on February 05, 2021 he and his wife went to [place] for 4 months.  They returned  last week.  I saw him in follow-up on July 12, 2021 at which time he continued to feel well and denied any chest pain or shortness of breath.  He denied palpitations, presyncope or syncope.  He continues to be active.  He is on metoprolol succinate 25 mg daily in addition to rosuvastatin 10 mg and aspirin.  He takes Protonix for GERD.  During that evaluation I recommended he continue taking metoprolol succinate 25 mg daily particularly with his mild  dilatation of ascending aorta at 43 mm.  He denied any significant wheezing or shortness of breath.  His GERD was controlled with Protonix and he was tolerating rosuvastatin 10 mg.  He subsequently was reevaluated by Dr. Chase Caller in May 2023 for his interstitial lung disease/idiopathic pulmonary fibrosis.  His last CT in September 2022 suggested progression on CT scan and his prior treatment with nintedanib 100 mg twice a day and it was discontinued and he was changed to pirfenidone.  Since I saw him, he was referred for a follow-up echo Doppler study on October 10, 2021.  This showed hyperdynamic LV function with EF 65 to 70% with grade 1 diastolic dysfunction.  On this echo his RV systolic pressure had risen and was now felt to be 39.2 mmHg.  As result of this increase in pulmonary pressure, it was recommended by Dr. Chase Caller that he undergo a right heart cardiac catheterization.  Mr. Tejera presents to the office today to discuss this need for right heart catheterization with ultimate scheduling.  He denies chest pain.  He is unaware of audible wheezing.  He denies palpitations.  He continues to be on pirfenidone 267 mg 2 tablets in the morning, at noon, and at bedtime, metoprolol succinate 25 mg daily, aspirin 81 mg, and rosuvastatin 10 mg.  He presents for preoperative evaluation.   Past Medical History:  Diagnosis Date   Back pain    Elevated PSA    Essential tremor    Hypercholesteremia    Neuropathy    Prostate cancer ([place])    Sinus tachycardia 01/30/2018    Past Surgical History:  Procedure Laterality Date   COLONOSCOPY WITH PROPOFOL N/A 08/10/2021   Procedure: COLONOSCOPY WITH PROPOFOL;  Surgeon: Carol Ada, MD;  Location: [facility];  Service: Gastroenterology;  Laterality: N/A;   ESOPHAGEAL DILATION  08/10/2021   Procedure: ESOPHAGEAL DILATION;  Surgeon: Carol Ada, MD;  Location: [facility];  Service: Gastroenterology;;   ESOPHAGOGASTRODUODENOSCOPY (EGD) WITH PROPOFOL  N/A 08/10/2021   Procedure: ESOPHAGOGASTRODUODENOSCOPY (EGD) WITH PROPOFOL;  Surgeon: Carol Ada, MD;  Location: [facility];  Service: Gastroenterology;  Laterality: N/A;   HEMOSTASIS CLIP PLACEMENT  08/10/2021   Procedure: HEMOSTASIS CLIP PLACEMENT;  Surgeon: Carol Ada, MD;  Location: [facility];  Service: Gastroenterology;;   LUMBAR MICRODISCECTOMY     L4-5   POLYPECTOMY  08/10/2021   Procedure: POLYPECTOMY;  Surgeon: Carol Ada, MD;  Location: [facility];  Service: Gastroenterology;;   RADIOACTIVE SEED IMPLANT N/A 02/24/2018   Procedure: RADIOACTIVE SEED IMPLANT/BRACHYTHERAPY IMPLANT;  Surgeon: Festus Aloe, MD;  Location: [facility];  Service: Urology;  Laterality: N/A;   SPACE OAR INSTILLATION N/A 02/24/2018   Procedure: SPACE OAR INSTILLATION;  Surgeon: Festus Aloe, MD;  Location: [facility];  Service: Urology;  Laterality: N/A;    Current Medications: Outpatient Medications Prior to Visit  Medication Sig Dispense Refill   aspirin EC 81 MG tablet Take 81 mg by mouth daily. Swallow whole.     Cyanocobalamin (VITAMIN B-12  PO) Take 1 tablet by mouth 3 (three) times a week.     metoprolol succinate (TOPROL XL) 25 MG 24 hr tablet Take 1 tablet (25 mg total) by mouth daily. 90 tablet 3   pantoprazole (PROTONIX) 40 MG tablet Take 40 mg by mouth daily as needed (heartburn).     Pirfenidone (ESBRIET) 267 MG TABS Take 2 tablets (534 mg total) by mouth in the morning, at noon, and at bedtime. **LOW DOSE AS MAINTENANCE** (Patient taking differently: Take 267 mg by mouth in the morning, at noon, and at bedtime. **LOW DOSE AS MAINTENANCE**) 540 tablet 1   rosuvastatin (CRESTOR) 10 MG tablet Take 10 mg by mouth at bedtime.     UNABLE TO FIND Take 1 Dose by mouth 2 (two) times daily. Med Name: Prime Remedy Care: Ayurvita Multiherb Paste     UNABLE TO FIND Take 1 Dose by mouth daily. Med Name: Customize Multiherb Multivitamin Solution     No  facility-administered medications prior to visit.     Allergies:   Ofev [nintedanib]   Social History   Socioeconomic History   Marital status: Married    Spouse name: Not on file   Number of children: 3   Years of education: Masters   Highest education level: Not on file  Occupational History   Occupation: Field seismologist    Comment: working part time  Tobacco Use   Smoking status: Never   Smokeless tobacco: Never  Vaping Use   Vaping Use: Never used  Substance and Sexual Activity   Alcohol use: No   Drug use: No   Sexual activity: Yes  Other Topics Concern   Not on file  Social History Narrative   Lives at home with his family.   Right-handed.   No caffeine use.   One daughter and two sons.   Social Determinants of Health   Financial Resource Strain: Not on file  Food Insecurity: Not on file  Transportation Needs: Not on file  Physical Activity: Not on file  Stress: Not on file  Social Connections: Not on file     He is originally from [place], [place].  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  Prior to [place] he traveled extensively.  He is married for 59 years.  He has 3 children.  There is no Bacot use or alcohol use.  He is semiretired.  Family History:  The patient's family history includes Other in his father and mother.   His mother died at age 35.  Father died at age 53 and had pneumonia and fever.  He has a brother living at 12.  He has 1 living sister who is 41.  2 sisters are deceased 1 at age 81 after a fall fracture and another at age 78.  ROS General: Negative; No fevers, chills, or night sweats;  HEENT: Negative; No changes in vision or hearing, sinus congestion, difficulty swallowing Pulmonary: Idiopathic pulmonary fibrosis Cardiovascular: See HPI GI: Negative; No nausea, vomiting, diarrhea, or abdominal pain GU: Negative; No dysuria, hematuria, or difficulty voiding Musculoskeletal: Negative; no myalgias, joint pain, or  weakness Hematologic/Oncology: Negative; no easy bruising, bleeding Endocrine: Negative; no heat/cold intolerance; no diabetes Neuro: Negative; no changes in balance, headaches Skin: Negative; No rashes or skin lesions Psychiatric: Negative; No behavioral problems, depression Sleep: Negative; No snoring, daytime sleepiness, hypersomnolence, bruxism, restless legs, hypnogognic hallucinations, no cataplexy Other comprehensive 14 point system review is negative.   PHYSICAL EXAM:   VS:  BP 130/74   Pulse 70   Ht $R'5\' 7"'ox$  (1.702 m)   Wt 124 lb 3.2 oz (56.3 kg)   SpO2 99%   BMI 19.45 kg/m     Repeat blood pressure by me was 126/66  Wt Readings from Last 3 Encounters:  11/14/21 124 lb 3.2 oz (56.3 kg)  09/07/21 125 lb 9.6 oz (57 kg)  08/10/21 125 lb 10.6 oz (57 kg)   General: Alert, oriented, no distress.  Skin: normal turgor, no rashes, warm and dry HEENT: Normocephalic, atraumatic. Pupils equal round and reactive to light; sclera anicteric; extraocular muscles intact;  Nose without nasal septal hypertrophy Mouth/Parynx benign; Mallinpatti scale 3 Neck: No JVD, no carotid bruits; normal carotid upstroke Lungs: clear to ausculatation and percussion; no wheezing or rales Chest wall: without tenderness to palpitation Heart: PMI not displaced, RRR, s1 s2 normal, 1/6 systolic murmur, no diastolic murmur, no rubs, gallops, thrills, or heaves Abdomen: soft, nontender; no hepatosplenomehaly, BS+; abdominal aorta nontender and not dilated by palpation. Back: no CVA tenderness Pulses 2+ Musculoskeletal: full range of motion, normal strength, no joint deformities Extremities: no clubbing cyanosis or edema, Homan's sign negative  Neurologic: grossly nonfocal; Cranial nerves grossly wnl Psychologic: Normal mood and affect    Studies/Labs Reviewed:   November 14, 2021 ECG (independently read by me): NSR at 70, no ectopy, normal intervals  July 12, 2021 ECG (independently read by me): NSR at  71, possible LA enlargement  February 05, 2021 ECG (independently read by me): NSR at 82, Possible LA enlargement  November 29, 2020 ECG (independently read by me): Sinus tachycardia at 108; possible LAE, no ectopy  Recent Labs:    Latest Ref Rng & Units 11/14/2021    4:10 PM 09/07/2021   12:18 PM 11/30/2020    8:43 AM  BMP  Glucose 70 - 99 mg/dL 128  109  87   BUN 8 - 27 mg/dL $Remove'9  10  10   'hOCjZqQ$ Creatinine 0.76 - 1.27 mg/dL 0.90  0.96  1.00   BUN/Creat Ratio 10 - $Re'24 10   10   'nPu$ Sodium 134 - 144 mmol/L 140  137  142   Potassium 3.5 - 5.2 mmol/L 3.7  4.3  4.6   Chloride 96 - 106 mmol/L 101  103  103   CO2 20 - 29 mmol/L $RemoveB'26  29  25   'KRohpJVv$ Calcium 8.6 - 10.2 mg/dL 9.9  9.6  9.9         Latest Ref Rng & Units 09/26/2021    2:51 PM 07/19/2021    3:01 PM 02/26/2021    2:12 PM  Hepatic Function  Total Protein 6.0 - 8.3 g/dL 7.1  6.7  7.2   Albumin 3.5 - 5.2 g/dL 3.9  3.8  4.0   AST 0 - 37 U/L $Remo'18  18  18   'zTisf$ ALT 0 - 53 U/L $Remo'12  13  14   'NZNeK$ Alk Phosphatase 39 - 117 U/L 49  47  54   Total Bilirubin 0.2 - 1.2 mg/dL 0.4  0.4  0.4   Bilirubin, Direct 0.0 - 0.3 mg/dL 0.1  0.1  0.1        Latest Ref Rng & Units 11/14/2021    4:10 PM 09/07/2021   12:18 PM 11/30/2020    8:43 AM  CBC  WBC 3.4 - 10.8 x10E3/uL 7.8  6.2  7.4   Hemoglobin 13.0 - 17.7 g/dL 13.7  13.7  14.7   Hematocrit 37.5 - 51.0 %  39.3  40.3  43.1   Platelets 150 - 450 x10E3/uL 171  150.0  188    Lab Results  Component Value Date   MCV 93 11/14/2021   MCV 96.4 09/07/2021   MCV 96 11/30/2020   No results found for: "TSH" No results found for: "HGBA1C"   BNP No results found for: "BNP"  ProBNP    Component Value Date/Time   PROBNP 211.0 (H) 09/07/2021 1218     Lipid Panel     Component Value Date/Time   CHOL 203 (H) 11/30/2020 0843   TRIG 75 11/30/2020 0843   HDL 60 11/30/2020 0843   CHOLHDL 3.4 11/30/2020 0843   LDLCALC 129 (H) 11/30/2020 0843   LABVLDL 14 11/30/2020 0843     RADIOLOGY: No results found.   Additional  studies/ records that were reviewed today include:   ECHO: 09/07/2020 IMPRESSIONS   1. Left ventricular ejection fraction, by estimation, is 60 to 65%. The  left ventricle has normal function. The left ventricle has no regional  wall motion abnormalities. Left ventricular diastolic parameters are  indeterminate.   2. Right ventricular systolic function is normal. The right ventricular  size is normal. There is mildly elevated pulmonary artery systolic  pressure. The estimated right ventricular systolic pressure is 70.6 mmHg.   3. The mitral valve is normal in structure. No evidence of mitral valve  regurgitation.   4. Tricuspid valve regurgitation is mild to moderate.   5. The aortic valve is tricuspid. Aortic valve regurgitation is not  visualized. No aortic stenosis is present.   6. The inferior vena cava is normal in size with greater than 50%  respiratory variability, suggesting right atrial pressure of 3 mmHg.    CT chest high-resolution: January 13, 2020 IMPRESSION: 1. The appearance of the lungs is compatible with progressive interstitial lung disease categorized as probable usual interstitial pneumonia (UIP) per current ATS guidelines. 2. Aortic atherosclerosis, in addition to left main and 2 vessel coronary artery disease. In addition, there is mild aneurysmal dilatation of the ascending thoracic aorta (4.5 cm in diameter). Ascending thoracic aortic aneurysm. Recommend semi-annual imaging followup by CTA or MRA and referral to cardiothoracic surgery if not already obtained. This recommendation follows 2010 ACCF/AHA/AATS/ACR/ASA/SCA/SCAI/SIR/STS/SVM Guidelines for the Diagnosis and Management of Patients With Thoracic Aortic Disease. Circulation. 2010; 121: C376-E831. Aortic aneurysm NOS (ICD10-I71.9).   Aortic Atherosclerosis (ICD10-I70.0). Aortic aneurysm NOS (ICD10-I71.9).   Coronary CTA: 12/08/2020 FINDINGS: Image quality: Adequate.   Noise artifact is: Mild  misregistration.   Coronary calcium score is 18, which places the patient in the 9th percentile for age and sex matched control. Comparison made to white males.   Coronary arteries: Normal coronary origins.  Right dominance.   Right Coronary Artery: Minimal atherosclerotic plaque in the mid RCA, <25% stenosis.   Left Main Coronary Artery: Minimal atherosclerotic plaque in the distal LMCA, <25% stenosis.   Left Anterior Descending Coronary Artery: Mild atherosclerotic plaque at the ostial LAD, 25-49% stenosis. Mild tubular atherosclerotic plaque in the proximal LAD, 25-49% stenosis, but appears to be approaching moderate stenosis.   Left Circumflex Artery: Minimal atherosclerotic plaque in the proximal L circumflex artery, <25% stenosis.   Aorta:   Measurements made in double oblique technique, sinus to sinus:   Sinus of Valsalva:   R-L: 38 mm   L-Non: 37 mm   R-Non: 35 mm   Mid ascending aorta (at PA bifurcation): 43 mm, mild dilation of ascending aorta.   Mid descending aorta (at level  of pulmonary veins): 21 mm   Aortic Valve: Tricuspid aortic valve, no calcifications.   Other findings:   Normal pulmonary vein drainage into the left atrium.   Normal left atrial appendage without a thrombus.   Normal size of the pulmonary artery.   IMPRESSION: 1. Mild CAD in ostial and proximal LAD, CADRADS = 2.   2. Coronary calcium score is 18, which places the patient in the 9th percentile for age and sex matched control.   3. Normal coronary origin with right dominance.   4. Mild dilation of ascending thoracic aorta, 43 mm at mid ascending aorta.   5.  Tricuspid aortic valve    ECHO: 10/10/2021  1. Left ventricular ejection fraction, by estimation, is 65 to 70%. The  left ventricle has normal function. The left ventricle has no regional  wall motion abnormalities. Left ventricular diastolic parameters are  consistent with Grade I diastolic  dysfunction  (impaired relaxation).   2. Right ventricular systolic function is normal. The right ventricular  size is normal. There is mildly elevated pulmonary artery systolic  pressure. The estimated right ventricular systolic pressure is 80.1 mmHg.   3. The mitral valve is grossly normal. Trivial mitral valve  regurgitation.   4. The aortic valve is tricuspid. Aortic valve regurgitation is not  visualized.   5. The inferior vena cava is normal in size with greater than 50%  respiratory variability, suggesting right atrial pressure of 3 mmHg.   Comparison(s): Changes from prior study are noted. 09/07/2020: LVDF 60-65%,  RVSP 34.8 mmHg.   ASSESSMENT:    1. Mild CAD   2. Idiopathic pulmonary fibrosis ([place])   3. Hyperlipidemia with target LDL less than 70   4. Aneurysm of ascending aorta without rupture ([place])   5. Gastroesophageal reflux disease without esophagitis     PLAN:  Mr.Braylon Didonato  is a young appearing 81 year-old gentleman who is originally from [place] and has lived in the [place] for the last 51 years.  He has had issues with chronic cough as well as weight loss.  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 Chase Caller.  Remotely, he was on nintedanib but due to some progression he is now on pirfenidone.  He has been documented to have mild dilation of his ascending aorta in addition to atherosclerosis involving his left main into coronary vessels.  Coronary CTA showed only mild coronary calcification with a score of 18 representing only 9% for age and sex matched control.  There was mild coronary plaque at the ostium and proximal LAD with minimal plaque elsewhere.  He has been treated with lipid-lowering therapy and laboratory in September 2022 showed an LDL at 32 he had recently been reevaluated by Dr. Chase Caller.  Due to concerns on his 2D echo Doppler study that RV systolic pressure had risen to 39.2 it was recommended that he undergo  right heart cardiac catheterization.  I discussed the right heart catheterization with him in detail today.  I discussed the risk benefits of the procedure.  He will undergo preoperative laboratory and we will plan to perform this procedure on November 22, 2021.  Presently, his blood pressure and heart rate are stable on his current regimen.  He continues to be on pantoprazole for GERD, metoprolol succinate 25 mg daily in addition to rosuvastatin 10 mg.   Medication Adjustments/Labs and Tests Ordered: Current medicines are reviewed at length with the patient today.  Concerns regarding  medicines are outlined above.  Medication changes, Labs and Tests ordered today are listed in the Patient Instructions below. Patient Instructions  Medication Instructions:  No changes *If you need a refill on your cardiac medications before your next appointment, please call your pharmacy*  Testing/Procedures: Your physician has requested that you have a cardiac catheterization. Cardiac catheterization is used to diagnose and/or treat various heart conditions. Doctors may recommend this procedure for a number of different reasons. The most common reason is to evaluate chest pain. Chest pain can be a symptom of coronary artery disease (CAD), and cardiac catheterization can show whether plaque is narrowing or blocking your heart's arteries. This procedure is also used to evaluate the valves, as well as measure the blood flow and oxygen levels in different parts of your heart. For further information please visit [email or web address]. Please follow instruction sheet, as given.   Follow-Up: At [facility], you and your health needs are our priority.  As part of our continuing mission to provide you with exceptional heart care, we have created designated Provider Care Teams.  These Care Teams include your primary Cardiologist (physician) and Advanced Practice Providers (APPs -  Physician Assistants and Nurse Practitioners) who  all work together to provide you with the care you need, when you need it.  We recommend signing up for the patient portal called "MyChart".  Sign up information is provided on this After Visit Summary.  MyChart is used to connect with patients for Virtual Visits (Telemedicine).  Patients are able to view lab/test results, encounter notes, upcoming appointments, etc.  Non-urgent messages can be sent to your provider as well.   To learn more about what you can do with MyChart, go to [email or web address].    Your next appointment:   1 month(s)  The format for your next appointment:   In Person  Provider:   Dr. Claiborne Billings or APP  Other [place] [address] Dept: [phone] Loc: [place]  11/14/2021  You are scheduled for a Cardiac Catheterization on Thursday, July 27 with Dr. Shelva Majestic.  1. Please arrive at the Main Entrance A at [facility]: [place], [place] [address] at 5:30 AM (This time is two hours before your procedure to ensure your preparation). Free valet parking service is available.   Special note: Every effort is made to have your procedure done on time. Please understand that emergencies sometimes delay scheduled procedures.  2. Diet: Do not eat solid foods after midnight.  You may have clear liquids until 5 AM upon the day of the procedure.  3. Labs: You will need to have blood drawn on 7/18.You do not need to be fasting.  4. Medication instructions in preparation for your procedure: Nothing to hold  On the morning of your procedure, take Aspirin and any morning medicines NOT listed above.  You may use sips of water.  5. Plan to go home the same day, you will only stay overnight if medically necessary. 6. You MUST have a responsible adult to drive you home. 7. An adult MUST be with you  the first 24 hours after you arrive home. 8. Bring a current list of your medications, and the last time and date medication taken. 9. Bring ID and current insurance cards. 10.Please wear clothes that are easy to get on and off and wear slip-on shoes.  Thank you for  allowing [place] to care for you!   -- [facility] Invasive Cardiovascular services      Signed, Shelva Majestic, MD  11/17/2021 5:03 PM    [facility] [address] [place], [place],   [address] Phone: [phone]

## 2021-11-14 NOTE — Patient Instructions (Signed)
Medication Instructions:  No changes *If you need a refill on your cardiac medications before your next appointment, please call your pharmacy*  Testing/Procedures: Your physician has requested that you have a cardiac catheterization. Cardiac catheterization is used to diagnose and/or treat various heart conditions. Doctors may recommend this procedure for a number of different reasons. The most common reason is to evaluate chest pain. Chest pain can be a symptom of coronary artery disease (CAD), and cardiac catheterization can show whether plaque is narrowing or blocking your heart's arteries. This procedure is also used to evaluate the valves, as well as measure the blood flow and oxygen levels in different parts of your heart. For further information please visit [email or web address]. Please follow instruction sheet, as given.   Follow-Up: At [facility], you and your health needs are our priority.  As part of our continuing mission to provide you with exceptional heart care, we have created designated Provider Care Teams.  These Care Teams include your primary Cardiologist (physician) and Advanced Practice Providers (APPs -  Physician Assistants and Nurse Practitioners) who all work together to provide you with the care you need, when you need it.  We recommend signing up for the patient portal called "MyChart".  Sign up information is provided on this After Visit Summary.  MyChart is used to connect with patients for Virtual Visits (Telemedicine).  Patients are able to view lab/test results, encounter notes, upcoming appointments, etc.  Non-urgent messages can be sent to your provider as well.   To learn more about what you can do with MyChart, go to [email or web address].    Your next appointment:   1 month(s)  The format for your next appointment:   In Person  Provider:   Dr. Claiborne Billings or APP  Other [address] [place] [address] Dept: [phone] Loc: [place]  11/14/2021  You are scheduled for a Cardiac Catheterization on Thursday, July 27 with Dr. Shelva Majestic.  1. Please arrive at the Main Entrance A at [facility]: [place] [id number] at 5:30 AM (This time is two hours before your procedure to ensure your preparation). Free valet parking service is available.   Special note: Every effort is made to have your procedure done on time. Please understand that emergencies sometimes delay scheduled procedures.  2. Diet: Do not eat solid foods after midnight.  You may have clear liquids until 5 AM upon the day of the procedure.  3. Labs: You will need to have blood drawn on 7/18.You do not need to be fasting.  4. Medication instructions in preparation for your procedure: Nothing to hold  On the morning of your procedure, take Aspirin and any morning medicines NOT listed above.  You may use sips of water.  5. Plan to go home the same day, you will only stay overnight if medically necessary. 6. You MUST have a responsible adult to drive you home. 7. An adult MUST be with you the first 24 hours after you arrive home. 8. Bring a current list of your medications, and the last time and date medication taken. 9. Bring ID and current insurance cards. 10.Please wear clothes that are easy to get on and off and wear slip-on shoes.  Thank you for allowing [place] to care for you!   -- [facility]

## 2021-11-14 NOTE — H&P (View-Only) (Signed)
Cardiology Office Note    Date:  11/17/2021   ID:  Jeff, Willis 22-Oct-1940, MRN [id number]  PCP:  Blair Heys, MD  Cardiologist:  Nicki Guadalajara, MD   4 month F/U cardiology evaluation initially referred through the courtesy of Dr. Blair Heys  History of Present Illness:  Jeff Willis is a 81 y.o. male who is originally from [place] but has been living in [place] for 51 years.  He has been followed by Dr. Marchelle Gearing and is felt to have probable interstitial lung disease.  A high-resolution CT scan of his chest showed probable UIP pattern with possible early honeycombing.  He also was noted to have an ectatic 4.5 cm ascending thoracic aorta and aortic atherosclerosis involving the left main and two-vessel CAD. He recently saw Dr. Manus Gunning and has had issues with elevated heart rate that seems to have been fairly consistent over several office visits.  He was referred for an echo Doppler study on Sep 07, 2020 by Dr. Marchelle Gearing EF of 60 to 65% without wall motion abnormalities.  Right ventricular size was normal.  There was mildly elevated pulmonary artery pressure.  Estimated RV systolic pressure was 34.8.  There was mild to moderate tricuspid regurgitation.  His aortic valve was trileaflet and there was no stenosis.  When I saw him for initial evaluation on November 29, 2020 he admitted to a longstanding history of increased heart rate which often was contributed by anxiety.  He was typically drinking tea approximately 2 times per day.  He typically walks 30 minutes 4 days/week and denies any exertional anginal symptomatology.  He specifically denies any chest tightness or exertional shortness of breath.  He denies any presyncope or syncope.  He is unaware of any history of atrial fibrillation.  He had been treated with Ofev for his probable interstitial lung disease but due to significant reduction in appetite this was discontinued approximately 4-1/2 months ago.  At his  initial evaluation, I reviewed the investigations by Dr. Marchelle Gearing as well as Dr. Bennie Hind.  Prior lipid studies had shown an LDL of 133.  I recommended he undergo follow-up laboratory with a comprehensive metabolic panel, magnesium level, free T3 and free T4 CBC and lipid studies.  I initiated metoprolol succinate with low-dose with plans to titrate as necessary and recommended he undergo coronary CTA for further evaluation of his CT detected coronary atherosclerosis.  Coronary CTA was done on December 08, 2020 which revealed a calcium score of 18 placing him in only the 9th percentile for age and sex matched control.  He had right dominant coronary arteries with minimal atherosclerotic plaque in the mid RCA less than 25%, distal left main less than 25%, 25 to 49% in the LAD, and less than 25% in the circumflex vessel.  Chest over read showed interstitial lung disease with possible progressive groundglass in the lower lobes.  There also was a 1 cm central left upper lobe pulmonary nodule.  Ascending aortic aneurysm was 4.2 cm.  Chemistry and thyroid laboratory were essentially normal,however, total cholesterol was 208 and LDL 129 with HDL of 60..  Presently, he feels well.  His resting pulse has improved with metoprolol succinate 25 mg daily.  He is on rosuvastatin 10 mg, and Pepcid 40 mg.  He continues to be on Esbriet for his interstitial lung disease and baby aspirin.  Since I  saw him on February 05, 2021 he and his wife went to [place] for 4 months.  They returned  last week.  I saw him in follow-up on July 12, 2021 at which time he continued to feel well and denied any chest pain or shortness of breath.  He denied palpitations, presyncope or syncope.  He continues to be active.  He is on metoprolol succinate 25 mg daily in addition to rosuvastatin 10 mg and aspirin.  He takes Protonix for GERD.  During that evaluation I recommended he continue taking metoprolol succinate 25 mg daily particularly with his mild  dilatation of ascending aorta at 43 mm.  He denied any significant wheezing or shortness of breath.  His GERD was controlled with Protonix and he was tolerating rosuvastatin 10 mg.  He subsequently was reevaluated by Dr. Chase Caller in May 2023 for his interstitial lung disease/idiopathic pulmonary fibrosis.  His last CT in September 2022 suggested progression on CT scan and his prior treatment with nintedanib 100 mg twice a day and it was discontinued and he was changed to pirfenidone.  Since I saw him, he was referred for a follow-up echo Doppler study on October 10, 2021.  This showed hyperdynamic LV function with EF 65 to 70% with grade 1 diastolic dysfunction.  On this echo his RV systolic pressure had risen and was now felt to be 39.2 mmHg.  As result of this increase in pulmonary pressure, it was recommended by Dr. Chase Caller that he undergo a right heart cardiac catheterization.  [name] presents to the office today to discuss this need for right heart catheterization with ultimate scheduling.  He denies chest pain.  He is unaware of audible wheezing.  He denies palpitations.  He continues to be on pirfenidone 267 mg 2 tablets in the morning, at noon, and at bedtime, metoprolol succinate 25 mg daily, aspirin 81 mg, and rosuvastatin 10 mg.  He presents for preoperative evaluation.   Past Medical History:  Diagnosis Date   Back pain    Elevated PSA    Essential tremor    Hypercholesteremia    Neuropathy    Prostate cancer ([place])    Sinus tachycardia 01/30/2018    Past Surgical History:  Procedure Laterality Date   COLONOSCOPY WITH PROPOFOL N/A 08/10/2021   Procedure: COLONOSCOPY WITH PROPOFOL;  Surgeon: Carol Ada, MD;  Location: [facility];  Service: Gastroenterology;  Laterality: N/A;   ESOPHAGEAL DILATION  08/10/2021   Procedure: ESOPHAGEAL DILATION;  Surgeon: Carol Ada, MD;  Location: [facility];  Service: Gastroenterology;;   ESOPHAGOGASTRODUODENOSCOPY (EGD) WITH PROPOFOL  N/A 08/10/2021   Procedure: ESOPHAGOGASTRODUODENOSCOPY (EGD) WITH PROPOFOL;  Surgeon: Carol Ada, MD;  Location: [facility];  Service: Gastroenterology;  Laterality: N/A;   HEMOSTASIS CLIP PLACEMENT  08/10/2021   Procedure: HEMOSTASIS CLIP PLACEMENT;  Surgeon: Carol Ada, MD;  Location: [facility];  Service: Gastroenterology;;   LUMBAR MICRODISCECTOMY     L4-5   POLYPECTOMY  08/10/2021   Procedure: POLYPECTOMY;  Surgeon: Carol Ada, MD;  Location: [facility];  Service: Gastroenterology;;   RADIOACTIVE SEED IMPLANT N/A 02/24/2018   Procedure: RADIOACTIVE SEED IMPLANT/BRACHYTHERAPY IMPLANT;  Surgeon: Festus Aloe, MD;  Location: [facility];  Service: Urology;  Laterality: N/A;   SPACE OAR INSTILLATION N/A 02/24/2018   Procedure: SPACE OAR INSTILLATION;  Surgeon: Festus Aloe, MD;  Location: [facility];  Service: Urology;  Laterality: N/A;    Current Medications: Outpatient Medications Prior to Visit  Medication Sig Dispense Refill   aspirin EC 81 MG tablet Take 81 mg by mouth daily. Swallow whole.     Cyanocobalamin (VITAMIN B-12  PO) Take 1 tablet by mouth 3 (three) times a week.     metoprolol succinate (TOPROL XL) 25 MG 24 hr tablet Take 1 tablet (25 mg total) by mouth daily. 90 tablet 3   pantoprazole (PROTONIX) 40 MG tablet Take 40 mg by mouth daily as needed (heartburn).     Pirfenidone (ESBRIET) 267 MG TABS Take 2 tablets (534 mg total) by mouth in the morning, at noon, and at bedtime. **LOW DOSE AS MAINTENANCE** (Patient taking differently: Take 267 mg by mouth in the morning, at noon, and at bedtime. **LOW DOSE AS MAINTENANCE**) 540 tablet 1   rosuvastatin (CRESTOR) 10 MG tablet Take 10 mg by mouth at bedtime.     UNABLE TO FIND Take 1 Dose by mouth 2 (two) times daily. Med Name: Prime Remedy Care: Ayurvita Multiherb Paste     UNABLE TO FIND Take 1 Dose by mouth daily. Med Name: Customize Multiherb Multivitamin Solution     No  facility-administered medications prior to visit.     Allergies:   Ofev [nintedanib]   Social History   Socioeconomic History   Marital status: Married    Spouse name: Not on file   Number of children: 3   Years of education: Masters   Highest education level: Not on file  Occupational History   Occupation: Armed forces technical officer    Comment: working part time  Tobacco Use   Smoking status: Never   Smokeless tobacco: Never  Vaping Use   Vaping Use: Never used  Substance and Sexual Activity   Alcohol use: No   Drug use: No   Sexual activity: Yes  Other Topics Concern   Not on file  Social History Narrative   Lives at home with his family.   Right-handed.   No caffeine use.   One daughter and two sons.   Social Determinants of Health   Financial Resource Strain: Not on file  Food Insecurity: Not on file  Transportation Needs: Not on file  Physical Activity: Not on file  Stress: Not on file  Social Connections: Not on file     He is originally from [place], [place].  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  Prior to COVID he traveled extensively.  He is married for 59 years.  He has 3 children.  There is no Bacot use or alcohol use.  He is semiretired.  Family History:  The patient's family history includes Other in his father and mother.   His mother died at age 1.  Father died at age 71 and had pneumonia and fever.  He has a brother living at 27.  He has 1 living sister who is 70.  2 sisters are deceased 1 at age 52 after a fall fracture and another at age 66.  ROS General: Negative; No fevers, chills, or night sweats;  HEENT: Negative; No changes in vision or hearing, sinus congestion, difficulty swallowing Pulmonary: Idiopathic pulmonary fibrosis Cardiovascular: See HPI GI: Negative; No nausea, vomiting, diarrhea, or abdominal pain GU: Negative; No dysuria, hematuria, or difficulty voiding Musculoskeletal: Negative; no myalgias, joint pain, or  weakness Hematologic/Oncology: Negative; no easy bruising, bleeding Endocrine: Negative; no heat/cold intolerance; no diabetes Neuro: Negative; no changes in balance, headaches Skin: Negative; No rashes or skin lesions Psychiatric: Negative; No behavioral problems, depression Sleep: Negative; No snoring, daytime sleepiness, hypersomnolence, bruxism, restless legs, hypnogognic hallucinations, no cataplexy Other comprehensive 14 point system review is negative.   PHYSICAL EXAM:   VS:  BP 130/74   Pulse 70   Ht $R'5\' 7"'cp$  (1.702 m)   Wt 124 lb 3.2 oz (56.3 kg)   SpO2 99%   BMI 19.45 kg/m     Repeat blood pressure by me was 126/66  Wt Readings from Last 3 Encounters:  11/14/21 124 lb 3.2 oz (56.3 kg)  09/07/21 125 lb 9.6 oz (57 kg)  08/10/21 125 lb 10.6 oz (57 kg)   General: Alert, oriented, no distress.  Skin: normal turgor, no rashes, warm and dry HEENT: Normocephalic, atraumatic. Pupils equal round and reactive to light; sclera anicteric; extraocular muscles intact;  Nose without nasal septal hypertrophy Mouth/Parynx benign; Mallinpatti scale 3 Neck: No JVD, no carotid bruits; normal carotid upstroke Lungs: clear to ausculatation and percussion; no wheezing or rales Chest wall: without tenderness to palpitation Heart: PMI not displaced, RRR, s1 s2 normal, 1/6 systolic murmur, no diastolic murmur, no rubs, gallops, thrills, or heaves Abdomen: soft, nontender; no hepatosplenomehaly, BS+; abdominal aorta nontender and not dilated by palpation. Back: no CVA tenderness Pulses 2+ Musculoskeletal: full range of motion, normal strength, no joint deformities Extremities: no clubbing cyanosis or edema, Homan's sign negative  Neurologic: grossly nonfocal; Cranial nerves grossly wnl Psychologic: Normal mood and affect    Studies/Labs Reviewed:   November 14, 2021 ECG (independently read by me): NSR at 70, no ectopy, normal intervals  July 12, 2021 ECG (independently read by me): NSR at  71, possible LA enlargement  February 05, 2021 ECG (independently read by me): NSR at 82, Possible LA enlargement  November 29, 2020 ECG (independently read by me): Sinus tachycardia at 108; possible LAE, no ectopy  Recent Labs:    Latest Ref Rng & Units 11/14/2021    4:10 PM 09/07/2021   12:18 PM 11/30/2020    8:43 AM  BMP  Glucose 70 - 99 mg/dL 128  109  87   BUN 8 - 27 mg/dL $Remove'9  10  10   'cAcdzta$ Creatinine 0.76 - 1.27 mg/dL 0.90  0.96  1.00   BUN/Creat Ratio 10 - $Re'24 10   10   'qok$ Sodium 134 - 144 mmol/L 140  137  142   Potassium 3.5 - 5.2 mmol/L 3.7  4.3  4.6   Chloride 96 - 106 mmol/L 101  103  103   CO2 20 - 29 mmol/L $RemoveB'26  29  25   'ztvaZBdI$ Calcium 8.6 - 10.2 mg/dL 9.9  9.6  9.9         Latest Ref Rng & Units 09/26/2021    2:51 PM 07/19/2021    3:01 PM 02/26/2021    2:12 PM  Hepatic Function  Total Protein 6.0 - 8.3 g/dL 7.1  6.7  7.2   Albumin 3.5 - 5.2 g/dL 3.9  3.8  4.0   AST 0 - 37 U/L $Remo'18  18  18   'OCqwF$ ALT 0 - 53 U/L $Remo'12  13  14   'PIIVs$ Alk Phosphatase 39 - 117 U/L 49  47  54   Total Bilirubin 0.2 - 1.2 mg/dL 0.4  0.4  0.4   Bilirubin, Direct 0.0 - 0.3 mg/dL 0.1  0.1  0.1        Latest Ref Rng & Units 11/14/2021    4:10 PM 09/07/2021   12:18 PM 11/30/2020    8:43 AM  CBC  WBC 3.4 - 10.8 x10E3/uL 7.8  6.2  7.4   Hemoglobin 13.0 - 17.7 g/dL 13.7  13.7  14.7   Hematocrit 37.5 - 51.0 %  39.3  40.3  43.1   Platelets 150 - 450 x10E3/uL 171  150.0  188    Lab Results  Component Value Date   MCV 93 11/14/2021   MCV 96.4 09/07/2021   MCV 96 11/30/2020   No results found for: "TSH" No results found for: "HGBA1C"   BNP No results found for: "BNP"  ProBNP    Component Value Date/Time   PROBNP 211.0 (H) 09/07/2021 1218     Lipid Panel     Component Value Date/Time   CHOL 203 (H) 11/30/2020 0843   TRIG 75 11/30/2020 0843   HDL 60 11/30/2020 0843   CHOLHDL 3.4 11/30/2020 0843   LDLCALC 129 (H) 11/30/2020 0843   LABVLDL 14 11/30/2020 0843     RADIOLOGY: No results found.   Additional  studies/ records that were reviewed today include:   ECHO: 09/07/2020 IMPRESSIONS   1. Left ventricular ejection fraction, by estimation, is 60 to 65%. The  left ventricle has normal function. The left ventricle has no regional  wall motion abnormalities. Left ventricular diastolic parameters are  indeterminate.   2. Right ventricular systolic function is normal. The right ventricular  size is normal. There is mildly elevated pulmonary artery systolic  pressure. The estimated right ventricular systolic pressure is 40.9 mmHg.   3. The mitral valve is normal in structure. No evidence of mitral valve  regurgitation.   4. Tricuspid valve regurgitation is mild to moderate.   5. The aortic valve is tricuspid. Aortic valve regurgitation is not  visualized. No aortic stenosis is present.   6. The inferior vena cava is normal in size with greater than 50%  respiratory variability, suggesting right atrial pressure of 3 mmHg.    CT chest high-resolution: January 13, 2020 IMPRESSION: 1. The appearance of the lungs is compatible with progressive interstitial lung disease categorized as probable usual interstitial pneumonia (UIP) per current ATS guidelines. 2. Aortic atherosclerosis, in addition to left main and 2 vessel coronary artery disease. In addition, there is mild aneurysmal dilatation of the ascending thoracic aorta (4.5 cm in diameter). Ascending thoracic aortic aneurysm. Recommend semi-annual imaging followup by CTA or MRA and referral to cardiothoracic surgery if not already obtained. This recommendation follows 2010 ACCF/AHA/AATS/ACR/ASA/SCA/SCAI/SIR/STS/SVM Guidelines for the Diagnosis and Management of Patients With Thoracic Aortic Disease. Circulation. 2010; 121: W119-J478. Aortic aneurysm NOS (ICD10-I71.9).   Aortic Atherosclerosis (ICD10-I70.0). Aortic aneurysm NOS (ICD10-I71.9).   Coronary CTA: 12/08/2020 FINDINGS: Image quality: Adequate.   Noise artifact is: Mild  misregistration.   Coronary calcium score is 18, which places the patient in the 9th percentile for age and sex matched control. Comparison made to white males.   Coronary arteries: Normal coronary origins.  Right dominance.   Right Coronary Artery: Minimal atherosclerotic plaque in the mid RCA, <25% stenosis.   Left Main Coronary Artery: Minimal atherosclerotic plaque in the distal LMCA, <25% stenosis.   Left Anterior Descending Coronary Artery: Mild atherosclerotic plaque at the ostial LAD, 25-49% stenosis. Mild tubular atherosclerotic plaque in the proximal LAD, 25-49% stenosis, but appears to be approaching moderate stenosis.   Left Circumflex Artery: Minimal atherosclerotic plaque in the proximal L circumflex artery, <25% stenosis.   Aorta:   Measurements made in double oblique technique, sinus to sinus:   Sinus of Valsalva:   R-L: 38 mm   L-Non: 37 mm   R-Non: 35 mm   Mid ascending aorta (at PA bifurcation): 43 mm, mild dilation of ascending aorta.   Mid descending aorta (at level  of pulmonary veins): 21 mm   Aortic Valve: Tricuspid aortic valve, no calcifications.   Other findings:   Normal pulmonary vein drainage into the left atrium.   Normal left atrial appendage without a thrombus.   Normal size of the pulmonary artery.   IMPRESSION: 1. Mild CAD in ostial and proximal LAD, CADRADS = 2.   2. Coronary calcium score is 18, which places the patient in the 9th percentile for age and sex matched control.   3. Normal coronary origin with right dominance.   4. Mild dilation of ascending thoracic aorta, 43 mm at mid ascending aorta.   5.  Tricuspid aortic valve    ECHO: 10/10/2021  1. Left ventricular ejection fraction, by estimation, is 65 to 70%. The  left ventricle has normal function. The left ventricle has no regional  wall motion abnormalities. Left ventricular diastolic parameters are  consistent with Grade I diastolic  dysfunction  (impaired relaxation).   2. Right ventricular systolic function is normal. The right ventricular  size is normal. There is mildly elevated pulmonary artery systolic  pressure. The estimated right ventricular systolic pressure is 26.8 mmHg.   3. The mitral valve is grossly normal. Trivial mitral valve  regurgitation.   4. The aortic valve is tricuspid. Aortic valve regurgitation is not  visualized.   5. The inferior vena cava is normal in size with greater than 50%  respiratory variability, suggesting right atrial pressure of 3 mmHg.   Comparison(s): Changes from prior study are noted. 09/07/2020: LVDF 60-65%,  RVSP 34.8 mmHg.   ASSESSMENT:    1. Mild CAD   2. Idiopathic pulmonary fibrosis ([place])   3. Hyperlipidemia with target LDL less than 70   4. Aneurysm of ascending aorta without rupture ([place])   5. Gastroesophageal reflux disease without esophagitis     PLAN:  Mr.Macdonald Matsuo  is a young appearing 81 year-old gentleman who is originally from [place] and has lived in the [place] for the last 51 years.  He has had issues with chronic cough as well as weight loss.  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 Chase Caller.  Remotely, he was on nintedanib but due to some progression he is now on pirfenidone.  He has been documented to have mild dilation of his ascending aorta in addition to atherosclerosis involving his left main into coronary vessels.  Coronary CTA showed only mild coronary calcification with a score of 18 representing only 9% for age and sex matched control.  There was mild coronary plaque at the ostium and proximal LAD with minimal plaque elsewhere.  He has been treated with lipid-lowering therapy and laboratory in September 2022 showed an LDL at 13 he had recently been reevaluated by Dr. Chase Caller.  Due to concerns on his 2D echo Doppler study that RV systolic pressure had risen to 39.2 it was recommended that he undergo  right heart cardiac catheterization.  I discussed the right heart catheterization with him in detail today.  I discussed the risk benefits of the procedure.  He will undergo preoperative laboratory and we will plan to perform this procedure on November 22, 2021.  Presently, his blood pressure and heart rate are stable on his current regimen.  He continues to be on pantoprazole for GERD, metoprolol succinate 25 mg daily in addition to rosuvastatin 10 mg.   Medication Adjustments/Labs and Tests Ordered: Current medicines are reviewed at length with the patient today.  Concerns regarding  medicines are outlined above.  Medication changes, Labs and Tests ordered today are listed in the Patient Instructions below. Patient Instructions  Medication Instructions:  No changes *If you need a refill on your cardiac medications before your next appointment, please call your pharmacy*  Testing/Procedures: Your physician has requested that you have a cardiac catheterization. Cardiac catheterization is used to diagnose and/or treat various heart conditions. Doctors may recommend this procedure for a number of different reasons. The most common reason is to evaluate chest pain. Chest pain can be a symptom of coronary artery disease (CAD), and cardiac catheterization can show whether plaque is narrowing or blocking your heart's arteries. This procedure is also used to evaluate the valves, as well as measure the blood flow and oxygen levels in different parts of your heart. For further information please visit [email or web address]. Please follow instruction sheet, as given.   Follow-Up: At [facility], you and your health needs are our priority.  As part of our continuing mission to provide you with exceptional heart care, we have created designated Provider Care Teams.  These Care Teams include your primary Cardiologist (physician) and Advanced Practice Providers (APPs -  Physician Assistants and Nurse Practitioners) who  all work together to provide you with the care you need, when you need it.  We recommend signing up for the patient portal called "MyChart".  Sign up information is provided on this After Visit Summary.  MyChart is used to connect with patients for Virtual Visits (Telemedicine).  Patients are able to view lab/test results, encounter notes, upcoming appointments, etc.  Non-urgent messages can be sent to your provider as well.   To learn more about what you can do with MyChart, go to [email or web address].    Your next appointment:   1 month(s)  The format for your next appointment:   In Person  Provider:   Dr. Claiborne Billings or APP  Other [place] [address] Dept: [phone] Loc: [place]  11/14/2021  You are scheduled for a Cardiac Catheterization on Thursday, July 27 with Dr. Shelva Majestic.  1. Please arrive at the Main Entrance A at [facility]: [place], [place] [address] at 5:30 AM (This time is two hours before your procedure to ensure your preparation). Free valet parking service is available.   Special note: Every effort is made to have your procedure done on time. Please understand that emergencies sometimes delay scheduled procedures.  2. Diet: Do not eat solid foods after midnight.  You may have clear liquids until 5 AM upon the day of the procedure.  3. Labs: You will need to have blood drawn on 7/18.You do not need to be fasting.  4. Medication instructions in preparation for your procedure: Nothing to hold  On the morning of your procedure, take Aspirin and any morning medicines NOT listed above.  You may use sips of water.  5. Plan to go home the same day, you will only stay overnight if medically necessary. 6. You MUST have a responsible adult to drive you home. 7. An adult MUST be with you  the first 24 hours after you arrive home. 8. Bring a current list of your medications, and the last time and date medication taken. 9. Bring ID and current insurance cards. 10.Please wear clothes that are easy to get on and off and wear slip-on shoes.  Thank you for  allowing [place] to care for you!   -- [facility] Invasive Cardiovascular services      Signed, Shelva Majestic, MD  11/17/2021 5:03 PM    [facility] [address] [place], [place], [place]  [address] Phone: [phone]

## 2021-11-15 LAB — CBC
Hematocrit: 39.3 % (ref 37.5–51.0)
Hemoglobin: 13.7 g/dL (ref 13.0–17.7)
MCH: 32.5 pg (ref 26.6–33.0)
MCHC: 34.9 g/dL (ref 31.5–35.7)
MCV: 93 fL (ref 79–97)
Platelets: 171 10*3/uL (ref 150–450)
RBC: 4.22 x10E6/uL (ref 4.14–5.80)
RDW: 12.2 % (ref 11.6–15.4)
WBC: 7.8 10*3/uL (ref 3.4–10.8)

## 2021-11-15 LAB — BASIC METABOLIC PANEL
BUN/Creatinine Ratio: 10 (ref 10–24)
BUN: 9 mg/dL (ref 8–27)
CO2: 26 mmol/L (ref 20–29)
Calcium: 9.9 mg/dL (ref 8.6–10.2)
Chloride: 101 mmol/L (ref 96–106)
Creatinine, Ser: 0.9 mg/dL (ref 0.76–1.27)
Glucose: 128 mg/dL — ABNORMAL HIGH (ref 70–99)
Potassium: 3.7 mmol/L (ref 3.5–5.2)
Sodium: 140 mmol/L (ref 134–144)
eGFR: 86 mL/min/{1.73_m2} (ref >59)

## 2021-11-17 ENCOUNTER — Encounter: Payer: Self-pay | Admitting: Cardiovascular Disease

## 2021-11-19 ENCOUNTER — Telehealth: Payer: Self-pay | Admitting: Internal Medicine

## 2021-11-19 NOTE — Telephone Encounter (Signed)
Called patient and he is wondering if he needs to do a cardiac cath?  Please advise sir

## 2021-11-19 NOTE — Telephone Encounter (Signed)
If right heart p[ressures are high, there is Rx for it. So RHC is indicated based on echo results. Thre re risks, benefits and limitatins of each procedure .

## 2021-11-21 ENCOUNTER — Telehealth: Payer: Self-pay | Admitting: *Deleted

## 2021-11-21 NOTE — Telephone Encounter (Signed)
Right Heart Cath  scheduled at [facility] for: Thursday November 22, 2021 7:30 AM Arrival time and place: [place] Entrance A at: 5:30 AM   Nothing to eat after midnight prior to procedure, clear liquids until 5 AM day of procedure.  Medication instructions: -Usual morning medications can be taken with sips of water.  Confirmed patient has responsible adult to drive home post procedure and be with patient first 24 hours after arriving home.  Patient reports no new symptoms concerning for COVID-19 in the past 10 days.  Reviewed procedure instructions with patient.

## 2021-11-22 ENCOUNTER — Other Ambulatory Visit: Payer: Self-pay

## 2021-11-22 ENCOUNTER — Encounter (HOSPITAL_COMMUNITY): Payer: Self-pay | Admitting: Cardiovascular Disease

## 2021-11-22 ENCOUNTER — Encounter (HOSPITAL_COMMUNITY)
Admission: RE | Disposition: A | Discharge status: Home / Self Care | Payer: Self-pay | Source: Home / Self Care | Attending: Cardiovascular Disease

## 2021-11-22 ENCOUNTER — Ambulatory Visit (HOSPITAL_COMMUNITY)
Admission: RE | Admit: 2021-11-22 | Discharge: 2021-11-22 | Disposition: A | Discharge status: Home / Self Care | Payer: Medicare Other | Attending: Cardiovascular Disease | Admitting: Cardiovascular Disease

## 2021-11-22 DIAGNOSIS — I2510 Atherosclerotic heart disease of native coronary artery without angina pectoris: Secondary | ICD-10-CM | POA: Diagnosis not present

## 2021-11-22 DIAGNOSIS — J84112 Idiopathic pulmonary fibrosis: Secondary | ICD-10-CM | POA: Insufficient documentation

## 2021-11-22 DIAGNOSIS — I700 Atherosclerosis of aorta: Secondary | ICD-10-CM | POA: Diagnosis not present

## 2021-11-22 DIAGNOSIS — K219 Gastro-esophageal reflux disease without esophagitis: Secondary | ICD-10-CM | POA: Diagnosis not present

## 2021-11-22 DIAGNOSIS — I7121 Aneurysm of the ascending aorta, without rupture: Secondary | ICD-10-CM | POA: Insufficient documentation

## 2021-11-22 DIAGNOSIS — E785 Hyperlipidemia, unspecified: Secondary | ICD-10-CM | POA: Diagnosis not present

## 2021-11-22 HISTORY — PX: RIGHT HEART CATH: CATH118263

## 2021-11-22 LAB — POCT I-STAT EG7
Acid-Base Excess: 0 mmol/L (ref 0.0–2.0)
Acid-Base Excess: 0 mmol/L (ref 0.0–2.0)
Bicarbonate: 27.1 mmol/L (ref 20.0–28.0)
Bicarbonate: 27.2 mmol/L (ref 20.0–28.0)
Calcium, Ion: 1.3 mmol/L (ref 1.15–1.40)
Calcium, Ion: 1.31 mmol/L (ref 1.15–1.40)
HCT: 38 % — ABNORMAL LOW (ref 39.0–52.0)
HCT: 38 % — ABNORMAL LOW (ref 39.0–52.0)
Hemoglobin: 12.9 g/dL — ABNORMAL LOW (ref 13.0–17.0)
Hemoglobin: 12.9 g/dL — ABNORMAL LOW (ref 13.0–17.0)
O2 Saturation: 66 %
O2 Saturation: 69 %
Potassium: 4 mmol/L (ref 3.5–5.1)
Potassium: 4 mmol/L (ref 3.5–5.1)
Sodium: 139 mmol/L (ref 135–145)
Sodium: 140 mmol/L (ref 135–145)
TCO2: 29 mmol/L (ref 22–32)
TCO2: 29 mmol/L (ref 22–32)
pCO2, Ven: 51.6 mmHg (ref 44–60)
pCO2, Ven: 51.7 mmHg (ref 44–60)
pH, Ven: 7.328 (ref 7.25–7.43)
pH, Ven: 7.33 (ref 7.25–7.43)
pO2, Ven: 37 mmHg (ref 32–45)
pO2, Ven: 40 mmHg (ref 32–45)

## 2021-11-22 SURGERY — RIGHT HEART CATH

## 2021-11-22 MED ORDER — MIDAZOLAM HCL 2 MG/2ML IJ SOLN
INTRAMUSCULAR | Status: DC | PRN
  Administered 2021-11-22: 2 mg via INTRAVENOUS

## 2021-11-22 MED ORDER — HEPARIN (PORCINE) IN NACL 1000-0.9 UT/500ML-% IV SOLN
INTRAVENOUS | Status: DC | PRN
  Administered 2021-11-22: 500 mL

## 2021-11-22 MED ORDER — SODIUM CHLORIDE 0.9% FLUSH
3.0000 mL | INTRAVENOUS | Status: DC | PRN
  Administered 2021-11-22: 3 mL via INTRAVENOUS

## 2021-11-22 MED ORDER — MIDAZOLAM HCL 2 MG/2ML IJ SOLN
INTRAMUSCULAR | Status: AC
  Filled 2021-11-22: qty 2

## 2021-11-22 MED ORDER — LIDOCAINE HCL (PF) 1 % IJ SOLN
INTRAMUSCULAR | Status: DC | PRN
  Administered 2021-11-22: 2 mL via INTRADERMAL

## 2021-11-22 MED ORDER — LABETALOL HCL 5 MG/ML IV SOLN: 10.0000 mg | INTRAVENOUS | Status: DC | PRN

## 2021-11-22 MED ORDER — FENTANYL CITRATE (PF) 100 MCG/2ML IJ SOLN
INTRAMUSCULAR | Status: DC | PRN
  Administered 2021-11-22: 25 ug via INTRAVENOUS

## 2021-11-22 MED ORDER — SODIUM CHLORIDE 0.9% FLUSH: 3.0000 mL | INTRAVENOUS | Status: DC | PRN

## 2021-11-22 MED ORDER — SODIUM CHLORIDE 0.9 % IV SOLN: 250.0000 mL | INTRAVENOUS | Status: DC | PRN

## 2021-11-22 MED ORDER — ONDANSETRON HCL 4 MG/2ML IJ SOLN: 4.0000 mg | Freq: Four times a day (QID) | INTRAMUSCULAR | Status: DC | PRN

## 2021-11-22 MED ORDER — HYDRALAZINE HCL 20 MG/ML IJ SOLN: 10.0000 mg | INTRAMUSCULAR | Status: DC | PRN

## 2021-11-22 MED ORDER — ASPIRIN 81 MG PO CHEW
CHEWABLE_TABLET | ORAL | Status: AC
  Administered 2021-11-22: 81 mg
  Filled 2021-11-22: qty 1

## 2021-11-22 MED ORDER — ACETAMINOPHEN 325 MG PO TABS
650.0000 mg | ORAL_TABLET | ORAL | Status: DC | PRN
Start: 2021-11-22 — End: 2021-11-22

## 2021-11-22 MED ORDER — LIDOCAINE HCL (PF) 1 % IJ SOLN
INTRAMUSCULAR | Status: AC
  Filled 2021-11-22: qty 30

## 2021-11-22 MED ORDER — SODIUM CHLORIDE 0.9 % IV SOLN: Freq: Once | INTRAVENOUS | Status: AC

## 2021-11-22 MED ORDER — SODIUM CHLORIDE 0.9% FLUSH: 3.0000 mL | Freq: Two times a day (BID) | INTRAVENOUS | Status: DC

## 2021-11-22 MED ORDER — FENTANYL CITRATE (PF) 100 MCG/2ML IJ SOLN
INTRAMUSCULAR | Status: AC
  Filled 2021-11-22: qty 2

## 2021-11-22 SURGICAL SUPPLY — 5 items
CATH BALLN WEDGE 5F 110CM (CATHETERS) ×1 IMPLANT
GUIDEWIRE .025 260CM (WIRE) ×1 IMPLANT
KIT HEART LEFT (KITS) ×1 IMPLANT
PACK CARDIAC CATHETERIZATION (CUSTOM PROCEDURE TRAY) ×1 IMPLANT
SHEATH GLIDE SLENDER 4/5FR (SHEATH) ×1 IMPLANT

## 2021-11-22 NOTE — Interval H&P Note (Signed)
History and Physical Interval Note:  11/22/2021 7:43 AM  Jeff Willis  has presented today for surgery, with the diagnosis of idiopathic pulmonary fibrosis.  The various methods of treatment have been discussed with the patient and family. After consideration of risks, benefits and other options for treatment, the patient has consented to  Procedure(s): RIGHT HEART CATH (N/A) as a surgical intervention.  The patient's history has been reviewed, patient examined, no change in status, stable for surgery.  I have reviewed the patient's chart and labs.  Questions were answered to the patient's satisfaction.     Shelva Majestic

## 2021-11-22 NOTE — Telephone Encounter (Signed)
Patient is currently having the right heart cath completed. Will close encounter.

## 2021-11-30 ENCOUNTER — Other Ambulatory Visit: Payer: Self-pay | Admitting: Thoracic Surgery (Cardiothoracic Vascular Surgery)

## 2021-11-30 DIAGNOSIS — I7121 Aneurysm of the ascending aorta, without rupture: Secondary | ICD-10-CM

## 2021-12-13 ENCOUNTER — Ambulatory Visit: Payer: Medicare Other | Admitting: General Practice

## 2021-12-17 ENCOUNTER — Ambulatory Visit
Admission: RE | Admit: 2021-12-17 | Discharge: 2021-12-17 | Disposition: A | Discharge status: Home / Self Care | Payer: Medicare Other | Source: Ambulatory Visit | Attending: Internal Medicine | Admitting: Internal Medicine

## 2021-12-17 DIAGNOSIS — J84112 Idiopathic pulmonary fibrosis: Secondary | ICD-10-CM

## 2021-12-18 NOTE — Progress Notes (Signed)
IPF slowly getting worse. Will discuss at followup

## 2021-12-30 ENCOUNTER — Other Ambulatory Visit: Payer: Self-pay

## 2021-12-30 ENCOUNTER — Telehealth: Payer: Self-pay | Admitting: Internal Medicine

## 2021-12-30 ENCOUNTER — Inpatient Hospital Stay (HOSPITAL_COMMUNITY)
Admission: EM | Admit: 2021-12-30 | Discharge: 2022-01-18 | DRG: 208 | Disposition: A | Discharge status: Rehabilitation Facility | Payer: Medicare Other | Attending: Internal Medicine | Admitting: Internal Medicine

## 2021-12-30 ENCOUNTER — Encounter: Payer: Self-pay | Admitting: Internal Medicine

## 2021-12-30 ENCOUNTER — Emergency Department (HOSPITAL_COMMUNITY): Payer: Medicare Other

## 2021-12-30 ENCOUNTER — Encounter (HOSPITAL_COMMUNITY): Payer: Self-pay

## 2021-12-30 DIAGNOSIS — E441 Mild protein-calorie malnutrition: Secondary | ICD-10-CM | POA: Diagnosis present

## 2021-12-30 DIAGNOSIS — E876 Hypokalemia: Secondary | ICD-10-CM | POA: Diagnosis not present

## 2021-12-30 DIAGNOSIS — R627 Adult failure to thrive: Secondary | ICD-10-CM | POA: Diagnosis present

## 2021-12-30 DIAGNOSIS — E8729 Other acidosis: Secondary | ICD-10-CM | POA: Diagnosis present

## 2021-12-30 DIAGNOSIS — M48061 Spinal stenosis, lumbar region without neurogenic claudication: Secondary | ICD-10-CM | POA: Diagnosis present

## 2021-12-30 DIAGNOSIS — M5126 Other intervertebral disc displacement, lumbar region: Secondary | ICD-10-CM | POA: Diagnosis present

## 2021-12-30 DIAGNOSIS — R451 Restlessness and agitation: Secondary | ICD-10-CM | POA: Diagnosis not present

## 2021-12-30 DIAGNOSIS — Z7401 Bed confinement status: Secondary | ICD-10-CM

## 2021-12-30 DIAGNOSIS — Z66 Do not resuscitate: Secondary | ICD-10-CM | POA: Diagnosis not present

## 2021-12-30 DIAGNOSIS — R339 Retention of urine, unspecified: Secondary | ICD-10-CM | POA: Diagnosis present

## 2021-12-30 DIAGNOSIS — I248 Other forms of acute ischemic heart disease: Secondary | ICD-10-CM | POA: Diagnosis present

## 2021-12-30 DIAGNOSIS — E871 Hypo-osmolality and hyponatremia: Secondary | ICD-10-CM | POA: Diagnosis present

## 2021-12-30 DIAGNOSIS — R042 Hemoptysis: Secondary | ICD-10-CM | POA: Diagnosis not present

## 2021-12-30 DIAGNOSIS — R197 Diarrhea, unspecified: Secondary | ICD-10-CM | POA: Diagnosis present

## 2021-12-30 DIAGNOSIS — R7989 Other specified abnormal findings of blood chemistry: Secondary | ICD-10-CM | POA: Diagnosis present

## 2021-12-30 DIAGNOSIS — R64 Cachexia: Secondary | ICD-10-CM | POA: Diagnosis present

## 2021-12-30 DIAGNOSIS — R34 Anuria and oliguria: Secondary | ICD-10-CM | POA: Diagnosis not present

## 2021-12-30 DIAGNOSIS — F419 Anxiety disorder, unspecified: Secondary | ICD-10-CM | POA: Diagnosis not present

## 2021-12-30 DIAGNOSIS — A419 Sepsis, unspecified organism: Principal | ICD-10-CM

## 2021-12-30 DIAGNOSIS — Z8679 Personal history of other diseases of the circulatory system: Secondary | ICD-10-CM

## 2021-12-30 DIAGNOSIS — R001 Bradycardia, unspecified: Secondary | ICD-10-CM | POA: Diagnosis not present

## 2021-12-30 DIAGNOSIS — T380X5A Adverse effect of glucocorticoids and synthetic analogues, initial encounter: Secondary | ICD-10-CM | POA: Diagnosis present

## 2021-12-30 DIAGNOSIS — L89312 Pressure ulcer of right buttock, stage 2: Secondary | ICD-10-CM | POA: Diagnosis not present

## 2021-12-30 DIAGNOSIS — Z681 Body mass index (BMI) 19 or less, adult: Secondary | ICD-10-CM

## 2021-12-30 DIAGNOSIS — R739 Hyperglycemia, unspecified: Secondary | ICD-10-CM | POA: Diagnosis present

## 2021-12-30 DIAGNOSIS — U071 COVID-19: Principal | ICD-10-CM | POA: Diagnosis present

## 2021-12-30 DIAGNOSIS — J189 Pneumonia, unspecified organism: Secondary | ICD-10-CM

## 2021-12-30 DIAGNOSIS — J9601 Acute respiratory failure with hypoxia: Secondary | ICD-10-CM | POA: Diagnosis not present

## 2021-12-30 DIAGNOSIS — R54 Age-related physical debility: Secondary | ICD-10-CM | POA: Diagnosis present

## 2021-12-30 DIAGNOSIS — R1314 Dysphagia, pharyngoesophageal phase: Secondary | ICD-10-CM | POA: Diagnosis present

## 2021-12-30 DIAGNOSIS — M47816 Spondylosis without myelopathy or radiculopathy, lumbar region: Secondary | ICD-10-CM | POA: Diagnosis present

## 2021-12-30 DIAGNOSIS — G8929 Other chronic pain: Secondary | ICD-10-CM | POA: Diagnosis present

## 2021-12-30 DIAGNOSIS — E43 Unspecified severe protein-calorie malnutrition: Secondary | ICD-10-CM | POA: Insufficient documentation

## 2021-12-30 DIAGNOSIS — J690 Pneumonitis due to inhalation of food and vomit: Secondary | ICD-10-CM | POA: Diagnosis not present

## 2021-12-30 DIAGNOSIS — J849 Interstitial pulmonary disease, unspecified: Secondary | ICD-10-CM | POA: Diagnosis present

## 2021-12-30 DIAGNOSIS — Z8546 Personal history of malignant neoplasm of prostate: Secondary | ICD-10-CM

## 2021-12-30 DIAGNOSIS — B9789 Other viral agents as the cause of diseases classified elsewhere: Secondary | ICD-10-CM | POA: Diagnosis present

## 2021-12-30 DIAGNOSIS — D638 Anemia in other chronic diseases classified elsewhere: Secondary | ICD-10-CM | POA: Diagnosis present

## 2021-12-30 DIAGNOSIS — M79604 Pain in right leg: Secondary | ICD-10-CM | POA: Diagnosis not present

## 2021-12-30 DIAGNOSIS — J84112 Idiopathic pulmonary fibrosis: Secondary | ICD-10-CM | POA: Diagnosis present

## 2021-12-30 DIAGNOSIS — J1282 Pneumonia due to coronavirus disease 2019: Secondary | ICD-10-CM | POA: Diagnosis present

## 2021-12-30 DIAGNOSIS — I959 Hypotension, unspecified: Secondary | ICD-10-CM | POA: Diagnosis not present

## 2021-12-30 DIAGNOSIS — Z79899 Other long term (current) drug therapy: Secondary | ICD-10-CM

## 2021-12-30 DIAGNOSIS — G250 Essential tremor: Secondary | ICD-10-CM | POA: Diagnosis present

## 2021-12-30 DIAGNOSIS — Z8719 Personal history of other diseases of the digestive system: Secondary | ICD-10-CM

## 2021-12-30 DIAGNOSIS — Z923 Personal history of irradiation: Secondary | ICD-10-CM

## 2021-12-30 DIAGNOSIS — L8990 Pressure ulcer of unspecified site, unspecified stage: Secondary | ICD-10-CM | POA: Insufficient documentation

## 2021-12-30 DIAGNOSIS — Z7982 Long term (current) use of aspirin: Secondary | ICD-10-CM

## 2021-12-30 DIAGNOSIS — I4891 Unspecified atrial fibrillation: Secondary | ICD-10-CM | POA: Diagnosis present

## 2021-12-30 DIAGNOSIS — E7800 Pure hypercholesterolemia, unspecified: Secondary | ICD-10-CM | POA: Diagnosis present

## 2021-12-30 DIAGNOSIS — R140 Abdominal distension (gaseous): Secondary | ICD-10-CM | POA: Diagnosis not present

## 2021-12-30 DIAGNOSIS — J80 Acute respiratory distress syndrome: Secondary | ICD-10-CM | POA: Diagnosis present

## 2021-12-30 DIAGNOSIS — E875 Hyperkalemia: Secondary | ICD-10-CM | POA: Diagnosis not present

## 2021-12-30 DIAGNOSIS — Z0184 Encounter for antibody response examination: Secondary | ICD-10-CM

## 2021-12-30 DIAGNOSIS — I10 Essential (primary) hypertension: Secondary | ICD-10-CM | POA: Diagnosis present

## 2021-12-30 LAB — COMPREHENSIVE METABOLIC PANEL
ALT: 28 U/L (ref 0–44)
AST: 37 U/L (ref 15–41)
Albumin: 2.4 g/dL — ABNORMAL LOW (ref 3.5–5.0)
Alkaline Phosphatase: 57 U/L (ref 38–126)
Anion gap: 10 (ref 5–15)
BUN: 8 mg/dL (ref 8–23)
CO2: 23 mmol/L (ref 22–32)
Calcium: 8.5 mg/dL — ABNORMAL LOW (ref 8.9–10.3)
Chloride: 95 mmol/L — ABNORMAL LOW (ref 98–111)
Creatinine, Ser: 0.95 mg/dL (ref 0.61–1.24)
GFR, Estimated: >60 mL/min (ref >60)
Glucose, Bld: 180 mg/dL — ABNORMAL HIGH (ref 70–99)
Potassium: 3.6 mmol/L (ref 3.5–5.1)
Sodium: 128 mmol/L — ABNORMAL LOW (ref 135–145)
Total Bilirubin: 0.6 mg/dL (ref 0.3–1.2)
Total Protein: 5.8 g/dL — ABNORMAL LOW (ref 6.5–8.1)

## 2021-12-30 LAB — LACTATE DEHYDROGENASE: LDH: 275 U/L — ABNORMAL HIGH (ref 98–192)

## 2021-12-30 LAB — CBC WITH DIFFERENTIAL/PLATELET
Abs Immature Granulocytes: 0.16 10*3/uL — ABNORMAL HIGH (ref 0.00–0.07)
Basophils Absolute: 0 10*3/uL (ref 0.0–0.1)
Basophils Relative: 0 %
Eosinophils Absolute: 0 10*3/uL (ref 0.0–0.5)
Eosinophils Relative: 0 %
HCT: 35.7 % — ABNORMAL LOW (ref 39.0–52.0)
Hemoglobin: 12.6 g/dL — ABNORMAL LOW (ref 13.0–17.0)
Immature Granulocytes: 2 %
Lymphocytes Relative: 5 %
Lymphs Abs: 0.6 10*3/uL — ABNORMAL LOW (ref 0.7–4.0)
MCH: 32.5 pg (ref 26.0–34.0)
MCHC: 35.3 g/dL (ref 30.0–36.0)
MCV: 92 fL (ref 80.0–100.0)
Monocytes Absolute: 0.7 10*3/uL (ref 0.1–1.0)
Monocytes Relative: 7 %
Neutro Abs: 9 10*3/uL — ABNORMAL HIGH (ref 1.7–7.7)
Neutrophils Relative %: 86 %
Platelets: 224 10*3/uL (ref 150–400)
RBC: 3.88 MIL/uL — ABNORMAL LOW (ref 4.22–5.81)
RDW: 12.3 % (ref 11.5–15.5)
WBC: 10.5 10*3/uL (ref 4.0–10.5)
nRBC: 0 % (ref 0.0–0.2)

## 2021-12-30 LAB — TRIGLYCERIDES: Triglycerides: 51 mg/dL (ref <150)

## 2021-12-30 LAB — FERRITIN: Ferritin: 143 ng/mL (ref 24–336)

## 2021-12-30 LAB — LACTIC ACID, PLASMA: Lactic Acid, Venous: 1.9 mmol/L (ref 0.5–1.9)

## 2021-12-30 LAB — FIBRINOGEN: Fibrinogen: 670 mg/dL — ABNORMAL HIGH (ref 210–475)

## 2021-12-30 LAB — PROTIME-INR
INR: 1.2 (ref 0.8–1.2)
Prothrombin Time: 15.1 seconds (ref 11.4–15.2)

## 2021-12-30 LAB — C-REACTIVE PROTEIN: CRP: 23.5 mg/dL — ABNORMAL HIGH (ref <1.0)

## 2021-12-30 LAB — APTT: aPTT: 35 seconds (ref 24–36)

## 2021-12-30 MED ORDER — SODIUM CHLORIDE 0.9 % IV SOLN
2.0000 g | INTRAVENOUS | Status: AC
  Administered 2021-12-30 – 2022-01-03 (×5): 2 g via INTRAVENOUS
  Filled 2021-12-30 (×5): qty 20

## 2021-12-30 MED ORDER — ALBUTEROL SULFATE (2.5 MG/3ML) 0.083% IN NEBU: 10.0000 mg/h | INHALATION_SOLUTION | RESPIRATORY_TRACT | Status: AC

## 2021-12-30 MED ORDER — LACTATED RINGERS IV SOLN: INTRAVENOUS | Status: DC

## 2021-12-30 MED ORDER — SODIUM CHLORIDE 0.9 % IV SOLN
500.0000 mg | INTRAVENOUS | Status: AC
  Administered 2021-12-30 – 2022-01-03 (×5): 500 mg via INTRAVENOUS
  Filled 2021-12-30 (×5): qty 5

## 2021-12-30 NOTE — ED Triage Notes (Signed)
Pt BIB EMS. Pt was on cruise and tested positive for Covid on Aug 27th - Got back today. Pt has hx of pulmonary fibrosis, family reports pt has been hypoxic and coughing all day. Family spoke to pt doctor who wanted him to be seen.  Pt was 83% on room air, HR 150s, febrile at 100.4 with EMS.

## 2021-12-30 NOTE — Telephone Encounter (Signed)
Talked to patient Son. Arrived from [place] from [place], tested Covid +, worsening dry cough. Saturation 75%. Awake. IPF hx. Sees Dr Lynford Citizen.  Advised to call stat EMS/ambulance and come to ED.  Notified Dr Lynford Citizen, who is on call tonight.

## 2021-12-30 NOTE — ED Provider Notes (Signed)
[facility] EMERGENCY DEPARTMENT Provider Note   CSN: [id number] Arrival date & time: 12/30/21  2149     History  Chief Complaint  Patient presents with   Covid Positive   Hypoxic   Tachycardia    Jeff Willis is a 81 y.o. male.  HPI   81 y/o male - hx of IPF -, hypertension, high cholesterol, presents to the hospital with severe shortness of breath after being diagnosed with COVID-19 while he was on a cruise ship.  He states that he was diagnosed about 1 week ago and while he was on the cruciate he did require some intermittent oxygen.  Over the last couple of days his symptoms have gradually worsened and when he got off the crew ship today his oxygen level was 75%.  Paramedics found him at 83% on room air with a heart rate in the 150s and a temperature of 100.4.  He was given Tylenol prehospital.  The patient has pulmonary fibrosis he does not use breathing treatments or bronchodilators at all and has done very well over time.  He does follow with pulmonary and his pulmonologist when contacted today told him to come to the ER for evaluation and potential admission if symptoms were severe.  He denies nausea or vomiting but has had no appetite, he has had occasional diarrhea.  Home Medications Prior to Admission medications   Medication Sig Start Date End Date Taking? Authorizing Provider  aspirin EC 81 MG tablet Take 81 mg by mouth daily. Swallow whole.    [provider]  Cyanocobalamin (VITAMIN B-12 PO) Take 1 tablet by mouth 3 (three) times a week.    [provider]  metoprolol succinate (TOPROL XL) 25 MG 24 hr tablet Take 1 tablet (25 mg total) by mouth daily. 02/05/21   Troy Sine, MD  pantoprazole (PROTONIX) 40 MG tablet Take 40 mg by mouth daily as needed (heartburn). 12/16/19   [provider]  Pirfenidone (ESBRIET) 267 MG TABS Take 2 tablets (534 mg total) by mouth in the morning, at noon, and at bedtime. **LOW DOSE AS  MAINTENANCE** Patient taking differently: Take 267 mg by mouth in the morning, at noon, and at bedtime. **LOW DOSE AS MAINTENANCE** 10/25/21   Brand Males, MD  rosuvastatin (CRESTOR) 10 MG tablet Take 10 mg by mouth at bedtime. 10/04/21   [provider]  UNABLE TO FIND Take 1 Dose by mouth 2 (two) times daily. Med Name: Prime Remedy Care: [facility] Paste    [provider]  UNABLE TO FIND Take 1 Dose by mouth daily. Med Name: Customize Multiherb Multivitamin Solution    [provider]      Allergies    Ofev [nintedanib]    Review of Systems   Review of Systems  All other systems reviewed and are negative.   Physical Exam Updated Vital Signs BP 115/74   Pulse 100   Resp (!) 28   Ht 1.676 m ('5\' 6"'$ )   Wt 56.2 kg   SpO2 100%   BMI 20.01 kg/m  Physical Exam Vitals and nursing note reviewed.  Constitutional:      General: He is in acute distress.     Appearance: He is well-developed. He is ill-appearing.  HENT:     Head: Normocephalic and atraumatic.     Mouth/Throat:     Pharynx: No oropharyngeal exudate.  Eyes:     General: No scleral icterus.       Right  eye: No discharge.        Left eye: No discharge.     Conjunctiva/sclera: Conjunctivae normal.     Pupils: Pupils are equal, round, and reactive to light.  Neck:     Thyroid: No thyromegaly.     Vascular: No JVD.  Cardiovascular:     Rate and Rhythm: Regular rhythm. Tachycardia present.     Heart sounds: Normal heart sounds. No murmur heard.    No friction rub. No gallop.  Pulmonary:     Effort: Respiratory distress present.     Breath sounds: Wheezing and rales present.  Abdominal:     General: Bowel sounds are normal. There is no distension.     Palpations: Abdomen is soft. There is no mass.     Tenderness: There is no abdominal tenderness.  Musculoskeletal:        General: No tenderness. Normal range of motion.     Cervical back: Normal range of motion and neck supple.      Right lower leg: No edema.     Left lower leg: No edema.  Lymphadenopathy:     Cervical: No cervical adenopathy.  Skin:    General: Skin is warm and dry.     Findings: No erythema or rash.  Neurological:     Mental Status: He is alert.     Coordination: Coordination normal.  Psychiatric:        Behavior: Behavior normal.     ED Results / Procedures / Treatments   Labs (all labs ordered are listed, but only abnormal results are displayed) Labs Reviewed  CBC WITH DIFFERENTIAL/PLATELET - Abnormal; Notable for the following components:      Result Value   RBC 3.88 (*)    Hemoglobin 12.6 (*)    HCT 35.7 (*)    Neutro Abs 9.0 (*)    Lymphs Abs 0.6 (*)    Abs Immature Granulocytes 0.16 (*)    All other components within normal limits  RESP PANEL BY RT-PCR (FLU A&B, COVID) ARPGX2  CULTURE, BLOOD (ROUTINE X 2)  CULTURE, BLOOD (ROUTINE X 2)  URINE CULTURE  LACTIC ACID, PLASMA  LACTIC ACID, PLASMA  COMPREHENSIVE METABOLIC PANEL  FERRITIN  TRIGLYCERIDES  FIBRINOGEN  C-REACTIVE PROTEIN  PROCALCITONIN  LACTATE DEHYDROGENASE  PROTIME-INR  APTT  URINALYSIS, ROUTINE W REFLEX MICROSCOPIC    EKG EKG Interpretation  Date/Time:  Sunday December 30 2021 22:03:57 EDT Ventricular Rate:  137 PR Interval:  108 QRS Duration: 80 QT Interval:  287 QTC Calculation: 434 R Axis:   -50 Text Interpretation: Sinus tachycardia with irregular rate Left anterior fascicular block Low voltage, extremity leads Abnormal R-wave progression, late transition Confirmed by Noemi Chapel [phone]) on 12/30/2021 10:27:38 PM  Radiology DG Chest Port 1 View  Result Date: 12/30/2021 CLINICAL DATA:  cough, sob, known IPF, ? covid EXAM: PORTABLE CHEST 1 VIEW COMPARISON:  CT chest 12/17/2021, chest x-ray 01/30/2018 FINDINGS: Left lung border not visualized due to overlying lung findings. The heart and mediastinal contours are otherwise unchanged. Atherosclerotic plaque. Interval development of left lung  diffuse hazy airspace opacity with slight aeration of the left apex. Chronic coarsened markings with no overt pulmonary edema. No pleural effusion. No pneumothorax. No acute osseous abnormality. IMPRESSION: 1. Interval development of left lung diffuse hazy airspace opacity with slight aeration of the left apex. Underlying pulmonary fibrosis again noted. Recommend CT chest with intravenous contrast for further evaluation. 2.  Aortic Atherosclerosis (ICD10-I70.0). Electronically Signed   By: Iven Finn  M.D.   On: 12/30/2021 22:32    Procedures .Critical Care  Performed by: Noemi Chapel, MD Authorized by: Noemi Chapel, MD   Critical care provider statement:    Critical care time (minutes):  30   Critical care time was exclusive of:  Separately billable procedures and treating other patients and teaching time   Critical care was necessary to treat or prevent imminent or life-threatening deterioration of the following conditions:  Respiratory failure   Critical care was time spent personally by me on the following activities:  Development of treatment plan with patient or surrogate, discussions with consultants, evaluation of patient's response to treatment, examination of patient, ordering and review of laboratory studies, ordering and review of radiographic studies, ordering and performing treatments and interventions, pulse oximetry, re-evaluation of patient's condition, review of old charts and obtaining history from patient or surrogate   I assumed direction of critical care for this patient from another provider in my specialty: no     Care discussed with: admitting provider   Comments:           Medications Ordered in ED Medications  lactated ringers infusion ( Intravenous New Bag/Given 12/30/21 2253)  cefTRIAXone (ROCEPHIN) 2 g in sodium chloride 0.9 % 100 mL IVPB (2 g Intravenous New Bag/Given 12/30/21 2251)  azithromycin (ZITHROMAX) 500 mg in sodium chloride 0.9 % 250 mL IVPB (has no  administration in time range)  albuterol (PROVENTIL,VENTOLIN) solution continuous neb (has no administration in time range)    ED Course/ Medical Decision Making/ A&P                           Medical Decision Making Amount and/or Complexity of Data Reviewed Labs: ordered. Radiology: ordered. ECG/medicine tests: ordered.  Risk Prescription drug management.   This patient presents to the ED for concern of SOB, this involves an extensive number of treatment options, and is a complaint that carries with it a high risk of complications and morbidity.  The differential diagnosis includes Covid 19, PNA, Sepsis, PTX, worsening ILD.   Co morbidities that complicate the patient evaluation  Pulmonary fibrosis Calcium Score of 18, with mild CAD. CHF with grade 1 diastolic dysfunction   Additional history obtained:  Additional history obtained from EMR External records from outside source obtained and reviewed including Pulmnonary notes - Echo / Cath / CT coronary   Lab Tests:  I Ordered, and personally interpreted labs.  The pertinent results include:  COVID / Sepsis w/u.   Imaging Studies ordered:  I ordered imaging studies including Chest xray -   I independently visualized and interpreted imaging which showed asymmetrical pulmonary infiltrate on the left more than the right, some underlying pulmonary fibrosis present I agree with the radiologist interpretation   Cardiac Monitoring: / EKG:  The patient was maintained on a cardiac monitor.  I personally viewed and interpreted the cardiac monitored which showed an underlying rhythm of: Tachycardia with some irregularity, pulse in the 130's   Consultations Obtained:  I will request consultation with the hospitalist,  to discussed lab and imaging findings as well as pertinent plan -suspect need to admit to hospital.   Problem List / ED Course / Critical interventions / Medication management  This patient presents with what  appears to be pulmonary sepsis, there may be underlying COVID however he has a definite asymmetrical infiltrate with worsening sounds on the left side of his chest, he is dyspneic and requiring supplemental oxygen,  he was treated with IV antibiotics I ordered medication including albuterol, antibiotics for pneumonia and respiratory distress Reevaluation of the patient after these medicines showed that the patient critically ill I have reviewed the patients home medicines and have made adjustments as needed   Social Determinants of Health:  Critically ill with underlying pulmonary disease What appears to be pneumonia   Test / Admission - Considered:  We will admit to higher level of care At change of care - signed out to Dr. Sedonia Small to f/u results and admit to high level of care due to hypoxic respiratory failure and likely Sepsis.        Final Clinical Impression(s) / ED Diagnoses Final diagnoses:  Sepsis, due to unspecified organism, unspecified whether acute organ dysfunction present [facility])     Noemi Chapel, MD 12/30/21 2309

## 2021-12-30 NOTE — ED Provider Notes (Signed)
  Provider Note MRN:  [id number]  Arrival date & time: 12/31/21    ED Course and Medical Decision Making  Assumed care from Dr. Sabra Heck at shift change.  Concern for pulmonary sepsis, history of pulmonary fibrosis here with hypoxia, signs of pneumonia on x-ray, tachycardia.  Will need admission.  3 AM update: Patient has tested positive for COVID, there is also significant worsening of opacity on the left on CT worrisome for bacterial pneumonia.  Will admit to medicine.  Procedures  Final Clinical Impressions(s) / ED Diagnoses     ICD-10-CM   1. Sepsis, due to unspecified organism, unspecified whether acute organ dysfunction present ([place])  A41.9     2. Community acquired pneumonia, unspecified laterality  J18.9     3. COVID-19  U07.1       ED Discharge Orders     None       Discharge Instructions   None     Barth Kirks. Sedonia Small, [place] [email or web address]    Maudie Flakes, MD 12/31/21 [phone]

## 2021-12-31 ENCOUNTER — Emergency Department (HOSPITAL_COMMUNITY): Payer: Medicare Other

## 2021-12-31 ENCOUNTER — Inpatient Hospital Stay (HOSPITAL_COMMUNITY): Payer: Medicare Other

## 2021-12-31 DIAGNOSIS — J189 Pneumonia, unspecified organism: Secondary | ICD-10-CM

## 2021-12-31 DIAGNOSIS — J80 Acute respiratory distress syndrome: Secondary | ICD-10-CM | POA: Diagnosis present

## 2021-12-31 DIAGNOSIS — Z66 Do not resuscitate: Secondary | ICD-10-CM | POA: Diagnosis not present

## 2021-12-31 DIAGNOSIS — Z681 Body mass index (BMI) 19 or less, adult: Secondary | ICD-10-CM | POA: Diagnosis not present

## 2021-12-31 DIAGNOSIS — R0982 Postnasal drip: Secondary | ICD-10-CM | POA: Diagnosis not present

## 2021-12-31 DIAGNOSIS — R7989 Other specified abnormal findings of blood chemistry: Secondary | ICD-10-CM

## 2021-12-31 DIAGNOSIS — L89312 Pressure ulcer of right buttock, stage 2: Secondary | ICD-10-CM | POA: Diagnosis not present

## 2021-12-31 DIAGNOSIS — R1314 Dysphagia, pharyngoesophageal phase: Secondary | ICD-10-CM | POA: Diagnosis present

## 2021-12-31 DIAGNOSIS — E8729 Other acidosis: Secondary | ICD-10-CM | POA: Diagnosis present

## 2021-12-31 DIAGNOSIS — A419 Sepsis, unspecified organism: Secondary | ICD-10-CM | POA: Diagnosis not present

## 2021-12-31 DIAGNOSIS — J690 Pneumonitis due to inhalation of food and vomit: Secondary | ICD-10-CM | POA: Diagnosis not present

## 2021-12-31 DIAGNOSIS — J1282 Pneumonia due to coronavirus disease 2019: Secondary | ICD-10-CM | POA: Diagnosis present

## 2021-12-31 DIAGNOSIS — R64 Cachexia: Secondary | ICD-10-CM | POA: Diagnosis present

## 2021-12-31 DIAGNOSIS — J849 Interstitial pulmonary disease, unspecified: Secondary | ICD-10-CM

## 2021-12-31 DIAGNOSIS — J84112 Idiopathic pulmonary fibrosis: Secondary | ICD-10-CM | POA: Diagnosis present

## 2021-12-31 DIAGNOSIS — U071 COVID-19: Secondary | ICD-10-CM | POA: Diagnosis not present

## 2021-12-31 DIAGNOSIS — E441 Mild protein-calorie malnutrition: Secondary | ICD-10-CM | POA: Diagnosis present

## 2021-12-31 DIAGNOSIS — J9601 Acute respiratory failure with hypoxia: Secondary | ICD-10-CM | POA: Diagnosis present

## 2021-12-31 DIAGNOSIS — R627 Adult failure to thrive: Secondary | ICD-10-CM | POA: Diagnosis present

## 2021-12-31 DIAGNOSIS — I10 Essential (primary) hypertension: Secondary | ICD-10-CM | POA: Diagnosis present

## 2021-12-31 DIAGNOSIS — E43 Unspecified severe protein-calorie malnutrition: Secondary | ICD-10-CM | POA: Diagnosis not present

## 2021-12-31 DIAGNOSIS — M79604 Pain in right leg: Secondary | ICD-10-CM | POA: Diagnosis not present

## 2021-12-31 DIAGNOSIS — I248 Other forms of acute ischemic heart disease: Secondary | ICD-10-CM | POA: Diagnosis present

## 2021-12-31 DIAGNOSIS — R053 Chronic cough: Secondary | ICD-10-CM | POA: Diagnosis not present

## 2021-12-31 DIAGNOSIS — B348 Other viral infections of unspecified site: Secondary | ICD-10-CM | POA: Diagnosis not present

## 2021-12-31 DIAGNOSIS — I4891 Unspecified atrial fibrillation: Secondary | ICD-10-CM | POA: Diagnosis present

## 2021-12-31 DIAGNOSIS — I959 Hypotension, unspecified: Secondary | ICD-10-CM | POA: Diagnosis not present

## 2021-12-31 DIAGNOSIS — D638 Anemia in other chronic diseases classified elsewhere: Secondary | ICD-10-CM | POA: Diagnosis present

## 2021-12-31 DIAGNOSIS — E871 Hypo-osmolality and hyponatremia: Secondary | ICD-10-CM | POA: Diagnosis present

## 2021-12-31 DIAGNOSIS — R042 Hemoptysis: Secondary | ICD-10-CM | POA: Diagnosis not present

## 2021-12-31 DIAGNOSIS — Z8679 Personal history of other diseases of the circulatory system: Secondary | ICD-10-CM

## 2021-12-31 DIAGNOSIS — R5381 Other malaise: Secondary | ICD-10-CM | POA: Diagnosis not present

## 2021-12-31 DIAGNOSIS — R9431 Abnormal electrocardiogram [ECG] [EKG]: Secondary | ICD-10-CM | POA: Diagnosis not present

## 2021-12-31 DIAGNOSIS — R34 Anuria and oliguria: Secondary | ICD-10-CM | POA: Diagnosis not present

## 2021-12-31 DIAGNOSIS — R339 Retention of urine, unspecified: Secondary | ICD-10-CM | POA: Diagnosis not present

## 2021-12-31 DIAGNOSIS — J8410 Pulmonary fibrosis, unspecified: Secondary | ICD-10-CM | POA: Diagnosis not present

## 2021-12-31 DIAGNOSIS — R1319 Other dysphagia: Secondary | ICD-10-CM | POA: Diagnosis not present

## 2021-12-31 LAB — CBC WITH DIFFERENTIAL/PLATELET
Abs Immature Granulocytes: 0 10*3/uL (ref 0.00–0.07)
Basophils Absolute: 0 10*3/uL (ref 0.0–0.1)
Basophils Relative: 0 %
Eosinophils Absolute: 0 10*3/uL (ref 0.0–0.5)
Eosinophils Relative: 0 %
HCT: 34.8 % — ABNORMAL LOW (ref 39.0–52.0)
Hemoglobin: 12.3 g/dL — ABNORMAL LOW (ref 13.0–17.0)
Lymphocytes Relative: 7 %
Lymphs Abs: 0.5 10*3/uL — ABNORMAL LOW (ref 0.7–4.0)
MCH: 32.6 pg (ref 26.0–34.0)
MCHC: 35.3 g/dL (ref 30.0–36.0)
MCV: 92.3 fL (ref 80.0–100.0)
Monocytes Absolute: 0.1 10*3/uL (ref 0.1–1.0)
Monocytes Relative: 1 %
Neutro Abs: 6.6 10*3/uL (ref 1.7–7.7)
Neutrophils Relative %: 92 %
Platelets: 225 10*3/uL (ref 150–400)
RBC: 3.77 MIL/uL — ABNORMAL LOW (ref 4.22–5.81)
RDW: 12.5 % (ref 11.5–15.5)
WBC: 7.2 10*3/uL (ref 4.0–10.5)
nRBC: 0 % (ref 0.0–0.2)
nRBC: 0 /100 WBC

## 2021-12-31 LAB — RESPIRATORY PANEL BY PCR

## 2021-12-31 LAB — D-DIMER, QUANTITATIVE
D-Dimer, Quant: 1.75 ug/mL-FEU — ABNORMAL HIGH (ref 0.00–0.50)
D-Dimer, Quant: 1.64 ug/mL-FEU — ABNORMAL HIGH (ref 0.00–0.50)

## 2021-12-31 LAB — COMPREHENSIVE METABOLIC PANEL
ALT: 25 U/L (ref 0–44)
AST: 28 U/L (ref 15–41)
Albumin: 2.1 g/dL — ABNORMAL LOW (ref 3.5–5.0)
Alkaline Phosphatase: 58 U/L (ref 38–126)
Anion gap: 10 (ref 5–15)
BUN: 7 mg/dL — ABNORMAL LOW (ref 8–23)
CO2: 22 mmol/L (ref 22–32)
Calcium: 8.5 mg/dL — ABNORMAL LOW (ref 8.9–10.3)
Chloride: 102 mmol/L (ref 98–111)
Creatinine, Ser: 0.84 mg/dL (ref 0.61–1.24)
GFR, Estimated: >60 mL/min (ref >60)
Glucose, Bld: 203 mg/dL — ABNORMAL HIGH (ref 70–99)
Potassium: 4.1 mmol/L (ref 3.5–5.1)
Sodium: 134 mmol/L — ABNORMAL LOW (ref 135–145)
Total Bilirubin: 0.3 mg/dL (ref 0.3–1.2)
Total Protein: 5.7 g/dL — ABNORMAL LOW (ref 6.5–8.1)

## 2021-12-31 LAB — URINALYSIS, ROUTINE W REFLEX MICROSCOPIC
Bacteria, UA: NONE SEEN
Bilirubin Urine: NEGATIVE
Glucose, UA: NEGATIVE mg/dL
Hgb urine dipstick: NEGATIVE
Ketones, ur: NEGATIVE mg/dL
Leukocytes,Ua: NEGATIVE
Nitrite: NEGATIVE
Protein, ur: 30 mg/dL — AB
Specific Gravity, Urine: 1.006 (ref 1.005–1.030)
pH: 7 (ref 5.0–8.0)

## 2021-12-31 LAB — PROCALCITONIN: Procalcitonin: 0.99 ng/mL

## 2021-12-31 LAB — LACTIC ACID, PLASMA: Lactic Acid, Venous: 1.2 mmol/L (ref 0.5–1.9)

## 2021-12-31 LAB — LACTATE DEHYDROGENASE: LDH: 281 U/L — ABNORMAL HIGH (ref 98–192)

## 2021-12-31 LAB — RESP PANEL BY RT-PCR (FLU A&B, COVID) ARPGX2
Influenza A by PCR: NEGATIVE
Influenza B by PCR: NEGATIVE
SARS Coronavirus 2 by RT PCR: POSITIVE — AB

## 2021-12-31 LAB — MAGNESIUM: Magnesium: 1.9 mg/dL (ref 1.7–2.4)

## 2021-12-31 LAB — PHOSPHORUS: Phosphorus: 1.8 mg/dL — ABNORMAL LOW (ref 2.5–4.6)

## 2021-12-31 LAB — STREP PNEUMONIAE URINARY ANTIGEN: Strep Pneumo Urinary Antigen: NEGATIVE

## 2021-12-31 LAB — C-REACTIVE PROTEIN: CRP: 23.9 mg/dL — ABNORMAL HIGH (ref <1.0)

## 2021-12-31 LAB — FERRITIN: Ferritin: 127 ng/mL (ref 24–336)

## 2021-12-31 LAB — SEDIMENTATION RATE: Sed Rate: 75 mm/hr — ABNORMAL HIGH (ref 0–16)

## 2021-12-31 MED ORDER — IOHEXOL 300 MG/ML  SOLN
75.0000 mL | Freq: Once | INTRAMUSCULAR | Status: AC | PRN
  Administered 2021-12-31: 75 mL via INTRAVENOUS

## 2021-12-31 MED ORDER — ACETAMINOPHEN 325 MG PO TABS
650.0000 mg | ORAL_TABLET | Freq: Four times a day (QID) | ORAL | Status: DC | PRN
  Administered 2022-01-04 – 2022-01-11 (×4): 650 mg via ORAL
  Filled 2021-12-31 (×5): qty 2

## 2021-12-31 MED ORDER — ONDANSETRON HCL 4 MG PO TABS: 4.0000 mg | ORAL_TABLET | Freq: Four times a day (QID) | ORAL | Status: DC | PRN

## 2021-12-31 MED ORDER — TOCILIZUMAB 400 MG/20ML IV SOLN
8.0000 mg/kg | Freq: Once | INTRAVENOUS | Status: AC
  Administered 2021-12-31: 450 mg via INTRAVENOUS
  Filled 2021-12-31: qty 22.5

## 2021-12-31 MED ORDER — PREDNISONE 20 MG PO TABS
50.0000 mg | ORAL_TABLET | Freq: Every day | ORAL | Status: DC
  Filled 2021-12-31: qty 1

## 2021-12-31 MED ORDER — DM-GUAIFENESIN ER 30-600 MG PO TB12
1.0000 | ORAL_TABLET | Freq: Two times a day (BID) | ORAL | Status: DC
Start: 2021-12-31 — End: 2022-01-01
  Administered 2021-12-31 (×2): 1 via ORAL
  Filled 2021-12-31 (×3): qty 1

## 2021-12-31 MED ORDER — ENOXAPARIN SODIUM 40 MG/0.4ML IJ SOSY
40.0000 mg | PREFILLED_SYRINGE | INTRAMUSCULAR | Status: DC
  Administered 2021-12-31: 40 mg via SUBCUTANEOUS
  Filled 2021-12-31 (×2): qty 0.4

## 2021-12-31 MED ORDER — SODIUM CHLORIDE 0.9 % IV SOLN
200.0000 mg | Freq: Once | INTRAVENOUS | Status: AC
  Administered 2021-12-31: 200 mg via INTRAVENOUS
  Filled 2021-12-31: qty 40

## 2021-12-31 MED ORDER — METHYLPREDNISOLONE SODIUM SUCC 125 MG IJ SOLR
1.0000 mg/kg | Freq: Two times a day (BID) | INTRAMUSCULAR | Status: AC
  Administered 2021-12-31 – 2022-01-02 (×6): 56.25 mg via INTRAVENOUS
  Filled 2021-12-31 (×6): qty 2

## 2021-12-31 MED ORDER — IPRATROPIUM-ALBUTEROL 0.5-2.5 (3) MG/3ML IN SOLN
3.0000 mL | Freq: Four times a day (QID) | RESPIRATORY_TRACT | Status: DC
  Administered 2021-12-31 – 2022-01-01 (×4): 3 mL via RESPIRATORY_TRACT
  Filled 2021-12-31 (×4): qty 3

## 2021-12-31 MED ORDER — ONDANSETRON HCL 4 MG/2ML IJ SOLN
4.0000 mg | Freq: Four times a day (QID) | INTRAMUSCULAR | Status: DC | PRN
  Filled 2021-12-31: qty 2

## 2021-12-31 MED ORDER — PANTOPRAZOLE SODIUM 40 MG IV SOLR
40.0000 mg | INTRAVENOUS | Status: DC
  Administered 2021-12-31: 40 mg via INTRAVENOUS
  Filled 2021-12-31: qty 10

## 2021-12-31 MED ORDER — VITAMIN C 500 MG PO TABS
500.0000 mg | ORAL_TABLET | Freq: Every day | ORAL | Status: DC
  Administered 2021-12-31 – 2022-01-02 (×3): 500 mg via ORAL
  Filled 2021-12-31 (×3): qty 1

## 2021-12-31 MED ORDER — PANTOPRAZOLE SODIUM 40 MG PO TBEC: 40.0000 mg | DELAYED_RELEASE_TABLET | Freq: Every day | ORAL | Status: DC | PRN

## 2021-12-31 MED ORDER — ROSUVASTATIN CALCIUM 20 MG PO TABS
10.0000 mg | ORAL_TABLET | Freq: Every day | ORAL | Status: DC
  Administered 2021-12-31 – 2022-01-01 (×2): 10 mg via ORAL
  Filled 2021-12-31: qty 2
  Filled 2021-12-31: qty 1

## 2021-12-31 MED ORDER — SODIUM PHOSPHATES 45 MMOLE/15ML IV SOLN
30.0000 mmol | Freq: Once | INTRAVENOUS | Status: AC
  Administered 2021-12-31: 30 mmol via INTRAVENOUS
  Filled 2021-12-31: qty 10

## 2021-12-31 MED ORDER — METOPROLOL SUCCINATE ER 25 MG PO TB24
25.0000 mg | ORAL_TABLET | Freq: Every day | ORAL | Status: DC
  Administered 2021-12-31: 25 mg via ORAL
  Filled 2021-12-31 (×2): qty 1

## 2021-12-31 MED ORDER — DEXTROSE-NACL 5-0.9 % IV SOLN: INTRAVENOUS | Status: DC

## 2021-12-31 MED ORDER — ASPIRIN 81 MG PO TBEC
81.0000 mg | DELAYED_RELEASE_TABLET | Freq: Every day | ORAL | Status: DC
  Administered 2021-12-31: 81 mg via ORAL
  Filled 2021-12-31 (×2): qty 1

## 2021-12-31 MED ORDER — ZINC SULFATE 220 (50 ZN) MG PO CAPS
220.0000 mg | ORAL_CAPSULE | Freq: Every day | ORAL | Status: DC
  Administered 2021-12-31 – 2022-01-02 (×3): 220 mg via ORAL
  Filled 2021-12-31 (×4): qty 1

## 2021-12-31 MED ORDER — SODIUM CHLORIDE 0.9 % IV SOLN
100.0000 mg | Freq: Every day | INTRAVENOUS | Status: DC
  Filled 2021-12-31: qty 20

## 2021-12-31 NOTE — Progress Notes (Signed)
Lower extremity venous bilateral study completed.   Please see CV Proc for preliminary results.   Marlaina Coburn, RDMS, RVT  

## 2021-12-31 NOTE — Assessment & Plan Note (Addendum)
As per Dr. Golden Pop instructions above: treating as severe COVID with 3L O2 requirement, CT findings, CRP 23.5. 1. COVID pathway 2. Daily labs 3. D.dimer pending for this AM 1. LE [place] for DVT 2. CT chest is neg for PE findings 4. Remdesivir 5. Steroids 6. Actemra

## 2021-12-31 NOTE — Progress Notes (Signed)
Jeff Willis [id number]:[id number] DOB: Dec 25, 1940 DOA: 12/30/2021 PCP: Jeff Arabian, MD   Subj: 81 y.o. AM PMHx IPF with UIP, HLD, .   Pt recently on cruise just last week.  Unfortunately diagnosed with COVID while on cruise, did require intermittent oxygen.   Over last couple of days symptoms have gradually worsened, and O2 sat was 75% when he got off of cruise ship today.  Paramedics found him at 83% on room air with a heart rate in the 150s and a temperature of 100.4.  He was given Tylenol prehospital.   Pt follows with Jeff Willis for his IPF, he is on Esbriet chronically for this.   No N/V.  Has occasional diarrhea.   Obj: 9/4A/O x4, positive SOB, positive CP secondary to coughing and SOB.   Objective: VITAL SIGNS: Temp: 98 F (36.7 C) (09/04 0500) Temp Source: Oral (09/04 0500) BP: 102/73 (09/04 0500) Pulse Rate: 87 (09/04 0500) SPO2; 97% FIO2: 3 L O2 via Delta   Intake/Output Summary (Last 24 hours) at 12/31/2021 0754 Last data filed at 12/31/2021 0106 Gross per 24 hour  Intake 360.45 ml  Output --  Net 360.45 ml     Exam: General: A/O x4, positive acute respiratory distress Lungs: tachypneic, decreased breath sounds bilaterally positive mild expiratory wheezes, negative crackles Cardiovascular: Tachycardic without murmur gallop or rub normal S1 and S2 Abdomen: Nontender, nondistended, soft, bowel sounds positive, no rebound, no ascites, no appreciable mass Extremities: No significant cyanosis, clubbing, or edema bilateral lower extremities Skin: Negative rashes, lesions, ulcers Psychiatric:  Negative depression, negative anxiety, negative fatigue, negative mania  Central nervous system:  Cranial nerves II through XII intact, tongue/uvula midline, all extremities muscle strength 5/5, sensation intact throughout, negative dysarthria, negative expressive aphasia, negative receptive aphasia.  .  Mobility Assessment (last 72 hours)     Mobility Assessment   No  documentation.             DVT prophylaxis: Lovenox Code Status: Full Family Communication: 9/4 son at bedside for discussion plan of care all questions answered Status is: Inpatient    Dispo: The patient is from: Home              Anticipated d/c is to: Home              Anticipated d/c date is: > 3 days              Patient currently is not medically stable to d/c.    Procedures/Significant Events:    Consultants:  PCCM Jeff Willis   Cultures 9/3 negative influenza A/B 9/3 positive SARS coronavirus 9/3 positive rhinovirus  9/4 QuantiFERON gold pending 9/3 blood RIGHT forearm NGTD 9/3 blood LEFT forearm NGTD  Antimicrobials: Anti-infectives (From admission, onward)    Start     Ordered Stop   01/01/22 1000  remdesivir 100 mg in sodium chloride 0.9 % 100 mL IVPB       See Hyperspace for full Linked Orders Report.   12/31/21 0317 01/03/22 0959   12/31/21 0500  remdesivir 200 mg in sodium chloride 0.9% 250 mL IVPB       See Hyperspace for full Linked Orders Report.   12/31/21 0317 12/31/21 0513   12/30/21 2215  cefTRIAXone (ROCEPHIN) 2 g in sodium chloride 0.9 % 100 mL IVPB        12/30/21 2202 01/04/22 2214   12/30/21 2215  azithromycin (ZITHROMAX) 500 mg in sodium chloride 0.9 % 250 mL IVPB  12/30/21 2202 01/04/22 2214        A/P Acute respiratory failure with hypoxia/CAP  -New 3L O2 requirement.  COVID+ during cruise last week. DDx today for cause of increased / new O2 requirement = COVID-19 PNA vs superimposed bacterial PNA vs AEIPF. -Gentle hydration maintain euvolemic - D5-0.9% saline 58m/hr - Strict in and out - Daily weight   CT chest with primarily L lung findings as above.  No PE. Got rocephin + azithromycin in ED Treat as severe COVID PNA for now Remdesivir + Steroids + Actemra Get urine legionella and s.pneumo, if neg then may DC ABx He is going to have a chat with thoracic radiologist to see what they make of CT scan  findings regarding possible AEIPF if this is playing any role in todays presentation.   COVID Pneumonia/ positive Rhinovirus COVID-19 Labs  Recent Labs    12/30/21 2235 12/31/21 0503 12/31/21 0945  DDIMER  --  1.75* 1.64*  FERRITIN 143  --  127  LDH 275*  --  281*  CRP 23.5*  --  23.9*    Lab Results  Component Value Date   SARSCOV2NAA POSITIVE (A) 12/30/2021   [place]GATIVE 09/19/2020   SGlen AllenNot Detected 04/16/2019  -Daily inflammatory markers -Remdesivir per pharmacy protocol - Actemra per pharmacy protocol - Solu-Medrol----> prednisone 50 mg daily -DuoNeb - Mucinex DM -Zinc 220 mg daily -Vitamin C 500 mg daily -Flutter valve - Incentive spirometry - Respiratory virus panel pending      Interstitial lung disease ([name] with UIP. Sees Ramaswamy for pulm chronically in clinic. Not entirely clear if AEIPF today, see discussion in acute resp failure above. On Steroids for COVID-19 anyhow. Per Dr. RChase Willis hold esbriet for a few days, they will guide on restarting.   Hyponatremia -See hypophosphatemia  Hypophosphatemia -Phosphorus goal> 2.5 - Sodium phosphate 30 mmol   Gentle hydration with NS   History of sinus tachycardia Continue low dose daily metoprolol         Care during the described time interval was provided by me .  I have reviewed this patient's available data, including medical history, events of note, physical examination, and all test results as part of my evaluation.

## 2021-12-31 NOTE — Assessment & Plan Note (Addendum)
IPF with UIP. Sees Ramaswamy for pulm chronically in clinic. Not entirely clear if AEIPF today, see discussion in acute resp failure above. On Steroids for COVID-19 anyhow. Per Dr. Chase Caller: hold esbriet for a few days, they will guide on restarting.

## 2021-12-31 NOTE — Consult Note (Addendum)
NAME:  Jeff Willis, MRN:  [id number], DOB:  11-14-1940, LOS: 0 ADMISSION DATE:  12/30/2021, CONSULTATION DATE:  12/31/21 REFERRING MD:  Dr Jennette Kettle of [facility], CHIEF COMPLAINT:  IPF patient with acute covid-19    History of Present Illness:   Jeff Willis -has known to have esophageal stenosis status post dilatation April 2023 by Dr. Saralyn Pilar on.  IPF on esbreit (FVC 84% and DLCO 45% - may 2023) on esbreit. On room air. Noral RHC 11/22/21.  Follows with Dr Chase Caller. HRCT 821'@3'$  with worsening.  Flew to [place] for a cruise.  On 12/24/2021 developed fever and cough.  On 12/26/2021 and the cruciate was diagnosed with COVID 19 acute infection.  Was offered antiviral but he refused because of negative experience previously with side effects.  He continued to have fever cough and failure to thrive, low appetite and being bedbound.  He returned to [place] via flight on 12/30/2021 and at home was continuing to cough.  Pulse ox check at home showed hypoxemia and therefore was brought to the emergency department via EMS.  Here he is resting on 3 L nasal cannula with stable work of breathing.  COVID PCR is positive.  CT chest with contrast [none PE protocol but no big PE noted.  He has left greater than right diffuse groundglass opacities.  His lactic acid 1.9 Procalcitonin 0.99 White count is normal CRP is 23.5 Ferritin is 143 LDH 275 and elevated  Doppler lower extremities pending.     Past Medical History:  IPF on esbriet     Latest Ref Rng & Units 09/07/2021   10:48 AM 02/05/2021   10:44 AM 09/22/2020    9:50 AM 01/17/2020   10:13 AM 10/06/2019    1:49 PM  ILD indicators  FVC-Pre L 2.63  2.67  2.81   2.59   FVC-Predicted Pre % 84  85  89  83  78   DLCO uncorrected ml/min/mmHg 12.24  13.45  16.91  13.79  15.70   DLCO UNC %Pred % 45  49  62  48  55   DLCO Corrected ml/min/mmHg 12.24  13.45  16.91  13.79  15.70   DLCO COR %Pred % 45  49  62  48  55        has a past  medical history of Back pain, Elevated PSA, Essential tremor, Hypercholesteremia, Neuropathy, Prostate cancer ([place]), and Sinus tachycardia (01/30/2018).   reports that he has never smoked. He has never used smokeless tobacco.  Past Surgical History:  Procedure Laterality Date   COLONOSCOPY WITH PROPOFOL N/A 08/10/2021   Procedure: COLONOSCOPY WITH PROPOFOL;  Surgeon: Carol Ada, MD;  Location: [facility];  Service: Gastroenterology;  Laterality: N/A;   ESOPHAGEAL DILATION  08/10/2021   Procedure: ESOPHAGEAL DILATION;  Surgeon: Carol Ada, MD;  Location: [facility];  Service: Gastroenterology;;   ESOPHAGOGASTRODUODENOSCOPY (EGD) WITH PROPOFOL N/A 08/10/2021   Procedure: ESOPHAGOGASTRODUODENOSCOPY (EGD) WITH PROPOFOL;  Surgeon: Carol Ada, MD;  Location: [facility];  Service: Gastroenterology;  Laterality: N/A;   HEMOSTASIS CLIP PLACEMENT  08/10/2021   Procedure: HEMOSTASIS CLIP PLACEMENT;  Surgeon: Carol Ada, MD;  Location: [facility];  Service: Gastroenterology;;   LUMBAR MICRODISCECTOMY     L4-5   POLYPECTOMY  08/10/2021   Procedure: POLYPECTOMY;  Surgeon: Carol Ada, MD;  Location: [facility];  Service: Gastroenterology;;   RADIOACTIVE SEED IMPLANT N/A 02/24/2018   Procedure: RADIOACTIVE SEED IMPLANT/BRACHYTHERAPY IMPLANT;  Surgeon: Festus Aloe, MD;  Location: [facility];  Service: Urology;  Laterality: N/A;   RIGHT HEART CATH N/A 11/22/2021   Procedure: RIGHT HEART CATH;  Surgeon: Troy Sine, MD;  Location: [facility];  Service: Cardiovascular;  Laterality: N/A;   SPACE OAR INSTILLATION N/A 02/24/2018   Procedure: SPACE OAR INSTILLATION;  Surgeon: Festus Aloe, MD;  Location: [facility];  Service: Urology;  Laterality: N/A;    Allergies  Allergen Reactions   Ofev [Nintedanib] Other (See Comments)    Weight loss    Immunization History  Administered Date(s) Administered   Influenza, High Dose Seasonal PF  01/28/2019, 01/12/2021   Influenza-Unspecified 01/05/2020   PFIZER(Purple Top)SARS-COV-2 Vaccination 07/23/2019, 08/17/2019   Pneumococcal-Unspecified 02/28/2015   Zoster, Live 01/26/2021    Family History  Problem Relation Age of Onset   Other Mother        died at 77 of natural causes   Other Father        unsure of health   Cancer Neg Hx      Current Facility-Administered Medications:    acetaminophen (TYLENOL) tablet 650 mg, 650 mg, Oral, Q6H PRN, Alcario Drought, Jared M, DO   azithromycin (ZITHROMAX) 500 mg in sodium chloride 0.9 % 250 mL IVPB, 500 mg, Intravenous, Q24H, Noemi Chapel, MD, Stopped at 12/31/21 0106   cefTRIAXone (ROCEPHIN) 2 g in sodium chloride 0.9 % 100 mL IVPB, 2 g, Intravenous, Q24H, Noemi Chapel, MD, Stopped at 12/30/21 2334   enoxaparin (LOVENOX) injection 40 mg, 40 mg, Subcutaneous, Q24H, Alcario Drought, Jared M, DO   methylPREDNISolone sodium succinate (SOLU-MEDROL) 125 mg/2 mL injection 56.25 mg, 1 mg/kg, Intravenous, Q12H **FOLLOWED BY** [START ON 01/03/2022] predniSONE (DELTASONE) tablet 50 mg, 50 mg, Oral, Daily, Alcario Drought, Jared M, DO   ondansetron (ZOFRAN) tablet 4 mg, 4 mg, Oral, Q6H PRN **OR** ondansetron (ZOFRAN) injection 4 mg, 4 mg, Intravenous, Q6H PRN, Alcario Drought, Jared M, DO   remdesivir 200 mg in sodium chloride 0.9% 250 mL IVPB, 200 mg, Intravenous, Once **FOLLOWED BY** [START ON 01/01/2022] remdesivir 100 mg in sodium chloride 0.9 % 100 mL IVPB, 100 mg, Intravenous, Daily, Alcario Drought, Jared M, DO   tocilizumab (ACTEMRA) 8 mg/kg = 450 mg in sodium chloride 0.9 % 100 mL infusion, 8 mg/kg, Intravenous, Once, Alcario Drought, Jared M, DO  Current Outpatient Medications:    aspirin EC 81 MG tablet, Take 81 mg by mouth daily. Swallow whole., Disp: , Rfl:    Cyanocobalamin (VITAMIN B-12 PO), Take 1 tablet by mouth 3 (three) times a week., Disp: , Rfl:    metoprolol succinate (TOPROL XL) 25 MG 24 hr tablet, Take 1 tablet (25 mg total) by mouth daily., Disp: 90 tablet, Rfl: 3    pantoprazole (PROTONIX) 40 MG tablet, Take 40 mg by mouth daily as needed (heartburn)., Disp: , Rfl:    Pirfenidone (ESBRIET) 267 MG TABS, Take 2 tablets (534 mg total) by mouth in the morning, at noon, and at bedtime. **LOW DOSE AS MAINTENANCE** (Patient taking differently: Take 267 mg by mouth in the morning, at noon, and at bedtime. **LOW DOSE AS MAINTENANCE**), Disp: 540 tablet, Rfl: 1   rosuvastatin (CRESTOR) 10 MG tablet, Take 10 mg by mouth at bedtime., Disp: , Rfl:    UNABLE TO FIND, Take 1 Dose by mouth 2 (two) times daily. Med Name: Prime Remedy Care: Ayurvita Multiherb Paste, Disp: , Rfl:    UNABLE TO FIND, Take 1 Dose by mouth daily. Med Name: Customize Multiherb Multivitamin Solution, Disp: , Rfl:  Significant Hospital Events:  12/30/2021 - admit  Interim History / Subjective:   12/31/2021 - seen in bed 15 here at [facility]  Objective   Blood pressure (!) 140/97, pulse (!) 102, temperature 99.4 F (37.4 C), resp. rate (!) 32, height '5\' 6"'$  (1.676 m), weight 56.2 kg, SpO2 100 %.        Intake/Output Summary (Last 24 hours) at 12/31/2021 0326 Last data filed at 12/31/2021 0106 Gross per 24 hour  Intake 360.45 ml  Output --  Net 360.45 ml   Filed Weights   12/30/21 2207  Weight: 56.2 kg    Examination: General: Thin emaciated male.  Lying with blankets in the stretcher in bed 15 most to emergency department HENT: No neck nodes no elevated JVP Lungs: Crackles nor overt increase in work of breathing. Cardiovascular: Sinus tachycardia normal heart sounds no murmurs Abdomen: Soft nontender no organomegaly  extremities: No cyanosis no clubbing no edema Neuro: Alert and oriented x3 GU: Not examined  Resolved Hospital Problem list   X  Assessment & Plan:  ASSESSMENT / PLAN:     IPF moderate to mild at baseline on pirfenidone (normal RHC July 2023)  Acute hypoxemic respiratory failure secondary to left greater than right groundglass opacity secondary to  acute COVID-19 [aspiration in the differential diagnosis]  12/31/2021 -> 3 L nasal cannula and resting comfortably. AT high risk for converstion to IPF lare up  P:   Hold pirfenidone for the hospital at least for a few days and then restart Oxygen therapy for pulse ox goal greater than 94% [Racial pulse ox] Consider BiPAP for covid  if oxygen requirements >=  5-6 L nasal cannul and escalating Can consider [facility] auto-antibody reduction plasmapheresis protocol if significant decline and IPF flare Is proriment     Acute COVID-19-symptom onset 12/16/2021 while in the cruise.  Hypoxemia since 12/30/2021 -CRP greater than 20 CT with contrast without PE    P:   Remdesivir Steroids for COVID Tocilizumab x1 dose [no obvious contraindications , CRP > 20]  Empiric CAP coverage but dc based on urine strepleg Check Covid IgG 11/30/21 -> if negative consider convalescent plasma esp since MABs not in market (emerging literature CP with +ve benefit in immune suppressed + old lit MABs beneficial in hospitalized patients who did not mount antibody IgG response)  Check duplex LE Check other respiratory viral panel, urine strep, urine Legionella Check Doppler times one of the lower extremities rule out DVT Check urine strep, urine Legionella, procalcitonin Check QuantiFERON gold    Known esophageal dilatation April 2023 History of dysphagia for solids and spices    P:   N.p.o. but for sips with meds PPI Consider bedside swallow evaluation while in the hospital - based on course    A; mild to moderate hyponatremia - new Na 128 at admit. Likely due to dehdyration  Plan  - gentle fluids due to hydratuib   Best practice:  Diet: NPO Pain/Anxiety/Delirium protocol (if indicated): no VAP protocol (if indicated): no DVT prophylaxis: Lovenix GI prophylaxis: ppi Glucose control: x Mobility: bed rest Code Status: full Family Communication: [patient and son Vinaye Disposition:  home     Louisa   The patient Jeff Willis is critically ill with multiple organ systems failure and requires high complexity decision making for assessment and support, frequent evaluation and titration of therapies, application of advanced monitoring technologies and extensive interpretation of multiple databases.   Critical Care Time devoted to patient care  services described in this note is  35  Minutes. This time reflects time of care of this signee Dr Brand Males. This critical care time does not reflect procedure time, or teaching time or supervisory time of PA/NP/Med student/Med Resident etc but could involve care discussion time      SIGNATURE    Dr. Brand Males, M.D., F.C.C.P,  Pulmonary and Critical Care Medicine Staff Physician, [place] Director - Interstitial Lung Disease  Program  P[facility] [place], [place], [address]  NPI Number:  NPI #[id number]  Pager: [phone], If no answer  -> Check AMION or Try [phone] Telephone (clinical office): [phone] Telephone (research): [phone]  3:26 AM 12/31/2021   12/31/2021 3:26 AM    LABS    PULMONARY No results for input(s): "PHART", "PCO2ART", "PO2ART", "HCO3", "TCO2", "O2SAT" in the last 168 hours.  Invalid input(s): "PCO2", "PO2"  CBC Recent Labs  Lab 12/30/21 2235  HGB 12.6*  HCT 35.7*  WBC 10.5  PLT 224    COAGULATION Recent Labs  Lab 12/30/21 2235  INR 1.2    CARDIAC  No results for input(s): "TROPONINI" in the last 168 hours. No results for input(s): "PROBNP" in the last 168 hours.  CHEMISTRY Recent Labs  Lab 12/30/21 2235  NA 128*  K 3.6  CL 95*  CO2 23  GLUCOSE 180*  BUN 8  CREATININE 0.95  CALCIUM 8.5*   Estimated Creatinine Clearance: 49.3 mL/min (by C-G formula based on SCr of 0.95 mg/dL).   LIVER Recent Labs  Lab 12/30/21 2235  AST 37  ALT 28   ALKPHOS 57  BILITOT 0.6  PROT 5.8*  ALBUMIN 2.4*  INR 1.2     INFECTIOUS Recent Labs  Lab 12/30/21 2233 12/30/21 2235  LATICACIDVEN 1.9  --   PROCALCITON  --  0.99     ENDOCRINE CBG (last 3)  No results for input(s): "GLUCAP" in the last 72 hours.       IMAGING x48h  - image(s) personally visualized  -   highlighted in bold CT CHEST W CONTRAST  Result Date: 12/31/2021 CLINICAL DATA:  Pneumonia. EXAM: CT CHEST WITH CONTRAST TECHNIQUE: Multidetector CT imaging of the chest was performed during intravenous contrast administration. RADIATION DOSE REDUCTION: This exam was performed according to the departmental dose-optimization program which includes automated exposure control, adjustment of the mA and/or kV according to patient size and/or use of iterative reconstruction technique. CONTRAST:  31m OMNIPAQUE IOHEXOL 300 MG/ML  SOLN COMPARISON:  Chest radiograph dated 12/30/2021 and CT dated 12/18/2018. FINDINGS: Cardiovascular: Mild cardiomegaly. No pericardial effusion. Top-normal ascending aorta measure up to 4 cm in diameter. No dissection. There is mild atherosclerotic calcification of the thoracic aorta. No pulmonary artery embolus identified. Mediastinum/Nodes: Mildly enlarged bilateral hilar and mediastinal lymph nodes, reactive. The esophagus is grossly unremarkable. No mediastinal fluid collection. Lungs/Pleura: Background of interstitial lung disease. Diffuse ground-glass opacity throughout the left lung, new since the prior CT most consistent with pneumonia. No pleural effusion or pneumothorax. The central airways are patent. Upper Abdomen: Fatty liver. A 5 mm nonobstructing right renal pole calculus. Small left renal upper pole cyst. Musculoskeletal: Degenerative changes of the spine. No acute osseous pathology. IMPRESSION: 1. No CT evidence of pulmonary artery embolus. 2. Background of interstitial lung disease with interval development of diffuse left lung opacities  consistent with pneumonia. 3. Mildly enlarged bilateral hilar and mediastinal lymph nodes, reactive. 4.  Fatty liver. 5. Nonobstructing right renal pole calculus. 6. Aortic Atherosclerosis (ICD10-I70.0). Electronically Signed   By: Anner Crete M.D.   On: 12/31/2021 02:16   DG Chest Port 1 View  Result Date: 12/30/2021 CLINICAL DATA:  cough, sob, known IPF, ? covid EXAM: PORTABLE CHEST 1 VIEW COMPARISON:  CT chest 12/17/2021, chest x-ray 01/30/2018 FINDINGS: Left lung border not visualized due to overlying lung findings. The heart and mediastinal contours are otherwise unchanged. Atherosclerotic plaque. Interval development of left lung diffuse hazy airspace opacity with slight aeration of the left apex. Chronic coarsened markings with no overt pulmonary edema. No pleural effusion. No pneumothorax. No acute osseous abnormality. IMPRESSION: 1. Interval development of left lung diffuse hazy airspace opacity with slight aeration of the left apex. Underlying pulmonary fibrosis again noted. Recommend CT chest with intravenous contrast for further evaluation. 2.  Aortic Atherosclerosis (ICD10-I70.0). Electronically Signed   By: Iven Finn M.D.   On: 12/30/2021 22:32

## 2021-12-31 NOTE — Progress Notes (Signed)
Pt family called out, pt coughed up small amount of blood in emesis bag. Dr. Curly Rim to make aware, waiting further instructions. Louanne Skye 12/31/21 11:21 PM

## 2021-12-31 NOTE — H&P (Addendum)
History and Physical    Patient: Jeff Willis:[id number] DOB: 09/14/1940 DOA: 12/30/2021 DOS: the patient was seen and examined on 12/31/2021 PCP: Gaynelle Arabian, MD  Patient coming from: Home  Chief Complaint:  Chief Complaint  Patient presents with   Covid Positive   Hypoxic   Tachycardia   HPI: Raeqwon GERBER PENZA is a 81 y.o. male with medical history significant of IPF with UIP.  Pt recently on cruise just last week.  Unfortunately diagnosed with COVID while on cruise, did require intermittent oxygen.  Over last couple of days symptoms have gradually worsened, and O2 sat was 75% when he got off of cruise ship today.  Paramedics found him at 83% on room air with a heart rate in the 150s and a temperature of 100.4.  He was given Tylenol prehospital.  Pt follows with Dr. Chase Caller for his IPF, he is on Esbriet chronically for this.  No N/V.  Has occasional diarrhea.   Review of Systems: As mentioned in the history of present illness. All other systems reviewed and are negative. Past Medical History:  Diagnosis Date   Back pain    Elevated PSA    Essential tremor    Hypercholesteremia    Neuropathy    Prostate cancer ([place])    Sinus tachycardia 01/30/2018   Past Surgical History:  Procedure Laterality Date   COLONOSCOPY WITH PROPOFOL N/A 08/10/2021   Procedure: COLONOSCOPY WITH PROPOFOL;  Surgeon: Carol Ada, MD;  Location: [facility];  Service: Gastroenterology;  Laterality: N/A;   ESOPHAGEAL DILATION  08/10/2021   Procedure: ESOPHAGEAL DILATION;  Surgeon: Carol Ada, MD;  Location: [facility];  Service: Gastroenterology;;   ESOPHAGOGASTRODUODENOSCOPY (EGD) WITH PROPOFOL N/A 08/10/2021   Procedure: ESOPHAGOGASTRODUODENOSCOPY (EGD) WITH PROPOFOL;  Surgeon: Carol Ada, MD;  Location: [facility];  Service: Gastroenterology;  Laterality: N/A;   HEMOSTASIS CLIP PLACEMENT  08/10/2021   Procedure: HEMOSTASIS CLIP PLACEMENT;  Surgeon: Carol Ada, MD;   Location: [facility];  Service: Gastroenterology;;   LUMBAR MICRODISCECTOMY     L4-5   POLYPECTOMY  08/10/2021   Procedure: POLYPECTOMY;  Surgeon: Carol Ada, MD;  Location: [facility];  Service: Gastroenterology;;   RADIOACTIVE SEED IMPLANT N/A 02/24/2018   Procedure: RADIOACTIVE SEED IMPLANT/BRACHYTHERAPY IMPLANT;  Surgeon: Festus Aloe, MD;  Location: [facility];  Service: Urology;  Laterality: N/A;   RIGHT HEART CATH N/A 11/22/2021   Procedure: RIGHT HEART CATH;  Surgeon: Troy Sine, MD;  Location: [facility];  Service: Cardiovascular;  Laterality: N/A;   SPACE OAR INSTILLATION N/A 02/24/2018   Procedure: SPACE OAR INSTILLATION;  Surgeon: Festus Aloe, MD;  [facility];  Service: Urology;  Laterality: N/A;   Social History:  reports that he has never smoked. He has never used smokeless tobacco. He reports that he does not drink alcohol and does not use drugs.  Allergies  Allergen Reactions   Ofev [Nintedanib] Other (See Comments)    Weight loss    Family History  Problem Relation Age of Onset   Other Mother        died at 77 of natural causes   Other Father        unsure of health   Cancer Neg Hx     Prior to Admission medications   Medication Sig Start Date End Date Taking? Authorizing Provider  aspirin EC 81 MG tablet Take 81 mg by mouth daily. Swallow whole.    [provider]  Cyanocobalamin (VITAMIN B-12  PO) Take 1 tablet by mouth 3 (three) times a week.    [provider]  metoprolol succinate (TOPROL XL) 25 MG 24 hr tablet Take 1 tablet (25 mg total) by mouth daily. 02/05/21   Troy Sine, MD  pantoprazole (PROTONIX) 40 MG tablet Take 40 mg by mouth daily as needed (heartburn). 12/16/19   [provider]  Pirfenidone (ESBRIET) 267 MG TABS Take 2 tablets (534 mg total) by mouth in the morning, at noon, and at bedtime. **LOW DOSE AS MAINTENANCE** Patient taking differently:  Take 267 mg by mouth in the morning, at noon, and at bedtime. **LOW DOSE AS MAINTENANCE** 10/25/21   Brand Males, MD  rosuvastatin (CRESTOR) 10 MG tablet Take 10 mg by mouth at bedtime. 10/04/21   [provider]  UNABLE TO FIND Take 1 Dose by mouth 2 (two) times daily. Med Name: [facility]: [facility]    [provider]  UNABLE TO FIND Take 1 Dose by mouth daily. Med Name: Customize Multiherb Multivitamin Solution    [provider]    Physical Exam: Vitals:   12/31/21 0045 12/31/21 0100 12/31/21 0240 12/31/21 0245  BP: 117/78 122/80  (!) 140/97  Pulse: 94 88  (!) 102  Resp: (!) 24 (!) 24  (!) 32  Temp:   99.4 F (37.4 C)   SpO2: 100% 100%  100%  Weight:      Height:       Constitutional: NAD, calm, comfortable, Not in respiratory distress at time of my evaluation Eyes: PERRL, lids and conjunctivae normal ENMT: Mucous membranes are moist. Posterior pharynx clear of any exudate or lesions.Normal dentition.  Neck: normal, supple, no masses, no thyromegaly Respiratory: Wheezing and rales present, wet sounding cough present Cardiovascular: Mild tachycardia present Abdomen: no tenderness, no masses palpated. No hepatosplenomegaly. Bowel sounds positive.  Musculoskeletal: no clubbing / cyanosis. No joint deformity upper and lower extremities. Good ROM, no contractures. Normal muscle tone.  Skin: no rashes, lesions, ulcers. No induration Neurologic: CN 2-12 grossly intact. Sensation intact, DTR normal. Strength 5/5 in all 4.  Psychiatric: Normal judgment and insight. Alert and oriented x 3. Normal mood.   Data Reviewed:    COVID-19 positive Influenza Neg  CBC    Component Value Date/Time   WBC 10.5 12/30/2021 2235   RBC 3.88 (L) 12/30/2021 2235   HGB 12.6 (L) 12/30/2021 2235   HGB 13.7 11/14/2021 1610   HCT 35.7 (L) 12/30/2021 2235   HCT 39.3 11/14/2021 1610   PLT 224 12/30/2021 2235   PLT 171 11/14/2021 1610   MCV 92.0  12/30/2021 2235   MCV 93 11/14/2021 1610   MCH 32.5 12/30/2021 2235   MCHC 35.3 12/30/2021 2235   RDW 12.3 12/30/2021 2235   RDW 12.2 11/14/2021 1610   LYMPHSABS 0.6 (L) 12/30/2021 2235   MONOABS 0.7 12/30/2021 2235   EOSABS 0.0 12/30/2021 2235   BASOSABS 0.0 12/30/2021 2235   CRP 23.5 Procalcitonin 0.99  CMP     Component Value Date/Time   NA 128 (L) 12/30/2021 2235   NA 140 11/14/2021 1610   K 3.6 12/30/2021 2235   CL 95 (L) 12/30/2021 2235   CO2 23 12/30/2021 2235   GLUCOSE 180 (H) 12/30/2021 2235   BUN 8 12/30/2021 2235   BUN 9 11/14/2021 1610   CREATININE 0.95 12/30/2021 2235   CALCIUM 8.5 (L) 12/30/2021 2235   PROT 5.8 (L) 12/30/2021 2235   PROT 7.4 11/30/2020 0843   ALBUMIN  2.4 (L) 12/30/2021 2235   ALBUMIN 4.1 11/30/2020 0843   AST 37 12/30/2021 2235   ALT 28 12/30/2021 2235   ALKPHOS 57 12/30/2021 2235   BILITOT 0.6 12/30/2021 2235   BILITOT 0.5 11/30/2020 0843   GFRNONAA >60 12/30/2021 2235   GFRAA >60 02/17/2018 1021   CXR: IMPRESSION: 1. Interval development of left lung diffuse hazy airspace opacity with slight aeration of the left apex. Underlying pulmonary fibrosis again noted. Recommend CT chest with intravenous contrast for further evaluation. 2.  Aortic Atherosclerosis (ICD10-I70.0).  IMPRESSION: 1. No CT evidence of pulmonary artery embolus. 2. Background of interstitial lung disease with interval development of diffuse left lung opacities consistent with pneumonia. 3. Mildly enlarged bilateral hilar and mediastinal lymph nodes, reactive. 4. Fatty liver. 5. Nonobstructing right renal pole calculus. 6. Aortic Atherosclerosis (ICD10-I70.0).  Assessment and Plan: * Acute respiratory failure with hypoxia (HCC) New 3L O2 requirement.  COVID+ during cruise last week. DDx today for cause of increased / new O2 requirement = COVID-19 PNA vs superimposed bacterial PNA vs AEIPF. Procalcitonin 0.99, WBC 10.5k CRP 23.5 CT chest with primarily L lung  findings as above.  No PE. Got rocephin + azithromycin in ED Discussed treatment plan with his pulmonologist Dr. Chase Caller who conveniently happens to be on call for PCCM tonight: Treat as severe COVID PNA for now Remdesivir + Steroids + Actemra Get urine legionella and s.pneumo, if neg then may DC ABx He is going to have a chat with thoracic radiologist to see what they make of CT scan findings regarding possible AEIPF if this is playing any role in todays presentation.  COVID-19 virus infection As per Dr. Golden Pop instructions above: treating as severe COVID with 3L O2 requirement, CT findings, CRP 23.5. COVID pathway Daily labs D.dimer pending for this AM LE [place] for DVT CT chest is neg for PE findings Remdesivir Steroids Actemra  Interstitial lung disease ([place]) IPF with UIP. Sees Ramaswamy for pulm chronically in clinic. Not entirely clear if AEIPF today, see discussion in acute resp failure above. On Steroids for COVID-19 anyhow. Per Dr. Chase Caller: hold esbriet for a few days, they will guide on restarting.  Hyponatremia Gentle hydration with NS  History of sinus tachycardia Continue low dose daily metoprolol      Advance Care Planning:   Code Status: Full Code  Consults: Dr. Chase Caller as above  Family Communication: Family at bedside  Severity of Illness: The appropriate patient status for this patient is INPATIENT. Inpatient status is judged to be reasonable and necessary in order to provide the required intensity of service to ensure the patient's safety. The patient's presenting symptoms, physical exam findings, and initial radiographic and laboratory data in the context of their chronic comorbidities is felt to place them at high risk for further clinical deterioration. Furthermore, it is not anticipated that the patient will be medically stable for discharge from the hospital within 2 midnights of admission.   * I certify that at the point of admission it is my  clinical judgment that the patient will require inpatient hospital care spanning beyond 2 midnights from the point of admission due to high intensity of service, high risk for further deterioration and high frequency of surveillance required.*  Author: Etta Quill., DO 12/31/2021 3:34 AM  For on call review [email or web address].

## 2021-12-31 NOTE — Assessment & Plan Note (Signed)
Continue low dose daily metoprolol

## 2021-12-31 NOTE — Assessment & Plan Note (Signed)
New 3L O2 requirement.  COVID+ during cruise last week. DDx today for cause of increased / new O2 requirement = COVID-19 PNA vs superimposed bacterial PNA vs AEIPF. Procalcitonin 0.99, WBC 10.5k CRP 23.5 CT chest with primarily L lung findings as above.  No PE. 1. Got rocephin + azithromycin in ED 2. Discussed treatment plan with his pulmonologist Dr. Chase Caller who conveniently happens to be on call for PCCM tonight: 1. Treat as severe COVID PNA for now 2. Remdesivir + Steroids + Actemra 3. Get urine legionella and s.pneumo, if neg then may DC ABx 4. He is going to have a chat with thoracic radiologist to see what they make of CT scan findings regarding possible AEIPF if this is playing any role in todays presentation.

## 2021-12-31 NOTE — Assessment & Plan Note (Signed)
Gentle hydration with NS

## 2022-01-01 ENCOUNTER — Inpatient Hospital Stay: Payer: Self-pay

## 2022-01-01 ENCOUNTER — Inpatient Hospital Stay (HOSPITAL_COMMUNITY): Payer: Medicare Other

## 2022-01-01 DIAGNOSIS — E43 Unspecified severe protein-calorie malnutrition: Secondary | ICD-10-CM | POA: Insufficient documentation

## 2022-01-01 DIAGNOSIS — J9601 Acute respiratory failure with hypoxia: Secondary | ICD-10-CM | POA: Diagnosis not present

## 2022-01-01 LAB — URINE CULTURE: Culture: NO GROWTH

## 2022-01-01 LAB — COMPREHENSIVE METABOLIC PANEL
ALT: 42 U/L (ref 0–44)
AST: 46 U/L — ABNORMAL HIGH (ref 15–41)
Albumin: 2.1 g/dL — ABNORMAL LOW (ref 3.5–5.0)
Alkaline Phosphatase: 90 U/L (ref 38–126)
Anion gap: 9 (ref 5–15)
BUN: 12 mg/dL (ref 8–23)
CO2: 28 mmol/L (ref 22–32)
Calcium: 8 mg/dL — ABNORMAL LOW (ref 8.9–10.3)
Chloride: 99 mmol/L (ref 98–111)
Creatinine, Ser: 0.93 mg/dL (ref 0.61–1.24)
GFR, Estimated: >60 mL/min (ref >60)
Glucose, Bld: 243 mg/dL — ABNORMAL HIGH (ref 70–99)
Potassium: 2.8 mmol/L — ABNORMAL LOW (ref 3.5–5.1)
Sodium: 136 mmol/L (ref 135–145)
Total Bilirubin: 0.6 mg/dL (ref 0.3–1.2)
Total Protein: 5.8 g/dL — ABNORMAL LOW (ref 6.5–8.1)

## 2022-01-01 LAB — BASIC METABOLIC PANEL
Anion gap: 5 (ref 5–15)
BUN: 16 mg/dL (ref 8–23)
CO2: 28 mmol/L (ref 22–32)
Calcium: 7.9 mg/dL — ABNORMAL LOW (ref 8.9–10.3)
Chloride: 103 mmol/L (ref 98–111)
Creatinine, Ser: 1.02 mg/dL (ref 0.61–1.24)
GFR, Estimated: >60 mL/min (ref >60)
Glucose, Bld: 174 mg/dL — ABNORMAL HIGH (ref 70–99)
Potassium: 4.7 mmol/L (ref 3.5–5.1)
Sodium: 136 mmol/L (ref 135–145)

## 2022-01-01 LAB — HEMOGLOBIN A1C
Hgb A1c MFr Bld: 5.8 % — ABNORMAL HIGH (ref 4.8–5.6)
Mean Plasma Glucose: 119.76 mg/dL

## 2022-01-01 LAB — D-DIMER, QUANTITATIVE: D-Dimer, Quant: 6.31 ug/mL-FEU — ABNORMAL HIGH (ref 0.00–0.50)

## 2022-01-01 LAB — POCT I-STAT 7, (LYTES, BLD GAS, ICA,H+H)
Acid-Base Excess: 3 mmol/L — ABNORMAL HIGH (ref 0.0–2.0)
Acid-Base Excess: 5 mmol/L — ABNORMAL HIGH (ref 0.0–2.0)
Acid-Base Excess: 3 mmol/L — ABNORMAL HIGH (ref 0.0–2.0)
Bicarbonate: 32.6 mmol/L — ABNORMAL HIGH (ref 20.0–28.0)
Bicarbonate: 33.9 mmol/L — ABNORMAL HIGH (ref 20.0–28.0)
Bicarbonate: 29.5 mmol/L — ABNORMAL HIGH (ref 20.0–28.0)
Calcium, Ion: 1.18 mmol/L (ref 1.15–1.40)
Calcium, Ion: 1.18 mmol/L (ref 1.15–1.40)
Calcium, Ion: 1.1 mmol/L — ABNORMAL LOW (ref 1.15–1.40)
HCT: 40 % (ref 39.0–52.0)
HCT: 37 % — ABNORMAL LOW (ref 39.0–52.0)
HCT: 34 % — ABNORMAL LOW (ref 39.0–52.0)
Hemoglobin: 13.6 g/dL (ref 13.0–17.0)
Hemoglobin: 12.6 g/dL — ABNORMAL LOW (ref 13.0–17.0)
Hemoglobin: 11.6 g/dL — ABNORMAL LOW (ref 13.0–17.0)
O2 Saturation: 100 %
O2 Saturation: 100 %
O2 Saturation: 98 %
Patient temperature: 96.7
Patient temperature: 36.4
Patient temperature: 36.4
Potassium: 2.9 mmol/L — ABNORMAL LOW (ref 3.5–5.1)
Potassium: 3 mmol/L — ABNORMAL LOW (ref 3.5–5.1)
Potassium: 3.8 mmol/L (ref 3.5–5.1)
Sodium: 139 mmol/L (ref 135–145)
Sodium: 136 mmol/L (ref 135–145)
Sodium: 138 mmol/L (ref 135–145)
TCO2: 35 mmol/L — ABNORMAL HIGH (ref 22–32)
TCO2: 36 mmol/L — ABNORMAL HIGH (ref 22–32)
TCO2: 31 mmol/L (ref 22–32)
pCO2 arterial: 72.5 mmHg (ref 32–48)
pCO2 arterial: 72.1 mmHg (ref 32–48)
pCO2 arterial: 50.2 mmHg — ABNORMAL HIGH (ref 32–48)
pH, Arterial: 7.256 — ABNORMAL LOW (ref 7.35–7.45)
pH, Arterial: 7.277 — ABNORMAL LOW (ref 7.35–7.45)
pH, Arterial: 7.374 (ref 7.35–7.45)
pO2, Arterial: 361 mmHg — ABNORMAL HIGH (ref 83–108)
pO2, Arterial: 272 mmHg — ABNORMAL HIGH (ref 83–108)
pO2, Arterial: 103 mmHg (ref 83–108)

## 2022-01-01 LAB — CBC WITH DIFFERENTIAL/PLATELET
Abs Immature Granulocytes: 0.26 10*3/uL — ABNORMAL HIGH (ref 0.00–0.07)
Basophils Absolute: 0.1 10*3/uL (ref 0.0–0.1)
Basophils Relative: 0 %
Eosinophils Absolute: 0 10*3/uL (ref 0.0–0.5)
Eosinophils Relative: 0 %
HCT: 38.3 % — ABNORMAL LOW (ref 39.0–52.0)
Hemoglobin: 13.6 g/dL (ref 13.0–17.0)
Immature Granulocytes: 1 %
Lymphocytes Relative: 3 %
Lymphs Abs: 0.6 10*3/uL — ABNORMAL LOW (ref 0.7–4.0)
MCH: 32.4 pg (ref 26.0–34.0)
MCHC: 35.5 g/dL (ref 30.0–36.0)
MCV: 91.2 fL (ref 80.0–100.0)
Monocytes Absolute: 0.7 10*3/uL (ref 0.1–1.0)
Monocytes Relative: 3 %
Neutro Abs: 19.7 10*3/uL — ABNORMAL HIGH (ref 1.7–7.7)
Neutrophils Relative %: 93 %
Platelets: 269 10*3/uL (ref 150–400)
RBC: 4.2 MIL/uL — ABNORMAL LOW (ref 4.22–5.81)
RDW: 12.7 % (ref 11.5–15.5)
WBC: 21.3 10*3/uL — ABNORMAL HIGH (ref 4.0–10.5)
nRBC: 0 % (ref 0.0–0.2)

## 2022-01-01 LAB — TROPONIN I (HIGH SENSITIVITY)
Troponin I (High Sensitivity): 307 ng/L (ref <18)
Troponin I (High Sensitivity): 245 ng/L (ref <18)

## 2022-01-01 LAB — FERRITIN: Ferritin: 150 ng/mL (ref 24–336)

## 2022-01-01 LAB — LACTATE DEHYDROGENASE: LDH: 371 U/L — ABNORMAL HIGH (ref 98–192)

## 2022-01-01 LAB — LACTIC ACID, PLASMA
Lactic Acid, Venous: 1.9 mmol/L (ref 0.5–1.9)
Lactic Acid, Venous: 1.8 mmol/L (ref 0.5–1.9)

## 2022-01-01 LAB — POTASSIUM: Potassium: 4.8 mmol/L (ref 3.5–5.1)

## 2022-01-01 LAB — GLUCOSE, CAPILLARY
Glucose-Capillary: 251 mg/dL — ABNORMAL HIGH (ref 70–99)
Glucose-Capillary: 183 mg/dL — ABNORMAL HIGH (ref 70–99)
Glucose-Capillary: 157 mg/dL — ABNORMAL HIGH (ref 70–99)
Glucose-Capillary: 138 mg/dL — ABNORMAL HIGH (ref 70–99)
Glucose-Capillary: 218 mg/dL — ABNORMAL HIGH (ref 70–99)
Glucose-Capillary: 218 mg/dL — ABNORMAL HIGH (ref 70–99)

## 2022-01-01 LAB — PHOSPHORUS
Phosphorus: 4.7 mg/dL — ABNORMAL HIGH (ref 2.5–4.6)
Phosphorus: 2.4 mg/dL — ABNORMAL LOW (ref 2.5–4.6)

## 2022-01-01 LAB — MAGNESIUM
Magnesium: 1.8 mg/dL (ref 1.7–2.4)
Magnesium: 2.4 mg/dL (ref 1.7–2.4)

## 2022-01-01 LAB — C-REACTIVE PROTEIN: CRP: 20 mg/dL — ABNORMAL HIGH (ref <1.0)

## 2022-01-01 LAB — SEDIMENTATION RATE: Sed Rate: 72 mm/hr — ABNORMAL HIGH (ref 0–16)

## 2022-01-01 LAB — SAR COV2 SEROLOGY (COVID19)AB(IGG),IA: SARS-CoV-2 Ab, IgG: REACTIVE — AB

## 2022-01-01 MED ORDER — FUROSEMIDE 10 MG/ML IJ SOLN
INTRAMUSCULAR | Status: AC
  Administered 2022-01-01: 40 mg
  Filled 2022-01-01: qty 4

## 2022-01-01 MED ORDER — FUROSEMIDE 10 MG/ML IJ SOLN
INTRAMUSCULAR | Status: AC
  Administered 2022-01-01: 40 mg via INTRAVENOUS
  Filled 2022-01-01: qty 4

## 2022-01-01 MED ORDER — MAGNESIUM SULFATE 2 GM/50ML IV SOLN
2.0000 g | Freq: Once | INTRAVENOUS | Status: AC
  Administered 2022-01-01: 2 g via INTRAVENOUS
  Filled 2022-01-01: qty 50

## 2022-01-01 MED ORDER — MORPHINE SULFATE (PF) 4 MG/ML IV SOLN: 4.0000 mg | Freq: Once | INTRAVENOUS | Status: DC

## 2022-01-01 MED ORDER — POTASSIUM CHLORIDE 20 MEQ PO PACK
40.0000 meq | PACK | Freq: Once | ORAL | Status: AC
  Administered 2022-01-01: 40 meq
  Filled 2022-01-01: qty 2

## 2022-01-01 MED ORDER — ROCURONIUM BROMIDE 10 MG/ML (PF) SYRINGE
PREFILLED_SYRINGE | INTRAVENOUS | Status: AC
  Administered 2022-01-01: 60 mg via INTRAVENOUS
  Filled 2022-01-01: qty 10

## 2022-01-01 MED ORDER — MORPHINE SULFATE (PF) 2 MG/ML IV SOLN
INTRAVENOUS | Status: AC
  Administered 2022-01-01: 4 mg via INTRAVENOUS
  Filled 2022-01-01: qty 2

## 2022-01-01 MED ORDER — MORPHINE SULFATE (PF) 2 MG/ML IV SOLN: 4.0000 mg | Freq: Once | INTRAVENOUS | Status: AC

## 2022-01-01 MED ORDER — SODIUM CHLORIDE 0.9% FLUSH: 10.0000 mL | INTRAVENOUS | Status: DC | PRN

## 2022-01-01 MED ORDER — NOREPINEPHRINE 4 MG/250ML-% IV SOLN
0.0000 ug/min | INTRAVENOUS | Status: DC
  Administered 2022-01-01: 2 ug/min via INTRAVENOUS
  Filled 2022-01-01: qty 250

## 2022-01-01 MED ORDER — INSULIN ASPART 100 UNIT/ML IJ SOLN
2.0000 [IU] | INTRAMUSCULAR | Status: DC
  Administered 2022-01-01 – 2022-01-03 (×11): 2 [IU] via SUBCUTANEOUS

## 2022-01-01 MED ORDER — ENOXAPARIN SODIUM 40 MG/0.4ML IJ SOSY
40.0000 mg | PREFILLED_SYRINGE | INTRAMUSCULAR | Status: DC
  Administered 2022-01-01 – 2022-01-18 (×18): 40 mg via SUBCUTANEOUS
  Filled 2022-01-01 (×18): qty 0.4

## 2022-01-01 MED ORDER — VITAL HIGH PROTEIN PO LIQD: 1000.0000 mL | ORAL | Status: DC

## 2022-01-01 MED ORDER — PANTOPRAZOLE SODIUM 40 MG IV SOLR: 40.0000 mg | Freq: Two times a day (BID) | INTRAVENOUS | Status: DC

## 2022-01-01 MED ORDER — TOCILIZUMAB 400 MG/20ML IV SOLN
8.0000 mg/kg | Freq: Once | INTRAVENOUS | Status: DC
  Filled 2022-01-01: qty 22.5

## 2022-01-01 MED ORDER — FENTANYL CITRATE PF 50 MCG/ML IJ SOSY
PREFILLED_SYRINGE | INTRAMUSCULAR | Status: AC
  Administered 2022-01-01: 100 ug via INTRAVENOUS
  Filled 2022-01-01: qty 2

## 2022-01-01 MED ORDER — FENTANYL CITRATE PF 50 MCG/ML IJ SOSY: 25.0000 ug | PREFILLED_SYRINGE | INTRAMUSCULAR | Status: DC | PRN

## 2022-01-01 MED ORDER — MIDAZOLAM HCL 2 MG/2ML IJ SOLN
INTRAMUSCULAR | Status: AC
  Administered 2022-01-01: 2 mg via INTRAVENOUS
  Filled 2022-01-01: qty 2

## 2022-01-01 MED ORDER — ROCURONIUM BROMIDE 50 MG/5ML IV SOLN
60.0000 mg | Freq: Once | INTRAVENOUS | Status: AC
  Filled 2022-01-01: qty 6

## 2022-01-01 MED ORDER — POTASSIUM CHLORIDE 10 MEQ/100ML IV SOLN
10.0000 meq | INTRAVENOUS | Status: AC
  Administered 2022-01-01 (×6): 10 meq via INTRAVENOUS
  Filled 2022-01-01 (×2): qty 100

## 2022-01-01 MED ORDER — MIDAZOLAM BOLUS VIA INFUSION: 0.0000 mg | INTRAVENOUS | Status: DC | PRN

## 2022-01-01 MED ORDER — CHLORHEXIDINE GLUCONATE CLOTH 2 % EX PADS
6.0000 | MEDICATED_PAD | Freq: Every day | CUTANEOUS | Status: DC
Start: 2022-01-01 — End: 2022-01-08
  Administered 2022-01-01 – 2022-01-07 (×7): 6 via TOPICAL

## 2022-01-01 MED ORDER — FENTANYL CITRATE PF 50 MCG/ML IJ SOSY: 50.0000 ug | PREFILLED_SYRINGE | Freq: Once | INTRAMUSCULAR | Status: DC

## 2022-01-01 MED ORDER — SODIUM BICARBONATE 8.4 % IV SOLN
INTRAVENOUS | Status: AC
  Administered 2022-01-01: 50 meq via INTRAVENOUS
  Filled 2022-01-01: qty 50

## 2022-01-01 MED ORDER — INSULIN ASPART 100 UNIT/ML IJ SOLN
0.0000 [IU] | INTRAMUSCULAR | Status: DC
  Administered 2022-01-01: 8 [IU] via SUBCUTANEOUS
  Administered 2022-01-01: 3 [IU] via SUBCUTANEOUS

## 2022-01-01 MED ORDER — LACTATED RINGERS IV BOLUS
500.0000 mL | Freq: Once | INTRAVENOUS | Status: AC
Start: 2022-01-01 — End: 2022-01-02
  Administered 2022-01-01: 500 mL via INTRAVENOUS

## 2022-01-01 MED ORDER — FENTANYL CITRATE PF 50 MCG/ML IJ SOSY: 100.0000 ug | PREFILLED_SYRINGE | Freq: Once | INTRAMUSCULAR | Status: AC

## 2022-01-01 MED ORDER — VITAL 1.5 CAL PO LIQD
1000.0000 mL | ORAL | Status: DC
  Administered 2022-01-01 – 2022-01-04 (×4): 1000 mL

## 2022-01-01 MED ORDER — INSULIN ASPART 100 UNIT/ML IJ SOLN
3.0000 [IU] | INTRAMUSCULAR | Status: DC
  Administered 2022-01-01: 9 [IU] via SUBCUTANEOUS
  Administered 2022-01-01: 3 [IU] via SUBCUTANEOUS
  Administered 2022-01-01: 6 [IU] via SUBCUTANEOUS
  Administered 2022-01-01 – 2022-01-02 (×2): 9 [IU] via SUBCUTANEOUS
  Administered 2022-01-02 (×2): 6 [IU] via SUBCUTANEOUS
  Administered 2022-01-02 (×2): 9 [IU] via SUBCUTANEOUS
  Administered 2022-01-03: 6 [IU] via SUBCUTANEOUS
  Administered 2022-01-03 (×3): 9 [IU] via SUBCUTANEOUS
  Administered 2022-01-03: 3 [IU] via SUBCUTANEOUS
  Administered 2022-01-03 – 2022-01-04 (×2): 9 [IU] via SUBCUTANEOUS
  Administered 2022-01-04: 6 [IU] via SUBCUTANEOUS

## 2022-01-01 MED ORDER — POTASSIUM CHLORIDE 10 MEQ/50ML IV SOLN: 10.0000 meq | INTRAVENOUS | Status: DC

## 2022-01-01 MED ORDER — MIDAZOLAM HCL 2 MG/2ML IJ SOLN: 2.0000 mg | Freq: Once | INTRAMUSCULAR | Status: AC

## 2022-01-01 MED ORDER — TOCILIZUMAB 400 MG/20ML IV SOLN
8.0000 mg/kg | Freq: Once | INTRAVENOUS | Status: AC
  Administered 2022-01-01: 450 mg via INTRAVENOUS
  Filled 2022-01-01: qty 22.5

## 2022-01-01 MED ORDER — DEXTROSE 10 % IV SOLN: INTRAVENOUS | Status: DC

## 2022-01-01 MED ORDER — SODIUM CHLORIDE 0.9 % IV SOLN: INTRAVENOUS | Status: DC | PRN

## 2022-01-01 MED ORDER — DOCUSATE SODIUM 50 MG/5ML PO LIQD
100.0000 mg | Freq: Two times a day (BID) | ORAL | Status: DC
  Administered 2022-01-01 – 2022-01-04 (×7): 100 mg
  Filled 2022-01-01 (×7): qty 10

## 2022-01-01 MED ORDER — ETOMIDATE 2 MG/ML IV SOLN
INTRAVENOUS | Status: AC
  Administered 2022-01-01: 20 mg via INTRAVENOUS
  Filled 2022-01-01: qty 20

## 2022-01-01 MED ORDER — SODIUM BICARBONATE 8.4 % IV SOLN
50.0000 meq | INTRAVENOUS | Status: AC
Start: 2022-01-01 — End: 2022-01-01

## 2022-01-01 MED ORDER — ASPIRIN 81 MG PO CHEW
81.0000 mg | CHEWABLE_TABLET | Freq: Every day | ORAL | Status: DC
  Administered 2022-01-01 – 2022-01-02 (×2): 81 mg
  Filled 2022-01-01 (×2): qty 1

## 2022-01-01 MED ORDER — ORAL CARE MOUTH RINSE
15.0000 mL | OROMUCOSAL | Status: DC
  Administered 2022-01-01 – 2022-01-04 (×39): 15 mL via OROMUCOSAL

## 2022-01-01 MED ORDER — MIDAZOLAM HCL 2 MG/2ML IJ SOLN: 2.0000 mg | INTRAMUSCULAR | Status: DC | PRN

## 2022-01-01 MED ORDER — MIDAZOLAM-SODIUM CHLORIDE 100-0.9 MG/100ML-% IV SOLN
0.0000 mg/h | INTRAVENOUS | Status: DC
  Administered 2022-01-01: 2 mg/h via INTRAVENOUS
  Filled 2022-01-01: qty 100

## 2022-01-01 MED ORDER — POLYETHYLENE GLYCOL 3350 17 G PO PACK
17.0000 g | PACK | Freq: Every day | ORAL | Status: DC
  Administered 2022-01-02 – 2022-01-04 (×3): 17 g
  Filled 2022-01-01 (×3): qty 1

## 2022-01-01 MED ORDER — SODIUM CHLORIDE 0.9% FLUSH
10.0000 mL | Freq: Two times a day (BID) | INTRAVENOUS | Status: DC
  Administered 2022-01-01 – 2022-01-04 (×5): 10 mL
  Administered 2022-01-04: 40 mL
  Administered 2022-01-04: 20 mL
  Administered 2022-01-05 – 2022-01-11 (×13): 10 mL

## 2022-01-01 MED ORDER — FENTANYL 2500MCG IN NS 250ML (10MCG/ML) PREMIX INFUSION
0.0000 ug/h | INTRAVENOUS | Status: DC
  Administered 2022-01-01: 50 ug/h via INTRAVENOUS
  Administered 2022-01-02 – 2022-01-03 (×2): 100 ug/h via INTRAVENOUS
  Administered 2022-01-04: 200 ug/h via INTRAVENOUS
  Filled 2022-01-01 (×4): qty 250

## 2022-01-01 MED ORDER — ETOMIDATE 2 MG/ML IV SOLN: 20.0000 mg | Freq: Once | INTRAVENOUS | Status: AC

## 2022-01-01 MED ORDER — PHENYLEPHRINE 80 MCG/ML (10ML) SYRINGE FOR IV PUSH (FOR BLOOD PRESSURE SUPPORT)
PREFILLED_SYRINGE | INTRAVENOUS | Status: AC
  Filled 2022-01-01: qty 10

## 2022-01-01 MED ORDER — DEXTROSE 5 % IV SOLN
15.0000 mmol | Freq: Once | INTRAVENOUS | Status: AC
  Administered 2022-01-01: 15 mmol via INTRAVENOUS
  Filled 2022-01-01: qty 5

## 2022-01-01 MED ORDER — FENTANYL BOLUS VIA INFUSION
50.0000 ug | INTRAVENOUS | Status: DC | PRN
  Administered 2022-01-01 – 2022-01-03 (×2): 50 ug via INTRAVENOUS
  Administered 2022-01-03: 100 ug via INTRAVENOUS
  Administered 2022-01-03: 50 ug via INTRAVENOUS
  Administered 2022-01-03: 100 ug via INTRAVENOUS
  Administered 2022-01-03: 50 ug via INTRAVENOUS
  Administered 2022-01-03 – 2022-01-04 (×2): 100 ug via INTRAVENOUS

## 2022-01-01 MED ORDER — FREE WATER
100.0000 mL | Status: DC
  Administered 2022-01-01 – 2022-01-02 (×4): 100 mL

## 2022-01-01 MED ORDER — PROSOURCE TF20 ENFIT COMPATIBL EN LIQD: 60.0000 mL | Freq: Every day | ENTERAL | Status: DC

## 2022-01-01 MED ORDER — IPRATROPIUM-ALBUTEROL 0.5-2.5 (3) MG/3ML IN SOLN
3.0000 mL | Freq: Four times a day (QID) | RESPIRATORY_TRACT | Status: DC | PRN
Start: 2022-01-01 — End: 2022-01-18
  Administered 2022-01-04: 3 mL via RESPIRATORY_TRACT
  Filled 2022-01-01 (×2): qty 3

## 2022-01-01 MED ORDER — PANTOPRAZOLE SODIUM 40 MG IV SOLR
40.0000 mg | Freq: Two times a day (BID) | INTRAVENOUS | Status: DC
  Administered 2022-01-01 – 2022-01-04 (×7): 40 mg via INTRAVENOUS
  Filled 2022-01-01 (×7): qty 10

## 2022-01-01 MED ORDER — FUROSEMIDE 10 MG/ML IJ SOLN: 40.0000 mg | Freq: Once | INTRAMUSCULAR | Status: AC

## 2022-01-01 MED ORDER — ORAL CARE MOUTH RINSE: 15.0000 mL | OROMUCOSAL | Status: DC | PRN

## 2022-01-01 NOTE — Progress Notes (Signed)
Son Sivan Quast called back into IV team office requesting that we hold off on PICC at this time.  States that his brother and sister wish to speak with doctor before proceeding.  Spoke with Luiz Ochoa, RN regarding this request.  Louis to have MD speak with family and notify IV team once we are able to proceed with placement of PICC.

## 2022-01-01 NOTE — Progress Notes (Signed)
Pt became had a case of tachypnea and tachycardia, while oxygen dropped in 70s. Pt placed on non-rebreather, rapid response called and Dr. Curly Rim. Pt then progressed to BIPAP and moved to ICU, before leaving unit chest xray and EKG were done , 40 lasix IV given. BP (!) 161/117 (BP Location: Right Arm)   Pulse (!) 134   Temp (!) 96.7 F (35.9 C) (Axillary)   Resp (!) 41   Ht '5\' 6"'$  (1.040 m)   Wt 56.2 kg   SpO2 97%   BMI 20.01 kg/m   Report given to Travis 01/01/22 4:11 AM

## 2022-01-01 NOTE — Progress Notes (Signed)
Critical ABG results given to Dr. Vaughan Browner. Verbal order received to decrease VT to pts 6cc and increase RR to 32. Verbal order received to obtain a repeat ABG after changes made. Wean FiO2 down before weaning peep per MD. RT will continue to monitor and be available as needed.

## 2022-01-01 NOTE — Procedures (Signed)
Intubation Procedure Note  Jeff Willis  [id number]  [birth year]  Date:01/01/22  Time:5:18 AM   Provider Performing: Lestine Mount   Procedure: Intubation (87564)  Indication(s): Respiratory Failure  Consent: Unable to obtain consent due to emergent nature of procedure.  Anesthesia: Etomidate ('20mg'$ ), Versed ('2mg'$ ), Fentanyl (115mg), and Rocuronium ('60mg'$ )  Time Out: Verified patient identification, verified procedure, site/side was marked, verified correct patient position, special equipment/implants available, medications/allergies/relevant history reviewed, required imaging and test results available.  Sterile Technique: Usual hand hygiene, masks, and gloves were used  Procedure Description: Patient positioned in bed supine.  Sedation given as noted above.  Patient was intubated with endotracheal tube using Glidescope.  View was Grade 2 only posterior commissure .  Number of attempts was 1.  Colorimetric CO2 detector was consistent with tracheal placement.  Complications/Tolerance: None; patient tolerated the procedure well. Chest X-ray is ordered to verify placement.  EBL: Minimal  Specimen(s): None  SLestine Mount [facility] 01/01/22 5:19 AM  Please see [email or web address] for pager details.  From 7A-7P if no response, please call [phone] After hours, please call ELink [phone]

## 2022-01-01 NOTE — Progress Notes (Signed)
ABG results given to Dr. Vaughan Browner. No new orders received at this time. RT will continue to monitor and be available as needed.

## 2022-01-01 NOTE — Consult Note (Addendum)
NAME:  Jeff Willis, MRN:  [id number], DOB:  December 14, 1940, LOS: 1 ADMISSION DATE:  12/30/2021, CONSULTATION DATE:  12/31/21 REFERRING MD:  Dr Jennette Kettle of [facility], CHIEF COMPLAINT:  IPF patient with acute covid-19    BRIEF   Jeff Willis -has known to have esophageal stenosis status post dilatation April 2023 by Dr. Saralyn Pilar on.  IPF on esbreit (FVC 84% and DLCO 45% - may 2023) on esbreit. On room air. Noral RHC 11/22/21.  Follows with Dr Chase Caller. HRCT 821'@3'$  with worsening.  Flew to [place] for a cruise.  On 12/24/2021 developed fever and cough.  On 12/26/2021 and the cruciate was diagnosed with COVID 19 acute infection.  Was offered antiviral Paxlovid but he refused because of negative experience previously with side effects.  He continued to have fever cough and failure to thrive, low appetite and being bedbound.  He returned to [place] via flight on 12/30/2021 and at home was continuing to cough.  Pulse ox check at home showed hypoxemia and therefore was brought to the emergency department via EMS.  Here he is resting on 3 L nasal cannula with stable work of breathing.  COVID PCR is positive.  CT chest with contrast [none PE protocol but no big PE noted.  He has left greater than right diffuse groundglass opacities.  His lactic acid 1.9 Procalcitonin 0.99 White count is normal CRP is 23.5 Ferritin is 143 LDH 275 and elevated  Doppler lower extremities pending.     Past Medical History:  IPF on esbriet     Latest Ref Rng & Units 09/07/2021   10:48 AM 02/05/2021   10:44 AM 09/22/2020    9:50 AM 01/17/2020   10:13 AM 10/06/2019    1:49 PM  ILD indicators  FVC-Pre L 2.63  2.67  2.81   2.59   FVC-Predicted Pre % 84  85  89  83  78   DLCO uncorrected ml/min/mmHg 12.24  13.45  16.91  13.79  15.70   DLCO UNC %Pred % 45  49  62  48  55   DLCO Corrected ml/min/mmHg 12.24  13.45  16.91  13.79  15.70   DLCO COR %Pred % 45  49  62  48  55        has a past medical history  of Back pain, Elevated PSA, Essential tremor, Hypercholesteremia, Neuropathy, Prostate cancer ([place]), and Sinus tachycardia (01/30/2018).   reports that he has never smoked. He has never used smokeless tobacco.   has a past surgical history that includes Lumbar microdiscectomy; Radioactive seed implant (N/A, 02/24/2018); SPACE OAR INSTILLATION (N/A, 02/24/2018); Esophagogastroduodenoscopy (egd) with propofol (N/A, 08/10/2021); Colonoscopy with propofol (N/A, 08/10/2021); Esophageal dilation (08/10/2021); polypectomy (08/10/2021); Hemostasis clip placement (08/10/2021); and RIGHT HEART CATH (N/A, 11/22/2021).   Significant Hospital Events:  12/30/2021 - admit  Interim History / Subjective:   01/01/2022 -  Dual virus infection - rhinovirus and covid. Had some hemoptysis -> lovenox held -> then Suddnt worsening respiatory distress over 45 minutes -> no furthe hemoptysis - RR 30s, look of fear, higher o2 need 100% needing BiPAP . Also, A Fib RVR .  Son at bedside.  CXR diffuse GGO/ILD infiltrates  Objective   Blood pressure 111/74, pulse (!) 122, temperature (!) 97.4 F (36.3 C), temperature source Oral, resp. rate (!) 36, height '5\' 6"'$  (1.676 m), weight 56.2 kg, SpO2 95 %.    FiO2 (%):  [100 %] 100 %   Intake/Output Summary (Last 24 hours)  at 01/01/2022 0401 Last data filed at 12/31/2021 0926 Gross per 24 hour  Intake 240 ml  Output 525 ml  Net -285 ml   Filed Weights   12/30/21 2207  Weight: 56.2 kg    Examination: General Appearance:  Looks criticall ill.  Look of fear on BiPAP Head:  Normocephalic, without obvious abnormality, atraumatic Eyes:  PERRL - yes, conjunctiva/corneas - muddy     Ears:  Normal external ear canals, both ears Nose:  G tube - no Throat:  ETT TUBE - no , OG tube - no. BiPAP on Neck:  Supple,  No enlargement/tenderness/nodules Lungs:  RR 30s and later 40s, Not paradoixcal., Look of fear. Crakcls + Heart:  S1 and S2 normal, no murmur, CVP - no.  Pressors - no Abdomen:   Soft, no masses, no organomegaly Genitalia / Rectal:  Not done Extremities:  Extremities- intact Skin:  ntact in exposed areas . Sacral area - not examined Neurologic:  Sedation - none -> RASS - +1 . Moves all 4s - yes. CAM-ICU - neg . Orientation - x3+     Resolved Hospital Problem list   X  Assessment & Plan:      IPF moderate to mild at baseline on pirfenidone (normal RHC July 2023)  Acute hypoxemic respiratory failure secondary to left greater than right groundglass opacity secondary to acute COVID-19 [aspiration in the differential diagnosis] and Rhinorivurs - Present on Admit  Features c/w IPF flare up - Present on Admit   01/01/2022 -> worsening respiratory distress and failure - severe. Meets intubation criteria. Featreus c/w IPF flare up. Moved to ICU on BiPAP  P:   Intubate -> ARDS protocool Lasix    Acute COVID-19-symptom onset 12/24/2021 while in the cruise.   - Present on Admit Concurrent Rhinovirus 12/31/21 - Present on Admit  - Hypoxemia since 12/30/2021 -CRP greater than 20 - CT with contrast without PE and Duplex LE neg DVT  01/01/22 - CRP still high despite Tocilizumab x 1  P:   Remdesivir Steroids for COVID Tocilizumab x 2nd dose - HOLD Till there Is clarityifpost intubation OG tube bloody returns is GI bled v note   Empiric CAP coverage but dc based on urine strep and leg an dPCT  Await PCT Await urine leg Check QuantiFERON gold    Known esophageal dilatation April 2023 History of dysphagia for solids and spices  01/01/22 - some blood in OG post intubation  P:   N.p.o. but for sips with meds PPI IV BID OG tube    Mild to moderate hyponatremia - new Na 128 at admit. Likely due to dehdyration Hypophostaemia - 01/01/22; replaced Mild hypomagnesemia - 01/01/22    Plan  -   Anemia of chronic diseaes - Present on Admit  Plan  - - PRBC for hgb </= 6.9gm%    - exceptions are   -  if ACS susepcted/confirmed then transfuse for hgb </= 8.0gm%,  or     -  active bleeding with hemodynamic instability, then transfuse regardless of hemoglobin value   At at all times try to transfuse 1 unit prbc as possible with exception of active hemorrhage   Mild protein calorie malnutrition due to acute illness - Present on Admit Failure to thrive du eto acute illness and weight loss from IPF - Present on Admit  Plan  - monitor  Best practice:  Diet: NPO -> will need TF Pain/Anxiety/Delirium protocol (if indicated):  will need sedation protol VAP protocol (if indicated):  yes DVT prophylaxis: Lovenix - now on hold  due to hemoptysis and also blood on OG tube> will need to restart GI prophylaxis: ppi Glucose control: ssi Mobility: bed rest Code Status: full Family Communication: [patient and son Boris Lown Disposition:  ICU  Devices   - foley 01/01/22 - aline propsoed 01/01/22 - picc line proposed 01/01/22   ATTESTATION & SIGNATURE   The patient Kesley JENSYN CAMBRIA is critically ill with multiple organ systems failure and requires high complexity decision making for assessment and support, frequent evaluation and titration of therapies, application of advanced monitoring technologies and extensive interpretation of multiple databases.   Critical Care Time devoted to patient care services described in this note is  35  Minutes. This time reflects time of care of this signee Dr Brand Males. This critical care time does not reflect procedure time, or teaching time or supervisory time of PA/NP/Med student/Med Resident etc but could involve care discussion time      SIGNATURE    Dr. Brand Males, M.D., F.C.C.P,  Pulmonary and Critical Care Medicine Staff Physician, [place] Director - Interstitial Lung Disease  Program  Pulmonary [place] at [place], [place], [address]  NPI Number:  NPI #[id number]  Pager: [phone], If no answer  -> Check AMION or Try [phone] Telephone (clinical office): [phone] Telephone (research): [phone]  4:01 AM 01/01/2022   01/01/2022 4:01 AM    LABS    PULMONARY No results for input(s): "PHART", "PCO2ART", "PO2ART", "HCO3", "TCO2", "O2SAT" in the last 168 hours.  Invalid input(s): "PCO2", "PO2"  CBC Recent Labs  Lab 12/30/21 2235 12/31/21 0945  HGB 12.6* 12.3*  HCT 35.7* 34.8*  WBC 10.5 7.2  PLT 224 225    COAGULATION Recent Labs  Lab 12/30/21 2235  INR 1.2    CARDIAC  No results for input(s): "TROPONINI" in the last 168 hours. No results for input(s): "PROBNP" in the last 168 hours.  CHEMISTRY Recent Labs  Lab 12/30/21 2235 12/31/21 0945  NA 128* 134*  K 3.6 4.1  CL 95* 102  CO2 23 22  GLUCOSE 180* 203*  BUN 8 7*  CREATININE 0.95 0.84  CALCIUM 8.5* 8.5*  MG  --  1.9  PHOS  --  1.8*   Estimated Creatinine Clearance: 55.8 mL/min (by C-G formula based on SCr of 0.84 mg/dL).   LIVER Recent Labs  Lab 12/30/21 2235 12/31/21 0945  AST 37 28  ALT 28 25  ALKPHOS 57 58  BILITOT 0.6 0.3  PROT 5.8* 5.7*  ALBUMIN 2.4* 2.1*  INR 1.2  --      INFECTIOUS Recent Labs  Lab 12/30/21 2233 12/30/21 2235 12/31/21 0503  LATICACIDVEN 1.9  --  1.2  PROCALCITON  --  0.99  --      ENDOCRINE CBG (last 3)  No results for input(s): "GLUCAP" in the last 72 hours.       IMAGING x48h  - image(s) personally visualized  -   highlighted in bold DG Chest Port 1 View  Result Date: 01/01/2022 CLINICAL DATA:  Acute respiratory distress EXAM: PORTABLE CHEST 1 VIEW COMPARISON:  12/30/2021 FINDINGS: Pulmonary insufflation is stable. Extensive airspace infiltrate has progressed and is now seen diffusely throughout the lungs, more severe within the left mid lung zone. In the acute setting, this can be seen in the setting of pulmonary hemorrhage, rapidly progressive pneumonic infiltrate, or alveolar pulmonary edema. No pneumothorax  or pleural effusion. Cardiac size within normal  limits. No acute bone abnormality. IMPRESSION: Interval progression of diffuse airspace infiltrate, more severe within the left mid lung zone. Electronically Signed   By: Fidela Salisbury M.D.   On: 01/01/2022 03:45   VAS [place] LOWER EXTREMITY VENOUS (DVT)  Result Date: 12/31/2021  Lower Venous DVT Study Patient Name:  MICAJAH DENNIN  Date of Exam:   12/31/2021 Medical Rec #: [id number]              Accession #:    [id number] Date of Birth: 1940/08/25               Patient Gender: M Patient Age:   76 years Exam Location:  [facility] Procedure:      VAS [place] LOWER EXTREMITY VENOUS (DVT) Referring Phys: Brand Males --------------------------------------------------------------------------------  Indications: Covid-19, d-dimer.  Comparison Study: No prior studies. Performing Technologist: Darlin Coco RDMS, RVT  Examination Guidelines: A complete evaluation includes B-mode imaging, spectral Doppler, color Doppler, and power Doppler as needed of all accessible portions of each vessel. Bilateral testing is considered an integral part of a complete examination. Limited examinations for reoccurring indications may be performed as noted. The reflux portion of the exam is performed with the patient in reverse Trendelenburg.  +---------+---------------+---------+-----------+----------+--------------+ RIGHT    CompressibilityPhasicitySpontaneityPropertiesThrombus Aging +---------+---------------+---------+-----------+----------+--------------+ CFV      Full           Yes      Yes                                 +---------+---------------+---------+-----------+----------+--------------+ SFJ      Full                                                        +---------+---------------+---------+-----------+----------+--------------+ FV Prox  Full                                                        +---------+---------------+---------+-----------+----------+--------------+ FV Mid   Full                                                         +---------+---------------+---------+-----------+----------+--------------+ FV DistalFull                                                        +---------+---------------+---------+-----------+----------+--------------+ PFV      Full                                                        +---------+---------------+---------+-----------+----------+--------------+ POP      Full  Yes      Yes                                 +---------+---------------+---------+-----------+----------+--------------+ PTV      Full                                                        +---------+---------------+---------+-----------+----------+--------------+ PERO     Full                                                        +---------+---------------+---------+-----------+----------+--------------+   +---------+---------------+---------+-----------+----------+--------------+ LEFT     CompressibilityPhasicitySpontaneityPropertiesThrombus Aging +---------+---------------+---------+-----------+----------+--------------+ CFV      Full           Yes      Yes                                 +---------+---------------+---------+-----------+----------+--------------+ SFJ      Full                                                        +---------+---------------+---------+-----------+----------+--------------+ FV Prox  Full                                                        +---------+---------------+---------+-----------+----------+--------------+ FV Mid   Full                                                        +---------+---------------+---------+-----------+----------+--------------+ FV DistalFull                                                        +---------+---------------+---------+-----------+----------+--------------+ PFV      Full                                                         +---------+---------------+---------+-----------+----------+--------------+ POP      Full           Yes      Yes                                 +---------+---------------+---------+-----------+----------+--------------+ PTV  Full                                                        +---------+---------------+---------+-----------+----------+--------------+ PERO     Full                                                        +---------+---------------+---------+-----------+----------+--------------+     Summary: RIGHT: - There is no evidence of deep vein thrombosis in the lower extremity.  - No cystic structure found in the popliteal fossa.  LEFT: - There is no evidence of deep vein thrombosis in the lower extremity.  - No cystic structure found in the popliteal fossa.  *See table(s) above for measurements and observations. Electronically signed by Monica Martinez MD on 12/31/2021 at 1:28:37 PM.    Final    CT CHEST W CONTRAST  Result Date: 12/31/2021 CLINICAL DATA:  Pneumonia. EXAM: CT CHEST WITH CONTRAST TECHNIQUE: Multidetector CT imaging of the chest was performed during intravenous contrast administration. RADIATION DOSE REDUCTION: This exam was performed according to the departmental dose-optimization program which includes automated exposure control, adjustment of the mA and/or kV according to patient size and/or use of iterative reconstruction technique. CONTRAST:  48m OMNIPAQUE IOHEXOL 300 MG/ML  SOLN COMPARISON:  Chest radiograph dated 12/30/2021 and CT dated 12/18/2018. FINDINGS: Cardiovascular: Mild cardiomegaly. No pericardial effusion. Top-normal ascending aorta measure up to 4 cm in diameter. No dissection. There is mild atherosclerotic calcification of the thoracic aorta. No pulmonary artery embolus identified. Mediastinum/Nodes: Mildly enlarged bilateral hilar and mediastinal lymph nodes, reactive. The esophagus is grossly unremarkable. No mediastinal fluid  collection. Lungs/Pleura: Background of interstitial lung disease. Diffuse ground-glass opacity throughout the left lung, new since the prior CT most consistent with pneumonia. No pleural effusion or pneumothorax. The central airways are patent. Upper Abdomen: Fatty liver. A 5 mm nonobstructing right renal pole calculus. Small left renal upper pole cyst. Musculoskeletal: Degenerative changes of the spine. No acute osseous pathology. IMPRESSION: 1. No CT evidence of pulmonary artery embolus. 2. Background of interstitial lung disease with interval development of diffuse left lung opacities consistent with pneumonia. 3. Mildly enlarged bilateral hilar and mediastinal lymph nodes, reactive. 4. Fatty liver. 5. Nonobstructing right renal pole calculus. 6. Aortic Atherosclerosis (ICD10-I70.0). Electronically Signed   By: AAnner CreteM.D.   On: 12/31/2021 02:16   DG Chest Port 1 View  Result Date: 12/30/2021 CLINICAL DATA:  cough, sob, known IPF, ? covid EXAM: PORTABLE CHEST 1 VIEW COMPARISON:  CT chest 12/17/2021, chest x-ray 01/30/2018 FINDINGS: Left lung border not visualized due to overlying lung findings. The heart and mediastinal contours are otherwise unchanged. Atherosclerotic plaque. Interval development of left lung diffuse hazy airspace opacity with slight aeration of the left apex. Chronic coarsened markings with no overt pulmonary edema. No pleural effusion. No pneumothorax. No acute osseous abnormality. IMPRESSION: 1. Interval development of left lung diffuse hazy airspace opacity with slight aeration of the left apex. Underlying pulmonary fibrosis again noted. Recommend CT chest with intravenous contrast for further evaluation. 2.  Aortic Atherosclerosis (ICD10-I70.0). Electronically Signed   By: MIven FinnM.D.   On: 12/30/2021 22:32

## 2022-01-01 NOTE — Progress Notes (Signed)
[facility] Progress Note Patient Name: Jeff Willis DOB: Aug 24, 1940 MRN: [id number]   Date of Service  01/01/2022  HPI/Events of Note  Phosphorus 2.4, Troponin # 1 307, patient is already on aspirin and echocardiogram is pending. Patient had some blood in the stomach when NG tube was placed.  eICU Interventions  Phosphorus 15 mmol iv x 1,  will await second troponin result prior to decision on full heparinization for possible ACS, will also obtain 12 lead EKG.        Jeff Willis [name] 01/01/2022, 8:03 PM

## 2022-01-01 NOTE — Procedures (Signed)
Arterial Catheter Insertion Procedure Note  Jeff Willis  [id number]  May 30, 1940  Date:01/01/22  Time:5:29 AM    Provider Performing: Wynelle Beckmann    Procedure: Insertion of Arterial Line [phone]) without US guidance  Indication(s) Blood pressure monitoring and/or need for frequent ABGs  Consent Unable to obtain consent due to inability to find a medical decision maker for patient.  All reasonable efforts were made.  Another independent medical provider,   , confirmed the benefits of this procedure outweigh the risks.  Anesthesia None   Time Out Verified patient identification, verified procedure, site/side was marked, verified correct patient position, special equipment/implants available, medications/allergies/relevant history reviewed, required imaging and test results available.   Sterile Technique Maximal sterile technique including full sterile barrier drape, hand hygiene, sterile gown, sterile gloves, mask, hair covering, sterile ultrasound probe cover (if used).   Procedure Description Area of catheter insertion was cleaned with chlorhexidine and draped in sterile fashion. Without real-time ultrasound guidance an arterial catheter was placed into the right radial artery.  Appropriate arterial tracings confirmed on monitor.     Complications/Tolerance None; patient tolerated the procedure well.   EBL Minimal   Specimen(s) None

## 2022-01-01 NOTE — Progress Notes (Signed)
Peripherally Inserted Central Catheter Placement  The IV Nurse has discussed with the patient and/or persons authorized to consent for the patient, the purpose of this procedure and the potential benefits and risks involved with this procedure.  The benefits include less needle sticks, lab draws from the catheter, and the patient may be discharged home with the catheter. Risks include, but not limited to, infection, bleeding, blood clot (thrombus formation), and puncture of an artery; nerve damage and irregular heartbeat and possibility to perform a PICC exchange if needed/ordered by physician.  Alternatives to this procedure were also discussed.  Bard Power PICC patient education guide, fact sheet on infection prevention and patient information card has been provided to patient /or left at bedside.    PICC Placement Documentation  PICC Double Lumen 01/01/22 Right Brachial 39 cm 0 cm (Active)  Indication for Insertion or Continuance of Line Vasoactive infusions 01/01/22 1430  Exposed Catheter (cm) 0 cm 01/01/22 1430  Site Assessment Clean, Dry, Intact 01/01/22 1430  Lumen #1 Status Flushed;Saline locked;Blood return noted 01/01/22 1430  Lumen #2 Status Flushed;Saline locked;Blood return noted 01/01/22 1430  Dressing Type Transparent;Securing device 01/01/22 1430  Dressing Status Antimicrobial disc in place;Clean, Dry, Intact 01/01/22 1430  Safety Lock Not Applicable 01/65/53 7482  Line Adjustment (NICU/IV Team Only) No 01/01/22 1430  Dressing Intervention Other (Comment) 01/01/22 1430  Dressing Change Due 01/08/22 01/01/22 [place], [place] 01/01/2022, 2:31 PM

## 2022-01-01 NOTE — Progress Notes (Signed)
Date and time results received: 01/01/22 0724   Test: Troponin Critical Value: 307  Name of Provider Notified: Zola Button, MD  Orders Received? Or Actions Taken?:  awaiting orders

## 2022-01-01 NOTE — Significant Event (Signed)
Rapid Response Event Note   Reason for Call :  Respiratory distress, hypoxia-70% on NRB  Initial Focused Assessment:  Pt sitting up in bed in respiratory distress, +WOB, +accessory muscle use. Pt c/o acute onset SOB/CP. Lungs with rales/rhonchi t/o. Skin warm and dry.   T-97.4, HR-130s, RR-50s, SpO2-60s NRB.  15L HFNC added with SpO2 increasing to 70s.  Pt placed on 100% bipap-SpO2 increasing to low 90s.  Pt transferred to 3M14 on bipap with 5W RN, RRT, RT, and PCCM PA.  Interventions:  NRB EKG-Afib RVR PCXR-Interval progression of diffuse airspace infiltrate, more severe within the left mid lung zone. Bipap '40mg'$  lasix IV ABG-decision made to wait until after pt gets to ICU Tx to ICU Plan of Care:  Tx to 3M14.   Event Summary:   MD Notified: Dr. Marlowe Sax notified, Colletta [place], PCCM PA and Dr. Chase Caller PCCM MD notified and came to bedside Call [place] End Time:0400  Dillard Essex, RN

## 2022-01-01 NOTE — Progress Notes (Signed)
Pharmacy Insulin Management - ICU  CBGs above goal 140-180 mg/dL: Yes  Current regimen (include units of SSI in last 24hr): Moderate SSI  Medications affecting CBG levels: Steroids  Plan: Starting TF Increase SSI to resistant Add 2 unit q4h TF coverage  Barth Kirks, PharmD, BCCCP Clinical Pharmacist [phone]  Please check AMION for all [place] numbers  01/01/2022 9:50 AM

## 2022-01-01 NOTE — Progress Notes (Signed)
[facility] Progress Note Patient Name: LADDIE MATH DOB: Jan 23, 1941 MRN: [id number]   Date of Service  01/01/2022  HPI/Events of Note  Troponin # 2:  245, likely secondary to demand.  eICU Interventions  Will await echocardiogram results prior to additional aggressive Rx.        Kerry Kass Vidur Knust 01/01/2022, 11:53 PM

## 2022-01-01 NOTE — IPAL (Signed)
  Interdisciplinary Goals of Care Family Meeting   Date carried out: 01/01/2022  Location of the meeting: Bedside  Member's involved: Physician, Bedside Registered Nurse, Family Member or next of kin, and Other: Son  Hospital doctor or acting medical decision maker: Son [name]    Discussion: We discussed goals of care for Jeff Willis .  Covid, Rhinovirus, IPF, IPF flare - ARDS  Code status: Limited Code or DNR with short term  Disposition: Continue current acute care  Time spent for the meeting:  multipl meetings over time 01/01/2022     SIGNATURE    Dr. Brand Males, M.D., F.C.C.P,  Pulmonary and Critical Care Medicine Staff Physician, [place] Director - Interstitial Lung Disease  Program  Medical Director - [place] ICU Pulmonary [place] at [place], [place], [address]  NPI Number:  NPI #[id number] DEA Number: [id number]  Pager: [phone], If no answer  -> Check AMION or Try [place] Telephone (clinical office): [phone] Telephone (research): [phone]  5:00 AM 01/01/2022

## 2022-01-01 NOTE — Progress Notes (Signed)
NAME:  Jeff Willis, MRN:  [id number], DOB:  08-19-1940, LOS: 1 ADMISSION DATE:  12/30/2021, CONSULTATION DATE:  12/31/21 REFERRING MD:  Dr Jennette Kettle of [facility], CHIEF COMPLAINT:  IPF patient with acute covid-19   BRIEF   Jeff Willis -has known to have esophageal stenosis status post dilatation April 2023 by Dr. Saralyn Pilar on.  IPF on esbreit (FVC 84% and DLCO 45% - may 2023) on esbreit. On room air. Noral RHC 11/22/21.  Follows with Dr Chase Caller. HRCT 821'@3'$  with worsening.  Flew to [place] for a cruise.  On 12/24/2021 developed fever and cough.  On 12/26/2021 and the cruciate was diagnosed with COVID 19 acute infection.  Was offered antiviral Paxlovid but he refused because of negative experience previously with side effects.  He continued to have fever cough and failure to thrive, low appetite and being bedbound.  He returned to [place] via flight on 12/30/2021 and at home was continuing to cough.  Pulse ox check at home showed hypoxemia and therefore was brought to the emergency department via EMS.  Here he is resting on 3 L nasal cannula with stable work of breathing.  COVID PCR is positive.  CT chest with contrast [none PE protocol but no big PE noted.  He has left greater than right diffuse groundglass opacities.   Past Medical History:  IPF on esbriet  has a past medical history of Back pain, Elevated PSA, Essential tremor, Hypercholesteremia, Neuropathy, Prostate cancer ([place]), and Sinus tachycardia (01/30/2018).   Significant Hospital Events:  12/31/2021- Admit with IPF flare, dual virus infection - rhinovirus and covid.  On bipap 01/01/2022- Worsening resp status with hemoptysis, Intubated and placed on ARDS protocol. Also has A Fib RVR .   Interim History / Subjective:   Intubated overnight.  Remains on the ventilator  Objective   Blood pressure 110/74, pulse (!) 106, temperature (!) 96.7 F (35.9 C), temperature source Axillary, resp. rate 20, height '5\' 6"'$  (1.676 m),  weight 57.2 kg, SpO2 99 %.    Vent Mode: PRVC FiO2 (%):  [100 %] 100 % Set Rate:  [16 bmp] 16 bmp Vt Set:  [510 mL] 510 mL PEEP:  [10 cmH20] 10 cmH20 Plateau Pressure:  [33 cmH20] 33 cmH20   Intake/Output Summary (Last 24 hours) at 01/01/2022 0733 Last data filed at 01/01/2022 0700 Gross per 24 hour  Intake 2488.22 ml  Output 2525 ml  Net -36.78 ml   Filed Weights   12/30/21 2207 01/01/22 0400  Weight: 56.2 kg 57.2 kg    Examination: Blood pressure 110/74, pulse (!) 106, temperature (!) 96.7 F (35.9 C), temperature source Axillary, resp. rate 20, height '5\' 6"'$  (1.676 m), weight 57.2 kg, SpO2 99 %. Gen:      No acute distress, elderly HEENT:  EOMI, sclera anicteric Neck:     No masses; no thyromegaly, ET tube Lungs:    Clear to auscultation bilaterally; normal respiratory effort CV:         Regular rate and rhythm; no murmurs Abd:      + bowel sounds; soft, non-tender; no palpable masses, no distension Ext:    No edema; adequate peripheral perfusion Skin:      Warm and dry; no rash Neuro: Sedated, unresponsive  Labs/imaging reviewed Significant for ABG showing pH 7.25, PCO2 25 Potassium 2.9, creatinine 0.93 AST 46, ALT 42 WBC elevated at 21.3 D-dimer 6.31  Chest x-ray with diffuse bilateral airspace disease.  Resolved Hospital Problem list    Mild  to moderate hyponatremia   Assessment & Plan:  IPF flare in setting of COVID 19 and Rhinovirus infection  Start ARDS net protocol with 6 cc/kg TV ABG reviewed with resp acidosis.  Increased respiratory rate.  Repeat ABG Continue steroids Monitor hemoptysis which seems to have improved.  Restart Lovenox as he is at high risk for developing DVT Second dose of Tocilizumab today We will stop remdesivir as there is no benefit later in the course of disease and in ICU patients Continue Solu-Medrol CAP antibiotic coverage for now.  Low threshold to discontinue  Elevated d dimer likely secondary to COVID No PE noted on CT with  contrast on admission LE duplex is negative  Low K Replete  Atrial tachycardia/A fib.  In normal sinus rhythm now Continue Lopressor.  Hold if BP is low  DM SSI  Known esophageal dilatation April 2023 History of dysphagia for solids and spices NPO  Anemia of chronic diseaes - Present on Admit Monitor CBC  Mild protein calorie malnutrition due to acute illness - Present on Admit Failure to thrive due to acute illness and weight loss from IPF - Present on Admit Start tube feeds  Best practice:  Diet: Tube feeds Pain/Anxiety/Delirium protocol (if indicated):  will need sedation protol VAP protocol (if indicated): yes DVT prophylaxis: Lovenox  GI prophylaxis: ppi Glucose control: ssi Mobility: bed rest Code Status: full Family Communication: Family updated in detail at bedside on 8/5 Disposition:  ICU  Critical care time   The patient is critically ill with multiple organ system failure and requires high complexity decision making for assessment and support, frequent evaluation and titration of therapies, advanced monitoring, review of radiographic studies and interpretation of complex data.   Critical Care Time devoted to patient care services, exclusive of separately billable procedures, described in this note is 35 minutes.   Marshell Garfinkel MD [facility] See Amion for pager  If no response to pager , please call [phone] until 7pm After 7:00 pm call Elink  [phone] 01/01/2022, 2:45 PM

## 2022-01-01 NOTE — Progress Notes (Signed)
Telephone consent obtained from son Trevante Tennell for PICC.

## 2022-01-01 NOTE — Progress Notes (Signed)
PCCM note  Patient with low blood pressure, bradycardia Holding Lopressor and Lasix.  We will give bolus of IV fluid Start Levophed if needed Check EKG, echocardiogram  Marshell Garfinkel MD [facility] 01/01/2022, 5:52 PM

## 2022-01-01 NOTE — Progress Notes (Addendum)
eLink Physician-Brief Progress Note Patient Name: Jeff Willis DOB: 01-01-1941 MRN: [id number]   Date of Service  01/01/2022  HPI/Events of Note  Brief New admit note: 81 yr old male shifted to ICU for Hemoptysis, desaturating. Admitted yesterday for Covid/rhinovirus +, CAP. On remdesivir, toci, steroids. Rocephin and azee.  Hx ; has known to have esophageal stenosis status post dilatation April 2023 by Dr. Saralyn Pilar on.  IPF on esbreit (FVC 84% and DLCO 45% - may 2023) on esbreit. On room air. Normal RHC 11/22/21.    Data: reviewed 9/4 labs reviewed is stable. Sodium 134, improving. LA was normal. CTA no PE No DVT on USG legs. Ferrtin ok, elevated LDH. Pro cal ok  CxR: film reviewed. Worsening multifocal/lobar air space densities , left > right. Back ground IPF hx.  Dilated stomach from air hunger.   Camera evaluation done: On  BiPAP sats 97% on 100% fio2. RR > 18. No diaphoresis. Protecting airways. Sinus tachycardia. SBP 160. Temp 96.7  Plan: Intubation, follow ARDS protocol, lung protective ventilation.  Continue current antivirals. Follow ferritn.  Follow ABG, labs Transfuse if Hg < 7.   Lovenox on hold.   TDV:VOHYWVP SQ. - on hold. To go for SCD.  CBG goals < 180. on SSI.  On PPI  Kainat Pizana. Neomia Glass, MD, FCCP. [phone]  eICU Interventions  None at this time. As above.      Intervention Category Major Interventions: Respiratory failure - evaluation and management Intermediate Interventions: Best-practice therapies (e.g. DVT, beta blocker, etc.);Bleeding - evaluation and treatment with blood products Evaluation Type: New Patient Evaluation  Elmer Sow 01/01/2022, 4:08 AM  6:16 AM ABG reviewed. Camera: Discussed with RN. SBP 122. Sats 99%. Getting over 8 ml/ibw. Vent changed to 450/02/16/79%, follow ABG in 30 mins. On propofol, versed sedation. P peak < 25 now.   Will follow other labs.  CxR film seen; resolution of some air space density  compared to pre intubation, mostly has fluid component . Diuresing  6:49 Potassium 2.8, cr normal. Electrolyte replacement ordered. Follow K level.  Discussed with RN Cbg goal < 180, on q4 SSI coverage.

## 2022-01-01 NOTE — Progress Notes (Signed)
NAME:  [name], MRN:  [id number], DOB:  [birth year], LOS: 1 ADMISSION DATE:  12/30/2021, CONSULTATION DATE:  12/31/21 REFERRING MD:  Dr. Jennette Kettle, CHIEF COMPLAINT:  IPF patient with acute Covid-19   History of Present Illness:  Jeff Willis -has known to have esophageal stenosis status post dilatation April 2023 by Dr. Saralyn Pilar on.  IPF on esbreit (FVC 84% and DLCO 45% - may 2023) on esbreit. On room air. Noral RHC 11/22/21.  Follows with Dr Chase Caller. HRCT 821'@3'$  with worsening.  Flew to [place] for a cruise.  On 12/24/2021 developed fever and cough.  On 12/26/2021 and the cruciate was diagnosed with COVID 19 acute infection.  Was offered antiviral Paxlovid but he refused because of negative experience previously with side effects.  He continued to have fever cough and failure to thrive, low appetite and being bedbound.  He returned to [place] via flight on 12/30/2021 and at home was continuing to cough.  Pulse ox check at home showed hypoxemia and therefore was brought to the emergency department via EMS.  Here he is resting on 3 L nasal cannula with stable work of breathing.  COVID PCR is positive.  CT chest with contrast [none PE protocol but no big PE noted.  He has left greater than right diffuse groundglass opacities.  Pertinent  Medical History  IPF on esbriet, UIP, esophageal stenosis s/p dilatation 2023 Back pain, Elevated PSA, Essential tremor, Hypercholesteremia, Neuropathy, Prostate cancer ([place]), and Sinus tachycardia (01/30/2018).   Reports that he has never smoked. He has never used smokeless tobacco.   Has a past surgical history that includes Lumbar microdiscectomy; Radioactive seed implant (N/A, 02/24/2018); SPACE OAR INSTILLATION (N/A, 02/24/2018); Esophagogastroduodenoscopy (egd) with propofol (N/A, 08/10/2021); Colonoscopy with propofol (N/A, 08/10/2021); Esophageal dilation (08/10/2021); polypectomy (08/10/2021); Hemostasis clip placement (08/10/2021); and RIGHT  HEART CATH (N/A, 11/22/2021).  Significant Hospital Events: Including procedures, antibiotic start and stop dates in addition to other pertinent events   12/30/2021 - admitted 9/4- BiPAP, transferred to ICU 9/5- Intubated 0518 d/t respiratory failure, A line placed  Interim History / Subjective:  01/01/2022 -  Dual virus infection - rhinovirus and covid. Had some hemoptysis -> lovenox held -> then Suddnt worsening respiatory distress over 45 minutes -> no furthe hemoptysis - RR 30s, look of fear, higher o2 need 100% needing BiPAP . Also, A Fib RVR .  Son at bedside.  CXR diffuse GGO/ILD infiltrates, Intubated at 0518, Arterial line placed   Objective   Blood pressure (!) 89/67, pulse 88, temperature 97.6 F (36.4 C), temperature source Axillary, resp. rate (!) 32, height '5\' 6"'$  (1.676 m), weight 57.2 kg, SpO2 99 %.    Vent Mode: PRVC FiO2 (%):  [70 %-100 %] 70 % Set Rate:  [16 bmp-32 bmp] 32 bmp Vt Set:  [380 mL-510 mL] 380 mL PEEP:  [10 cmH20] 10 cmH20 Plateau Pressure:  [26 cmH20-33 cmH20] 26 cmH20   Intake/Output Summary (Last 24 hours) at 01/01/2022 1054 Last data filed at 01/01/2022 0700 Gross per 24 hour  Intake 2248.22 ml  Output 2000 ml  Net 248.22 ml   Filed Weights   12/30/21 2207 01/01/22 0400  Weight: 56.2 kg 57.2 kg    Examination: General: critically ill appearing, intubated and sedated HENT: NCAT, tubes in place-small amount of blood in OG tube, ETT tube and OG tube in place, neck supple Lungs: No retractions on vent, diminished lung sounds L>R, crackles in lower lung fields Cardiovascular:  RRR no m/r/g, no  CVP  Abdomen: Soft, no masses palpated Extremities: Not spontaneously moving, perfused Neuro: Sedated GU: deferred  Resolved Hospital Problem list     Assessment & Plan:  IPF moderate to mild at baseline on pirfenidone (normal RHC July 2023) Acute hypoxemic respiratory failure secondary to left greater than right groundglass opacity secondary to acute  COVID-19 [aspiration in the differential diagnosis] and Rhinorivurs - Present on  Features c/w IPF flare up - Present on Admit 01/01/2022 -> worsening respiratory distress and failure - severe. Meets intubation criteria. Features c/w IPF flare up. Moved to ICU on BiPAP Intubated 9/5 per ARDS protocol Acute COVID-19-symptom onset 12/24/2021 while in the cruise.   - Present on Admit Concurrent Rhinovirus 12/31/21 - Present on Admit             - Hypoxemia since 12/30/2021 - CRP greater than 20 - CT with contrast without PE and Duplex LE neg DVT - CRP still high despite Tocilizumab x 1-redose - Remdesivir d/c - Steroids for COVID (increase SSI) - Empiric CAP coverage but dc based on urine strep and leg an dPCT - Await PCT - Await urine legionella - Check QuantiFERON gold   Known esophageal dilatation April 2023 History of dysphagia for solids and spices 01/01/22 - some blood in OG post intubation -N.p.o. but for sips with meds -PPI IV BID -OG tube feeds   Mild to moderate hyponatremia, resolved - new Na 128 at admit. Likely due to dehdyration 136 today Hypophostaemia - 01/01/22; replaced Mild hypomagnesemia - 01/01/22 Hypokalemia 2.9 from 2.8- replete   Anemia of chronic diseaes - Present on Admit Stable at 13.6 today, no drop Plan  - - PRBC for hgb </= 6.9gm%               - exceptions are                         -  if ACS susepcted/confirmed then transfuse for hgb </= 8.0gm%,  or                          -  active bleeding with hemodynamic instability, then transfuse regardless of hemoglobin value              At at all times try to transfuse 1 unit prbc as possible with exception of active hemorrhage   Mild protein calorie malnutrition due to acute illness - Present on Admit Failure to thrive du eto acute illness and weight loss from IPF - Present on Admit Plan  - monitor  Best Practice (right click and "Reselect all SmartList Selections" daily)   Diet/type: tubefeeds DVT  prophylaxis: other, hgb stable plan to restart today GI prophylaxis: PPI Lines: Arterial Line, needs PICC line Foley:  Yes, and it is still needed Code Status:  limited Last date of multidisciplinary goals of care discussion [9/5-family present at bedside and discussed with Dr. Vaughan Browner  Labs   CBC: Recent Labs  Lab 12/30/21 2235 12/31/21 0945 01/01/22 0530 01/01/22 0539 01/01/22 0833  WBC 10.5 7.2 21.3*  --   --   NEUTROABS 9.0* 6.6 19.7*  --   --   HGB 12.6* 12.3* 13.6 13.6 12.6*  HCT 35.7* 34.8* 38.3* 40.0 37.0*  MCV 92.0 92.3 91.2  --   --   PLT 224 225 269  --   --     Basic Metabolic Panel: Recent Labs  Lab 12/30/21 2235 12/31/21  0945 01/01/22 0530 01/01/22 0539 01/01/22 0833  NA 128* 134* 136 139 136  K 3.6 4.1 2.8* 2.9* 3.0*  CL 95* 102 99  --   --   CO2 '23 22 28  '$ --   --   GLUCOSE 180* 203* 243*  --   --   BUN 8 7* 12  --   --   CREATININE 0.95 0.84 0.93  --   --   CALCIUM 8.5* 8.5* 8.0*  --   --   MG  --  1.9 1.8  --   --   PHOS  --  1.8* 4.7*  --   --    GFR: Estimated Creatinine Clearance: 51.3 mL/min (by C-G formula based on SCr of 0.93 mg/dL). Recent Labs  Lab 12/30/21 2233 12/30/21 2235 12/31/21 0503 12/31/21 0945 01/01/22 0530  PROCALCITON  --  0.99  --   --   --   WBC  --  10.5  --  7.2 21.3*  LATICACIDVEN 1.9  --  1.2  --   --     Liver Function Tests: Recent Labs  Lab 12/30/21 2235 12/31/21 0945 01/01/22 0530  AST 37 28 46*  ALT 28 25 42  ALKPHOS 57 58 90  BILITOT 0.6 0.3 0.6  PROT 5.8* 5.7* 5.8*  ALBUMIN 2.4* 2.1* 2.1*   No results for input(s): "LIPASE", "AMYLASE" in the last 168 hours. No results for input(s): "AMMONIA" in the last 168 hours.  ABG    Component Value Date/Time   PHART 7.277 (L) 01/01/2022 0833   PCO2ART 72.1 (HH) 01/01/2022 0833   PO2ART 272 (H) 01/01/2022 0833   HCO3 33.9 (H) 01/01/2022 0833   TCO2 36 (H) 01/01/2022 0833   O2SAT 100 01/01/2022 0833     Coagulation Profile: Recent Labs  Lab  12/30/21 2235  INR 1.2    Cardiac Enzymes: No results for input(s): "CKTOTAL", "CKMB", "CKMBINDEX", "TROPONINI" in the last 168 hours.  HbA1C: Hgb A1c MFr Bld  Date/Time Value Ref Range Status  01/01/2022 05:30 AM 5.8 (H) 4.8 - 5.6 % Final    Comment:    (NOTE) Pre diabetes:          5.7%-6.4%  Diabetes:              >6.4%  Glycemic control for   <7.0% adults with diabetes     CBG: Recent Labs  Lab 01/01/22 0405 01/01/22 0818  GLUCAP 251* 183*    Review of Systems:   Intubated and sedated  Past Medical History:  He,  has a past medical history of Back pain, Elevated PSA, Essential tremor, Hypercholesteremia, Neuropathy, Prostate cancer ([place]), and Sinus tachycardia (01/30/2018).   Surgical History:   Past Surgical History:  Procedure Laterality Date   COLONOSCOPY WITH PROPOFOL N/A 08/10/2021   Procedure: COLONOSCOPY WITH PROPOFOL;  Surgeon: Carol Ada, MD;  Location: [facility];  Service: Gastroenterology;  Laterality: N/A;   ESOPHAGEAL DILATION  08/10/2021   Procedure: ESOPHAGEAL DILATION;  Surgeon: Carol Ada, MD;  Location: [facility];  Service: Gastroenterology;;   ESOPHAGOGASTRODUODENOSCOPY (EGD) WITH PROPOFOL N/A 08/10/2021   Procedure: ESOPHAGOGASTRODUODENOSCOPY (EGD) WITH PROPOFOL;  Surgeon: Carol Ada, MD;  Location: [facility];  Service: Gastroenterology;  Laterality: N/A;   HEMOSTASIS CLIP PLACEMENT  08/10/2021   Procedure: HEMOSTASIS CLIP PLACEMENT;  Surgeon: Carol Ada, MD;  Location: [facility];  Service: Gastroenterology;;   LUMBAR MICRODISCECTOMY     L4-5   POLYPECTOMY  08/10/2021   Procedure: POLYPECTOMY;  Surgeon: Benson Norway,  Saralyn Pilar, MD;  Location: [facility];  Service: Gastroenterology;;   RADIOACTIVE SEED IMPLANT N/A 02/24/2018   Procedure: RADIOACTIVE SEED IMPLANT/BRACHYTHERAPY IMPLANT;  Surgeon: Festus Aloe, MD;  Location[facility];  Service: Urology;  Laterality: N/A;   RIGHT HEART CATH N/A 11/22/2021    Procedure: RIGHT HEART CATH;  Surgeon: Troy Sine, MD;  Location: [facility];  Service: Cardiovascular;  Laterality: N/A;   SPACE OAR INSTILLATION N/A 02/24/2018   Procedure: SPACE OAR INSTILLATION;  Surgeon: Festus Aloe, MD;  Location: [facility];  Service: Urology;  Laterality: N/A;     Social History:   reports that he has never smoked. He has never used smokeless tobacco. He reports that he does not drink alcohol and does not use drugs.   Family History:  His family history includes Other in his father and mother. There is no history of Cancer.   Allergies Allergies  Allergen Reactions   Meat Extract     vegetarian   Ofev [Nintedanib] Other (See Comments)    Weight loss     Home Medications  Prior to Admission medications   Medication Sig Start Date End Date Taking? Authorizing Provider  aspirin EC 81 MG tablet Take 81 mg by mouth daily. Swallow whole.   Yes [provider]  metoprolol succinate (TOPROL XL) 25 MG 24 hr tablet Take 1 tablet (25 mg total) by mouth daily. 02/05/21  Yes Troy Sine, MD  pantoprazole (PROTONIX) 40 MG tablet Take 40 mg by mouth daily as needed (heartburn). 12/16/19  Yes [provider]  Pirfenidone (ESBRIET) 267 MG TABS Take 2 tablets (534 mg total) by mouth in the morning, at noon, and at bedtime. **LOW DOSE AS MAINTENANCE** Patient taking differently: Take 267 mg by mouth in the morning, at noon, and at bedtime. **LOW DOSE AS MAINTENANCE** 10/25/21  Yes Brand Males, MD  rosuvastatin (CRESTOR) 10 MG tablet Take 10 mg by mouth at bedtime. 10/04/21  Yes [provider]     Critical care time:

## 2022-01-01 NOTE — Progress Notes (Signed)
Arterial line unable to draw back at this time. RT attempted to reposition and redress/ new pressure bag made. RT noted pt wrist with hard spot just above insertion site. Arterial line removed per Dr. Vaughan Browner at this time. Pt tolerated procedure well. RT will continue to monitor and be available as needed.

## 2022-01-01 NOTE — Progress Notes (Addendum)
Initial Nutrition Assessment  DOCUMENTATION CODES:   Severe malnutrition in context of chronic illness  INTERVENTION:   Initiate tube feeding via OG tube: Vital 1.5 at 25 ml and increase by 10 ml every 8 hours to goal rate of 55 ml/h (1320 ml per day)  Provides 1980 kcal, 89 gm protein, 1003 ml free water daily  Monitor magnesium and phosphorus every 12 hours x 4 occurrences, MD to replete as needed, as pt is at risk for refeeding syndrome given severe malnutrition.   NUTRITION DIAGNOSIS:   Severe Malnutrition related to chronic illness as evidenced by severe muscle depletion, severe fat depletion.  GOAL:   Patient will meet greater than or equal to 90% of their needs  MONITOR:   TF tolerance  REASON FOR ASSESSMENT:   Consult Enteral/tube feeding initiation and management  ASSESSMENT:   Pt with PMH of esophageal stenosis s/p dilatation 07/2021, and Idiopathic Pulmonary Fibrosis on esbreit flew to [place] for a cruise and developed a fever and cough 8/28, dx with COVID 8/30, flew home 9/3 and admitted.   Pt discussed during ICU rounds and with RN and MD.  Damaris Schooner with pt's daughter-in-laws. He and his wife live with a son/DIL. She reports that he has been losing weight but feels it has been gradual. She thinks he has a good appetite but has very limited intake due to persistent cough. Per family pt eats very small meals throughout the day. B: toast, SN: 1/2 cup yogurt, L: rice and lentils, SN: puffed cereal, D: rice, lentils, vegetables  Pt does not eat EGGS or MEAT    9/3 admitted 9/4 BiPAP, tx ICU 9/5 intubated due to respiratory failure    Medications reviewed and include: vitamin C 500 mg daily, colace, 2 units novolog every 4 hours, SSI, solu-medrol, protonix , miralax, zinc 220 mg daily  Fentanyl  Versed  KCl x 6   Labs reviewed: K 2.8 -> 2.9 -> 3.0 -> 3.8 PO4: 4.7 CRP: 20   OG tube; per xray tip in stomach   NUTRITION - FOCUSED PHYSICAL EXAM:  Flowsheet Row  Most Recent Value  Orbital Region Moderate depletion  Upper Arm Region Severe depletion  Thoracic and Lumbar Region Severe depletion  Buccal Region Moderate depletion  Temple Region Moderate depletion  Clavicle Bone Region Severe depletion  Clavicle and Acromion Bone Region Severe depletion  Scapular Bone Region Severe depletion  Dorsal Hand Unable to assess  Patellar Region Severe depletion  Anterior Thigh Region Severe depletion  Posterior Calf Region Moderate depletion  Edema (RD Assessment) None  Hair Reviewed  Eyes Unable to assess  Mouth Unable to assess  Skin Reviewed  Nails Unable to assess       Diet Order:   Diet Order             Diet NPO time specified Except for: Other (See Comments)  Diet effective now                   EDUCATION NEEDS:   No education needs have been identified at this time  Skin:  Skin Assessment: Reviewed RN Assessment  Last BM:  unknown  Height:   Ht Readings from Last 1 Encounters:  01/01/22 '5\' 6"'$  (1.676 m)    Weight:   Wt Readings from Last 1 Encounters:  01/01/22 57.2 kg    BMI:  Body mass index is 20.35 kg/m.  Estimated Nutritional Needs:   Kcal:  1700-1900  Protein:  90-110 grams  Fluid:  >  1.7 L/day  Lockie Pares., RD, LDN, CNSC See AMiON for contact information

## 2022-01-01 NOTE — Progress Notes (Signed)
Overnight event  Patient had an episode of mild hemoptysis but stable otherwise.  I have discontinued Lovenox for now and ordered SCDs for DVT prophylaxis as DVT study negative.  Last dose of Lovenox 12/31/2021 at 0926.  Consider restarting Lovenox in the morning if no recurrence of hemoptysis.

## 2022-01-02 ENCOUNTER — Inpatient Hospital Stay (HOSPITAL_COMMUNITY): Payer: Medicare Other

## 2022-01-02 DIAGNOSIS — R9431 Abnormal electrocardiogram [ECG] [EKG]: Secondary | ICD-10-CM | POA: Diagnosis not present

## 2022-01-02 DIAGNOSIS — J9601 Acute respiratory failure with hypoxia: Secondary | ICD-10-CM | POA: Diagnosis not present

## 2022-01-02 LAB — PHOSPHORUS
Phosphorus: 3 mg/dL (ref 2.5–4.6)
Phosphorus: 2.7 mg/dL (ref 2.5–4.6)

## 2022-01-02 LAB — CBC WITH DIFFERENTIAL/PLATELET
Abs Immature Granulocytes: 0.15 10*3/uL — ABNORMAL HIGH (ref 0.00–0.07)
Abs Immature Granulocytes: 0.12 10*3/uL — ABNORMAL HIGH (ref 0.00–0.07)
Basophils Absolute: 0 10*3/uL (ref 0.0–0.1)
Basophils Absolute: 0 10*3/uL (ref 0.0–0.1)
Basophils Relative: 0 %
Basophils Relative: 0 %
Eosinophils Absolute: 0 10*3/uL (ref 0.0–0.5)
Eosinophils Absolute: 0 10*3/uL (ref 0.0–0.5)
Eosinophils Relative: 0 %
Eosinophils Relative: 0 %
HCT: 29.1 % — ABNORMAL LOW (ref 39.0–52.0)
HCT: 30.6 % — ABNORMAL LOW (ref 39.0–52.0)
Hemoglobin: 10.4 g/dL — ABNORMAL LOW (ref 13.0–17.0)
Hemoglobin: 10.6 g/dL — ABNORMAL LOW (ref 13.0–17.0)
Immature Granulocytes: 1 %
Immature Granulocytes: 1 %
Lymphocytes Relative: 4 %
Lymphocytes Relative: 4 %
Lymphs Abs: 0.6 10*3/uL — ABNORMAL LOW (ref 0.7–4.0)
Lymphs Abs: 0.5 10*3/uL — ABNORMAL LOW (ref 0.7–4.0)
MCH: 33.4 pg (ref 26.0–34.0)
MCH: 32.9 pg (ref 26.0–34.0)
MCHC: 35.7 g/dL (ref 30.0–36.0)
MCHC: 34.6 g/dL (ref 30.0–36.0)
MCV: 93.6 fL (ref 80.0–100.0)
MCV: 95 fL (ref 80.0–100.0)
Monocytes Absolute: 0.5 10*3/uL (ref 0.1–1.0)
Monocytes Absolute: 0.5 10*3/uL (ref 0.1–1.0)
Monocytes Relative: 3 %
Monocytes Relative: 3 %
Neutro Abs: 15.3 10*3/uL — ABNORMAL HIGH (ref 1.7–7.7)
Neutro Abs: 13.1 10*3/uL — ABNORMAL HIGH (ref 1.7–7.7)
Neutrophils Relative %: 92 %
Neutrophils Relative %: 92 %
Platelets: 208 10*3/uL (ref 150–400)
Platelets: 194 10*3/uL (ref 150–400)
RBC: 3.11 MIL/uL — ABNORMAL LOW (ref 4.22–5.81)
RBC: 3.22 MIL/uL — ABNORMAL LOW (ref 4.22–5.81)
RDW: 13.1 % (ref 11.5–15.5)
RDW: 13.1 % (ref 11.5–15.5)
WBC: 16.5 10*3/uL — ABNORMAL HIGH (ref 4.0–10.5)
WBC: 14.3 10*3/uL — ABNORMAL HIGH (ref 4.0–10.5)
nRBC: 0 % (ref 0.0–0.2)
nRBC: 0 % (ref 0.0–0.2)

## 2022-01-02 LAB — POCT I-STAT 7, (LYTES, BLD GAS, ICA,H+H)
Acid-Base Excess: 5 mmol/L — ABNORMAL HIGH (ref 0.0–2.0)
Acid-Base Excess: 4 mmol/L — ABNORMAL HIGH (ref 0.0–2.0)
Bicarbonate: 31.7 mmol/L — ABNORMAL HIGH (ref 20.0–28.0)
Bicarbonate: 30.5 mmol/L — ABNORMAL HIGH (ref 20.0–28.0)
Calcium, Ion: 1.22 mmol/L (ref 1.15–1.40)
Calcium, Ion: 1.26 mmol/L (ref 1.15–1.40)
HCT: 28 % — ABNORMAL LOW (ref 39.0–52.0)
HCT: 31 % — ABNORMAL LOW (ref 39.0–52.0)
Hemoglobin: 9.5 g/dL — ABNORMAL LOW (ref 13.0–17.0)
Hemoglobin: 10.5 g/dL — ABNORMAL LOW (ref 13.0–17.0)
O2 Saturation: 91 %
O2 Saturation: 99 %
Patient temperature: 97.6
Patient temperature: 36.1
Potassium: 4.2 mmol/L (ref 3.5–5.1)
Potassium: 4.8 mmol/L (ref 3.5–5.1)
Sodium: 134 mmol/L — ABNORMAL LOW (ref 135–145)
Sodium: 135 mmol/L (ref 135–145)
TCO2: 33 mmol/L — ABNORMAL HIGH (ref 22–32)
TCO2: 32 mmol/L (ref 22–32)
pCO2 arterial: 55.6 mmHg — ABNORMAL HIGH (ref 32–48)
pCO2 arterial: 52.8 mmHg — ABNORMAL HIGH (ref 32–48)
pH, Arterial: 7.362 (ref 7.35–7.45)
pH, Arterial: 7.366 (ref 7.35–7.45)
pO2, Arterial: 62 mmHg — ABNORMAL LOW (ref 83–108)
pO2, Arterial: 124 mmHg — ABNORMAL HIGH (ref 83–108)

## 2022-01-02 LAB — LACTATE DEHYDROGENASE: LDH: 268 U/L — ABNORMAL HIGH (ref 98–192)

## 2022-01-02 LAB — LEGIONELLA PNEUMOPHILA SEROGP 1 UR AG: L. pneumophila Serogp 1 Ur Ag: NEGATIVE

## 2022-01-02 LAB — ECHOCARDIOGRAM COMPLETE
AR max vel: 2.18 cm2
AV Area VTI: 2.47 cm2
AV Area mean vel: 2.01 cm2
AV Mean grad: 3 mmHg
AV Peak grad: 6.4 mmHg
Ao pk vel: 1.26 m/s
Area-P 1/2: 3.05 cm2
Calc EF: 59.1 %
Height: 66 in
S' Lateral: 3 cm
Single Plane A2C EF: 59.6 %
Single Plane A4C EF: 55.8 %
Weight: 2081.14 oz

## 2022-01-02 LAB — MAGNESIUM
Magnesium: 2.4 mg/dL (ref 1.7–2.4)
Magnesium: 2.3 mg/dL (ref 1.7–2.4)

## 2022-01-02 LAB — GLUCOSE, CAPILLARY
Glucose-Capillary: 178 mg/dL — ABNORMAL HIGH (ref 70–99)
Glucose-Capillary: 157 mg/dL — ABNORMAL HIGH (ref 70–99)
Glucose-Capillary: 213 mg/dL — ABNORMAL HIGH (ref 70–99)
Glucose-Capillary: 199 mg/dL — ABNORMAL HIGH (ref 70–99)
Glucose-Capillary: 203 mg/dL — ABNORMAL HIGH (ref 70–99)
Glucose-Capillary: 221 mg/dL — ABNORMAL HIGH (ref 70–99)

## 2022-01-02 LAB — COMPREHENSIVE METABOLIC PANEL
ALT: 39 U/L (ref 0–44)
AST: 39 U/L (ref 15–41)
Albumin: 1.7 g/dL — ABNORMAL LOW (ref 3.5–5.0)
Alkaline Phosphatase: 66 U/L (ref 38–126)
Anion gap: 7 (ref 5–15)
BUN: 22 mg/dL (ref 8–23)
CO2: 28 mmol/L (ref 22–32)
Calcium: 8 mg/dL — ABNORMAL LOW (ref 8.9–10.3)
Chloride: 103 mmol/L (ref 98–111)
Creatinine, Ser: 0.89 mg/dL (ref 0.61–1.24)
GFR, Estimated: >60 mL/min (ref >60)
Glucose, Bld: 175 mg/dL — ABNORMAL HIGH (ref 70–99)
Potassium: 4.3 mmol/L (ref 3.5–5.1)
Sodium: 138 mmol/L (ref 135–145)
Total Bilirubin: 0.2 mg/dL — ABNORMAL LOW (ref 0.3–1.2)
Total Protein: 4.6 g/dL — ABNORMAL LOW (ref 6.5–8.1)

## 2022-01-02 LAB — D-DIMER, QUANTITATIVE: D-Dimer, Quant: 5.8 ug/mL-FEU — ABNORMAL HIGH (ref 0.00–0.50)

## 2022-01-02 LAB — C-REACTIVE PROTEIN: CRP: 8.6 mg/dL — ABNORMAL HIGH (ref <1.0)

## 2022-01-02 LAB — SEDIMENTATION RATE: Sed Rate: 24 mm/hr — ABNORMAL HIGH (ref 0–16)

## 2022-01-02 LAB — FERRITIN: Ferritin: 102 ng/mL (ref 24–336)

## 2022-01-02 MED ORDER — ASPIRIN 81 MG PO CHEW
81.0000 mg | CHEWABLE_TABLET | Freq: Every day | ORAL | Status: DC
  Administered 2022-01-03 – 2022-01-05 (×3): 81 mg
  Filled 2022-01-02 (×3): qty 1

## 2022-01-02 MED ORDER — DEXMEDETOMIDINE HCL IN NACL 400 MCG/100ML IV SOLN
0.0000 ug/kg/h | INTRAVENOUS | Status: DC
  Administered 2022-01-02: 0.4 ug/kg/h via INTRAVENOUS
  Administered 2022-01-03: 0.7 ug/kg/h via INTRAVENOUS
  Administered 2022-01-03: 0.8 ug/kg/h via INTRAVENOUS
  Filled 2022-01-02 (×3): qty 100

## 2022-01-02 MED ORDER — MIDAZOLAM HCL 2 MG/2ML IJ SOLN
1.0000 mg | INTRAMUSCULAR | Status: DC | PRN
  Administered 2022-01-03: 1 mg via INTRAVENOUS
  Filled 2022-01-02: qty 2

## 2022-01-02 MED ORDER — ARTIFICIAL TEARS OPHTHALMIC OINT
TOPICAL_OINTMENT | Freq: Three times a day (TID) | OPHTHALMIC | Status: AC | PRN
  Filled 2022-01-02: qty 3.5

## 2022-01-02 MED ORDER — ROSUVASTATIN CALCIUM 20 MG PO TABS
10.0000 mg | ORAL_TABLET | Freq: Every day | ORAL | Status: DC
  Administered 2022-01-02 – 2022-01-04 (×3): 10 mg
  Filled 2022-01-02 (×3): qty 1

## 2022-01-02 MED ORDER — ZINC SULFATE 220 (50 ZN) MG PO CAPS
220.0000 mg | ORAL_CAPSULE | Freq: Every day | ORAL | Status: DC
  Administered 2022-01-03 – 2022-01-05 (×3): 220 mg
  Filled 2022-01-02 (×3): qty 1

## 2022-01-02 MED ORDER — ASPIRIN 81 MG PO CHEW: 81.0000 mg | CHEWABLE_TABLET | Freq: Every day | ORAL | Status: DC

## 2022-01-02 MED ORDER — LACTATED RINGERS IV BOLUS
500.0000 mL | Freq: Once | INTRAVENOUS | Status: AC
  Administered 2022-01-02: 500 mL via INTRAVENOUS

## 2022-01-02 MED ORDER — MIDAZOLAM HCL 2 MG/2ML IJ SOLN
1.0000 mg | INTRAMUSCULAR | Status: DC | PRN
  Administered 2022-01-03 (×2): 1 mg via INTRAVENOUS
  Filled 2022-01-02 (×2): qty 2

## 2022-01-02 MED ORDER — VITAMIN C 500 MG PO TABS
500.0000 mg | ORAL_TABLET | Freq: Every day | ORAL | Status: DC
Start: 2022-01-03 — End: 2022-01-05
  Administered 2022-01-03 – 2022-01-05 (×3): 500 mg
  Filled 2022-01-02 (×3): qty 1

## 2022-01-02 MED ORDER — LACTATED RINGERS IV BOLUS
500.0000 mL | Freq: Once | INTRAVENOUS | Status: AC
Start: 2022-01-02 — End: 2022-01-02
  Administered 2022-01-02: 500 mL via INTRAVENOUS

## 2022-01-02 NOTE — Progress Notes (Signed)
ABG results given to Dr. Vaughan Browner. No new orders received at this time. FiO2 weaned to 50% at this time. RT will continue to monitor and be available as needed.

## 2022-01-02 NOTE — Progress Notes (Signed)
[facility] Progress Note Patient Name: ADIS STURGILL DOB: [birth year] MRN: [id number]   Date of Service  01/02/2022  HPI/Events of Note  Patient is oliguric. RN is requesting eye drops for lubrication.  eICU Interventions  LR 500 ml iv bolus x 1 ordered. Tears Plus ordered tid PRN.        Kerry Kass Joshva Labreck 01/02/2022, 5:46 AM

## 2022-01-02 NOTE — TOC Progression Note (Signed)
Transition of Care [facility]) - Initial/Assessment Note    Patient Details  Name: Jeff Willis MRN: [id number] Date of Birth: 04/18/1941  Transition of Care [facility]) CM/SW Contact:    Milinda Antis, [place] Phone Number: 01/02/2022, 11:05 AM  Clinical Narrative:                  Transition of Care Department [facility]) has reviewed patient.  Patient is from home with spouse  and admitted for dual virus infection - rhinovirus and covid.  Patient is currently intubated.    We will continue to monitor patient advancement through interdisciplinary progression rounds.           Patient Goals and CMS Choice        Expected Discharge Plan and Services                                                Prior Living Arrangements/Services                       Activities of Daily Living Home Assistive Devices/Equipment: None ADL Screening (condition at time of admission) Patient's cognitive ability adequate to safely complete daily activities?: Yes Is the patient deaf or have difficulty hearing?: No Does the patient have difficulty seeing, even when wearing glasses/contacts?: No Does the patient have difficulty concentrating, remembering, or making decisions?: No Patient able to express need for assistance with ADLs?: Yes Does the patient have difficulty dressing or bathing?: No Independently performs ADLs?: Yes (appropriate for developmental age) Does the patient have difficulty walking or climbing stairs?: No Weakness of Legs: None Weakness of Arms/Hands: None  Permission Sought/Granted                  Emotional Assessment              Admission diagnosis:  Acute respiratory failure with hypoxia ([place]) [J96.01] Community acquired pneumonia, unspecified laterality [J18.9] Sepsis, due to unspecified organism, unspecified whether acute organ dysfunction present ([place]) [A41.9] COVID-19 [U07.1] Patient Active Problem List   Diagnosis Date Noted    Protein-calorie malnutrition, severe 01/01/2022   Acute respiratory failure with hypoxia ([place]) 12/31/2021   COVID-19 virus infection 12/31/2021   History of sinus tachycardia 12/31/2021   Hyponatremia 12/31/2021   Pneumonia due to COVID-19 virus 12/31/2021   Colon cancer screening 07/27/2020   Dry mouth 07/27/2020   Epigastric pain 07/27/2020   Gastroesophageal reflux disease 07/27/2020   Spinal stenosis of lumbosacral region 07/27/2020   Interstitial lung disease ([place]) 09/09/2019   Sacroiliitis ([place]) 08/04/2019   PAD (peripheral artery disease) ([place]) 08/04/2019   Upper airway cough syndrome 08/04/2019   Pain in left knee 08/12/2018   Malignant neoplasm of prostate ([place]) 10/08/2017   Cough 01/23/2017   Laryngopharyngeal reflux (LPR) 01/23/2017   Right lumbar radiculopathy 01/22/2017   PCP:  Gaynelle Arabian, MD Pharmacy:   [facility] [id number], [place] [address] Phone: [phone] Fax: [place], [place] [address] [place] [address] Phone: [phone] Fax: [phone]     Social Determinants of Health (SDOH) Interventions    Readmission Risk Interventions     No data to display

## 2022-01-02 NOTE — Progress Notes (Addendum)
NAME:  Jeff Willis, MRN:  [id number], DOB:  Jun 14, 1940, LOS: 2 ADMISSION DATE:  12/30/2021, CONSULTATION DATE:  12/31/21 REFERRING MD:  Dr. Jennette Willis, CHIEF COMPLAINT:  IPF patient with acute Covid-19   History of Present Illness:  Jeff Willis -has known to have esophageal stenosis status post dilatation April 2023 by Dr. Saralyn Willis on.  IPF on esbreit (FVC 84% and DLCO 45% - may 2023) on esbreit. On room air. Noral RHC 11/22/21.  Follows with Dr Jeff Willis. HRCT 821'@3'$  with worsening.  Flew to [place] for a cruise.  On 12/24/2021 developed fever and cough.  On 12/26/2021 and the cruciate was diagnosed with COVID 19 acute infection.  Was offered antiviral Paxlovid but he refused because of negative experience previously with side effects.  He continued to have fever cough and failure to thrive, low appetite and being bedbound.  He returned to [place] via flight on 12/30/2021 and at home was continuing to cough.  Pulse ox check at home showed hypoxemia and therefore was brought to the emergency department via EMS.  Here he is resting on 3 L nasal cannula with stable work of breathing.  COVID PCR is positive.  CT chest with contrast [none PE protocol but no big PE noted.  He has left greater than right diffuse groundglass opacities.  Pertinent  Medical History  IPF on esbriet, UIP, esophageal stenosis s/p dilatation 2023 Back pain, Elevated PSA, Essential tremor, Hypercholesteremia, Neuropathy, Prostate cancer (Jeff Willis), and Sinus tachycardia (01/30/2018).   Reports that he has never smoked. He has never used smokeless tobacco.   Has a past surgical history that includes Lumbar microdiscectomy; Radioactive seed implant (N/A, 02/24/2018); SPACE OAR INSTILLATION (N/A, 02/24/2018); Esophagogastroduodenoscopy (egd) with propofol (N/A, 08/10/2021); Colonoscopy with propofol (N/A, 08/10/2021); Esophageal dilation (08/10/2021); polypectomy (08/10/2021); Hemostasis clip placement (08/10/2021); and RIGHT  HEART CATH (N/A, 11/22/2021).  Significant Hospital Events: Including procedures, antibiotic start and stop dates in addition to other pertinent events   12/30/2021 - admitted 9/4- BiPAP, transferred to ICU 9/5- Intubated 0518 d/t respiratory failure, A line placed, levophed [id number]  Interim History / Subjective:  01/01/2022 -  Dual virus infection - rhinovirus and covid. Had some hemoptysis -> lovenox held -> then Suddnt worsening respiatory distress over 45 minutes -> no furthe hemoptysis - RR 30s, look of fear, higher o2 need 100% needing BiPAP . Also, A Fib RVR .  Son at bedside.  CXR diffuse GGO/ILD infiltrates, Intubated at 0518, Arterial line placed. Low pressures/bradycardia > gave bolus of IV fluid > EKG low voltage > Echocardiogram ordered > Levophed needed from 1829-2041   Objective   Blood pressure 94/65, pulse 75, temperature 97.6 F (36.4 C), temperature source Oral, resp. rate (!) 32, height '5\' 6"'$  (1.676 m), weight 59 kg, SpO2 100 %.    Vent Mode: PRVC FiO2 (%):  [60 %-80 %] 80 % Set Rate:  [32 bmp] 32 bmp Vt Set:  [380 mL] 380 mL PEEP:  [10 cmH20] 10 cmH20 Plateau Pressure:  [21 cmH20-28 cmH20] 21 cmH20   Intake/Output Summary (Last 24 hours) at 01/02/2022 0706 Last data filed at 01/02/2022 0600 Gross per 24 hour  Intake 2495.8 ml  Output 950 ml  Net 1545.8 ml    Filed Weights   12/30/21 2207 01/01/22 0400 01/02/22 0407  Weight: 56.2 kg 57.2 kg 59 kg   Examination: General: critically ill appearing gentleman, pupils small,   HENT: NCAT, ET tube in place, OG tube in place feed running, Lips mildly dry  mouth agape, pupils constricted but reactive   Lungs: No retractions on vent, anterior lung field clear, diminished L lower fields, no crackles auscultated Cardiovascular:  RRR no m/r/g Abdomen: Soft, no masses palpated Extremities: No spontaneous movement, no significant LE edema, SCDs in place Neuro: Sedated GU: deferred  Bcx no growth 2 days ABG 7.362 pH, PCO2  55.6, PO2 62 from 103 Na 134 from 138 LDH 268 from 361 Ferritin 102 from 150 CRP 8.6 from 20 LA 1.8 from 1.9 WBC 16.5 from 21.3 Hgb 9.5 from 10.4 from 11.6 Echo ordered UOP 0.7  Resolved Hospital Problem list     Assessment & Plan:  IPF flare in setting of Covid-19 and Rhinovirus CXR improved today. Improved inflammatory markers today. ABG with PaO2 decreasing>consider proning today. Negative urine strep pneumo  - ARDS protocol - Monitor hemoptysis-continue lovenox as high risk for DVT - Steroids for COVID (increase SSI) - Empiric CAP coverage but dc based on leg - Await PCT - Await urine legionella - Check QuantiFERON gold - Consider proning today   HypotensionBradycardia S/p levophed for a couple hours last night. MAPS 70s this morning.  -hold lasix and lopressor -monitor, fluid boluses as tolerated -f/u echocardiogram  Oliguria 0.7 ml/kg/hr today. Cr 0.89. S/p 2 LR 500 mL boluses (last one 0645) -monitor UOP -check CVP -look at RAP on echo   Elevated d dimer likely secondary to COVID No PE noted on CT with contrast on admission LE duplex is negative  Elevated Troponin Likely demand. -Echocardiogram  Atrial tachycardia/A fib.  In normal sinus rhythm now -Hold lopressor d/t hypotension and bradycardia  Known esophageal dilatation April 2023 History of dysphagia for solids and spices 01/01/22 - some blood in OG post intubation -N.p.o. -PPI IV BID -OG tube feeds   Mild to moderate hyponatremia, resolved - new Na 128 at admit. Likely due to dehdyration 134 today Hypophostaemia - 01/01/22; replaced Mild hypomagnesemia - 01/01/22 Hypokalemia-01/01/22  replete prn   Anemia of chronic disease - Present on Admit Steadily declining to 9.5 from 10.4 from 11.6. No significant bleeding per nursing - likely dilutional component as well Plan  - - PRBC for hgb </= 6.9gm%               - exceptions are                         -  if ACS susepcted/confirmed then transfuse for  hgb </= 8.0gm%,  or                          -  active bleeding with hemodynamic instability, then transfuse regardless of hemoglobin value              At at all times try to transfuse 1 unit prbc as possible with exception of active hemorrhage   Mild protein calorie malnutrition due to acute illness - Present on Admit -tube feeds Failure to thrive due to acute illness and weight loss from IPF - Present on Admit Plan  - monitor  Best Practice (right click and "Reselect all SmartList Selections" daily)   Diet/type: tubefeeds DVT prophylaxis: lovenox GI prophylaxis: PPI Lines: Arterial Line, PICC line Foley:  Yes, and it is still needed Code Status:  limited Last date of multidisciplinary goals of care discussion [9/5-family present at bedside and discussed with Dr. Vaughan Browner  Labs   CBC: Recent Labs  Lab 12/30/21 2235 12/31/21 0945  01/01/22 0530 01/01/22 0539 01/01/22 0833 01/01/22 1239 01/02/22 0355 01/02/22 0535  WBC 10.5 7.2 21.3*  --   --   --  16.5*  --   NEUTROABS 9.0* 6.6 19.7*  --   --   --  15.3*  --   HGB 12.6* 12.3* 13.6 13.6 12.6* 11.6* 10.4* 9.5*  HCT 35.7* 34.8* 38.3* 40.0 37.0* 34.0* 29.1* 28.0*  MCV 92.0 92.3 91.2  --   --   --  93.6  --   PLT 224 225 269  --   --   --  208  --      Basic Metabolic Panel: Recent Labs  Lab 12/30/21 2235 12/31/21 0945 01/01/22 0530 01/01/22 0539 01/01/22 0833 01/01/22 1239 01/01/22 1808 01/02/22 0355 01/02/22 0535  NA 128* 134* 136   < > 136 138 136 138 134*  K 3.6 4.1 2.8*   < > 3.0* 3.8 4.7  4.8 4.3 4.2  CL 95* 102 99  --   --   --  103 103  --   CO2 '23 22 28  '$ --   --   --  28 28  --   GLUCOSE 180* 203* 243*  --   --   --  174* 175*  --   BUN 8 7* 12  --   --   --  16 22  --   CREATININE 0.95 0.84 0.93  --   --   --  1.02 0.89  --   CALCIUM 8.5* 8.5* 8.0*  --   --   --  7.9* 8.0*  --   MG  --  1.9 1.8  --   --   --  2.4 2.4  --   PHOS  --  1.8* 4.7*  --   --   --  2.4* 3.0  --    < > = values in this  interval not displayed.    GFR: Estimated Creatinine Clearance: 55.2 mL/min (by C-G formula based on SCr of 0.89 mg/dL). Recent Labs  Lab 12/30/21 2233 12/30/21 2235 12/31/21 0503 12/31/21 0945 01/01/22 0530 01/01/22 1800 01/01/22 2035 01/02/22 0355  PROCALCITON  --  0.99  --   --   --   --   --   --   WBC  --  10.5  --  7.2 21.3*  --   --  16.5*  LATICACIDVEN 1.9  --  1.2  --   --  1.9 1.8  --      Liver Function Tests: Recent Labs  Lab 12/30/21 2235 12/31/21 0945 01/01/22 0530 01/02/22 0355  AST 37 28 46* 39  ALT 28 25 42 39  ALKPHOS 57 58 90 66  BILITOT 0.6 0.3 0.6 0.2*  PROT 5.8* 5.7* 5.8* 4.6*  ALBUMIN 2.4* 2.1* 2.1* 1.7*    No results for input(s): "LIPASE", "AMYLASE" in the last 168 hours. No results for input(s): "AMMONIA" in the last 168 hours.  ABG    Component Value Date/Time   PHART 7.362 01/02/2022 0535   PCO2ART 55.6 (H) 01/02/2022 0535   PO2ART 62 (L) 01/02/2022 0535   HCO3 31.7 (H) 01/02/2022 0535   TCO2 33 (H) 01/02/2022 0535   O2SAT 91 01/02/2022 0535     Coagulation Profile: Recent Labs  Lab 12/30/21 2235  INR 1.2     Cardiac Enzymes: No results for input(s): "CKTOTAL", "CKMB", "CKMBINDEX", "TROPONINI" in the last 168 hours.  HbA1C: Hgb A1c MFr Bld  Date/Time Value Ref  Range Status  01/01/2022 05:30 AM 5.8 (H) 4.8 - 5.6 % Final    Comment:    (NOTE) Pre diabetes:          5.7%-6.4%  Diabetes:              >6.4%  Glycemic control for   <7.0% adults with diabetes     CBG: Recent Labs  Lab 01/01/22 1139 01/01/22 1528 01/01/22 1936 01/01/22 2326 01/02/22 0308  GLUCAP 157* 138* 218* 218* 178*     Review of Systems:   Intubated and sedated  Past Medical History:  He,  has a past medical history of Back pain, Elevated PSA, Essential tremor, Hypercholesteremia, Neuropathy, Prostate cancer ([place]), and Sinus tachycardia (01/30/2018).   Surgical History:   Past Surgical History:  Procedure Laterality Date    COLONOSCOPY WITH PROPOFOL N/A 08/10/2021   Procedure: COLONOSCOPY WITH PROPOFOL;  Surgeon: Carol Ada, MD;  Location: [facility];  Service: Gastroenterology;  Laterality: N/A;   ESOPHAGEAL DILATION  08/10/2021   Procedure: ESOPHAGEAL DILATION;  Surgeon: Carol Ada, MD;  Location: [facility];  Service: Gastroenterology;;   ESOPHAGOGASTRODUODENOSCOPY (EGD) WITH PROPOFOL N/A 08/10/2021   Procedure: ESOPHAGOGASTRODUODENOSCOPY (EGD) WITH PROPOFOL;  Surgeon: Carol Ada, MD;  Location: [facility];  Service: Gastroenterology;  Laterality: N/A;   HEMOSTASIS CLIP PLACEMENT  08/10/2021   Procedure: HEMOSTASIS CLIP PLACEMENT;  Surgeon: Carol Ada, MD;  Location: [facility];  Service: Gastroenterology;;   LUMBAR MICRODISCECTOMY     L4-5   POLYPECTOMY  08/10/2021   Procedure: POLYPECTOMY;  Surgeon: Carol Ada, MD;  Location: [facility];  Service: Gastroenterology;;   RADIOACTIVE SEED IMPLANT N/A 02/24/2018   Procedure: RADIOACTIVE SEED IMPLANT/BRACHYTHERAPY IMPLANT;  Surgeon: Festus Aloe, MD;  Location: [facility];  Service: Urology;  Laterality: N/A;   RIGHT HEART CATH N/A 11/22/2021   Procedure: RIGHT HEART CATH;  Surgeon: Troy Sine, MD;  Location: [facility];  Service: Cardiovascular;  Laterality: N/A;   SPACE OAR INSTILLATION N/A 02/24/2018   Procedure: SPACE OAR INSTILLATION;  Surgeon: Festus Aloe, MD;  Location: [facility];  Service: Urology;  Laterality: N/A;     Social History:   reports that he has never smoked. He has never used smokeless tobacco. He reports that he does not drink alcohol and does not use drugs.   Family History:  His family history includes Other in his father and mother. There is no history of Cancer.   Allergies Allergies  Allergen Reactions   Meat Extract     vegetarian   Ofev [Nintedanib] Other (See Comments)    Weight loss     Home Medications  Prior to Admission medications    Medication Sig Start Date End Date Taking? Authorizing Provider  aspirin EC 81 MG tablet Take 81 mg by mouth daily. Swallow whole.   Yes [provider]  metoprolol succinate (TOPROL XL) 25 MG 24 hr tablet Take 1 tablet (25 mg total) by mouth daily. 02/05/21  Yes Troy Sine, MD  pantoprazole (PROTONIX) 40 MG tablet Take 40 mg by mouth daily as needed (heartburn). 12/16/19  Yes [provider]  Pirfenidone (ESBRIET) 267 MG TABS Take 2 tablets (534 mg total) by mouth in the morning, at noon, and at bedtime. **LOW DOSE AS MAINTENANCE** Patient taking differently: Take 267 mg by mouth in the morning, at noon, and at bedtime. **LOW DOSE AS MAINTENANCE** 10/25/21  Yes Brand Males, MD  rosuvastatin (CRESTOR) 10 MG tablet Take 10 mg  by mouth at bedtime. 10/04/21  Yes [provider]     Critical care time:

## 2022-01-02 NOTE — Progress Notes (Signed)
  Echocardiogram 2D Echocardiogram has been performed.  Lana Fish 01/02/2022, 12:09 PM

## 2022-01-03 ENCOUNTER — Inpatient Hospital Stay (HOSPITAL_COMMUNITY): Payer: Medicare Other

## 2022-01-03 DIAGNOSIS — J9601 Acute respiratory failure with hypoxia: Secondary | ICD-10-CM | POA: Diagnosis not present

## 2022-01-03 LAB — POCT I-STAT 7, (LYTES, BLD GAS, ICA,H+H)
Acid-Base Excess: 7 mmol/L — ABNORMAL HIGH (ref 0.0–2.0)
Bicarbonate: 32.5 mmol/L — ABNORMAL HIGH (ref 20.0–28.0)
Calcium, Ion: 1.26 mmol/L (ref 1.15–1.40)
HCT: 33 % — ABNORMAL LOW (ref 39.0–52.0)
Hemoglobin: 11.2 g/dL — ABNORMAL LOW (ref 13.0–17.0)
O2 Saturation: 99 %
Patient temperature: 97.7
Potassium: 4.9 mmol/L (ref 3.5–5.1)
Sodium: 138 mmol/L (ref 135–145)
TCO2: 34 mmol/L — ABNORMAL HIGH (ref 22–32)
pCO2 arterial: 49 mmHg — ABNORMAL HIGH (ref 32–48)
pH, Arterial: 7.428 (ref 7.35–7.45)
pO2, Arterial: 136 mmHg — ABNORMAL HIGH (ref 83–108)

## 2022-01-03 LAB — BASIC METABOLIC PANEL
Anion gap: 4 — ABNORMAL LOW (ref 5–15)
BUN: 29 mg/dL — ABNORMAL HIGH (ref 8–23)
CO2: 27 mmol/L (ref 22–32)
Calcium: 8 mg/dL — ABNORMAL LOW (ref 8.9–10.3)
Chloride: 106 mmol/L (ref 98–111)
Creatinine, Ser: 0.9 mg/dL (ref 0.61–1.24)
GFR, Estimated: >60 mL/min (ref >60)
Glucose, Bld: 203 mg/dL — ABNORMAL HIGH (ref 70–99)
Potassium: 4.8 mmol/L (ref 3.5–5.1)
Sodium: 137 mmol/L (ref 135–145)

## 2022-01-03 LAB — GLUCOSE, CAPILLARY
Glucose-Capillary: 212 mg/dL — ABNORMAL HIGH (ref 70–99)
Glucose-Capillary: 188 mg/dL — ABNORMAL HIGH (ref 70–99)
Glucose-Capillary: 207 mg/dL — ABNORMAL HIGH (ref 70–99)
Glucose-Capillary: 124 mg/dL — ABNORMAL HIGH (ref 70–99)
Glucose-Capillary: 210 mg/dL — ABNORMAL HIGH (ref 70–99)
Glucose-Capillary: 201 mg/dL — ABNORMAL HIGH (ref 70–99)

## 2022-01-03 LAB — CBC WITH DIFFERENTIAL/PLATELET
Abs Immature Granulocytes: 0.14 10*3/uL — ABNORMAL HIGH (ref 0.00–0.07)
Basophils Absolute: 0 10*3/uL (ref 0.0–0.1)
Basophils Relative: 0 %
Eosinophils Absolute: 0 10*3/uL (ref 0.0–0.5)
Eosinophils Relative: 0 %
HCT: 34.2 % — ABNORMAL LOW (ref 39.0–52.0)
Hemoglobin: 11.7 g/dL — ABNORMAL LOW (ref 13.0–17.0)
Immature Granulocytes: 1 %
Lymphocytes Relative: 3 %
Lymphs Abs: 0.4 10*3/uL — ABNORMAL LOW (ref 0.7–4.0)
MCH: 32.6 pg (ref 26.0–34.0)
MCHC: 34.2 g/dL (ref 30.0–36.0)
MCV: 95.3 fL (ref 80.0–100.0)
Monocytes Absolute: 0.5 10*3/uL (ref 0.1–1.0)
Monocytes Relative: 4 %
Neutro Abs: 12.2 10*3/uL — ABNORMAL HIGH (ref 1.7–7.7)
Neutrophils Relative %: 92 %
Platelets: 211 10*3/uL (ref 150–400)
RBC: 3.59 MIL/uL — ABNORMAL LOW (ref 4.22–5.81)
RDW: 13 % (ref 11.5–15.5)
WBC: 13.3 10*3/uL — ABNORMAL HIGH (ref 4.0–10.5)
nRBC: 0 % (ref 0.0–0.2)

## 2022-01-03 LAB — PHOSPHORUS: Phosphorus: 2.8 mg/dL (ref 2.5–4.6)

## 2022-01-03 LAB — COMPREHENSIVE METABOLIC PANEL
ALT: 34 U/L (ref 0–44)
AST: 19 U/L (ref 15–41)
Albumin: 1.9 g/dL — ABNORMAL LOW (ref 3.5–5.0)
Alkaline Phosphatase: 73 U/L (ref 38–126)
Anion gap: 5 (ref 5–15)
BUN: 24 mg/dL — ABNORMAL HIGH (ref 8–23)
CO2: 28 mmol/L (ref 22–32)
Calcium: 8.2 mg/dL — ABNORMAL LOW (ref 8.9–10.3)
Chloride: 101 mmol/L (ref 98–111)
Creatinine, Ser: 0.82 mg/dL (ref 0.61–1.24)
GFR, Estimated: >60 mL/min (ref >60)
Glucose, Bld: 222 mg/dL — ABNORMAL HIGH (ref 70–99)
Potassium: 5.2 mmol/L — ABNORMAL HIGH (ref 3.5–5.1)
Sodium: 134 mmol/L — ABNORMAL LOW (ref 135–145)
Total Bilirubin: 0.4 mg/dL (ref 0.3–1.2)
Total Protein: 4.9 g/dL — ABNORMAL LOW (ref 6.5–8.1)

## 2022-01-03 LAB — D-DIMER, QUANTITATIVE: D-Dimer, Quant: 3.4 ug/mL-FEU — ABNORMAL HIGH (ref 0.00–0.50)

## 2022-01-03 LAB — LACTATE DEHYDROGENASE: LDH: 254 U/L — ABNORMAL HIGH (ref 98–192)

## 2022-01-03 LAB — C-REACTIVE PROTEIN: CRP: 4.9 mg/dL — ABNORMAL HIGH (ref <1.0)

## 2022-01-03 LAB — FERRITIN: Ferritin: 94 ng/mL (ref 24–336)

## 2022-01-03 LAB — MAGNESIUM: Magnesium: 2.4 mg/dL (ref 1.7–2.4)

## 2022-01-03 LAB — SEDIMENTATION RATE: Sed Rate: 20 mm/hr — ABNORMAL HIGH (ref 0–16)

## 2022-01-03 MED ORDER — INSULIN ASPART 100 UNIT/ML IJ SOLN
4.0000 [IU] | INTRAMUSCULAR | Status: DC
  Administered 2022-01-03 – 2022-01-04 (×5): 4 [IU] via SUBCUTANEOUS

## 2022-01-03 MED ORDER — PREDNISONE 20 MG PO TABS
50.0000 mg | ORAL_TABLET | Freq: Every day | ORAL | Status: DC
  Administered 2022-01-03: 50 mg

## 2022-01-03 MED ORDER — INSULIN DETEMIR 100 UNIT/ML ~~LOC~~ SOLN
6.0000 [IU] | Freq: Two times a day (BID) | SUBCUTANEOUS | Status: DC
Start: 2022-01-03 — End: 2022-01-03
  Administered 2022-01-03: 6 [IU] via SUBCUTANEOUS
  Filled 2022-01-03 (×2): qty 0.06

## 2022-01-03 MED ORDER — INSULIN DETEMIR 100 UNIT/ML ~~LOC~~ SOLN
6.0000 [IU] | Freq: Two times a day (BID) | SUBCUTANEOUS | Status: DC
  Administered 2022-01-04 – 2022-01-05 (×4): 6 [IU] via SUBCUTANEOUS
  Filled 2022-01-03 (×5): qty 0.06

## 2022-01-03 MED ORDER — FUROSEMIDE 10 MG/ML IJ SOLN
20.0000 mg | Freq: Once | INTRAMUSCULAR | Status: AC
  Administered 2022-01-03: 20 mg via INTRAVENOUS
  Filled 2022-01-03: qty 2

## 2022-01-03 MED ORDER — PROPOFOL 1000 MG/100ML IV EMUL
5.0000 ug/kg/min | INTRAVENOUS | Status: DC
  Administered 2022-01-03: 10 ug/kg/min via INTRAVENOUS
  Filled 2022-01-03: qty 200

## 2022-01-03 MED ORDER — DEXMEDETOMIDINE HCL IN NACL 400 MCG/100ML IV SOLN
0.4000 ug/kg/h | INTRAVENOUS | Status: DC
  Administered 2022-01-03: 0.4 ug/kg/h via INTRAVENOUS
  Administered 2022-01-04: 0.5 ug/kg/h via INTRAVENOUS
  Filled 2022-01-03 (×2): qty 100

## 2022-01-03 MED ORDER — INSULIN GLARGINE-YFGN 100 UNIT/ML ~~LOC~~ SOLN
12.0000 [IU] | Freq: Every day | SUBCUTANEOUS | Status: DC
  Filled 2022-01-03: qty 0.12

## 2022-01-03 NOTE — Progress Notes (Signed)
[place] Progress Note Patient Name: DELWYN SCOGGIN DOB: October 31, 1940 MRN: [id number]   Date of Service  01/03/2022  HPI/Events of Note  Agitation - Patient dropped BP on higher dose Propofol.  eICU Interventions  Plan: Increase ceiling on Fentanyl IV infusion to 400 mcg/hour. Titrate to RASS = 0.     Intervention Category Major Interventions: Delirium, psychosis, severe agitation - evaluation and management  Chidinma Clites [place] 01/03/2022, 10:26 PM

## 2022-01-03 NOTE — Progress Notes (Signed)
PCCM note  ABG 7.43, PCo2 49, P02 136 Reduce FiO2 to 30% and PEEP to 5.   Increase tidal volume to 7 cc/kg as he is dyssynchronous with the vent at low tidal volumes when awake. Respiratory reduced to 28 Start reducing sedation in preparation for pressure support weans.  Marshell Garfinkel MD [facility] 01/03/2022, 4:05 PM

## 2022-01-03 NOTE — Progress Notes (Signed)
[place] Progress Note Patient Name: Jeff Willis DOB: June 27, 1940 MRN: [id number]   Date of Service  01/03/2022  HPI/Events of Note  K+ 5.22, Cr 0.82.  eICU Interventions  Trend K+.        [name] 01/03/2022, 6:42 AM

## 2022-01-03 NOTE — Progress Notes (Addendum)
CBGs above goal 140-180 mg/dL: Yes  Current regimen (include units of SSI in last 24hr): resistant sliding scale (required 42u SSI/24h)  Lispro 2 Q4H  TDD of insulin (54)  Medications affecting CBG levels: Steroids, tapering down from high dose methylpred to oral prednisone   Plan: TF stable  Start levemir 6 units BID and lispro 4 Q4H (TDD: 36).  Discussed with diabetic coordinator.

## 2022-01-03 NOTE — Progress Notes (Addendum)
NAME:  Jeff Willis, MRN:  [id number], DOB:  July 23, 1940, LOS: 3 ADMISSION DATE:  12/30/2021, CONSULTATION DATE:  12/31/21 REFERRING MD:  Dr. Jennette Kettle, CHIEF COMPLAINT:  IPF patient with acute Covid-19   History of Present Illness:  Jeff Willis -has known to have esophageal stenosis status post dilatation April 2023 by Dr. Saralyn Pilar on.  IPF on esbreit (FVC 84% and DLCO 45% - may 2023) on esbreit. On room air. Noral RHC 11/22/21.  Follows with Dr Chase Caller. HRCT 821'@3'$  with worsening.  Flew to [place] for a cruise.  On 12/24/2021 developed fever and cough.  On 12/26/2021 and the cruciate was diagnosed with COVID 19 acute infection.  Was offered antiviral Paxlovid but he refused because of negative experience previously with side effects.  He continued to have fever cough and failure to thrive, low appetite and being bedbound.  He returned to [place] via flight on 12/30/2021 and at home was continuing to cough.  Pulse ox check at home showed hypoxemia and therefore was brought to the emergency department via EMS.  Here he is resting on 3 L nasal cannula with stable work of breathing.  COVID PCR is positive.  CT chest with contrast [none PE protocol but no big PE noted.  He has left greater than right diffuse groundglass opacities.  Pertinent  Medical History  IPF on esbriet, UIP, esophageal stenosis s/p dilatation 2023 Back pain, Elevated PSA, Essential tremor, Hypercholesteremia, Neuropathy, Prostate cancer ([place]), and Sinus tachycardia (01/30/2018).   Reports that he has never smoked. He has never used smokeless tobacco.   Has a past surgical history that includes Lumbar microdiscectomy; Radioactive seed implant (N/A, 02/24/2018); SPACE OAR INSTILLATION (N/A, 02/24/2018); Esophagogastroduodenoscopy (egd) with propofol (N/A, 08/10/2021); Colonoscopy with propofol (N/A, 08/10/2021); Esophageal dilation (08/10/2021); polypectomy (08/10/2021); Hemostasis clip placement (08/10/2021); and RIGHT  HEART CATH (N/A, 11/22/2021).  Significant Hospital Events: Including procedures, antibiotic start and stop dates in addition to other pertinent events   12/30/2021 - admitted 9/4- BiPAP, transferred to ICU 9/5- Intubated 0518 d/t respiratory failure, A line placed, levophed [id number]  Interim History / Subjective:  01/01/2022 -  Dual virus infection - rhinovirus and covid. Had some hemoptysis -> lovenox held -> then Suddnt worsening respiatory distress over 45 minutes -> no furthe hemoptysis - RR 30s, look of fear, higher o2 need 100% needing BiPAP . Also, A Fib RVR .  Son at bedside.  CXR diffuse GGO/ILD infiltrates, Intubated at 0518, Arterial line placed. Low pressures/bradycardia > gave bolus of IV fluid > EKG low voltage > Echocardiogram ordered > Levophed needed from 1829-2041 9/6 fluid bolus for pressures, off pressors 9/7 AM some agitation / asynchrony with the vent - PRN versed given   Objective   Blood pressure 123/73, pulse (!) 59, temperature (!) 97.5 F (36.4 C), temperature source Oral, resp. rate (!) 27, height '5\' 6"'$  (1.676 m), weight 62.7 kg, SpO2 100 %. CVP:  [4 mmHg-13 mmHg] 6 mmHg  Vent Mode: PRVC FiO2 (%):  [40 %-80 %] 40 % Set Rate:  [32 bmp] 32 bmp Vt Set:  [380 mL] 380 mL PEEP:  [10 cmH20] 10 cmH20 Plateau Pressure:  [25 cmH20-27 cmH20] 25 cmH20   Intake/Output Summary (Last 24 hours) at 01/03/2022 0656 Last data filed at 01/03/2022 0600 Gross per 24 hour  Intake 2537.19 ml  Output 600 ml  Net 1937.19 ml   UOP 600 (0.4 ml/kg/hr) Filed Weights   01/01/22 0400 01/02/22 0407 01/03/22 0350  Weight: 57.2 kg 59  kg 62.7 kg   Examination: General: Critically ill appearing, does not awaken to tactile/voice stimuli, pupils constrictive but reactive HENT: NCAT, mildy chapped lips, no nasal drainage or congestion, ET and OG tube in place Lungs: CTAB in anterior lung fields, mildly diminished in lower left base, no retractions or increased effort of breathing on  vent Cardiovascular:  RRR no m/r/g, 2+ radial pulses Abdomen: Soft, nondistended, no masses palapted Extremities: Earlier did have spontaneous movement in morning, no edema, no pitting edema, well perfused Neuro: Sedated GU: external urinary catheter in place  Bcx no growth 3 days Na 134 from 135 K 5.2 from 4.8 Cr 0.82 from 0.89 LDH 254 from 268 Sed rate 20 from 24 D dimer 3.4 from 5.8 Ferritin 94 from 102 CRP 4.9 from 8.6 LA 1.8 from 1.9 WBC 13.3 from 14.3 Hgb 11.7 from 10.6 Ulegionella negative Echo-EF 55-60%, no wall motion abnormalities, RAP of 3, no atrial shunts (Previously 65-70%) Resolved Hospital Problem list     Assessment & Plan:  IPF flare in setting of Covid-19 and Rhinovirus CXR similar to slightly improved from yesterday. Improved inflammatory markers. Will obtain PM ABG. Negative urine strep pneumo and legionella  - ARDSnet protocol - Monitor hemoptysis-continue lovenox as high risk for DVT - Steroids for COVID - transitioning to prednisone today - Empiric CAP coverage continue for 7 days total - F/u QuantiFERON gold - Weaning PEEP as tolerated - Lasix 20 IV x 1 - PM ABG - Sedation with fentanyl, precedex and prn versed > consider propofol instead of precedex if patient waking up/agitated  HypotensionBradycardia, improved S/p levophed on 9/5. MAPS 80s this morning off of pressors/fluid boluses. Echo with RAP of 3 -hold lopressor -monitor  Oliguria 0.4 ml/kg/hr today. Cr 0.82.  -monitor UOP -Lasix 20 x 1 -check CVP   Hyperkalemia 5.2 today. -Lasix -Increasing insulin -monitor labs and CRM  DM -rSSI -increasing per protocol-long acting 6 and 4 units q4h  Elevated d dimer likely secondary to COVID No PE noted on CT with contrast on admission LE duplex is negative  Elevated Troponin Likely demand. Echo without wall motion abnormalities  Atrial tachycardia/A fib.  In normal sinus rhythm now -Hold lopressor d/t hypotension and  bradycardia  Known esophageal dilatation April 2023 History of dysphagia for solids and spices 01/01/22 - some blood in OG post intubation -N.p.o. -PPI IV BID -OG tube feeds  Mild to moderate hyponatremia, resolved - new Na 128 at admit. Likely due to dehdyration 134 today Hypophostaemia - 01/01/22; replaced Mild hypomagnesemia - 01/01/22 Hypokalemia-01/01/22  replete prn   Anemia of chronic disease - Present on Admit 11.7 from 10.6. Stable, no active bleeding/hemoptysis -monitor while on lovenox   Mild protein calorie malnutrition due to acute illness - Present on Admit -tube feeds  Failure to thrive due to acute illness and weight loss from IPF - Present on Admit Plan  - monitor  Best Practice (right click and "Reselect all SmartList Selections" daily)   Diet/type: tubefeeds DVT prophylaxis: lovenox GI prophylaxis: PPI Lines: Arterial Line, PICC line Foley:  No urinary cath Code Status:  limited Last date of multidisciplinary goals of care discussion 9/6  Labs   CBC: Recent Labs  Lab 12/31/21 0945 01/01/22 0530 01/01/22 0539 01/02/22 0355 01/02/22 0535 01/02/22 1150 01/02/22 1701 01/03/22 0344  WBC 7.2 21.3*  --  16.5*  --   --  14.3* 13.3*  NEUTROABS 6.6 19.7*  --  15.3*  --   --  13.1* 12.2*  HGB  12.3* 13.6   < > 10.4* 9.5* 10.5* 10.6* 11.7*  HCT 34.8* 38.3*   < > 29.1* 28.0* 31.0* 30.6* 34.2*  MCV 92.3 91.2  --  93.6  --   --  95.0 95.3  PLT 225 269  --  208  --   --  194 211   < > = values in this interval not displayed.     Basic Metabolic Panel: Recent Labs  Lab 12/31/21 0945 01/01/22 0530 01/01/22 0539 01/01/22 1808 01/02/22 0355 01/02/22 0535 01/02/22 1150 01/02/22 1701 01/03/22 0344  NA 134* 136   < > 136 138 134* 135  --  134*  K 4.1 2.8*   < > 4.7  4.8 4.3 4.2 4.8  --  5.2*  CL 102 99  --  103 103  --   --   --  101  CO2 22 28  --  28 28  --   --   --  28  GLUCOSE 203* 243*  --  174* 175*  --   --   --  222*  BUN 7* 12  --  16 22  --    --   --  24*  CREATININE 0.84 0.93  --  1.02 0.89  --   --   --  0.82  CALCIUM 8.5* 8.0*  --  7.9* 8.0*  --   --   --  8.2*  MG 1.9 1.8  --  2.4 2.4  --   --  2.3 2.4  PHOS 1.8* 4.7*  --  2.4* 3.0  --   --  2.7 2.8   < > = values in this interval not displayed.    GFR: Estimated Creatinine Clearance: 63.7 mL/min (by C-G formula based on SCr of 0.82 mg/dL). Recent Labs  Lab 12/30/21 2233 12/30/21 2235 12/31/21 0503 12/31/21 0945 01/01/22 0530 01/01/22 1800 01/01/22 2035 01/02/22 0355 01/02/22 1701 01/03/22 0344  PROCALCITON  --  0.99  --   --   --   --   --   --   --   --   WBC  --  10.5  --    < > 21.3*  --   --  16.5* 14.3* 13.3*  LATICACIDVEN 1.9  --  1.2  --   --  1.9 1.8  --   --   --    < > = values in this interval not displayed.     Liver Function Tests: Recent Labs  Lab 12/30/21 2235 12/31/21 0945 01/01/22 0530 01/02/22 0355 01/03/22 0344  AST 37 28 46* 39 19  ALT 28 25 42 39 34  ALKPHOS 57 58 90 66 73  BILITOT 0.6 0.3 0.6 0.2* 0.4  PROT 5.8* 5.7* 5.8* 4.6* 4.9*  ALBUMIN 2.4* 2.1* 2.1* 1.7* 1.9*    No results for input(s): "LIPASE", "AMYLASE" in the last 168 hours. No results for input(s): "AMMONIA" in the last 168 hours.  ABG    Component Value Date/Time   PHART 7.366 01/02/2022 1150   PCO2ART 52.8 (H) 01/02/2022 1150   PO2ART 124 (H) 01/02/2022 1150   HCO3 30.5 (H) 01/02/2022 1150   TCO2 32 01/02/2022 1150   O2SAT 99 01/02/2022 1150     Coagulation Profile: Recent Labs  Lab 12/30/21 2235  INR 1.2     Cardiac Enzymes: No results for input(s): "CKTOTAL", "CKMB", "CKMBINDEX", "TROPONINI" in the last 168 hours.  HbA1C: Hgb A1c MFr Bld  Date/Time Value Ref Range Status  01/01/2022 05:30 AM 5.8 (H) 4.8 - 5.6 % Final    Comment:    (NOTE) Pre diabetes:          5.7%-6.4%  Diabetes:              >6.4%  Glycemic control for   <7.0% adults with diabetes     CBG: Recent Labs  Lab 01/02/22 1406 01/02/22 1507 01/02/22 1924  01/02/22 2324 01/03/22 0343  GLUCAP 213* 199* 203* 221* 212*     Review of Systems:   Intubated and sedated  Past Medical History:  He,  has a past medical history of Back pain, Elevated PSA, Essential tremor, Hypercholesteremia, Neuropathy, Prostate cancer ([place]), and Sinus tachycardia (01/30/2018).   Surgical History:   Past Surgical History:  Procedure Laterality Date   COLONOSCOPY WITH PROPOFOL N/A 08/10/2021   Procedure: COLONOSCOPY WITH PROPOFOL;  Surgeon: Carol Ada, MD;  Location: [facility];  Service: Gastroenterology;  Laterality: N/A;   ESOPHAGEAL DILATION  08/10/2021   Procedure: ESOPHAGEAL DILATION;  Surgeon: Carol Ada, MD;  Location: [facility];  Service: Gastroenterology;;   ESOPHAGOGASTRODUODENOSCOPY (EGD) WITH PROPOFOL N/A 08/10/2021   Procedure: ESOPHAGOGASTRODUODENOSCOPY (EGD) WITH PROPOFOL;  Surgeon: Carol Ada, MD;  Location: [facility];  Service: Gastroenterology;  Laterality: N/A;   HEMOSTASIS CLIP PLACEMENT  08/10/2021   Procedure: HEMOSTASIS CLIP PLACEMENT;  Surgeon: Carol Ada, MD;  Location: [facility];  Service: Gastroenterology;;   LUMBAR MICRODISCECTOMY     L4-5   POLYPECTOMY  08/10/2021   Procedure: POLYPECTOMY;  Surgeon: Carol Ada, MD;  Location: [facility];  Service: Gastroenterology;;   RADIOACTIVE SEED IMPLANT N/A 02/24/2018   Procedure: RADIOACTIVE SEED IMPLANT/BRACHYTHERAPY IMPLANT;  Surgeon: Festus Aloe, MD;  Location[facility];  Service: Urology;  Laterality: N/A;   RIGHT HEART CATH N/A 11/22/2021   Procedure: RIGHT HEART CATH;  Surgeon: Troy Sine, MD;  Location: [facility];  Service: Cardiovascular;  Laterality: N/A;   SPACE OAR INSTILLATION N/A 02/24/2018   Procedure: SPACE OAR INSTILLATION;  Surgeon: Festus Aloe, MD;  Location: [facility];  Service: Urology;  Laterality: N/A;     Social History:   reports that he has never smoked. He has never used smokeless  tobacco. He reports that he does not drink alcohol and does not use drugs.   Family History:  His family history includes Other in his father and mother. There is no history of Cancer.   Allergies Allergies  Allergen Reactions   Meat Extract     vegetarian   Ofev [Nintedanib] Other (See Comments)    Weight loss     Home Medications  Prior to Admission medications   Medication Sig Start Date End Date Taking? Authorizing Provider  aspirin EC 81 MG tablet Take 81 mg by mouth daily. Swallow whole.   Yes [provider]  metoprolol succinate (TOPROL XL) 25 MG 24 hr tablet Take 1 tablet (25 mg total) by mouth daily. 02/05/21  Yes Troy Sine, MD  pantoprazole (PROTONIX) 40 MG tablet Take 40 mg by mouth daily as needed (heartburn). 12/16/19  Yes [provider]  Pirfenidone (ESBRIET) 267 MG TABS Take 2 tablets (534 mg total) by mouth in the morning, at noon, and at bedtime. **LOW DOSE AS MAINTENANCE** Patient taking differently: Take 267 mg by mouth in the morning, at noon, and at bedtime. **LOW DOSE AS MAINTENANCE** 10/25/21  Yes Brand Males, MD  rosuvastatin (CRESTOR) 10 MG tablet Take 10 mg by mouth at  bedtime. 10/04/21  Yes [provider]     Critical care time:

## 2022-01-04 ENCOUNTER — Inpatient Hospital Stay (HOSPITAL_COMMUNITY): Payer: Medicare Other

## 2022-01-04 ENCOUNTER — Telehealth: Payer: Self-pay | Admitting: Internal Medicine

## 2022-01-04 DIAGNOSIS — J9601 Acute respiratory failure with hypoxia: Secondary | ICD-10-CM | POA: Diagnosis not present

## 2022-01-04 LAB — COMPREHENSIVE METABOLIC PANEL
ALT: 50 U/L — ABNORMAL HIGH (ref 0–44)
AST: 36 U/L (ref 15–41)
Albumin: 1.8 g/dL — ABNORMAL LOW (ref 3.5–5.0)
Alkaline Phosphatase: 60 U/L (ref 38–126)
Anion gap: 8 (ref 5–15)
BUN: 31 mg/dL — ABNORMAL HIGH (ref 8–23)
CO2: 28 mmol/L (ref 22–32)
Calcium: 8 mg/dL — ABNORMAL LOW (ref 8.9–10.3)
Chloride: 104 mmol/L (ref 98–111)
Creatinine, Ser: 0.83 mg/dL (ref 0.61–1.24)
GFR, Estimated: >60 mL/min (ref >60)
Glucose, Bld: 90 mg/dL (ref 70–99)
Potassium: 4.7 mmol/L (ref 3.5–5.1)
Sodium: 140 mmol/L (ref 135–145)
Total Bilirubin: 0.5 mg/dL (ref 0.3–1.2)
Total Protein: 4.5 g/dL — ABNORMAL LOW (ref 6.5–8.1)

## 2022-01-04 LAB — GLUCOSE, CAPILLARY
Glucose-Capillary: 98 mg/dL (ref 70–99)
Glucose-Capillary: 178 mg/dL — ABNORMAL HIGH (ref 70–99)
Glucose-Capillary: 202 mg/dL — ABNORMAL HIGH (ref 70–99)
Glucose-Capillary: 142 mg/dL — ABNORMAL HIGH (ref 70–99)
Glucose-Capillary: 153 mg/dL — ABNORMAL HIGH (ref 70–99)
Glucose-Capillary: 164 mg/dL — ABNORMAL HIGH (ref 70–99)

## 2022-01-04 LAB — LACTATE DEHYDROGENASE: LDH: 220 U/L — ABNORMAL HIGH (ref 98–192)

## 2022-01-04 LAB — PHOSPHORUS: Phosphorus: 2.5 mg/dL (ref 2.5–4.6)

## 2022-01-04 LAB — CBC WITH DIFFERENTIAL/PLATELET
Abs Immature Granulocytes: 0.21 10*3/uL — ABNORMAL HIGH (ref 0.00–0.07)
Basophils Absolute: 0 10*3/uL (ref 0.0–0.1)
Basophils Relative: 0 %
Eosinophils Absolute: 0 10*3/uL (ref 0.0–0.5)
Eosinophils Relative: 0 %
HCT: 31 % — ABNORMAL LOW (ref 39.0–52.0)
Hemoglobin: 10.8 g/dL — ABNORMAL LOW (ref 13.0–17.0)
Immature Granulocytes: 2 %
Lymphocytes Relative: 6 %
Lymphs Abs: 0.8 10*3/uL (ref 0.7–4.0)
MCH: 33 pg (ref 26.0–34.0)
MCHC: 34.8 g/dL (ref 30.0–36.0)
MCV: 94.8 fL (ref 80.0–100.0)
Monocytes Absolute: 0.7 10*3/uL (ref 0.1–1.0)
Monocytes Relative: 5 %
Neutro Abs: 11.7 10*3/uL — ABNORMAL HIGH (ref 1.7–7.7)
Neutrophils Relative %: 87 %
Platelets: 208 10*3/uL (ref 150–400)
RBC: 3.27 MIL/uL — ABNORMAL LOW (ref 4.22–5.81)
RDW: 12.9 % (ref 11.5–15.5)
WBC: 13.5 10*3/uL — ABNORMAL HIGH (ref 4.0–10.5)
nRBC: 0 % (ref 0.0–0.2)

## 2022-01-04 LAB — CULTURE, BLOOD (ROUTINE X 2)
Culture: NO GROWTH
Culture: NO GROWTH
Special Requests: ADEQUATE
Special Requests: ADEQUATE

## 2022-01-04 LAB — SEDIMENTATION RATE: Sed Rate: 20 mm/hr — ABNORMAL HIGH (ref 0–16)

## 2022-01-04 LAB — D-DIMER, QUANTITATIVE: D-Dimer, Quant: 3.92 ug/mL-FEU — ABNORMAL HIGH (ref 0.00–0.50)

## 2022-01-04 LAB — MAGNESIUM: Magnesium: 2.3 mg/dL (ref 1.7–2.4)

## 2022-01-04 LAB — C-REACTIVE PROTEIN: CRP: 2.1 mg/dL — ABNORMAL HIGH (ref <1.0)

## 2022-01-04 LAB — FERRITIN: Ferritin: 79 ng/mL (ref 24–336)

## 2022-01-04 LAB — TRIGLYCERIDES: Triglycerides: 36 mg/dL (ref <150)

## 2022-01-04 MED ORDER — ORAL CARE MOUTH RINSE: 15.0000 mL | OROMUCOSAL | Status: DC | PRN

## 2022-01-04 MED ORDER — SENNA 8.6 MG PO TABS
2.0000 | ORAL_TABLET | Freq: Every day | ORAL | Status: DC
  Administered 2022-01-05: 17.2 mg
  Filled 2022-01-04: qty 2

## 2022-01-04 MED ORDER — MIDAZOLAM HCL 2 MG/2ML IJ SOLN
0.5000 mg | Freq: Once | INTRAMUSCULAR | Status: AC
Start: 2022-01-04 — End: 2022-01-04
  Administered 2022-01-04: 0.5 mg via INTRAVENOUS
  Filled 2022-01-04: qty 2

## 2022-01-04 MED ORDER — LABETALOL HCL 5 MG/ML IV SOLN
5.0000 mg | Freq: Four times a day (QID) | INTRAVENOUS | Status: DC | PRN
  Administered 2022-01-04: 5 mg via INTRAVENOUS
  Filled 2022-01-04: qty 4

## 2022-01-04 MED ORDER — HYDRALAZINE HCL 20 MG/ML IJ SOLN
10.0000 mg | Freq: Four times a day (QID) | INTRAMUSCULAR | Status: DC | PRN
  Administered 2022-01-04 – 2022-01-05 (×2): 10 mg via INTRAVENOUS
  Filled 2022-01-04 (×2): qty 1

## 2022-01-04 MED ORDER — FUROSEMIDE 10 MG/ML IJ SOLN
40.0000 mg | Freq: Once | INTRAMUSCULAR | Status: AC
  Administered 2022-01-04: 40 mg via INTRAVENOUS
  Filled 2022-01-04: qty 4

## 2022-01-04 MED ORDER — LORAZEPAM 2 MG/ML IJ SOLN
0.5000 mg | Freq: Once | INTRAMUSCULAR | Status: AC
  Administered 2022-01-04: 0.5 mg via INTRAVENOUS
  Filled 2022-01-04: qty 1

## 2022-01-04 MED ORDER — INSULIN ASPART 100 UNIT/ML IJ SOLN
0.0000 [IU] | INTRAMUSCULAR | Status: DC
  Administered 2022-01-04: 1 [IU] via SUBCUTANEOUS
  Administered 2022-01-04 (×2): 2 [IU] via SUBCUTANEOUS
  Administered 2022-01-05: 1 [IU] via SUBCUTANEOUS
  Administered 2022-01-05: 2 [IU] via SUBCUTANEOUS
  Administered 2022-01-05: 1 [IU] via SUBCUTANEOUS

## 2022-01-04 MED ORDER — METOPROLOL TARTRATE 12.5 MG HALF TABLET
12.5000 mg | ORAL_TABLET | Freq: Two times a day (BID) | ORAL | Status: DC
  Administered 2022-01-04 – 2022-01-05 (×3): 12.5 mg
  Filled 2022-01-04 (×3): qty 1

## 2022-01-04 MED ORDER — DEXAMETHASONE 6 MG PO TABS
6.0000 mg | ORAL_TABLET | Freq: Every day | ORAL | Status: DC
  Administered 2022-01-04 – 2022-01-08 (×5): 6 mg via ORAL
  Filled 2022-01-04: qty 2
  Filled 2022-01-04: qty 1
  Filled 2022-01-04: qty 2
  Filled 2022-01-04 (×2): qty 1

## 2022-01-04 MED ORDER — LIDOCAINE 5 % EX PTCH
1.0000 | MEDICATED_PATCH | CUTANEOUS | Status: DC
  Administered 2022-01-04 – 2022-01-17 (×14): 1 via TRANSDERMAL
  Filled 2022-01-04 (×14): qty 1

## 2022-01-04 MED ORDER — LABETALOL HCL 5 MG/ML IV SOLN
5.0000 mg | Freq: Once | INTRAVENOUS | Status: AC
  Administered 2022-01-04: 5 mg via INTRAVENOUS
  Filled 2022-01-04: qty 4

## 2022-01-04 MED ORDER — PANTOPRAZOLE SODIUM 40 MG IV SOLR
40.0000 mg | Freq: Every day | INTRAVENOUS | Status: DC
  Administered 2022-01-05: 40 mg via INTRAVENOUS
  Filled 2022-01-04: qty 10

## 2022-01-04 NOTE — Inpatient Diabetes Management (Signed)
Inpatient Diabetes Program Recommendations  AACE/ADA: New Consensus Statement on Inpatient Glycemic Control (2015)  Target Ranges:  Prepandial:   less than 140 mg/dL      Peak postprandial:   less than 180 mg/dL (1-2 hours)      Critically ill patients:  140 - 180 mg/dL   Lab Results  Component Value Date   GLUCAP 202 (H) 01/04/2022   HGBA1C 5.8 (H) 01/01/2022    Review of Glycemic Control  Latest Reference Range & Units 01/03/22 23:25 01/04/22 03:02 01/04/22 07:46 01/04/22 12:33  Glucose-Capillary 70 - 99 mg/dL 201 (H) 98 178 (H) 202 (H)  (H): Data is abnormally high  Current orders for Inpatient glycemic control: Levemir 6 units BID Decadron 6 mg QD  Inpatient Diabetes Program Recommendations:    Consider adding Novolog 2-6 units Q4H. Secure chat sent to MD.  Thanks, Bronson Curb, MSN, RNC-OB Diabetes Coordinator [phone] (8a-5p)

## 2022-01-04 NOTE — Progress Notes (Signed)
Pt placed on PS/CPAP 8/5 on 30% and is tolerating well. RT will monitor.

## 2022-01-04 NOTE — Procedures (Signed)
Extubation Procedure Note  Patient Details:   Name: TAITUM ALMS DOB: 02-Jul-1940 MRN: [id number]   Airway Documentation:    Vent end date: 01/04/22 Vent end time: 1035   Evaluation  O2 sats: transiently fell during during procedure Complications: No apparent complications Patient did tolerate procedure well. Bilateral Breath Sounds: Clear, Diminished   Patient extubated per MD order & placed on 6L [place] to maintain SpO2. Patient is able to speak & has a good cough. Will continue to monitor patient for respiratory distress.  Kathie Dike 01/04/2022, 10:45 AM

## 2022-01-04 NOTE — Progress Notes (Signed)
CBGs above goal 140-180 mg/dL: No  Current regimen (include units of SSI in last 24hr): resistant sliding scale (required 45u SSI/24h)  Lispro 4 Q4H  Detemir 6 Q12H  Medications affecting CBG levels: Steroids, transitioned to po dexamethasone '6mg'$   Plan: TF stable Continue detemir 6 u Q12H and lispro 4 u Q4H (TDD: 36) Discussed with diabetic coordinator

## 2022-01-04 NOTE — Progress Notes (Signed)
[facility] Progress Note Patient Name: Jeff Willis DOB: Nov 02, 1940 MRN: [id number]   Date of Service  01/04/2022  HPI/Events of Note  Patient c/o anxiety. Given Ativan earlier which did not work well. Sat = 100% on HFNC and RR = 22.   eICU Interventions  Plan: Versed 0.5 mg IV X 1.     Intervention Category Major Interventions: Delirium, psychosis, severe agitation - evaluation and management  Enoc Getter Eugene 01/04/2022, 9:54 PM

## 2022-01-04 NOTE — Progress Notes (Signed)
NAME:  Jeff Willis, MRN:  [id number], DOB:  [birth year], LOS: 4 ADMISSION DATE:  12/30/2021, CONSULTATION DATE:  12/31/21 REFERRING MD:  Dr. Jennette Kettle, CHIEF COMPLAINT:  IPF patient with acute Covid-19   History of Present Illness:  Jeff Willis -has known to have esophageal stenosis status post dilatation April 2023 by Dr. Saralyn Pilar on.  IPF on esbreit (FVC 84% and DLCO 45% - may 2023) on esbreit. On room air. Noral RHC 11/22/21.  Follows with Dr Chase Caller. HRCT 821'@3'$  with worsening.  Flew to [place] for a cruise.  On 12/24/2021 developed fever and cough.  On 12/26/2021 and the cruciate was diagnosed with COVID 19 acute infection.  Was offered antiviral Paxlovid but he refused because of negative experience previously with side effects.  He continued to have fever cough and failure to thrive, low appetite and being bedbound.  He returned to [place] via flight on 12/30/2021 and at home was continuing to cough.  Pulse ox check at home showed hypoxemia and therefore was brought to the emergency department via EMS.  Here he is resting on 3 L nasal cannula with stable work of breathing.  COVID PCR is positive.  CT chest with contrast [none PE protocol but no big PE noted.  He has left greater than right diffuse groundglass opacities.  Pertinent  Medical History  IPF on esbriet, UIP, esophageal stenosis s/p dilatation 2023 Back pain, Elevated PSA, Essential tremor, Hypercholesteremia, Neuropathy, Prostate cancer ([place]), and Sinus tachycardia (01/30/2018).   Reports that he has never smoked. He has never used smokeless tobacco.   Has a past surgical history that includes Lumbar microdiscectomy; Radioactive seed implant (N/A, 02/24/2018); SPACE OAR INSTILLATION (N/A, 02/24/2018); Esophagogastroduodenoscopy (egd) with propofol (N/A, 08/10/2021); Colonoscopy with propofol (N/A, 08/10/2021); Esophageal dilation (08/10/2021); polypectomy (08/10/2021); Hemostasis clip placement (08/10/2021); and RIGHT  HEART CATH (N/A, 11/22/2021).  Significant Hospital Events: Including procedures, antibiotic start and stop dates in addition to other pertinent events   12/30/2021 - admitted 9/4- BiPAP, transferred to ICU 9/5- Intubated 0518 d/t respiratory failure, A line placed, levophed [phone]  Interim History / Subjective:  01/01/2022 -  Dual virus infection - rhinovirus and covid. Had some hemoptysis -> lovenox held -> then Suddnt worsening respiatory distress over 45 minutes -> no furthe hemoptysis - RR 30s, look of fear, higher o2 need 100% needing BiPAP . Also, A Fib RVR .  Son at bedside.  CXR diffuse GGO/ILD infiltrates, Intubated at 0518, Arterial line placed. Low pressures/bradycardia > gave bolus of IV fluid > EKG low voltage > Echocardiogram ordered > Levophed needed from 1829-2041 9/6 fluid bolus for pressures, off pressors 9/7 AM some agitation / asynchrony with the vent - PRN versed given, switched to propofol from precedex for deeper sedation > propofol caused some hypotension > switched back to precedex   Objective   Blood pressure 103/68, pulse 61, temperature 97.6 F (36.4 C), temperature source Axillary, resp. rate (!) 28, height '5\' 6"'$  (1.676 m), weight 70.1 kg, SpO2 97 %. CVP:  [7 mmHg-9 mmHg] 9 mmHg  Vent Mode: PRVC FiO2 (%):  [30 %-40 %] 30 % Set Rate:  [28 bmp-32 bmp] 28 bmp Vt Set:  [380 mL-440 mL] 440 mL PEEP:  [5 cmH20-8 cmH20] 5 cmH20 Plateau Pressure:  [20 cmH20-24 cmH20] 24 cmH20   Intake/Output Summary (Last 24 hours) at 01/04/2022 0830 Last data filed at 01/04/2022 0700 Gross per 24 hour  Intake 2201.57 ml  Output 950 ml  Net 1251.57 ml  UOP 950 (0.6) Filed Weights   01/02/22 0407 01/03/22 0350 01/04/22 0500  Weight: 59 kg 62.7 kg 70.1 kg   Examination: General: Critically ill appearring, pupils equal and reactive, following verbal and tactile commands HENT: NCAT, ET and OG tube in place, MMM Lungs: CTAB in anterior lung fields, no retractions, synchronous with  vent, no w/r/c, some decreased breath sounds in LLL Cardiovascular:  RRR no m/r/g 2+ radial pulses Abdomen: Soft, nontender, mildly distended, BS normoactive Extremities: Spontaneous movement, well perfused, trace pedal edema Neuro: Awake and responsive to commands on fentanyl and precedex GU:  external urinary catheter in place  Bcx no growth 4 days Na 140 from 138 K 4.7 from 4.9 Cr 0.83 from 0.90 LDH 220 from 254 Sed rate 20 from 20 D dimer 3.92 from 3.4 Ferritin 79 from 94 CRP 4.9 from 8.6 LA 1.8  WBC 13.5 from 13.3 Hgb 10.8 from 11.2  Resolved Hospital Problem list     Assessment & Plan:  IPF flare in setting of Covid-19 and Rhinovirus CXR similar to slightly improved from yesterday. Improved inflammatory markers. Will obtain PM ABG. Negative urine strep pneumo and legionella  - ARDSnet protocol - Monitor hemoptysis-continue lovenox as high risk for DVT - Steroids for COVID - transitioning to dexamethasone from prednisone - Empiric CAP coverage completed yesterday - F/u QuantiFERON gold - SBT today - Lasix 40 IV x 1 - Sedation with fentanyl, precedex and prn versed > stop fentanyl-hopeful for extubation today  HypotensionBradycardia, resolved S/p levophed on 9/5. MAPS 80s this morning off of pressors/fluid boluses. Echo with RAP of 3. Now more hypertensive given labetalol x 1. -restarted lopressor 12.5 BID -monitor  Oliguria 0.6 ml/kg/hr today. Cr 0.89.  -monitor UOP -Lasix 40 x 1   Hyperkalemia,resolved -monitor  DM -rSSI -6 u BID levemir -4 u q 4h novolog  -alter per pharmacy protocol  Elevated d dimer likely secondary to COVID -No PE noted on CT with contrast on admission -LE duplex is negative  Elevated Troponin -Likely demand. Echo without wall motion abnormalities  Atrial tachycardia/A fib. episode  In normal sinus rhythm now, some sinus tachycardia intermittently ON -restart lopressor  Known esophageal dilatation April 2023 History of  dysphagia for solids and spices 01/01/22 - some blood in OG post intubation, no bleeding currently -N.p.o. -PPI IV BID -OG tube feeds  Mild to moderate hyponatremia, resolved - new Na 128 at admit. Likely due to dehdyration 140 today Hypophostaemia - 01/01/22; replaced Mild hypomagnesemia - 01/01/22 Hypokalemia-01/01/22  replete prn   Anemia of chronic disease - Present on Admit 10.8. Stable, no active bleeding/hemoptysis -monitor while on lovenox   Mild protein calorie malnutrition due to acute illness - Present on Admit -tube feeds  Failure to thrive due to acute illness and weight loss from IPF - Present on Admit Plan  - monitor  Best Practice (right click and "Reselect all SmartList Selections" daily)   Diet/type: tubefeeds DVT prophylaxis: lovenox GI prophylaxis: PPI Lines: Arterial Line, PICC line Foley:  No urinary cath Code Status:  limited Last date of multidisciplinary goals of care discussion 9/6  Labs   CBC: Recent Labs  Lab 01/01/22 0530 01/01/22 0539 01/02/22 0355 01/02/22 0535 01/02/22 1150 01/02/22 1701 01/03/22 0344 01/03/22 1525 01/04/22 0320  WBC 21.3*  --  16.5*  --   --  14.3* 13.3*  --  13.5*  NEUTROABS 19.7*  --  15.3*  --   --  13.1* 12.2*  --  11.7*  HGB 13.6   < >  10.4*   < > 10.5* 10.6* 11.7* 11.2* 10.8*  HCT 38.3*   < > 29.1*   < > 31.0* 30.6* 34.2* 33.0* 31.0*  MCV 91.2  --  93.6  --   --  95.0 95.3  --  94.8  PLT 269  --  208  --   --  194 211  --  208   < > = values in this interval not displayed.     Basic Metabolic Panel: Recent Labs  Lab 01/01/22 1808 01/02/22 0355 01/02/22 0535 01/02/22 1150 01/02/22 1701 01/03/22 0344 01/03/22 1245 01/03/22 1525 01/04/22 0320  NA 136 138   < > 135  --  134* 137 138 140  K 4.7  4.8 4.3   < > 4.8  --  5.2* 4.8 4.9 4.7  CL 103 103  --   --   --  101 106  --  104  CO2 28 28  --   --   --  28 27  --  28  GLUCOSE 174* 175*  --   --   --  222* 203*  --  90  BUN 16 22  --   --   --  24* 29*   --  31*  CREATININE 1.02 0.89  --   --   --  0.82 0.90  --  0.83  CALCIUM 7.9* 8.0*  --   --   --  8.2* 8.0*  --  8.0*  MG 2.4 2.4  --   --  2.3 2.4  --   --  2.3  PHOS 2.4* 3.0  --   --  2.7 2.8  --   --  2.5   < > = values in this interval not displayed.    GFR: Estimated Creatinine Clearance: 64.1 mL/min (by C-G formula based on SCr of 0.83 mg/dL). Recent Labs  Lab 12/30/21 2233 12/30/21 2235 12/31/21 0503 12/31/21 0945 01/01/22 1800 01/01/22 2035 01/02/22 0355 01/02/22 1701 01/03/22 0344 01/04/22 0320  PROCALCITON  --  0.99  --   --   --   --   --   --   --   --   WBC  --  10.5  --    < >  --   --  16.5* 14.3* 13.3* 13.5*  LATICACIDVEN 1.9  --  1.2  --  1.9 1.8  --   --   --   --    < > = values in this interval not displayed.     Liver Function Tests: Recent Labs  Lab 12/31/21 0945 01/01/22 0530 01/02/22 0355 01/03/22 0344 01/04/22 0320  AST 28 46* 39 19 36  ALT 25 42 39 34 50*  ALKPHOS 58 90 66 73 60  BILITOT 0.3 0.6 0.2* 0.4 0.5  PROT 5.7* 5.8* 4.6* 4.9* 4.5*  ALBUMIN 2.1* 2.1* 1.7* 1.9* 1.8*    No results for input(s): "LIPASE", "AMYLASE" in the last 168 hours. No results for input(s): "AMMONIA" in the last 168 hours.  ABG    Component Value Date/Time   PHART 7.428 01/03/2022 1525   PCO2ART 49.0 (H) 01/03/2022 1525   PO2ART 136 (H) 01/03/2022 1525   HCO3 32.5 (H) 01/03/2022 1525   TCO2 34 (H) 01/03/2022 1525   O2SAT 99 01/03/2022 1525     Coagulation Profile: Recent Labs  Lab 12/30/21 2235  INR 1.2     Cardiac Enzymes: No results for input(s): "CKTOTAL", "CKMB", "CKMBINDEX", "TROPONINI" in the last  168 hours.  HbA1C: Hgb A1c MFr Bld  Date/Time Value Ref Range Status  01/01/2022 05:30 AM 5.8 (H) 4.8 - 5.6 % Final    Comment:    (NOTE) Pre diabetes:          5.7%-6.4%  Diabetes:              >6.4%  Glycemic control for   <7.0% adults with diabetes     CBG: Recent Labs  Lab 01/03/22 1523 01/03/22 1834 01/03/22 2325  01/04/22 0302 01/04/22 0746  GLUCAP 124* 210* 201* 98 178*     Review of Systems:   Intubated and sedated  Past Medical History:  He,  has a past medical history of Back pain, Elevated PSA, Essential tremor, Hypercholesteremia, Neuropathy, Prostate cancer ([place]), and Sinus tachycardia (01/30/2018).   Surgical History:   Past Surgical History:  Procedure Laterality Date   COLONOSCOPY WITH PROPOFOL N/A 08/10/2021   Procedure: COLONOSCOPY WITH PROPOFOL;  Surgeon: Carol Ada, MD;  Location: [facility];  Service: Gastroenterology;  Laterality: N/A;   ESOPHAGEAL DILATION  08/10/2021   Procedure: ESOPHAGEAL DILATION;  Surgeon: Carol Ada, MD;  Location: [facility];  Service: Gastroenterology;;   ESOPHAGOGASTRODUODENOSCOPY (EGD) WITH PROPOFOL N/A 08/10/2021   Procedure: ESOPHAGOGASTRODUODENOSCOPY (EGD) WITH PROPOFOL;  Surgeon: Carol Ada, MD;  Location: [facility];  Service: Gastroenterology;  Laterality: N/A;   HEMOSTASIS CLIP PLACEMENT  08/10/2021   Procedure: HEMOSTASIS CLIP PLACEMENT;  Surgeon: Carol Ada, MD;  Location: [facility];  Service: Gastroenterology;;   LUMBAR MICRODISCECTOMY     L4-5   POLYPECTOMY  08/10/2021   Procedure: POLYPECTOMY;  Surgeon: Carol Ada, MD;  Location: [facility];  Service: Gastroenterology;;   RADIOACTIVE SEED IMPLANT N/A 02/24/2018   Procedure: RADIOACTIVE SEED IMPLANT/BRACHYTHERAPY IMPLANT;  Surgeon: Festus Aloe, MD;  Location: [facility];  Service: Urology;  Laterality: N/A;   RIGHT HEART CATH N/A 11/22/2021   Procedure: RIGHT HEART CATH;  Surgeon: Troy Sine, MD;  Location: [facility];  Service: Cardiovascular;  Laterality: N/A;   SPACE OAR INSTILLATION N/A 02/24/2018   Procedure: SPACE OAR INSTILLATION;  Surgeon: Festus Aloe, MD;  Location: [facility];  Service: Urology;  Laterality: N/A;     Social History:   reports that he has never smoked. He has never used smokeless  tobacco. He reports that he does not drink alcohol and does not use drugs.   Family History:  His family history includes Other in his father and mother. There is no history of Cancer.   Allergies Allergies  Allergen Reactions   Meat Extract     vegetarian   Ofev [Nintedanib] Other (See Comments)    Weight loss     Home Medications  Prior to Admission medications   Medication Sig Start Date End Date Taking? Authorizing Provider  aspirin EC 81 MG tablet Take 81 mg by mouth daily. Swallow whole.   Yes [provider]  metoprolol succinate (TOPROL XL) 25 MG 24 hr tablet Take 1 tablet (25 mg total) by mouth daily. 02/05/21  Yes Troy Sine, MD  pantoprazole (PROTONIX) 40 MG tablet Take 40 mg by mouth daily as needed (heartburn). 12/16/19  Yes [provider]  Pirfenidone (ESBRIET) 267 MG TABS Take 2 tablets (534 mg total) by mouth in the morning, at noon, and at bedtime. **LOW DOSE AS MAINTENANCE** Patient taking differently: Take 267 mg by mouth in the morning, at noon, and at bedtime. **LOW DOSE AS MAINTENANCE** 10/25/21  Yes  Brand Males, MD  rosuvastatin (CRESTOR) 10 MG tablet Take 10 mg by mouth at bedtime. 10/04/21  Yes [provider]     Critical care time:

## 2022-01-04 NOTE — Telephone Encounter (Signed)
Received a phone call from patient's niece, Jagu Rajpara, asking about FMLA paperwork dropped off in office.  I let her know I did not have it and she emailed it to me.  Patient was last seen in this office in May 2023.  I sent email back to [email or web address] letting her know Dr. Chase Caller will fill out the form after patient's 01/10/22 office appointment and that she will need to complete the portion at the top of the form with her name and employer information and send it back to me.   Also informed her about $29 fee.

## 2022-01-04 NOTE — Progress Notes (Signed)
Speech Language Pathology Treatment:    Patient Details Name: COREN SAGAN MRN: [id number] DOB: 06-23-1940 Today's Date: 01/04/2022 Time:  -     Order received for swallow assessment. Pt extubated 10:45 after 5 day intubation. Recommend giving her until tomorrow morning (more time post extubation) for a better outcome with swallow assessment. Will check back tomorrow.                            Houston Siren  01/04/2022, 2:03 PM

## 2022-01-05 DIAGNOSIS — J9601 Acute respiratory failure with hypoxia: Secondary | ICD-10-CM | POA: Diagnosis not present

## 2022-01-05 LAB — GLUCOSE, CAPILLARY
Glucose-Capillary: 124 mg/dL — ABNORMAL HIGH (ref 70–99)
Glucose-Capillary: 135 mg/dL — ABNORMAL HIGH (ref 70–99)
Glucose-Capillary: 137 mg/dL — ABNORMAL HIGH (ref 70–99)
Glucose-Capillary: 156 mg/dL — ABNORMAL HIGH (ref 70–99)
Glucose-Capillary: 158 mg/dL — ABNORMAL HIGH (ref 70–99)

## 2022-01-05 LAB — COMPREHENSIVE METABOLIC PANEL
ALT: 56 U/L — ABNORMAL HIGH (ref 0–44)
AST: 38 U/L (ref 15–41)
Albumin: 2.5 g/dL — ABNORMAL LOW (ref 3.5–5.0)
Alkaline Phosphatase: 68 U/L (ref 38–126)
Anion gap: 11 (ref 5–15)
BUN: 45 mg/dL — ABNORMAL HIGH (ref 8–23)
CO2: 26 mmol/L (ref 22–32)
Calcium: 8.5 mg/dL — ABNORMAL LOW (ref 8.9–10.3)
Chloride: 102 mmol/L (ref 98–111)
Creatinine, Ser: 1.22 mg/dL (ref 0.61–1.24)
GFR, Estimated: 60 mL/min — ABNORMAL LOW (ref >60)
Glucose, Bld: 151 mg/dL — ABNORMAL HIGH (ref 70–99)
Potassium: 4.3 mmol/L (ref 3.5–5.1)
Sodium: 139 mmol/L (ref 135–145)
Total Bilirubin: 0.8 mg/dL (ref 0.3–1.2)
Total Protein: 5.6 g/dL — ABNORMAL LOW (ref 6.5–8.1)

## 2022-01-05 LAB — CBC WITH DIFFERENTIAL/PLATELET
Abs Immature Granulocytes: 0.44 10*3/uL — ABNORMAL HIGH (ref 0.00–0.07)
Basophils Absolute: 0 10*3/uL (ref 0.0–0.1)
Basophils Relative: 0 %
Eosinophils Absolute: 0 10*3/uL (ref 0.0–0.5)
Eosinophils Relative: 0 %
HCT: 38.2 % — ABNORMAL LOW (ref 39.0–52.0)
Hemoglobin: 13.4 g/dL (ref 13.0–17.0)
Immature Granulocytes: 3 %
Lymphocytes Relative: 8 %
Lymphs Abs: 1.4 10*3/uL (ref 0.7–4.0)
MCH: 32.9 pg (ref 26.0–34.0)
MCHC: 35.1 g/dL (ref 30.0–36.0)
MCV: 93.9 fL (ref 80.0–100.0)
Monocytes Absolute: 1.1 10*3/uL — ABNORMAL HIGH (ref 0.1–1.0)
Monocytes Relative: 7 %
Neutro Abs: 13.8 10*3/uL — ABNORMAL HIGH (ref 1.7–7.7)
Neutrophils Relative %: 82 %
Platelets: 298 10*3/uL (ref 150–400)
RBC: 4.07 MIL/uL — ABNORMAL LOW (ref 4.22–5.81)
RDW: 13.2 % (ref 11.5–15.5)
WBC: 16.8 10*3/uL — ABNORMAL HIGH (ref 4.0–10.5)
nRBC: 0 % (ref 0.0–0.2)

## 2022-01-05 LAB — FERRITIN: Ferritin: 114 ng/mL (ref 24–336)

## 2022-01-05 LAB — LACTATE DEHYDROGENASE: LDH: 348 U/L — ABNORMAL HIGH (ref 98–192)

## 2022-01-05 LAB — SEDIMENTATION RATE: Sed Rate: 16 mm/hr (ref 0–16)

## 2022-01-05 LAB — C-REACTIVE PROTEIN: CRP: 1.5 mg/dL — ABNORMAL HIGH (ref <1.0)

## 2022-01-05 LAB — D-DIMER, QUANTITATIVE: D-Dimer, Quant: 8.16 ug/mL-FEU — ABNORMAL HIGH (ref 0.00–0.50)

## 2022-01-05 LAB — PHOSPHORUS: Phosphorus: 4.8 mg/dL — ABNORMAL HIGH (ref 2.5–4.6)

## 2022-01-05 LAB — MAGNESIUM: Magnesium: 2.6 mg/dL — ABNORMAL HIGH (ref 1.7–2.4)

## 2022-01-05 MED ORDER — FENTANYL CITRATE PF 50 MCG/ML IJ SOSY
12.5000 ug | PREFILLED_SYRINGE | Freq: Once | INTRAMUSCULAR | Status: AC
  Administered 2022-01-05: 12.5 ug via INTRAVENOUS
  Filled 2022-01-05: qty 1

## 2022-01-05 MED ORDER — ZINC SULFATE 220 (50 ZN) MG PO CAPS
220.0000 mg | ORAL_CAPSULE | Freq: Every day | ORAL | Status: DC
  Administered 2022-01-06 – 2022-01-12 (×7): 220 mg via ORAL
  Filled 2022-01-05 (×7): qty 1

## 2022-01-05 MED ORDER — INSULIN DETEMIR 100 UNIT/ML ~~LOC~~ SOLN
6.0000 [IU] | Freq: Two times a day (BID) | SUBCUTANEOUS | Status: DC
  Administered 2022-01-06 – 2022-01-15 (×15): 6 [IU] via SUBCUTANEOUS
  Filled 2022-01-05 (×21): qty 0.06

## 2022-01-05 MED ORDER — FUROSEMIDE 10 MG/ML IJ SOLN
40.0000 mg | Freq: Once | INTRAMUSCULAR | Status: AC
  Administered 2022-01-05: 40 mg via INTRAVENOUS
  Filled 2022-01-05: qty 4

## 2022-01-05 MED ORDER — SENNA 8.6 MG PO TABS
2.0000 | ORAL_TABLET | Freq: Every day | ORAL | Status: DC
Start: 2022-01-06 — End: 2022-01-18
  Administered 2022-01-06 – 2022-01-13 (×7): 17.2 mg via ORAL
  Filled 2022-01-05 (×11): qty 2

## 2022-01-05 MED ORDER — CYCLOBENZAPRINE HCL 5 MG PO TABS
5.0000 mg | ORAL_TABLET | Freq: Two times a day (BID) | ORAL | Status: DC | PRN
  Administered 2022-01-05 – 2022-01-07 (×3): 5 mg via ORAL
  Filled 2022-01-05 (×3): qty 1

## 2022-01-05 MED ORDER — VITAMIN C 500 MG PO TABS
500.0000 mg | ORAL_TABLET | Freq: Every day | ORAL | Status: DC
  Administered 2022-01-06 – 2022-01-12 (×7): 500 mg via ORAL
  Filled 2022-01-05 (×7): qty 1

## 2022-01-05 MED ORDER — ASPIRIN 81 MG PO CHEW
81.0000 mg | CHEWABLE_TABLET | Freq: Every day | ORAL | Status: DC
  Administered 2022-01-06 – 2022-01-18 (×13): 81 mg via ORAL
  Filled 2022-01-05 (×13): qty 1

## 2022-01-05 MED ORDER — ROSUVASTATIN CALCIUM 5 MG PO TABS
10.0000 mg | ORAL_TABLET | Freq: Every day | ORAL | Status: DC
  Administered 2022-01-06 – 2022-01-17 (×13): 10 mg via ORAL
  Filled 2022-01-05: qty 2
  Filled 2022-01-05: qty 1
  Filled 2022-01-05 (×4): qty 2
  Filled 2022-01-05: qty 1
  Filled 2022-01-05 (×2): qty 2
  Filled 2022-01-05: qty 1
  Filled 2022-01-05 (×3): qty 2

## 2022-01-05 MED ORDER — HYDROCODONE-ACETAMINOPHEN 5-325 MG PO TABS
1.0000 | ORAL_TABLET | Freq: Four times a day (QID) | ORAL | Status: DC | PRN
  Administered 2022-01-05: 1 via ORAL
  Filled 2022-01-05: qty 1

## 2022-01-05 MED ORDER — INSULIN ASPART 100 UNIT/ML IJ SOLN
0.0000 [IU] | Freq: Three times a day (TID) | INTRAMUSCULAR | Status: DC
  Administered 2022-01-05: 1 [IU] via SUBCUTANEOUS
  Administered 2022-01-06 (×2): 2 [IU] via SUBCUTANEOUS
  Administered 2022-01-07: 3 [IU] via SUBCUTANEOUS
  Administered 2022-01-08 (×2): 1 [IU] via SUBCUTANEOUS
  Administered 2022-01-09 (×2): 2 [IU] via SUBCUTANEOUS
  Administered 2022-01-10: 3 [IU] via SUBCUTANEOUS
  Administered 2022-01-10: 1 [IU] via SUBCUTANEOUS

## 2022-01-05 MED ORDER — HYDROCODONE-ACETAMINOPHEN 5-325 MG PO TABS
1.0000 | ORAL_TABLET | ORAL | Status: DC | PRN
  Administered 2022-01-05 – 2022-01-06 (×5): 1 via ORAL
  Filled 2022-01-05 (×6): qty 1

## 2022-01-05 MED ORDER — METOPROLOL TARTRATE 12.5 MG HALF TABLET
12.5000 mg | ORAL_TABLET | Freq: Two times a day (BID) | ORAL | Status: DC
  Administered 2022-01-06 – 2022-01-18 (×25): 12.5 mg via ORAL
  Filled 2022-01-05 (×26): qty 1

## 2022-01-05 MED ORDER — PANTOPRAZOLE SODIUM 40 MG PO TBEC
40.0000 mg | DELAYED_RELEASE_TABLET | Freq: Every day | ORAL | Status: DC
  Administered 2022-01-06 – 2022-01-08 (×3): 40 mg via ORAL
  Filled 2022-01-05 (×3): qty 1

## 2022-01-05 MED ORDER — IBUPROFEN 200 MG PO TABS
200.0000 mg | ORAL_TABLET | Freq: Once | ORAL | Status: AC
Start: 2022-01-05 — End: 2022-01-05
  Administered 2022-01-05: 200 mg via ORAL
  Filled 2022-01-05: qty 1

## 2022-01-05 NOTE — Progress Notes (Signed)
Patient tolerated small sips of water fed by spoon and med crushed in apple sauce

## 2022-01-05 NOTE — Progress Notes (Signed)
[place] Progress Note Patient Name: Jeff Willis DOB: Jul 07, 1940 MRN: [id number]   Date of Service  01/05/2022  HPI/Events of Note  Patient still c/o 10/10 back pain. No relief with Tylenol, Motrin and Kpad.   eICU Interventions  Plan: Fentanyl 12.5 mcg IV X 1.      Intervention Category Major Interventions: Other:  Lysle Dingwall 01/05/2022, 2:34 AM

## 2022-01-05 NOTE — Progress Notes (Signed)
Inpatient Rehab Admissions Coordinator Note:   Per therayp patient was screened for CIR candidacy by Vayla Wilhelmi Danford Bad, CCC-SLP. At this time, pt is not medically ready for CIR. Pt requiring 12L HFNC. AC will not pursue a rehab consult for pt at this time. CIR admissions team will follow to monitor for medical readiness. Consult order will be placed if pt appears to be an appropriate candidate.    Gayland Curry, [place], [place] Admissions Coordinator [phone] 01/05/22 5:24 PM

## 2022-01-05 NOTE — Progress Notes (Signed)
Patient c/o back pain  No relief with tylenol notified E link order K pad

## 2022-01-05 NOTE — Progress Notes (Signed)
[place] Progress Note Patient Name: Jeff Willis DOB: 1941-01-05 MRN: [id number]   Date of Service  01/05/2022  HPI/Events of Note  Chronic back pain - No relief with Tylenol. Creatinine = 0.83. Nursing relates that the patient is able to handle meds with sips.   eICU Interventions  Plan: Kpad PRN. NPO except sips with meds. Motrin 200 mg PO X 1.      Intervention Category Major Interventions: Other:  Lysle Dingwall 01/05/2022, 12:57 AM

## 2022-01-05 NOTE — Progress Notes (Signed)
NAME:  Jeff Willis, MRN:  [id number], DOB:  04-06-1941, LOS: 5 ADMISSION DATE:  12/30/2021, CONSULTATION DATE:  12/31/21 REFERRING MD:  Dr. Jennette Kettle, CHIEF COMPLAINT:  IPF patient with acute Covid-19   History of Present Illness:  Jeff Willis -has known to have esophageal stenosis status post dilatation April 2023 by Dr. Saralyn Pilar on.  IPF on esbreit (FVC 84% and DLCO 45% - may 2023) on esbreit. On room air. Noral RHC 11/22/21.  Follows with Dr Chase Caller. HRCT 821'@3'$  with worsening.  Flew to [place] for a cruise.  On 12/24/2021 developed fever and cough.  On 12/26/2021 and the cruciate was diagnosed with COVID 19 acute infection.  Was offered antiviral Paxlovid but he refused because of negative experience previously with side effects.  He continued to have fever cough and failure to thrive, low appetite and being bedbound.  He returned to [place] via flight on 12/30/2021 and at home was continuing to cough.  Pulse ox check at home showed hypoxemia and therefore was brought to the emergency department via EMS.  Here he is resting on 3 L nasal cannula with stable work of breathing.  COVID PCR is positive.  CT chest with contrast [none PE protocol but no big PE noted.  He has left greater than right diffuse groundglass opacities.  Pertinent  Medical History  IPF on esbriet, UIP, esophageal stenosis s/p dilatation 2023 Back pain, Elevated PSA, Essential tremor, Hypercholesteremia, Neuropathy, Prostate cancer ([place]), and Sinus tachycardia (01/30/2018).   Reports that he has never smoked. He has never used smokeless tobacco.   Has a past surgical history that includes Lumbar microdiscectomy; Radioactive seed implant (N/A, 02/24/2018); SPACE OAR INSTILLATION (N/A, 02/24/2018); Esophagogastroduodenoscopy (egd) with propofol (N/A, 08/10/2021); Colonoscopy with propofol (N/A, 08/10/2021); Esophageal dilation (08/10/2021); polypectomy (08/10/2021); Hemostasis clip placement (08/10/2021); and RIGHT  HEART CATH (N/A, 11/22/2021).  Significant Hospital Events: Including procedures, antibiotic start and stop dates in addition to other pertinent events   12/30/2021 - admitted 9/4- BiPAP, transferred to ICU 9/5- Intubated 0518 d/t respiratory failure, A line placed, levophed [id number] 9/8 Extubated  Interim History / Subjective:  Extubated yesterday On heated high flow Son at bedside. Concerned for back pain   Objective   Blood pressure (!) 145/84, pulse 97, temperature 98.1 F (36.7 C), temperature source Oral, resp. rate (!) 25, height '5\' 6"'$  (1.676 m), weight 68.1 kg, SpO2 94 %. CVP:  [1 mmHg-5 mmHg] 1 mmHg  FiO2 (%):  [40 %-60 %] 40 %   Intake/Output Summary (Last 24 hours) at 01/05/2022 0941 Last data filed at 01/05/2022 0843 Gross per 24 hour  Intake 359.26 ml  Output 2225 ml  Net -1865.74 ml  UOP 950 (0.6) Filed Weights   01/03/22 0350 01/04/22 0500 01/05/22 0500  Weight: 62.7 kg 70.1 kg 68.1 kg   Physical Exam: General: Thin, frail-appearing, no acute distress HENT: [place], AT, OP clear, MMM, heated high flow nasal cannula Eyes: EOMI, no scleral icterus Respiratory: Clear to auscultation bilaterally.  No crackles, wheezing or rales Cardiovascular: RRR, -M/R/G, no JVD GI: BS+, soft, nontender Extremities:-Edema,-tenderness Neuro: AAO x4, CNII-XII grossly intact Skin: Intact, no rashes or bruising Psych: Normal mood, normal affect  CMET reviewed. Overall normal. Increased Cr 1.22  WBC 16.8  Resolved Hospital Problem list    HypotensionBradycardia, resolved Hyponatremia  Assessment & Plan:  IPF flare in setting of Covid-19 and Rhinovirus S/p CAP coverage. No PE noted on CT with contrast on admission. LE duplex is negative - Extubated yesterday -  Wean HHFNC for goal SpO2 >85% - Dexamethasone - Lasix 40 mg once  Atrial tachycardia/A fib. episode  In normal sinus rhythm  Hypertension Demand ischemia - Metoprolol 12.5 mg BID  DM - Levemir 6 BID, SSI -  Currently on steroids  Known esophageal dilatation April 2023 History of dysphagia for solids and spices 01/01/22 - some blood in OG post intubation, no bleeding currently -Swallow evaluation -PPI daily   Anemia of chronic disease - Present on Admit 10.8. Stable, no active bleeding/hemoptysis -monitor while on lovenox   Mild protein calorie malnutrition due to acute illness - Present on Admit Failure to thrive due to acute illness and weight loss from IPF - Present on Admit - Swallow eval for diet - monitor  Back pain Narcotic naive however pain remains uncontrolled -Start low dose norco after discussion with son Warehouse manager)  Building surveyor (right click and "Reselect all SmartList Selections" daily)   Diet/type: tubefeeds DVT prophylaxis: lovenox GI prophylaxis: PPI Lines: Arterial Line, PICC line Foley:  No urinary cath Code Status:  limited Last date of multidisciplinary goals of care discussion 9/6   Critical care time:     The patient is critically ill with respiratory failure 2/2 COVID pneumonia with high risk for deterioration and requires high complexity decision making for assessment and support, frequent evaluation and titration of therapies, application of advanced monitoring technologies and extensive interpretation of multiple databases.  Independent Critical Care Time: 30 Minutes.   Rodman Pickle, M.D. [facility] Pulmonary/Critical Care Medicine 01/05/2022 9:45 AM   Please see Amion for pager number to reach on-call Pulmonary and Critical Care Team.

## 2022-01-05 NOTE — Progress Notes (Signed)
Took pt off HHFNC and placed on [place] HFNC at 12L. VS within normal limits. Pt resting comfortably. No distress noted. Rt will continue to closely monitor pt. Family updated at bedside.

## 2022-01-05 NOTE — Evaluation (Signed)
Occupational Therapy Evaluation Patient Details Name: Jeff Willis MRN: [id number] DOB: [birth year] Today's Date: 01/05/2022   History of Present Illness 81 yo male admitted 9/3 with hypoxia, FTT after returning on flight from [place] same date. Pt developed cough 8/28 when flying to [place] for cruise with Covid + test 8/30. Intubation 9/5-9/8. PMhx: esophageal stenosis, PAD, GERD, spinal stenosis   Clinical Impression   Patient admitted for the diagnosis above.  Current status further complicated by severe low back pain.  Patient following commands, but not providing verbal responses.  Per the chart, was independent with ADL, iADL and mobility.  Currently limited by severe pain, needing +2 for safe mobility, and up to total A for ADL completion.  OT can follow along, and AIR has been recommended for an initial screen.  Patient's pain will need to be better managed to increase abilities.        Recommendations for follow up therapy are one component of a multi-disciplinary discharge planning process, led by the attending physician.  Recommendations may be updated based on patient status, additional functional criteria and insurance authorization.   Follow Up Recommendations  Acute inpatient rehab (3hours/day)    Assistance Recommended at Discharge Frequent or constant Supervision/Assistance  Patient can return home with the following A lot of help with bathing/dressing/bathroom;Two people to help with walking and/or transfers;Help with stairs or ramp for entrance;Assist for transportation;Assistance with cooking/housework    Functional Status Assessment  Patient has had a recent decline in their functional status and demonstrates the ability to make significant improvements in function in a reasonable and predictable amount of time.  Equipment Recommendations  Wheelchair (measurements OT);Wheelchair cushion (measurements OT)    Recommendations for Other Services       Precautions /  Restrictions Precautions Precautions: Fall Restrictions Weight Bearing Restrictions: No      Mobility Bed Mobility Overal bed mobility: Needs Assistance Bed Mobility: Sit to Sidelying         Sit to sidelying: Max assist      Transfers Overall transfer level: Needs assistance   Transfers: Sit to/from Stand, Bed to chair/wheelchair/BSC Sit to Stand: Mod assist     Step pivot transfers: Mod assist, Max assist            Balance Overall balance assessment: Needs assistance Sitting-balance support: Feet supported Sitting balance-Leahy Scale: Fair     Standing balance support: Reliant on assistive device for balance Standing balance-Leahy Scale: Zero Standing balance comment: unable to correct backward lean                           ADL either performed or assessed with clinical judgement   ADL Overall ADL's : Needs assistance/impaired     Grooming: Wash/dry hands;Wash/dry face;Moderate assistance;Sitting   Upper Body Bathing: Maximal assistance;Sitting   Lower Body Bathing: Total assistance;Bed level   Upper Body Dressing : Maximal assistance;Sitting   Lower Body Dressing: Total assistance;Bed level   Toilet Transfer: Moderate assistance;Stand-pivot;Rolling walker (2 wheels)   Toileting- Clothing Manipulation and Hygiene: Total assistance;Sit to/from stand               Vision   Vision Assessment?: No apparent visual deficits     Perception Perception Perception: Not tested   Praxis Praxis Praxis: Not tested    Pertinent Vitals/Pain Pain Assessment Pain Assessment: Faces Faces Pain Scale: Hurts whole lot Pain Location: low back Pain Descriptors / Indicators: Grimacing, Guarding, Sharp Pain Intervention(s): Monitored  during session, Patient requesting pain meds-RN notified     Hand Dominance Right   Extremity/Trunk Assessment Upper Extremity Assessment Upper Extremity Assessment: Generalized weakness   Lower Extremity  Assessment Lower Extremity Assessment: Defer to PT evaluation   Cervical / Trunk Assessment Cervical / Trunk Assessment: Normal   Communication Communication Communication: No difficulties   Cognition Arousal/Alertness: Awake/alert Behavior During Therapy: Flat affect Overall Cognitive Status: Difficult to assess                                 General Comments: following commands, but limited verbal responses.  Basically shook his head     General Comments  86 bpm, 97%, heated hFNC at 30% FiO2 with 25LO2, 137/83    Exercises     Shoulder Instructions      Home Living Family/patient expects to be discharged to:: Private residence Living Arrangements: Children;Spouse/significant other;Other relatives Available Help at Discharge: Family;Available 24 hours/day Type of Home: House Home Access: Stairs to enter [employer] of Steps: 2 Entrance Stairs-Rails: None Home Layout: Two level;Able to live on main level with bedroom/bathroom     Bathroom Shower/Tub: Tub/shower unit;Curtain   Biochemist, clinical: Standard Bathroom Accessibility: Yes How Accessible: Accessible via walker Home Equipment: Grab bars - tub/shower   Additional Comments: Just came back from cruise to [place]      Prior Functioning/Environment Prior Level of Function : Independent/Modified Independent;Driving             Mobility Comments: No issues and was independent. ADLs Comments: I with ADL, iADL        OT Problem List: Decreased strength;Decreased range of motion;Decreased activity tolerance;Impaired balance (sitting and/or standing);Pain      OT Treatment/Interventions: Self-care/ADL training;Therapeutic activities;DME and/or AE instruction;Balance training;Patient/family education    OT Goals(Current goals can be found in the care plan section) Acute Rehab OT Goals OT Goal Formulation: Patient unable to participate in goal setting Time For Goal Achievement:  01/18/22 Potential to Achieve Goals: Fair  OT Frequency: Min 2X/week    Co-evaluation              AM-PAC OT "6 Clicks" Daily Activity     Outcome Measure Help from another person eating meals?: A Lot Help from another person taking care of personal grooming?: A Lot Help from another person toileting, which includes using toliet, bedpan, or urinal?: A Lot Help from another person bathing (including washing, rinsing, drying)?: A Lot Help from another person to put on and taking off regular upper body clothing?: A Lot Help from another person to put on and taking off regular lower body clothing?: Total 6 Click Score: 11   End of Session Equipment Utilized During Treatment: Gait belt;Rolling walker (2 wheels) Nurse Communication: Mobility status  Activity Tolerance: Patient limited by pain Patient left: in bed;with call bell/phone within reach;with nursing/sitter in room;with family/visitor present  OT Visit Diagnosis: Unsteadiness on feet (R26.81)                Time: 1093-2355 OT Time Calculation (min): 19 min Charges:  OT General Charges $OT Visit: 1 Visit OT Evaluation $OT Eval Moderate Complexity: 1 Mod  01/05/2022  RP, OTR/L  Acute Rehabilitation Services  Office:  [phone]   Metta Clines 01/05/2022, 1:09 PM

## 2022-01-05 NOTE — Evaluation (Signed)
Physical Therapy Evaluation Patient Details Name: Jeff Willis MRN: [id number] DOB: [birth year] Today's Date: 01/05/2022  History of Present Illness  81 yo male admitted 9/3 with hypoxia, FTT after returning on flight from [place] same date. Pt developed cough 8/28 when flying to [place] for cruise with Covid + test 8/30. Intubation 9/5-9/8. PMhx: esophageal stenosis, PAD, GERD, spinal stenosis  Clinical Impression  Pt admitted with above diagnosis. Pt was able to sit EOB and be transferred OOB with mod assist of 2.  Desaturation to 85% on heated high flow [place] with transfer but recovered to 94% within 2 min.  Pt will most likely need AIR as he is very deconditioned.  HAs supportive family. Will follow acutely.  Pt currently with functional limitations due to the deficits listed below (see PT Problem List). Pt will benefit from skilled PT to increase their independence and safety with mobility to allow discharge to the venue listed below.          Recommendations for follow up therapy are one component of a multi-disciplinary discharge planning process, led by the attending physician.  Recommendations may be updated based on patient status, additional functional criteria and insurance authorization.  Follow Up Recommendations Acute inpatient rehab (3hours/day)      Assistance Recommended at Discharge Frequent or constant Supervision/Assistance  Patient can return home with the following  Two people to help with walking and/or transfers;A lot of help with bathing/dressing/bathroom;Assistance with cooking/housework;Assist for transportation;Help with stairs or ramp for entrance    Equipment Recommendations Rolling walker (2 wheels);BSC/3in1  Recommendations for Other Services  Rehab consult    Functional Status Assessment Patient has had a recent decline in their functional status and demonstrates the ability to make significant improvements in function in a reasonable and predictable amount  of time.     Precautions / Restrictions Precautions Precautions: Fall Restrictions Weight Bearing Restrictions: No      Mobility  Bed Mobility Overal bed mobility: Needs Assistance Bed Mobility: Rolling, Sidelying to Sit Rolling: Mod assist Sidelying to sit: Mod assist       General bed mobility comments: Pt needed cues for technique and assist for LEs off bed and for trunk elevation.    Transfers Overall transfer level: Needs assistance Equipment used: 2 person hand held assist Transfers: Sit to/from Stand, Bed to chair/wheelchair/BSC Sit to Stand: Mod assist, +2 physical assistance, From elevated surface   Step pivot transfers: Mod assist, +2 safety/equipment       General transfer comment: Pt needed mod assist to power up with posterior lean. Pt was able to step and pivot to chair with mod assist for weight shifting and support overall.  Desaturated to 85% once in chair and O2 took about 2 min to return to 94%    Ambulation/Gait                  Stairs            Wheelchair Mobility    Modified Rankin (Stroke Patients Only)       Balance Overall balance assessment: Needs assistance Sitting-balance support: Bilateral upper extremity supported, Feet supported Sitting balance-Leahy Scale: Poor Sitting balance - Comments: relies on min to mod assist and bil UEs due to posterior lean. Postural control: Posterior lean Standing balance support: Bilateral upper extremity supported, During functional activity Standing balance-Leahy Scale: Poor Standing balance comment: relies on UE support with posterior lean needing mod assist of 2  Pertinent Vitals/Pain Pain Assessment Pain Assessment: 0-10 Pain Score: 10-Worst pain ever Pain Location: low back Pain Descriptors / Indicators: Stabbing, Shooting Pain Intervention(s): Premedicated before session, Monitored during session, Limited activity within patient's  tolerance, Repositioned    Home Living Family/patient expects to be discharged to:: Private residence Living Arrangements: Children;Spouse/significant other;Other relatives (brother and sister in law, spouse and grandson) Available Help at Discharge: Family;Available 24 hours/day Type of Home: House Home Access: Stairs to enter Entrance Stairs-Rails: None Entrance Stairs-Number of Steps: 2 (another entrance with 10 steps and hand rail)   Home Layout: Two level;Able to live on main level with bedroom/bathroom Home Equipment: Grab bars - tub/shower Additional Comments: Just came back from cruise to [place]    Prior Function Prior Level of Function : Independent/Modified Independent;Driving             Mobility Comments: No issues and was independent. ADLs Comments: I with ADLS     Hand Dominance   Dominant Hand: Right    Extremity/Trunk Assessment   Upper Extremity Assessment Upper Extremity Assessment: Defer to OT evaluation    Lower Extremity Assessment Lower Extremity Assessment: Generalized weakness    Cervical / Trunk Assessment Cervical / Trunk Assessment: Normal  Communication   Communication: Prefers language other than [place] (From [place], fluent in [place])  Cognition Arousal/Alertness: Awake/alert Behavior During Therapy: Flat affect Overall Cognitive Status: Within Functional Limits for tasks assessed                                          General Comments General comments (skin integrity, edema, etc.): 86 bpm, 97%, heated hFNC at 30% FiO2 with 25LO2, 137/83    Exercises General Exercises - Lower Extremity Ankle Circles/Pumps: AROM, Both, 5 reps, Seated Long Arc Quad: AROM, Both, 5 reps, Seated   Assessment/Plan    PT Assessment Patient needs continued PT services  PT Problem List Decreased activity tolerance;Decreased balance;Decreased mobility;Decreased knowledge of use of DME;Decreased safety awareness;Decreased knowledge of  precautions;Cardiopulmonary status limiting activity;Pain       PT Treatment Interventions DME instruction;Gait training;Functional mobility training;Therapeutic activities;Therapeutic exercise;Balance training;Patient/family education;Stair training    PT Goals (Current goals can be found in the Care Plan section)  Acute Rehab PT Goals Patient Stated Goal: to go home PT Goal Formulation: With patient Time For Goal Achievement: 01/19/22 Potential to Achieve Goals: Good    Frequency Min 3X/week     Co-evaluation               AM-PAC PT "6 Clicks" Mobility  Outcome Measure Help needed turning from your back to your side while in a flat bed without using bedrails?: A Lot Help needed moving from lying on your back to sitting on the side of a flat bed without using bedrails?: Total Help needed moving to and from a bed to a chair (including a wheelchair)?: Total Help needed standing up from a chair using your arms (e.g., wheelchair or bedside chair)?: Total Help needed to walk in hospital room?: Total Help needed climbing 3-5 steps with a railing? : Total 6 Click Score: 7    End of Session Equipment Utilized During Treatment: Gait belt;Oxygen (heated high flow) Activity Tolerance: Patient limited by fatigue (desaturation) Patient left: in chair;with call bell/phone within reach;with chair alarm set;with family/visitor present Nurse Communication: Mobility status;Patient requests pain meds PT Visit Diagnosis: Unsteadiness on feet (R26.81);Muscle weakness (generalized) (  M62.81);Pain Pain - part of body:  (back)    Time: 5183-3582 PT Time Calculation (min) (ACUTE ONLY): 51 min   Charges:   PT Evaluation $PT Eval Moderate Complexity: 1 Mod PT Treatments $Therapeutic Activity: 23-37 mins        [facility] M,PT Acute Rehab Services [phone]   Alvira Philips 01/05/2022, 12:45 PM

## 2022-01-05 NOTE — Evaluation (Signed)
Clinical/Bedside Swallow Evaluation Patient Details  Name: Jeff Willis MRN: [id number] Date of Birth: August 02, 1940  Today's Date: 01/05/2022 Time: SLP Start Time (ACUTE ONLY): 1017 SLP Stop Time (ACUTE ONLY): 1055 SLP Time Calculation (min) (ACUTE ONLY): 38 min  Past Medical History:  Past Medical History:  Diagnosis Date   Back pain    Elevated PSA    Essential tremor    Hypercholesteremia    Neuropathy    Prostate cancer ([place])    Sinus tachycardia 01/30/2018   Past Surgical History:  Past Surgical History:  Procedure Laterality Date   COLONOSCOPY WITH PROPOFOL N/A 08/10/2021   Procedure: COLONOSCOPY WITH PROPOFOL;  Surgeon: Carol Ada, MD;  Location: [facility];  Service: Gastroenterology;  Laterality: N/A;   ESOPHAGEAL DILATION  08/10/2021   Procedure: ESOPHAGEAL DILATION;  Surgeon: Carol Ada, MD;  Location: [facility];  Service: Gastroenterology;;   ESOPHAGOGASTRODUODENOSCOPY (EGD) WITH PROPOFOL N/A 08/10/2021   Procedure: ESOPHAGOGASTRODUODENOSCOPY (EGD) WITH PROPOFOL;  Surgeon: Carol Ada, MD;  Location: [facility];  Service: Gastroenterology;  Laterality: N/A;   HEMOSTASIS CLIP PLACEMENT  08/10/2021   Procedure: HEMOSTASIS CLIP PLACEMENT;  Surgeon: Carol Ada, MD;  Location: [facility];  Service: Gastroenterology;;   LUMBAR MICRODISCECTOMY     L4-5   POLYPECTOMY  08/10/2021   Procedure: POLYPECTOMY;  Surgeon: Carol Ada, MD;  Location: [facility];  Service: Gastroenterology;;   RADIOACTIVE SEED IMPLANT N/A 02/24/2018   Procedure: RADIOACTIVE SEED IMPLANT/BRACHYTHERAPY IMPLANT;  Surgeon: Festus Aloe, MD;  Location: [facility];  Service: Urology;  Laterality: N/A;   RIGHT HEART CATH N/A 11/22/2021   Procedure: RIGHT HEART CATH;  Surgeon: Troy Sine, MD;  Location: [facility];  Service: Cardiovascular;  Laterality: N/A;   SPACE OAR INSTILLATION N/A 02/24/2018   Procedure: SPACE OAR INSTILLATION;  Surgeon: Festus Aloe, MD;  Location: [facility];  Service: Urology;  Laterality: N/A;   HPI:  [place] Dano presented to [facility] ED on 12/30/21 with hypoxemia, fever, and cough that started on 8/28.  Dx'd with IPF flare in setting of COVID 19 and rhinovirus. Respiratory distress with intubation 9/5-8. PMHx of esophageal stenosis s/p dilation 08/09/2021. There was one benign-appearing, intrinsic mild stenosis found at the cricopharyngeus as well as multiple strictures at the UES. Esophagram 08/03/21: suspected cervical web, mild esophageal dysmotility, presbyesophagus, mild vallecular retention. PMHx includes idiopathic pulmonary fibrosis, essential tremor, neuropathy. Supportive family.    Assessment / Plan / Recommendation  Clinical Impression  Mr. Silveria was alert and participatory. His son, Jeff Willis, was at bedside and another son, Jeff Willis, was on speaker phone. Oral mechanism exam was normal with no focal deficits. Vocal quality was normal s/p three-day oral intubation.  Pt consumed sips of water, tspns of applesauce and minimal bites of a graham cracker. He demonstrated thorough mastication and no oral deficits.  Swallow response was present, palpable. There were multiple sub-swallows (3-4) per each bolus, regardless of consistency.  There was intermittent mild throat-clearing/cough after thin liquids and purees. Crackers elicited stronger, more pronounced coughing. Doubt a post-extubation dysphagia, nor a dysphagia related to dysynchrony of the swallow/respiratory cycles. Signs/symptoms are more aligned with his prior esophageal deficits.  Pt's sons acknowledge these behaviors are consistent with baseline swallowing. Recommend resuming a PO diet - start with dysphagia 1/purees (vegetarian), thin liquids; crush meds and offer one-at-a-time in puree. Will plan for MBS no earlier than Monday. Discussed results/recs with sons and Rn; they agree with plan. SLP will follow. SLP Visit Diagnosis:  Dysphagia,  unspecified (R13.10)    Aspiration Risk  No limitations    Diet Recommendation   Dysphagia 1, thin liquids  Medication Administration: Crushed with puree    Other  Recommendations Oral Care Recommendations: Oral care BID    Recommendations for follow up therapy are one component of a multi-disciplinary discharge planning process, led by the attending physician.  Recommendations may be updated based on patient status, additional functional criteria and insurance authorization.  Follow up Recommendations Other (comment) (tba)      Assistance Recommended at Discharge Other (comment)  Functional Status Assessment  (tba)  Frequency and Duration min 2x/week  2 weeks       Prognosis Prognosis for Safe Diet Advancement: Good      Swallow Study   General Date of Onset: 12/30/21 HPI: Jeff Willis presented to [facility] ED on 12/30/21 with hypoxemia, fever, and cough that started on 8/28.  Dx'd with IPF flare in setting of COVID 19 and rhinovirus. Respiratory distress with intubation 9/5-8. PMHx of esophageal stenosis s/p dilation 08/09/2021. There was one benign-appearing, intrinsic mild stenosis found at the cricopharyngeus as well as multiple strictures at the UES. Esophagram 08/03/21: suspected cervical web, mild esophageal dysmotility, presbyesophagus, mild vallecular retention. PMHx includes idiopathic pulmonary fibrosis, essential tremor, neuropathy. Supportive family. Type of Study: Bedside Swallow Evaluation Previous Swallow Assessment: none in EMR Diet Prior to this Study: NPO Temperature Spikes Noted: No Respiratory Status: Nasal cannula;Other (comment) (HFNC 30L, 40% Fi02) History of Recent Intubation: Yes Length of Intubations (days): 3 days Date extubated: 01/04/22 Behavior/Cognition: Alert;Cooperative Oral Cavity Assessment: Within Functional Limits Oral Care Completed by SLP: Recent completion by staff Oral Cavity - Dentition: Adequate natural dentition Vision:  Functional for self-feeding Self-Feeding Abilities: Needs assist Patient Positioning: Upright in bed Baseline Vocal Quality: Normal Volitional Cough: Strong Volitional Swallow: Able to elicit    Oral/Motor/Sensory Function Overall Oral Motor/Sensory Function: Within functional limits   Ice Chips Ice chips: Within functional limits   Thin Liquid Thin Liquid: Impaired Presentation: Straw Pharyngeal  Phase Impairments: Multiple swallows;Cough - Delayed    Nectar Thick Nectar Thick Liquid: Not tested   Honey Thick Honey Thick Liquid: Not tested   Puree Puree: Impaired Presentation: Spoon Pharyngeal Phase Impairments: Multiple swallows;Cough - Delayed   Solid     Solid: Impaired Pharyngeal Phase Impairments: Multiple swallows;Cough - Delayed      Assunta Curtis 01/05/2022,11:22 AM  Estill Bamberg L. Tivis Ringer, MA CCC/SLP Clinical Specialist - [place] Office number [phone]

## 2022-01-05 NOTE — Progress Notes (Addendum)
ICU Pharmacy Insulin Protocol Consult?   CBGs above goal 140-180 mg/dL: No  Current regimen (include units of SSI in last 24hr): resistant sliding scale (required 45u SSI/24h) + Detemir 6 Q12H  Medications affecting CBG levels: Steroids, transitioned to po dexamethasone '6mg'$ , NPO post extubation - just passed swallow for Dysphagia 1 diet   Plan: Adjust to Sensitive SSI TIDWC Continue detemir 6 u Q12H  - One dose was held due to NPO status (to resume 9/10 per discussion with Dr. Loanne Drilling) Patient transferring out of ICU level of Care - Pharmacy will sign off consult.   Sloan Leiter, PharmD, BCPS, BCCCP Clinical Pharmacist Please refer to [facility] for [place] numbers 01/05/2022, 1:37 PM

## 2022-01-06 ENCOUNTER — Inpatient Hospital Stay (HOSPITAL_COMMUNITY): Payer: Medicare Other

## 2022-01-06 DIAGNOSIS — L8990 Pressure ulcer of unspecified site, unspecified stage: Secondary | ICD-10-CM | POA: Insufficient documentation

## 2022-01-06 DIAGNOSIS — J9601 Acute respiratory failure with hypoxia: Secondary | ICD-10-CM | POA: Diagnosis not present

## 2022-01-06 LAB — CBC WITH DIFFERENTIAL/PLATELET
Abs Immature Granulocytes: 0.13 10*3/uL — ABNORMAL HIGH (ref 0.00–0.07)
Basophils Absolute: 0 10*3/uL (ref 0.0–0.1)
Basophils Relative: 0 %
Eosinophils Absolute: 0.1 10*3/uL (ref 0.0–0.5)
Eosinophils Relative: 0 %
HCT: 35.8 % — ABNORMAL LOW (ref 39.0–52.0)
Hemoglobin: 12.2 g/dL — ABNORMAL LOW (ref 13.0–17.0)
Immature Granulocytes: 1 %
Lymphocytes Relative: 9 %
Lymphs Abs: 1.2 10*3/uL (ref 0.7–4.0)
MCH: 32.6 pg (ref 26.0–34.0)
MCHC: 34.1 g/dL (ref 30.0–36.0)
MCV: 95.7 fL (ref 80.0–100.0)
Monocytes Absolute: 0.9 10*3/uL (ref 0.1–1.0)
Monocytes Relative: 7 %
Neutro Abs: 11.5 10*3/uL — ABNORMAL HIGH (ref 1.7–7.7)
Neutrophils Relative %: 83 %
Platelets: 280 10*3/uL (ref 150–400)
RBC: 3.74 MIL/uL — ABNORMAL LOW (ref 4.22–5.81)
RDW: 13.2 % (ref 11.5–15.5)
WBC: 13.9 10*3/uL — ABNORMAL HIGH (ref 4.0–10.5)
nRBC: 0 % (ref 0.0–0.2)

## 2022-01-06 LAB — GLUCOSE, CAPILLARY
Glucose-Capillary: 118 mg/dL — ABNORMAL HIGH (ref 70–99)
Glucose-Capillary: 157 mg/dL — ABNORMAL HIGH (ref 70–99)
Glucose-Capillary: 174 mg/dL — ABNORMAL HIGH (ref 70–99)
Glucose-Capillary: 133 mg/dL — ABNORMAL HIGH (ref 70–99)

## 2022-01-06 LAB — LACTATE DEHYDROGENASE: LDH: 352 U/L — ABNORMAL HIGH (ref 98–192)

## 2022-01-06 LAB — SEDIMENTATION RATE: Sed Rate: 7 mm/hr (ref 0–16)

## 2022-01-06 LAB — D-DIMER, QUANTITATIVE: D-Dimer, Quant: 6.87 ug/mL-FEU — ABNORMAL HIGH (ref 0.00–0.50)

## 2022-01-06 LAB — MAGNESIUM: Magnesium: 2.4 mg/dL (ref 1.7–2.4)

## 2022-01-06 LAB — FERRITIN: Ferritin: 108 ng/mL (ref 24–336)

## 2022-01-06 LAB — PHOSPHORUS: Phosphorus: 4.8 mg/dL — ABNORMAL HIGH (ref 2.5–4.6)

## 2022-01-06 LAB — C-REACTIVE PROTEIN: CRP: 1 mg/dL — ABNORMAL HIGH (ref <1.0)

## 2022-01-06 MED ORDER — HYDRALAZINE HCL 20 MG/ML IJ SOLN
10.0000 mg | Freq: Four times a day (QID) | INTRAMUSCULAR | Status: DC | PRN
Start: 2022-01-06 — End: 2022-01-18

## 2022-01-06 MED ORDER — OXYCODONE HCL 5 MG PO TABS
5.0000 mg | ORAL_TABLET | ORAL | Status: DC | PRN
  Administered 2022-01-07 (×3): 5 mg via ORAL
  Filled 2022-01-06 (×4): qty 1

## 2022-01-06 MED ORDER — BENZONATATE 100 MG PO CAPS: 100.0000 mg | ORAL_CAPSULE | Freq: Two times a day (BID) | ORAL | Status: DC | PRN

## 2022-01-06 MED ORDER — GUAIFENESIN 100 MG/5ML PO LIQD
5.0000 mL | ORAL | Status: DC | PRN
  Administered 2022-01-06 – 2022-01-18 (×7): 5 mL via ORAL
  Filled 2022-01-06 (×3): qty 10
  Filled 2022-01-06: qty 5
  Filled 2022-01-06: qty 10
  Filled 2022-01-06 (×2): qty 5

## 2022-01-06 MED ORDER — GUAIFENESIN ER 600 MG PO TB12: 600.0000 mg | ORAL_TABLET | Freq: Two times a day (BID) | ORAL | Status: DC | PRN

## 2022-01-06 MED ORDER — GADOBUTROL 1 MMOL/ML IV SOLN
7.0000 mL | Freq: Once | INTRAVENOUS | Status: AC | PRN
Start: 2022-01-06 — End: 2022-01-06
  Administered 2022-01-06: 7 mL via INTRAVENOUS

## 2022-01-06 MED ORDER — GABAPENTIN 250 MG/5ML PO SOLN
100.0000 mg | Freq: Three times a day (TID) | ORAL | Status: DC
  Administered 2022-01-06 – 2022-01-18 (×32): 100 mg via ORAL
  Filled 2022-01-06 (×44): qty 2

## 2022-01-06 MED ORDER — POLYETHYLENE GLYCOL 3350 17 G PO PACK
17.0000 g | PACK | Freq: Every day | ORAL | Status: DC
  Administered 2022-01-06 – 2022-01-10 (×5): 17 g via ORAL
  Filled 2022-01-06 (×6): qty 1

## 2022-01-06 MED ORDER — ENSURE ENLIVE PO LIQD
237.0000 mL | Freq: Two times a day (BID) | ORAL | Status: DC
  Administered 2022-01-07 – 2022-01-17 (×16): 237 mL via ORAL
  Filled 2022-01-06: qty 237

## 2022-01-06 MED ORDER — OXYCODONE HCL 5 MG PO TABS
5.0000 mg | ORAL_TABLET | ORAL | Status: DC | PRN
  Administered 2022-01-06: 5 mg via ORAL
  Filled 2022-01-06: qty 1

## 2022-01-06 NOTE — Progress Notes (Signed)
Nutrition Follow-up  DOCUMENTATION CODES:   Severe malnutrition in context of chronic illness  INTERVENTION:   Continue Puree, Vegetarian diet; family ok to bring in outside food.   Ensure Enlive po BID, each supplement provides 350 kcal and 20 grams of protein.   NUTRITION DIAGNOSIS:   Severe Malnutrition related to chronic illness as evidenced by severe muscle depletion, severe fat depletion.  Being addressed via supplements, po diet, family bringing in outside food  GOAL:   Patient will meet greater than or equal to 90% of their needs  Progressing  MONITOR:   TF tolerance  REASON FOR ASSESSMENT:   Consult Enteral/tube feeding initiation and management  ASSESSMENT:   Pt with PMH of esophageal stenosis s/p dilatation 07/2021, and Idiopathic Pulmonary Fibrosis on esbreit flew to [place] for a cruise and developed a fever and cough 8/28, dx with COVID 8/30, flew home 9/3 and admitted.  9/08 Extubated 9/09 Diet advanced to Dysphagia 1, Thin Liquids  Pt is a vegetarian-per previous RD note, pt does eat dairy but no eggs or meat/fish. Pt currently on Puree Diet. Noted MD ok with family bringing in outside food for patient, food from outside should be compliant with puree consistency. Encourage protein intake of diary products, mashed/puree lentis/dried beans, silken tofu, etc. Diet was incorrectly entered into [place]; RD corrected  Noted plan for MBS tomorrow.   No recorded po intake, family assisting pt at meal times per RN  Noted newly documented stage 2 PI to buttocks. Noted Vit C and Zinc have been ordered by MD  Labs: reviewed Meds: ss novolog, levemir, zinc, vit C   Diet Order:   Diet Order             DIET - DYS 1 Room service appropriate? Yes with Assist; Fluid consistency: Thin  Diet effective now                   EDUCATION NEEDS:   No education needs have been identified at this time  Skin:  Skin Assessment: Skin  Integrity Issues: Skin Integrity Issues:: Stage II Stage II: buttocks  Last BM:  9/8  Height:   Ht Readings from Last 1 Encounters:  01/03/22 '5\' 6"'$  (1.676 m)    Weight:   Wt Readings from Last 1 Encounters:  01/06/22 68 kg    BMI:  Body mass index is 24.2 kg/m.  Estimated Nutritional Needs:   Kcal:  1700-1900  Protein:  90-110 grams  Fluid:  >1.7 L/day    Kerman Passey MS, RDN, LDN, CNSC Registered Dietitian 3 Clinical Nutrition RD Pager and On-Call Pager Number Located in [place]

## 2022-01-06 NOTE — Progress Notes (Signed)
[facility] not in use at this time.

## 2022-01-06 NOTE — Progress Notes (Addendum)
PROGRESS NOTE    Jeff Willis  AGT:[id number] DOB: 20-Jan-1941 DOA: 12/30/2021 PCP: Gaynelle Arabian, MD   Brief Narrative: 81 year old with past medical history significant for known esophageal stenosis status post dilation April 2023, IPF on Esbriet (FVC 84% and DLCO 45 % May 2023) patient flew to [place] for a cruise, on 12/24/2021 she developed fever cough.  On 80/30/2023 on the cruise she was diagnosed with COVID-19 infection.  She declined Paxlovid at that time, she had previously experienced side effects from it.  She developed cough, failure to thrive, return to [place] via flight 12/30/2021.  Pulse ox was checked and she was found to be hypoxic presented via EMS.  COVID PCR positive.  CT chest with contrast negative for PE. -She was admitted to the hospital 12/30/2021 with acute hypoxic respiratory failure secondary to COVID-19.   -9/4 she was placed on BiPAP and transferred to the ICU.   -9/5 she was intubated due to respiratory failure.   -9/8 She was extubated.    Assessment & Plan:   Principal Problem:   Acute respiratory failure with hypoxia (HCC) Active Problems:   Interstitial lung disease ([place])   COVID-19 virus infection   Hyponatremia   History of sinus tachycardia   Pneumonia due to COVID-19 virus   Protein-calorie malnutrition, severe   Pressure injury of skin  1-Acute Hypoxic respiratory failure IPF flare in the setting of COVID-19 and rhinovirus -Patient on admission placed on 3 L of oxygen subsequently 9/5 developed worsening respiratory failure requiring intubation. -Postintubation required high flow nasal cannula 12 L---currently down to 6 L.  -Continue with Dexamethasone.  -He completed 5 days antibiotics.  -Oxygen requirement down to 5 L.  -Received one time dose lasix 9/09. -Continue with flutter valve, PRN Mucinex.   Tachycardia/A-fib Hypertension Demand ischemia -Continue with metoprolol   Diabetes: Levemir 6 Unit BID.  SSI>   Known  esophageal dilation April 2023 History of dysphagia for solid and spices On puree diet. Per family he has been tolerating puree diet.  PPI  Anemia of chronic disease;  Hb stable.   Mild protein caloric malnutrition due to acute illness Failure to thrive due to acute illness, weight loss from IPF -puree diet.  -BSM tomorrow.  -nutritionist consulted.   Back pain: acute on chronic.  Norco is not helping, patient is still having lower back pain, shooting pain to leg  He has try before gabapentin 600 mg TID, but  made him sleepy, help with pain. Plan to start low dose gabapentin 100 mg TID and monitor for sedation.  Will change Vicodin to oxycodone PRN>  Plan to proceed with MRI lumbar spine.   Elevated BP; could be related to steroids, and pain.  PRN hydralazine  Elevated D dimer; in setting of covid. Doppler LE negative for DVT>  On Lovenox See wound care documentation Pressure Injury 01/05/22 Buttocks Right Stage 2 -  Partial thickness loss of dermis presenting as a shallow open injury with a red, pink wound bed without slough. (Active)  01/05/22 2130  Location: Buttocks  Location Orientation: Right  Staging: Stage 2 -  Partial thickness loss of dermis presenting as a shallow open injury with a red, pink wound bed without slough.  Wound Description (Comments):   Present on Admission:   Dressing Type Foam - Lift dressing to assess site every shift 01/05/22 2130     Nutrition Problem: Severe Malnutrition Etiology: chronic illness    Signs/Symptoms: severe muscle depletion, severe fat depletion  Interventions: Prostat, Tube feeding  Estimated body mass index is 24.2 kg/m as calculated from the following:   Height as of this encounter: '5\' 6"'$  (1.676 m).   Weight as of this encounter: 68 kg.   DVT prophylaxis: Lovenox Code Status: Full code Family Communication: Multiples family at bedside.  Disposition Plan:  Status is: Inpatient Remains inpatient appropriate  because: management resp failure    Consultants:  CCM  Procedures:  ECHO;   Antimicrobials:    Subjective: He is alert, he report breathing better. He has been having uncontrolled back pain, worse than baseline. He report shooting pain to right leg. He has not been coughing as much as yesterday. Last BM 2 days ago.   Objective: Vitals:   01/06/22 0012 01/06/22 0213 01/06/22 0449 01/06/22 0500  BP: (!) 157/93 (!) 149/99 (!) 165/93   Pulse:      Resp: (!) 21 (!) 22    Temp:      TempSrc:      SpO2: 100% 100% 99%   Weight:    68 kg  Height:        Intake/Output Summary (Last 24 hours) at 01/06/2022 0756 Last data filed at 01/05/2022 1700 Gross per 24 hour  Intake 110 ml  Output 1200 ml  Net -1090 ml   Filed Weights   01/04/22 0500 01/05/22 0500 01/06/22 0500  Weight: 70.1 kg 68.1 kg 68 kg    Examination:  General exam: Appears calm and comfortable  Respiratory system: Clear to auscultation. Respiratory effort normal. Cardiovascular system: S1 & S2 heard, RRR.  Gastrointestinal system: Abdomen is nondistended, soft and nontender. No organomegaly or masses felt. Normal bowel sounds heard. Central nervous system: Alert and oriented.  Moves all 4 extremities Extremities: Symmetric 5 x 5 power.  Data Reviewed: I have personally reviewed following labs and imaging studies  CBC: Recent Labs  Lab 01/02/22 1701 01/03/22 0344 01/03/22 1525 01/04/22 0320 01/05/22 0500 01/06/22 0549  WBC 14.3* 13.3*  --  13.5* 16.8* 13.9*  NEUTROABS 13.1* 12.2*  --  11.7* 13.8* 11.5*  HGB 10.6* 11.7* 11.2* 10.8* 13.4 12.2*  HCT 30.6* 34.2* 33.0* 31.0* 38.2* 35.8*  MCV 95.0 95.3  --  94.8 93.9 95.7  PLT 194 211  --  208 298 505   Basic Metabolic Panel: Recent Labs  Lab 01/02/22 0355 01/02/22 0535 01/02/22 1701 01/03/22 0344 01/03/22 1245 01/03/22 1525 01/04/22 0320 01/05/22 0500 01/06/22 0549  NA 138   < >  --  134* 137 138 140 139  --   K 4.3   < >  --  5.2* 4.8 4.9 4.7  4.3  --   CL 103  --   --  101 106  --  104 102  --   CO2 28  --   --  28 27  --  28 26  --   GLUCOSE 175*  --   --  222* 203*  --  90 151*  --   BUN 22  --   --  24* 29*  --  31* 45*  --   CREATININE 0.89  --   --  0.82 0.90  --  0.83 1.22  --   CALCIUM 8.0*  --   --  8.2* 8.0*  --  8.0* 8.5*  --   MG 2.4  --  2.3 2.4  --   --  2.3 2.6* 2.4  PHOS 3.0  --  2.7 2.8  --   --  2.5 4.8* 4.8*   < > = values in this interval not displayed.   GFR: Estimated Creatinine Clearance: 43.6 mL/min (by C-G formula based on SCr of 1.22 mg/dL). Liver Function Tests: Recent Labs  Lab 01/01/22 0530 01/02/22 0355 01/03/22 0344 01/04/22 0320 01/05/22 0500  AST 46* 39 19 36 38  ALT 42 39 34 50* 56*  ALKPHOS 90 66 73 60 68  BILITOT 0.6 0.2* 0.4 0.5 0.8  PROT 5.8* 4.6* 4.9* 4.5* 5.6*  ALBUMIN 2.1* 1.7* 1.9* 1.8* 2.5*   No results for input(s): "LIPASE", "AMYLASE" in the last 168 hours. No results for input(s): "AMMONIA" in the last 168 hours. Coagulation Profile: Recent Labs  Lab 12/30/21 2235  INR 1.2   Cardiac Enzymes: No results for input(s): "CKTOTAL", "CKMB", "CKMBINDEX", "TROPONINI" in the last 168 hours. BNP (last 3 results) Recent Labs    09/07/21 1218  PROBNP 211.0*   HbA1C: No results for input(s): "HGBA1C" in the last 72 hours. CBG: Recent Labs  Lab 01/05/22 0503 01/05/22 0759 01/05/22 1158 01/05/22 1537 01/05/22 2345  GLUCAP 158* 124* 137* 135* 156*   Lipid Profile: Recent Labs    01/04/22 0320  TRIG 36   Thyroid Function Tests: No results for input(s): "TSH", "T4TOTAL", "FREET4", "T3FREE", "THYROIDAB" in the last 72 hours. Anemia Panel: Recent Labs    01/05/22 0500 01/06/22 0549  FERRITIN 114 108   Sepsis Labs: Recent Labs  Lab 12/30/21 2233 12/30/21 2235 12/31/21 0503 01/01/22 1800 01/01/22 2035  PROCALCITON  --  0.99  --   --   --   LATICACIDVEN 1.9  --  1.2 1.9 1.8    Recent Results (from the past 240 hour(s))  Urine Culture     Status: None    Collection Time: 12/30/21 12:47 AM   Specimen: In/Out Cath Urine  Result Value Ref Range Status   Specimen Description IN/OUT CATH URINE  Final   Special Requests NONE  Final   Culture   Final    NO GROWTH Performed at [facility], [place] [address], [place], [place] [address]    Report Status 01/01/2022 FINAL  Final  Resp Panel by RT-PCR (Flu A&B, Covid) Anterior Nasal Swab     Status: Abnormal   Collection Time: 12/30/21 10:35 PM   Specimen: Anterior Nasal Swab  Result Value Ref Range Status   SARS Coronavirus 2 by RT PCR POSITIVE (A) NEGATIVE Final    Comment: (NOTE) SARS-CoV-2 target nucleic acids are DETECTED.  The SARS-CoV-2 RNA is generally detectable in upper respiratory specimens during the acute phase of infection. Positive results are indicative of the presence of the identified virus, but do not rule out bacterial infection or co-infection with other pathogens not detected by the test. Clinical correlation with patient history and other diagnostic information is necessary to determine patient infection status. The expected result is Negative.  Fact Sheet for Patients: EntrepreneurPulse.com.au  Fact Sheet for Healthcare Providers: IncredibleEmployment.be  This test is not yet approved or cleared by the [place] FDA and  has been authorized for detection and/or diagnosis of SARS-CoV-2 by FDA under an Emergency Use Authorization (EUA).  This EUA will remain in effect (meaning this test can be used) for the duration of  the COVID-19 declaration under Section 564(b)(1) of the A ct, 21 U.S.C. section 360bbb-3(b)(1), unless the authorization is terminated or revoked sooner.     Influenza A by PCR NEGATIVE NEGATIVE Final   Influenza B by PCR NEGATIVE NEGATIVE Final  Comment: (NOTE) The Xpert Xpress SARS-CoV-2/FLU/RSV plus assay is intended as an aid in the diagnosis of influenza from Nasopharyngeal swab specimens  and should not be used as a sole basis for treatment. Nasal washings and aspirates are unacceptable for Xpert Xpress SARS-CoV-2/FLU/RSV testing.  Fact Sheet for Patients: EntrepreneurPulse.com.au  Fact Sheet for Healthcare Providers: IncredibleEmployment.be  This test is not yet approved or cleared by the [place] FDA and has been authorized for detection and/or diagnosis of SARS-CoV-2 by FDA under an Emergency Use Authorization (EUA). This EUA will remain in effect (meaning this test can be used) for the duration of the COVID-19 declaration under Section 564(b)(1) of the Act, 21 U.S.C. section 360bbb-3(b)(1), unless the authorization is terminated or revoked.  Performed at [facility], [place] [address], [place], [place] [address]   Blood Culture (routine x 2)     Status: None   Collection Time: 12/30/21 10:35 PM   Specimen: BLOOD RIGHT FOREARM  Result Value Ref Range Status   Specimen Description BLOOD RIGHT FOREARM  Final   Special Requests   Final    BOTTLES DRAWN AEROBIC AND ANAEROBIC Blood Culture adequate volume   Culture   Final    NO GROWTH 5 DAYS Performed at [facility], [place] [address], [place], [place] [address]    Report Status 01/04/2022 FINAL  Final  Respiratory (~20 pathogens) panel by PCR     Status: Abnormal   Collection Time: 12/30/21 10:35 PM   Specimen: Nasopharyngeal Swab; Respiratory  Result Value Ref Range Status   Adenovirus NOT DETECTED NOT DETECTED Final   Coronavirus 229E NOT DETECTED NOT DETECTED Final    Comment: (NOTE) The Coronavirus on the Respiratory Panel, DOES NOT test for the novel  Coronavirus (2019 nCoV)    Coronavirus HKU1 NOT DETECTED NOT DETECTED Final   Coronavirus NL63 NOT DETECTED NOT DETECTED Final   Coronavirus OC43 NOT DETECTED NOT DETECTED Final   Metapneumovirus NOT DETECTED NOT DETECTED Final   Rhinovirus / Enterovirus DETECTED (A) NOT DETECTED Final   Influenza A NOT  DETECTED NOT DETECTED Final   Influenza B NOT DETECTED NOT DETECTED Final   Parainfluenza Virus 1 NOT DETECTED NOT DETECTED Final   Parainfluenza Virus 2 NOT DETECTED NOT DETECTED Final   Parainfluenza Virus 3 NOT DETECTED NOT DETECTED Final   Parainfluenza Virus 4 NOT DETECTED NOT DETECTED Final   Respiratory Syncytial Virus NOT DETECTED NOT DETECTED Final   Bordetella pertussis NOT DETECTED NOT DETECTED Final   Bordetella Parapertussis NOT DETECTED NOT DETECTED Final   Chlamydophila pneumoniae NOT DETECTED NOT DETECTED Final   Mycoplasma pneumoniae NOT DETECTED NOT DETECTED Final    Comment: Performed at [facility], [place]. [address] [place], [place] [address]  Blood Culture (routine x 2)     Status: None   Collection Time: 12/30/21 10:48 PM   Specimen: BLOOD LEFT FOREARM  Result Value Ref Range Status   Specimen Description BLOOD LEFT FOREARM  Final   Special Requests   Final    BOTTLES DRAWN AEROBIC AND ANAEROBIC Blood Culture adequate volume   Culture   Final    NO GROWTH 5 DAYS Performed at [facility], [place] [address], [place], [place] [address]    Report Status 01/04/2022 FINAL  Final         Radiology Studies: No results found.      Scheduled Meds:  ascorbic acid  500 mg Oral Daily   aspirin  81  mg Oral Daily   Chlorhexidine Gluconate Cloth  6 each Topical Q0600   dexamethasone  6 mg Oral Daily   enoxaparin (LOVENOX) injection  40 mg Subcutaneous Q24H   insulin aspart  0-9 Units Subcutaneous TID WC   insulin detemir  6 Units Subcutaneous Q12H   lidocaine  1 patch Transdermal Q24H   metoprolol tartrate  12.5 mg Oral BID   pantoprazole  40 mg Oral Daily   rosuvastatin  10 mg Oral QHS   senna  2 tablet Oral Daily   sodium chloride flush  10-40 mL Intracatheter Q12H   zinc sulfate  220 mg Oral Daily   Continuous Infusions:  sodium chloride Stopped (01/01/22 0609)     LOS: 6 days    Time spent: 35 Minutes.     Elmarie Shiley,  MD [facility]   If 7PM-7AM, please contact night-coverage [email or web address]  01/06/2022, 7:56 AM

## 2022-01-07 ENCOUNTER — Encounter (HOSPITAL_COMMUNITY): Payer: Self-pay | Admitting: Internal Medicine

## 2022-01-07 ENCOUNTER — Inpatient Hospital Stay (HOSPITAL_COMMUNITY): Payer: Medicare Other

## 2022-01-07 ENCOUNTER — Telehealth: Payer: Self-pay | Admitting: Critical Care Medicine

## 2022-01-07 DIAGNOSIS — U071 COVID-19: Secondary | ICD-10-CM | POA: Diagnosis not present

## 2022-01-07 DIAGNOSIS — J9601 Acute respiratory failure with hypoxia: Secondary | ICD-10-CM | POA: Diagnosis not present

## 2022-01-07 DIAGNOSIS — J84112 Idiopathic pulmonary fibrosis: Secondary | ICD-10-CM | POA: Diagnosis not present

## 2022-01-07 LAB — CBC WITH DIFFERENTIAL/PLATELET
Abs Immature Granulocytes: 0.12 10*3/uL — ABNORMAL HIGH (ref 0.00–0.07)
Basophils Absolute: 0 10*3/uL (ref 0.0–0.1)
Basophils Relative: 0 %
Eosinophils Absolute: 0.1 10*3/uL (ref 0.0–0.5)
Eosinophils Relative: 0 %
HCT: 36 % — ABNORMAL LOW (ref 39.0–52.0)
Hemoglobin: 11.8 g/dL — ABNORMAL LOW (ref 13.0–17.0)
Immature Granulocytes: 1 %
Lymphocytes Relative: 8 %
Lymphs Abs: 1.1 10*3/uL (ref 0.7–4.0)
MCH: 32.1 pg (ref 26.0–34.0)
MCHC: 32.8 g/dL (ref 30.0–36.0)
MCV: 97.8 fL (ref 80.0–100.0)
Monocytes Absolute: 1 10*3/uL (ref 0.1–1.0)
Monocytes Relative: 8 %
Neutro Abs: 10.8 10*3/uL — ABNORMAL HIGH (ref 1.7–7.7)
Neutrophils Relative %: 83 %
Platelets: 257 10*3/uL (ref 150–400)
RBC: 3.68 MIL/uL — ABNORMAL LOW (ref 4.22–5.81)
RDW: 13.2 % (ref 11.5–15.5)
WBC: 13.1 10*3/uL — ABNORMAL HIGH (ref 4.0–10.5)
nRBC: 0 % (ref 0.0–0.2)

## 2022-01-07 LAB — GLUCOSE, CAPILLARY
Glucose-Capillary: 119 mg/dL — ABNORMAL HIGH (ref 70–99)
Glucose-Capillary: 121 mg/dL — ABNORMAL HIGH (ref 70–99)
Glucose-Capillary: 237 mg/dL — ABNORMAL HIGH (ref 70–99)
Glucose-Capillary: 147 mg/dL — ABNORMAL HIGH (ref 70–99)

## 2022-01-07 LAB — PHOSPHORUS: Phosphorus: 4.1 mg/dL (ref 2.5–4.6)

## 2022-01-07 LAB — D-DIMER, QUANTITATIVE: D-Dimer, Quant: 3.81 ug/mL-FEU — ABNORMAL HIGH (ref 0.00–0.50)

## 2022-01-07 LAB — MAGNESIUM: Magnesium: 2.5 mg/dL — ABNORMAL HIGH (ref 1.7–2.4)

## 2022-01-07 LAB — TRIGLYCERIDES: Triglycerides: 70 mg/dL (ref <150)

## 2022-01-07 LAB — FERRITIN: Ferritin: 123 ng/mL (ref 24–336)

## 2022-01-07 LAB — SEDIMENTATION RATE: Sed Rate: 9 mm/hr (ref 0–16)

## 2022-01-07 LAB — QUANTIFERON-TB GOLD PLUS

## 2022-01-07 LAB — LACTATE DEHYDROGENASE: LDH: 379 U/L — ABNORMAL HIGH (ref 98–192)

## 2022-01-07 LAB — C-REACTIVE PROTEIN: CRP: 0.6 mg/dL (ref <1.0)

## 2022-01-07 NOTE — TOC Initial Note (Signed)
Transition of Care [facility]) - Initial/Assessment Note    Patient Details  Name: Jeff Willis MRN: [id number] Date of Birth: 1940/10/25  Transition [facility]) CM/SW Contact:    Bethena Roys, RN Phone Number: 01/07/2022, 4:36 PM  Clinical Narrative: Patient is a transfer from [facility] [address]. Risk for readmission assessment completed. PTA patient was independent from home with son and additional family support. Case Manager spoke with son [name] regarding disposition recommendations from PT for CIR. [facility] unable to accept the patient due to airborne precautions at this time. Case Manager did speak with son to see if it was okay to fax out to [facility] and son states he is agreeable. Information faxed to [facility] @ [phone]. Awaiting to see if they can accept on airborne precautions and if the insurance is approved.                Expected Discharge Plan: IP Rehab Facility Barriers to Discharge: Continued Medical Work up   Patient Goals and CMS Choice        Expected Discharge Plan and Services Expected Discharge Plan: [place] In-house Referral: NA Discharge Planning Services: CM Consult Post Acute Care Choice: IP Rehab Living arrangements for the past 2 months: Single Family Home                   DME Agency: NA                  Prior Living Arrangements/Services Living arrangements for the past 2 months: Single Family Home Lives with:: Spouse, Adult Children Patient language and need for interpreter reviewed:: Yes Do you feel safe going back to the place where you live?: Yes      Need for Family Participation in Patient Care: Yes (Comment) Care giver support system in place?: Yes (comment)   Criminal Activity/Legal Involvement Pertinent to Current Situation/Hospitalization: No - Comment as needed  Activities of Daily Living Home Assistive Devices/Equipment: None ADL Screening (condition at time of  admission) Patient's cognitive ability adequate to safely complete daily activities?: Yes Is the patient deaf or have difficulty hearing?: No Does the patient have difficulty seeing, even when wearing glasses/contacts?: No Does the patient have difficulty concentrating, remembering, or making decisions?: No Patient able to express need for assistance with ADLs?: Yes Does the patient have difficulty dressing or bathing?: No Independently performs ADLs?: Yes (appropriate for developmental age) Does the patient have difficulty walking or climbing stairs?: No Weakness of Legs: None Weakness of Arms/Hands: None  Permission Sought/Granted Permission sought to share information with : Family Supports, Customer service manager, Case Production designer, theatre/television/film granted to share info w AGENCY: [place] [facility] [facility]        Emotional Assessment Appearance:: Appears stated age     Orientation: : Oriented to Self, Oriented to Place, Oriented to Situation, Oriented to  Time Alcohol / Substance Use: Not Applicable Psych Involvement: No (comment)  Admission diagnosis:  Acute respiratory failure with hypoxia ([place]) [J96.01] Community acquired pneumonia, unspecified laterality [J18.9] Sepsis, due to unspecified organism, unspecified whether acute organ dysfunction present ([place]) [A41.9] COVID-19 [U07.1] Patient Active Problem List   Diagnosis Date Noted   Pressure injury of skin 01/06/2022   Protein-calorie malnutrition, severe 01/01/2022   Acute respiratory failure with hypoxia ([place]) 12/31/2021   COVID-19 virus infection 12/31/2021   History of sinus tachycardia 12/31/2021   Hyponatremia 12/31/2021   Pneumonia due to COVID-19  virus 12/31/2021   Colon cancer screening 07/27/2020   Dry mouth 07/27/2020   Epigastric pain 07/27/2020   Gastroesophageal reflux disease 07/27/2020   Spinal stenosis of lumbosacral region 07/27/2020   Interstitial lung disease ([place]) 09/09/2019   Sacroiliitis  ([place]) 08/04/2019   PAD (peripheral artery disease) ([place]) 08/04/2019   Upper airway cough syndrome 08/04/2019   Pain in left knee 08/12/2018   Malignant neoplasm of prostate ([place]) 10/08/2017   Cough 01/23/2017   Laryngopharyngeal reflux (LPR) 01/23/2017   Right lumbar radiculopathy 01/22/2017   PCP:  Gaynelle Arabian, MD Pharmacy:   [facility] [id number] [place], [place] [address] Phone: [phone] Fax: [place], [place] [address] [place] [address] Phone: [phone] Fax: [phone]     Social Determinants of Health (SDOH) Interventions    Readmission Risk Interventions    01/07/2022    4:36 PM  Readmission Risk Prevention Plan  Transportation Screening Complete  PCP or Specialist Appt within 3-5 Days Complete  HRI or [place] Complete  Social Work Consult for [place] Planning/Counseling Complete  Palliative Care Screening Not Applicable  Medication Review Press photographer) Referral to Pharmacy

## 2022-01-07 NOTE — Progress Notes (Signed)
OT Cancellation Note  Patient Details Name: Jeff Willis MRN: [id number] DOB: 08/08/1940   Cancelled Treatment:    Reason Eval/Treat Not Completed: Other (comment) Patient currently being cleaned up by RN and NT, OT and PT will follow back as time permits.   Corinne Ports E. Leston Schueller, OTR/L Acute Rehabilitation Services [phone]   Ascencion Dike 01/07/2022, 12:32 PM

## 2022-01-07 NOTE — Progress Notes (Addendum)
Modified Barium Swallow Progress Note  Patient Details  Name: Jeff Willis MRN: [id number] Date of Birth: Jun 09, 1940  Today's Date: 01/07/2022  Modified Barium Swallow completed.  Full report located under Chart Review in the Imaging Section.  Brief recommendations include the following:  Clinical Impression  Mr.Brister demonstrated a primary esophageal dysphagia and mild pharyngeal dysphagia.  Oral phase was WNL- pt tended to piecemeal solid boluses into pharynx, likely a behavioral strategy he has developed out of caution.  There was decreased base of tongue-to-pharyngeal wall compression, leading to residue that remained in the valleculae (solids >liquids) but cleared with secondary dry swallows. Laryngeal vestibule closure was reliable and consistent with no observed penetration nor aspiration.  A 13 mm pill  lodged briefly in the vallecular space, then above the UES.  It passed through the UES into the cervical esophagus, then returned to the hypopharynx.  A pureed bolus helped pass it through the esophagus without incident  The pt viewed the MBS in real time and we discussed the findings.  Recommend advancing diet to regular consistencies (avoid tough meats/breads or large boluses that may not pass through UES); continue thin liquids.  Crush pills >10 mm when possible. Pt will f/u with GI to determine if another dilation is warranted.  No further SLP f/u is needed - our service will sign off.      Swallow Evaluation Recommendations   Recommended Consults: Consider esophageal assessment   SLP Diet Recommendations: Thin liquid;Regular solids   Liquid Administration via: Cup;Straw   Medication Administration: Crushed with puree (when not contraindicated)   Supervision: Patient able to self feed;Staff to assist with self feeding       Postural Changes: Remain semi-upright after after feeds/meals (Comment)   Oral Care Recommendations: Oral care BID      Gaylord Seydel L.  Tivis Ringer, MA CCC/SLP Clinical Specialist - Acute Care SLP Acute Rehabilitation Services Office number [phone]   Juan Quam [facility] 01/07/2022,3:06 PM

## 2022-01-07 NOTE — Progress Notes (Signed)
NAME:  Jeff Willis, MRN:  [id number], DOB:  1940/12/10, LOS: 7 ADMISSION DATE:  12/30/2021, CONSULTATION DATE:  12/31/21 REFERRING MD:  Dr. Jennette Kettle, CHIEF COMPLAINT:  IPF patient with acute Covid-19   History of Present Illness:  Jeff Willis on.  IPF on esbreit (FVC 84% and DLCO 45% - may 2023) on esbreit. On room air. Noral RHC 11/22/21.  Follows with Dr Chase Caller. HRCT 821'@3'$  with worsening.  Flew to [place] for a cruise.  On 12/24/2021 developed fever and cough.  On 12/26/2021 and the cruciate was diagnosed with COVID 19 acute infection.  Was offered antiviral Paxlovid but he refused because of negative experience previously with side effects.  He continued to have fever cough and failure to thrive, low appetite and being bedbound.  He returned to [place] via flight on 12/30/2021 and at home was continuing to cough.  Pulse ox check at home showed hypoxemia and therefore was brought to the emergency department via EMS.  Here he is resting on 3 L nasal cannula with stable work of breathing.  COVID PCR is positive.  CT chest with contrast [none PE protocol but no big PE noted.  He has left greater than right diffuse groundglass opacities.  Pertinent  Medical History  IPF on esbriet, UIP, esophageal stenosis s/p dilatation 2023 Back pain, Elevated PSA, Essential tremor, Hypercholesteremia, Neuropathy, Prostate cancer ([place]), and Sinus tachycardia (01/30/2018).   Reports that he has never smoked. He has never used smokeless tobacco.   Has a past surgical history that includes Lumbar microdiscectomy; Radioactive seed implant (N/A, 02/24/2018); SPACE OAR INSTILLATION (N/A, 02/24/2018); Esophagogastroduodenoscopy (egd) with propofol (N/A, 08/10/2021); Colonoscopy with propofol (N/A, 08/10/2021); Esophageal dilation (08/10/2021); polypectomy (08/10/2021); Hemostasis clip placement (08/10/2021); and RIGHT  HEART CATH (N/A, 11/22/2021).  Significant Hospital Events: Including procedures, antibiotic start and stop dates in addition to other pertinent events   12/30/2021 - admitted 9/4- BiPAP, transferred to ICU 9/5- Intubated 0518 d/t respiratory failure, A line placed, levophed [id number] 9/8 Extubated 9/9 transferred out of ICU  Interim History / Subjective:  Still having significant back pain. No weakness or numbness in legs. Has long-standing back pain and follows with one of the spine clinics. Has had ESI in the past.   Objective   Blood pressure (!) 144/89, pulse 84, temperature (!) 96.3 F (35.7 C), temperature source Oral, resp. rate 18, height '5\' 6"'$  (1.676 m), weight 57 kg, SpO2 94 %. CVP:  [2 mmHg] 2 mmHg      Intake/Output Summary (Last 24 hours) at 01/07/2022 0750 Last data filed at 01/07/2022 0600 Gross per 24 hour  Intake 240 ml  Output 900 ml  Net -660 ml   UOP 950 (0.6) Filed Weights   01/05/22 0500 01/06/22 0500 01/07/22 0500  Weight: 68.1 kg 68 kg 57 kg   Physical Exam: General: ill appearing man lying in bed in NAD HEENT: [place]/AT, eyes anicteric Respiratory: faint basilar rhales. No accessory muscle use, able to speak in short phrases. Cardiovascular: S1S2, RRR GI:  thin, soft, NT Extremities: no peripheral edema, no clubbing or cyanosis Neuro: awake and alert, able to lift both legs off the bed. LE sensation intact. Skin:  warm, dry, no diffuse rashes Psych: cooperative with exam, normal affects   Resolved Hospital Problem list    Hypotension, Bradycardia, resolved Hyponatremia  Assessment & Plan:  IPF with acute exacerbation due to Covid-19 and Rhinovirus Acute  respiratory failure with hypoxia S/p CAP coverage. - pulmonary hygiene -con't weaning oxygen; needs ambulatory desaturation screening prior to discharge - Dexamethasone, complete 10 days - ok to restart Esbriet (family needs to bring from home); but it is best to try to swallow them whole (no  data on safety/ efficacy if crushed per PharmD) -recommend outpatient repeat CT scan in a few weeks to assess for progression of fibrosis  Acute on chronic back pain, foraminal narrowing on MRI -reviewed MRI findings with patient and family -agree with gabapentin; can titrate to effect -minimize opiates and respiratory suppressing meds -PT, OT  Atrial tachycardia/A fib. episode  In normal sinus rhythm  Hypertension Demand ischemia DM Known esophageal stricture, s/p dilatation April 2023 History of dysphagia for solids and spices Anemia of chronic disease - Present on Admit Mild protein calorie malnutrition due to acute illness - Present on Admit Failure to thrive due to acute illness and weight loss from IPF -  Back pain  Rest of care per primary. 3 children updated at bedside and via phone. PCCM will be available as needed. Needs follow up in about 4 weeks in pulmonology clinic- will request.  Best Practice (right click and "Reselect all SmartList Selections" daily)   Diet/type: tubefeeds DVT prophylaxis: lovenox GI prophylaxis: PPI Lines: Arterial Line, PICC line Foley:  No urinary cath Code Status:  full code Last date of multidisciplinary goals of care discussion 9/6   Critical care time: Souris Sherol Sabas, DO 01/07/22 10:03 AM [facility]

## 2022-01-07 NOTE — Progress Notes (Signed)
Called in regards to this patient's MRI because he was having back pain. MRI just degenerative spondylosis throughout his spine but not canal stenosis and certainly nothing that needs to be taken care of acutely. This is all degenerative spondylosis that has happened over years. He is a patient of Dr. Lorre Nick so I would suggest he follow up as an outpatient with him. There is no role for neurosurgery right now.

## 2022-01-07 NOTE — Telephone Encounter (Signed)
Office appointment requested in about 4 weeks; can cancel appointment scheduled later this week since he is still hospitalized.  Julian Hy, DO 01/07/22 10:05 AM [facility]

## 2022-01-07 NOTE — Progress Notes (Signed)
Inpatient Rehabilitation Admissions Coordinator   Noted per Dr Tyrell Antonio that airborne precautions can be removed on 9/21, 21 days from 8/30 initial positive result on cruise . Other rehab [facility] should be pursued as [facility] unable consider for admit at this time .   Danne Baxter, RN, MSN Rehab Admissions Coordinator [phone] 01/07/2022 3:17 PM

## 2022-01-07 NOTE — Progress Notes (Signed)
PROGRESS NOTE    Jeff Willis  RKY:[id number] DOB: 1940-05-01 DOA: 12/30/2021 PCP: Gaynelle Arabian, MD   Brief Narrative: 81 year old with past medical history significant for known esophageal stenosis status post dilation April 2023, IPF on Esbriet (FVC 84% and DLCO 45 % May 2023) patient flew to [place] for a cruise, on 12/24/2021 she developed fever cough.  On 80/30/2023 on the cruise she was diagnosed with COVID-19 infection.  She declined Paxlovid at that time, she had previously experienced side effects from it.  She developed cough, failure to thrive, return to [place] via flight 12/30/2021.  Pulse ox was checked and she was found to be hypoxic presented via EMS.  COVID PCR positive.  CT chest with contrast negative for PE. -She was admitted to the hospital 12/30/2021 with acute hypoxic respiratory failure secondary to COVID-19.   -9/4 He was placed on BiPAP and transferred to the ICU.   -9/5 He was intubated due to respiratory failure.   -9/8 He was extubated.    Assessment & Plan:   Principal Problem:   Acute respiratory failure with hypoxia (HCC) Active Problems:   Interstitial lung disease ([place])   COVID-19 virus infection   Hyponatremia   History of sinus tachycardia   Pneumonia due to COVID-19 virus   Protein-calorie malnutrition, severe   Pressure injury of skin  1-Acute Hypoxic respiratory failure IPF flare in the setting of COVID-19 and rhinovirus -Patient on admission placed on 3 L of oxygen subsequently 9/5 developed worsening respiratory failure requiring intubation. -Postintubation required high flow nasal cannula 12 L---currently down to 6 L.  -Continue with Dexamethasone.  -He completed 5 days antibiotics.  -Oxygen requirement down to 3 L.  -Received one time dose lasix 9/09. -Continue with flutter valve, PRN Mucinex.  -Patient needs to be on airborne precaution for covid for 21 days from 8/30 (Covid test perform at the  cruise) . Off isolation  9/21.  Tachycardia/A-fib Hypertension Demand ischemia -Continue with metoprolol -PRN hydralazine.    Diabetes: Levemir 6 Unit BID.  SSI>   Known esophageal dilation April 2023 History of dysphagia for solid and spices On puree diet. Per family he has been tolerating puree diet.  PPI  Anemia of chronic disease;  Hb stable.   Mild protein caloric malnutrition due to acute illness Failure to thrive due to acute illness, weight loss from IPF -puree diet.  -BSM tomorrow.  -nutritionist consulted.   Back pain: acute on chronic.  -He has try before gabapentin 600 mg TID, but  made him feel weird.  -Started low dose gabapentin 100 mg TID 9/10. Continue with current dose.  -Continue with oxycodone PRN>  MRI Lumbar Spine: 1. L5-S1 right paracentral disc extrusion with caudal migration, new from the prior exam, which effaces the right lateral recess and contacts the descending right S1 nerve roots. Edema and enhancement at the superior aspect of L4, which appears concentrated about the superior endplate, with edema in the intervertebral disc, and a smaller focus of similar abnormal signal at the inferior endplate of L3. These findings are nonspecific and can be seen in the setting of degenerative changes/acute Schmorl's node formation, which can be painful, although early discitis-osteomyelitis can appear similar. L3-L4 mild spinal canal stenosis, unchanged. L4-L5 and L5-S1 mild bilateral neural foraminal narrowing, unchanged. Neurosurgery consulted, they think MRI finding consistent with degenerative spondylosis throughout spine. No need for acute intervention.  I have also contacted Dr Patrice Paradise, awaiting call back.  Blood culture ordered/ Blood culture from admission  negative. Recent ESR normal, CRP normal. Inflammatory marker initially elevate on admission likely related to covid.  He is on dexamethasone for covid, which should help with inflammation.   Elevated BP; could be related to  steroids, and pain.  PRN hydralazine  Elevated D dimer; in setting of covid. Doppler LE negative for DVT>  On Lovenox. Level decreasing.   See wound care documentation below.  Pressure Injury 01/05/22 Buttocks Right Stage 2 -  Partial thickness loss of dermis presenting as a shallow open injury with a red, pink wound bed without slough. (Active)  01/05/22 2130  Location: Buttocks  Location Orientation: Right  Staging: Stage 2 -  Partial thickness loss of dermis presenting as a shallow open injury with a red, pink wound bed without slough.  Wound Description (Comments):   Present on Admission:   Dressing Type Foam - Lift dressing to assess site every shift 01/07/22 0811     Nutrition Problem: Severe Malnutrition Etiology: chronic illness    Signs/Symptoms: severe muscle depletion, severe fat depletion    Interventions: Prostat, Tube feeding  Estimated body mass index is 20.28 kg/m as calculated from the following:   Height as of this encounter: 5' 6" (1.676 m).   Weight as of this encounter: 57 kg.   DVT prophylaxis: Lovenox Code Status: Full code Family Communication: Son at bedside, and over phone during rounds.  Disposition Plan:  Status is: Inpatient Remains inpatient appropriate because: management resp failure    Consultants:  CCM  Procedures:  ECHO;   Antimicrobials:    Subjective: Patient report back decreases to 4 after he takes oxycodone. It has been helping. He required so far 3 doses of oxycodone since yesterday. He has been tolerating gabapentin. Last BM 2 days ago. He is looking forward to work with PT   Objective: Vitals:   01/07/22 0039 01/07/22 0450 01/07/22 0500 01/07/22 0810  BP: (!) 153/89 (!) 144/89  (!) 156/88  Pulse: 80 84  82  Resp: (!) _0 Temp: 98.3 F (36.8 C) (!) 96.3 F (35.7 C)  98 F (36.7 C)  TempSrc: Oral Oral  Oral  SpO2: 98% 94%  97%  Weight:   57 kg   Height:        Intake/Output Summary (Last 24 hours)  at 01/07/2022 1145 Last data filed at 01/07/2022 0900 Gross per 24 hour  Intake 480 ml  Output 1250 ml  Net -770 ml    Filed Weights   01/05/22 0500 01/06/22 0500 01/07/22 0500  Weight: 68.1 kg 68 kg 57 kg    Examination:  General exam: NAD Respiratory system: Crackles bases Cardiovascular system: S 1, S 2 RRR Gastrointestinal system: BS present, soft, nt Central nervous system: Moves all 4 extremities Extremities: no edema  Data Reviewed: I have personally reviewed following labs and imaging studies  CBC: Recent Labs  Lab 01/03/22 0344 01/03/22 1525 01/04/22 0320 01/05/22 0500 01/06/22 0549 01/07/22 0500  WBC 13.3*  --  13.5* 16.8* 13.9* 13.1*  NEUTROABS 12.2*  --  11.7* 13.8* 11.5* 10.8*  HGB 11.7* 11.2* 10.8* 13.4 12.2* 11.8*  HCT 34.2* 33.0* 31.0* 38.2* 35.8* 36.0*  MCV 95.3  --  94.8 93.9 95.7 97.8  PLT 211  --  208 298 280 779    Basic Metabolic Panel: Recent Labs  Lab 01/02/22 0355 01/02/22 0535 01/03/22 0344 01/03/22 1245 01/03/22 1525 01/04/22 0320 01/05/22 0500 01/06/22 0549 01/07/22 0500  NA 138   < > 134* 137  138 140 139  --   --   K 4.3   < > 5.2* 4.8 4.9 4.7 4.3  --   --   CL 103  --  101 106  --  104 102  --   --   CO2 28  --  28 27  --  28 26  --   --   GLUCOSE 175*  --  222* 203*  --  90 151*  --   --   BUN 22  --  24* 29*  --  31* 45*  --   --   CREATININE 0.89  --  0.82 0.90  --  0.83 1.22  --   --   CALCIUM 8.0*  --  8.2* 8.0*  --  8.0* 8.5*  --   --   MG 2.4   < > 2.4  --   --  2.3 2.6* 2.4 2.5*  PHOS 3.0   < > 2.8  --   --  2.5 4.8* 4.8* 4.1   < > = values in this interval not displayed.    GFR: Estimated Creatinine Clearance: 38.9 mL/min (by C-G formula based on SCr of 1.22 mg/dL). Liver Function Tests: Recent Labs  Lab 01/01/22 0530 01/02/22 0355 01/03/22 0344 01/04/22 0320 01/05/22 0500  AST 46* 39 19 36 38  ALT 42 39 34 50* 56*  ALKPHOS 90 66 73 60 68  BILITOT 0.6 0.2* 0.4 0.5 0.8  PROT 5.8* 4.6* 4.9* 4.5* 5.6*   ALBUMIN 2.1* 1.7* 1.9* 1.8* 2.5*    No results for input(s): "LIPASE", "AMYLASE" in the last 168 hours. No results for input(s): "AMMONIA" in the last 168 hours. Coagulation Profile: No results for input(s): "INR", "PROTIME" in the last 168 hours.  Cardiac Enzymes: No results for input(s): "CKTOTAL", "CKMB", "CKMBINDEX", "TROPONINI" in the last 168 hours. BNP (last 3 results) Recent Labs    09/07/21 1218  PROBNP 211.0*    HbA1C: No results for input(s): "HGBA1C" in the last 72 hours. CBG: Recent Labs  Lab 01/06/22 1148 01/06/22 1630 01/06/22 2038 01/07/22 0802 01/07/22 1131  GLUCAP 157* 174* 133* 119* 121*    Lipid Profile: Recent Labs    01/07/22 0500  TRIG 70    Thyroid Function Tests: No results for input(s): "TSH", "T4TOTAL", "FREET4", "T3FREE", "THYROIDAB" in the last 72 hours. Anemia Panel: Recent Labs    01/06/22 0549 01/07/22 0500  FERRITIN 108 123    Sepsis Labs: Recent Labs  Lab 01/01/22 1800 01/01/22 2035  LATICACIDVEN 1.9 1.8     Recent Results (from the past 240 hour(s))  Urine Culture     Status: None   Collection Time: 12/30/21 12:47 AM   Specimen: In/Out Cath Urine  Result Value Ref Range Status   Specimen Description IN/OUT CATH URINE  Final   Special Requests NONE  Final   Culture   Final    NO GROWTH Performed at [facility], [place] [address] [place], [place] [address]    Report Status 01/01/2022 FINAL  Final  Resp Panel by RT-PCR (Flu A&B, Covid) Anterior Nasal Swab     Status: Abnormal   Collection Time: 12/30/21 10:35 PM   Specimen: Anterior Nasal Swab  Result Value Ref Range Status   SARS Coronavirus 2 by RT PCR POSITIVE (A) NEGATIVE Final    Comment: (NOTE) SARS-CoV-2 target nucleic acids are DETECTED.  The SARS-CoV-2 RNA is generally detectable in upper respiratory specimens during the acute phase of infection.  Positive results are indicative of the presence of the identified virus, but do not rule out  bacterial infection or co-infection with other pathogens not detected by the test. Clinical correlation with patient history and other diagnostic information is necessary to determine patient infection status. The expected result is Negative.  Fact Sheet for Patients: EntrepreneurPulse.com.au  Fact Sheet for Healthcare Providers: IncredibleEmployment.be  This test is not yet approved or cleared by the [place] FDA and  has been authorized for detection and/or diagnosis of SARS-CoV-2 by FDA under an Emergency Use Authorization (EUA).  This EUA will remain in effect (meaning this test can be used) for the duration of  the COVID-19 declaration under Section 564(b)(1) of the A ct, 21 U.S.C. section 360bbb-3(b)(1), unless the authorization is terminated or revoked sooner.     Influenza A by PCR NEGATIVE NEGATIVE Final   Influenza B by PCR NEGATIVE NEGATIVE Final    Comment: (NOTE) The Xpert Xpress SARS-CoV-2/FLU/RSV plus assay is intended as an aid in the diagnosis of influenza from Nasopharyngeal swab specimens and should not be used as a sole basis for treatment. Nasal washings and aspirates are unacceptable for Xpert Xpress SARS-CoV-2/FLU/RSV testing.  Fact Sheet for Patients: EntrepreneurPulse.com.au  Fact Sheet for Healthcare Providers: IncredibleEmployment.be  This test is not yet approved or cleared by the [place] FDA and has been authorized for detection and/or diagnosis of SARS-CoV-2 by FDA under an Emergency Use Authorization (EUA). This EUA will remain in effect (meaning this test can be used) for the duration of the COVID-19 declaration under Section 564(b)(1) of the Act, 21 U.S.C. section 360bbb-3(b)(1), unless the authorization is terminated or revoked.  Performed at [facility], [place] [address] [place], [place] [address]   Blood Culture (routine x 2)     Status: None    Collection Time: 12/30/21 10:35 PM   Specimen: BLOOD RIGHT FOREARM  Result Value Ref Range Status   Specimen Description BLOOD RIGHT FOREARM  Final   Special Requests   Final    BOTTLES DRAWN AEROBIC AND ANAEROBIC Blood Culture adequate volume   Culture   Final    NO GROWTH 5 DAYS Performed at [facility], [place] [address], [place], [place] [address]    Report Status 01/04/2022 FINAL  Final  Respiratory (~20 pathogens) panel by PCR     Status: Abnormal   Collection Time: 12/30/21 10:35 PM   Specimen: Nasopharyngeal Swab; Respiratory  Result Value Ref Range Status   Adenovirus NOT DETECTED NOT DETECTED Final   Coronavirus 229E NOT DETECTED NOT DETECTED Final    Comment: (NOTE) The Coronavirus on the Respiratory Panel, DOES NOT test for the novel  Coronavirus (2019 nCoV)    Coronavirus HKU1 NOT DETECTED NOT DETECTED Final   Coronavirus NL63 NOT DETECTED NOT DETECTED Final   Coronavirus OC43 NOT DETECTED NOT DETECTED Final   Metapneumovirus NOT DETECTED NOT DETECTED Final   Rhinovirus / Enterovirus DETECTED (A) NOT DETECTED Final   Influenza A NOT DETECTED NOT DETECTED Final   Influenza B NOT DETECTED NOT DETECTED Final   Parainfluenza Virus 1 NOT DETECTED NOT DETECTED Final   Parainfluenza Virus 2 NOT DETECTED NOT DETECTED Final   Parainfluenza Virus 3 NOT DETECTED NOT DETECTED Final   Parainfluenza Virus 4 NOT DETECTED NOT DETECTED Final   Respiratory Syncytial Virus NOT DETECTED NOT DETECTED Final   Bordetella pertussis NOT DETECTED NOT DETECTED Final   Bordetella Parapertussis NOT DETECTED NOT DETECTED Final   Chlamydophila pneumoniae  NOT DETECTED NOT DETECTED Final   Mycoplasma pneumoniae NOT DETECTED NOT DETECTED Final    Comment: Performed at [facility], [place] [address] [place], [place] [address]  Blood Culture (routine x 2)     Status: None   Collection Time: 12/30/21 10:48 PM   Specimen: BLOOD LEFT FOREARM  Result Value Ref Range Status   Specimen  Description BLOOD LEFT FOREARM  Final   Special Requests   Final    BOTTLES DRAWN AEROBIC AND ANAEROBIC Blood Culture adequate volume   Culture   Final    NO GROWTH 5 DAYS Performed at [facility], [place] [address] [place], [place] [address]    Report Status 01/04/2022 FINAL  Final         Radiology Studies: MR Lumbar Spine W Wo Contrast  Result Date: 01/06/2022 CLINICAL DATA:  Low back pain; history of laminectomy EXAM: MRI LUMBAR SPINE WITHOUT AND WITH CONTRAST TECHNIQUE: Multiplanar and multiecho pulse sequences of the lumbar spine were obtained without and with intravenous contrast. CONTRAST:  61m GADAVIST GADOBUTROL 1 MMOL/ML IV SOLN COMPARISON:  06/22/2016 MRI lumbar spine FINDINGS: Segmentation:  5 lumbar-type vertebral bodies. Alignment: Reversal of the normal lumbar lordosis. Mild lumbar levocurvature. Trace retrolisthesis L4 on L5. Vertebrae: No acute fracture or suspicious osseous lesions. Increased T2 signal and contrast enhancement at the superior aspect of L4, which appears concentrated around the superior endplate, with increased T2 signal in the adjacent disc (series 4, image 10 and series 8, image 10). No abnormal enhancement within the disc. Small focus of similar signal at the inferior endplate of L3. Conus medullaris and cauda equina: Conus extends to the L1 level. Conus and cauda equina appear normal. No abnormal enhancement. Paraspinal and other soft tissues: Atrophy of the right-greater-than-left inferior paraspinous muscles. Disc levels: T12-L1: No significant disc bulge. No spinal canal stenosis or neural foraminal narrowing. L1-L2: Minimal disc bulge. Mild facet arthropathy. No spinal canal stenosis or neural foraminal narrowing. L2-L3: Minimal disc bulge. Mild facet arthropathy. No spinal canal stenosis or neural foraminal narrowing. L3-L4: Mild disc bulge. Mild-to-moderate facet arthropathy. Narrowing of the lateral recesses, which could affect the descending L4  nerve roots. Mild spinal canal stenosis, unchanged. No neural foraminal narrowing. L4-L5: Interval right laminectomy. Trace retrolisthesis and mild disc bulge. Moderate facet arthropathy. No residual spinal canal stenosis. Narrowing of the lateral recesses, which could affect the descending L5 nerve roots. Mild bilateral neural foraminal narrowing, unchanged. L5-S1: Mild disc bulge with right paracentral disc extrusion with caudal migration, new from the prior exam, which effaces the right lateral recess and contacts the descending right S1 nerve roots (series 6, image 41). Moderate to severe facet arthropathy. Narrowing of the lateral recesses, which could compress the descending S1 nerve roots. No spinal canal stenosis. Mild bilateral neural foraminal narrowing, unchanged. IMPRESSION: 1. L5-S1 right paracentral disc extrusion with caudal migration, new from the prior exam, which effaces the right lateral recess and contacts the descending right S1 nerve roots. 2. Edema and enhancement at the superior aspect of L4, which appears concentrated about the superior endplate, with edema in the intervertebral disc, and a smaller focus of similar abnormal signal at the inferior endplate of L3. These findings are nonspecific and can be seen in the setting of degenerative changes/acute Schmorl's node formation, which can be painful, although early discitis-osteomyelitis can appear similar. Correlate with inflammatory markers. 3. L3-L4 mild spinal canal stenosis, unchanged. 4. L4-L5 and L5-S1 mild bilateral neural foraminal narrowing, unchanged. Electronically  Signed   By: Merilyn Baba M.D.   On: 01/06/2022 21:04        Scheduled Meds:  ascorbic acid  500 mg Oral Daily   aspirin  81 mg Oral Daily   Chlorhexidine Gluconate Cloth  6 each Topical Q0600   dexamethasone  6 mg Oral Daily   enoxaparin (LOVENOX) injection  40 mg Subcutaneous Q24H   feeding supplement  237 mL Oral BID BM   gabapentin  100 mg Oral TID    insulin aspart  0-9 Units Subcutaneous TID WC   insulin detemir  6 Units Subcutaneous Q12H   lidocaine  1 patch Transdermal Q24H   metoprolol tartrate  12.5 mg Oral BID   pantoprazole  40 mg Oral Daily   polyethylene glycol  17 g Oral Daily   rosuvastatin  10 mg Oral QHS   senna  2 tablet Oral Daily   sodium chloride flush  10-40 mL Intracatheter Q12H   zinc sulfate  220 mg Oral Daily   Continuous Infusions:  sodium chloride Stopped (01/01/22 0609)     LOS: 7 days    Time spent: 35 Minutes.     Elmarie Shiley, MD [facility]   If 7PM-7AM, please contact night-coverage [email or web address]  01/07/2022, 11:45 AM

## 2022-01-07 NOTE — Progress Notes (Addendum)
Physical Therapy Treatment Patient Details Name: Jeff Willis MRN: [id number] DOB: [birth year] Today's Date: 01/07/2022   History of Present Illness Pt is 81 yo male admitted 9/3 with hypoxia, FTT after returning on flight from [place] same date. Pt developed cough 8/28 when flying to [place] for cruise with Covid + test 8/30. Pt with IPF flare in the setting of COVID-19 and rhinovirus. Intubation 9/5-9/8. PMhx: esophageal stenosis, PAD, GERD, spinal stenosis    PT Comments    Pt making good progress today.  He was able to ambulate short distances (8') , but fatigued easily with decreased O2 sats requiring seated rest breaks.  Requiring increased time/effort and min A for transfer and to ambulate (had +2 for safety today based on last visit , but could progress to assist of 1).  Pt requiring 3 L O2 rest and at least 4 L O2 with activity.   Pt with chronic back pain that is exacerbated at this time - provided HEP including prone on elbows, pelvic tilts, and pelvic tilts foot lift in hooklying - educated to stop if increases pain.    Recommendations for follow up therapy are one component of a multi-disciplinary discharge planning process, led by the attending physician.  Recommendations may be updated based on patient status, additional functional criteria and insurance authorization.  Follow Up Recommendations  Acute inpatient rehab (3hours/day)     Assistance Recommended at Discharge Frequent or constant Supervision/Assistance  Patient can return home with the following A lot of help with bathing/dressing/bathroom;Assistance with cooking/housework;Assist for transportation;Help with stairs or ramp for entrance;A lot of help with walking and/or transfers   Equipment Recommendations  Rolling walker (2 wheels);BSC/3in1    Recommendations for Other Services Rehab consult     Precautions / Restrictions Precautions Precautions: Fall     Mobility  Bed Mobility Overal bed mobility:  Needs Assistance Bed Mobility: Rolling, Sidelying to Sit, Sit to Sidelying Rolling: Supervision Sidelying to sit: Min assist     Sit to sidelying: Min assist General bed mobility comments: Cues for log roll technique for back pain; Increased time; light min A to lift trunk and for legs back to bed    Transfers Overall transfer level: Needs assistance Equipment used: Rolling walker (2 wheels) Transfers: Sit to/from Stand Sit to Stand: Min assist, +2 safety/equipment           General transfer comment: Performed sit to stand x 3 with min A of 2 for safety from bed and BSC.    Ambulation/Gait Ambulation/Gait assistance: Min assist, +2 safety/equipment Gait Distance (Feet): 8 Feet (2', 8'x2) Assistive device: Rolling walker (2 wheels) Gait Pattern/deviations: Step-to pattern, Decreased stride length, Shuffle Gait velocity: decreased     General Gait Details: Fatigued easily with 4/4 DOE requiring seated rest breaks.  Pt with shuflle gait and mild unsteadiness. Cues for pursed lip breathing   Stairs             Wheelchair Mobility    Modified Rankin (Stroke Patients Only)       Balance Overall balance assessment: Needs assistance Sitting-balance support: Feet supported Sitting balance-Leahy Scale: Fair     Standing balance support: Bilateral upper extremity supported, Reliant on assistive device for balance Standing balance-Leahy Scale: Poor Standing balance comment: Requiring RW and min guard-min A                            Cognition Arousal/Alertness: Awake/alert Behavior During Therapy: [facility] for  tasks assessed/performed Overall Cognitive Status: Within Functional Limits for tasks assessed                                          Exercises Other Exercises Other Exercises: Exercises for low back pain.  Pt did report back pain occasionally radiates down L leg.  Tried prone lying and prone on elbows for 3 mins with decrease in  pain from 4/10 to 3/10. Educated on importance of not increasing peripherlization of pain.  Also, provided gentle core exercises in supine: pelvic tilts in hooklying and pelvic tilts plus lifting foot off bed hooklying, performed x 5 reps.  Educated on maintaining pelvic tilt and stopping if pain increased.  Provided written HEP through [facility].  Access Code: [email or web address] URL: [email or web address] Date: 01/07/2022 Prepared by: Maggie Font  Program Notes -Start with lying prone and can progress to prone on elbows as able. -For pelvic tilts and supine march: make sure to try to keep back flat on bed and trunk still-Remember back precautions for pain control: No Bending, no Lifting, no Twisting; log roll technique for transfers.   Exercises - Lying Prone  - 3 x daily - 7 x weekly - 1 sets - 1 reps - 3-5 mins hold - Prone on Elbows Stretch  - 3 x daily - 7 x weekly - 1 sets - 1 reps - 1-2 mins hold - Supine Posterior Pelvic Tilt  - 3 x daily - 7 x weekly - 1 sets - 10 reps - Supine March with Posterior Pelvic Tilt  - 3 x daily - 7 x weekly - 1 sets - 10 reps   General Comments General comments (skin integrity, edema, etc.): Pt on 3 L O2 with sats 98% rest. Attempted RA but at rest dropped to 80% and required 1-2 mins to recover.  Started with 3 L for activity but again drop to low 80's with slow recovery.  Then increased to 4 L for activity with drop to 85% but recovering in 1-2 mins rest. Back to 3 L at rest with sats 98%      Pertinent Vitals/Pain Pain Assessment Pain Assessment: 0-10 Pain Score: 4  Pain Location: low back (chronic) Pain Descriptors / Indicators: Discomfort Pain Intervention(s): Limited activity within patient's tolerance, Monitored during session, Repositioned (provided HEP)    Home Living                          Prior Function            PT Goals (current goals can now be found in the care plan section) Progress towards PT goals:  Progressing toward goals    Frequency    Min 3X/week      PT Plan Current plan remains appropriate    Co-evaluation              AM-PAC PT "6 Clicks" Mobility   Outcome Measure  Help needed turning from your back to your side while in a flat bed without using bedrails?: A Little Help needed moving from lying on your back to sitting on the side of a flat bed without using bedrails?: A Little Help needed moving to and from a bed to a chair (including a wheelchair)?: A Little Help needed standing up from a chair using your arms (e.g., wheelchair or bedside chair)?: A Little Help needed  to walk in hospital room?: Total Help needed climbing 3-5 steps with a railing? : Total 6 Click Score: 14    End of Session Equipment Utilized During Treatment: Gait belt;Oxygen Activity Tolerance: Patient tolerated treatment well;Other (comment) (limited time due to going for swallow eval) Patient left: with call bell/phone within reach;in bed;with family/visitor present;with nursing/sitter in room Nurse Communication: Mobility status PT Visit Diagnosis: Unsteadiness on feet (R26.81);Muscle weakness (generalized) (M62.81);Pain     Time: 0211-1735 PT Time Calculation (min) (ACUTE ONLY): 24 min  Charges:  $Gait Training: 8-22 mins $Therapeutic Exercise: 8-22 mins                     Abran Richard, PT Acute Rehab [facility] [place] 01/07/2022, 2:51 PM

## 2022-01-07 NOTE — Telephone Encounter (Signed)
Rescheduled for 10/30 @ 3 for PFT B&A with followup with MR at 3:45. This is the first opening due to PFTs being booked out.  This will be printed on discharge papers. Nothing further needed.

## 2022-01-07 NOTE — Plan of Care (Signed)
  Problem: Education: Goal: Knowledge of risk factors and measures for prevention of condition will improve Outcome: Progressing   Problem: Coping: Goal: Psychosocial and spiritual needs will be supported Outcome: Progressing   Problem: Respiratory: Goal: Will maintain a patent airway Outcome: Progressing Goal: Complications related to the disease process, condition or treatment will be avoided or minimized Outcome: Progressing   Problem: Education: Goal: Knowledge of General Education information will improve Description: Including pain rating scale, medication(s)/side effects and non-pharmacologic comfort measures Outcome: Progressing   Problem: Health Behavior/Discharge Planning: Goal: Ability to manage health-related needs will improve Outcome: Progressing   Problem: Clinical Measurements: Goal: Ability to maintain clinical measurements within normal limits will improve Outcome: Progressing Goal: Will remain free from infection Outcome: Progressing Goal: Diagnostic test results will improve Outcome: Progressing Goal: Respiratory complications will improve Outcome: Progressing Goal: Cardiovascular complication will be avoided Outcome: Progressing   Problem: Activity: Goal: Risk for activity intolerance will decrease Outcome: Progressing   Problem: Nutrition: Goal: Adequate nutrition will be maintained Outcome: Progressing   Problem: Coping: Goal: Level of anxiety will decrease Outcome: Progressing   Problem: Elimination: Goal: Will not experience complications related to bowel motility Outcome: Progressing Goal: Will not experience complications related to urinary retention Outcome: Progressing   Problem: Pain Managment: Goal: General experience of comfort will improve Outcome: Progressing   Problem: Safety: Goal: Ability to remain free from injury will improve Outcome: Progressing   Problem: Skin Integrity: Goal: Risk for impaired skin integrity will  decrease Outcome: Progressing   Problem: Education: Goal: Ability to describe self-care measures that may prevent or decrease complications (Diabetes Survival Skills Education) will improve Outcome: Progressing Goal: Individualized Educational Video(s) Outcome: Progressing   Problem: Coping: Goal: Ability to adjust to condition or change in health will improve Outcome: Progressing   Problem: Fluid Volume: Goal: Ability to maintain a balanced intake and output will improve Outcome: Progressing   Problem: Health Behavior/Discharge Planning: Goal: Ability to identify and utilize available resources and services will improve Outcome: Progressing Goal: Ability to manage health-related needs will improve Outcome: Progressing   Problem: Metabolic: Goal: Ability to maintain appropriate glucose levels will improve Outcome: Progressing   Problem: Nutritional: Goal: Maintenance of adequate nutrition will improve Outcome: Progressing Goal: Progress toward achieving an optimal weight will improve Outcome: Progressing   Problem: Skin Integrity: Goal: Risk for impaired skin integrity will decrease Outcome: Progressing   Problem: Tissue Perfusion: Goal: Adequacy of tissue perfusion will improve Outcome: Progressing   

## 2022-01-07 NOTE — Progress Notes (Signed)
Inpatient Rehabilitation Admissions Coordinator   Inpatient rehab consult. received. Noted COVID +  and on precautions. Patients are eligible to be considered for admit to the [facility] when cleared from airborne precautions by acute MD or Infectious disease regardless of onset day. Please clarify when they are to be removed as noted positive on Cruise prior to arrival for admit. Please call me with any questions.    Danne Baxter, RN, MSN Rehab Admissions Coordinator [phone] 01/07/2022 9:09 AM

## 2022-01-07 NOTE — Progress Notes (Signed)
Occupational Therapy Treatment Patient Details Name: Jeff Willis MRN: [id number] DOB: Nov 18, 1940 Today's Date: 01/07/2022   History of present illness Pt is 81 yo male admitted 9/3 with hypoxia, FTT after returning on flight from [place] same date. Pt developed cough 8/28 when flying to [place] for cruise with Covid + test 8/30. Pt with IPF flare in the setting of COVID-19 and rhinovirus. Intubation 9/5-9/8. PMhx: esophageal stenosis, PAD, GERD, spinal stenosis   OT comments  Patient continues to make steady progress towards goals in skilled OT session. Patient's session encompassed increasing functional mobility, increasing activity tolerance, and ADLs. Patient now min A for transfers, and min A for upper body ADLs. Patient noted to require 4L of oxygen with activity, benefiting from pursed lip breathing cues and strategies (highest RR noted at 36 during session). OT recommendation remains appropriate, but may improve past need for intensive rehab due to current progress. OT will continue to follow acutely.    Recommendations for follow up therapy are one component of a multi-disciplinary discharge planning process, led by the attending physician.  Recommendations may be updated based on patient status, additional functional criteria and insurance authorization.    Follow Up Recommendations  Acute inpatient rehab (3hours/day)    Assistance Recommended at Discharge Frequent or constant Supervision/Assistance  Patient can return home with the following  A lot of help with bathing/dressing/bathroom;Help with stairs or ramp for entrance;Assist for transportation;Assistance with cooking/housework;A lot of help with walking and/or transfers   Equipment Recommendations       Recommendations for Other Services      Precautions / Restrictions Precautions Precautions: Fall Precaution Comments: watch O2 with activity Restrictions Weight Bearing Restrictions: No       Mobility Bed  Mobility Overal bed mobility: Needs Assistance Bed Mobility: Rolling, Sidelying to Sit, Sit to Sidelying Rolling: Supervision Sidelying to sit: Min assist     Sit to sidelying: Min assist General bed mobility comments: Cues for log roll technique for back pain; Increased time; light min A to lift trunk and for legs back to bed    Transfers Overall transfer level: Needs assistance Equipment used: Rolling walker (2 wheels) Transfers: Sit to/from Stand Sit to Stand: Min assist, +2 safety/equipment           General transfer comment: Performed sit to stand x 3 with min A of 2 for safety from bed and BSC.     Balance Overall balance assessment: Needs assistance Sitting-balance support: Feet supported Sitting balance-Leahy Scale: Fair     Standing balance support: Bilateral upper extremity supported, Reliant on assistive device for balance Standing balance-Leahy Scale: Poor Standing balance comment: Requiring RW and min guard-min A                           ADL either performed or assessed with clinical judgement   ADL Overall ADL's : Needs assistance/impaired                         Toilet Transfer: Minimal assistance;Ambulation;Cueing for safety;Cueing for sequencing;Rolling walker (2 wheels)           Functional mobility during ADLs: Minimal assistance;Cueing for safety;Cueing for sequencing;Rolling walker (2 wheels) General ADL Comments: Patient progressing towards goals, patient limited by fatigue and decreased O2 with activity    Extremity/Trunk Assessment              Vision  Perception     Praxis      Cognition Arousal/Alertness: Awake/alert Behavior During Therapy: WFL for tasks assessed/performed Overall Cognitive Status: Within Functional Limits for tasks assessed                                          Exercises      Shoulder Instructions       General Comments Pt on 3 L O2 with sats 98%  rest. Attempted RA but at rest dropped to 80% and required 1-2 mins to recover.  Started with 3 L for activity but again drop to low 80's with slow recovery.  Then increased to 4 L for activity with drop to 85% but recovering in 1-2 mins rest. Back to 3 L at rest with sats 98%    Pertinent Vitals/ Pain       Pain Assessment Pain Assessment: 0-10 Pain Score: 4  Pain Location: low back (chronic) Pain Descriptors / Indicators: Discomfort Pain Intervention(s): Limited activity within patient's tolerance, Monitored during session, Repositioned  Home Living                                          Prior Functioning/Environment              Frequency  Min 2X/week        Progress Toward Goals  OT Goals(current goals can now be found in the care plan section)  Progress towards OT goals: Progressing toward goals  Acute Rehab OT Goals OT Goal Formulation: With patient Time For Goal Achievement: 01/18/22 Potential to Achieve Goals: Good  Plan Discharge plan remains appropriate    Co-evaluation                 AM-PAC OT "6 Clicks" Daily Activity     Outcome Measure   Help from another person eating meals?: A Little Help from another person taking care of personal grooming?: A Little Help from another person toileting, which includes using toliet, bedpan, or urinal?: A Little Help from another person bathing (including washing, rinsing, drying)?: A Little Help from another person to put on and taking off regular upper body clothing?: A Little Help from another person to put on and taking off regular lower body clothing?: A Lot 6 Click Score: 17    End of Session Equipment Utilized During Treatment: Gait belt;Rolling walker (2 wheels)  OT Visit Diagnosis: Unsteadiness on feet (R26.81)   Activity Tolerance Patient limited by fatigue   Patient Left in bed;with call bell/phone within reach;with family/visitor present   Nurse Communication Mobility  status        Time: 5993-5701 OT Time Calculation (min): 16 min  Charges: OT General Charges $OT Visit: 1 Visit OT Treatments $Self Care/Home Management : 8-22 mins  Corinne Ports E. Cattie Tineo, OTR/L Acute Rehabilitation Services [phone]   Ascencion Dike 01/07/2022, 3:26 PM

## 2022-01-08 ENCOUNTER — Inpatient Hospital Stay (HOSPITAL_COMMUNITY): Payer: Medicare Other

## 2022-01-08 DIAGNOSIS — J9601 Acute respiratory failure with hypoxia: Secondary | ICD-10-CM | POA: Diagnosis not present

## 2022-01-08 LAB — CBC WITH DIFFERENTIAL/PLATELET
Abs Immature Granulocytes: 0.08 10*3/uL — ABNORMAL HIGH (ref 0.00–0.07)
Basophils Absolute: 0 10*3/uL (ref 0.0–0.1)
Basophils Relative: 0 %
Eosinophils Absolute: 0 10*3/uL (ref 0.0–0.5)
Eosinophils Relative: 0 %
HCT: 34.3 % — ABNORMAL LOW (ref 39.0–52.0)
Hemoglobin: 12.2 g/dL — ABNORMAL LOW (ref 13.0–17.0)
Immature Granulocytes: 1 %
Lymphocytes Relative: 10 %
Lymphs Abs: 1.1 10*3/uL (ref 0.7–4.0)
MCH: 33.2 pg (ref 26.0–34.0)
MCHC: 35.6 g/dL (ref 30.0–36.0)
MCV: 93.2 fL (ref 80.0–100.0)
Monocytes Absolute: 0.9 10*3/uL (ref 0.1–1.0)
Monocytes Relative: 8 %
Neutro Abs: 9.1 10*3/uL — ABNORMAL HIGH (ref 1.7–7.7)
Neutrophils Relative %: 81 %
Platelets: 222 10*3/uL (ref 150–400)
RBC: 3.68 MIL/uL — ABNORMAL LOW (ref 4.22–5.81)
RDW: 12.9 % (ref 11.5–15.5)
WBC: 11.2 10*3/uL — ABNORMAL HIGH (ref 4.0–10.5)
nRBC: 0 % (ref 0.0–0.2)

## 2022-01-08 LAB — BASIC METABOLIC PANEL
Anion gap: 3 — ABNORMAL LOW (ref 5–15)
BUN: 36 mg/dL — ABNORMAL HIGH (ref 8–23)
CO2: 31 mmol/L (ref 22–32)
Calcium: 8.8 mg/dL — ABNORMAL LOW (ref 8.9–10.3)
Chloride: 102 mmol/L (ref 98–111)
Creatinine, Ser: 0.93 mg/dL (ref 0.61–1.24)
GFR, Estimated: >60 mL/min (ref >60)
Glucose, Bld: 97 mg/dL (ref 70–99)
Potassium: 4.1 mmol/L (ref 3.5–5.1)
Sodium: 136 mmol/L (ref 135–145)

## 2022-01-08 LAB — GLUCOSE, CAPILLARY
Glucose-Capillary: 93 mg/dL (ref 70–99)
Glucose-Capillary: 138 mg/dL — ABNORMAL HIGH (ref 70–99)
Glucose-Capillary: 145 mg/dL — ABNORMAL HIGH (ref 70–99)
Glucose-Capillary: 173 mg/dL — ABNORMAL HIGH (ref 70–99)

## 2022-01-08 LAB — MAGNESIUM: Magnesium: 2.1 mg/dL (ref 1.7–2.4)

## 2022-01-08 LAB — PHOSPHORUS: Phosphorus: 3.5 mg/dL (ref 2.5–4.6)

## 2022-01-08 MED ORDER — DEXAMETHASONE 4 MG PO TABS: 4.0000 mg | ORAL_TABLET | Freq: Every day | ORAL | Status: DC

## 2022-01-08 MED ORDER — POLYETHYLENE GLYCOL 3350 17 G PO PACK: 17.0000 g | PACK | Freq: Every day | ORAL | Status: DC | PRN

## 2022-01-08 MED ORDER — PANTOPRAZOLE SODIUM 40 MG PO TBEC
40.0000 mg | DELAYED_RELEASE_TABLET | Freq: Two times a day (BID) | ORAL | Status: DC
  Administered 2022-01-08 – 2022-01-18 (×20): 40 mg via ORAL
  Filled 2022-01-08 (×20): qty 1

## 2022-01-08 MED ORDER — DEXAMETHASONE 2 MG PO TABS
2.0000 mg | ORAL_TABLET | Freq: Every day | ORAL | Status: DC
Start: 2022-01-15 — End: 2022-01-10

## 2022-01-08 MED ORDER — DEXAMETHASONE 6 MG PO TABS
6.0000 mg | ORAL_TABLET | Freq: Every day | ORAL | Status: DC
  Administered 2022-01-09 – 2022-01-10 (×2): 6 mg via ORAL
  Filled 2022-01-08 (×2): qty 1

## 2022-01-08 MED ORDER — IOHEXOL 350 MG/ML SOLN
64.0000 mL | Freq: Once | INTRAVENOUS | Status: AC | PRN
  Administered 2022-01-08: 64 mL via INTRAVENOUS

## 2022-01-08 NOTE — Progress Notes (Signed)
Inpatient Rehabilitation Admissions Coordinator   [facility] RN CM is pursuing approval with [facility] for possible [facility] admit. I will follow at a distance as he remains on airborne precautions.  Danne Baxter, RN, MSN Rehab Admissions Coordinator [phone] 01/08/2022 10:07 AM

## 2022-01-08 NOTE — Progress Notes (Signed)
Physical Therapy Treatment Patient Details Name: Jeff Willis MRN: [id number] DOB: 07/30/1940 Today's Date: 01/08/2022   History of Present Illness Pt is an 81 y.o. male admitted 12/30/21 with hypoxia and failure to thrive after returning home from flight to [place] on same day; of note, pt with recent COVID+ test 12/26/21 while on cruise. Workup for IPF flare secondary to COVID and rhinovirus. ETT 9/5-9/8. PMH includes esophageal stenosis, PAD, GERD, spinal stenosis.   PT Comments    Pt progressing with mobility. Today's session focused on repeated bouts of standing activity for improving strength and activity tolerance; pt requires frequent seated rest breaks to recover fatigue and SOB. Pt motivated to participate; family present and supportive. Pt remains limited by generalized weakness, decreased activity tolerance, and impaired balance strategies/postural reactions. Continue to recommend intensive AIR-level therapies to maximize functional mobility and independence prior to return home.  SpO2 down to 86% on 4L O2 [place]    Recommendations for follow up therapy are one component of a multi-disciplinary discharge planning process, led by the attending physician.  Recommendations may be updated based on patient status, additional functional criteria and insurance authorization.  Follow Up Recommendations  Acute inpatient rehab (3hours/day)     Assistance Recommended at Discharge Frequent or constant Supervision/Assistance  Patient can return home with the following A lot of help with walking and/or transfers;A lot of help with bathing/dressing/bathroom;Assistance with cooking/housework;Assist for transportation;Help with stairs or ramp for entrance   Equipment Recommendations   (TBD)    Recommendations for Other Services  Mobility Specialist     Precautions / Restrictions Precautions Precautions: Fall;Other (comment) Precaution Comments: Watch SpO2 (does not wear O2 baseline); urine  incontinence (has Primofit) Restrictions Weight Bearing Restrictions: No     Mobility  Bed Mobility Overal bed mobility: Needs Assistance Bed Mobility: Rolling, Sit to Supine, Sidelying to Sit Rolling: Modified independent (Device/Increase time) Sidelying to sit: Min assist   Sit to supine: Supervision   General bed mobility comments: pt rolling self onto L side with use of bed rail; minA for HHA to elevate trunk to sit; return to supine without assist    Transfers Overall transfer level: Needs assistance Equipment used: Rolling walker (2 wheels) Transfers: Sit to/from Stand Sit to Stand: Min assist, Mod assist           General transfer comment: multiple sit<>stands from EOB to RW this session, initial minA for trunk elevation and stability, regressing to modA with fatigue; initial cues for hand placement, pt with good carryover on subsequent trials    Ambulation/Gait Ambulation/Gait assistance: Min assist Gait Distance (Feet): 8 Feet Assistive device: Rolling walker (2 wheels) Gait Pattern/deviations: Step-through pattern, Decreased stride length, Shuffle, Trunk flexed Gait velocity: Decreased     General Gait Details: slow, fatigued, mildly unsteady gait with RW and intermittent minA for stability and RW management, especially with backwards steps; pt performing multiple pre-gait/standing therex, then walking ~4' forwards & 4' backwards; quick to fatigue with standing activity requiring multiple seated rest breaks   Stairs             Wheelchair Mobility    Modified Rankin (Stroke Patients Only)       Balance Overall balance assessment: Needs assistance Sitting-balance support: Feet supported Sitting balance-Leahy Scale: Fair Sitting balance - Comments: able to don bilateral socks sitting EOB with increased time   Standing balance support: Bilateral upper extremity supported, Reliant on assistive device for balance Standing balance-Leahy Scale: Poor  Cognition Arousal/Alertness: Awake/alert Behavior During Therapy: WFL for tasks assessed/performed Overall Cognitive Status: Within Functional Limits for tasks assessed                                 General Comments: pleasant and cooperative; less verbally interactive with increased fatigue and SOB        Exercises Other Exercises Other Exercises: 4x bouts of standing activity w/ prolonged seated rest breaks to recover between rounds: 1) static standing, marching in place ~15 sec; 2) steps forwards/backwards; 3) static standing without UE support, attempts at steps forwards without RW but pt preference for UE support; 4) 5x repeated sit<>stand from bed to RW, reliant on UE support and min-modA    General Comments General comments (skin integrity, edema, etc.): pt's family present and supportive. SpO2 down to 86% on 4L O2 [place] with activity, HR 99; return to >/90% with seated rest, cues for pursed lip breathing      Pertinent Vitals/Pain Pain Assessment Pain Assessment: No/denies pain Pain Intervention(s): Monitored during session    Home Living                          Prior Function            PT Goals (current goals can now be found in the care plan section) Progress towards PT goals: Progressing toward goals    Frequency    Min 3X/week      PT Plan Current plan remains appropriate    Co-evaluation              AM-PAC PT "6 Clicks" Mobility   Outcome Measure  Help needed turning from your back to your side while in a flat bed without using bedrails?: A Little Help needed moving from lying on your back to sitting on the side of a flat bed without using bedrails?: A Little Help needed moving to and from a bed to a chair (including a wheelchair)?: A Little Help needed standing up from a chair using your arms (e.g., wheelchair or bedside chair)?: A Little Help needed to walk in hospital room?:  Total Help needed climbing 3-5 steps with a railing? : Total 6 Click Score: 14    End of Session Equipment Utilized During Treatment: Gait belt;Oxygen Activity Tolerance: Patient tolerated treatment well Patient left: in bed;with call bell/phone within reach;with family/visitor present Nurse Communication: Mobility status PT Visit Diagnosis: Unsteadiness on feet (R26.81);Muscle weakness (generalized) (M62.81)     Time: 8101-7510 PT Time Calculation (min) (ACUTE ONLY): 26 min  Charges:  $Therapeutic Exercise: 23-37 mins                     Mabeline Caras, PT, DPT Acute Rehabilitation Services  Personal: [facility] Office: [place] 01/08/2022, 4:12 PM

## 2022-01-08 NOTE — Progress Notes (Addendum)
PROGRESS NOTE    Jeff Willis  LZJ:[id number] DOB: 1941/04/17 DOA: 12/30/2021 PCP: Gaynelle Arabian, MD   Brief Narrative: 81 year old with past medical history significant for known esophageal stenosis status post dilation April 2023, IPF on Esbriet (FVC 84% and DLCO 45 % May 2023) patient flew to [place] for a cruise, on 12/24/2021 she developed fever cough.  On 80/30/2023 on the cruise she was diagnosed with COVID-19 infection.  She declined Paxlovid at that time, she had previously experienced side effects from it.  She developed cough, failure to thrive, return to [place] via flight 12/30/2021.  Pulse ox was checked and she was found to be hypoxic presented via EMS.  COVID PCR positive.  CT chest with contrast negative for PE. -She was admitted to the hospital 12/30/2021 with acute hypoxic respiratory failure secondary to COVID-19.   -9/4 He was placed on BiPAP and transferred to the ICU.   -9/5 He was intubated due to respiratory failure.   -9/8 He was extubated.    Assessment & Plan:   Principal Problem:   Acute respiratory failure with hypoxia (HCC) Active Problems:   Interstitial lung disease ([place])   COVID-19 virus infection   Hyponatremia   History of sinus tachycardia   Pneumonia due to COVID-19 virus   Protein-calorie malnutrition, severe   Pressure injury of skin  1-Acute Hypoxic respiratory failure IPF flare in the setting of COVID-19 and rhinovirus -Patient on admission placed on 3 L of oxygen subsequently 9/5 developed worsening respiratory failure requiring intubation. -Postintubation required high flow nasal cannula 12 L---\down to 3 L.  -Continue with Dexamethasone. He will complete Dexamethasone 10 days for Covid tomorrow, will do taper steroids for spondylosis flare.  -He completed 5 days antibiotics.  -Oxygen requirement down to 3 L.  -Received one time dose lasix 9/09. -Continue with flutter valve, PRN Mucinex.  -Patient needs to be on airborne  precaution for covid for 21 days from 8/30 (Covid test perform at the  cruise) . Off isolation 9/21. CM making referral to other Inpatient rehab. [facility] inpatient rehab send clinical information to insurance. Awaiting decision from insurance company.   Tachycardia/A-fib Hypertension Demand ischemia -Continue with metoprolol -PRN hydralazine.  -HR well controlled.  -Discussed with Son, patient respiratory decompensation was sudden, when he required intubation, his HR was elevated. Because of those event, and tachycardia during hospitalization  and elevated D dimer, and persistent hypoxia I will proceed with ordering CT angio to rule out PE>   Diabetes: Levemir 6 Unit BID.  SSI>  CBG well controlled.   Known esophageal dilation April 2023 History of dysphagia for solid and spices PPI-- change to BID.  MBS: No evidence of aspiration,  A 13 mm pill  lodged briefly in the vallecular space, then above the UES.  It passed through the UES into the cervical esophagus, then returned to the hypopharynx.  A pureed bolus helped pass it through the esophagus without incident/  Patient to follow regular diet but to avoid large bolus of food, hard meat, should eat soft food.  He relates food has been going down fine. Plan to monitor for worsening symptoms.    Anemia of chronic disease;  Hb stable.   Severe protein caloric malnutrition due to acute illness Failure to thrive due to acute illness, weight loss from IPF -BSM: Diet advanced to regular, soft, avoid meat, bread or large amount of bolus.  -Nutritionist consulted.   Back pain: acute on chronic. Spondylosis.  -He has try before  gabapentin 600 mg TID, but  made him feel weird.  -Started low dose gabapentin 100 mg TID 9/10. Continue with current dose. Pain is getting better. Could reassess and increase gabapentin dos in 2-5 days depending on symptoms.   -Continue with oxycodone PRN> only required two doses yesterday/  -MRI Lumbar Spine: 1.  L5-S1 right paracentral disc extrusion with caudal migration, new from the prior exam, which effaces the right lateral recess and contacts the descending right S1 nerve roots. Edema and enhancement at the superior aspect of L4, which appears concentrated about the superior endplate, with edema in the intervertebral disc, and a smaller focus of similar abnormal signal at the inferior endplate of L3. These findings are nonspecific and can be seen in the setting of degenerative changes/acute Schmorl's node formation, which can be painful, although early discitis-osteomyelitis can appear similar. L3-L4 mild spinal canal stenosis, unchanged. L4-L5 and L5-S1 mild bilateral neural foraminal narrowing, unchanged. Neurosurgery consulted, they think MRI finding consistent with degenerative spondylosis throughout spine. No need for acute intervention.  Blood culture: no growth to date/ Blood culture from admission negative. Recent ESR normal, CRP normal. Inflammatory marker initially elevate on admission likely related to covid.  -Plan to do dexamethasone taper dose.   Elevated BP; could be related to steroids, and pain.  PRN hydralazine  Elevated D dimer; in setting of covid. Doppler LE negative for DVT>  On Lovenox. Level decreasing.   See wound care documentation below.  Pressure Injury 01/05/22 Buttocks Right Stage 2 -  Partial thickness loss of dermis presenting as a shallow open injury with a red, pink wound bed without slough. (Active)  01/05/22 2130  Location: Buttocks  Location Orientation: Right  Staging: Stage 2 -  Partial thickness loss of dermis presenting as a shallow open injury with a red, pink wound bed without slough.  Wound Description (Comments):   Present on Admission:   Dressing Type Foam - Lift dressing to assess site every shift 01/07/22 2000     Nutrition Problem: Severe Malnutrition Etiology: chronic illness    Signs/Symptoms: severe muscle depletion, severe fat  depletion    Interventions: Prostat, Tube feeding  Estimated body mass index is 20.89 kg/m as calculated from the following:   Height as of this encounter: 5\' 6"  (1.676 m).   Weight as of this encounter: 58.7 kg.   DVT prophylaxis: Lovenox Code Status: Full code Family Communication: Son at bedside, and over phone during rounds.  Disposition Plan:  Status is: Inpatient Remains inpatient appropriate because: management resp failure    Consultants:  CCM  Procedures:  ECHO;   Antimicrobials:    Subjective: He report improvement of back pain, pain down to 2-3  He is passing gas. No BM yet.  He report food is going down without difficulty. We talk about eating soft food.  He is breathing better, denies worsening cough.  He is eager to start rehab,. I think he will benefit from inpatient rehab  Objective: Vitals:   01/08/22 0052 01/08/22 0614 01/08/22 0814 01/08/22 1200  BP: (!) 145/78 (!) 144/81  (!) 168/87  Pulse: 83 74  70  Resp: 17 19 20  (!) 24  Temp: 98.8 F (37.1 C) 98 F (36.7 C)  97.9 F (36.6 C)  TempSrc: Oral Oral  Oral  SpO2: 100% 100%  100%  Weight:  58.7 kg    Height:        Intake/Output Summary (Last 24 hours) at 01/08/2022 1203 Last data filed at 01/08/2022 [phone]  Gross per 24 hour  Intake 120 ml  Output 1050 ml  Net -930 ml    Filed Weights   01/06/22 0500 01/07/22 0500 01/08/22 0614  Weight: 68 kg 57 kg 58.7 kg    Examination:  General exam: NAD Respiratory system: Crackles bases Cardiovascular system: S 1, S 2 RRR Gastrointestinal system:BS present, soft, nt Central nervous system: Moves 4 extremities Extremities: no edema  Data Reviewed: I have personally reviewed following labs and imaging studies  CBC: Recent Labs  Lab 01/04/22 0320 01/05/22 0500 01/06/22 0549 01/07/22 0500 01/08/22 0433  WBC 13.5* 16.8* 13.9* 13.1* 11.2*  NEUTROABS 11.7* 13.8* 11.5* 10.8* 9.1*  HGB 10.8* 13.4 12.2* 11.8* 12.2*  HCT 31.0* 38.2* 35.8*  36.0* 34.3*  MCV 94.8 93.9 95.7 97.8 93.2  PLT 208 298 280 257 030    Basic Metabolic Panel: Recent Labs  Lab 01/03/22 0344 01/03/22 1245 01/03/22 1525 01/04/22 0320 01/05/22 0500 01/06/22 0549 01/07/22 0500 01/08/22 0433  NA 134* 137 138 140 139  --   --  136  K 5.2* 4.8 4.9 4.7 4.3  --   --  4.1  CL 101 106  --  104 102  --   --  102  CO2 28 27  --  28 26  --   --  31  GLUCOSE 222* 203*  --  90 151*  --   --  97  BUN 24* 29*  --  31* 45*  --   --  36*  CREATININE 0.82 0.90  --  0.83 1.22  --   --  0.93  CALCIUM 8.2* 8.0*  --  8.0* 8.5*  --   --  8.8*  MG 2.4  --   --  2.3 2.6* 2.4 2.5* 2.1  PHOS 2.8  --   --  2.5 4.8* 4.8* 4.1 3.5    GFR: Estimated Creatinine Clearance: 52.6 mL/min (by C-G formula based on SCr of 0.93 mg/dL). Liver Function Tests: Recent Labs  Lab 01/02/22 0355 01/03/22 0344 01/04/22 0320 01/05/22 0500  AST 39 19 36 38  ALT 39 34 50* 56*  ALKPHOS 66 73 60 68  BILITOT 0.2* 0.4 0.5 0.8  PROT 4.6* 4.9* 4.5* 5.6*  ALBUMIN 1.7* 1.9* 1.8* 2.5*    No results for input(s): "LIPASE", "AMYLASE" in the last 168 hours. No results for input(s): "AMMONIA" in the last 168 hours. Coagulation Profile: No results for input(s): "INR", "PROTIME" in the last 168 hours.  Cardiac Enzymes: No results for input(s): "CKTOTAL", "CKMB", "CKMBINDEX", "TROPONINI" in the last 168 hours. BNP (last 3 results) Recent Labs    09/07/21 1218  PROBNP 211.0*    HbA1C: No results for input(s): "HGBA1C" in the last 72 hours. CBG: Recent Labs  Lab 01/07/22 0802 01/07/22 1131 01/07/22 1555 01/07/22 2225 01/08/22 0823  GLUCAP 119* 121* 237* 147* 93    Lipid Profile: Recent Labs    01/07/22 0500  TRIG 70    Thyroid Function Tests: No results for input(s): "TSH", "T4TOTAL", "FREET4", "T3FREE", "THYROIDAB" in the last 72 hours. Anemia Panel: Recent Labs    01/06/22 0549 01/07/22 0500  FERRITIN 108 123    Sepsis Labs: Recent Labs  Lab 01/01/22 1800  01/01/22 2035  LATICACIDVEN 1.9 1.8     Recent Results (from the past 240 hour(s))  Urine Culture     Status: None   Collection Time: 12/30/21 12:47 AM   Specimen: In/Out Cath Urine  Result Value Ref Range Status  Specimen Description IN/OUT CATH URINE  Final   Special Requests NONE  Final   Culture   Final    NO GROWTH Performed at [facility], [place] [address], [place], [place] [address]    Report Status 01/01/2022 FINAL  Final  Resp Panel by RT-PCR (Flu A&B, Covid) Anterior Nasal Swab     Status: Abnormal   Collection Time: 12/30/21 10:35 PM   Specimen: Anterior Nasal Swab  Result Value Ref Range Status   SARS Coronavirus 2 by RT PCR POSITIVE (A) NEGATIVE Final    Comment: (NOTE) SARS-CoV-2 target nucleic acids are DETECTED.  The SARS-CoV-2 RNA is generally detectable in upper respiratory specimens during the acute phase of infection. Positive results are indicative of the presence of the identified virus, but do not rule out bacterial infection or co-infection with other pathogens not detected by the test. Clinical correlation with patient history and other diagnostic information is necessary to determine patient infection status. The expected result is Negative.  Fact Sheet for Patients: EntrepreneurPulse.com.au  Fact Sheet for Healthcare Providers: IncredibleEmployment.be  This test is not yet approved or cleared by the [place] FDA and  has been authorized for detection and/or diagnosis of SARS-CoV-2 by FDA under an Emergency Use Authorization (EUA).  This EUA will remain in effect (meaning this test can be used) for the duration of  the COVID-19 declaration under Section 564(b)(1) of the A ct, 21 U.S.C. section 360bbb-3(b)(1), unless the authorization is terminated or revoked sooner.     Influenza A by PCR NEGATIVE NEGATIVE Final   Influenza B by PCR NEGATIVE NEGATIVE Final    Comment: (NOTE) The Xpert Xpress  SARS-CoV-2/FLU/RSV plus assay is intended as an aid in the diagnosis of influenza from Nasopharyngeal swab specimens and should not be used as a sole basis for treatment. Nasal washings and aspirates are unacceptable for Xpert Xpress SARS-CoV-2/FLU/RSV testing.  Fact Sheet for Patients: EntrepreneurPulse.com.au  Fact Sheet for Healthcare Providers: IncredibleEmployment.be  This test is not yet approved or cleared by the [place] FDA and has been authorized for detection and/or diagnosis of SARS-CoV-2 by FDA under an Emergency Use Authorization (EUA). This EUA will remain in effect (meaning this test can be used) for the duration of the COVID-19 declaration under Section 564(b)(1) of the Act, 21 U.S.C. section 360bbb-3(b)(1), unless the authorization is terminated or revoked.  Performed at [facility], [place] [address] [place], [place] [address]   Blood Culture (routine x 2)     Status: None   Collection Time: 12/30/21 10:35 PM   Specimen: BLOOD RIGHT FOREARM  Result Value Ref Range Status   Specimen Description BLOOD RIGHT FOREARM  Final   Special Requests   Final    BOTTLES DRAWN AEROBIC AND ANAEROBIC Blood Culture adequate volume   Culture   Final    NO GROWTH 5 DAYS Performed at [facility], [place] [address], [place],  [address]    Report Status 01/04/2022 FINAL  Final  Respiratory (~20 pathogens) panel by PCR     Status: Abnormal   Collection Time: 12/30/21 10:35 PM   Specimen: Nasopharyngeal Swab; Respiratory  Result Value Ref Range Status   Adenovirus NOT DETECTED NOT DETECTED Final   Coronavirus 229E NOT DETECTED NOT DETECTED Final    Comment: (NOTE) The Coronavirus on the Respiratory Panel, DOES NOT test for the novel  Coronavirus (2019 nCoV)    Coronavirus HKU1 NOT DETECTED NOT DETECTED Final   Coronavirus NL63  NOT DETECTED NOT DETECTED Final   Coronavirus OC43 NOT DETECTED NOT DETECTED Final    Metapneumovirus NOT DETECTED NOT DETECTED Final   Rhinovirus / Enterovirus DETECTED (A) NOT DETECTED Final   Influenza A NOT DETECTED NOT DETECTED Final   Influenza B NOT DETECTED NOT DETECTED Final   Parainfluenza Virus 1 NOT DETECTED NOT DETECTED Final   Parainfluenza Virus 2 NOT DETECTED NOT DETECTED Final   Parainfluenza Virus 3 NOT DETECTED NOT DETECTED Final   Parainfluenza Virus 4 NOT DETECTED NOT DETECTED Final   Respiratory Syncytial Virus NOT DETECTED NOT DETECTED Final   Bordetella pertussis NOT DETECTED NOT DETECTED Final   Bordetella Parapertussis NOT DETECTED NOT DETECTED Final   Chlamydophila pneumoniae NOT DETECTED NOT DETECTED Final   Mycoplasma pneumoniae NOT DETECTED NOT DETECTED Final    Comment: Performed at [facility], [place] [address] [place], [name] [address]  Blood Culture (routine x 2)     Status: None   Collection Time: 12/30/21 10:48 PM   Specimen: BLOOD LEFT FOREARM  Result Value Ref Range Status   Specimen Description BLOOD LEFT FOREARM  Final   Special Requests   Final    BOTTLES DRAWN AEROBIC AND ANAEROBIC Blood Culture adequate volume   Culture   Final    NO GROWTH 5 DAYS Performed at [facility], [place] [address] [place], [place] [address]    Report Status 01/04/2022 FINAL  Final  Culture, blood (Routine X 2) w Reflex to ID Panel     Status: None (Preliminary result)   Collection Time: 01/07/22  9:08 AM   Specimen: BLOOD LEFT ARM  Result Value Ref Range Status   Specimen Description BLOOD LEFT ARM  Final   Special Requests   Final    BOTTLES DRAWN AEROBIC AND ANAEROBIC Blood Culture adequate volume   Culture   Final    NO GROWTH < 24 HOURS Performed at [facility], [place] [address], [place], [place] [address]    Report Status PENDING  Incomplete  Culture, blood (Routine X 2) w Reflex to ID Panel     Status: None (Preliminary result)   Collection Time: 01/07/22  9:08 AM   Specimen: BLOOD LEFT HAND  Result Value Ref Range  Status   Specimen Description BLOOD LEFT HAND  Final   Special Requests   Final    BOTTLES DRAWN AEROBIC AND ANAEROBIC Blood Culture adequate volume   Culture   Final    NO GROWTH < 24 HOURS Performed at [facility], [place] [address], [place], [place] [address]    Report Status PENDING  Incomplete         Radiology Studies: DG Swallowing Func-Speech Pathology  Result Date: 01/07/2022 Table formatting from the original result was not included. Images from the original result were not included. Objective Swallowing Evaluation: Type of Study: MBS-Modified Barium Swallow Study  Patient Details Name: BRADD MERLOS MRN: [id number] Date of Birth: [birth year] Today's Date: 01/07/2022 Time: SLP Start Time (ACUTE ONLY): 1408 -SLP Stop Time (ACUTE ONLY): 1425 SLP Time Calculation (min) (ACUTE ONLY): 17 min Past Medical History: Past Medical History: Diagnosis Date  Back pain   Elevated PSA   Essential tremor   Hypercholesteremia   Neuropathy   Prostate cancer ([place])   Sinus tachycardia 01/30/2018 Past Surgical History: Past Surgical History: Procedure Laterality Date  COLONOSCOPY WITH PROPOFOL N/A 08/10/2021  Procedure: COLONOSCOPY WITH PROPOFOL;  Surgeon: Carol Ada, MD;  Location: [facility];  Service: Gastroenterology;  Laterality: N/A;  ESOPHAGEAL DILATION  08/10/2021  Procedure: ESOPHAGEAL DILATION;  Surgeon: Jeani Hawking, MD;  Location: [facility];  Service: Gastroenterology;;  ESOPHAGOGASTRODUODENOSCOPY (EGD) WITH PROPOFOL N/A 08/10/2021  Procedure: ESOPHAGOGASTRODUODENOSCOPY (EGD) WITH PROPOFOL;  Surgeon: Jeani Hawking, MD;  Location: [facility];  Service: Gastroenterology;  Laterality: N/A;  HEMOSTASIS CLIP PLACEMENT  08/10/2021  Procedure: HEMOSTASIS CLIP PLACEMENT;  Surgeon: Jeani Hawking, MD;  Location: [facility];  Service: Gastroenterology;;  LUMBAR MICRODISCECTOMY    L4-5  POLYPECTOMY  08/10/2021  Procedure: POLYPECTOMY;  Surgeon: Jeani Hawking, MD;  Location: [facility];   Service: Gastroenterology;;  RADIOACTIVE SEED IMPLANT N/A 02/24/2018  Procedure: RADIOACTIVE SEED IMPLANT/BRACHYTHERAPY IMPLANT;  Surgeon: Jerilee Field, MD;  Location: [facility];  Service: Urology;  Laterality: N/A;  RIGHT HEART CATH N/A 11/22/2021  Procedure: RIGHT HEART CATH;  Surgeon: Lennette Bihari, MD;  Location: [facility] INVASIVE CV LAB;  Service: Cardiovascular;  Laterality: N/A;  SPACE OAR INSTILLATION N/A 02/24/2018  Procedure: SPACE OAR INSTILLATION;  Surgeon: Jerilee Field, MD;  Location: [facility];  Service: Urology;  Laterality: N/A; HPI: Jahkai G Kiernan presented to [facility] ED on 12/30/21 with hypoxemia, fever, and cough that started on 8/28.  Dx'd with IPF flare in setting of COVID 19 and rhinovirus. Respiratory distress with intubation 9/5-8. PMHx of esophageal stenosis s/p dilation 08/09/2021. There was one benign-appearing, intrinsic mild stenosis found at the cricopharyngeus as well as multiple strictures at the UES. Esophagram 08/03/21: suspected cervical web, mild esophageal dysmotility, presbyesophagus, mild vallecular retention. PMHx includes idiopathic pulmonary fibrosis, essential tremor, neuropathy. Supportive family.  Subjective: alert and conversational  Recommendations for follow up therapy are one component of a multi-disciplinary discharge planning process, led by the attending physician.  Recommendations may be updated based on patient status, additional functional criteria and insurance authorization. Assessment / Plan / Recommendation   01/07/2022   1:00 PM Clinical Impressions Clinical Impression Mr.Umbarger demonstrated a primary esophageal dysphagia and mild pharyngeal dysphagia.  Oral phase was WNL- pt tended to piecemeal solid boluses into pharynx, likely a behavioral strategy he has developed out of caution.  There was decreased base of tongue-to-pharyngeal wall compression, leading to residue that remained in the valleculae (solids >liquids)  but cleared with secondary dry swallows. Laryngeal vestibule closure was reliable and consistent with no observed penetration nor aspiration.  A 13 mm pill  lodged briefly in the vallecular space, then above the UES.  It passed through the UES into the cervical esophagus, then returned to the hypopharynx.  A pureed bolus helped pass it through the esophagus without incident  The pt viewed the MBS in real time and we discussed the findings.  Recommend advancing diet to regular consistencies (avoid tough meats/breads or large boluses that may not pass through UES); continue thin liquds.  Crush pills >10 mm when possible. Pt will f/u with GI to determine if another dilation is warranted.  No further SLP f/u is needed - our service will sign off. SLP Visit Diagnosis Dysphagia, pharyngoesophageal phase (R13.14) Impact on safety and function No limitations     01/07/2022   1:00 PM Treatment Recommendations Treatment Recommendations No treatment recommended at this time     01/05/2022  10:00 AM Prognosis Prognosis for Safe Diet Advancement Good   01/07/2022   1:00 PM Diet Recommendations SLP Diet Recommendations Thin liquid;Regular solids Liquid Administration via Cup;Straw Medication Administration Crushed with puree Postural Changes Remain semi-upright after after feeds/meals (Comment)     01/07/2022  1:00 PM Other Recommendations Recommended Consults Consider esophageal assessment Oral Care Recommendations Oral care BID Follow Up Recommendations No SLP follow up Assistance recommended at discharge Intermittent Supervision/Assistance   01/05/2022  10:00 AM Frequency and Duration  Speech Therapy Frequency (ACUTE ONLY) min 2x/week Treatment Duration 2 weeks      No data to display       01/07/2022   1:00 PM Pharyngeal Phase Pharyngeal Phase Impaired Pharyngeal- Thin Straw Delayed swallow initiation-pyriform sinuses;Delayed swallow initiation-vallecula;Reduced tongue base retraction;Pharyngeal residue - valleculae Pharyngeal-  Puree Delayed swallow initiation-vallecula;Reduced tongue base retraction;Pharyngeal residue - valleculae Pharyngeal- Regular Delayed swallow initiation-vallecula;Reduced tongue base retraction;Pharyngeal residue - valleculae    01/07/2022   1:00 PM Cervical Esophageal Phase  Cervical Esophageal Phase Impaired Juan Quam Laurice 01/07/2022, 3:07 PM                     MR Lumbar Spine W Wo Contrast  Result Date: 01/06/2022 CLINICAL DATA:  Low back pain; history of laminectomy EXAM: MRI LUMBAR SPINE WITHOUT AND WITH CONTRAST TECHNIQUE: Multiplanar and multiecho pulse sequences of the lumbar spine were obtained without and with intravenous contrast. CONTRAST:  54mL GADAVIST GADOBUTROL 1 MMOL/ML IV SOLN COMPARISON:  06/22/2016 MRI lumbar spine FINDINGS: Segmentation:  5 lumbar-type vertebral bodies. Alignment: Reversal of the normal lumbar lordosis. Mild lumbar levocurvature. Trace retrolisthesis L4 on L5. Vertebrae: No acute fracture or suspicious osseous lesions. Increased T2 signal and contrast enhancement at the superior aspect of L4, which appears concentrated around the superior endplate, with increased T2 signal in the adjacent disc (series 4, image 10 and series 8, image 10). No abnormal enhancement within the disc. Small focus of similar signal at the inferior endplate of L3. Conus medullaris and cauda equina: Conus extends to the L1 level. Conus and cauda equina appear normal. No abnormal enhancement. Paraspinal and other soft tissues: Atrophy of the right-greater-than-left inferior paraspinous muscles. Disc levels: T12-L1: No significant disc bulge. No spinal canal stenosis or neural foraminal narrowing. L1-L2: Minimal disc bulge. Mild facet arthropathy. No spinal canal stenosis or neural foraminal narrowing. L2-L3: Minimal disc bulge. Mild facet arthropathy. No spinal canal stenosis or neural foraminal narrowing. L3-L4: Mild disc bulge. Mild-to-moderate facet arthropathy. Narrowing of the lateral  recesses, which could affect the descending L4 nerve roots. Mild spinal canal stenosis, unchanged. No neural foraminal narrowing. L4-L5: Interval right laminectomy. Trace retrolisthesis and mild disc bulge. Moderate facet arthropathy. No residual spinal canal stenosis. Narrowing of the lateral recesses, which could affect the descending L5 nerve roots. Mild bilateral neural foraminal narrowing, unchanged. L5-S1: Mild disc bulge with right paracentral disc extrusion with caudal migration, new from the prior exam, which effaces the right lateral recess and contacts the descending right S1 nerve roots (series 6, image 41). Moderate to severe facet arthropathy. Narrowing of the lateral recesses, which could compress the descending S1 nerve roots. No spinal canal stenosis. Mild bilateral neural foraminal narrowing, unchanged. IMPRESSION: 1. L5-S1 right paracentral disc extrusion with caudal migration, new from the prior exam, which effaces the right lateral recess and contacts the descending right S1 nerve roots. 2. Edema and enhancement at the superior aspect of L4, which appears concentrated about the superior endplate, with edema in the intervertebral disc, and a smaller focus of similar abnormal signal at the inferior endplate of L3. These findings are nonspecific and can be seen in the setting of degenerative changes/acute Schmorl's node formation, which can be painful, although early discitis-osteomyelitis can appear similar. Correlate with inflammatory markers.  3. L3-L4 mild spinal canal stenosis, unchanged. 4. L4-L5 and L5-S1 mild bilateral neural foraminal narrowing, unchanged. Electronically Signed   By: Merilyn Baba M.D.   On: 01/06/2022 21:04        Scheduled Meds:  ascorbic acid  500 mg Oral Daily   aspirin  81 mg Oral Daily   [START ON 01/09/2022] dexamethasone  6 mg Oral Daily   Followed by   Derrill Memo ON 01/12/2022] dexamethasone  4 mg Oral Daily   Followed by   Derrill Memo ON 01/15/2022] dexamethasone   2 mg Oral Daily   enoxaparin (LOVENOX) injection  40 mg Subcutaneous Q24H   feeding supplement  237 mL Oral BID BM   gabapentin  100 mg Oral TID   insulin aspart  0-9 Units Subcutaneous TID WC   insulin detemir  6 Units Subcutaneous Q12H   lidocaine  1 patch Transdermal Q24H   metoprolol tartrate  12.5 mg Oral BID   pantoprazole  40 mg Oral BID   polyethylene glycol  17 g Oral Daily   rosuvastatin  10 mg Oral QHS   senna  2 tablet Oral Daily   sodium chloride flush  10-40 mL Intracatheter Q12H   zinc sulfate  220 mg Oral Daily   Continuous Infusions:  sodium chloride Stopped (01/01/22 0609)     LOS: 8 days    Time spent: 35 Minutes.     Elmarie Shiley, MD [facility]   If 7PM-7AM, please contact night-coverage [email or web address]  01/08/2022, 12:03 PM

## 2022-01-08 NOTE — TOC Progression Note (Signed)
Transition of Care [facility]) - Progression Note    Patient Details  Name: Jeff Willis MRN: [id number] Date of Birth: [birth year]  Transition of Care [facility]) CM/SW Contact  Graves-Bigelow, Ocie Cornfield, RN Phone Number: 01/08/2022, 11:50 AM  Clinical Narrative: Case Manager received notification from [place] for [facility] [phone]. Crystal has submitted clinicals to insurance to see if they will approve rehab. Address to [facility] is [place]. Case Manager spoke with provider regarding above information and will reach out the son [name]. Case Manager will continue to follow for additional transition of care needs as he progresses.   Expected Discharge Plan: IP Rehab Facility Barriers to Discharge: Continued Medical Work up  Expected Discharge Plan and Services Expected Discharge Plan: [place] In-house Referral: NA Discharge Planning Services: CM Consult Post Acute Care Choice: IP Rehab Living arrangements for the past 2 months: Single Family Home                   DME Agency: NA     Readmission Risk Interventions    01/07/2022    4:36 PM  Readmission Risk Prevention Plan  Transportation Screening Complete  PCP or Specialist Appt within 3-5 Days Complete  HRI or Home Care Consult Complete  Social Work Consult for [place] Planning/Counseling Complete  Palliative Care Screening Not Applicable  Medication Review Press photographer) Referral to Pharmacy

## 2022-01-09 ENCOUNTER — Inpatient Hospital Stay (HOSPITAL_COMMUNITY): Payer: Medicare Other

## 2022-01-09 DIAGNOSIS — J9601 Acute respiratory failure with hypoxia: Secondary | ICD-10-CM | POA: Diagnosis not present

## 2022-01-09 DIAGNOSIS — E43 Unspecified severe protein-calorie malnutrition: Secondary | ICD-10-CM

## 2022-01-09 DIAGNOSIS — J849 Interstitial pulmonary disease, unspecified: Secondary | ICD-10-CM | POA: Diagnosis not present

## 2022-01-09 DIAGNOSIS — U071 COVID-19: Secondary | ICD-10-CM | POA: Diagnosis not present

## 2022-01-09 LAB — CBC WITH DIFFERENTIAL/PLATELET
Abs Immature Granulocytes: 0.07 10*3/uL (ref 0.00–0.07)
Basophils Absolute: 0 10*3/uL (ref 0.0–0.1)
Basophils Relative: 0 %
Eosinophils Absolute: 0.1 10*3/uL (ref 0.0–0.5)
Eosinophils Relative: 1 %
HCT: 33.7 % — ABNORMAL LOW (ref 39.0–52.0)
Hemoglobin: 11.8 g/dL — ABNORMAL LOW (ref 13.0–17.0)
Immature Granulocytes: 1 %
Lymphocytes Relative: 14 %
Lymphs Abs: 1.6 10*3/uL (ref 0.7–4.0)
MCH: 32.9 pg (ref 26.0–34.0)
MCHC: 35 g/dL (ref 30.0–36.0)
MCV: 93.9 fL (ref 80.0–100.0)
Monocytes Absolute: 1 10*3/uL (ref 0.1–1.0)
Monocytes Relative: 10 %
Neutro Abs: 8 10*3/uL — ABNORMAL HIGH (ref 1.7–7.7)
Neutrophils Relative %: 74 %
Platelets: 210 10*3/uL (ref 150–400)
RBC: 3.59 MIL/uL — ABNORMAL LOW (ref 4.22–5.81)
RDW: 12.8 % (ref 11.5–15.5)
WBC: 10.8 10*3/uL — ABNORMAL HIGH (ref 4.0–10.5)
nRBC: 0 % (ref 0.0–0.2)

## 2022-01-09 LAB — MAGNESIUM: Magnesium: 1.9 mg/dL (ref 1.7–2.4)

## 2022-01-09 LAB — GLUCOSE, CAPILLARY
Glucose-Capillary: 81 mg/dL (ref 70–99)
Glucose-Capillary: 196 mg/dL — ABNORMAL HIGH (ref 70–99)
Glucose-Capillary: 177 mg/dL — ABNORMAL HIGH (ref 70–99)
Glucose-Capillary: 260 mg/dL — ABNORMAL HIGH (ref 70–99)

## 2022-01-09 LAB — PHOSPHORUS: Phosphorus: 3.1 mg/dL (ref 2.5–4.6)

## 2022-01-09 MED ORDER — ADULT MULTIVITAMIN W/MINERALS CH
1.0000 | ORAL_TABLET | Freq: Every day | ORAL | Status: DC
  Administered 2022-01-09 – 2022-01-18 (×10): 1 via ORAL
  Filled 2022-01-09 (×11): qty 1

## 2022-01-09 MED ORDER — GLYCERIN (LAXATIVE) 2 G RE SUPP
1.0000 | Freq: Once | RECTAL | Status: AC
  Administered 2022-01-10: 1 via RECTAL
  Filled 2022-01-09: qty 1

## 2022-01-09 NOTE — TOC Progression Note (Signed)
Transition of Care [facility]) - Progression Note    Patient Details  Name: Jeff Willis MRN: [id number] Date of Birth: 01/19/1941  Transition of Care [facility]) CM/SW Contact  Graves-Bigelow, Ocie Cornfield, RN Phone Number: 01/09/2022, 1:39 PM  Clinical Narrative: Case Manager received a call from [facility] Coordinator with [facility] ([facility]); they are still awaiting insurance authorization. Case Manager will continue to follow for additional transition of care needs.   Expected Discharge Plan: IP Rehab Facility Barriers to Discharge: Continued Medical Work up  Expected Discharge Plan and Services Expected Discharge Plan: [place] In-house Referral: NA Discharge Planning Services: CM Consult Post Acute Care Choice: IP Rehab Living arrangements for the past 2 months: Single Family Home                   DME Agency: NA   Readmission Risk Interventions    01/07/2022    4:36 PM  Readmission Risk Prevention Plan  Transportation Screening Complete  PCP or Specialist Appt within 3-5 Days Complete  HRI or Home Care Consult Complete  Social Work Consult for [place] Planning/Counseling Complete  Palliative Care Screening Not Applicable  Medication Review Press photographer) Referral to Pharmacy

## 2022-01-09 NOTE — Care Management Important Message (Signed)
Important Message  Patient Details  Name: Jeff Willis MRN: [id number] Date of Birth: 02-19-1941   Medicare Important Message Given:  Yes     Shelda Altes 01/09/2022, 9:25 AM

## 2022-01-09 NOTE — Progress Notes (Signed)
Nutrition Follow-up  DOCUMENTATION CODES:   Severe malnutrition in context of chronic illness  INTERVENTION:   Continue Ensure Enlive po BID, each supplement provides 350 kcal and 20 grams of protein. Multivitamin w/ minerals daily Adjust diet to Vegetarian diet per pt preferences. Recommend increasing bowel regimen due to no BM x 5 days.  NUTRITION DIAGNOSIS:   Severe Malnutrition related to chronic illness as evidenced by severe muscle depletion, severe fat depletion. 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  9/08 Extubated 9/09 Diet advanced to Dysphagia 1, Thin Liquids 9/11 Diet advanced to Regular, thin liquids  Met with pt and family at bedside. Reports that pt is not eating a whole lot due to constipation. Denies any nausea or vomiting. Pt family requesting prune juice to help with constipation, RD to send on pt dinner tray.  Reports that he is drinking 1 ensure per day, discussed possibly drinking 2 per day since he has not been eating as well; all agreeable.  Meal completions: 15-50% x 5 meals   Medications reviewed and include: Vitamin C, Decadron, NovoLog SSI, Levemir, Protonix, Miralax, Senokot, Zinc sulfate Labs reviewed: BUN 36, 24 hr CBG 81-173   Diet Order:   Diet Order             Diet regular Room service appropriate? Yes with Assist; Fluid consistency: Thin  Diet effective now                  EDUCATION NEEDS:   No education needs have been identified at this time  Skin:  Skin Assessment: Skin Integrity Issues: Skin Integrity Issues:: Stage II Stage II:  buttocks  Last BM:  9/9  Height:  Ht Readings from Last 1 Encounters:  01/03/22 $RemoveB'5\' 6"'[id number]$  (1.676 m)   Weight:  Wt Readings from Last 1 Encounters:  01/09/22 56.1 kg   BMI:  Body mass index is 19.95 kg/m.  Estimated Nutritional Needs:  Kcal:  1700-1900 Protein:  90-110 grams Fluid:  >1.7 L/day   Hermina Barters RD, LDN Clinical Dietitian See [facility] for contact information.

## 2022-01-09 NOTE — Progress Notes (Signed)
PROGRESS NOTE    Jeff Willis  CBS:[id number] DOB: 1940-05-15 DOA: 12/30/2021 PCP: Gaynelle Arabian, MD   Brief Narrative: 81 year old with past medical history significant for known esophageal stenosis status post dilation April 2023, IPF on Esbriet (FVC 84% and DLCO 45 % May 2023) patient flew to [place] for a cruise, on 12/24/2021 developed fever cough.  On 12/26/2021 on the cruise he was diagnosed with COVID-19 infection, declined Paxlovid at that time, due to previously experienced side effects from it.  Continue to have cough, failure to thrive, returned to [place] via flight 12/30/2021.  Pulse ox was checked and was found to be hypoxic presented via EMS.  COVID PCR positive.  CT chest with contrast negative for PE, CTA chest neg for PE. Admitted to the hospital 12/30/2021 with acute hypoxic respiratory failure secondary to COVID-19. On 9/5 was intubated due to respiratory failure and extubated on 9/8. TRH assumed care.    Assessment & Plan:   Principal Problem:   Acute respiratory failure with hypoxia (HCC) Active Problems:   Interstitial lung disease ([place])   COVID-19 virus infection   Hyponatremia   History of sinus tachycardia   Pneumonia due to COVID-19 virus   Protein-calorie malnutrition, severe   Pressure injury of skin  Acute Hypoxic respiratory failure IPF flare in the setting of COVID-19 and rhinovirus Patient on admission placed on 3 L of oxygen subsequently 9/5 developed worsening respiratory failure requiring intubation, extubated on 9/8 CTA chest negative for PE Post-intubation required high flow nasal cannula 12 L---down to 2 L, plan to wean off Continue with tapered dose of dexamethasone Completed 5 days antibiotics.  Continue with flutter valve, PRN Mucinex.  Patient needs to be on airborne precaution for covid for 21 days from 8/30 (Covid test perform at the cruise) . Off isolation 9/21. CM making referral to other Inpatient rehab. [facility] inpatient re[facility]  send clinical information to insurance. Awaiting decision from insurance company  Abdominal distention/?Constipation Reports no BM for the past few days, passing gas Noted some distention, denies any significant pain Abdominal x-ray unremarkable, no bowel dilatation, no significant stool burden is noted Continue laxatives  Tachycardia/A-fib Hypertension Demand ischemia Continue with metoprolol PRN hydralazine.   Diabetes mellitus type 2 SSI, Accu-Cheks, hypoglycemic protocol  Known esophageal dilation April 2023 History of dysphagia for solid and spices PPI-- change to BID.  MBS: No evidence of aspiration,  A 13 mm pill  lodged briefly in the vallecular space, then above the UES.  It passed through the UES into the cervical esophagus, then returned to the hypopharynx.  A pureed bolus helped pass it through the esophagus without incident/  Patient to follow regular diet but to avoid large bolus of food, hard meat, should eat soft food.  He relates food has been going down fine. Plan to monitor for worsening symptoms.    Anemia of chronic disease Hb stable.   Severe protein caloric malnutrition due to acute illness Failure to thrive due to acute illness, weight loss from IPF -BSM: Diet advanced to regular, soft, avoid meat, bread or large amount of bolus.  -Nutritionist consulted.   Back pain: acute on chronic. Spondylosis.  Started low dose gabapentin 100 mg TID 9/10. Continue with current dose. Pain is getting better, unable to tolerate high dose. Continue with oxycodone PRN -MRI Lumbar Spine: 1. L5-S1 right paracentral disc extrusion with caudal migration, new from the prior exam, which effaces the right lateral recess and contacts the descending right S1 nerve roots.  Edema and enhancement at the superior aspect of L4, which appears concentrated about the superior endplate, with edema in the intervertebral disc, and a smaller focus of similar abnormal signal at the inferior  endplate of L3. These findings are nonspecific and can be seen in the setting of degenerative changes/acute Schmorl's node formation, which can be painful, although early discitis-osteomyelitis can appear similar. L3-L4 mild spinal canal stenosis, unchanged. L4-L5 and L5-S1 mild bilateral neural foraminal narrowing, unchanged. Neurosurgery consulted, they think MRI finding consistent with degenerative spondylosis throughout spine. No need for acute intervention.  Blood culture: no growth to date/ Blood culture from admission negative. Recent ESR normal, CRP normal. Inflammatory marker initially elevate on admission likely related to covid.  Plan to do dexamethasone taper dose    Elevated D dimer; in setting of covid Doppler LE negative for DVT On Lovenox. Level decreasing.   See wound care documentation below.  Pressure Injury 01/05/22 Buttocks Right Stage 2 -  Partial thickness loss of dermis presenting as a shallow open injury with a red, pink wound bed without slough. (Active)  01/05/22 2130  Location: Buttocks  Location Orientation: Right  Staging: Stage 2 -  Partial thickness loss of dermis presenting as a shallow open injury with a red, pink wound bed without slough.  Wound Description (Comments):   Present on Admission:   Dressing Type Foam - Lift dressing to assess site every shift 01/09/22 0900     Nutrition Problem: Severe Malnutrition Etiology: chronic illness    Signs/Symptoms: severe muscle depletion, severe fat depletion    Interventions: Ensure Enlive (each supplement provides 350kcal and 20 grams of protein), MVI, Liberalize Diet  Estimated body mass index is 19.95 kg/m as calculated from the following:   Height as of this encounter: $RemoveBeforeD'5\' 6"'[id number]$  (1.676 m).   Weight as of this encounter: 56.1 kg.   DVT prophylaxis: Lovenox Code Status: Full code Family Communication: Son at bedside Disposition Plan:  Status is: Inpatient Remains inpatient appropriate because:  management resp failure    Consultants:  CCM  Procedures:  ECHO;   Antimicrobials:    Subjective: Meds patient is resting comfortably in bed, patient denies any new complaints, noted abdominal distention, passing gas, no bowel movement in a couple of days.  Denies any chest pain, worsening shortness of breath.  Objective: Vitals:   01/09/22 0550 01/09/22 0821 01/09/22 1646 01/09/22 1839  BP: 125/77 133/74 101/63   Pulse: 79 81 77 78  Resp: 18 18 (!) 25 (!) 25  Temp: 97.6 F (36.4 C) 97.9 F (36.6 C) 97.8 F (36.6 C)   TempSrc: Axillary Oral Oral   SpO2: 95% 91% 92% 96%  Weight: 56.1 kg     Height:        Intake/Output Summary (Last 24 hours) at 01/09/2022 1905 Last data filed at 01/09/2022 1400 Gross per 24 hour  Intake 540 ml  Output 1080 ml  Net -540 ml   Filed Weights   01/07/22 0500 01/08/22 0614 01/09/22 0550  Weight: 57 kg 58.7 kg 56.1 kg    Examination: General: NAD  Cardiovascular: S1, S2 present Respiratory: Mild bibasilar crackles noted Abdomen: Soft, nontender, +distended, bowel sounds present Musculoskeletal: No bilateral pedal edema noted Skin: Normal Psychiatry: Normal mood    Data Reviewed: I have personally reviewed following labs and imaging studies  CBC: Recent Labs  Lab 01/05/22 0500 01/06/22 0549 01/07/22 0500 01/08/22 0433 01/09/22 0420  WBC 16.8* 13.9* 13.1* 11.2* 10.8*  NEUTROABS 13.8* 11.5* 10.8* 9.1* 8.0*  HGB 13.4 12.2* 11.8* 12.2* 11.8*  HCT 38.2* 35.8* 36.0* 34.3* 33.7*  MCV 93.9 95.7 97.8 93.2 93.9  PLT 298 280 257 222 748   Basic Metabolic Panel: Recent Labs  Lab 01/03/22 0344 01/03/22 1245 01/03/22 1525 01/04/22 0320 01/05/22 0500 01/06/22 0549 01/07/22 0500 01/08/22 0433 01/09/22 0420  NA 134* 137 138 140 139  --   --  136  --   K 5.2* 4.8 4.9 4.7 4.3  --   --  4.1  --   CL 101 106  --  104 102  --   --  102  --   CO2 28 27  --  28 26  --   --  31  --   GLUCOSE 222* 203*  --  90 151*  --   --  97  --    BUN 24* 29*  --  31* 45*  --   --  36*  --   CREATININE 0.82 0.90  --  0.83 1.22  --   --  0.93  --   CALCIUM 8.2* 8.0*  --  8.0* 8.5*  --   --  8.8*  --   MG 2.4  --   --  2.3 2.6* 2.4 2.5* 2.1 1.9  PHOS 2.8  --   --  2.5 4.8* 4.8* 4.1 3.5 3.1   GFR: Estimated Creatinine Clearance: 50.3 mL/min (by C-G formula based on SCr of 0.93 mg/dL). Liver Function Tests: Recent Labs  Lab 01/03/22 0344 01/04/22 0320 01/05/22 0500  AST 19 36 38  ALT 34 50* 56*  ALKPHOS 73 60 68  BILITOT 0.4 0.5 0.8  PROT 4.9* 4.5* 5.6*  ALBUMIN 1.9* 1.8* 2.5*   No results for input(s): "LIPASE", "AMYLASE" in the last 168 hours. No results for input(s): "AMMONIA" in the last 168 hours. Coagulation Profile: No results for input(s): "INR", "PROTIME" in the last 168 hours.  Cardiac Enzymes: No results for input(s): "CKTOTAL", "CKMB", "CKMBINDEX", "TROPONINI" in the last 168 hours. BNP (last 3 results) Recent Labs    09/07/21 1218  PROBNP 211.0*   HbA1C: No results for input(s): "HGBA1C" in the last 72 hours. CBG: Recent Labs  Lab 01/08/22 1701 01/08/22 2132 01/09/22 0819 01/09/22 1345 01/09/22 1638  GLUCAP 145* 173* 81 196* 177*   Lipid Profile: Recent Labs    01/07/22 0500  TRIG 70   Thyroid Function Tests: No results for input(s): "TSH", "T4TOTAL", "FREET4", "T3FREE", "THYROIDAB" in the last 72 hours. Anemia Panel: Recent Labs    01/07/22 0500  FERRITIN 123   Sepsis Labs: No results for input(s): "PROCALCITON", "LATICACIDVEN" in the last 168 hours.  Recent Results (from the past 240 hour(s))  Resp Panel by RT-PCR (Flu A&B, Covid) Anterior Nasal Swab     Status: Abnormal   Collection Time: 12/30/21 10:35 PM   Specimen: Anterior Nasal Swab  Result Value Ref Range Status   SARS Coronavirus 2 by RT PCR POSITIVE (A) NEGATIVE Final    Comment: (NOTE) SARS-CoV-2 target nucleic acids are DETECTED.  The SARS-CoV-2 RNA is generally detectable in upper respiratory specimens during the  acute phase of infection. Positive results are indicative of the presence of the identified virus, but do not rule out bacterial infection or co-infection with other pathogens not detected by the test. Clinical correlation with patient history and other diagnostic information is necessary to determine patient infection status. The expected result is Negative.  Fact Sheet for Patients: EntrepreneurPulse.com.au  Fact Sheet for Healthcare Providers:  IncredibleEmployment.be  This test is not yet approved or cleared by the [place] and  has been authorized for detection and/or diagnosis of SARS-CoV-2 by FDA under an Emergency Use Authorization (EUA).  This EUA will remain in effect (meaning this test can be used) for the duration of  the COVID-19 declaration under Section 564(b)(1) of the A ct, 21 U.S.C. section 360bbb-3(b)(1), unless the authorization is terminated or revoked sooner.     Influenza A by PCR NEGATIVE NEGATIVE Final   Influenza B by PCR NEGATIVE NEGATIVE Final    Comment: (NOTE) The Xpert Xpress SARS-CoV-2/FLU/RSV plus assay is intended as an aid in the diagnosis of influenza from Nasopharyngeal swab specimens and should not be used as a sole basis for treatment. Nasal washings and aspirates are unacceptable for Xpert Xpress SARS-CoV-2/FLU/RSV testing.  Fact Sheet for Patients: EntrepreneurPulse.com.au  Fact Sheet for Healthcare Providers: IncredibleEmployment.be  This test is not yet approved or cleared by the [place] FDA and has been authorized for detection and/or diagnosis of SARS-CoV-2 by FDA under an Emergency Use Authorization (EUA). This EUA will remain in effect (meaning this test can be used) for the duration of the COVID-19 declaration under Section 564(b)(1) of the Act, 21 U.S.C. section 360bbb-3(b)(1), unless the authorization is terminated or revoked.  Performed at  [facility], [place] [address] [place], [place] [address]   Blood Culture (routine x 2)     Status: None   Collection Time: 12/30/21 10:35 PM   Specimen: BLOOD RIGHT FOREARM  Result Value Ref Range Status   Specimen Description BLOOD RIGHT FOREARM  Final   Special Requests   Final    BOTTLES DRAWN AEROBIC AND ANAEROBIC Blood Culture adequate volume   Culture   Final    NO GROWTH 5 DAYS Performed at [facility], [place] [address] [place], [place] [address]    Report Status 01/04/2022 FINAL  Final  Respiratory (~20 pathogens) panel by PCR     Status: Abnormal   Collection Time: 12/30/21 10:35 PM   Specimen: Nasopharyngeal Swab; Respiratory  Result Value Ref Range Status   Adenovirus NOT DETECTED NOT DETECTED Final   Coronavirus 229E NOT DETECTED NOT DETECTED Final    Comment: (NOTE) The Coronavirus on the Respiratory Panel, DOES NOT test for the novel  Coronavirus (2019 nCoV)    Coronavirus HKU1 NOT DETECTED NOT DETECTED Final   Coronavirus NL63 NOT DETECTED NOT DETECTED Final   Coronavirus OC43 NOT DETECTED NOT DETECTED Final   Metapneumovirus NOT DETECTED NOT DETECTED Final   Rhinovirus / Enterovirus DETECTED (A) NOT DETECTED Final   Influenza A NOT DETECTED NOT DETECTED Final   Influenza B NOT DETECTED NOT DETECTED Final   Parainfluenza Virus 1 NOT DETECTED NOT DETECTED Final   Parainfluenza Virus 2 NOT DETECTED NOT DETECTED Final   Parainfluenza Virus 3 NOT DETECTED NOT DETECTED Final   Parainfluenza Virus 4 NOT DETECTED NOT DETECTED Final   Respiratory Syncytial Virus NOT DETECTED NOT DETECTED Final   Bordetella pertussis NOT DETECTED NOT DETECTED Final   Bordetella Parapertussis NOT DETECTED NOT DETECTED Final   Chlamydophila pneumoniae NOT DETECTED NOT DETECTED Final   Mycoplasma pneumoniae NOT DETECTED NOT DETECTED Final    Comment: Performed at [facility], [place]. [address] [place], [place] [address]  Blood Culture (routine x 2)     Status: None    Collection Time: 12/30/21 10:48 PM   Specimen: BLOOD LEFT FOREARM  Result Value Ref Range  Status   Specimen Description BLOOD LEFT FOREARM  Final   Special Requests   Final    BOTTLES DRAWN AEROBIC AND ANAEROBIC Blood Culture adequate volume   Culture   Final    NO GROWTH 5 DAYS Performed at [facility], [address] [place], [place] [address]    Report Status 01/04/2022 FINAL  Final  Culture, blood (Routine X 2) w Reflex to ID Panel     Status: None (Preliminary result)   Collection Time: 01/07/22  9:08 AM   Specimen: BLOOD LEFT ARM  Result Value Ref Range Status   Specimen Description BLOOD LEFT ARM  Final   Special Requests   Final    BOTTLES DRAWN AEROBIC AND ANAEROBIC Blood Culture adequate volume   Culture   Final    NO GROWTH 2 DAYS Performed at [facility], [place] [address] [place], [place] [address]    Report Status PENDING  Incomplete  Culture, blood (Routine X 2) w Reflex to ID Panel     Status: None (Preliminary result)   Collection Time: 01/07/22  9:08 AM   Specimen: BLOOD LEFT HAND  Result Value Ref Range Status   Specimen Description BLOOD LEFT HAND  Final   Special Requests   Final    BOTTLES DRAWN AEROBIC AND ANAEROBIC Blood Culture adequate volume   Culture   Final    NO GROWTH 2 DAYS Performed at [facility], [place] [address], [place], [place] [address]    Report Status PENDING  Incomplete         Radiology Studies: DG Abd Portable 1V  Result Date: 01/09/2022 CLINICAL DATA:  Abdominal distension, constipation. EXAM: PORTABLE ABDOMEN - 1 VIEW COMPARISON:  None Available. FINDINGS: Amount of residual contrast is noted in the nondilated colon. No abnormal bowel dilatation is noted. No significant stool burden is noted. Status post prostatic brachytherapy seed placement. IMPRESSION: No abnormal bowel dilatation. Electronically Signed   By: Marijo Conception M.D.   On: 01/09/2022 15:38   CT Angio Chest Pulmonary Embolism (PE) W or WO  Contrast  Result Date: 01/08/2022 CLINICAL DATA:  Chest pain, shortness of breath, pleurisy or effusion suspected; COVID positive EXAM: CT ANGIOGRAPHY CHEST WITH CONTRAST TECHNIQUE: Multidetector CT imaging of the chest was performed using the standard protocol during bolus administration of intravenous contrast. Multiplanar CT image reconstructions and MIPs were obtained to evaluate the vascular anatomy. RADIATION DOSE REDUCTION: This exam was performed according to the departmental dose-optimization program which includes automated exposure control, adjustment of the mA and/or kV according to patient size and/or use of iterative reconstruction technique. CONTRAST:  57mL OMNIPAQUE IOHEXOL 350 MG/ML SOLN COMPARISON:  CT 12/31/2021 FINDINGS: Cardiovascular: Satisfactory opacification of the pulmonary arteries to the segmental level. No evidence of pulmonary embolism. Normal heart size. No pericardial effusion. Aortic and coronary artery atherosclerotic calcification. Ascending thoracic aortic aneurysm measuring 42 mm in diameter. Mediastinum/Nodes: Decreased hilar and mediastinal lymph nodes since 12/31/2021. Unremarkable trachea and esophagus. Lungs/Pleura: Since 12/31/2021 there is increased ground-glass opacities on a background of marked interstitial lung disease. For example the superior segment of the left lower lobe was previously well aerated and now demonstrates ground-glass opacification. Additional increasing ground-glass opacities in the inferior right middle and lower lobes. No pleural effusion or pneumothorax. Upper Abdomen: Nonobstructing stone in the upper right kidney versus parenchymal calcification. No acute abnormality. Musculoskeletal: No acute osseous abnormality. Review of the MIP images confirms the above findings. IMPRESSION: 1. Negative for  acute pulmonary embolism. 2. Mildly increased bilateral ground-glass opacities on a background of interstitial lung disease concerning for worsening  pneumonia. 3. Ascending aortic aneurysm measuring 42 mm. This is unchanged from 12/08/2020. Recommend annual imaging followup by CTA or MRA. This recommendation follows 2010 ACCF/AHA/AATS/ACR/ASA/SCA/SCAI/SIR/STS/SVM Guidelines for the diagnosis and Management of Patients with Thoracic Aortic Disease. circulation. 2010; 121: K813-G871. Aortic aneurysm NOS (ICD10-I71.9) 4.  Aortic Atherosclerosis (ICD10-I70.0). Electronically Signed   By: Placido Sou M.D.   On: 01/08/2022 22:28        Scheduled Meds:  ascorbic acid  500 mg Oral Daily   aspirin  81 mg Oral Daily   dexamethasone  6 mg Oral Daily   Followed by   Derrill Memo ON 01/12/2022] dexamethasone  4 mg Oral Daily   Followed by   Derrill Memo ON 01/15/2022] dexamethasone  2 mg Oral Daily   enoxaparin (LOVENOX) injection  40 mg Subcutaneous Q24H   feeding supplement  237 mL Oral BID BM   gabapentin  100 mg Oral TID   Glycerin (Adult)  1 suppository Rectal Once   insulin aspart  0-9 Units Subcutaneous TID WC   insulin detemir  6 Units Subcutaneous Q12H   lidocaine  1 patch Transdermal Q24H   metoprolol tartrate  12.5 mg Oral BID   multivitamin with minerals  1 tablet Oral Daily   pantoprazole  40 mg Oral BID   polyethylene glycol  17 g Oral Daily   rosuvastatin  10 mg Oral QHS   senna  2 tablet Oral Daily   sodium chloride flush  10-40 mL Intracatheter Q12H   zinc sulfate  220 mg Oral Daily   Continuous Infusions:  sodium chloride Stopped (01/01/22 0609)     LOS: 9 days       Alma Friendly, MD [facility]   If 7PM-7AM, please contact night-coverage [email or web address]  01/09/2022, 7:05 PM

## 2022-01-09 NOTE — Progress Notes (Signed)
Physical Therapy Treatment Patient Details Name: Jeff Willis MRN: [id number] DOB: 1941/01/24 Today's Date: 01/09/2022   History of Present Illness Pt is an 81 y.o. male admitted 12/30/21 with hypoxia and failure to thrive after returning home from flight to [place] on same day; of note, pt with recent COVID+ test 12/26/21 while on cruise. Workup for IPF flare secondary to COVID and rhinovirus. ETT 9/5-9/8. PMH includes esophageal stenosis, PAD, GERD, spinal stenosis.    PT Comments    Pt tolerates treatment well, ambulating for multiple trials. Pt continues to remain limited in ambulation distances, with desaturation even when on 4L [place], but recovers within a few minutes of resting and is eager to mobilize again. PT provides reinforcement for use of incentive spirometer in an effort to improve tidal and inspiratory volumes. PT continues to strongly recommend AIR at the time of discharge as the pt is highly motivated and demonstrates the potential to make significant progress with high intensity inpatient PT services.   Recommendations for follow up therapy are one component of a multi-disciplinary discharge planning process, led by the attending physician.  Recommendations may be updated based on patient status, additional functional criteria and insurance authorization.  Follow Up Recommendations  Acute inpatient rehab (3hours/day)     Assistance Recommended at Discharge Frequent or constant Supervision/Assistance  Patient can return home with the following A lot of help with bathing/dressing/bathroom;Assistance with cooking/housework;Assist for transportation;Help with stairs or ramp for entrance;A little help with walking and/or transfers   Equipment Recommendations   (TBD pending progress)    Recommendations for Other Services       Precautions / Restrictions Precautions Precautions: Fall;Other (comment) Precaution Comments: Watch SpO2 (does not wear O2 baseline); urine  incontinence (has Primofit) Restrictions Weight Bearing Restrictions: No     Mobility  Bed Mobility Overal bed mobility: Needs Assistance Bed Mobility: Supine to Sit, Sit to Supine   Sidelying to sit: Min assist, HOB elevated   Sit to supine: Min guard        Transfers Overall transfer level: Needs assistance Equipment used: Rolling walker (2 wheels) Transfers: Sit to/from Stand Sit to Stand: Min assist                Ambulation/Gait Ambulation/Gait assistance: Min Web designer (Feet): 8 Feet (8' x 3 trials) Assistive device: Rolling walker (2 wheels) Gait Pattern/deviations: Step-to pattern Gait velocity: reduced Gait velocity interpretation: <1.31 ft/sec, indicative of household ambulator   General Gait Details: pt with slowed step-to gait, reduced step length and foot clearance.   Stairs             Wheelchair Mobility    Modified Rankin (Stroke Patients Only)       Balance Overall balance assessment: Needs assistance Sitting-balance support: No upper extremity supported, Feet supported Sitting balance-Leahy Scale: Good     Standing balance support: Single extremity supported, Bilateral upper extremity supported, Reliant on assistive device for balance Standing balance-Leahy Scale: Poor                              Cognition Arousal/Alertness: Awake/alert Behavior During Therapy: Flat affect Overall Cognitive Status: Within Functional Limits for tasks assessed                                          Exercises  General Comments General comments (skin integrity, edema, etc.): pt on 1L [place] upon PT arrival, pt attempts ambulation on 1L [place] for initial trial with desat to 72%, prolonged recovery requiring increase in supplemental oxygen to 4L [place]. PT increases supplemental O2 to 4L [place] for final 2 ambulation attempts with desat to mid-80s, recovery within 1-2 minutes once seated and resting       Pertinent Vitals/Pain Pain Assessment Pain Assessment: Faces Faces Pain Scale: Hurts a little bit Pain Location: abdomen Pain Descriptors / Indicators: Cramping Pain Intervention(s): Monitored during session    Home Living                          Prior Function            PT Goals (current goals can now be found in the care plan section) Acute Rehab PT Goals Patient Stated Goal: to go home Progress towards PT goals: Progressing toward goals    Frequency    Min 3X/week      PT Plan Current plan remains appropriate    Co-evaluation              AM-PAC PT "6 Clicks" Mobility   Outcome Measure  Help needed turning from your back to your side while in a flat bed without using bedrails?: A Little Help needed moving from lying on your back to sitting on the side of a flat bed without using bedrails?: A Little Help needed moving to and from a bed to a chair (including a wheelchair)?: A Little Help needed standing up from a chair using your arms (e.g., wheelchair or bedside chair)?: A Little Help needed to walk in hospital room?: Total Help needed climbing 3-5 steps with a railing? : Total 6 Click Score: 14    End of Session Equipment Utilized During Treatment: Oxygen Activity Tolerance: Patient tolerated treatment well Patient left: in bed;with call bell/phone within reach;with family/visitor present Nurse Communication: Mobility status PT Visit Diagnosis: Unsteadiness on feet (R26.81);Muscle weakness (generalized) (M62.81)     Time: 2505-3976 PT Time Calculation (min) (ACUTE ONLY): 30 min  Charges:  $Gait Training: 23-37 mins                     Zenaida Niece, PT, DPT Acute Rehabilitation Office [place] 01/09/2022, 3:17 PM

## 2022-01-09 NOTE — Progress Notes (Signed)
Inpatient Rehabilitation Admissions Coordinator   We will sign off as [facility] has begun Auth and he remains on airborne precautions.  Danne Baxter, RN, MSN Rehab Admissions Coordinator [phone] 01/09/2022 8:32 AM

## 2022-01-09 NOTE — Plan of Care (Signed)
  Problem: Education: Goal: Knowledge of risk factors and measures for prevention of condition will improve Outcome: Progressing   Problem: Coping: Goal: Psychosocial and spiritual needs will be supported Outcome: Progressing   Problem: Respiratory: Goal: Will maintain a patent airway Outcome: Progressing Goal: Complications related to the disease process, condition or treatment will be avoided or minimized Outcome: Progressing   Problem: Education: Goal: Knowledge of General Education information will improve Description: Including pain rating scale, medication(s)/side effects and non-pharmacologic comfort measures Outcome: Progressing   Problem: Health Behavior/Discharge Planning: Goal: Ability to manage health-related needs will improve Outcome: Progressing   Problem: Clinical Measurements: Goal: Ability to maintain clinical measurements within normal limits will improve Outcome: Progressing Goal: Will remain free from infection Outcome: Progressing Goal: Diagnostic test results will improve Outcome: Progressing Goal: Respiratory complications will improve Outcome: Progressing Goal: Cardiovascular complication will be avoided Outcome: Progressing   Problem: Activity: Goal: Risk for activity intolerance will decrease Outcome: Progressing   Problem: Nutrition: Goal: Adequate nutrition will be maintained Outcome: Progressing   Problem: Coping: Goal: Level of anxiety will decrease Outcome: Progressing   Problem: Elimination: Goal: Will not experience complications related to bowel motility Outcome: Progressing Goal: Will not experience complications related to urinary retention Outcome: Progressing   Problem: Pain Managment: Goal: General experience of comfort will improve Outcome: Progressing   Problem: Safety: Goal: Ability to remain free from injury will improve Outcome: Progressing   Problem: Skin Integrity: Goal: Risk for impaired skin integrity will  decrease Outcome: Progressing   Problem: Education: Goal: Ability to describe self-care measures that may prevent or decrease complications (Diabetes Survival Skills Education) will improve Outcome: Progressing Goal: Individualized Educational Video(s) Outcome: Progressing   Problem: Coping: Goal: Ability to adjust to condition or change in health will improve Outcome: Progressing   Problem: Fluid Volume: Goal: Ability to maintain a balanced intake and output will improve Outcome: Progressing   Problem: Health Behavior/Discharge Planning: Goal: Ability to identify and utilize available resources and services will improve Outcome: Progressing Goal: Ability to manage health-related needs will improve Outcome: Progressing   Problem: Metabolic: Goal: Ability to maintain appropriate glucose levels will improve Outcome: Progressing   Problem: Nutritional: Goal: Maintenance of adequate nutrition will improve Outcome: Progressing Goal: Progress toward achieving an optimal weight will improve Outcome: Progressing   Problem: Skin Integrity: Goal: Risk for impaired skin integrity will decrease Outcome: Progressing   Problem: Tissue Perfusion: Goal: Adequacy of tissue perfusion will improve Outcome: Progressing   

## 2022-01-10 ENCOUNTER — Inpatient Hospital Stay (HOSPITAL_COMMUNITY): Payer: Medicare Other

## 2022-01-10 ENCOUNTER — Telehealth: Payer: Self-pay | Admitting: Internal Medicine

## 2022-01-10 ENCOUNTER — Ambulatory Visit: Payer: Medicare Other | Admitting: Internal Medicine

## 2022-01-10 DIAGNOSIS — J9601 Acute respiratory failure with hypoxia: Secondary | ICD-10-CM | POA: Diagnosis not present

## 2022-01-10 DIAGNOSIS — U071 COVID-19: Secondary | ICD-10-CM | POA: Diagnosis not present

## 2022-01-10 DIAGNOSIS — E43 Unspecified severe protein-calorie malnutrition: Secondary | ICD-10-CM | POA: Diagnosis not present

## 2022-01-10 DIAGNOSIS — J849 Interstitial pulmonary disease, unspecified: Secondary | ICD-10-CM | POA: Diagnosis not present

## 2022-01-10 LAB — CBC WITH DIFFERENTIAL/PLATELET
Abs Immature Granulocytes: 0.09 10*3/uL — ABNORMAL HIGH (ref 0.00–0.07)
Basophils Absolute: 0 10*3/uL (ref 0.0–0.1)
Basophils Relative: 0 %
Eosinophils Absolute: 0 10*3/uL (ref 0.0–0.5)
Eosinophils Relative: 0 %
HCT: 33.9 % — ABNORMAL LOW (ref 39.0–52.0)
Hemoglobin: 12 g/dL — ABNORMAL LOW (ref 13.0–17.0)
Immature Granulocytes: 1 %
Lymphocytes Relative: 10 %
Lymphs Abs: 1.2 10*3/uL (ref 0.7–4.0)
MCH: 33.1 pg (ref 26.0–34.0)
MCHC: 35.4 g/dL (ref 30.0–36.0)
MCV: 93.6 fL (ref 80.0–100.0)
Monocytes Absolute: 1 10*3/uL (ref 0.1–1.0)
Monocytes Relative: 8 %
Neutro Abs: 9.5 10*3/uL — ABNORMAL HIGH (ref 1.7–7.7)
Neutrophils Relative %: 81 %
Platelets: 207 10*3/uL (ref 150–400)
RBC: 3.62 MIL/uL — ABNORMAL LOW (ref 4.22–5.81)
RDW: 12.7 % (ref 11.5–15.5)
WBC: 11.8 10*3/uL — ABNORMAL HIGH (ref 4.0–10.5)
nRBC: 0 % (ref 0.0–0.2)

## 2022-01-10 LAB — BASIC METABOLIC PANEL
Anion gap: 6 (ref 5–15)
BUN: 30 mg/dL — ABNORMAL HIGH (ref 8–23)
CO2: 26 mmol/L (ref 22–32)
Calcium: 8.7 mg/dL — ABNORMAL LOW (ref 8.9–10.3)
Chloride: 102 mmol/L (ref 98–111)
Creatinine, Ser: 0.91 mg/dL (ref 0.61–1.24)
GFR, Estimated: >60 mL/min (ref >60)
Glucose, Bld: 101 mg/dL — ABNORMAL HIGH (ref 70–99)
Potassium: 4.2 mmol/L (ref 3.5–5.1)
Sodium: 134 mmol/L — ABNORMAL LOW (ref 135–145)

## 2022-01-10 LAB — GLUCOSE, CAPILLARY
Glucose-Capillary: 125 mg/dL — ABNORMAL HIGH (ref 70–99)
Glucose-Capillary: 132 mg/dL — ABNORMAL HIGH (ref 70–99)
Glucose-Capillary: 133 mg/dL — ABNORMAL HIGH (ref 70–99)
Glucose-Capillary: 220 mg/dL — ABNORMAL HIGH (ref 70–99)
Glucose-Capillary: 83 mg/dL (ref 70–99)

## 2022-01-10 LAB — MAGNESIUM: Magnesium: 2 mg/dL (ref 1.7–2.4)

## 2022-01-10 LAB — PHOSPHORUS: Phosphorus: 3.3 mg/dL (ref 2.5–4.6)

## 2022-01-10 MED ORDER — INSULIN ASPART 100 UNIT/ML IJ SOLN
0.0000 [IU] | INTRAMUSCULAR | Status: DC
  Administered 2022-01-12: 3 [IU] via SUBCUTANEOUS

## 2022-01-10 MED ORDER — CHLORHEXIDINE GLUCONATE CLOTH 2 % EX PADS
6.0000 | MEDICATED_PAD | Freq: Every day | CUTANEOUS | Status: DC
  Administered 2022-01-10 – 2022-01-12 (×3): 6 via TOPICAL

## 2022-01-10 MED ORDER — GLYCERIN (LAXATIVE) 2 G RE SUPP
1.0000 | Freq: Once | RECTAL | Status: DC
  Filled 2022-01-10 (×2): qty 1

## 2022-01-10 MED ORDER — SODIUM CHLORIDE 0.9 % IV SOLN
3.0000 g | Freq: Four times a day (QID) | INTRAVENOUS | Status: AC
  Administered 2022-01-10 – 2022-01-15 (×21): 3 g via INTRAVENOUS
  Filled 2022-01-10 (×21): qty 8

## 2022-01-10 MED ORDER — METHYLPREDNISOLONE SODIUM SUCC 125 MG IJ SOLR
60.0000 mg | Freq: Every day | INTRAMUSCULAR | Status: DC
  Administered 2022-01-10 – 2022-01-14 (×5): 60 mg via INTRAVENOUS
  Filled 2022-01-10 (×5): qty 2

## 2022-01-10 MED ORDER — FUROSEMIDE 10 MG/ML IJ SOLN
40.0000 mg | Freq: Once | INTRAMUSCULAR | Status: AC
  Administered 2022-01-10: 40 mg via INTRAVENOUS
  Filled 2022-01-10: qty 4

## 2022-01-10 MED ORDER — SALINE SPRAY 0.65 % NA SOLN
1.0000 | NASAL | Status: DC | PRN
  Administered 2022-01-10: 1 via NASAL
  Filled 2022-01-10: qty 44

## 2022-01-10 NOTE — Progress Notes (Signed)
PROGRESS NOTE    Jeff Willis  IRW:[id number] DOB: [birth year] DOA: 12/30/2021 PCP: Gaynelle Arabian, MD   Brief Narrative: 81 year old with past medical history significant for known esophageal stenosis status post dilation April 2023, IPF on [place] (FVC 84% and DLCO 45 % May 2023) patient flew to [place] for a cruise, on 12/24/2021 developed fever cough.  On 12/26/2021 on the cruise he was diagnosed with COVID-19 infection, declined Paxlovid at that time, due to previously experienced side effects from it.  Continue to have cough, failure to thrive, returned to [place] via flight 12/30/2021.  Pulse ox was checked and was found to be hypoxic presented via EMS.  COVID PCR positive.  CT chest with contrast negative for PE, CTA chest neg for PE. Admitted to the hospital 12/30/2021 with acute hypoxic respiratory failure secondary to COVID-19. On 9/5 was intubated due to respiratory failure and extubated on 9/8. TR[facility] assumed care.    Assessment & Plan:   Principal Problem:   Acute respiratory failure with hypoxia (HCC) Active Problems:   Interstitial lung disease ([place])   COVID-19 virus infection   Hyponatremia   History of sinus tachycardia   Pneumonia due to COVID-19 virus   Protein-calorie malnutrition, severe   Pressure injury of skin  Acute Hypoxic respiratory failure IPF flare in the setting of COVID-19 and rhinovirus Possible aspiration pneumonia on 01/10/2022 Patient subsequently 9/5 developed worsening respiratory failure requiring intubation, extubated on 9/8 Now requiring about 20 L of high flow nasal cannula on 9/14, PCCM reconsulted CTA chest negative for PE Completed tapered dose of dexamethasone on 01/10/2022, start Solu-Medrol on 9/14 as per PCCM Completed 5 days antibiotics, started Unasyn on 01/10/2022 Continue with flutter valve, PRN Mucinex.  Patient needs to be on airborne precaution for covid for 21 days from 8/30 (Covid test perform at the cruise) . Off isolation  9/21 High risk for re-intubation-transfer to ICU Patient accepted to [facility] inpatient rehab prior to his deterioration, on hold for now  Abdominal distention/?Constipation Reports no BM for the past few days, passing gas Noted some distention, denies any significant pain Abdominal x-ray unremarkable, no bowel dilatation, no significant stool burden is noted Continue laxatives  Tachycardia/A-fib Hypertension Demand ischemia Continue with metoprolol PRN hydralazine.   Diabetes mellitus type 2 SSI, Accu-Cheks, hypoglycemic protocol  Known esophageal dilation April 2023 History of dysphagia for solid and spices PPI-- change to BID.  MBS: No evidence of aspiration,  A 13 mm pill  lodged briefly in the vallecular space, then above the UES.  It passed through the UES into the cervical esophagus, then returned to the hypopharynx.  A pureed bolus helped pass it through the esophagus without incident/  Patient to follow regular diet but to avoid large bolus of food, hard meat, should eat soft food.  He relates food has been going down fine. Plan to monitor for worsening symptoms.    Anemia of chronic disease Hb stable.   Severe protein caloric malnutrition due to acute illness Failure to thrive due to acute illness, weight loss from IPF -BSM: Diet advanced to regular, soft, avoid meat, bread or large amount of bolus.  -Nutritionist consulted.   Back pain: acute on chronic. Spondylosis.  Started low dose gabapentin 100 mg TID 9/10. Continue with current dose. Pain is getting better, unable to tolerate high dose. Continue with oxycodone PRN -MRI Lumbar Spine: 1. L5-S1 right paracentral disc extrusion with caudal migration, new from the prior exam, which effaces the right lateral recess and  contacts the descending right S1 nerve roots. Edema and enhancement at the superior aspect of L4, which appears concentrated about the superior endplate, with edema in the intervertebral disc, and a  smaller focus of similar abnormal signal at the inferior endplate of L3. These findings are nonspecific and can be seen in the setting of degenerative changes/acute Schmorl's node formation, which can be painful, although early discitis-osteomyelitis can appear similar. L3-L4 mild spinal canal stenosis, unchanged. L4-L5 and L5-S1 mild bilateral neural foraminal narrowing, unchanged. Neurosurgery consulted, they think MRI finding consistent with degenerative spondylosis throughout spine. No need for acute intervention.  Blood culture: no growth to date/ Blood culture from admission negative. Recent ESR normal, CRP normal. Inflammatory marker initially elevate on admission likely related to covid.  Plan to do dexamethasone taper dose    Elevated D dimer; in setting of covid Doppler LE negative for DVT On Lovenox. Level decreasing.   See wound care documentation below.  Pressure Injury 01/05/22 Buttocks Right Stage 2 -  Partial thickness loss of dermis presenting as a shallow open injury with a red, pink wound bed without slough. (Active)  01/05/22 2130  Location: Buttocks  Location Orientation: Right  Staging: Stage 2 -  Partial thickness loss of dermis presenting as a shallow open injury with a red, pink wound bed without slough.  Wound Description (Comments):   Present on Admission:   Dressing Type Foam - Lift dressing to assess site every shift 01/10/22 1000     Nutrition Problem: Severe Malnutrition Etiology: chronic illness    Signs/Symptoms: severe muscle depletion, severe fat depletion    Interventions: Ensure Enlive (each supplement provides 350kcal and 20 grams of protein), MVI, Liberalize Diet  Estimated body mass index is 20.51 kg/m as calculated from the following:   Height as of this encounter: $RemoveBeforeD'5\' 6"'[id number]$  (1.676 m).   Weight as of this encounter: 57.7 kg.   DVT prophylaxis: Lovenox Code Status: Full code Family Communication: Son at bedside Disposition Plan:  Status  is: Inpatient Remains inpatient appropriate because: management resp failure    Consultants:  CCM  Procedures:  ECHO;   Antimicrobials:    Subjective: Met patient upright in bed, son at bedside.  Noted to have more productive cough, with some shortness of breath.  Denies any chest pain, abdominal pain.  Had an episode of vomiting, possibly aspirated.  Objective: Vitals:   01/10/22 1255 01/10/22 1309 01/10/22 1317 01/10/22 1614  BP:  111/67 111/67 130/80  Pulse: 84 80 78 86  Resp: (!) 29 (!) 31 (!) 30 (!) 22  Temp:  97.8 F (36.6 C)  (!) 97.5 F (36.4 C)  TempSrc:  Oral  Oral  SpO2: (!) 81% 96% 94% 98%  Weight:      Height:        Intake/Output Summary (Last 24 hours) at 01/10/2022 1709 Last data filed at 01/09/2022 2114 Gross per 24 hour  Intake --  Output 450 ml  Net -450 ml   Filed Weights   01/08/22 0614 01/09/22 0550 01/10/22 0553  Weight: 58.7 kg 56.1 kg 57.7 kg    Examination: General: NAD  Cardiovascular: S1, S2 present Respiratory: Mild bibasilar crackles noted, diminished breath sounds Abdomen: Soft, nontender, +distended, bowel sounds present Musculoskeletal: No bilateral pedal edema noted Skin: Normal Psychiatry: Normal mood    Data Reviewed: I have personally reviewed following labs and imaging studies  CBC: Recent Labs  Lab 01/06/22 0549 01/07/22 0500 01/08/22 0433 01/09/22 0420 01/10/22 0307  WBC 13.9* 13.1*  11.2* 10.8* 11.8*  NEUTROABS 11.5* 10.8* 9.1* 8.0* 9.5*  HGB 12.2* 11.8* 12.2* 11.8* 12.0*  HCT 35.8* 36.0* 34.3* 33.7* 33.9*  MCV 95.7 97.8 93.2 93.9 93.6  PLT 280 257 222 210 207   Basic Metabolic Panel: Recent Labs  Lab 01/04/22 0320 01/05/22 0500 01/06/22 0549 01/07/22 0500 01/08/22 0433 01/09/22 0420 01/10/22 0307  NA 140 139  --   --  136  --  134*  K 4.7 4.3  --   --  4.1  --  4.2  CL 104 102  --   --  102  --  102  CO2 28 26  --   --  31  --  26  GLUCOSE 90 151*  --   --  97  --  101*  BUN 31* 45*  --   --   36*  --  30*  CREATININE 0.83 1.22  --   --  0.93  --  0.91  CALCIUM 8.0* 8.5*  --   --  8.8*  --  8.7*  MG 2.3 2.6* 2.4 2.5* 2.1 1.9 2.0  PHOS 2.5 4.8* 4.8* 4.1 3.5 3.1 3.3   GFR: Estimated Creatinine Clearance: 52.8 mL/min (by C-G formula based on SCr of 0.91 mg/dL). Liver Function Tests: Recent Labs  Lab 01/04/22 0320 01/05/22 0500  AST 36 38  ALT 50* 56*  ALKPHOS 60 68  BILITOT 0.5 0.8  PROT 4.5* 5.6*  ALBUMIN 1.8* 2.5*   No results for input(s): "LIPASE", "AMYLASE" in the last 168 hours. No results for input(s): "AMMONIA" in the last 168 hours. Coagulation Profile: No results for input(s): "INR", "PROTIME" in the last 168 hours.  Cardiac Enzymes: No results for input(s): "CKTOTAL", "CKMB", "CKMBINDEX", "TROPONINI" in the last 168 hours. BNP (last 3 results) Recent Labs    09/07/21 1218  PROBNP 211.0*   HbA1C: No results for input(s): "HGBA1C" in the last 72 hours. CBG: Recent Labs  Lab 01/09/22 1638 01/09/22 2110 01/10/22 0805 01/10/22 1313 01/10/22 1612  GLUCAP 177* 260* 83 132* 220*   Lipid Profile: No results for input(s): "CHOL", "HDL", "LDLCALC", "TRIG", "CHOLHDL", "LDLDIRECT" in the last 72 hours.  Thyroid Function Tests: No results for input(s): "TSH", "T4TOTAL", "FREET4", "T3FREE", "THYROIDAB" in the last 72 hours. Anemia Panel: No results for input(s): "VITAMINB12", "FOLATE", "FERRITIN", "TIBC", "IRON", "RETICCTPCT" in the last 72 hours.  Sepsis Labs: No results for input(s): "PROCALCITON", "LATICACIDVEN" in the last 168 hours.  Recent Results (from the past 240 hour(s))  Culture, blood (Routine X 2) w Reflex to ID Panel     Status: None (Preliminary result)   Collection Time: 01/07/22  9:08 AM   Specimen: BLOOD LEFT ARM  Result Value Ref Range Status   Specimen Description BLOOD LEFT ARM  Final   Special Requests   Final    BOTTLES DRAWN AEROBIC AND ANAEROBIC Blood Culture adequate volume   Culture   Final    NO GROWTH 3 DAYS Performed  at [facility], [address] [place], [place] [address]    Report Status PENDING  Incomplete  Culture, blood (Routine X 2) w Reflex to ID Panel     Status: None (Preliminary result)   Collection Time: 01/07/22  9:08 AM   Specimen: BLOOD LEFT HAND  Result Value Ref Range Status   Specimen Description BLOOD LEFT HAND  Final   Special Requests   Final    BOTTLES DRAWN AEROBIC AND ANAEROBIC Blood Culture adequate volume  Culture   Final    NO GROWTH 3 DAYS Performed at [facility], [place] [address] [place], [place] [address]    Report Status PENDING  Incomplete         Radiology Studies: DG CHEST PORT 1 VIEW  Result Date: 01/10/2022 CLINICAL DATA:  COVID positive. Shortness of breath, tachycardia and hypoxia. EXAM: PORTABLE CHEST 1 VIEW COMPARISON:  Chest x-ray 01/04/2022 and chest CT 01/08/2022 FINDINGS: The cardiac silhouette, mediastinal and hilar contours are stable. Persistent diffuse changes of interstitial pneumonitis, likely viral/COVID pneumonia. No definite pleural effusions. No pneumothorax. IMPRESSION: Persistent changes of viral/COVID pneumonia. Electronically Signed   By: Marijo Sanes M.D.   On: 01/10/2022 14:39   DG Abd Portable 1V  Result Date: 01/09/2022 CLINICAL DATA:  Abdominal distension, constipation. EXAM: PORTABLE ABDOMEN - 1 VIEW COMPARISON:  None Available. FINDINGS: Amount of residual contrast is noted in the nondilated colon. No abnormal bowel dilatation is noted. No significant stool burden is noted. Status post prostatic brachytherapy seed placement. IMPRESSION: No abnormal bowel dilatation. Electronically Signed   By: Marijo Conception M.D.   On: 01/09/2022 15:38   CT Angio Chest Pulmonary Embolism (PE) W or WO Contrast  Result Date: 01/08/2022 CLINICAL DATA:  Chest pain, shortness of breath, pleurisy or effusion suspected; COVID positive EXAM: CT ANGIOGRAPHY CHEST WITH CONTRAST TECHNIQUE: Multidetector CT imaging of the chest was performed  using the standard protocol during bolus administration of intravenous contrast. Multiplanar CT image reconstructions and MIPs were obtained to evaluate the vascular anatomy. RADIATION DOSE REDUCTION: This exam was performed according to the departmental dose-optimization program which includes automated exposure control, adjustment of the mA and/or kV according to patient size and/or use of iterative reconstruction technique. CONTRAST:  52mL OMNIPAQUE IOHEXOL 350 MG/ML SOLN COMPARISON:  CT 12/31/2021 FINDINGS: Cardiovascular: Satisfactory opacification of the pulmonary arteries to the segmental level. No evidence of pulmonary embolism. Normal heart size. No pericardial effusion. Aortic and coronary artery atherosclerotic calcification. Ascending thoracic aortic aneurysm measuring 42 mm in diameter. Mediastinum/Nodes: Decreased hilar and mediastinal lymph nodes since 12/31/2021. Unremarkable trachea and esophagus. Lungs/Pleura: Since 12/31/2021 there is increased ground-glass opacities on a background of marked interstitial lung disease. For example the superior segment of the left lower lobe was previously well aerated and now demonstrates ground-glass opacification. Additional increasing ground-glass opacities in the inferior right middle and lower lobes. No pleural effusion or pneumothorax. Upper Abdomen: Nonobstructing stone in the upper right kidney versus parenchymal calcification. No acute abnormality. Musculoskeletal: No acute osseous abnormality. Review of the MIP images confirms the above findings. IMPRESSION: 1. Negative for acute pulmonary embolism. 2. Mildly increased bilateral ground-glass opacities on a background of interstitial lung disease concerning for worsening pneumonia. 3. Ascending aortic aneurysm measuring 42 mm. This is unchanged from 12/08/2020. Recommend annual imaging followup by CTA or MRA. This recommendation follows 2010 ACCF/AHA/AATS/ACR/ASA/SCA/SCAI/SIR/STS/SVM Guidelines for the  diagnosis and Management of Patients with Thoracic Aortic Disease. circulation. 2010; 121: F751-W258. Aortic aneurysm NOS (ICD10-I71.9) 4.  Aortic Atherosclerosis (ICD10-I70.0). Electronically Signed   By: Placido Sou M.D.   On: 01/08/2022 22:28        Scheduled Meds:  ascorbic acid  500 mg Oral Daily   aspirin  81 mg Oral Daily   enoxaparin (LOVENOX) injection  40 mg Subcutaneous Q24H   feeding supplement  237 mL Oral BID BM   gabapentin  100 mg Oral TID   Glycerin (Adult)  1 suppository Rectal Once   insulin aspart  0-9  Units Subcutaneous TID WC   insulin detemir  6 Units Subcutaneous Q12H   lidocaine  1 patch Transdermal Q24H   methylPREDNISolone (SOLU-MEDROL) injection  60 mg Intravenous Daily   metoprolol tartrate  12.5 mg Oral BID   multivitamin with minerals  1 tablet Oral Daily   pantoprazole  40 mg Oral BID   polyethylene glycol  17 g Oral Daily   rosuvastatin  10 mg Oral QHS   senna  2 tablet Oral Daily   sodium chloride flush  10-40 mL Intracatheter Q12H   zinc sulfate  220 mg Oral Daily   Continuous Infusions:  sodium chloride Stopped (01/01/22 0609)   ampicillin-sulbactam (UNASYN) IV 3 g (01/10/22 1652)     LOS: 10 days       Alma Friendly, MD [facility]   If 7PM-7AM, please contact night-coverage [email or web address]  01/10/2022, 5:09 PM

## 2022-01-10 NOTE — Plan of Care (Signed)
  Problem: Education: Goal: Knowledge of risk factors and measures for prevention of condition will improve Outcome: Progressing   Problem: Coping: Goal: Psychosocial and spiritual needs will be supported Outcome: Progressing   Problem: Respiratory: Goal: Will maintain a patent airway Outcome: Progressing Goal: Complications related to the disease process, condition or treatment will be avoided or minimized Outcome: Progressing   

## 2022-01-10 NOTE — Progress Notes (Signed)
[facility] Progress Note Patient Name: Jeff Willis DOB: Jan 29, 1941 MRN: [id number]   Date of Service  01/10/2022  HPI/Events of Note  Hyperglycemia - Patient is NPO with sips with meds. Blood glucose = 220.  eICU Interventions  Will change AC/HS sensitive Novolog SSI to Q 4 hour sensitive Novolog SSI.     Intervention Category Major Interventions: Hyperglycemia - active titration of insulin therapy  Lysle Dingwall 01/10/2022, 8:53 PM

## 2022-01-10 NOTE — Progress Notes (Signed)
[facility] Progress Note Patient Name: Jeff Willis DOB: 09-01-1940 MRN: [id number]   Date of Service  01/10/2022  HPI/Events of Note  Patient c/o nasal dryness from [place] O2.  eICU Interventions  Will order saline nasal spray 1 spray to each nostril PRN.     Intervention Category Major Interventions: Other:  Lysle Dingwall 01/10/2022, 9:28 PM

## 2022-01-10 NOTE — Progress Notes (Signed)
PT Cancellation Note  Patient Details Name: CAMDIN HEGNER MRN: [id number] DOB: 02-Mar-1941   Cancelled Treatment:    Reason Eval/Treat Not Completed: Medical issues which prohibited therapy, per RN pt not appropriate for PT this date, pt with increased supplemental O2 demand. Will continue to follow acutely.   Audry Riles. PTA Acute Rehabilitation Services Office: [place] 01/10/2022, 2:50 PM

## 2022-01-10 NOTE — Plan of Care (Signed)
  Problem: Education: Goal: Knowledge of risk factors and measures for prevention of condition will improve Outcome: Progressing   Problem: Coping: Goal: Psychosocial and spiritual needs will be supported Outcome: Progressing   Problem: Respiratory: Goal: Will maintain a patent airway Outcome: Progressing Goal: Complications related to the disease process, condition or treatment will be avoided or minimized Outcome: Progressing   Problem: Education: Goal: Knowledge of General Education information will improve Description: Including pain rating scale, medication(s)/side effects and non-pharmacologic comfort measures Outcome: Progressing   Problem: Health Behavior/Discharge Planning: Goal: Ability to manage health-related needs will improve Outcome: Progressing   Problem: Clinical Measurements: Goal: Ability to maintain clinical measurements within normal limits will improve Outcome: Progressing Goal: Will remain free from infection Outcome: Progressing Goal: Diagnostic test results will improve Outcome: Progressing Goal: Respiratory complications will improve Outcome: Progressing Goal: Cardiovascular complication will be avoided Outcome: Progressing   Problem: Activity: Goal: Risk for activity intolerance will decrease Outcome: Progressing   Problem: Nutrition: Goal: Adequate nutrition will be maintained Outcome: Progressing   Problem: Coping: Goal: Level of anxiety will decrease Outcome: Progressing   Problem: Elimination: Goal: Will not experience complications related to bowel motility Outcome: Progressing Goal: Will not experience complications related to urinary retention Outcome: Progressing   Problem: Pain Managment: Goal: General experience of comfort will improve Outcome: Progressing   Problem: Safety: Goal: Ability to remain free from injury will improve Outcome: Progressing   Problem: Skin Integrity: Goal: Risk for impaired skin integrity will  decrease Outcome: Progressing   Problem: Education: Goal: Ability to describe self-care measures that may prevent or decrease complications (Diabetes Survival Skills Education) will improve Outcome: Progressing Goal: Individualized Educational Video(s) Outcome: Progressing   Problem: Coping: Goal: Ability to adjust to condition or change in health will improve Outcome: Progressing   Problem: Fluid Volume: Goal: Ability to maintain a balanced intake and output will improve Outcome: Progressing   Problem: Health Behavior/Discharge Planning: Goal: Ability to identify and utilize available resources and services will improve Outcome: Progressing Goal: Ability to manage health-related needs will improve Outcome: Progressing   Problem: Metabolic: Goal: Ability to maintain appropriate glucose levels will improve Outcome: Progressing   Problem: Nutritional: Goal: Maintenance of adequate nutrition will improve Outcome: Progressing Goal: Progress toward achieving an optimal weight will improve Outcome: Progressing   Problem: Skin Integrity: Goal: Risk for impaired skin integrity will decrease Outcome: Progressing   Problem: Tissue Perfusion: Goal: Adequacy of tissue perfusion will improve Outcome: Progressing   

## 2022-01-10 NOTE — Progress Notes (Signed)
RT called to pt's room by RN for pt low spo2 despite being on 15L [place]. Pt previously on [place] but had weaned down to regular [place]. Pt spo2 95% on 5L upon RT entering room but spo2 dropped to 83% after pt began coughing. Pt placed on 20L/100% HHFNC. Pt in no distress, no increased WOB, Clear/diminished BS throughout. RT will closely monitor and wean O2 as tolerated.

## 2022-01-10 NOTE — TOC Progression Note (Addendum)
Transition of Care [facility]) - Progression Note    Patient Details  Name: Jeff Willis MRN: [id number] Date of Birth: 06/20/1940  Transition of Care [facility]) CM/SW Contact  Graves-Bigelow, Ocie Cornfield, RN Phone Number: 01/10/2022, 11:11 AM  Clinical Narrative: Case Manager received a call from [employer] Coordinator with [facility] and insurance requested additional clinicals that have been faxed. Hope to hear from insurance later this evening regarding a decision. Case Manager will continue to follow for additional transition of care needs.   01-10-22 1513 Late Entry: Case Manager received notification from [place] that insurance has approved the inpatient rehab stay at [place]. Case Manager did speak with MD today regarding transitioning and the patient is not medically stable to transfer at this time. Patient is requiring high flow oxygen. Case Manager will continue to follow for transition of care needs.   Expected Discharge Plan: IP Rehab Facility Barriers to Discharge: Continued Medical Work up  Expected Discharge Plan and Services Expected Discharge Plan: [place] In-house Referral: NA Discharge Planning Services: CM Consult Post Acute Care Choice: IP Rehab Living arrangements for the past 2 months: Single Family Home                   DME Agency: NA    Readmission Risk Interventions    01/07/2022    4:36 PM  Readmission Risk Prevention Plan  Transportation Screening Complete  PCP or Specialist Appt within 3-5 Days Complete  HRI or Home Care Consult Complete  Social Work Consult for [place] Planning/Counseling Complete  Palliative Care Screening Not Applicable  Medication Review Press photographer) Referral to Pharmacy

## 2022-01-10 NOTE — Progress Notes (Signed)
NAME:  Jeff Willis, MRN:  [id number], DOB:  Nov 03, 1940, LOS: 38 ADMISSION DATE:  12/30/2021, CONSULTATION DATE:  12/31/21 REFERRING MD:  Dr. Jennette Kettle, CHIEF COMPLAINT:  IPF patient with acute Covid-19   History of Present Illness:  Jeff Willis -has known to have esophageal stenosis status post dilatation April 2023 by Dr. Saralyn Pilar on.  IPF on esbreit (FVC 84% and DLCO 45% - may 2023) on esbreit. On room air. Noral RHC 11/22/21.  Follows with Dr Chase Caller. HRCT 821'@3'$  with worsening.  Flew to [place] for a cruise.  On 12/24/2021 developed fever and cough.  On 12/26/2021 and the cruciate was diagnosed with COVID 19 acute infection.  Was offered antiviral Paxlovid but he refused because of negative experience previously with side effects.  He continued to have fever cough and failure to thrive, low appetite and being bedbound.  He returned to [place] via flight on 12/30/2021 and at home was continuing to cough.  Pulse ox check at home showed hypoxemia and therefore was brought to the emergency department via EMS.  Here he is resting on 3 L nasal cannula with stable work of breathing.  COVID PCR is positive.  CT chest with contrast [none PE protocol but no big PE noted.  He has left greater than right diffuse groundglass opacities.  Pertinent  Medical History  IPF on esbriet, UIP, esophageal stenosis s/p dilatation 2023 Back pain, Elevated PSA, Essential tremor, Hypercholesteremia, Neuropathy, Prostate cancer ([place]), and Sinus tachycardia (01/30/2018).   Reports that he has never smoked. He has never used smokeless tobacco.   Has a past surgical history that includes Lumbar microdiscectomy; Radioactive seed implant (N/A, 02/24/2018); SPACE OAR INSTILLATION (N/A, 02/24/2018); Esophagogastroduodenoscopy (egd) with propofol (N/A, 08/10/2021); Colonoscopy with propofol (N/A, 08/10/2021); Esophageal dilation (08/10/2021); polypectomy (08/10/2021); Hemostasis clip placement (08/10/2021); and RIGHT  HEART CATH (N/A, 11/22/2021).  Significant Hospital Events: Including procedures, antibiotic start and stop dates in addition to other pertinent events   12/30/2021 - admitted 9/4- BiPAP, transferred to ICU 9/5- Intubated 0518 d/t respiratory failure, A line placed, levophed [id number] 9/8-extubated 9/11-transferred to [facility] 9/12-CTA chest negative for acute PE, KUB neg 9/14-CXR with some worsening of lower lobes bilaterally, worsening hypoxia requiring high flow nasal cannula to 20 L  Interim History / Subjective:  01/01/2022 -  Dual virus infection - rhinovirus and covid. Had some hemoptysis -> lovenox held -> then Suddnt worsening respiatory distress over 45 minutes -> no furthe hemoptysis - RR 30s, look of fear, higher o2 need 100% needing BiPAP . Also, A Fib RVR .  Son at bedside.  CXR diffuse GGO/ILD infiltrates, Intubated at 0518, Arterial line placed. Low pressures/bradycardia > gave bolus of IV fluid > EKG low voltage > Echocardiogram ordered > Levophed needed from [phone] 9/6 fluid bolus for pressures, off pressors 9/7 AM some agitation / asynchrony with the vent - PRN versed given, switched to propofol from precedex for deeper sedation > propofol caused some hypotension > switched back to precedex   Objective   Blood pressure 111/67, pulse 78, temperature (!) 97.5 F (36.4 C), temperature source Axillary, resp. rate (!) 30, height '5\' 6"'$  (1.676 m), weight 57.7 kg, SpO2 94 %.    FiO2 (%):  [100 %] 100 %   Intake/Output Summary (Last 24 hours) at 01/10/2022 1553 Last data filed at 01/09/2022 2114 Gross per 24 hour  Intake --  Output 450 ml  Net -450 ml   UOP 950 (0.6) Filed Weights   01/08/22 0614 01/09/22  0550 01/10/22 0553  Weight: 58.7 kg 56.1 kg 57.7 kg   Examination: General: Critically ill-appearing but alert and interactive to questions, pupils equal, responsive to voice and answering questions appropriately HENT: Normocephalic, atraumatic, nasal cannula in place, moist  mucous membranes, no scleral icterus Lungs: Anterior lung fields with diminished breath sounds in upper fields and bibasilar crackles present on examination, no wheezes, no retractions or nasal flaring on high flow nasal cannula  Cardiovascular: Regular rate and rhythm, no murmurs or gallops, 2+ radial pulses bilaterally, brisk capillary refill Abdomen: Soft, mildly tender to palpation, + bowel sounds Extremities: Spontaneously moves extremities, no lower extremity edema, warm and well-perfused Neuro: Awake and alert and responsive to all questions, no focal deficits   Bcx no growth 4 days Na 134  K 4.2 Cr 0.91 WBC 11.8 from 10.8 Hgb 12 from 11.8`  Resolved Hospital Problem list     Assessment & Plan:  Acute IPF flare in setting of Covid-19 and Rhinovirus  ? Possible Aspiration Events CXR with some worsening in lower lung fields and increased oxygen requirements.  Had event with more coughing after eating yesterday as well.   - Keep O2 > 88% -Transfer to ICU to monitor on high flow nasal cannula, high risk for intubation - continue lovenox as high risk for DVT - Steroids for flare- solumedrol 60 mg IV started, if improving significantly tomorrow can consider a quick taper - Empiric CAP coverage completed during hospitalization, will add on Unasyn for anaerobic coverage for possible aspiration event-low threshold to discontinue if improvement - Lasix 40 IV x 1 -N.p.o. except sips with meds  HypotensionBradycardia, resolved S/p levophed on 9/5. MAPS improved today -Lopressor 12.5 twice daily  Oliguria 0.7 ml/kg/hr today. Cr 0.91 -monitor UOP -Lasix 40 x 1   Hyperkalemia,resolved -monitor  DM -sSSI -6 u BID levemir  Elevated d dimer likely secondary to COVID CTA on 9/12 no acute PE, monitor -No PE noted on CT with contrast on admission -LE duplex is negative  Elevated Troponin -Likely demand. Echo without wall motion abnormalities  Atrial tachycardia/A fib. episode    In normal sinus rhythm now, rates controlled -lopressor -telemetry -Keep K greater than 4 and mag greater than 2  Known esophageal dilatation April 2023 History of dysphagia for solids and spices 01/01/22 - some blood in OG post intubation, no bleeding currently -N.p.o. -PPI BID  Mild to moderate hyponatremia, resolved - new Na 128 at admit. Hypophostaemia - 01/01/22; replaced Mild hypomagnesemia - 01/01/22 Hypokalemia-01/01/22  replete prn   Anemia of chronic disease - Present on Admit Stable, no active bleeding/hemoptysis -monitor while on lovenox   Mild protein calorie malnutrition due to acute illness - Present on Admit -tube feeds  Failure to thrive due to acute illness and weight loss from IPF - Present on Admit Plan  - monitor  Best Practice (right click and "Reselect all SmartList Selections" daily)   Diet/type: tubefeeds DVT prophylaxis: lovenox GI prophylaxis: PPI Lines: Arterial Line, PICC line Foley:  No urinary cath Code Status:  limited Last date of multidisciplinary goals of care discussion 9/6  Labs   CBC: Recent Labs  Lab 01/06/22 0549 01/07/22 0500 01/08/22 0433 01/09/22 0420 01/10/22 0307  WBC 13.9* 13.1* 11.2* 10.8* 11.8*  NEUTROABS 11.5* 10.8* 9.1* 8.0* 9.5*  HGB 12.2* 11.8* 12.2* 11.8* 12.0*  HCT 35.8* 36.0* 34.3* 33.7* 33.9*  MCV 95.7 97.8 93.2 93.9 93.6  PLT 280 257 222 210 207     Basic Metabolic Panel: Recent Labs  Lab 01/04/22 0320 01/05/22 0500 01/06/22 0549 01/07/22 0500 01/08/22 0433 01/09/22 0420 01/10/22 0307  NA 140 139  --   --  136  --  134*  K 4.7 4.3  --   --  4.1  --  4.2  CL 104 102  --   --  102  --  102  CO2 28 26  --   --  31  --  26  GLUCOSE 90 151*  --   --  97  --  101*  BUN 31* 45*  --   --  36*  --  30*  CREATININE 0.83 1.22  --   --  0.93  --  0.91  CALCIUM 8.0* 8.5*  --   --  8.8*  --  8.7*  MG 2.3 2.6* 2.4 2.5* 2.1 1.9 2.0  PHOS 2.5 4.8* 4.8* 4.1 3.5 3.1 3.3    GFR: Estimated Creatinine Clearance:  52.8 mL/min (by C-G formula based on SCr of 0.91 mg/dL). Recent Labs  Lab 01/07/22 0500 01/08/22 0433 01/09/22 0420 01/10/22 0307  WBC 13.1* 11.2* 10.8* 11.8*     Liver Function Tests: Recent Labs  Lab 01/04/22 0320 01/05/22 0500  AST 36 38  ALT 50* 56*  ALKPHOS 60 68  BILITOT 0.5 0.8  PROT 4.5* 5.6*  ALBUMIN 1.8* 2.5*    No results for input(s): "LIPASE", "AMYLASE" in the last 168 hours. No results for input(s): "AMMONIA" in the last 168 hours.  ABG    Component Value Date/Time   PHART 7.428 01/03/2022 1525   PCO2ART 49.0 (H) 01/03/2022 1525   PO2ART 136 (H) 01/03/2022 1525   HCO3 32.5 (H) 01/03/2022 1525   TCO2 34 (H) 01/03/2022 1525   O2SAT 99 01/03/2022 1525     Coagulation Profile: No results for input(s): "INR", "PROTIME" in the last 168 hours.   Cardiac Enzymes: No results for input(s): "CKTOTAL", "CKMB", "CKMBINDEX", "TROPONINI" in the last 168 hours.  HbA1C: Hgb A1c MFr Bld  Date/Time Value Ref Range Status  01/01/2022 05:30 AM 5.8 (H) 4.8 - 5.6 % Final    Comment:    (NOTE) Pre diabetes:          5.7%-6.4%  Diabetes:              >6.4%  Glycemic control for   <7.0% adults with diabetes     CBG: Recent Labs  Lab 01/09/22 1345 01/09/22 1638 01/09/22 2110 01/10/22 0805 01/10/22 1313  GLUCAP 196* 177* 260* 83 132*     Review of Systems:   Intubated and sedated  Past Medical History:  He,  has a past medical history of Back pain, Elevated PSA, Essential tremor, Hypercholesteremia, Neuropathy, Prostate cancer ([place]), and Sinus tachycardia (01/30/2018).   Surgical History:   Past Surgical History:  Procedure Laterality Date   COLONOSCOPY WITH PROPOFOL N/A 08/10/2021   Procedure: COLONOSCOPY WITH PROPOFOL;  Surgeon: Carol Ada, MD;  Location: [facility];  Service: Gastroenterology;  Laterality: N/A;   ESOPHAGEAL DILATION  08/10/2021   Procedure: ESOPHAGEAL DILATION;  Surgeon: Carol Ada, MD;  Location: [facility];  Service:  Gastroenterology;;   ESOPHAGOGASTRODUODENOSCOPY (EGD) WITH PROPOFOL N/A 08/10/2021   Procedure: ESOPHAGOGASTRODUODENOSCOPY (EGD) WITH PROPOFOL;  Surgeon: Carol Ada, MD;  Location: [facility];  Service: Gastroenterology;  Laterality: N/A;   HEMOSTASIS CLIP PLACEMENT  08/10/2021   Procedure: HEMOSTASIS CLIP PLACEMENT;  Surgeon: Carol Ada, MD;  Location: [facility];  Service: Gastroenterology;;   LUMBAR MICRODISCECTOMY     L4-5  POLYPECTOMY  08/10/2021   Procedure: POLYPECTOMY;  Surgeon: Carol Ada, MD;  Location: [facility];  Service: Gastroenterology;;   RADIOACTIVE SEED IMPLANT N/A 02/24/2018   Procedure: RADIOACTIVE SEED IMPLANT/BRACHYTHERAPY IMPLANT;  Surgeon: Festus Aloe, MD;  Location: [facility];  Service: Urology;  Laterality: N/A;   RIGHT HEART CATH N/A 11/22/2021   Procedure: RIGHT HEART CATH;  Surgeon: Troy Sine, MD;  Location: [facility];  Service: Cardiovascular;  Laterality: N/A;   SPACE OAR INSTILLATION N/A 02/24/2018   Procedure: SPACE OAR INSTILLATION;  Surgeon: Festus Aloe, MD;  Location[facility];  Service: Urology;  Laterality: N/A;     Social History:   reports that he has never smoked. He has never used smokeless tobacco. He reports that he does not drink alcohol and does not use drugs.   Family History:  His family history includes Other in his father and mother. There is no history of Cancer.   Allergies Allergies  Allergen Reactions   Beef-Derived Products    Chicken Protein    Fish-Derived Products    Meat Extract     vegetarian   Ofev [Nintedanib] Other (See Comments)    Weight loss   Pork-Derived Products      Home Medications  Prior to Admission medications   Medication Sig Start Date End Date Taking? Authorizing Provider  aspirin EC 81 MG tablet Take 81 mg by mouth daily. Swallow whole.   Yes [provider]  metoprolol succinate (TOPROL XL) 25 MG 24 hr tablet Take 1  tablet (25 mg total) by mouth daily. 02/05/21  Yes Troy Sine, MD  pantoprazole (PROTONIX) 40 MG tablet Take 40 mg by mouth daily as needed (heartburn). 12/16/19  Yes [provider]  Pirfenidone (ESBRIET) 267 MG TABS Take 2 tablets (534 mg total) by mouth in the morning, at noon, and at bedtime. **LOW DOSE AS MAINTENANCE** Patient taking differently: Take 267 mg by mouth in the morning, at noon, and at bedtime. **LOW DOSE AS MAINTENANCE** 10/25/21  Yes Brand Males, MD  rosuvastatin (CRESTOR) 10 MG tablet Take 10 mg by mouth at bedtime. 10/04/21  Yes [provider]     Critical care time:     Gerrit Heck MD FM PGY-2

## 2022-01-11 DIAGNOSIS — J849 Interstitial pulmonary disease, unspecified: Secondary | ICD-10-CM | POA: Diagnosis not present

## 2022-01-11 DIAGNOSIS — E43 Unspecified severe protein-calorie malnutrition: Secondary | ICD-10-CM | POA: Diagnosis not present

## 2022-01-11 DIAGNOSIS — U071 COVID-19: Secondary | ICD-10-CM | POA: Diagnosis not present

## 2022-01-11 DIAGNOSIS — J9601 Acute respiratory failure with hypoxia: Secondary | ICD-10-CM | POA: Diagnosis not present

## 2022-01-11 LAB — GLUCOSE, CAPILLARY
Glucose-Capillary: 135 mg/dL — ABNORMAL HIGH (ref 70–99)
Glucose-Capillary: 110 mg/dL — ABNORMAL HIGH (ref 70–99)
Glucose-Capillary: 139 mg/dL — ABNORMAL HIGH (ref 70–99)
Glucose-Capillary: 226 mg/dL — ABNORMAL HIGH (ref 70–99)
Glucose-Capillary: 215 mg/dL — ABNORMAL HIGH (ref 70–99)
Glucose-Capillary: 155 mg/dL — ABNORMAL HIGH (ref 70–99)

## 2022-01-11 LAB — MAGNESIUM: Magnesium: 1.9 mg/dL (ref 1.7–2.4)

## 2022-01-11 LAB — CBC WITH DIFFERENTIAL/PLATELET
Abs Immature Granulocytes: 0.1 10*3/uL — ABNORMAL HIGH (ref 0.00–0.07)
Basophils Absolute: 0 10*3/uL (ref 0.0–0.1)
Basophils Relative: 0 %
Eosinophils Absolute: 0 10*3/uL (ref 0.0–0.5)
Eosinophils Relative: 0 %
HCT: 33.8 % — ABNORMAL LOW (ref 39.0–52.0)
Hemoglobin: 11.8 g/dL — ABNORMAL LOW (ref 13.0–17.0)
Immature Granulocytes: 1 %
Lymphocytes Relative: 4 %
Lymphs Abs: 0.8 10*3/uL (ref 0.7–4.0)
MCH: 33 pg (ref 26.0–34.0)
MCHC: 34.9 g/dL (ref 30.0–36.0)
MCV: 94.4 fL (ref 80.0–100.0)
Monocytes Absolute: 0.4 10*3/uL (ref 0.1–1.0)
Monocytes Relative: 2 %
Neutro Abs: 20.8 10*3/uL — ABNORMAL HIGH (ref 1.7–7.7)
Neutrophils Relative %: 93 %
Platelets: 187 10*3/uL (ref 150–400)
RBC: 3.58 MIL/uL — ABNORMAL LOW (ref 4.22–5.81)
RDW: 12.8 % (ref 11.5–15.5)
WBC: 22.1 10*3/uL — ABNORMAL HIGH (ref 4.0–10.5)
nRBC: 0 % (ref 0.0–0.2)

## 2022-01-11 LAB — BASIC METABOLIC PANEL
Anion gap: 7 (ref 5–15)
BUN: 30 mg/dL — ABNORMAL HIGH (ref 8–23)
CO2: 27 mmol/L (ref 22–32)
Calcium: 8.5 mg/dL — ABNORMAL LOW (ref 8.9–10.3)
Chloride: 101 mmol/L (ref 98–111)
Creatinine, Ser: 1 mg/dL (ref 0.61–1.24)
GFR, Estimated: >60 mL/min (ref >60)
Glucose, Bld: 121 mg/dL — ABNORMAL HIGH (ref 70–99)
Potassium: 4.1 mmol/L (ref 3.5–5.1)
Sodium: 135 mmol/L (ref 135–145)

## 2022-01-11 LAB — PHOSPHORUS: Phosphorus: 4.5 mg/dL (ref 2.5–4.6)

## 2022-01-11 MED ORDER — POLYETHYLENE GLYCOL 3350 17 G PO PACK
17.0000 g | PACK | Freq: Two times a day (BID) | ORAL | Status: DC
  Administered 2022-01-11 – 2022-01-14 (×4): 17 g via ORAL
  Filled 2022-01-11 (×10): qty 1

## 2022-01-11 NOTE — Progress Notes (Signed)
PROGRESS NOTE    Jeff Willis  IRW:[id number] DOB: [birth year] DOA: 12/30/2021 PCP: Gaynelle Arabian, MD   Brief Narrative: 81 year old with past medical history significant for known esophageal stenosis status post dilation April 2023, IPF on Esbriet (FVC 84% and DLCO 45 % May 2023) patient flew to [place] for a cruise, on 12/24/2021 developed fever cough.  On 12/26/2021 on the cruise he was diagnosed with COVID-19 infection, declined Paxlovid at that time, due to previously experienced side effects from it.  Continue to have cough, failure to thrive, returned to [place] via flight 12/30/2021.  Pulse ox was checked and was found to be hypoxic presented via EMS.  COVID PCR positive.  CT chest with contrast negative for PE, CTA chest neg for PE. Admitted to the hospital 12/30/2021 with acute hypoxic respiratory failure secondary to COVID-19. On 9/5 was intubated due to respiratory failure and extubated on 9/8. TRH assumed care.    Assessment & Plan:   Principal Problem:   Acute respiratory failure with hypoxia (HCC) Active Problems:   Interstitial lung disease ([place])   COVID-19 virus infection   Hyponatremia   History of sinus tachycardia   Pneumonia due to COVID-19 virus   Protein-calorie malnutrition, severe   Pressure injury of skin  Acute Hypoxic respiratory failure IPF flare in the setting of COVID-19 and rhinovirus Possible aspiration pneumonia on 01/10/2022 Patient subsequently 9/5 developed worsening respiratory failure requiring intubation, extubated on 9/8 Now requiring about 20 L of high flow nasal cannula on 9/14, PCCM reconsulted CTA chest negative for PE Completed tapered dose of dexamethasone on 01/10/2022, start Solu-Medrol on 9/14 as per PCCM Completed 5 days antibiotics, started Unasyn on 01/10/2022 Continue with flutter valve, PRN Mucinex.  Patient needs to be on airborne precaution for covid for 21 days from 8/30 (Covid test perform at the cruise) . Off isolation  9/21 High risk for re-intubation-transfer to ICU Patient accepted to [facility] inpatient rehab prior to his deterioration, on hold for now  Abdominal distention/?Constipation Reports no BM for the past few days, passing gas Noted some distention, denies any significant pain Abdominal x-ray unremarkable, no bowel dilatation, no significant stool burden is noted Continue laxatives  Tachycardia/A-fib Hypertension Demand ischemia Continue with metoprolol PRN hydralazine.   Diabetes mellitus type 2 SSI, Accu-Cheks, hypoglycemic protocol  Known esophageal dilation April 2023 History of dysphagia for solid and spices PPI-- change to BID.  MBS: No evidence of aspiration,  A 13 mm pill  lodged briefly in the vallecular space, then above the UES.  It passed through the UES into the cervical esophagus, then returned to the hypopharynx.  A pureed bolus helped pass it through the esophagus without incident/  Patient to follow regular diet but to avoid large bolus of food, hard meat, should eat soft food.  He relates food has been going down fine. Plan to monitor for worsening symptoms.    Anemia of chronic disease Hb stable.   Severe protein caloric malnutrition due to acute illness Failure to thrive due to acute illness, weight loss from IPF -BSM: Diet advanced to regular, soft, avoid meat, bread or large amount of bolus.  -Nutritionist consulted.   Back pain: acute on chronic. Spondylosis.  Started low dose gabapentin 100 mg TID 9/10. Continue with current dose. Pain is getting better, unable to tolerate high dose. Continue with oxycodone PRN -MRI Lumbar Spine: 1. L5-S1 right paracentral disc extrusion with caudal migration, new from the prior exam, which effaces the right lateral recess and  contacts the descending right S1 nerve roots. Edema and enhancement at the superior aspect of L4, which appears concentrated about the superior endplate, with edema in the intervertebral disc, and a  smaller focus of similar abnormal signal at the inferior endplate of L3. These findings are nonspecific and can be seen in the setting of degenerative changes/acute Schmorl's node formation, which can be painful, although early discitis-osteomyelitis can appear similar. L3-L4 mild spinal canal stenosis, unchanged. L4-L5 and L5-S1 mild bilateral neural foraminal narrowing, unchanged. Neurosurgery consulted, they think MRI finding consistent with degenerative spondylosis throughout spine. No need for acute intervention.  Blood culture: no growth to date/ Blood culture from admission negative. Recent ESR normal, CRP normal. Inflammatory marker initially elevate on admission likely related to covid.     Elevated D dimer; in setting of covid Doppler LE negative for DVT On Lovenox. Level decreasing.   See wound care documentation below.  Pressure Injury 01/05/22 Buttocks Right Stage 2 -  Partial thickness loss of dermis presenting as a shallow open injury with a red, pink wound bed without slough. (Active)  01/05/22 2130  Location: Buttocks  Location Orientation: Right  Staging: Stage 2 -  Partial thickness loss of dermis presenting as a shallow open injury with a red, pink wound bed without slough.  Wound Description (Comments):   Present on Admission:   Dressing Type Foam - Lift dressing to assess site every shift 01/11/22 0800     Nutrition Problem: Severe Malnutrition Etiology: chronic illness    Signs/Symptoms: severe muscle depletion, severe fat depletion    Interventions: Ensure Enlive (each supplement provides 350kcal and 20 grams of protein), MVI, Liberalize Diet  Estimated body mass index is 18.65 kg/m as calculated from the following:   Height as of this encounter: _0  (1.676 m).   Weight as of this encounter: 52.4 kg.   DVT prophylaxis: Lovenox Code Status: Full code Family Communication: Son at bedside Disposition Plan:  Status is: Inpatient Remains inpatient  appropriate because: management resp failure    Consultants:  CCM  Procedures:  ECHO;   Antimicrobials:    Subjective: Patient reports feeling better today, denies any new complaints.  Still requiring about 20 L of high flow nasal cannula.  Objective: Vitals:   01/11/22 1500 01/11/22 1600 01/11/22 1700 01/11/22 1745  BP: 117/76 113/61 (!) 137/55   Pulse: 92 93 100   Resp: 19 (!) 22 (!) 31   Temp: 97.8 F (36.6 C)     TempSrc:      SpO2: 99% 100% 100% 100%  Weight:      Height:        Intake/Output Summary (Last 24 hours) at 01/11/2022 1937 Last data filed at 01/11/2022 1712 Gross per 24 hour  Intake 300 ml  Output 1550 ml  Net -1250 ml   Filed Weights   01/09/22 0550 01/10/22 0553 01/11/22 0500  Weight: 56.1 kg 57.7 kg 52.4 kg    Examination: General: NAD  Cardiovascular: S1, S2 present Respiratory: Mild bibasilar crackles noted, diminished breath sounds Abdomen: Soft, nontender, +distended, bowel sounds present Musculoskeletal: No bilateral pedal edema noted Skin: Normal Psychiatry: Normal mood    Data Reviewed: I have personally reviewed following labs and imaging studies  CBC: Recent Labs  Lab 01/07/22 0500 01/08/22 0433 01/09/22 0420 01/10/22 0307 01/11/22 0532  WBC 13.1* 11.2* 10.8* 11.8* 22.1*  NEUTROABS 10.8* 9.1* 8.0* 9.5* 20.8*  HGB 11.8* 12.2* 11.8* 12.0* 11.8*  HCT 36.0* 34.3* 33.7* 33.9* 33.8*  MCV  97.8 93.2 93.9 93.6 94.4  PLT 257 222 210 207 768   Basic Metabolic Panel: Recent Labs  Lab 01/05/22 0500 01/06/22 0549 01/07/22 0500 01/08/22 0433 01/09/22 0420 01/10/22 0307 01/11/22 0532  NA 139  --   --  136  --  134* 135  K 4.3  --   --  4.1  --  4.2 4.1  CL 102  --   --  102  --  102 101  CO2 26  --   --  31  --  26 27  GLUCOSE 151*  --   --  97  --  101* 121*  BUN 45*  --   --  36*  --  30* 30*  CREATININE 1.22  --   --  0.93  --  0.91 1.00  CALCIUM 8.5*  --   --  8.8*  --  8.7* 8.5*  MG 2.6*   < > 2.5* 2.1 1.9 2.0 1.9   PHOS 4.8*   < > 4.1 3.5 3.1 3.3 4.5   < > = values in this interval not displayed.   GFR: Estimated Creatinine Clearance: 43.7 mL/min (by C-G formula based on SCr of 1 mg/dL). Liver Function Tests: Recent Labs  Lab 01/05/22 0500  AST 38  ALT 56*  ALKPHOS 68  BILITOT 0.8  PROT 5.6*  ALBUMIN 2.5*   No results for input(s): "LIPASE", "AMYLASE" in the last 168 hours. No results for input(s): "AMMONIA" in the last 168 hours. Coagulation Profile: No results for input(s): "INR", "PROTIME" in the last 168 hours.  Cardiac Enzymes: No results for input(s): "CKTOTAL", "CKMB", "CKMBINDEX", "TROPONINI" in the last 168 hours. BNP (last 3 results) Recent Labs    09/07/21 1218  PROBNP 211.0*   HbA1C: No results for input(s): "HGBA1C" in the last 72 hours. CBG: Recent Labs  Lab 01/10/22 2357 01/11/22 0423 01/11/22 0820 01/11/22 1132 01/11/22 1708  GLUCAP 133* 135* 110* 139* 226*   Lipid Profile: No results for input(s): "CHOL", "HDL", "LDLCALC", "TRIG", "CHOLHDL", "LDLDIRECT" in the last 72 hours.  Thyroid Function Tests: No results for input(s): "TSH", "T4TOTAL", "FREET4", "T3FREE", "THYROIDAB" in the last 72 hours. Anemia Panel: No results for input(s): "VITAMINB12", "FOLATE", "FERRITIN", "TIBC", "IRON", "RETICCTPCT" in the last 72 hours.  Sepsis Labs: No results for input(s): "PROCALCITON", "LATICACIDVEN" in the last 168 hours.  Recent Results (from the past 240 hour(s))  Culture, blood (Routine X 2) w Reflex to ID Panel     Status: None (Preliminary result)   Collection Time: 01/07/22  9:08 AM   Specimen: BLOOD LEFT ARM  Result Value Ref Range Status   Specimen Description BLOOD LEFT ARM  Final   Special Requests   Final    BOTTLES DRAWN AEROBIC AND ANAEROBIC Blood Culture adequate volume   Culture   Final    NO GROWTH 4 DAYS Performed at [facility], [address] [place], [place] [address]    Report Status PENDING  Incomplete  Culture, blood (Routine X  2) w Reflex to ID Panel     Status: None (Preliminary result)   Collection Time: 01/07/22  9:08 AM   Specimen: BLOOD LEFT HAND  Result Value Ref Range Status   Specimen Description BLOOD LEFT HAND  Final   Special Requests   Final    BOTTLES DRAWN AEROBIC AND ANAEROBIC Blood Culture adequate volume   Culture   Final    NO GROWTH 4 DAYS Performed at [facility],  [address], [place], [place] [address]    Report Status PENDING  Incomplete         Radiology Studies: DG CHEST PORT 1 VIEW  Result Date: 01/10/2022 CLINICAL DATA:  COVID positive. Shortness of breath, tachycardia and hypoxia. EXAM: PORTABLE CHEST 1 VIEW COMPARISON:  Chest x-ray 01/04/2022 and chest CT 01/08/2022 FINDINGS: The cardiac silhouette, mediastinal and hilar contours are stable. Persistent diffuse changes of interstitial pneumonitis, likely viral/COVID pneumonia. No definite pleural effusions. No pneumothorax. IMPRESSION: Persistent changes of viral/COVID pneumonia. Electronically Signed   By: Marijo Sanes M.D.   On: 01/10/2022 14:39        Scheduled Meds:  ascorbic acid  500 mg Oral Daily   aspirin  81 mg Oral Daily   Chlorhexidine Gluconate Cloth  6 each Topical Daily   enoxaparin (LOVENOX) injection  40 mg Subcutaneous Q24H   feeding supplement  237 mL Oral BID BM   gabapentin  100 mg Oral TID   Glycerin (Adult)  1 suppository Rectal Once   insulin aspart  0-9 Units Subcutaneous Q4H   insulin detemir  6 Units Subcutaneous Q12H   lidocaine  1 patch Transdermal Q24H   methylPREDNISolone (SOLU-MEDROL) injection  60 mg Intravenous Daily   metoprolol tartrate  12.5 mg Oral BID   multivitamin with minerals  1 tablet Oral Daily   pantoprazole  40 mg Oral BID   polyethylene glycol  17 g Oral BID   rosuvastatin  10 mg Oral QHS   senna  2 tablet Oral Daily   sodium chloride flush  10-40 mL Intracatheter Q12H   zinc sulfate  220 mg Oral Daily   Continuous Infusions:  sodium chloride Stopped  (01/01/22 0609)   ampicillin-sulbactam (UNASYN) IV 3 g (01/11/22 1712)     LOS: 11 days       Alma Friendly, MD [facility]   If 7PM-7AM, please contact night-coverage [email or web address]  01/11/2022, 7:37 PM

## 2022-01-11 NOTE — Telephone Encounter (Signed)
FYI:  Patient is currently admitted in ICU at [facility].   Please be aware sir

## 2022-01-11 NOTE — Progress Notes (Addendum)
NAME:  Jeff Willis, MRN:  [id number], DOB:  1941/04/17, LOS: 17 ADMISSION DATE:  12/30/2021, CONSULTATION DATE:  12/31/21 REFERRING MD:  Dr. Jennette Kettle, CHIEF COMPLAINT:  IPF patient with acute Covid-19   History of Present Illness:  Jeff Willis -has known to have esophageal stenosis status post dilatation April 2023 by Dr. Saralyn Pilar on.  IPF on esbreit (FVC 84% and DLCO 45% - may 2023) on esbreit. On room air. Noral RHC 11/22/21.  Follows with Dr Chase Caller. HRCT 821'@3'$  with worsening.  Flew to [place] for a cruise.  On 12/24/2021 developed fever and cough.  On 12/26/2021 and the cruciate was diagnosed with COVID 19 acute infection.  Was offered antiviral Paxlovid but he refused because of negative experience previously with side effects.  He continued to have fever cough and failure to thrive, low appetite and being bedbound.  He returned to [place] via flight on 12/30/2021 and at home was continuing to cough.  Pulse ox check at home showed hypoxemia and therefore was brought to the emergency department via EMS.  Here he is resting on 3 L nasal cannula with stable work of breathing.  COVID PCR is positive.  CT chest with contrast [none PE protocol but no big PE noted.  He has left greater than right diffuse groundglass opacities.  Pertinent  Medical History  IPF on esbriet, UIP, esophageal stenosis s/p dilatation 2023 Back pain, Elevated PSA, Essential tremor, Hypercholesteremia, Neuropathy, Prostate cancer ([place]), and Sinus tachycardia (01/30/2018).   Reports that he has never smoked. He has never used smokeless tobacco.   Has a past surgical history that includes Lumbar microdiscectomy; Radioactive seed implant (N/A, 02/24/2018); SPACE OAR INSTILLATION (N/A, 02/24/2018); Esophagogastroduodenoscopy (egd) with propofol (N/A, 08/10/2021); Colonoscopy with propofol (N/A, 08/10/2021); Esophageal dilation (08/10/2021); polypectomy (08/10/2021); Hemostasis clip placement (08/10/2021); and RIGHT  HEART CATH (N/A, 11/22/2021).  Significant Hospital Events: Including procedures, antibiotic start and stop dates in addition to other pertinent events   12/30/2021 - admitted 9/4- BiPAP, transferred to ICU 9/5- Intubated 0518 d/t respiratory failure, A line placed, levophed [id number] 9/8-extubated 9/11-transferred to [facility] 9/12-CTA chest negative for acute PE, KUB neg 9/13-Possible aspiration event with hypoxemia noted by family 9/14-CXR with some worsening of RML, worsening hypoxia requiring high flow nasal cannula to 20 L 9/15 FiO2 weaned to 70%, still 20 L HFNC  Interim History / Subjective:  No issues overnight, was weaned to FiO2 of 70%/20 L HFNC. Still no BM and would like relief.    Objective   Blood pressure 112/63, pulse 68, temperature (!) 97.5 F (36.4 C), temperature source Oral, resp. rate 16, height '5\' 6"'$  (1.676 m), weight 52.4 kg, SpO2 100 %.    FiO2 (%):  [70 %-100 %] 70 %   Intake/Output Summary (Last 24 hours) at 01/11/2022 0733 Last data filed at 01/11/2022 0100 Gross per 24 hour  Intake 200 ml  Output 1300 ml  Net -1100 ml   UOP 950 (0.6) Filed Weights   01/09/22 0550 01/10/22 0553 01/11/22 0500  Weight: 56.1 kg 57.7 kg 52.4 kg   Examination: General: NAD, laying in bed comfortable HENT: NCAT, nasal canula in place, MMM Lungs: diminished in upper lung fields, bibasilar inspiratory crackles present, no retractions or iWOB Cardiovascular: RRR no m/r/g Abdomen: Mildly distended, nontender to palpation Extremities: No LE edema, warm, well perfused Neuro: alert and responsive to all questions  Bcx no growth 4 days Na 135 from 134  K 4.1  Cr 1.00 Phos 4.5 Mag 1.9  WBC 22.1 from 11.8 Hgb 11.8 from 12  Resolved Hospital Problem list     Assessment & Plan:  Acute IPF flare in setting of Covid-19 and Rhinovirus  ? Possible Aspiration Events CXR with some worsening in lower lung fields and increased oxygen requirements.  Had event with more coughing  after eating yesterday as well.  Continuing on 20 L HFNC. Received lasix x 1 yesterday - Keep O2 > 88% - In ICU to monitor on high flow nasal cannula, high risk for intubation - continue lovenox as high risk for DVT - Steroids for flare- solumedrol 60 mg IV started, if improving significantly can consider a quick taper - Empiric CAP coverage completed during hospitalization, will add on Unasyn for anaerobic coverage for possible aspiration event-low threshold to discontinue if improvement -N.p.o. except sips with meds  Known esophageal dilatation April 2023 History of dysphagia for solids and spices 01/01/22 - possible aspiration event  -N.p.o. -PPI BID  Constipation No BM for 5 days and feeling uncomfortable. S/p glycerin chip x 1. Was asking for enema -monitor, consider enema -senna -miralax   HypotensionBradycardia, resolved S/p levophed on 9/5. MAPS improved -Lopressor 12.5 twice daily  Oliguria, improved 1.0 ml/kg/hr today. Cr 1.00 today -monitor UOP   Hyperkalemia,resolved -monitor  DM -sSSI -6 u BID levemir  Elevated d dimer likely secondary to COVID CTA on 9/12 no acute PE, monitor -No PE noted on CT with contrast on admission -LE duplex is negative  Elevated Troponin -Likely demand. Echo without wall motion abnormalities  Atrial tachycardia/A fib. episode   In normal sinus rhythm now, rates controlled -lopressor -telemetry -Keep K greater than 4 and mag greater than 2  Mild to moderate hyponatremia, resolved - new Na 128 at admit. Hypophostaemia - 01/01/22; replaced Mild hypomagnesemia - 01/01/22 Hypokalemia-01/01/22  replete prn   Anemia of chronic disease - Present on Admit Stable, no active bleeding/hemoptysis -monitor while on lovenox   Mild protein calorie malnutrition due to acute illness - Present on Admit -tube feeds  Failure to thrive due to acute illness and weight loss from IPF - Present on Admit Plan  - monitor  Best Practice (right click  and "Reselect all SmartList Selections" daily)   Diet/type: tubefeeds DVT prophylaxis: lovenox GI prophylaxis: PPI Lines: Arterial Line, PICC line Foley:  No urinary cath Code Status:  limited Last date of multidisciplinary goals of care discussion 9/6  Labs   CBC: Recent Labs  Lab 01/07/22 0500 01/08/22 0433 01/09/22 0420 01/10/22 0307 01/11/22 0532  WBC 13.1* 11.2* 10.8* 11.8* 22.1*  NEUTROABS 10.8* 9.1* 8.0* 9.5* 20.8*  HGB 11.8* 12.2* 11.8* 12.0* 11.8*  HCT 36.0* 34.3* 33.7* 33.9* 33.8*  MCV 97.8 93.2 93.9 93.6 94.4  PLT 257 222 210 207 187     Basic Metabolic Panel: Recent Labs  Lab 01/05/22 0500 01/06/22 0549 01/07/22 0500 01/08/22 0433 01/09/22 0420 01/10/22 0307 01/11/22 0532  NA 139  --   --  136  --  134* 135  K 4.3  --   --  4.1  --  4.2 4.1  CL 102  --   --  102  --  102 101  CO2 26  --   --  31  --  26 27  GLUCOSE 151*  --   --  97  --  101* 121*  BUN 45*  --   --  36*  --  30* 30*  CREATININE 1.22  --   --  0.93  --  0.91 1.00  CALCIUM 8.5*  --   --  8.8*  --  8.7* 8.5*  MG 2.6*   < > 2.5* 2.1 1.9 2.0 1.9  PHOS 4.8*   < > 4.1 3.5 3.1 3.3 4.5   < > = values in this interval not displayed.    GFR: Estimated Creatinine Clearance: 43.7 mL/min (by C-G formula based on SCr of 1 mg/dL). Recent Labs  Lab 01/08/22 0433 01/09/22 0420 01/10/22 0307 01/11/22 0532  WBC 11.2* 10.8* 11.8* 22.1*     Liver Function Tests: Recent Labs  Lab 01/05/22 0500  AST 38  ALT 56*  ALKPHOS 68  BILITOT 0.8  PROT 5.6*  ALBUMIN 2.5*    No results for input(s): "LIPASE", "AMYLASE" in the last 168 hours. No results for input(s): "AMMONIA" in the last 168 hours.  ABG    Component Value Date/Time   PHART 7.428 01/03/2022 1525   PCO2ART 49.0 (H) 01/03/2022 1525   PO2ART 136 (H) 01/03/2022 1525   HCO3 32.5 (H) 01/03/2022 1525   TCO2 34 (H) 01/03/2022 1525   O2SAT 99 01/03/2022 1525     Coagulation Profile: No results for input(s): "INR", "PROTIME" in  the last 168 hours.   Cardiac Enzymes: No results for input(s): "CKTOTAL", "CKMB", "CKMBINDEX", "TROPONINI" in the last 168 hours.  HbA1C: Hgb A1c MFr Bld  Date/Time Value Ref Range Status  01/01/2022 05:30 AM 5.8 (H) 4.8 - 5.6 % Final    Comment:    (NOTE) Pre diabetes:          5.7%-6.4%  Diabetes:              >6.4%  Glycemic control for   <7.0% adults with diabetes     CBG: Recent Labs  Lab 01/10/22 1313 01/10/22 1612 01/10/22 2153 01/10/22 2357 01/11/22 0423  GLUCAP 132* 220* 125* 133* 135*     Review of Systems:   Intubated and sedated  Past Medical History:  He,  has a past medical history of Back pain, Elevated PSA, Essential tremor, Hypercholesteremia, Neuropathy, Prostate cancer ([place]), and Sinus tachycardia (01/30/2018).   Surgical History:   Past Surgical History:  Procedure Laterality Date   COLONOSCOPY WITH PROPOFOL N/A 08/10/2021   Procedure: COLONOSCOPY WITH PROPOFOL;  Surgeon: Carol Ada, MD;  Location: [facility];  Service: Gastroenterology;  Laterality: N/A;   ESOPHAGEAL DILATION  08/10/2021   Procedure: ESOPHAGEAL DILATION;  Surgeon: Carol Ada, MD;  Location: [facility];  Service: Gastroenterology;;   ESOPHAGOGASTRODUODENOSCOPY (EGD) WITH PROPOFOL N/A 08/10/2021   Procedure: ESOPHAGOGASTRODUODENOSCOPY (EGD) WITH PROPOFOL;  Surgeon: Carol Ada, MD;  Location: [facility];  Service: Gastroenterology;  Laterality: N/A;   HEMOSTASIS CLIP PLACEMENT  08/10/2021   Procedure: HEMOSTASIS CLIP PLACEMENT;  Surgeon: Carol Ada, MD;  Location: [facility];  Service: Gastroenterology;;   LUMBAR MICRODISCECTOMY     L4-5   POLYPECTOMY  08/10/2021   Procedure: POLYPECTOMY;  Surgeon: Carol Ada, MD;  Location: [facility];  Service: Gastroenterology;;   RADIOACTIVE SEED IMPLANT N/A 02/24/2018   Procedure: RADIOACTIVE SEED IMPLANT/BRACHYTHERAPY IMPLANT;  Surgeon: Festus Aloe, MD;  [facility];  Service: Urology;   Laterality: N/A;   RIGHT HEART CATH N/A 11/22/2021   Procedure: RIGHT HEART CATH;  Surgeon: Troy Sine, MD;  Location: [facility];  Service: Cardiovascular;  Laterality: N/A;   SPACE OAR INSTILLATION N/A 02/24/2018   Procedure: SPACE OAR INSTILLATION;  Surgeon: Festus Aloe, MD;  Location: [facility]  Service: Urology;  Laterality: N/A;     Social History:   reports that he has never smoked. He has never used smokeless tobacco. He reports that he does not drink alcohol and does not use drugs.   Family History:  His family history includes Other in his father and mother. There is no history of Cancer.   Allergies Allergies  Allergen Reactions   Beef-Derived Products    Chicken Protein    Fish-Derived Products    Meat Extract     vegetarian   Ofev [Nintedanib] Other (See Comments)    Weight loss   Pork-Derived Products      Home Medications  Prior to Admission medications   Medication Sig Start Date End Date Taking? Authorizing Provider  aspirin EC 81 MG tablet Take 81 mg by mouth daily. Swallow whole.   Yes [provider]  metoprolol succinate (TOPROL XL) 25 MG 24 hr tablet Take 1 tablet (25 mg total) by mouth daily. 02/05/21  Yes Troy Sine, MD  pantoprazole (PROTONIX) 40 MG tablet Take 40 mg by mouth daily as needed (heartburn). 12/16/19  Yes [provider]  Pirfenidone (ESBRIET) 267 MG TABS Take 2 tablets (534 mg total) by mouth in the morning, at noon, and at bedtime. **LOW DOSE AS MAINTENANCE** Patient taking differently: Take 267 mg by mouth in the morning, at noon, and at bedtime. **LOW DOSE AS MAINTENANCE** 10/25/21  Yes Brand Males, MD  rosuvastatin (CRESTOR) 10 MG tablet Take 10 mg by mouth at bedtime. 10/04/21  Yes [provider]     Critical care time:     Gerrit Heck MD [facility] PGY-2

## 2022-01-11 NOTE — Progress Notes (Signed)
Physical Therapy Treatment Patient Details Name: Jeff Willis MRN: [id number] DOB: Mar 11, 1941 Today's Date: 01/11/2022   History of Present Illness Pt is an 81 y.o. male admitted 12/30/21 with hypoxia and failure to thrive after returning home from flight to [place] on same day; of note, pt with recent COVID+ test 12/26/21 while on cruise. Workup for IPF flare secondary to COVID and rhinovirus. ETT 9/5-9/8. PMH includes esophageal stenosis, PAD, GERD, spinal stenosis.    PT Comments    Pt improving steadily toward goals, still limited by high O2 needs and isolation.  Emphasis on strengthening exercises in supine and in standing for strength,stability and stamina while monitoring oxygenation.    Recommendations for follow up therapy are one component of a multi-disciplinary discharge planning process, led by the attending physician.  Recommendations may be updated based on patient status, additional functional criteria and insurance authorization.  Follow Up Recommendations  Acute inpatient rehab (3hours/day)     Assistance Recommended at Discharge Frequent or constant Supervision/Assistance  Patient can return home with the following A little help with walking and/or transfers;A little help with bathing/dressing/bathroom;Assistance with cooking/housework;Assist for transportation;Help with stairs or ramp for entrance   Equipment Recommendations  Rolling walker (2 wheels);BSC/3in1    Recommendations for Other Services       Precautions / Restrictions Precautions Precautions: Fall (sats) Precaution Comments: Watch SpO2 (does not wear O2 baseline); urine incontinence (has Primofit)     Mobility  Bed Mobility Overal bed mobility: Needs Assistance Bed Mobility: Sit to Supine, Supine to Sit Rolling: Modified independent (Device/Increase time) Sidelying to sit: Min guard (HOB flat)   Sit to supine: Min assist   General bed mobility comments: needing extra time, mildly  effortful, up via L elbow.    Transfers Overall transfer level: Needs assistance     Sit to Stand: Min assist (x2)           General transfer comment: stability assist.    Ambulation/Gait             Pre-gait activities: w/shifting and stepping to warm up for standing exercise General Gait Details: marching in place, but stress on standing exercise   Stairs             Wheelchair Mobility    Modified Rankin (Stroke Patients Only)       Balance Overall balance assessment: Needs assistance   Sitting balance-Leahy Scale: Good     Standing balance support: Single extremity supported, Bilateral upper extremity supported, Reliant on assistive device for balance Standing balance-Leahy Scale: Poor Standing balance comment: reliant on AD and or external support                            Cognition Arousal/Alertness: Awake/alert Behavior During Therapy: Flat affect Overall Cognitive Status: Within Functional Limits for tasks assessed                                 General Comments: pleasant and cooperative; less verbally interactive with increased fatigue and SOB        Exercises General Exercises - Lower Extremity Straight Leg Raises: AROM, Both, 10 reps, Supine Hip Flexion/Marching: AROM, 10 reps, Standing, Both Mini-Sqauts: AROM, 10 reps, Standing Other Exercises Other Exercises: bicep/tricep presses x 10 reps bil with graded resistance Other Exercises: alternating reaches to ceiling  x10 reps each.    General Comments General comments (  skin integrity, edema, etc.): SpO2 on 15L during activity generally between 89 and 93% with 1-2 episodes of drop to 85%, therapist encouraging sitting rest.      Pertinent Vitals/Pain Pain Assessment Pain Assessment: Faces Faces Pain Scale: No hurt Pain Intervention(s): Monitored during session    Home Living                          Prior Function            PT  Goals (current goals can now be found in the care plan section) Acute Rehab PT Goals Patient Stated Goal: to go home PT Goal Formulation: With patient Time For Goal Achievement: 01/19/22 Potential to Achieve Goals: Good Progress towards PT goals: Progressing toward goals    Frequency    Min 3X/week      PT Plan Current plan remains appropriate    Co-evaluation              AM-PAC PT "6 Clicks" Mobility   Outcome Measure  Help needed turning from your back to your side while in a flat bed without using bedrails?: A Little Help needed moving from lying on your back to sitting on the side of a flat bed without using bedrails?: A Little Help needed moving to and from a bed to a chair (including a wheelchair)?: A Little Help needed standing up from a chair using your arms (e.g., wheelchair or bedside chair)?: A Little Help needed to walk in hospital room?: A Little Help needed climbing 3-5 steps with a railing? : A Lot 6 Click Score: 17    End of Session Equipment Utilized During Treatment: Oxygen Activity Tolerance: Patient tolerated treatment well;Patient limited by fatigue Patient left: in bed;with call bell/phone within reach;with family/visitor present Nurse Communication: Mobility status PT Visit Diagnosis: Unsteadiness on feet (R26.81);Muscle weakness (generalized) (M62.81)     Time: 5465-0354 PT Time Calculation (min) (ACUTE ONLY): 27 min  Charges:  $Therapeutic Exercise: 8-22 mins $Therapeutic Activity: 8-22 mins                     01/11/2022  Ginger Carne., PT Acute Rehabilitation Services [phone]  (office)   Tessie Fass Karson Chicas 01/11/2022, 4:38 PM

## 2022-01-11 NOTE — Evaluation (Signed)
Clinical/Bedside Swallow Evaluation Patient Details  Name: Jeff Willis MRN: [id number] Date of Birth: 11-Jan-1941  Today's Date: 01/11/2022 Time: SLP Start Time (ACUTE ONLY): 1034 SLP Stop Time (ACUTE ONLY): 1054 SLP Time Calculation (min) (ACUTE ONLY): 20 min  Past Medical History:  Past Medical History:  Diagnosis Date   Back pain    Elevated PSA    Essential tremor    Hypercholesteremia    Neuropathy    Prostate cancer ([place])    Sinus tachycardia 01/30/2018   Past Surgical History:  Past Surgical History:  Procedure Laterality Date   COLONOSCOPY WITH PROPOFOL N/A 08/10/2021   Procedure: COLONOSCOPY WITH PROPOFOL;  Surgeon: Carol Ada, MD;  Location: [facility];  Service: Gastroenterology;  Laterality: N/A;   ESOPHAGEAL DILATION  08/10/2021   Procedure: ESOPHAGEAL DILATION;  Surgeon: Carol Ada, MD;  Location: [facility];  Service: Gastroenterology;;   ESOPHAGOGASTRODUODENOSCOPY (EGD) WITH PROPOFOL N/A 08/10/2021   Procedure: ESOPHAGOGASTRODUODENOSCOPY (EGD) WITH PROPOFOL;  Surgeon: Carol Ada, MD;  Location: [facility];  Service: Gastroenterology;  Laterality: N/A;   HEMOSTASIS CLIP PLACEMENT  08/10/2021   Procedure: HEMOSTASIS CLIP PLACEMENT;  Surgeon: Carol Ada, MD;  Location: [facility];  Service: Gastroenterology;;   LUMBAR MICRODISCECTOMY     L4-5   POLYPECTOMY  08/10/2021   Procedure: POLYPECTOMY;  Surgeon: Carol Ada, MD;  Location: [facility];  Service: Gastroenterology;;   RADIOACTIVE SEED IMPLANT N/A 02/24/2018   Procedure: RADIOACTIVE SEED IMPLANT/BRACHYTHERAPY IMPLANT;  Surgeon: Festus Aloe, MD;  Location: [facility];  Service: Urology;  Laterality: N/A;   RIGHT HEART CATH N/A 11/22/2021   Procedure: RIGHT HEART CATH;  Surgeon: Troy Sine, MD;  Location: [facility];  Service: Cardiovascular;  Laterality: N/A;   SPACE OAR INSTILLATION N/A 02/24/2018   Procedure: SPACE OAR INSTILLATION;  Surgeon:  Festus Aloe, MD;  Location: [facility];  Service: Urology;  Laterality: N/A;   HPI:  Jeff Willis presented to [facility] ED on 12/30/21 with hypoxemia, fever, and cough that started on 8/28.  Dx'd with IPF flare in setting of COVID 19 and rhinovirus. Respiratory distress with intubation 9/5-8. MBS 9/11 with recs for reg/thin.  Possible aspiration event 9/14 with increased O2 requirements. PMHx of esophageal stenosis s/p dilation 08/09/2021. There was one benign-appearing, intrinsic mild stenosis found at the cricopharyngeus as well as multiple strictures at the UES. Esophagram 08/03/21: suspected cervical web, mild esophageal dysmotility, presbyesophagus, mild vallecular retention. PMHx includes idiopathic pulmonary fibrosis, essential tremor, neuropathy. Supportive family.    Assessment / Plan / Recommendation  Clinical Impression  Pt presents with known hx of esophageal dysphagia and mild pharyngeal deficits as noted on MBS 01/07/2022.  Pt with possible aspiration event on 9/14 with significantly increased O2 requirements.  Pt exhibited delayed throat clearing throughout today's assessment.  Pt attributed to feeling of stasis, not laryngeal penetration.  Pt required multiple swallows with all bolus trials, which was shown on MBS to be benefical in clearance of residuals.  There was a single strong cough following session as SLP was leaving room which sounded more congested in nature and different from throat clear.  Pt attributed this episode to chest congestion.  CXR 9/14 with no new acute findings, but with persistent changes from COVID pna.  Pt with CT chest ordered.  SLP to monitor results and conduct further testing, if indicated.  Of note, pt has not had BM in several days and esophageal dysmotility may be exacerbated by slowing in lower GI tract.  Overall, pt's performance today is seemingly consistent with results from [facility] on 9/11.    Recommend resuming regular texture diet  with thin liquids with esophageal precautions.   SLP Visit Diagnosis: Dysphagia, pharyngoesophageal phase (R13.14)    Aspiration Risk  No limitations    Diet Recommendation Regular;Thin liquid   Liquid Administration via: Cup;Straw Medication Administration: Crushed with puree Supervision: Patient able to self feed (Assist PRN) Compensations: Slow rate;Small sips/bites;Follow solids with liquid;Multiple dry swallows after each bite/sip Postural Changes: Seated upright at 90 degrees;Remain upright for at least 30 minutes after po intake    Other  Recommendations Recommended Consults: Consider GI evaluation (If symptoms persist or worsen) Oral Care Recommendations: Oral care BID    Recommendations for follow up therapy are one component of a multi-disciplinary discharge planning process, led by the attending physician.  Recommendations may be updated based on patient status, additional functional criteria and insurance authorization.  Follow up Recommendations No SLP follow up      Assistance Recommended at Discharge Intermittent Supervision/Assistance  Functional Status Assessment Patient has not had a recent decline in their functional status  Frequency and Duration  (N/A)          Prognosis Prognosis for Safe Diet Advancement:  (N/A)      Swallow Study   General Date of Onset: 12/30/21 HPI: Jeff Willis presented to [facility] ED on 12/30/21 with hypoxemia, fever, and cough that started on 8/28.  Dx'd with IPF flare in setting of COVID 19 and rhinovirus. Respiratory distress with intubation 9/5-8. MBS 9/11 with recs for reg/thin.  Possible aspiration event 9/14 with increased O2 requirements. PMHx of esophageal stenosis s/p dilation 08/09/2021. There was one benign-appearing, intrinsic mild stenosis found at the cricopharyngeus as well as multiple strictures at the UES. Esophagram 08/03/21: suspected cervical web, mild esophageal dysmotility, presbyesophagus, mild vallecular  retention. PMHx includes idiopathic pulmonary fibrosis, essential tremor, neuropathy. Supportive family. Type of Study: Bedside Swallow Evaluation Previous Swallow Assessment: MBS 9/11 Diet Prior to this Study: Regular;Thin liquids Temperature Spikes Noted: No Respiratory Status: Nasal cannula History of Recent Intubation: Yes Length of Intubations (days): 3 days Date extubated: 01/04/22 Behavior/Cognition: Alert;Cooperative;Pleasant mood Oral Cavity - Dentition: Adequate natural dentition Vision: Functional for self-feeding Self-Feeding Abilities: Able to feed self Patient Positioning: Upright in bed Baseline Vocal Quality: Normal Volitional Cough: Strong Volitional Swallow: Able to elicit    Oral/Motor/Sensory Function Overall Oral Motor/Sensory Function: Within functional limits Facial ROM: Within Functional Limits Facial Symmetry: Within Functional Limits Lingual ROM: Within Functional Limits Lingual Symmetry: Within Functional Limits Lingual Strength: Within Functional Limits Velum: Within Functional Limits Mandible: Within Functional Limits   Ice Chips Ice chips: Not tested   Thin Liquid Pharyngeal  Phase Impairments: Throat Clearing - Delayed;Multiple swallows    Nectar Thick Nectar Thick Liquid: Not tested   Honey Thick Honey Thick Liquid: Not tested   Puree Puree: Impaired Presentation: Spoon Pharyngeal Phase Impairments: Throat Clearing - Delayed   Solid     Solid: Impaired Pharyngeal Phase Impairments: Multiple swallows;Cough - Delayed      Celedonio Savage , MA, [place] Office: [phone] 01/11/2022,11:15 AM

## 2022-01-12 DIAGNOSIS — E43 Unspecified severe protein-calorie malnutrition: Secondary | ICD-10-CM | POA: Diagnosis not present

## 2022-01-12 DIAGNOSIS — J849 Interstitial pulmonary disease, unspecified: Secondary | ICD-10-CM | POA: Diagnosis not present

## 2022-01-12 DIAGNOSIS — U071 COVID-19: Secondary | ICD-10-CM | POA: Diagnosis not present

## 2022-01-12 DIAGNOSIS — J9601 Acute respiratory failure with hypoxia: Secondary | ICD-10-CM | POA: Diagnosis not present

## 2022-01-12 LAB — BASIC METABOLIC PANEL
Anion gap: 10 (ref 5–15)
BUN: 28 mg/dL — ABNORMAL HIGH (ref 8–23)
CO2: 26 mmol/L (ref 22–32)
Calcium: 8.4 mg/dL — ABNORMAL LOW (ref 8.9–10.3)
Chloride: 99 mmol/L (ref 98–111)
Creatinine, Ser: 0.89 mg/dL (ref 0.61–1.24)
GFR, Estimated: >60 mL/min (ref >60)
Glucose, Bld: 78 mg/dL (ref 70–99)
Potassium: 4.3 mmol/L (ref 3.5–5.1)
Sodium: 135 mmol/L (ref 135–145)

## 2022-01-12 LAB — CULTURE, BLOOD (ROUTINE X 2)
Culture: NO GROWTH
Culture: NO GROWTH
Special Requests: ADEQUATE
Special Requests: ADEQUATE

## 2022-01-12 LAB — CBC WITH DIFFERENTIAL/PLATELET
Abs Immature Granulocytes: 0.09 10*3/uL — ABNORMAL HIGH (ref 0.00–0.07)
Basophils Absolute: 0 10*3/uL (ref 0.0–0.1)
Basophils Relative: 0 %
Eosinophils Absolute: 0.1 10*3/uL (ref 0.0–0.5)
Eosinophils Relative: 0 %
HCT: 31.7 % — ABNORMAL LOW (ref 39.0–52.0)
Hemoglobin: 11 g/dL — ABNORMAL LOW (ref 13.0–17.0)
Immature Granulocytes: 1 %
Lymphocytes Relative: 9 %
Lymphs Abs: 1.6 10*3/uL (ref 0.7–4.0)
MCH: 33.1 pg (ref 26.0–34.0)
MCHC: 34.7 g/dL (ref 30.0–36.0)
MCV: 95.5 fL (ref 80.0–100.0)
Monocytes Absolute: 0.7 10*3/uL (ref 0.1–1.0)
Monocytes Relative: 4 %
Neutro Abs: 14.8 10*3/uL — ABNORMAL HIGH (ref 1.7–7.7)
Neutrophils Relative %: 86 %
Platelets: 182 10*3/uL (ref 150–400)
RBC: 3.32 MIL/uL — ABNORMAL LOW (ref 4.22–5.81)
RDW: 13 % (ref 11.5–15.5)
WBC: 17.4 10*3/uL — ABNORMAL HIGH (ref 4.0–10.5)
nRBC: 0 % (ref 0.0–0.2)

## 2022-01-12 LAB — GLUCOSE, CAPILLARY
Glucose-Capillary: 105 mg/dL — ABNORMAL HIGH (ref 70–99)
Glucose-Capillary: 76 mg/dL (ref 70–99)
Glucose-Capillary: 129 mg/dL — ABNORMAL HIGH (ref 70–99)
Glucose-Capillary: 220 mg/dL — ABNORMAL HIGH (ref 70–99)
Glucose-Capillary: 132 mg/dL — ABNORMAL HIGH (ref 70–99)

## 2022-01-12 LAB — MAGNESIUM: Magnesium: 1.9 mg/dL (ref 1.7–2.4)

## 2022-01-12 LAB — PHOSPHORUS: Phosphorus: 2.3 mg/dL — ABNORMAL LOW (ref 2.5–4.6)

## 2022-01-12 MED ORDER — K PHOS MONO-SOD PHOS DI & MONO 155-852-130 MG PO TABS
500.0000 mg | ORAL_TABLET | Freq: Once | ORAL | Status: AC
  Administered 2022-01-12: 500 mg via ORAL
  Filled 2022-01-12: qty 2

## 2022-01-12 NOTE — Progress Notes (Signed)
PROGRESS NOTE    Jeff Willis  AOZ:[id number] DOB: December 02, 1940 DOA: 12/30/2021 PCP: Gaynelle Arabian, MD   Brief Narrative: 81 year old with past medical history significant for known esophageal stenosis status post dilation April 2023, IPF on Esbriet (FVC 84% and DLCO 45 % May 2023) patient flew to [place] for a cruise, on 12/24/2021 developed fever cough.  On 12/26/2021 on the cruise he was diagnosed with COVID-19 infection, declined Paxlovid at that time, due to previously experienced side effects from it.  Continue to have cough, failure to thrive, returned to [place] via flight 12/30/2021.  Pulse ox was checked and was found to be hypoxic presented via EMS.  COVID PCR positive.  CT chest with contrast negative for PE, CTA chest neg for PE. Admitted to the hospital 12/30/2021 with acute hypoxic respiratory failure secondary to COVID-19. On 9/5 was intubated due to respiratory failure and extubated on 9/8. TRH assumed care.    Assessment & Plan:   Principal Problem:   Acute respiratory failure with hypoxia (HCC) Active Problems:   Interstitial lung disease ([place])   COVID-19 virus infection   Hyponatremia   History of sinus tachycardia   Pneumonia due to COVID-19 virus   Protein-calorie malnutrition, severe   Pressure injury of skin  Acute Hypoxic respiratory failure IPF flare in the setting of COVID-19 and rhinovirus Possible aspiration pneumonia on 01/10/2022 Patient subsequently 9/5 developed worsening respiratory failure requiring intubation, extubated on 9/8 On 9/14 pt requiring about 20 L of high flow nasal cannula on 9/14, PCCM reconsulted CTA chest negative for PE Completed tapered dose of dexamethasone on 01/10/2022, start Solu-Medrol on 9/14 as per PCCM Completed 5 days antibiotics, started Unasyn on 01/10/2022 Continue with flutter valve, PRN Mucinex.  Patient needs to be on airborne precaution for covid for 21 days from 8/30 (Covid test perform at the cruise) . Off  isolation 9/21 High risk for re-intubation-transfer to ICU Patient accepted to [place] inpatient rehab prior to his deterioration, on hold for now  Abdominal distention/?Constipation Reported bowel movement Noted some distention, denies any significant pain Abdominal x-ray unremarkable, no bowel dilatation, no significant stool burden is noted Continue laxatives  Tachycardia/A-fib Hypertension Demand ischemia Continue with metoprolol PRN hydralazine.   Diabetes mellitus type 2 SSI, Accu-Cheks, hypoglycemic protocol  Known esophageal dilation April 2023 History of dysphagia for solid and spices PPI-- change to BID.  MBS: No evidence of aspiration,  A 13 mm pill  lodged briefly in the vallecular space, then above the UES.  It passed through the UES into the cervical esophagus, then returned to the hypopharynx.  A pureed bolus helped pass it through the esophagus without incident/  Patient to follow regular diet but to avoid large bolus of food, hard meat, should eat soft food.  He relates food has been going down fine. Plan to monitor for worsening symptoms.    Anemia of chronic disease Hb stable.   Severe protein caloric malnutrition due to acute illness Failure to thrive due to acute illness, weight loss from IPF -BSM: Diet advanced to regular, soft, avoid meat, bread or large amount of bolus.  -Nutritionist consulted.   Back pain: acute on chronic. Spondylosis.  Started low dose gabapentin 100 mg TID 9/10. Continue with current dose. Pain is getting better, unable to tolerate high dose. Continue with oxycodone PRN -MRI Lumbar Spine: 1. L5-S1 right paracentral disc extrusion with caudal migration, new from the prior exam, which effaces the right lateral recess and contacts the descending right S1  nerve roots. Edema and enhancement at the superior aspect of L4, which appears concentrated about the superior endplate, with edema in the intervertebral disc, and a smaller focus of  similar abnormal signal at the inferior endplate of L3. These findings are nonspecific and can be seen in the setting of degenerative changes/acute Schmorl's node formation, which can be painful, although early discitis-osteomyelitis can appear similar. L3-L4 mild spinal canal stenosis, unchanged. L4-L5 and L5-S1 mild bilateral neural foraminal narrowing, unchanged. Neurosurgery consulted, they think MRI finding consistent with degenerative spondylosis throughout spine. No need for acute intervention.  Blood culture: no growth to date/ Blood culture from admission negative. Recent ESR normal, CRP normal. Inflammatory marker initially elevate on admission likely related to covid.     Elevated D dimer; in setting of covid Doppler LE negative for DVT On Lovenox. Level decreasing.   See wound care documentation below.  Pressure Injury 01/05/22 Buttocks Right Stage 2 -  Partial thickness loss of dermis presenting as a shallow open injury with a red, pink wound bed without slough. (Active)  01/05/22 2130  Location: Buttocks  Location Orientation: Right  Staging: Stage 2 -  Partial thickness loss of dermis presenting as a shallow open injury with a red, pink wound bed without slough.  Wound Description (Comments):   Present on Admission:   Dressing Type Foam - Lift dressing to assess site every shift 01/12/22 0805     Nutrition Problem: Severe Malnutrition Etiology: chronic illness    Signs/Symptoms: severe muscle depletion, severe fat depletion    Interventions: Ensure Enlive (each supplement provides 350kcal and 20 grams of protein), MVI, Liberalize Diet  Estimated body mass index is 18.5 kg/m as calculated from the following:   Height as of this encounter: _0  (1.676 m).   Weight as of this encounter: 52 kg.   DVT prophylaxis: Lovenox Code Status: Full code Family Communication: Son at bedside Disposition Plan:  Status is: Inpatient Remains inpatient appropriate because:  management resp failure    Consultants:  CCM  Procedures:  ECHO;   Antimicrobials:    Subjective: Patient reports feeling better today, denies any new complaints.  Now requiring about 2L of  O2  Objective: Vitals:   01/12/22 1134 01/12/22 1200 01/12/22 1400 01/12/22 1500  BP:  124/76 124/77   Pulse:  71 73   Resp:  17 (!) 26   Temp: 97.9 F (36.6 C)   97.9 F (36.6 C)  TempSrc: Oral   Oral  SpO2:  100% 94%   Weight:      Height:        Intake/Output Summary (Last 24 hours) at 01/12/2022 1743 Last data filed at 01/12/2022 1500 Gross per 24 hour  Intake 1140 ml  Output 1150 ml  Net -10 ml   Filed Weights   01/10/22 0553 01/11/22 0500 01/12/22 0500  Weight: 57.7 kg 52.4 kg 52 kg    Examination: General: NAD  Cardiovascular: S1, S2 present Respiratory: Mild bibasilar crackles noted, diminished breath sounds Abdomen: Soft, nontender, nondistended, bowel sounds present Musculoskeletal: No bilateral pedal edema noted Skin: Normal Psychiatry: Normal mood    Data Reviewed: I have personally reviewed following labs and imaging studies  CBC: Recent Labs  Lab 01/08/22 0433 01/09/22 0420 01/10/22 0307 01/11/22 0532 01/12/22 0727  WBC 11.2* 10.8* 11.8* 22.1* 17.4*  NEUTROABS 9.1* 8.0* 9.5* 20.8* 14.8*  HGB 12.2* 11.8* 12.0* 11.8* 11.0*  HCT 34.3* 33.7* 33.9* 33.8* 31.7*  MCV 93.2 93.9 93.6 94.4 95.5  PLT  222 210 207 187 407   Basic Metabolic Panel: Recent Labs  Lab 01/08/22 0433 01/09/22 0420 01/10/22 0307 01/11/22 0532 01/12/22 0727  NA 136  --  134* 135 135  K 4.1  --  4.2 4.1 4.3  CL 102  --  102 101 99  CO2 31  --  _0 GLUCOSE 97  --  101* 121* 78  BUN 36*  --  30* 30* 28*  CREATININE 0.93  --  0.91 1.00 0.89  CALCIUM 8.8*  --  8.7* 8.5* 8.4*  MG 2.1 1.9 2.0 1.9 1.9  PHOS 3.5 3.1 3.3 4.5 2.3*   GFR: Estimated Creatinine Clearance: 48.7 mL/min (by C-G formula based on SCr of 0.89 mg/dL). Liver Function Tests: No results for  input(s): "AST", "ALT", "ALKPHOS", "BILITOT", "PROT", "ALBUMIN" in the last 168 hours.  No results for input(s): "LIPASE", "AMYLASE" in the last 168 hours. No results for input(s): "AMMONIA" in the last 168 hours. Coagulation Profile: No results for input(s): "INR", "PROTIME" in the last 168 hours.  Cardiac Enzymes: No results for input(s): "CKTOTAL", "CKMB", "CKMBINDEX", "TROPONINI" in the last 168 hours. BNP (last 3 results) Recent Labs    09/07/21 1218  PROBNP 211.0*   HbA1C: No results for input(s): "HGBA1C" in the last 72 hours. CBG: Recent Labs  Lab 01/11/22 2325 01/12/22 0340 01/12/22 0738 01/12/22 1131 01/12/22 1558  GLUCAP 155* 105* 76 129* 220*   Lipid Profile: No results for input(s): "CHOL", "HDL", "LDLCALC", "TRIG", "CHOLHDL", "LDLDIRECT" in the last 72 hours.  Thyroid Function Tests: No results for input(s): "TSH", "T4TOTAL", "FREET4", "T3FREE", "THYROIDAB" in the last 72 hours. Anemia Panel: No results for input(s): "VITAMINB12", "FOLATE", "FERRITIN", "TIBC", "IRON", "RETICCTPCT" in the last 72 hours.  Sepsis Labs: No results for input(s): "PROCALCITON", "LATICACIDVEN" in the last 168 hours.  Recent Results (from the past 240 hour(s))  Culture, blood (Routine X 2) w Reflex to ID Panel     Status: None   Collection Time: 01/07/22  9:08 AM   Specimen: BLOOD LEFT ARM  Result Value Ref Range Status   Specimen Description BLOOD LEFT ARM  Final   Special Requests   Final    BOTTLES DRAWN AEROBIC AND ANAEROBIC Blood Culture adequate volume   Culture   Final    NO GROWTH 5 DAYS Performed at [facility], [address] [place], [place] [address]    Report Status 01/12/2022 FINAL  Final  Culture, blood (Routine X 2) w Reflex to ID Panel     Status: None   Collection Time: 01/07/22  9:08 AM   Specimen: BLOOD LEFT HAND  Result Value Ref Range Status   Specimen Description BLOOD LEFT HAND  Final   Special Requests   Final    BOTTLES DRAWN AEROBIC AND  ANAEROBIC Blood Culture adequate volume   Culture   Final    NO GROWTH 5 DAYS Performed at [facility], [place] [address], [place], [place] [address]    Report Status 01/12/2022 FINAL  Final         Radiology Studies: No results found.      Scheduled Meds:  ascorbic acid  500 mg Oral Daily   aspirin  81 mg Oral Daily   Chlorhexidine Gluconate Cloth  6 each Topical Daily   enoxaparin (LOVENOX) injection  40 mg Subcutaneous Q24H   feeding supplement  237 mL Oral BID BM   gabapentin  100 mg Oral TID   insulin  aspart  0-9 Units Subcutaneous Q4H   insulin detemir  6 Units Subcutaneous Q12H   lidocaine  1 patch Transdermal Q24H   methylPREDNISolone (SOLU-MEDROL) injection  60 mg Intravenous Daily   metoprolol tartrate  12.5 mg Oral BID   multivitamin with minerals  1 tablet Oral Daily   pantoprazole  40 mg Oral BID   polyethylene glycol  17 g Oral BID   rosuvastatin  10 mg Oral QHS   senna  2 tablet Oral Daily   zinc sulfate  220 mg Oral Daily   Continuous Infusions:  sodium chloride Stopped (01/01/22 0609)   ampicillin-sulbactam (UNASYN) IV 3 g (01/12/22 1730)     LOS: 12 days       Alma Friendly, MD [facility]   If 7PM-7AM, please contact night-coverage [email or web address]  01/12/2022, 5:43 PM

## 2022-01-12 NOTE — Progress Notes (Signed)
NAME:  Jeff Willis, MRN:  [id number], DOB:  Sep 25, 1940, LOS: 12 ADMISSION DATE:  12/30/2021, CONSULTATION DATE:  12/31/21 REFERRING MD:  Dr. Jennette Kettle, CHIEF COMPLAINT:  IPF patient with acute Covid-19   History of Present Illness:  AYDEEN BLUME -has known to have esophageal stenosis status post dilatation April 2023 by Dr. Saralyn Pilar on.  IPF on esbreit (FVC 84% and DLCO 45% - may 2023) on esbreit. On room air. Noral RHC 11/22/21.  Follows with Dr Chase Caller. HRCT 821'@3'$  with worsening.  Flew to [place] for a cruise.  On 12/24/2021 developed fever and cough.  On 12/26/2021 and the cruciate was diagnosed with COVID 19 acute infection.  Was offered antiviral Paxlovid but he refused because of negative experience previously with side effects.  He continued to have fever cough and failure to thrive, low appetite and being bedbound.  He returned to [place] via flight on 12/30/2021 and at home was continuing to cough.  Pulse ox check at home showed hypoxemia and therefore was brought to the emergency department via EMS.  Here he is resting on 3 L nasal cannula with stable work of breathing.  COVID PCR is positive.  CT chest with contrast [none PE protocol but no big PE noted.  He has left greater than right diffuse groundglass opacities.  Pertinent  Medical History  IPF on esbriet, UIP, esophageal stenosis s/p dilatation 2023 Back pain, Elevated PSA, Essential tremor, Hypercholesteremia, Neuropathy, Prostate cancer ([place]), and Sinus tachycardia (01/30/2018).   Reports that he has never smoked. He has never used smokeless tobacco.   Has a past surgical history that includes Lumbar microdiscectomy; Radioactive seed implant (N/A, 02/24/2018); SPACE OAR INSTILLATION (N/A, 02/24/2018); Esophagogastroduodenoscopy (egd) with propofol (N/A, 08/10/2021); Colonoscopy with propofol (N/A, 08/10/2021); Esophageal dilation (08/10/2021); polypectomy (08/10/2021); Hemostasis clip placement (08/10/2021); and RIGHT  HEART CATH (N/A, 11/22/2021).  Significant Hospital Events: Including procedures, antibiotic start and stop dates in addition to other pertinent events   12/30/2021 - admitted 9/4- BiPAP, transferred to ICU 9/5- Intubated 0518 d/t respiratory failure, A line placed, levophed [id number] 9/8-extubated 9/11-transferred to [facility] 9/12-CTA chest negative for acute PE, KUB neg 9/13-Possible aspiration event with hypoxemia noted by family 9/14-CXR with some worsening of RML, worsening hypoxia requiring high flow nasal cannula to 20 L 9/15 FiO2 weaned to 70%, still 20 L HFNC  Interim History / Subjective:  No issues overnight, was weaned to FiO2 of 70%/20 L HFNC. Still no BM and would like relief.    Objective   Blood pressure 124/76, pulse 71, temperature 97.9 F (36.6 C), temperature source Oral, resp. rate 17, height '5\' 6"'$  (1.676 m), weight 52 kg, SpO2 100 %.        Intake/Output Summary (Last 24 hours) at 01/12/2022 1232 Last data filed at 01/12/2022 1000 Gross per 24 hour  Intake 900 ml  Output 1050 ml  Net -150 ml  UOP 950 (0.6) Filed Weights   01/10/22 0553 01/11/22 0500 01/12/22 0500  Weight: 57.7 kg 52.4 kg 52 kg   Examination: General: NAD, laying in bed comfortable HENT: NCAT, nasal canula in place, MMM Lungs: diminished in upper lung fields, bibasilar inspiratory crackles present, no retractions or iWOB Cardiovascular: RRR no m/r/g Abdomen: Mildly distended, nontender to palpation Extremities: No LE edema, warm, well perfused Neuro: alert and responsive to all questions  Bcx no growth 4 days Na 135 from 134  K 4.1  Cr 1.00 Phos 4.5 Mag 1.9 WBC 22.1 from 11.8 Hgb 11.8 from 12  Resolved Hospital Problem list   oliguria Hypotension Bradycardia Hyperkalemia Mild to moderate hyponatremia  Assessment & Plan:  Acute IPF flare in setting of Covid-19 and Rhinovirus with aspiration event - Wean O2 for goal SpO2 92% and above - weaned to 2L during exam will likely need  increased O2 with exertion/ambulation - continue solumedrol '60mg'$  daily, will transition to oral prednisone taper - Complete course of unasyn for 5 days  Known esophageal dilatation April 2023 History of dysphagia for solids and spices 01/01/22 - possible aspiration event  -speech evaluation completed 9/15, oral diet resumed -PPI BID  Constipation - BM this morning -senna -miralax   DM -sSSI -6 u BID levemir  Elevated d dimer likely secondary to COVID CTA on 9/12 no acute PE, monitor -No PE noted on CT with contrast on admission -LE duplex is negative  Elevated Troponin -Likely demand. Echo without wall motion abnormalities  Atrial tachycardia/A fib. episode   In normal sinus rhythm now, rates controlled -lopressor -telemetry -Keep K greater than 4 and mag greater than 2  Anemia of chronic disease - Present on Admit Stable, no active bleeding/hemoptysis -monitor while on lovenox   Mild protein calorie malnutrition due to acute illness - Present on Admit -oral diet  Best Practice (right click and "Reselect all SmartList Selections" daily)   Per primary  Labs   CBC: Recent Labs  Lab 01/08/22 0433 01/09/22 0420 01/10/22 0307 01/11/22 0532 01/12/22 0727  WBC 11.2* 10.8* 11.8* 22.1* 17.4*  NEUTROABS 9.1* 8.0* 9.5* 20.8* 14.8*  HGB 12.2* 11.8* 12.0* 11.8* 11.0*  HCT 34.3* 33.7* 33.9* 33.8* 31.7*  MCV 93.2 93.9 93.6 94.4 95.5  PLT 222 210 207 187 222    Basic Metabolic Panel: Recent Labs  Lab 01/08/22 0433 01/09/22 0420 01/10/22 0307 01/11/22 0532 01/12/22 0727  NA 136  --  134* 135 135  K 4.1  --  4.2 4.1 4.3  CL 102  --  102 101 99  CO2 31  --  '26 27 26  '$ GLUCOSE 97  --  101* 121* 78  BUN 36*  --  30* 30* 28*  CREATININE 0.93  --  0.91 1.00 0.89  CALCIUM 8.8*  --  8.7* 8.5* 8.4*  MG 2.1 1.9 2.0 1.9 1.9  PHOS 3.5 3.1 3.3 4.5 2.3*   GFR: Estimated Creatinine Clearance: 48.7 mL/min (by C-G formula based on SCr of 0.89 mg/dL). Recent Labs  Lab  01/09/22 0420 01/10/22 0307 01/11/22 0532 01/12/22 0727  WBC 10.8* 11.8* 22.1* 17.4*    Liver Function Tests: No results for input(s): "AST", "ALT", "ALKPHOS", "BILITOT", "PROT", "ALBUMIN" in the last 168 hours. No results for input(s): "LIPASE", "AMYLASE" in the last 168 hours. No results for input(s): "AMMONIA" in the last 168 hours.  ABG    Component Value Date/Time   PHART 7.428 01/03/2022 1525   PCO2ART 49.0 (H) 01/03/2022 1525   PO2ART 136 (H) 01/03/2022 1525   HCO3 32.5 (H) 01/03/2022 1525   TCO2 34 (H) 01/03/2022 1525   O2SAT 99 01/03/2022 1525     Coagulation Profile: No results for input(s): "INR", "PROTIME" in the last 168 hours.   Cardiac Enzymes: No results for input(s): "CKTOTAL", "CKMB", "CKMBINDEX", "TROPONINI" in the last 168 hours.  HbA1C: Hgb A1c MFr Bld  Date/Time Value Ref Range Status  01/01/2022 05:30 AM 5.8 (H) 4.8 - 5.6 % Final    Comment:    (NOTE) Pre diabetes:          5.7%-6.4%  Diabetes:              >6.4%  Glycemic control for   <7.0% adults with diabetes     CBG: Recent Labs  Lab 01/11/22 1949 01/11/22 2325 01/12/22 0340 01/12/22 0738 01/12/22 1131  GLUCAP 215* 155* 105* 76 129*    Critical care time: n/a    Freda Jackson, MD [facility] Office: [phone]   See Amion for personal pager PCCM on call pager [phone] until 7pm. Please call Elink 7p-7a. [phone]

## 2022-01-13 DIAGNOSIS — E43 Unspecified severe protein-calorie malnutrition: Secondary | ICD-10-CM | POA: Diagnosis not present

## 2022-01-13 DIAGNOSIS — J9601 Acute respiratory failure with hypoxia: Secondary | ICD-10-CM | POA: Diagnosis not present

## 2022-01-13 DIAGNOSIS — U071 COVID-19: Secondary | ICD-10-CM | POA: Diagnosis not present

## 2022-01-13 DIAGNOSIS — J849 Interstitial pulmonary disease, unspecified: Secondary | ICD-10-CM | POA: Diagnosis not present

## 2022-01-13 LAB — BASIC METABOLIC PANEL
Anion gap: 4 — ABNORMAL LOW (ref 5–15)
BUN: 23 mg/dL (ref 8–23)
CO2: 31 mmol/L (ref 22–32)
Calcium: 8.5 mg/dL — ABNORMAL LOW (ref 8.9–10.3)
Chloride: 103 mmol/L (ref 98–111)
Creatinine, Ser: 0.95 mg/dL (ref 0.61–1.24)
GFR, Estimated: >60 mL/min (ref >60)
Glucose, Bld: 81 mg/dL (ref 70–99)
Potassium: 4.1 mmol/L (ref 3.5–5.1)
Sodium: 138 mmol/L (ref 135–145)

## 2022-01-13 LAB — CBC WITH DIFFERENTIAL/PLATELET
Abs Immature Granulocytes: 0.08 10*3/uL — ABNORMAL HIGH (ref 0.00–0.07)
Basophils Absolute: 0 10*3/uL (ref 0.0–0.1)
Basophils Relative: 0 %
Eosinophils Absolute: 0 10*3/uL (ref 0.0–0.5)
Eosinophils Relative: 0 %
HCT: 31.9 % — ABNORMAL LOW (ref 39.0–52.0)
Hemoglobin: 11 g/dL — ABNORMAL LOW (ref 13.0–17.0)
Immature Granulocytes: 1 %
Lymphocytes Relative: 9 %
Lymphs Abs: 1.2 10*3/uL (ref 0.7–4.0)
MCH: 33 pg (ref 26.0–34.0)
MCHC: 34.5 g/dL (ref 30.0–36.0)
MCV: 95.8 fL (ref 80.0–100.0)
Monocytes Absolute: 0.8 10*3/uL (ref 0.1–1.0)
Monocytes Relative: 6 %
Neutro Abs: 11.8 10*3/uL — ABNORMAL HIGH (ref 1.7–7.7)
Neutrophils Relative %: 84 %
Platelets: 186 10*3/uL (ref 150–400)
RBC: 3.33 MIL/uL — ABNORMAL LOW (ref 4.22–5.81)
RDW: 12.8 % (ref 11.5–15.5)
WBC: 13.9 10*3/uL — ABNORMAL HIGH (ref 4.0–10.5)
nRBC: 0 % (ref 0.0–0.2)

## 2022-01-13 LAB — GLUCOSE, CAPILLARY
Glucose-Capillary: 129 mg/dL — ABNORMAL HIGH (ref 70–99)
Glucose-Capillary: 164 mg/dL — ABNORMAL HIGH (ref 70–99)
Glucose-Capillary: 212 mg/dL — ABNORMAL HIGH (ref 70–99)
Glucose-Capillary: 285 mg/dL — ABNORMAL HIGH (ref 70–99)
Glucose-Capillary: 70 mg/dL (ref 70–99)
Glucose-Capillary: 75 mg/dL (ref 70–99)

## 2022-01-13 LAB — PHOSPHORUS: Phosphorus: 2.7 mg/dL (ref 2.5–4.6)

## 2022-01-13 LAB — MAGNESIUM: Magnesium: 2.1 mg/dL (ref 1.7–2.4)

## 2022-01-13 MED ORDER — INSULIN ASPART 100 UNIT/ML IJ SOLN
0.0000 [IU] | Freq: Three times a day (TID) | INTRAMUSCULAR | Status: DC
  Administered 2022-01-13: 3 [IU] via SUBCUTANEOUS
  Administered 2022-01-14 – 2022-01-15 (×3): 2 [IU] via SUBCUTANEOUS
  Administered 2022-01-16: 3 [IU] via SUBCUTANEOUS
  Administered 2022-01-17: 2 [IU] via SUBCUTANEOUS

## 2022-01-13 NOTE — Progress Notes (Signed)
NAME:  Jeff Willis, MRN:  [id number], DOB:  1940/07/24, LOS: 74 ADMISSION DATE:  12/30/2021, CONSULTATION DATE:  12/31/21 REFERRING MD:  Dr. Jennette Kettle, CHIEF COMPLAINT:  IPF patient with acute Covid-19   History of Present Illness:  DIANNE BADY -has known to have esophageal stenosis status post dilatation April 2023 by Dr. Saralyn Pilar on.  IPF on esbreit (FVC 84% and DLCO 45% - may 2023) on esbreit. On room air. Noral RHC 11/22/21.  Follows with Dr Chase Caller. HRCT 821'@3'$  with worsening.  Flew to [place] for a cruise.  On 12/24/2021 developed fever and cough.  On 12/26/2021 and the cruciate was diagnosed with COVID 19 acute infection.  Was offered antiviral Paxlovid but he refused because of negative experience previously with side effects.  He continued to have fever cough and failure to thrive, low appetite and being bedbound.  He returned to [place] via flight on 12/30/2021 and at home was continuing to cough.  Pulse ox check at home showed hypoxemia and therefore was brought to the emergency department via EMS.  Here he is resting on 3 L nasal cannula with stable work of breathing.  COVID PCR is positive.  CT chest with contrast [none PE protocol but no big PE noted.  He has left greater than right diffuse groundglass opacities.  Pertinent  Medical History  IPF on esbriet, UIP, esophageal stenosis s/p dilatation 2023 Back pain, Elevated PSA, Essential tremor, Hypercholesteremia, Neuropathy, Prostate cancer ([place]), and Sinus tachycardia (01/30/2018).   Reports that he has never smoked. He has never used smokeless tobacco.   Has a past surgical history that includes Lumbar microdiscectomy; Radioactive seed implant (N/A, 02/24/2018); SPACE OAR INSTILLATION (N/A, 02/24/2018); Esophagogastroduodenoscopy (egd) with propofol (N/A, 08/10/2021); Colonoscopy with propofol (N/A, 08/10/2021); Esophageal dilation (08/10/2021); polypectomy (08/10/2021); Hemostasis clip placement (08/10/2021); and RIGHT  HEART CATH (N/A, 11/22/2021).  Significant Hospital Events: Including procedures, antibiotic start and stop dates in addition to other pertinent events   12/30/2021 - admitted 9/4- BiPAP, transferred to ICU 9/5- Intubated 0518 d/t respiratory failure, A line placed, levophed [id number] 9/8-extubated 9/11-transferred to [facility] 9/12-CTA chest negative for acute PE, KUB neg 9/13-Possible aspiration event with hypoxemia noted by family 9/14-CXR with some worsening of RML, worsening hypoxia requiring high flow nasal cannula to 20 L 9/15 FiO2 weaned to 70%, still 20 L HFNC  Interim History / Subjective:  No acute events overnight Family updated at bedside He is on 2.5L O2     Objective   Blood pressure 134/72, pulse 73, temperature 97.7 F (36.5 C), temperature source Oral, resp. rate (!) 24, height '5\' 6"'$  (1.676 m), weight 55.9 kg, SpO2 98 %.        Intake/Output Summary (Last 24 hours) at 01/13/2022 0928 Last data filed at 01/13/2022 0800 Gross per 24 hour  Intake 799.97 ml  Output 1350 ml  Net -550.03 ml  UOP 950 (0.6) Filed Weights   01/11/22 0500 01/12/22 0500 01/13/22 0500  Weight: 52.4 kg 52 kg 55.9 kg   Examination: General: NAD, sitting up in bed HENT: NCAT, nasal canula in place, MMM Lungs: bibasilar crackles Cardiovascular: RRR no m/r/g Abdomen: Mildly distended, nontender to palpation Extremities: No LE edema, warm, warm Neuro: alert and responsive to all questions  Resolved Hospital Problem list   oliguria Hypotension Bradycardia Hyperkalemia Mild to moderate hyponatremia  Assessment & Plan:  Acute IPF flare in setting of Covid-19 and Rhinovirus with aspiration event - Wean O2 for goal SpO2 92% and above - continue  solumedrol '60mg'$  daily, will transition to oral prednisone taper tomorrow - Plan to start bactrim prophylaxis 3 days per week tomorrow - Plan to resume esbriet treatment for IPF - Complete course of unasyn for 5 days - Will need home O2 orders with  humidification and portable oxygen concentrator order.  Known esophageal dilatation April 2023 History of dysphagia for solids and spices 01/01/22 - possible aspiration event  -speech evaluation completed 9/15, oral diet resumed -PPI BID  Constipation - BM this morning -senna -miralax   DM -sSSI -6 u BID levemir  Elevated d dimer likely secondary to COVID CTA on 9/12 no acute PE, monitor -No PE noted on CT with contrast on admission -LE duplex is negative  Elevated Troponin -Likely demand. Echo without wall motion abnormalities  Atrial tachycardia/A fib. episode   In normal sinus rhythm now, rates controlled -lopressor -telemetry -Keep K greater than 4 and mag greater than 2  Anemia of chronic disease - Present on Admit Stable, no active bleeding/hemoptysis -monitor while on lovenox   Mild protein calorie malnutrition due to acute illness - Present on Admit -oral diet  Physical Deconditioning - inpatient rehab consult placed - discussed with family about going home with oxygen and home PT. Will determine based on further PT evaluations  Best Practice (right click and "Reselect all SmartList Selections" daily)   Per primary  Labs   CBC: Recent Labs  Lab 01/09/22 0420 01/10/22 0307 01/11/22 0532 01/12/22 0727 01/13/22 0412  WBC 10.8* 11.8* 22.1* 17.4* 13.9*  NEUTROABS 8.0* 9.5* 20.8* 14.8* 11.8*  HGB 11.8* 12.0* 11.8* 11.0* 11.0*  HCT 33.7* 33.9* 33.8* 31.7* 31.9*  MCV 93.9 93.6 94.4 95.5 95.8  PLT 210 207 187 182 160    Basic Metabolic Panel: Recent Labs  Lab 01/08/22 0433 01/09/22 0420 01/10/22 0307 01/11/22 0532 01/12/22 0727 01/13/22 0412  NA 136  --  134* 135 135 138  K 4.1  --  4.2 4.1 4.3 4.1  CL 102  --  102 101 99 103  CO2 31  --  '26 27 26 31  '$ GLUCOSE 97  --  101* 121* 78 81  BUN 36*  --  30* 30* 28* 23  CREATININE 0.93  --  0.91 1.00 0.89 0.95  CALCIUM 8.8*  --  8.7* 8.5* 8.4* 8.5*  MG 2.1 1.9 2.0 1.9 1.9 2.1  PHOS 3.5 3.1 3.3 4.5  2.3* 2.7   GFR: Estimated Creatinine Clearance: 49 mL/min (by C-G formula based on SCr of 0.95 mg/dL). Recent Labs  Lab 01/10/22 0307 01/11/22 0532 01/12/22 0727 01/13/22 0412  WBC 11.8* 22.1* 17.4* 13.9*    Liver Function Tests: No results for input(s): "AST", "ALT", "ALKPHOS", "BILITOT", "PROT", "ALBUMIN" in the last 168 hours. No results for input(s): "LIPASE", "AMYLASE" in the last 168 hours. No results for input(s): "AMMONIA" in the last 168 hours.  ABG    Component Value Date/Time   PHART 7.428 01/03/2022 1525   PCO2ART 49.0 (H) 01/03/2022 1525   PO2ART 136 (H) 01/03/2022 1525   HCO3 32.5 (H) 01/03/2022 1525   TCO2 34 (H) 01/03/2022 1525   O2SAT 99 01/03/2022 1525     Coagulation Profile: No results for input(s): "INR", "PROTIME" in the last 168 hours.   Cardiac Enzymes: No results for input(s): "CKTOTAL", "CKMB", "CKMBINDEX", "TROPONINI" in the last 168 hours.  HbA1C: Hgb A1c MFr Bld  Date/Time Value Ref Range Status  01/01/2022 05:30 AM 5.8 (H) 4.8 - 5.6 % Final    Comment:    (  NOTE) Pre diabetes:          5.7%-6.4%  Diabetes:              >6.4%  Glycemic control for   <7.0% adults with diabetes     CBG: Recent Labs  Lab 01/12/22 1558 01/12/22 1950 01/13/22 0001 01/13/22 0457 01/13/22 0828  GLUCAP 220* 132* 164* 70 75    Critical care time: n/a    Freda Jackson, MD [facility] Office: [phone]   See Amion for personal pager PCCM on call pager [phone] until 7pm. Please call Elink 7p-7a. [phone]

## 2022-01-13 NOTE — Progress Notes (Signed)
PROGRESS NOTE    Jeff Willis  YOK:[id number] DOB: Sep 03, 1940 DOA: 12/30/2021 PCP: Gaynelle Arabian, MD   Brief Narrative: 81 year old with past medical history significant for known esophageal stenosis status post dilation April 2023, IPF on Esbriet (FVC 84% and DLCO 45 % May 2023) patient flew to [place] for a cruise, on 12/24/2021 developed fever cough.  On 12/26/2021 on the cruise he was diagnosed with COVID-19 infection, declined Paxlovid at that time, due to previously experienced side effects from it.  Continue to have cough, failure to thrive, returned to [place] via flight 12/30/2021.  Pulse ox was checked and was found to be hypoxic presented via EMS.  COVID PCR positive.  CT chest with contrast negative for PE, CTA chest neg for PE. Admitted to the hospital 12/30/2021 with acute hypoxic respiratory failure secondary to COVID-19. On 9/5 was intubated due to respiratory failure and extubated on 9/8. TRH assumed care.    Assessment & Plan:   Principal Problem:   Acute respiratory failure with hypoxia (HCC) Active Problems:   Interstitial lung disease ([place])   COVID-19 virus infection   Hyponatremia   History of sinus tachycardia   Pneumonia due to COVID-19 virus   Protein-calorie malnutrition, severe   Pressure injury of skin  Acute Hypoxic respiratory failure IPF flare in the setting of COVID-19 and rhinovirus Possible aspiration pneumonia on 01/10/2022 Now on 4 L of O2 Patient subsequently 9/5 developed worsening respiratory failure requiring intubation, extubated on 9/8 On 9/14 pt requiring about 20 L of high flow nasal cannula on 9/14, PCCM reconsulted, appreciate recs CTA chest negative for PE Completed tapered dose of dexamethasone on 01/10/2022, start Solu-Medrol on 9/14 as per PCCM Completed 5 days antibiotics, started Unasyn on 01/10/2022 as per PCCM Continue with flutter valve, PRN Mucinex.  Patient needs to be on airborne precaution for covid for 21 days from  8/30 (Covid test perform at the cruise) . Off isolation 9/21 Patient accepted to [facility] inpatient rehab prior to his deterioration, on hold for now  Abdominal distention/?Constipation Reported multiple bowel movement Noted some distention, denies any significant pain Abdominal x-ray unremarkable, no bowel dilatation, no significant stool burden is noted Continue laxatives  Tachycardia/A-fib Hypertension Demand ischemia Continue with metoprolol PRN hydralazine.   Diabetes mellitus type 2 SSI, Accu-Cheks, hypoglycemic protocol  Known esophageal dilation April 2023 History of dysphagia for solid and spices PPI-- change to BID.  MBS: No evidence of aspiration,  A 13 mm pill  lodged briefly in the vallecular space, then above the UES.  It passed through the UES into the cervical esophagus, then returned to the hypopharynx.  A pureed bolus helped pass it through the esophagus without incident/  Patient to follow regular diet but to avoid large bolus of food, hard meat, should eat soft food  Anemia of chronic disease Hb stable.   Severe protein caloric malnutrition due to acute illness Failure to thrive due to acute illness, weight loss from IPF -BSM: Diet advanced to regular, soft, avoid meat, bread or large amount of bolus.  -Nutritionist consulted.   Back pain: acute on chronic. Spondylosis.  Started low dose gabapentin 100 mg TID 9/10. Continue with current dose. Pain is getting better, unable to tolerate high dose. Continue with oxycodone PRN -MRI Lumbar Spine: 1. L5-S1 right paracentral disc extrusion with caudal migration, new from the prior exam, which effaces the right lateral recess and contacts the descending right S1 nerve roots. Edema and enhancement at the superior aspect of L4,  which appears concentrated about the superior endplate, with edema in the intervertebral disc, and a smaller focus of similar abnormal signal at the inferior endplate of L3. These findings are  nonspecific and can be seen in the setting of degenerative changes/acute Schmorl's node formation, which can be painful, although early discitis-osteomyelitis can appear similar. L3-L4 mild spinal canal stenosis, unchanged. L4-L5 and L5-S1 mild bilateral neural foraminal narrowing, unchanged. Neurosurgery consulted, they think MRI finding consistent with degenerative spondylosis throughout spine. No need for acute intervention.  Blood culture: no growth to date/ Blood culture from admission negative. Recent ESR normal, CRP normal. Inflammatory marker initially elevate on admission likely related to covid.   Elevated D dimer; in setting of covid Doppler LE negative for DVT On Lovenox. Level decreasing.   See wound care documentation below.  Pressure Injury 01/05/22 Buttocks Right Stage 2 -  Partial thickness loss of dermis presenting as a shallow open injury with a red, pink wound bed without slough. (Active)  01/05/22 2130  Location: Buttocks  Location Orientation: Right  Staging: Stage 2 -  Partial thickness loss of dermis presenting as a shallow open injury with a red, pink wound bed without slough.  Wound Description (Comments):   Present on Admission:   Dressing Type Foam - Lift dressing to assess site every shift 01/13/22 0800     Nutrition Problem: Severe Malnutrition Etiology: chronic illness    Signs/Symptoms: severe muscle depletion, severe fat depletion    Interventions: Ensure Enlive (each supplement provides 350kcal and 20 grams of protein), MVI, Liberalize Diet  Estimated body mass index is 19.89 kg/m as calculated from the following:   Height as of this encounter: _0  (1.676 m).   Weight as of this encounter: 55.9 kg.   DVT prophylaxis: Lovenox Code Status: Full code Family Communication: Son at bedside Disposition Plan:  Status is: Inpatient Remains inpatient appropriate because: management resp failure    Consultants:  CCM  Procedures:  Mechanical  ventilation  Antimicrobials:  Unasyn    Subjective: Patient denies any new complaints.  Discussed with son at bedside    Objective: Vitals:   01/13/22 0800 01/13/22 1100 01/13/22 1200 01/13/22 1558  BP: 134/72  128/68 (!) 145/84  Pulse: 73  73 86  Resp: (!) _1 Temp: 97.7 F (36.5 C) 98.3 F (36.8 C)    TempSrc: Oral Oral    SpO2: 98%  98%   Weight:      Height:        Intake/Output Summary (Last 24 hours) at 01/13/2022 1736 Last data filed at 01/13/2022 1500 Gross per 24 hour  Intake 879.84 ml  Output 1400 ml  Net -520.16 ml   Filed Weights   01/11/22 0500 01/12/22 0500 01/13/22 0500  Weight: 52.4 kg 52 kg 55.9 kg    Examination: General: NAD  Cardiovascular: S1, S2 present Respiratory: Mild bibasilar crackles noted, diminished breath sounds Abdomen: Soft, nontender, nondistended, bowel sounds present Musculoskeletal: No bilateral pedal edema noted Skin: Normal Psychiatry: Normal mood    Data Reviewed: I have personally reviewed following labs and imaging studies  CBC: Recent Labs  Lab 01/09/22 0420 01/10/22 0307 01/11/22 0532 01/12/22 0727 01/13/22 0412  WBC 10.8* 11.8* 22.1* 17.4* 13.9*  NEUTROABS 8.0* 9.5* 20.8* 14.8* 11.8*  HGB 11.8* 12.0* 11.8* 11.0* 11.0*  HCT 33.7* 33.9* 33.8* 31.7* 31.9*  MCV 93.9 93.6 94.4 95.5 95.8  PLT 210 207 187 182 287   Basic Metabolic Panel: Recent Labs  Lab  01/08/22 0433 01/09/22 0420 01/10/22 0307 01/11/22 0532 01/12/22 0727 01/13/22 0412  NA 136  --  134* 135 135 138  K 4.1  --  4.2 4.1 4.3 4.1  CL 102  --  102 101 99 103  CO2 31  --  _0 GLUCOSE 97  --  101* 121* 78 81  BUN 36*  --  30* 30* 28* 23  CREATININE 0.93  --  0.91 1.00 0.89 0.95  CALCIUM 8.8*  --  8.7* 8.5* 8.4* 8.5*  MG 2.1 1.9 2.0 1.9 1.9 2.1  PHOS 3.5 3.1 3.3 4.5 2.3* 2.7   GFR: Estimated Creatinine Clearance: 49 mL/min (by C-G formula based on SCr of 0.95 mg/dL). Liver Function Tests: No results for input(s):  "AST", "ALT", "ALKPHOS", "BILITOT", "PROT", "ALBUMIN" in the last 168 hours.  No results for input(s): "LIPASE", "AMYLASE" in the last 168 hours. No results for input(s): "AMMONIA" in the last 168 hours. Coagulation Profile: No results for input(s): "INR", "PROTIME" in the last 168 hours.  Cardiac Enzymes: No results for input(s): "CKTOTAL", "CKMB", "CKMBINDEX", "TROPONINI" in the last 168 hours. BNP (last 3 results) Recent Labs    09/07/21 1218  PROBNP 211.0*   HbA1C: No results for input(s): "HGBA1C" in the last 72 hours. CBG: Recent Labs  Lab 01/13/22 0001 01/13/22 0457 01/13/22 0828 01/13/22 1135 01/13/22 1553  GLUCAP 164* 70 75 129* 285*   Lipid Profile: No results for input(s): "CHOL", "HDL", "LDLCALC", "TRIG", "CHOLHDL", "LDLDIRECT" in the last 72 hours.  Thyroid Function Tests: No results for input(s): "TSH", "T4TOTAL", "FREET4", "T3FREE", "THYROIDAB" in the last 72 hours. Anemia Panel: No results for input(s): "VITAMINB12", "FOLATE", "FERRITIN", "TIBC", "IRON", "RETICCTPCT" in the last 72 hours.  Sepsis Labs: No results for input(s): "PROCALCITON", "LATICACIDVEN" in the last 168 hours.  Recent Results (from the past 240 hour(s))  Culture, blood (Routine X 2) w Reflex to ID Panel     Status: None   Collection Time: 01/07/22  9:08 AM   Specimen: BLOOD LEFT ARM  Result Value Ref Range Status   Specimen Description BLOOD LEFT ARM  Final   Special Requests   Final    BOTTLES DRAWN AEROBIC AND ANAEROBIC Blood Culture adequate volume   Culture   Final    NO GROWTH 5 DAYS Performed at [facility], [address] [place], [place] [address]    Report Status 01/12/2022 FINAL  Final  Culture, blood (Routine X 2) w Reflex to ID Panel     Status: None   Collection Time: 01/07/22  9:08 AM   Specimen: BLOOD LEFT HAND  Result Value Ref Range Status   Specimen Description BLOOD LEFT HAND  Final   Special Requests   Final    BOTTLES DRAWN AEROBIC AND ANAEROBIC  Blood Culture adequate volume   Culture   Final    NO GROWTH 5 DAYS Performed at [facility], [place] [address], [place], [place] [address]    Report Status 01/12/2022 FINAL  Final         Radiology Studies: No results found.      Scheduled Meds:  aspirin  81 mg Oral Daily   enoxaparin (LOVENOX) injection  40 mg Subcutaneous Q24H   feeding supplement  237 mL Oral BID BM   gabapentin  100 mg Oral TID   insulin aspart  0-6 Units Subcutaneous TID WC   insulin detemir  6 Units Subcutaneous Q12H   lidocaine  1  patch Transdermal Q24H   methylPREDNISolone (SOLU-MEDROL) injection  60 mg Intravenous Daily   metoprolol tartrate  12.5 mg Oral BID   multivitamin with minerals  1 tablet Oral Daily   pantoprazole  40 mg Oral BID   polyethylene glycol  17 g Oral BID   rosuvastatin  10 mg Oral QHS   senna  2 tablet Oral Daily   Continuous Infusions:  sodium chloride Stopped (01/01/22 0609)   ampicillin-sulbactam (UNASYN) IV 3 g (01/13/22 1710)     LOS: 13 days       Alma Friendly, MD [facility]   If 7PM-7AM, please contact night-coverage [email or web address]  01/13/2022, 5:36 PM

## 2022-01-13 NOTE — TOC Progression Note (Signed)
Transition of Care [facility]) - Progression Note    Patient Details  Name: LARRON ARMOR MRN: [id number] Date of Birth: 06-09-1940  Transition of Care [facility]) CM/SW Contact  Bartholomew Crews, RN Phone Number: [phone] 01/13/2022, 4:01 PM  Clinical Narrative:     Received message from Anderson Malta, liaison at [facility]. Patient had received authorization to go to [facility] prior to recent set back. Current authorization expires today. Patient not medical ready. [facility] to seek authorization again when patient medically ready.    Expected Discharge Plan: IP Rehab Facility Barriers to Discharge: Continued Medical Work up  Expected Discharge Plan and Services Expected Discharge Plan: [place] In-house Referral: NA Discharge Planning Services: CM Consult Post Acute Care Choice: IP Rehab Living arrangements for the past 2 months: Single Family Home                   DME Agency: NA                   Social Determinants of Health (SDOH) Interventions    Readmission Risk Interventions    01/07/2022    4:36 PM  Readmission Risk Prevention Plan  Transportation Screening Complete  PCP or Specialist Appt within 3-5 Days Complete  HRI or [place] Complete  Social Work Consult for [place] Planning/Counseling Complete  Palliative Care Screening Not Applicable  Medication Review Press photographer) Referral to Pharmacy

## 2022-01-14 DIAGNOSIS — E43 Unspecified severe protein-calorie malnutrition: Secondary | ICD-10-CM | POA: Diagnosis not present

## 2022-01-14 DIAGNOSIS — U071 COVID-19: Secondary | ICD-10-CM | POA: Diagnosis not present

## 2022-01-14 DIAGNOSIS — J849 Interstitial pulmonary disease, unspecified: Secondary | ICD-10-CM | POA: Diagnosis not present

## 2022-01-14 DIAGNOSIS — J9601 Acute respiratory failure with hypoxia: Secondary | ICD-10-CM | POA: Diagnosis not present

## 2022-01-14 LAB — BASIC METABOLIC PANEL WITH GFR
Anion gap: 6 (ref 5–15)
BUN: 20 mg/dL (ref 8–23)
CO2: 29 mmol/L (ref 22–32)
Calcium: 8.3 mg/dL — ABNORMAL LOW (ref 8.9–10.3)
Chloride: 100 mmol/L (ref 98–111)
Creatinine, Ser: 0.87 mg/dL (ref 0.61–1.24)
GFR, Estimated: >60 mL/min
Glucose, Bld: 105 mg/dL — ABNORMAL HIGH (ref 70–99)
Potassium: 3.6 mmol/L (ref 3.5–5.1)
Sodium: 135 mmol/L (ref 135–145)

## 2022-01-14 LAB — CBC WITH DIFFERENTIAL/PLATELET
Abs Immature Granulocytes: 0.08 K/uL — ABNORMAL HIGH (ref 0.00–0.07)
Basophils Absolute: 0 K/uL (ref 0.0–0.1)
Basophils Relative: 0 %
Eosinophils Absolute: 0.1 K/uL (ref 0.0–0.5)
Eosinophils Relative: 0 %
HCT: 31.6 % — ABNORMAL LOW (ref 39.0–52.0)
Hemoglobin: 10.6 g/dL — ABNORMAL LOW (ref 13.0–17.0)
Immature Granulocytes: 1 %
Lymphocytes Relative: 9 %
Lymphs Abs: 1 K/uL (ref 0.7–4.0)
MCH: 32.1 pg (ref 26.0–34.0)
MCHC: 33.5 g/dL (ref 30.0–36.0)
MCV: 95.8 fL (ref 80.0–100.0)
Monocytes Absolute: 0.8 K/uL (ref 0.1–1.0)
Monocytes Relative: 7 %
Neutro Abs: 10 K/uL — ABNORMAL HIGH (ref 1.7–7.7)
Neutrophils Relative %: 83 %
Platelets: 199 K/uL (ref 150–400)
RBC: 3.3 MIL/uL — ABNORMAL LOW (ref 4.22–5.81)
RDW: 12.9 % (ref 11.5–15.5)
WBC: 11.9 K/uL — ABNORMAL HIGH (ref 4.0–10.5)
nRBC: 0 % (ref 0.0–0.2)

## 2022-01-14 LAB — GLUCOSE, CAPILLARY
Glucose-Capillary: 61 mg/dL — ABNORMAL LOW (ref 70–99)
Glucose-Capillary: 91 mg/dL (ref 70–99)
Glucose-Capillary: 228 mg/dL — ABNORMAL HIGH (ref 70–99)
Glucose-Capillary: 218 mg/dL — ABNORMAL HIGH (ref 70–99)
Glucose-Capillary: 176 mg/dL — ABNORMAL HIGH (ref 70–99)

## 2022-01-14 LAB — PHOSPHORUS: Phosphorus: 2.4 mg/dL — ABNORMAL LOW (ref 2.5–4.6)

## 2022-01-14 LAB — MAGNESIUM: Magnesium: 2.1 mg/dL (ref 1.7–2.4)

## 2022-01-14 MED ORDER — SULFAMETHOXAZOLE-TRIMETHOPRIM 800-160 MG PO TABS
1.0000 | ORAL_TABLET | ORAL | Status: DC
  Administered 2022-01-18: 1 via ORAL
  Filled 2022-01-14: qty 1

## 2022-01-14 MED ORDER — PREDNISONE 5 MG PO TABS: 30.0000 mg | ORAL_TABLET | Freq: Every day | ORAL | Status: DC

## 2022-01-14 MED ORDER — PREDNISONE 20 MG PO TABS: 20.0000 mg | ORAL_TABLET | Freq: Every day | ORAL | Status: DC

## 2022-01-14 MED ORDER — SULFAMETHOXAZOLE-TRIMETHOPRIM 800-160 MG PO TABS
1.0000 | ORAL_TABLET | ORAL | Status: DC
  Filled 2022-01-14: qty 1

## 2022-01-14 MED ORDER — PREDNISONE 5 MG PO TABS: 5.0000 mg | ORAL_TABLET | Freq: Every day | ORAL | Status: DC

## 2022-01-14 MED ORDER — PREDNISONE 20 MG PO TABS
40.0000 mg | ORAL_TABLET | Freq: Every day | ORAL | Status: DC
  Administered 2022-01-15 – 2022-01-18 (×4): 40 mg via ORAL
  Filled 2022-01-14 (×4): qty 2

## 2022-01-14 MED ORDER — PREDNISONE 5 MG PO TABS: 10.0000 mg | ORAL_TABLET | Freq: Every day | ORAL | Status: DC

## 2022-01-14 MED ORDER — NON FORMULARY
534.0000 mg | Freq: Three times a day (TID) | Status: DC
  Administered 2022-01-14 – 2022-01-15 (×2): 534 mg via ORAL

## 2022-01-14 NOTE — Progress Notes (Signed)
CSW received request for a note stating patient has been hospitalized. CSW provided to RN for patient.   Gilmore Laroche, MSW, [place] [facility]

## 2022-01-14 NOTE — Progress Notes (Addendum)
Inpatient Rehabilitation Admissions Coordinator   I met with son, Lyanne Co, outside of room. We discussed goals and expectations of a possible Cir admit pending removal of airborne precautions, insurance approval and bed availability. Lyanne Co request family tour tomorrow. We have  planned for 1330 pm. I await updated progress notes in next 48 hrs before possible pursuing Blue Medicare approval.  Danne Baxter, RN, MSN Rehab Admissions Coordinator [phone] 01/14/2022 12:08 PM

## 2022-01-14 NOTE — Progress Notes (Addendum)
SLP Note  Patient Details Name: Jeff Willis MRN: [id number] DOB: July 19, 1940   Cancelled treatment:        Brief session to check on PO intake.  RN reports no difficulty.  Pt taking larger medications crushed.  Pt and family report no further episodes concerning for aspiration.  They are choosing softer foods on regular diet. Pt not observed with POs today.  Provided brief education regarding esophageal dysphagia and suggested GI follow up should symptoms persist. Pt has no further ST needs, SLP will sign off.    Celedonio Savage, MA, [place] Office: [phone] 01/14/2022, 10:31 AM

## 2022-01-14 NOTE — TOC Progression Note (Signed)
Transition of Care [facility]) - Progression Note    Patient Details  Name: Jeff Willis MRN: [id number] Date of Birth: 1941/04/03  Transition of Care [facility]) CM/SW Contact  Bartholomew Crews, RN Phone Number: [phone] 01/14/2022, 12:02 PM  Clinical Narrative:     Spoke with patient's son, Jeff Willis, on his cell phone to discuss post acute transition. Jeff Willis is still interested in patient going to [facility], but due to proximity of CIR is interested in a tour of CIR. Message sent to liaison to advise of request. TOC following for transition needs.   Expected Discharge Plan: IP Rehab Facility Barriers to Discharge: Continued Medical Work up  Expected Discharge Plan and Services Expected Discharge Plan: [place] In-house Referral: NA Discharge Planning Services: CM Consult Post Acute Care Choice: IP Rehab Living arrangements for the past 2 months: Single Family Home                   DME Agency: NA                   Social Determinants of Health (SDOH) Interventions    Readmission Risk Interventions    01/07/2022    4:36 PM  Readmission Risk Prevention Plan  Transportation Screening Complete  PCP or Specialist Appt within 3-5 Days Complete  HRI or [place] Complete  Social Work Consult for [place] Planning/Counseling Complete  Palliative Care Screening Not Applicable  Medication Review Press photographer) Referral to Pharmacy

## 2022-01-14 NOTE — Progress Notes (Signed)
Physical Therapy Treatment Patient Details Name: Jeff Willis MRN: [id number] DOB: Jun 20, 1940 Today's Date: 01/14/2022   History of Present Illness Pt is an 81 y.o. male admitted 12/30/21 with hypoxia and failure to thrive after returning home from flight to [place] on same day; of note, pt with recent COVID+ test 12/26/21 while on cruise. Workup for IPF flare secondary to COVID and rhinovirus. ETT 9/5-9/8. PMH includes esophageal stenosis, PAD, GERD, spinal stenosis.    PT Comments    Pt received in supine, alert and agreeable to therapy session, pt spouse and daughter in law present in room and supportive during session. Emphasis on slow, pursed-lip breathing and activity pacing with exertional tasks, use of IS for endurance building and pulmonary clearance (handout given to reinforce tracking/technique), seated/standing exercises and safety with transfers and pre-gait tasks. Pt reports 6/10 modified RPE at end of session, pt making progress toward goals and needing up to 6L O2 [place] to maintain SpO2 >88% with pivotal transfers after desaturation, but able to decrease to 2L O2 [place] at rest. Also gave handout to reinforce supine LE ROM/strengthening HEP to complete between sessions. Pt continues to benefit from PT services to progress toward functional mobility goals.    Recommendations for follow up therapy are one component of a multi-disciplinary discharge planning process, led by the attending physician.  Recommendations may be updated based on patient status, additional functional criteria and insurance authorization.  Follow Up Recommendations  Acute inpatient rehab (3hours/day)     Assistance Recommended at Discharge Frequent or constant Supervision/Assistance  Patient can return home with the following A little help with walking and/or transfers;A little help with bathing/dressing/bathroom;Assistance with cooking/housework;Assist for transportation;Help with stairs or ramp for entrance    Equipment Recommendations  Rolling walker (2 wheels);BSC/3in1    Recommendations for Other Services       Precautions / Restrictions Precautions Precautions: Fall (sats) Precaution Comments: Watch SpO2 (does not wear O2 baseline); urine incontinence (has Primofit) Restrictions Weight Bearing Restrictions: No     Mobility  Bed Mobility Overal bed mobility: Needs Assistance Bed Mobility: Sit to Supine, Supine to Sit Rolling: Modified independent (Device/Increase time) Sidelying to sit: Min guard (HOB flat)   Sit to supine: Min assist   General bed mobility comments: needing extra time, mildly effortful, pt using bed rail and HOB fully elevated    Transfers Overall transfer level: Needs assistance Equipment used: 1 person hand held assist Transfers: Sit to/from Stand, Bed to chair/wheelchair/BSC Sit to Stand: Min assist   Step pivot transfers: Min assist, From elevated surface       General transfer comment: increased time to perform, cues for safe UE placement reaching back for BSC arm rests and EOB    Ambulation/Gait Ambulation/Gait assistance: Min assist Gait Distance (Feet): 4 Feet Assistive device: 1 person hand held assist Gait Pattern/deviations: Step-to pattern, Leaning posteriorly, Narrow base of support     Pre-gait activities: standing hip flexion x10 reps General Gait Details: short distance, limited due to desat with exertion and bowel urgency.       Balance Overall balance assessment: Needs assistance   Sitting balance-Leahy Scale: Good     Standing balance support: Single extremity supported, Bilateral upper extremity supported Standing balance-Leahy Scale: Poor Standing balance comment: reliant on AD and or external support                            Cognition Arousal/Alertness: Awake/alert Behavior During Therapy: Flat  affect Overall Cognitive Status: Within Functional Limits for tasks assessed                                  General Comments: pleasant and cooperative; less verbally interactive with increased fatigue and SOB        Exercises Other Exercises Other Exercises: pursed-lip breathing with arms crossed at chest and forward truncal leaning, with cues for activity pacing Other Exercises: shoulder flexion bilaterally AROM with deep breaths Other Exercises: Standing hip flexion x10 reps for BLE strengthening Other Exercises: STS x 3 reps for strengthening    General Comments General comments (skin integrity, edema, etc.): BP 121/80 (93) HR 90's bpm; SpO2 96% resting on 2L, desat to mid 80's on 2L, placed on 4L seated EOB, desat to 80% with stand pivot to [facility] so increased to 6L, then slowly improves to >88%. After transition back to supine, SpO2 94% on 4L so decreased to 2L and SpO2 86% increasing to 88% and greater after ~3 minutes rest break and cues for pursed-lip breathing.      Pertinent Vitals/Pain Pain Assessment Pain Assessment: No/denies pain Faces Pain Scale: No hurt Pain Intervention(s): Monitored during session, Repositioned           PT Goals (current goals can now be found in the care plan section) Acute Rehab PT Goals Patient Stated Goal: to go home PT Goal Formulation: With patient Time For Goal Achievement: 01/19/22 Progress towards PT goals: Progressing toward goals    Frequency    Min 3X/week      PT Plan Current plan remains appropriate       AM-PAC PT "6 Clicks" Mobility   Outcome Measure  Help needed turning from your back to your side while in a flat bed without using bedrails?: A Little Help needed moving from lying on your back to sitting on the side of a flat bed without using bedrails?: A Little Help needed moving to and from a bed to a chair (including a wheelchair)?: A Little Help needed standing up from a chair using your arms (e.g., wheelchair or bedside chair)?: A Little Help needed to walk in hospital room?: Total (<80f) Help  needed climbing 3-5 steps with a railing? : Total (not able 2/2 desaturation/fatigue currently) 6 Click Score: 14    End of Session Equipment Utilized During Treatment: Oxygen Activity Tolerance: Patient tolerated treatment well;Patient limited by fatigue Patient left: in bed;with call bell/phone within reach;with family/visitor present Nurse Communication: Mobility status PT Visit Diagnosis: Unsteadiness on feet (R26.81);Muscle weakness (generalized) (M62.81)     Time: [address]283PT Time Calculation (min) (ACUTE ONLY): 41 min  Charges:  $Gait Training: 8-22 mins $Therapeutic Exercise: 8-22 mins $Therapeutic Activity: 8-22 mins                     Rylend Pietrzak P., PTA Acute Rehabilitation Services Secure Chat Preferred 9a-5:30pm Office: [place]9/18/2023, 5:16 PM

## 2022-01-14 NOTE — Progress Notes (Signed)
PROGRESS NOTE    Jeff Willis  QJJ:[id number] DOB: 1941-04-23 DOA: 12/30/2021 PCP: Gaynelle Arabian, MD   Brief Narrative: 81 year old with past medical history significant for known esophageal stenosis status post dilation April 2023, IPF on Esbriet (FVC 84% and DLCO 45 % May 2023) patient flew to [place] for a cruise, on 12/24/2021 developed fever cough.  On 12/26/2021 on the cruise he was diagnosed with COVID-19 infection, declined Paxlovid at that time, due to previously experienced side effects from it.  Continue to have cough, failure to thrive, returned to [place] via flight 12/30/2021.  Pulse ox was checked and was found to be hypoxic presented via EMS.  COVID PCR positive.  CT chest with contrast negative for PE, CTA chest neg for PE. Admitted to the hospital 12/30/2021 with acute hypoxic respiratory failure secondary to COVID-19. On 9/5 was intubated due to respiratory failure and extubated on 9/8. TR[facility] assumed care.    Assessment & Plan:   Principal Problem:   Acute respiratory failure with hypoxia (HCC) Active Problems:   Interstitial lung disease ([place])   COVID-19 virus infection   Hyponatremia   History of sinus tachycardia   Pneumonia due to COVID-19 virus   Protein-calorie malnutrition, severe   Pressure injury of skin  Acute Hypoxic respiratory failure IPF flare in the setting of COVID-19 and rhinovirus Possible aspiration pneumonia on 01/10/2022 Now on 2-4 L of O2 Patient subsequently 9/5 developed worsening respiratory failure requiring intubation, extubated on 9/8 On 9/14 pt requiring about 20 L of high flow nasal cannula on 9/14, PCCM reconsulted, appreciate recs CTA chest negative for PE Completed tapered dose of dexamethasone on 01/10/2022, started  Solu-Medrol on 9/14 as per PCCM, switched to prednisone long taper as per PCCM.  Bactrim 3 times per week as prophylaxis, resume home Esbriet Completed 5 days antibiotics, started Unasyn on 01/10/2022, plan for 5  days as per PCCM Continue with flutter valve, PRN Mucinex.  Patient needs to be on airborne precaution for covid for 21 days from 8/30 (Covid test perform at the cruise) . Off isolation 9/21 Patient accepted to [facility] inpatient rehab, but would prefer CIR due to proximity  Abdominal distention/?Constipation Reported multiple bowel movement Noted some distention, denies any significant pain Abdominal x-ray unremarkable, no bowel dilatation, no significant stool burden is noted Continue laxatives  Tachycardia/A-fib Hypertension Demand ischemia Continue with metoprolol PRN hydralazine.   Diabetes mellitus type 2 SSI, Accu-Cheks, hypoglycemic protocol  Known esophageal dilation April 2023 History of dysphagia for solid and spices PPI-- change to BID.  MBS: No evidence of aspiration,  A 13 mm pill  lodged briefly in the vallecular space, then above the UES.  It passed through the UES into the cervical esophagus, then returned to the hypopharynx.  A pureed bolus helped pass it through the esophagus without incident/  Patient to follow regular diet but to avoid large bolus of food, hard meat, should eat soft food  Anemia of chronic disease Hb stable.   Severe protein caloric malnutrition due to acute illness Failure to thrive due to acute illness, weight loss from IPF -BSM: Diet advanced to regular, soft, avoid meat, bread or large amount of bolus.  -Nutritionist consulted.   Back pain: acute on chronic. Spondylosis.  Started low dose gabapentin 100 mg TID 9/10. Continue with current dose. Pain is getting better, unable to tolerate high dose. Continue with oxycodone PRN -MRI Lumbar Spine: 1. L5-S1 right paracentral disc extrusion with caudal migration, new from the prior exam,  which effaces the right lateral recess and contacts the descending right S1 nerve roots. Edema and enhancement at the superior aspect of L4, which appears concentrated about the superior endplate, with edema in  the intervertebral disc, and a smaller focus of similar abnormal signal at the inferior endplate of L3. These findings are nonspecific and can be seen in the setting of degenerative changes/acute Schmorl's node formation, which can be painful, although early discitis-osteomyelitis can appear similar. L3-L4 mild spinal canal stenosis, unchanged. L4-L5 and L5-S1 mild bilateral neural foraminal narrowing, unchanged. Neurosurgery consulted, they think MRI finding consistent with degenerative spondylosis throughout spine. No need for acute intervention.  Blood culture: no growth to date/ Blood culture from admission negative. Recent ESR normal, CRP normal. Inflammatory marker initially elevate on admission likely related to covid.   Elevated D dimer; in setting of covid Doppler LE negative for DVT On Lovenox. Level decreasing.   See wound care documentation below.  Pressure Injury 01/05/22 Buttocks Right Stage 2 -  Partial thickness loss of dermis presenting as a shallow open injury with a red, pink wound bed without slough. (Active)  01/05/22 2130  Location: Buttocks  Location Orientation: Right  Staging: Stage 2 -  Partial thickness loss of dermis presenting as a shallow open injury with a red, pink wound bed without slough.  Wound Description (Comments):   Present on Admission:   Dressing Type Foam - Lift dressing to assess site every shift 01/13/22 0800     Nutrition Problem: Severe Malnutrition Etiology: chronic illness    Signs/Symptoms: severe muscle depletion, severe fat depletion    Interventions: Ensure Enlive (each supplement provides 350kcal and 20 grams of protein), MVI, Liberalize Diet  Estimated body mass index is 19.89 kg/m as calculated from the following:   Height as of this encounter: _0  (1.676 m).   Weight as of this encounter: 55.9 kg.   DVT prophylaxis: Lovenox Code Status: Full code Family Communication: Son at bedside Disposition Plan:  Status is:  Inpatient Remains inpatient appropriate because: management resp failure    Consultants:  CCM  Procedures:  Mechanical ventilation  Antimicrobials:  Unasyn    Subjective: Patient denies any new complaints.  Son at bedside.  Want to go to CIR instead of [facility] inpatient rehab    Objective: Vitals:   01/14/22 0000 01/14/22 0400 01/14/22 0748 01/14/22 1224  BP: 125/74 (!) 145/75 129/77 (!) 137/91  Pulse: 67 71 79 83  Resp: _1 (!) 36  Temp: 98.2 F (36.8 C) 98.2 F (36.8 C) (!) 97.3 F (36.3 C) 97.9 F (36.6 C)  TempSrc: Oral Oral Oral Oral  SpO2: 100% 100% 96% 96%  Weight:      Height:        Intake/Output Summary (Last 24 hours) at 01/14/2022 1529 Last data filed at 01/14/2022 0750 Gross per 24 hour  Intake 200.11 ml  Output 1100 ml  Net -899.89 ml   Filed Weights   01/11/22 0500 01/12/22 0500 01/13/22 0500  Weight: 52.4 kg 52 kg 55.9 kg    Examination: General: NAD  Cardiovascular: S1, S2 present Respiratory: Mild bibasilar crackles noted, diminished breath sounds Abdomen: Soft, nontender, nondistended, bowel sounds present Musculoskeletal: No bilateral pedal edema noted Skin: Normal Psychiatry: Normal mood    Data Reviewed: I have personally reviewed following labs and imaging studies  CBC: Recent Labs  Lab 01/10/22 0307 01/11/22 0532 01/12/22 0727 01/13/22 0412 01/14/22 0341  WBC 11.8* 22.1* 17.4* 13.9* 11.9*  NEUTROABS 9.5* 20.8*  14.8* 11.8* 10.0*  HGB 12.0* 11.8* 11.0* 11.0* 10.6*  HCT 33.9* 33.8* 31.7* 31.9* 31.6*  MCV 93.6 94.4 95.5 95.8 95.8  PLT 207 187 182 186 099   Basic Metabolic Panel: Recent Labs  Lab 01/10/22 0307 01/11/22 0532 01/12/22 0727 01/13/22 0412 01/14/22 0341  NA 134* 135 135 138 135  K 4.2 4.1 4.3 4.1 3.6  CL 102 101 99 103 100  CO2 _0 GLUCOSE 101* 121* 78 81 105*  BUN 30* 30* 28* 23 20  CREATININE 0.91 1.00 0.89 0.95 0.87  CALCIUM 8.7* 8.5* 8.4* 8.5* 8.3*  MG 2.0 1.9 1.9 2.1 2.1   PHOS 3.3 4.5 2.3* 2.7 2.4*   GFR: Estimated Creatinine Clearance: 53.5 mL/min (by C-G formula based on SCr of 0.87 mg/dL). Liver Function Tests: No results for input(s): "AST", "ALT", "ALKPHOS", "BILITOT", "PROT", "ALBUMIN" in the last 168 hours.  No results for input(s): "LIPASE", "AMYLASE" in the last 168 hours. No results for input(s): "AMMONIA" in the last 168 hours. Coagulation Profile: No results for input(s): "INR", "PROTIME" in the last 168 hours.  Cardiac Enzymes: No results for input(s): "CKTOTAL", "CKMB", "CKMBINDEX", "TROPONINI" in the last 168 hours. BNP (last 3 results) Recent Labs    09/07/21 1218  PROBNP 211.0*   HbA1C: No results for input(s): "HGBA1C" in the last 72 hours. CBG: Recent Labs  Lab 01/13/22 1553 01/13/22 2235 01/14/22 0751 01/14/22 0828 01/14/22 1231  GLUCAP 285* 212* 61* 91 228*   Lipid Profile: No results for input(s): "CHOL", "HDL", "LDLCALC", "TRIG", "CHOLHDL", "LDLDIRECT" in the last 72 hours.  Thyroid Function Tests: No results for input(s): "TSH", "T4TOTAL", "FREET4", "T3FREE", "THYROIDAB" in the last 72 hours. Anemia Panel: No results for input(s): "VITAMINB12", "FOLATE", "FERRITIN", "TIBC", "IRON", "RETICCTPCT" in the last 72 hours.  Sepsis Labs: No results for input(s): "PROCALCITON", "LATICACIDVEN" in the last 168 hours.  Recent Results (from the past 240 hour(s))  Culture, blood (Routine X 2) w Reflex to ID Panel     Status: None   Collection Time: 01/07/22  9:08 AM   Specimen: BLOOD LEFT ARM  Result Value Ref Range Status   Specimen Description BLOOD LEFT ARM  Final   Special Requests   Final    BOTTLES DRAWN AEROBIC AND ANAEROBIC Blood Culture adequate volume   Culture   Final    NO GROWTH 5 DAYS Performed at [facility], [address], [place], [place] [address]    Report Status 01/12/2022 FINAL  Final  Culture, blood (Routine X 2) w Reflex to ID Panel     Status: None   Collection Time: 01/07/22  9:08 AM    Specimen: BLOOD LEFT HAND  Result Value Ref Range Status   Specimen Description BLOOD LEFT HAND  Final   Special Requests   Final    BOTTLES DRAWN AEROBIC AND ANAEROBIC Blood Culture adequate volume   Culture   Final    NO GROWTH 5 DAYS Performed at [facility], [place] [address], [place], [place] [address]    Report Status 01/12/2022 FINAL  Final         Radiology Studies: No results found.      Scheduled Meds:  aspirin  81 mg Oral Daily   enoxaparin (LOVENOX) injection  40 mg Subcutaneous Q24H   feeding supplement  237 mL Oral BID BM   gabapentin  100 mg Oral TID   insulin aspart  0-6 Units Subcutaneous TID WC  insulin detemir  6 Units Subcutaneous Q12H   lidocaine  1 patch Transdermal Q24H   metoprolol tartrate  12.5 mg Oral BID   multivitamin with minerals  1 tablet Oral Daily   NON FORMULARY 534 mg  534 mg Oral TID   pantoprazole  40 mg Oral BID   polyethylene glycol  17 g Oral BID   [START ON 01/15/2022] predniSONE  40 mg Oral Q breakfast   Followed by   Derrill Memo ON 01/29/2022] predniSONE  30 mg Oral Q breakfast   Followed by   Derrill Memo ON 02/12/2022] predniSONE  20 mg Oral Q breakfast   Followed by   Derrill Memo ON 02/26/2022] predniSONE  10 mg Oral Q breakfast   Followed by   Derrill Memo ON 03/12/2022] predniSONE  5 mg Oral Q breakfast   rosuvastatin  10 mg Oral QHS   senna  2 tablet Oral Daily   [START ON 01/18/2022] sulfamethoxazole-trimethoprim  1 tablet Oral Once per day on Mon Wed Fri   Continuous Infusions:  sodium chloride Stopped (01/01/22 0609)   ampicillin-sulbactam (UNASYN) IV 3 g (01/14/22 1332)     LOS: 14 days       Alma Friendly, MD [facility]   If 7PM-7AM, please contact night-coverage [email or web address]  01/14/2022, 3:29 PM

## 2022-01-14 NOTE — Progress Notes (Signed)
NAME:  Jeff Willis, MRN:  [id number], DOB:  [birth year], LOS: 50 ADMISSION DATE:  12/30/2021, CONSULTATION DATE:  12/31/21 REFERRING MD:  Dr. Jennette Kettle, CHIEF COMPLAINT:  IPF patient with acute Covid-19   History of Present Illness:  Jeff Willis -has known to have esophageal stenosis status post dilatation April 2023 by Dr. Saralyn Pilar on.  IPF on esbreit (FVC 84% and DLCO 45% - may 2023) on esbreit. On room air. Noral RHC 11/22/21.  Follows with Dr Chase Caller. HRCT 821'@3'$  with worsening.  Flew to [place] for a cruise.  On 12/24/2021 developed fever and cough.  On 12/26/2021 and the cruciate was diagnosed with COVID 19 acute infection.  Was offered antiviral Paxlovid but he refused because of negative experience previously with side effects.  He continued to have fever cough and failure to thrive, low appetite and being bedbound.  He returned to [place] via flight on 12/30/2021 and at home was continuing to cough.  Pulse ox check at home showed hypoxemia and therefore was brought to the emergency department via EMS.  Here he is resting on 3 L nasal cannula with stable work of breathing.  COVID PCR is positive.  CT chest with contrast [none PE protocol but no big PE noted.  He has left greater than right diffuse groundglass opacities.  Pertinent  Medical History  IPF on esbriet, UIP, esophageal stenosis s/p dilatation 2023 Back pain, Elevated PSA, Essential tremor, Hypercholesteremia, Neuropathy, Prostate cancer ([place]), and Sinus tachycardia (01/30/2018).   Reports that he has never smoked. He has never used smokeless tobacco.   Has a past surgical history that includes Lumbar microdiscectomy; Radioactive seed implant (N/A, 02/24/2018); SPACE OAR INSTILLATION (N/A, 02/24/2018); Esophagogastroduodenoscopy (egd) with propofol (N/A, 08/10/2021); Colonoscopy with propofol (N/A, 08/10/2021); Esophageal dilation (08/10/2021); polypectomy (08/10/2021); Hemostasis clip placement (08/10/2021); and RIGHT  HEART CATH (N/A, 11/22/2021).  Significant Hospital Events: Including procedures, antibiotic start and stop dates in addition to other pertinent events   12/30/2021 - admitted 9/4- BiPAP, transferred to ICU 9/5- Intubated 0518 d/t respiratory failure, A line placed, levophed [phone] 9/8-extubated 9/11-transferred to [facility] 9/12-CTA chest negative for acute PE, KUB neg 9/13-Possible aspiration event with hypoxemia noted by family 9/14-CXR with some worsening of RML, worsening hypoxia requiring high flow nasal cannula to 20 L 9/15 FiO2 weaned to 70%, still 20 L HFNC 9/18 on 2-3 L with sats of 94%, being evaluated for rehab  Interim History / Subjective:  No acute events overnight Family updated at bedside He is on 3 L O2  with good sats   Objective   Blood pressure (!) 137/91, pulse 83, temperature 97.9 F (36.6 C), temperature source Oral, resp. rate (!) 36, height '5\' 6"'$  (1.676 m), weight 55.9 kg, SpO2 96 %.        Intake/Output Summary (Last 24 hours) at 01/14/2022 1301 Last data filed at 01/14/2022 0750 Gross per 24 hour  Intake 739.98 ml  Output 1500 ml  Net -760.02 ml  UOP 950 (0.6) Filed Weights   01/11/22 0500 01/12/22 0500 01/13/22 0500  Weight: 52.4 kg 52 kg 55.9 kg   Examination: General: NAD, sitting on bedside commode HENT: NCAT, nasal canula in place,  MM pink and moist Lungs: Bilateral chest excursion, bibasilar crackles, RR 28-30 on BSC Cardiovascular: RRR no m/r/g Abdomen: Mildly distended, nontender to palpation, Body mass index is 19.89 kg/m.  Extremities: No LE edema, warm,  no obvious deformities Neuro: alert oriented x 3 and responsive to all questions, following commands  Resolved Hospital Problem list   oliguria Hypotension Bradycardia Hyperkalemia Mild to moderate hyponatremia  Assessment & Plan:  Acute IPF flare in setting of Covid-19 and Rhinovirus with aspiration event - Wean O2 for goal SpO2 92% and above - continue solumedrol '60mg'$  daily,  will transition to oral prednisone taper tomorrow - Plan to start bactrim prophylaxis 3 days per week tomorrow - Plan to resume esbriet treatment for IPF - Complete course of unasyn for 5 days - Will need home O2 orders with humidification and portable oxygen concentrator order. - Will transition off solumedrol and start a prednisone taper 40 mg daily x 2 weeks, then 30 mg daily x 2 weeks, then 10 mg daily x 2 weeks , then 5 mg daily x 2 weeks then stop. Bactrim DS three days per week as prophylaxis,  Resume Home Esbriet Dosing, family bringing from home, will need LFT's monthly for follow up.  CMET in am to evaluate LFT's Will need weight checks to ensure no weight loss with Esbriet Will need follow up while in rehab  Known esophageal dilatation April 2023 History of dysphagia for solids and spices 01/01/22 - possible aspiration event  -speech evaluation completed 9/15, oral diet resumed -Continue PPI BID   Physical Deconditioning - inpatient rehab consult placed>> will tour inpatient rehab tomorrow with family - discussed with family about going home with oxygen and home PT. Will determine based on further PT evaluations  Best Practice (right click and "Reselect all SmartList Selections" daily)   Per primary  Labs   CBC: Recent Labs  Lab 01/10/22 0307 01/11/22 0532 01/12/22 0727 01/13/22 0412 01/14/22 0341  WBC 11.8* 22.1* 17.4* 13.9* 11.9*  NEUTROABS 9.5* 20.8* 14.8* 11.8* 10.0*  HGB 12.0* 11.8* 11.0* 11.0* 10.6*  HCT 33.9* 33.8* 31.7* 31.9* 31.6*  MCV 93.6 94.4 95.5 95.8 95.8  PLT 207 187 182 186 177    Basic Metabolic Panel: Recent Labs  Lab 01/10/22 0307 01/11/22 0532 01/12/22 0727 01/13/22 0412 01/14/22 0341  NA 134* 135 135 138 135  K 4.2 4.1 4.3 4.1 3.6  CL 102 101 99 103 100  CO2 '26 27 26 31 29  '$ GLUCOSE 101* 121* 78 81 105*  BUN 30* 30* 28* 23 20  CREATININE 0.91 1.00 0.89 0.95 0.87  CALCIUM 8.7* 8.5* 8.4* 8.5* 8.3*  MG 2.0 1.9 1.9 2.1 2.1  PHOS  3.3 4.5 2.3* 2.7 2.4*   GFR: Estimated Creatinine Clearance: 53.5 mL/min (by C-G formula based on SCr of 0.87 mg/dL). Recent Labs  Lab 01/11/22 0532 01/12/22 0727 01/13/22 0412 01/14/22 0341  WBC 22.1* 17.4* 13.9* 11.9*    Liver Function Tests: No results for input(s): "AST", "ALT", "ALKPHOS", "BILITOT", "PROT", "ALBUMIN" in the last 168 hours. No results for input(s): "LIPASE", "AMYLASE" in the last 168 hours. No results for input(s): "AMMONIA" in the last 168 hours.  ABG    Component Value Date/Time   PHART 7.428 01/03/2022 1525   PCO2ART 49.0 (H) 01/03/2022 1525   PO2ART 136 (H) 01/03/2022 1525   HCO3 32.5 (H) 01/03/2022 1525   TCO2 34 (H) 01/03/2022 1525   O2SAT 99 01/03/2022 1525     Coagulation Profile: No results for input(s): "INR", "PROTIME" in the last 168 hours.   Cardiac Enzymes: No results for input(s): "CKTOTAL", "CKMB", "CKMBINDEX", "TROPONINI" in the last 168 hours.  HbA1C: Hgb A1c MFr Bld  Date/Time Value Ref Range Status  01/01/2022 05:30 AM 5.8 (H) 4.8 - 5.6 % Final    Comment:    (  NOTE) Pre diabetes:          5.7%-6.4%  Diabetes:              >6.4%  Glycemic control for   <7.0% adults with diabetes     CBG: Recent Labs  Lab 01/13/22 1553 01/13/22 2235 01/14/22 0751 01/14/22 0828 01/14/22 1231  GLUCAP 285* 212* 61* 91 228*    Critical care time: n/a    Magdalen Spatz, MSN, AGACNP-BC [place] for personal pager PCCM on call pager [phone] until 7 pm  Please call Elink 7p-7a. [phone] 01/14/2022 2:21 PM

## 2022-01-15 DIAGNOSIS — J9601 Acute respiratory failure with hypoxia: Secondary | ICD-10-CM | POA: Diagnosis not present

## 2022-01-15 DIAGNOSIS — U071 COVID-19: Secondary | ICD-10-CM | POA: Diagnosis not present

## 2022-01-15 DIAGNOSIS — E43 Unspecified severe protein-calorie malnutrition: Secondary | ICD-10-CM | POA: Diagnosis not present

## 2022-01-15 DIAGNOSIS — J849 Interstitial pulmonary disease, unspecified: Secondary | ICD-10-CM | POA: Diagnosis not present

## 2022-01-15 LAB — CBC WITH DIFFERENTIAL/PLATELET
Abs Immature Granulocytes: 0.05 10*3/uL (ref 0.00–0.07)
Basophils Absolute: 0 10*3/uL (ref 0.0–0.1)
Basophils Relative: 0 %
Eosinophils Absolute: 0 10*3/uL (ref 0.0–0.5)
Eosinophils Relative: 0 %
HCT: 31.6 % — ABNORMAL LOW (ref 39.0–52.0)
Hemoglobin: 10.8 g/dL — ABNORMAL LOW (ref 13.0–17.0)
Immature Granulocytes: 1 %
Lymphocytes Relative: 10 %
Lymphs Abs: 1.1 10*3/uL (ref 0.7–4.0)
MCH: 32.9 pg (ref 26.0–34.0)
MCHC: 34.2 g/dL (ref 30.0–36.0)
MCV: 96.3 fL (ref 80.0–100.0)
Monocytes Absolute: 0.9 10*3/uL (ref 0.1–1.0)
Monocytes Relative: 8 %
Neutro Abs: 8.8 10*3/uL — ABNORMAL HIGH (ref 1.7–7.7)
Neutrophils Relative %: 81 %
Platelets: 185 10*3/uL (ref 150–400)
RBC: 3.28 MIL/uL — ABNORMAL LOW (ref 4.22–5.81)
RDW: 12.9 % (ref 11.5–15.5)
WBC: 10.8 10*3/uL — ABNORMAL HIGH (ref 4.0–10.5)
nRBC: 0 % (ref 0.0–0.2)

## 2022-01-15 LAB — COMPREHENSIVE METABOLIC PANEL
ALT: 109 U/L — ABNORMAL HIGH (ref 0–44)
AST: 40 U/L (ref 15–41)
Albumin: 2.6 g/dL — ABNORMAL LOW (ref 3.5–5.0)
Alkaline Phosphatase: 54 U/L (ref 38–126)
Anion gap: 5 (ref 5–15)
BUN: 18 mg/dL (ref 8–23)
CO2: 29 mmol/L (ref 22–32)
Calcium: 8.6 mg/dL — ABNORMAL LOW (ref 8.9–10.3)
Chloride: 102 mmol/L (ref 98–111)
Creatinine, Ser: 0.85 mg/dL (ref 0.61–1.24)
GFR, Estimated: >60 mL/min (ref >60)
Glucose, Bld: 110 mg/dL — ABNORMAL HIGH (ref 70–99)
Potassium: 3.9 mmol/L (ref 3.5–5.1)
Sodium: 136 mmol/L (ref 135–145)
Total Bilirubin: 0.6 mg/dL (ref 0.3–1.2)
Total Protein: 4.8 g/dL — ABNORMAL LOW (ref 6.5–8.1)

## 2022-01-15 LAB — GLUCOSE, CAPILLARY
Glucose-Capillary: 95 mg/dL (ref 70–99)
Glucose-Capillary: 87 mg/dL (ref 70–99)
Glucose-Capillary: 231 mg/dL — ABNORMAL HIGH (ref 70–99)
Glucose-Capillary: 256 mg/dL — ABNORMAL HIGH (ref 70–99)

## 2022-01-15 LAB — MAGNESIUM: Magnesium: 2 mg/dL (ref 1.7–2.4)

## 2022-01-15 LAB — PHOSPHORUS: Phosphorus: 2.4 mg/dL — ABNORMAL LOW (ref 2.5–4.6)

## 2022-01-15 MED ORDER — INSULIN DETEMIR 100 UNIT/ML ~~LOC~~ SOLN
6.0000 [IU] | Freq: Every day | SUBCUTANEOUS | Status: DC
  Administered 2022-01-16 – 2022-01-18 (×3): 6 [IU] via SUBCUTANEOUS
  Filled 2022-01-15 (×3): qty 0.06

## 2022-01-15 MED ORDER — TAMSULOSIN HCL 0.4 MG PO CAPS
0.4000 mg | ORAL_CAPSULE | Freq: Every day | ORAL | Status: DC
  Administered 2022-01-15 – 2022-01-17 (×3): 0.4 mg via ORAL
  Filled 2022-01-15 (×3): qty 1

## 2022-01-15 NOTE — Consult Note (Signed)
Patient with reported urinary retention admitted with Covid. Had to I&O cath and remains in retention.  Has h/o prostate cancer s/p brachytherapy with finasteride downsizing  Would recommend foley placement and start flomax 0.'4mg'$  daily  If he is still in inpatient rehab in 1 week, he may undergo a voiding trial as long as he is mobile and clinically improving from a rehab standpoint.  We will make him an appointment with urology for about 2 weeks from now.  I called and discussed the plan with his son.

## 2022-01-15 NOTE — Progress Notes (Signed)
PROGRESS NOTE    Jeff Willis  BZJ:[id number] DOB: [birth year] DOA: 12/30/2021 PCP: Gaynelle Arabian, MD   Brief Narrative: 81 year old with past medical history significant for known esophageal stenosis status post dilation April 2023, IPF on Esbriet (FVC 84% and DLCO 45 % May 2023) patient flew to [place] for a cruise, on 12/24/2021 developed fever cough.  On 12/26/2021 on the cruise he was diagnosed with COVID-19 infection, declined Paxlovid at that time, due to previously experienced side effects from it.  Continue to have cough, failure to thrive, returned to [place] via flight 12/30/2021.  Pulse ox was checked and was found to be hypoxic presented via EMS.  COVID PCR positive.  CT chest with contrast negative for PE, CTA chest neg for PE. Admitted to the hospital 12/30/2021 with acute hypoxic respiratory failure secondary to COVID-19. On 9/5 was intubated due to respiratory failure and extubated on 9/8. TRH assumed care.    Assessment & Plan:   Principal Problem:   Acute respiratory failure with hypoxia (HCC) Active Problems:   Interstitial lung disease ([place])   COVID-19 virus infection   Hyponatremia   History of sinus tachycardia   Pneumonia due to COVID-19 virus   Protein-calorie malnutrition, severe   Pressure injury of skin  Acute Hypoxic respiratory failure IPF flare in the setting of COVID-19 and rhinovirus Possible aspiration pneumonia on 01/10/2022- now resolved Now on 2-4 L of O2 Patient subsequently 9/5 developed worsening respiratory failure requiring intubation, extubated on 9/8 On 9/14 pt requiring about 20 L of high flow nasal cannula on 9/14, PCCM reconsulted, appreciate recs CTA chest negative for PE Completed tapered dose of dexamethasone on 01/10/2022, started  Solu-Medrol on 9/14 as per PCCM, switched to prednisone long taper as per PCCM.  Bactrim 3 times per week as prophylaxis, resume home Esbriet Completed 5 days antibiotics, started Unasyn on 01/10/2022,  plan for 5 days as per PCCM Continue with flutter valve, PRN Mucinex.  Patient needs to be on airborne precaution for covid for 21 days from 8/30 (Covid test perform at the cruise) . Off isolation starting 9/21 Patient accepted to [facility] inpatient rehab, but would prefer CIR due to proximity  Acute urinary retention Bladder scan showed >999 cc on 9/19 S/p in and out cath drained 1100 cc Urology consulted as per patient's request, recommend to insert Foley and attempt to voiding trial while in CIR once mobile, start Flomax  Tachycardia/A-fib Hypertension Demand ischemia Continue with metoprolol PRN hydralazine   Diabetes mellitus type 2 SSI, Accu-Cheks, hypoglycemic protocol  Known esophageal dilation April 2023 History of dysphagia for solid and spices PPI-- change to BID.  MBS: No evidence of aspiration,  A 13 mm pill  lodged briefly in the vallecular space, then above the UES.  It passed through the UES into the cervical esophagus, then returned to the hypopharynx.  A pureed bolus helped pass it through the esophagus without incident/  Patient to follow regular diet but to avoid large bolus of food, hard meat, should eat soft food  Anemia of chronic disease Hb stable.   Severe protein caloric malnutrition due to acute illness Failure to thrive due to acute illness, weight loss from IPF -BSM: Diet advanced to regular, soft, avoid meat, bread or large amount of bolus.  -Nutritionist consulted.   Back pain: acute on chronic. Spondylosis  Started low dose gabapentin 100 mg TID 9/10. Continue with current dose. Pain is getting better, unable to tolerate high dose. Continue with oxycodone PRN MRI  Lumbar Spine: 1. L5-S1 right paracentral disc extrusion with caudal migration, new from the prior exam. Neurosurgery consulted, they think MRI finding consistent with degenerative spondylosis throughout spine. No need for acute intervention.  Blood culture: no growth to date/ Blood culture  from admission negative. Recent ESR normal, CRP normal. Inflammatory marker initially elevate on admission likely related to covid.   Elevated D dimer; in setting of covid Doppler LE negative for DVT On Lovenox. Level decreasing.   See wound care documentation below.  Pressure Injury 01/05/22 Buttocks Right Stage 2 -  Partial thickness loss of dermis presenting as a shallow open injury with a red, pink wound bed without slough. (Active)  01/05/22 2130  Location: Buttocks  Location Orientation: Right  Staging: Stage 2 -  Partial thickness loss of dermis presenting as a shallow open injury with a red, pink wound bed without slough.  Wound Description (Comments):   Present on Admission:   Dressing Type Foam - Lift dressing to assess site every shift 01/15/22 0800     Nutrition Problem: Severe Malnutrition Etiology: chronic illness    Signs/Symptoms: severe muscle depletion, severe fat depletion    Interventions: Ensure Enlive (each supplement provides 350kcal and 20 grams of protein), MVI, Liberalize Diet  Estimated body mass index is 20.92 kg/m as calculated from the following:   Height as of this encounter: $RemoveBeforeD'5\' 6"'lXkPImSVmlWQUf$  (1.676 m).   Weight as of this encounter: 58.8 kg.   DVT prophylaxis: Lovenox Code Status: Full code Family Communication: Son at bedside Disposition Plan:  Status is: Inpatient Remains inpatient appropriate because: management resp failure    Consultants:  CCM  Procedures:  Mechanical ventilation  Antimicrobials:  Unasyn    Subjective: Patient denies any new complaints, wants to speak with urology concerning acute urinary retention and insisting on wanting a PSA test    Objective: Vitals:   01/15/22 0600 01/15/22 0700 01/15/22 0800 01/15/22 1123  BP:   132/75 126/66  Pulse: 81 70 77 71  Resp: $Remo'17 19 19 20  'LMgEH$ Temp:   98.3 F (36.8 C) 97.9 F (36.6 C)  TempSrc:   Oral Oral  SpO2: 96% 99% 99% 99%  Weight:      Height:        Intake/Output  Summary (Last 24 hours) at 01/15/2022 1628 Last data filed at 01/15/2022 1300 Gross per 24 hour  Intake 200 ml  Output 1400 ml  Net -1200 ml   Filed Weights   01/12/22 0500 01/13/22 0500 01/15/22 0500  Weight: 52 kg 55.9 kg 58.8 kg    Examination: General: NAD  Cardiovascular: S1, S2 present Respiratory: Mild bibasilar crackles noted, diminished breath sounds Abdomen: Soft, nontender, nondistended, bowel sounds present Musculoskeletal: No bilateral pedal edema noted Skin: Normal Psychiatry: Normal mood    Data Reviewed: I have personally reviewed following labs and imaging studies  CBC: Recent Labs  Lab 01/11/22 0532 01/12/22 0727 01/13/22 0412 01/14/22 0341 01/15/22 0244  WBC 22.1* 17.4* 13.9* 11.9* 10.8*  NEUTROABS 20.8* 14.8* 11.8* 10.0* 8.8*  HGB 11.8* 11.0* 11.0* 10.6* 10.8*  HCT 33.8* 31.7* 31.9* 31.6* 31.6*  MCV 94.4 95.5 95.8 95.8 96.3  PLT 187 182 186 199 774   Basic Metabolic Panel: Recent Labs  Lab 01/11/22 0532 01/12/22 0727 01/13/22 0412 01/14/22 0341 01/15/22 0244  NA 135 135 138 135 136  K 4.1 4.3 4.1 3.6 3.9  CL 101 99 103 100 102  CO2 $Re'27 26 31 29 29  'BGS$ GLUCOSE 121* 78 81 105* 110*  BUN 30* 28* $Remov'23 20 18  'PHbXVb$ CREATININE 1.00 0.89 0.95 0.87 0.85  CALCIUM 8.5* 8.4* 8.5* 8.3* 8.6*  MG 1.9 1.9 2.1 2.1 2.0  PHOS 4.5 2.3* 2.7 2.4* 2.4*   GFR: Estimated Creatinine Clearance: 57.6 mL/min (by C-G formula based on SCr of 0.85 mg/dL). Liver Function Tests: Recent Labs  Lab 01/15/22 0244  AST 40  ALT 109*  ALKPHOS 54  BILITOT 0.6  PROT 4.8*  ALBUMIN 2.6*    No results for input(s): "LIPASE", "AMYLASE" in the last 168 hours. No results for input(s): "AMMONIA" in the last 168 hours. Coagulation Profile: No results for input(s): "INR", "PROTIME" in the last 168 hours.  Cardiac Enzymes: No results for input(s): "CKTOTAL", "CKMB", "CKMBINDEX", "TROPONINI" in the last 168 hours. BNP (last 3 results) Recent Labs    09/07/21 1218  PROBNP 211.0*    HbA1C: No results for input(s): "HGBA1C" in the last 72 hours. CBG: Recent Labs  Lab 01/14/22 1231 01/14/22 1606 01/14/22 2158 01/15/22 0813 01/15/22 1124  GLUCAP 228* 218* 176* 95 87   Lipid Profile: No results for input(s): "CHOL", "HDL", "LDLCALC", "TRIG", "CHOLHDL", "LDLDIRECT" in the last 72 hours.  Thyroid Function Tests: No results for input(s): "TSH", "T4TOTAL", "FREET4", "T3FREE", "THYROIDAB" in the last 72 hours. Anemia Panel: No results for input(s): "VITAMINB12", "FOLATE", "FERRITIN", "TIBC", "IRON", "RETICCTPCT" in the last 72 hours.  Sepsis Labs: No results for input(s): "PROCALCITON", "LATICACIDVEN" in the last 168 hours.  Recent Results (from the past 240 hour(s))  Culture, blood (Routine X 2) w Reflex to ID Panel     Status: None   Collection Time: 01/07/22  9:08 AM   Specimen: BLOOD LEFT ARM  Result Value Ref Range Status   Specimen Description BLOOD LEFT ARM  Final   Special Requests   Final    BOTTLES DRAWN AEROBIC AND ANAEROBIC Blood Culture adequate volume   Culture   Final    NO GROWTH 5 DAYS Performed at [facility], [address], [place], [place] [address]    Report Status 01/12/2022 FINAL  Final  Culture, blood (Routine X 2) w Reflex to ID Panel     Status: None   Collection Time: 01/07/22  9:08 AM   Specimen: BLOOD LEFT HAND  Result Value Ref Range Status   Specimen Description BLOOD LEFT HAND  Final   Special Requests   Final    BOTTLES DRAWN AEROBIC AND ANAEROBIC Blood Culture adequate volume   Culture   Final    NO GROWTH 5 DAYS Performed at [facility], [place] [address], [place], [place] [address]    Report Status 01/12/2022 FINAL  Final         Radiology Studies: No results found.      Scheduled Meds:  aspirin  81 mg Oral Daily   enoxaparin (LOVENOX) injection  40 mg Subcutaneous Q24H   feeding supplement  237 mL Oral BID BM   gabapentin  100 mg Oral TID   insulin aspart  0-6 Units Subcutaneous TID WC    [START ON 01/16/2022] insulin detemir  6 Units Subcutaneous Daily   lidocaine  1 patch Transdermal Q24H   metoprolol tartrate  12.5 mg Oral BID   multivitamin with minerals  1 tablet Oral Daily   NON FORMULARY 534 mg  534 mg Oral TID   pantoprazole  40 mg Oral BID   polyethylene glycol  17 g Oral BID   predniSONE  40 mg Oral Q breakfast  Followed by   Derrill Memo ON 01/29/2022] predniSONE  30 mg Oral Q breakfast   Followed by   Derrill Memo ON 02/12/2022] predniSONE  20 mg Oral Q breakfast   Followed by   Derrill Memo ON 02/26/2022] predniSONE  10 mg Oral Q breakfast   Followed by   Derrill Memo ON 03/12/2022] predniSONE  5 mg Oral Q breakfast   rosuvastatin  10 mg Oral QHS   senna  2 tablet Oral Daily   [START ON 01/18/2022] sulfamethoxazole-trimethoprim  1 tablet Oral Once per day on Mon Wed Fri   tamsulosin  0.4 mg Oral QPC supper   Continuous Infusions:  sodium chloride Stopped (01/01/22 0609)   ampicillin-sulbactam (UNASYN) IV 3 g (01/15/22 1234)     LOS: 15 days       Alma Friendly, MD [facility]   If 7PM-7AM, please contact night-coverage [email or web address]  01/15/2022, 4:28 PM

## 2022-01-15 NOTE — Progress Notes (Addendum)
Inpatient Rehabilitation Admissions Coordinator   I met with two sons and daughter to give tour of [facility] CIR facility. We discussed goals and expectations of a possible CIR admit. The family will discuss their preference for AIR venue and let me know by tomorrow am. I have asked OT to see patient today as last notes was 9/11. [employer] will need therapy notes within 48 hrs of request  to begin Auth. I will follow up tomorrow.  Danne Baxter, RN, MSN Rehab Admissions Coordinator [phone] 01/15/2022 2:25 PM

## 2022-01-16 DIAGNOSIS — I248 Other forms of acute ischemic heart disease: Secondary | ICD-10-CM

## 2022-01-16 DIAGNOSIS — J849 Interstitial pulmonary disease, unspecified: Secondary | ICD-10-CM | POA: Diagnosis not present

## 2022-01-16 DIAGNOSIS — R000 Tachycardia, unspecified: Secondary | ICD-10-CM

## 2022-01-16 DIAGNOSIS — E119 Type 2 diabetes mellitus without complications: Secondary | ICD-10-CM

## 2022-01-16 DIAGNOSIS — B348 Other viral infections of unspecified site: Secondary | ICD-10-CM

## 2022-01-16 DIAGNOSIS — D638 Anemia in other chronic diseases classified elsewhere: Secondary | ICD-10-CM

## 2022-01-16 DIAGNOSIS — I10 Essential (primary) hypertension: Secondary | ICD-10-CM

## 2022-01-16 DIAGNOSIS — R339 Retention of urine, unspecified: Secondary | ICD-10-CM

## 2022-01-16 DIAGNOSIS — J189 Pneumonia, unspecified organism: Secondary | ICD-10-CM | POA: Diagnosis not present

## 2022-01-16 DIAGNOSIS — J9601 Acute respiratory failure with hypoxia: Secondary | ICD-10-CM | POA: Diagnosis not present

## 2022-01-16 DIAGNOSIS — U071 COVID-19: Secondary | ICD-10-CM | POA: Diagnosis not present

## 2022-01-16 DIAGNOSIS — I4891 Unspecified atrial fibrillation: Secondary | ICD-10-CM

## 2022-01-16 LAB — COMPREHENSIVE METABOLIC PANEL
ALT: 115 U/L — ABNORMAL HIGH (ref 0–44)
AST: 36 U/L (ref 15–41)
Albumin: 2.7 g/dL — ABNORMAL LOW (ref 3.5–5.0)
Alkaline Phosphatase: 63 U/L (ref 38–126)
Anion gap: 9 (ref 5–15)
BUN: 13 mg/dL (ref 8–23)
CO2: 29 mmol/L (ref 22–32)
Calcium: 9.1 mg/dL (ref 8.9–10.3)
Chloride: 102 mmol/L (ref 98–111)
Creatinine, Ser: 0.89 mg/dL (ref 0.61–1.24)
GFR, Estimated: >60 mL/min (ref >60)
Glucose, Bld: 88 mg/dL (ref 70–99)
Potassium: 3.6 mmol/L (ref 3.5–5.1)
Sodium: 140 mmol/L (ref 135–145)
Total Bilirubin: 0.9 mg/dL (ref 0.3–1.2)
Total Protein: 5.1 g/dL — ABNORMAL LOW (ref 6.5–8.1)

## 2022-01-16 LAB — CBC WITH DIFFERENTIAL/PLATELET
Abs Immature Granulocytes: 0.06 10*3/uL (ref 0.00–0.07)
Basophils Absolute: 0 10*3/uL (ref 0.0–0.1)
Basophils Relative: 0 %
Eosinophils Absolute: 0.1 10*3/uL (ref 0.0–0.5)
Eosinophils Relative: 1 %
HCT: 33 % — ABNORMAL LOW (ref 39.0–52.0)
Hemoglobin: 11.4 g/dL — ABNORMAL LOW (ref 13.0–17.0)
Immature Granulocytes: 1 %
Lymphocytes Relative: 11 %
Lymphs Abs: 1.2 10*3/uL (ref 0.7–4.0)
MCH: 33 pg (ref 26.0–34.0)
MCHC: 34.5 g/dL (ref 30.0–36.0)
MCV: 95.7 fL (ref 80.0–100.0)
Monocytes Absolute: 0.8 10*3/uL (ref 0.1–1.0)
Monocytes Relative: 8 %
Neutro Abs: 8.8 10*3/uL — ABNORMAL HIGH (ref 1.7–7.7)
Neutrophils Relative %: 79 %
Platelets: 183 10*3/uL (ref 150–400)
RBC: 3.45 MIL/uL — ABNORMAL LOW (ref 4.22–5.81)
RDW: 12.8 % (ref 11.5–15.5)
WBC: 11 10*3/uL — ABNORMAL HIGH (ref 4.0–10.5)
nRBC: 0 % (ref 0.0–0.2)

## 2022-01-16 LAB — GLUCOSE, CAPILLARY
Glucose-Capillary: 95 mg/dL (ref 70–99)
Glucose-Capillary: 136 mg/dL — ABNORMAL HIGH (ref 70–99)
Glucose-Capillary: 294 mg/dL — ABNORMAL HIGH (ref 70–99)
Glucose-Capillary: 238 mg/dL — ABNORMAL HIGH (ref 70–99)

## 2022-01-16 LAB — MAGNESIUM: Magnesium: 1.9 mg/dL (ref 1.7–2.4)

## 2022-01-16 MED ORDER — K PHOS MONO-SOD PHOS DI & MONO 155-852-130 MG PO TABS
500.0000 mg | ORAL_TABLET | Freq: Three times a day (TID) | ORAL | Status: DC
  Administered 2022-01-16 – 2022-01-17 (×4): 500 mg via ORAL
  Filled 2022-01-16 (×4): qty 2

## 2022-01-16 NOTE — Telephone Encounter (Signed)
Called patient's son [name] but he did not answer. Left message for him to call back.

## 2022-01-16 NOTE — Telephone Encounter (Signed)
01/16/2022 03:51 PM EDT by Rosanne Gutting T 01/16/2022 03:51 PM EDT by Rosanne Gutting T  Incoming [place] ([place] [phone] (ZDGL)[phone] (Home) [phone] (Home)   - Communicated - pt son is calling back. son is wanting to know the relative risk of him catching covid again now that the pt is in the hospitals rehab. son states esbriet was also stopped while he was in the hospital throughout the 2 weeks and wants to know how to resume.    MR, please advise on this.

## 2022-01-16 NOTE — Progress Notes (Signed)
PROGRESS NOTE    Jeff Willis  NWG:[id number] DOB: Mar 24, 1941 DOA: 12/30/2021 PCP: Gaynelle Arabian, MD   Brief Narrative: 81 year old with past medical history significant for known esophageal stenosis status post dilation April 2023, IPF on Esbriet (FVC 84% and DLCO 45 % May 2023) patient flew to [place] for a cruise, on 12/24/2021 developed fever cough.  On 12/26/2021 on the cruise he was diagnosed with COVID-19 infection, declined Paxlovid at that time, due to previously experienced side effects from it.  Continue to have cough, failure to thrive, returned to [place] via flight 12/30/2021.  Pulse ox was checked and was found to be hypoxic presented via EMS.  COVID PCR positive.  CT chest with contrast negative for PE, CTA chest neg for PE. Admitted to the hospital 12/30/2021 with acute hypoxic respiratory failure secondary to COVID-19. On 9/5 was intubated due to respiratory failure and extubated on 9/8. TRH assumed care.    Assessment & Plan:   Principal Problem:   Acute respiratory failure with hypoxia (HCC) Active Problems:   Interstitial lung disease ([place])   COVID-19 virus infection   Hyponatremia   History of sinus tachycardia   Pneumonia due to COVID-19 virus   Protein-calorie malnutrition, severe   Pressure injury of skin  Acute Hypoxic respiratory failure IPF flare in the setting of COVID-19 and rhinovirus Possible aspiration pneumonia on 01/10/2022- now resolved Now on 2-4 L of O2 Patient subsequently 9/5 developed worsening respiratory failure requiring intubation, extubated on 9/8 On 9/14 pt requiring about 20 L of high flow nasal cannula on 9/14, PCCM reconsulted, appreciate recs CTA chest negative for PE Completed tapered dose of dexamethasone on 01/10/2022, started  Solu-Medrol on 9/14 as per PCCM, switched to prednisone long taper as per PCCM.  Bactrim 3 times per week as prophylaxis, resume home Esbriet Completed 5 days antibiotics, started Unasyn on 01/10/2022,  plan for 5 days as per PCCM Continue with flutter valve, PRN Mucinex.  Patient needs to be on airborne precaution for covid for 21 days from 8/30 (Covid test perform at the cruise) . Off isolation starting 9/21 Patient accepted to [facility] inpatient rehab, but would prefer CIR due to proximity  Acute urinary retention -Required in and out multiple times, urology input greatly appreciated, continue with Flomax, voiding trial in 1 week while in CIR, otherwise urology will arrange for follow-up in 2 weeks.   Tachycardia/A-fib Hypertension Demand ischemia Continue with metoprolol PRN hydralazine   Diabetes mellitus type 2 SSI, Accu-Cheks, hypoglycemic protocol  Known esophageal dilation April 2023 History of dysphagia for solid and spices PPI-- change to BID.  MBS: No evidence of aspiration,  A 13 mm pill  lodged briefly in the vallecular space, then above the UES.  It passed through the UES into the cervical esophagus, then returned to the hypopharynx.  A pureed bolus helped pass it through the esophagus without incident/  Patient to follow regular diet but to avoid large bolus of food, hard meat, should eat soft food  Anemia of chronic disease Hb stable.   Severe protein caloric malnutrition due to acute illness Failure to thrive due to acute illness, weight loss from IPF -BSM: Diet advanced to regular, soft, avoid meat, bread or large amount of bolus.  -Nutritionist consulted.   Back pain: acute on chronic. Spondylosis  Started low dose gabapentin 100 mg TID 9/10. Continue with current dose. Pain is getting better, unable to tolerate high dose. Continue with oxycodone PRN MRI Lumbar Spine: 1. L5-S1 right paracentral  disc extrusion with caudal migration, new from the prior exam. Neurosurgery consulted, they think MRI finding consistent with degenerative spondylosis throughout spine. No need for acute intervention.  Blood culture: no growth to date/ Blood culture from admission  negative. Recent ESR normal, CRP normal. Inflammatory marker initially elevate on admission likely related to covid.   Elevated D dimer; in setting of covid Doppler LE negative for DVT On Lovenox. Level decreasing.   See wound care documentation below.  Pressure Injury 01/05/22 Buttocks Right Stage 2 -  Partial thickness loss of dermis presenting as a shallow open injury with a red, pink wound bed without slough. (Active)  01/05/22 2130  Location: Buttocks  Location Orientation: Right  Staging: Stage 2 -  Partial thickness loss of dermis presenting as a shallow open injury with a red, pink wound bed without slough.  Wound Description (Comments):   Present on Admission:   Dressing Type Foam - Lift dressing to assess site every shift 01/16/22 0800     Nutrition Problem: Severe Malnutrition Etiology: chronic illness    Signs/Symptoms: severe muscle depletion, severe fat depletion    Interventions: Ensure Enlive (each supplement provides 350kcal and 20 grams of protein), MVI, Liberalize Diet  Estimated body mass index is 20.92 kg/m as calculated from the following:   Height as of this encounter: $RemoveBeforeD'5\' 6"'[id number]$  (1.676 m).   Weight as of this encounter: 58.8 kg.   DVT prophylaxis: Lovenox Code Status: Full code Family Communication: Son at bedside Disposition Plan:  Status is: Inpatient Remains inpatient appropriate because: management resp failure    Consultants:  PCCM  Procedures:  Mechanical ventilation  Antimicrobials:  Unasyn    Subjective: No significant events overnight, he denies any complaints today, required Foley catheter insertion for urinary retention    Objective: Vitals:   01/15/22 1946 01/16/22 0700 01/16/22 0835 01/16/22 1244  BP: 135/78 121/67 127/76 127/68  Pulse: 91 93 81 86  Resp: (!) 26 (!) 29 (!) 25 (!) 21  Temp: 97.9 F (36.6 C) 97.9 F (36.6 C) 97.7 F (36.5 C) 98.3 F (36.8 C)  TempSrc:   Oral Oral  SpO2: 96% 96% 96% 98%  Weight:       Height:        Intake/Output Summary (Last 24 hours) at 01/16/2022 1539 Last data filed at 01/16/2022 1330 Gross per 24 hour  Intake --  Output 3475 ml  Net -3475 ml   Filed Weights   01/12/22 0500 01/13/22 0500 01/15/22 0500  Weight: 52 kg 55.9 kg 58.8 kg    Examination: Awake Alert, Oriented X 3, No new F.N deficits, Normal affect Symmetrical Chest wall movement, Good air movement bilaterally, CTAB RRR,No Gallops,Rubs or new Murmurs, No Parasternal Heave +ve B.Sounds, Abd Soft, No tenderness, No rebound - guarding or rigidity. No Cyanosis, Clubbing or edema, No new Rash or bruise      Data Reviewed: I have personally reviewed following labs and imaging studies  CBC: Recent Labs  Lab 01/12/22 0727 01/13/22 0412 01/14/22 0341 01/15/22 0244 01/16/22 0601  WBC 17.4* 13.9* 11.9* 10.8* 11.0*  NEUTROABS 14.8* 11.8* 10.0* 8.8* 8.8*  HGB 11.0* 11.0* 10.6* 10.8* 11.4*  HCT 31.7* 31.9* 31.6* 31.6* 33.0*  MCV 95.5 95.8 95.8 96.3 95.7  PLT 182 186 199 185 161   Basic Metabolic Panel: Recent Labs  Lab 01/11/22 0532 01/12/22 0727 01/13/22 0412 01/14/22 0341 01/15/22 0244 01/16/22 0601  NA 135 135 138 135 136 140  K 4.1 4.3 4.1 3.6 3.9 3.6  CL 101 99 103 100 102 102  CO2 $Re'27 26 31 29 29 29  'QsB$ GLUCOSE 121* 78 81 105* 110* 88  BUN 30* 28* $Remov'23 20 18 13  'iBzJuO$ CREATININE 1.00 0.89 0.95 0.87 0.85 0.89  CALCIUM 8.5* 8.4* 8.5* 8.3* 8.6* 9.1  MG 1.9 1.9 2.1 2.1 2.0 1.9  PHOS 4.5 2.3* 2.7 2.4* 2.4*  --    GFR: Estimated Creatinine Clearance: 55.1 mL/min (by C-G formula based on SCr of 0.89 mg/dL). Liver Function Tests: Recent Labs  Lab 01/15/22 0244 01/16/22 0601  AST 40 36  ALT 109* 115*  ALKPHOS 54 63  BILITOT 0.6 0.9  PROT 4.8* 5.1*  ALBUMIN 2.6* 2.7*    No results for input(s): "LIPASE", "AMYLASE" in the last 168 hours. No results for input(s): "AMMONIA" in the last 168 hours. Coagulation Profile: No results for input(s): "INR", "PROTIME" in the last 168  hours.  Cardiac Enzymes: No results for input(s): "CKTOTAL", "CKMB", "CKMBINDEX", "TROPONINI" in the last 168 hours. BNP (last 3 results) Recent Labs    09/07/21 1218  PROBNP 211.0*   HbA1C: No results for input(s): "HGBA1C" in the last 72 hours. CBG: Recent Labs  Lab 01/15/22 1124 01/15/22 1633 01/15/22 2156 01/16/22 0833 01/16/22 1243  GLUCAP 87 231* 256* 95 136*   Lipid Profile: No results for input(s): "CHOL", "HDL", "LDLCALC", "TRIG", "CHOLHDL", "LDLDIRECT" in the last 72 hours.  Thyroid Function Tests: No results for input(s): "TSH", "T4TOTAL", "FREET4", "T3FREE", "THYROIDAB" in the last 72 hours. Anemia Panel: No results for input(s): "VITAMINB12", "FOLATE", "FERRITIN", "TIBC", "IRON", "RETICCTPCT" in the last 72 hours.  Sepsis Labs: No results for input(s): "PROCALCITON", "LATICACIDVEN" in the last 168 hours.  Recent Results (from the past 240 hour(s))  Culture, blood (Routine X 2) w Reflex to ID Panel     Status: None   Collection Time: 01/07/22  9:08 AM   Specimen: BLOOD LEFT ARM  Result Value Ref Range Status   Specimen Description BLOOD LEFT ARM  Final   Special Requests   Final    BOTTLES DRAWN AEROBIC AND ANAEROBIC Blood Culture adequate volume   Culture   Final    NO GROWTH 5 DAYS Performed at [facility], [address], [place], [place] [address]    Report Status 01/12/2022 FINAL  Final  Culture, blood (Routine X 2) w Reflex to ID Panel     Status: None   Collection Time: 01/07/22  9:08 AM   Specimen: BLOOD LEFT HAND  Result Value Ref Range Status   Specimen Description BLOOD LEFT HAND  Final   Special Requests   Final    BOTTLES DRAWN AEROBIC AND ANAEROBIC Blood Culture adequate volume   Culture   Final    NO GROWTH 5 DAYS Performed at [facility], [place] [address], [place], [place] [address]    Report Status 01/12/2022 FINAL  Final         Radiology Studies: No results found.      Scheduled Meds:  aspirin  81 mg  Oral Daily   enoxaparin (LOVENOX) injection  40 mg Subcutaneous Q24H   feeding supplement  237 mL Oral BID BM   gabapentin  100 mg Oral TID   insulin aspart  0-6 Units Subcutaneous TID WC   insulin detemir  6 Units Subcutaneous Daily   lidocaine  1 patch Transdermal Q24H   metoprolol tartrate  12.5 mg Oral BID   multivitamin with minerals  1 tablet Oral Daily  NON FORMULARY 534 mg  534 mg Oral TID   pantoprazole  40 mg Oral BID   phosphorus  500 mg Oral TID   polyethylene glycol  17 g Oral BID   predniSONE  40 mg Oral Q breakfast   Followed by   Derrill Memo ON 01/29/2022] predniSONE  30 mg Oral Q breakfast   Followed by   Derrill Memo ON 02/12/2022] predniSONE  20 mg Oral Q breakfast   Followed by   Derrill Memo ON 02/26/2022] predniSONE  10 mg Oral Q breakfast   Followed by   Derrill Memo ON 03/12/2022] predniSONE  5 mg Oral Q breakfast   rosuvastatin  10 mg Oral QHS   senna  2 tablet Oral Daily   [START ON 01/18/2022] sulfamethoxazole-trimethoprim  1 tablet Oral Once per day on Mon Wed Fri   tamsulosin  0.4 mg Oral QPC supper   Continuous Infusions:  sodium chloride Stopped (01/01/22 0609)     LOS: 16 days       Phillips Climes, MD [facility]   If 7PM-7AM, please contact night-coverage [email or web address]  01/16/2022, 3:39 PM

## 2022-01-16 NOTE — Progress Notes (Addendum)
Physical Therapy Treatment Patient Details Name: Jeff Willis MRN: [id number] DOB: 1940/08/26 Today's Date: 01/16/2022   History of Present Illness Pt is an 81 y.o. male admitted 12/30/21 with hypoxia and failure to thrive after returning home from flight to [place] on same day; of note, pt with recent COVID+ test 12/26/21 while on cruise. Workup for IPF flare secondary to COVID and rhinovirus. ETT 9/5-9/8. PMH includes esophageal stenosis, PAD, GERD, spinal stenosis.    PT Comments    Pt received in chair, pleasantly agreeable to therapy session and c/o moderate fatigue after sitting up in chair 4 hours. Pt with good effort for transfers and gait training this date and with improved tolerance for exertion, pt desat on 2L O2 [place] and with 4L/min O2 maintains 89-93% (mostly 90% and above with standing rest breaks when decreases to 90%). Pt reports compliance with IS and encouraged to continue this as well as sitting up in chair frequently throughout the day for pulmonary clearance and endurance building. Pt needing up to minA for transfers and gait with mod safety cues and cueing for activity pacing. Anticipate pt will tolerate 3 hours/day of therapies now that endurance and activity tolerance are progressing. Plan to initiate stair training next session in room or hallway. Pt continues to benefit from PT services to progress toward functional mobility goals.    Recommendations for follow up therapy are one component of a multi-disciplinary discharge planning process, led by the attending physician.  Recommendations may be updated based on patient status, additional functional criteria and insurance authorization.  Follow Up Recommendations  Acute inpatient rehab (3hours/day)     Assistance Recommended at Discharge Frequent or constant Supervision/Assistance  Patient can return home with the following A little help with walking and/or transfers;A little help with  bathing/dressing/bathroom;Assistance with cooking/housework;Assist for transportation;Help with stairs or ramp for entrance   Equipment Recommendations  Rolling walker (2 wheels);BSC/3in1    Recommendations for Other Services       Precautions / Restrictions Precautions Precautions: Fall Precaution Comments: Watch SpO2 and HR Restrictions Weight Bearing Restrictions: No     Mobility  Bed Mobility Overal bed mobility: Needs Assistance Bed Mobility: Sit to Supine       Sit to supine: Min guard   General bed mobility comments: needing extra time, pt using bed rail.    Transfers Overall transfer level: Needs assistance Equipment used: Rolling walker (2 wheels) Transfers: Sit to/from Stand Sit to Stand: Min guard           General transfer comment: increased time to perform, cues for safe UE placement to push from arm rests and to reach back when sitting EOB    Ambulation/Gait Ambulation/Gait assistance: Min assist Gait Distance (Feet): 55 Feet Assistive device: Rolling walker (2 wheels) Gait Pattern/deviations: Step-to pattern, Narrow base of support, Step-through pattern, Decreased dorsiflexion - right, Decreased dorsiflexion - left Gait velocity: reduced Gait velocity interpretation: <1.31 ft/sec, indicative of household ambulator   General Gait Details: cues for improved step length and heel strike with fair carryover. pt with increased WOB with exertion needing min cues for standing breaks and pursed-lip breathing. SpO2 intermittently desat to 89% on 4L O2 [place] but improves with standing rest break to 92% and greater.   Stairs Stairs:  (pt able to march in place with high knees, plan to attempt next session)            Balance Overall balance assessment: Needs assistance Sitting-balance support: Feet supported Sitting balance-Leahy Scale: Good  Standing balance support: Reliant on assistive device for balance Standing balance-Leahy Scale:  Poor Standing balance comment: reliant on AD and or external support                            Cognition Arousal/Alertness: Awake/alert Behavior During Therapy: Flat affect Overall Cognitive Status: Within Functional Limits for tasks assessed                                 General Comments: Pleasant and cooperative        Exercises Other Exercises Other Exercises: standing hamstring curls and hip flexion x5 reps ea    General Comments General comments (skin integrity, edema, etc.): SpO2 WFL resting on 2L O2 [place], desat with 2L with transfers so increased to 4L and maintains 89-93% on 4L with transfers and gait. HR WFL.      Pertinent Vitals/Pain Pain Assessment Pain Assessment: No/denies pain Pain Intervention(s): Monitored during session, Repositioned           PT Goals (current goals can now be found in the care plan section) Acute Rehab PT Goals Patient Stated Goal: to go home PT Goal Formulation: With patient Time For Goal Achievement: 01/19/22 Progress towards PT goals: Progressing toward goals    Frequency    Min 3X/week      PT Plan Current plan remains appropriate       AM-PAC PT "6 Clicks" Mobility   Outcome Measure  Help needed turning from your back to your side while in a flat bed without using bedrails?: A Little Help needed moving from lying on your back to sitting on the side of a flat bed without using bedrails?: A Little Help needed moving to and from a bed to a chair (including a wheelchair)?: A Little Help needed standing up from a chair using your arms (e.g., wheelchair or bedside chair)?: A Lot (mod cues with each transfer for safety) Help needed to walk in hospital room?: A Lot (mod safety cues) Help needed climbing 3-5 steps with a railing? : A Lot 6 Click Score: 15    End of Session Equipment Utilized During Treatment: Gait belt;Oxygen Activity Tolerance: Patient tolerated treatment well Patient left: in  bed;with call bell/phone within reach;with bed alarm set;with family/visitor present (heels floated; spouse and daughter in law present) Nurse Communication: Mobility status PT Visit Diagnosis: Unsteadiness on feet (R26.81);Muscle weakness (generalized) (M62.81)     Time: 1443-1540 PT Time Calculation (min) (ACUTE ONLY): 24 min  Charges:  $Gait Training: 8-22 mins $Therapeutic Activity: 8-22 mins                     Sharonann Malbrough P., PTA Acute Rehabilitation Services Secure Chat Preferred 9a-5:30pm Office: [place] 01/16/2022, 5:33 PM

## 2022-01-16 NOTE — Progress Notes (Signed)
Occupational Therapy Treatment Patient Details Name: Jeff Willis MRN: [id number] DOB: [birth year] Today's Date: 01/16/2022   History of present illness Pt is an 81 y.o. male admitted 12/30/21 with hypoxia and failure to thrive after returning home from flight to [place] on same day; of note, pt with recent COVID+ test 12/26/21 while on cruise. Workup for IPF flare secondary to COVID and rhinovirus. ETT 9/5-9/8. PMH includes esophageal stenosis, PAD, GERD, spinal stenosis.   OT comments  Patient with improved activity tolerance, and no pain expressed.  Patient able to complete step pivot transfers with Min A and RW, and able to perform loser body ADL with setup and Min Guard once standing.  Patient continues to desaturate with activity, but rebounds nicely with rest breaks.  OT continues to recommend AIR, as patient can tolerate 3+ hours of intensive rehab to assist him return to his baseline independent level.     Recommendations for follow up therapy are one component of a multi-disciplinary discharge planning process, led by the attending physician.  Recommendations may be updated based on patient status, additional functional criteria and insurance authorization.    Follow Up Recommendations  Acute inpatient rehab (3hours/day)    Assistance Recommended at Discharge Frequent or constant Supervision/Assistance  Patient can return home with the following  A lot of help with bathing/dressing/bathroom;Help with stairs or ramp for entrance;Assist for transportation;Assistance with cooking/housework;A lot of help with walking and/or transfers   Equipment Recommendations  None recommended by OT    Recommendations for Other Services      Precautions / Restrictions Precautions Precautions: Fall Precaution Comments: Watch SpO2 and HR Restrictions Weight Bearing Restrictions: No       Mobility Bed Mobility                 Patient Response: Cooperative  Transfers Overall  transfer level: Needs assistance Equipment used: Rolling walker (2 wheels) Transfers: Sit to/from Stand Sit to Stand: Min guard     Step pivot transfers: Min assist, From elevated surface           Balance Overall balance assessment: Needs assistance Sitting-balance support: Feet supported Sitting balance-Leahy Scale: Good     Standing balance support: Reliant on assistive device for balance Standing balance-Leahy Scale: Poor                             ADL either performed or assessed with clinical judgement   ADL   Eating/Feeding: Independent;Sitting   Grooming: Wash/dry hands;Wash/dry face;Sitting;Set up   Upper Body Bathing: Minimal assistance;Sitting   Lower Body Bathing: Minimal assistance;Sit to/from stand   Upper Body Dressing : Minimal assistance;Sitting   Lower Body Dressing: Minimal assistance;Sit to/from stand   Toilet Transfer: Minimal assistance;BSC/3in1;Stand-pivot;Rolling walker (2 wheels)   Toileting- Clothing Manipulation and Hygiene: Moderate assistance;Sit to/from stand              Extremity/Trunk Assessment Upper Extremity Assessment Upper Extremity Assessment: Generalized weakness   Lower Extremity Assessment Lower Extremity Assessment: Defer to PT evaluation   Cervical / Trunk Assessment Cervical / Trunk Assessment: Normal    Vision       Perception     Praxis      Cognition Arousal/Alertness: Awake/alert Behavior During Therapy: Flat affect Overall Cognitive Status: Within Functional Limits for tasks assessed  Pertinent Vitals/ Pain       Pain Assessment Pain Assessment: No/denies pain  Home Living Family/patient expects to be discharged to:: Private residence Living Arrangements: Children;Spouse/significant other;Other relatives Available Help at Discharge: Family;Available 24 hours/day Type of Home: House Home Access:  Stairs to enter [employer] of Steps: 2 Entrance Stairs-Rails: None Home Layout: Two level;Able to live on main level with bedroom/bathroom                          Prior Functioning/Environment              Frequency  Min 2X/week        Progress Toward Goals  OT Goals(current goals can now be found in the care plan section)  Progress towards OT goals: Progressing toward goals  Acute Rehab OT Goals OT Goal Formulation: With patient Time For Goal Achievement: 01/30/22 Potential to Achieve Goals: Good ADL Goals Pt Will Perform Grooming: with set-up;standing Pt Will Perform Upper Body Dressing: with set-up;sitting Pt Will Perform Lower Body Dressing: with min guard assist;sit to/from stand Pt Will Transfer to Toilet: ambulating;regular height toilet;with min guard assist Pt Will Perform Toileting - Clothing Manipulation and hygiene: with min assist;sit to/from stand  Plan Discharge plan remains appropriate    Co-evaluation                 AM-PAC OT "6 Clicks" Daily Activity     Outcome Measure   Help from another person eating meals?: None Help from another person taking care of personal grooming?: None Help from another person toileting, which includes using toliet, bedpan, or urinal?: A Lot Help from another person bathing (including washing, rinsing, drying)?: A Little Help from another person to put on and taking off regular upper body clothing?: A Little Help from another person to put on and taking off regular lower body clothing?: A Little 6 Click Score: 19    End of Session Equipment Utilized During Treatment: Rolling walker (2 wheels)  OT Visit Diagnosis: Unsteadiness on feet (R26.81);Muscle weakness (generalized) (M62.81)   Activity Tolerance Patient tolerated treatment well   Patient Left in chair;with call bell/phone within reach;with nursing/sitter in room;with family/visitor present   Nurse Communication Mobility  status        Time: 5537-4827 OT Time Calculation (min): 25 min  Charges: OT General Charges $OT Visit: 1 Visit OT Treatments $Self Care/Home Management : 23-37 mins  01/16/2022  RP, OTR/L  Acute Rehabilitation Services  Office:  [phone]   Metta Clines 01/16/2022, 10:52 AM

## 2022-01-16 NOTE — Progress Notes (Signed)
Inpatient Rehabilitation Admissions Coordinator   I met with son, Ulice Dash. Family prefer [facility] for his rehab at this time. I have notified acute team, and TOC. I await updated therapy notes today to begin Auth with [facility] for a possible CIR admit.  I will begin Auth with [facility] today once clinicals available.   Danne Baxter, RN, MSN Rehab Admissions Coordinator [phone] 01/16/2022 10:26 AM

## 2022-01-16 NOTE — Telephone Encounter (Signed)
I spoke to Dr Linus Salmons in ICU after his 2nd trip to ICU . Chart says he is now on 2-4L [place] in floor. IS there anything at t his point [name] will want to discuss. I am glad he is better e. Looking forward to him ganing strenght and continued healing

## 2022-01-16 NOTE — PMR Pre-admission (Signed)
PMR Admission Coordinator Pre-Admission Assessment  Patient: Jeff Willis is an 81 y.o., male MRN: [id number] DOB: [birth year] Height: $RemoveBeforeDE'5\' 6"'cBxjPPCmgxHsejt$  (167.6 cm) Weight: 54.7 kg  Insurance Information HMO:     PPO:      PCP:      IPA:      80/20:      OTHER:  PRIMARY: Blue Medicare      Policy#: [id number]      Subscriber: pt CM Name: [name]      Phone#: [phone]     Fax#: [phone] Pre-Cert#: [id number]      Employer:  Benefits:  Phone #: [phone]     Name: 9/20 Eff. Date: 04/29/21     Deduct: none      Out of Pocket Max: $3950      Life Max: none CIR: $335 co pay per day days 1 until 5      SNF: no co pay days 1 until 20; $196 co pay per day days 21 until 60; no c pp[ay days 61 until 100 Outpatient: $10 per visit     Co-Pay: visits per medical neccesity Home Health: 100%      Co-Pay: visits per medical neccesity DME: 80%     Co-Pay: 20% Providers: in network  SECONDARY: none        Financial Counselor:       Phone#:   The Engineer, petroleum" for patients in Inpatient Rehabilitation Facilities with attached "Privacy Act [place] Records" was provided and verbally reviewed with: Patient and Family  Emergency Contact Information Contact Information     Name Relation Home Work [place] Son [phone]  [phone]   Ernan, Runkles   [phone]   [name]   [phone]   [name] Daughter   [phone]   Lorris, Carducci [phone]        Current Medical History  Patient Admitting Diagnosis: Acute respiratory failure due to COVID  History of Present Illness: 81 year old male with history significant for Idiopathic pulmonary fibrosis on [place]riet on room air, esophageal stenosis s/p dilation , essential tremor, neuropathy, HLD and prostate Cancer.. Recent cruise to [place]. On 12/24/21 developed fever and cough and was diagnosed with COVID on cruise. [facility] on 12[facility]30/21 and presented to  emergency room, due to hypoxemia. CT chest showed left greater than right diffuse groundglass opacities. Admitted for acute hypoxemic respiratory failure.   On 9/5 developer worsening respiratory failure requiring intubation. Eventually extubated 9/8. PCCM consulted to manage. On 9/14 again requiring 20 liters HFNC. CTA chest negative for PE. Completed dose of dexamethasone and started solumedrol on 9/14. Switched to prednisone long taper and Bactrim as prophylaxis. Resumed home Esbriet. Completed 5 days Unasyn. To continue use of flutter valve and prn Mucinex. Was on airborne precautions for COVID for 21 days until 9/21. On 2 liters [place] O2.  Developed urinary retention and Urology consulted. S/p I and O cath with 1100 cc retention. Recommended insert Foley and attempt voiding trial once at AIR level rehab. Start Flomax. Metoprolol for tachycardia and a fib. Prn hydralazine for HTN.   MBS with no evidence of aspiration. SLP recommended regular diet but to avoid large bolus of food, hard meat , and should eat soft food. History of dysphagia for solid and spices with known esophageal dilation April 2023. Dietician consulted due to severe protein caloric malnutrition due to acute illness.   Low dose gabapentin for acute on chronic spondylosis with back pain. On  lovenox for DVT prophylaxis.   Patient's medical record from [facility] has been reviewed by the rehabilitation admission coordinator and physician.  Past Medical History  Past Medical History:  Diagnosis Date   Back pain    Elevated PSA    Essential tremor    Hypercholesteremia    Neuropathy    Prostate cancer ([place])    Sinus tachycardia 01/30/2018   Has the patient had major surgery during 100 days prior to admission? No  Family History   family history includes Other in his father and mother.  Current Medications  Current Facility-Administered Medications:    0.9 %  sodium chloride infusion, , Intravenous, PRN, Brand Males, MD, Paused at 01/01/22 0609   acetaminophen (TYLENOL) tablet 650 mg, 650 mg, Oral, Q6H PRN, Etta Quill, DO, 650 mg at 01/11/22 1502   aspirin chewable tablet 81 mg, 81 mg, Oral, Daily, Margaretha Seeds, MD, 81 mg at 01/17/22 1144   Chlorhexidine Gluconate Cloth 2 % PADS 6 each, 6 each, Topical, Daily, Elgergawy, Silver Huguenin, MD, 6 each at 01/17/22 1247   cyclobenzaprine (FLEXERIL) tablet 5 mg, 5 mg, Oral, BID PRN, Margaretha Seeds, MD, 5 mg at 01/07/22 2227   enoxaparin (LOVENOX) injection 40 mg, 40 mg, Subcutaneous, Q24H, Jagadish, Mayuri, MD, 40 mg at 01/17/22 1145   feeding supplement (ENSURE ENLIVE / ENSURE PLUS) liquid 237 mL, 237 mL, Oral, BID BM, Regalado, Belkys A, MD, 237 mL at 01/16/22 2154   gabapentin (NEURONTIN) 250 MG/5ML solution 100 mg, 100 mg, Oral, TID, Regalado, Belkys A, MD, 100 mg at 01/17/22 1144   guaiFENesin (ROBITUSSIN) 100 MG/5ML liquid 5 mL, 5 mL, Oral, Q4H PRN, Regalado, Belkys A, MD, 5 mL at 01/17/22 1247   hydrALAZINE (APRESOLINE) injection 10 mg, 10 mg, Intravenous, Q6H PRN, Regalado, Belkys A, MD   insulin aspart (novoLOG) injection 0-6 Units, 0-6 Units, Subcutaneous, TID WC, Alma Friendly, MD, 3 Units at 01/16/22 1737   insulin detemir (LEVEMIR) injection 6 Units, 6 Units, Subcutaneous, Daily, Alma Friendly, MD, 6 Units at 01/17/22 1145   ipratropium-albuterol (DUONEB) 0.5-2.5 (3) MG/3ML nebulizer solution 3 mL, 3 mL, Nebulization, Q6H PRN, Mannam, Praveen, MD, 3 mL at 01/04/22 1625   labetalol (NORMODYNE) injection 5 mg, 5 mg, Intravenous, Q6H PRN, Margaretha Seeds, MD, 5 mg at 01/04/22 1835   lidocaine (LIDODERM) 5 % 1 patch, 1 patch, Transdermal, Q24H, Katsadouros, Vasilios, MD, 1 patch at 01/16/22 1738   metoprolol tartrate (LOPRESSOR) tablet 12.5 mg, 12.5 mg, Oral, BID, Margaretha Seeds, MD, 12.5 mg at 01/17/22 1144   multivitamin with minerals tablet 1 tablet, 1 tablet, Oral, Daily, Alma Friendly, MD, 1 tablet at 01/17/22  1142   ondansetron (ZOFRAN) tablet 4 mg, 4 mg, Oral, Q6H PRN **OR** ondansetron (ZOFRAN) injection 4 mg, 4 mg, Intravenous, Q6H PRN, Etta Quill, DO   Oral care mouth rinse, 15 mL, Mouth Rinse, PRN, Margaretha Seeds, MD   oxyCODONE (Oxy IR/ROXICODONE) immediate release tablet 5 mg, 5 mg, Oral, Q4H PRN, Regalado, Belkys A, MD, 5 mg at 01/07/22 2225   pantoprazole (PROTONIX) EC tablet 40 mg, 40 mg, Oral, BID, Regalado, Belkys A, MD, 40 mg at 01/17/22 1143   Pirfenidone CAPS 267 mg, 267 mg, Oral, 3 times per day, Elgergawy, Silver Huguenin, MD   polyethylene glycol (MIRALAX / GLYCOLAX) packet 17 g, 17 g, Oral, BID, Gerrit Heck, MD, 17 g at 01/14/22 2146   predniSONE (DELTASONE) tablet 40 mg, 40  mg, Oral, Q breakfast, 40 mg at 01/17/22 1143 **FOLLOWED BY** [START ON 01/29/2022] predniSONE (DELTASONE) tablet 30 mg, 30 mg, Oral, Q breakfast **FOLLOWED BY** [START ON 02/12/2022] predniSONE (DELTASONE) tablet 20 mg, 20 mg, Oral, Q breakfast **FOLLOWED BY** [START ON 02/26/2022] predniSONE (DELTASONE) tablet 10 mg, 10 mg, Oral, Q breakfast **FOLLOWED BY** [START ON 03/12/2022] predniSONE (DELTASONE) tablet 5 mg, 5 mg, Oral, Q breakfast, Groce, Sarah F, NP   rosuvastatin (CRESTOR) tablet 10 mg, 10 mg, Oral, QHS, Margaretha Seeds, MD, 10 mg at 01/16/22 2151   senna (SENOKOT) tablet 17.2 mg, 2 tablet, Oral, Daily, Margaretha Seeds, MD, 17.2 mg at 01/13/22 1014   sodium chloride (OCEAN) 0.65 % nasal spray 1 spray, 1 spray, Each Nare, PRN, Anders Simmonds, MD, 1 spray at 01/10/22 2158   [START ON 01/18/2022] sulfamethoxazole-trimethoprim (BACTRIM DS) 800-160 MG per tablet 1 tablet, 1 tablet, Oral, Once per day on Mon Wed Fri, Ezenduka, Nkeiruka J, MD   tamsulosin [facility]) capsule 0.4 mg, 0.4 mg, Oral, QPC supper, Alma Friendly, MD, 0.4 mg at 01/16/22 1736  Patients Current Diet:  Diet Order             Diet regular Room service appropriate? Yes; Fluid consistency: Thin  Diet effective now                   Precautions / Restrictions Precautions Precautions: Fall Precaution Comments: Watch SpO2 and HR Restrictions Weight Bearing Restrictions: No   Has the patient had 2 or more falls or a fall with injury in the past year? No  Prior Activity Level Community (5-7x/wk): independent  Prior Functional Level Self Care: Did the patient need help bathing, dressing, using the toilet or eating? Independent  Indoor Mobility: Did the patient need assistance with walking from room to room (with or without device)? Independent  Stairs: Did the patient need assistance with internal or external stairs (with or without device)? Independent  Functional Cognition: Did the patient need help planning regular tasks such as shopping or remembering to take medications? Independent  Patient Information Are you of Hispanic, Latino/a,or Spanish origin?: A. No, not of Hispanic, Latino/a, or Spanish origin What is your race?: D. Asian [place] Do you need or want an interpreter to communicate with a doctor or health care staff?: 0. No  Patient's Response To:  Health Literacy and Transportation Is the patient able to respond to health literacy and transportation needs?: Yes Health Literacy - How often do you need to have someone help you when you read instructions, pamphlets, or other written material from your doctor or pharmacy?: Never In the past 12 months, has lack of transportation kept you from medical appointments or from getting medications?: No In the past 12 months, has lack of transportation kept you from meetings, work, or from getting things needed for daily living?: No  Development worker, international aid / [place] Devices/Equipment: None Home Equipment: Grab bars - tub/shower  Prior Device Use: Indicate devices/aids used by the patient prior to current illness, exacerbation or injury? None of the above  Current Functional Level Cognition  Overall Cognitive Status: Within  Functional Limits for tasks assessed Orientation Level: Oriented X4 General Comments: Pleasant and cooperative    Extremity Assessment (includes Sensation/Coordination)  Upper Extremity Assessment: Generalized weakness  Lower Extremity Assessment: Defer to PT evaluation    ADLs  Overall ADL's : Needs assistance/impaired Eating/Feeding: Independent, Sitting Grooming: Wash/dry hands, Wash/dry face, Sitting, Set up Upper Body Bathing: Minimal  assistance, Sitting Lower Body Bathing: Minimal assistance, Sit to/from stand Upper Body Dressing : Minimal assistance, Sitting Lower Body Dressing: Minimal assistance, Sit to/from stand Toilet Transfer: Minimal assistance, BSC/3in1, Stand-pivot, Rolling walker (2 wheels) Toileting- Clothing Manipulation and Hygiene: Moderate assistance, Sit to/from stand Functional mobility during ADLs: Minimal assistance, Cueing for safety, Cueing for sequencing, Rolling walker (2 wheels) General ADL Comments: Patient progressing towards goals, patient limited by fatigue and decreased O2 with activity    Mobility  Overal bed mobility: Needs Assistance Bed Mobility: Sit to Supine Rolling: Modified independent (Device/Increase time) Sidelying to sit: Min guard (HOB flat) Sit to supine: Min guard Sit to sidelying: Min assist General bed mobility comments: needing extra time, pt using bed rail.    Transfers  Overall transfer level: Needs assistance Equipment used: Rolling walker (2 wheels) Transfers: Sit to/from Stand Sit to Stand: Min guard Bed to/from chair/wheelchair/BSC transfer type:: Step pivot Step pivot transfers: Min assist, From elevated surface General transfer comment: increased time to perform, cues for safe UE placement to push from arm rests and to reach back when sitting EOB    Ambulation / Gait / Stairs / Wheelchair Mobility  Ambulation/Gait Ambulation/Gait assistance: Herbalist (Feet): 55 Feet Assistive device: Rolling  walker (2 wheels) Gait Pattern/deviations: Step-to pattern, Narrow base of support, Step-through pattern, Decreased dorsiflexion - right, Decreased dorsiflexion - left General Gait Details: cues for improved step length and heel strike with fair carryover. pt with increased WOB with exertion needing min cues for standing breaks and pursed-lip breathing. SpO2 intermittently desat to 89% on 4L O2 [place] but improves with standing rest break to 92% and greater. Gait velocity: reduced Gait velocity interpretation: <1.31 ft/sec, indicative of household ambulator Pre-gait activities: standing hip flexion x10 reps Stairs:  (pt able to march in place with high knees, plan to attempt next session)    Posture / Balance Dynamic Sitting Balance Sitting balance - Comments: able to don bilateral socks sitting EOB with increased time Balance Overall balance assessment: Needs assistance Sitting-balance support: Feet supported Sitting balance-Leahy Scale: Good Sitting balance - Comments: able to don bilateral socks sitting EOB with increased time Postural control: Posterior lean Standing balance support: Reliant on assistive device for balance Standing balance-Leahy Scale: Poor Standing balance comment: reliant on AD and or external support    Special needs/care consideration 9/19 16 fr non latex catheter placed O 2 at 2 liters [place]   Previous Home Environment  Living Arrangements: Children, Spouse/significant other, Other relatives (He and his wife live with son, [name])  Lives With: Family Available Help at Discharge: Family, Available 24 hours/day Type of Home: House Home Layout: Two level, Able to live on main level with bedroom/bathroom Home Access: Stairs to enter Entrance Stairs-Rails: None Entrance Stairs-Number of Steps: 2 Bathroom Shower/Tub: Tub/shower unit, Industrial/product designer: Yes How Accessible: Accessible via walker Home Care Services: No Additional  Comments: Just came back from cruise to [place]  Discharge Living Setting Plans for Discharge Living Setting: Lives with (comment) (he and his wife live with son, [name] and his family) Type of Home at Discharge: House Discharge Home Layout: Two level, Able to live on main level with bedroom/bathroom Discharge Home Access: Stairs to enter Entrance Stairs-Rails: None Entrance Stairs-Number of Steps: 2 Discharge Bathroom Shower/Tub: Tub/shower unit, Curtain Discharge Bathroom Toilet: Standard Discharge Bathroom Accessibility: Yes How Accessible: Accessible via walker Does the patient have any problems obtaining your medications?: No  Social/Family/Support Systems Patient Roles: Spouse, 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 Information: son, [name] and Son, Lyanne Co Anticipated Caregiver: family Anticipated Caregiver's Contact Information: see contacts Ability/Limitations of Caregiver: none Caregiver Availability: 24/7 Discharge Plan Discussed with Primary Caregiver: Yes Is Caregiver In Agreement with Plan?: Yes Does Caregiver/Family have Issues with Lodging/Transportation while Pt is in Rehab?: No  Goals Patient/Family Goal for Rehab: Mod I to supervision with PT and OT Expected length of stay: ELOS 7 to 14 days Cultural Considerations: Hindu with specific dietary requirements related to religious preference Additional Information: Family would like to be given order for grounds pass with family when medically cleared appropriate Pt/Family Agrees to Admission and willing to participate: Yes Program Orientation Provided & Reviewed with Pt/Caregiver Including Roles  & Responsibilities: Yes  Decrease burden of Care through IP rehab admission: n/a  Possible need for SNF placement upon discharge: not anticipated  Patient Condition: I have reviewed medical records from [facility], spoken with CSW, and patient, son, and daughter. I met with patient at the bedside for inpatient  rehabilitation assessment.  Patient will benefit from ongoing PT and OT, can actively participate in 3 hours of therapy a day 5 days of the week, and can make measurable gains during the admission.  Patient will also benefit from the coordinated team approach during an Inpatient Acute Rehabilitation admission.  The patient will receive intensive therapy as well as Rehabilitation physician, nursing, social worker, and care management interventions.  Due to bladder management, bowel management, safety, skin/wound care, disease management, medication administration, pain management, and patient education the patient requires 24 hour a day rehabilitation nursing.  The patient is currently min assist overall with mobility and basic ADLs.  Discharge setting and therapy post discharge at home with home health is anticipated.  Patient has agreed to participate in the Acute Inpatient Rehabilitation Program and will admit today.  Preadmission Screen Completed By:  Cleatrice Burke, 01/17/2022 3:58 PM ______________________________________________________________________   Discussed status with Dr. Naaman Plummer on 01/18/22 at 1013 and received approval for admission today.  Admission Coordinator:  Cleatrice Burke, RN, time 1025 Date 01/18/22   Assessment/Plan: Diagnosis: debility after resp failure/covid Does the need for close, 24 hr/day Medical supervision in concert with the patient's rehab needs make it unreasonable for this patient to be served in a less intensive setting? Yes Co-Morbidities requiring supervision/potential complications: PF, esophageal stenosis, neuropathy Due to bladder management, bowel management, safety, skin/wound care, disease management, medication administration, pain management, and patient education, does the patient require 24 hr/day rehab nursing? Yes Does the patient require coordinated care of a physician, rehab nurse, PT, OT, and   to address physical and functional  deficits in the context of the above medical diagnosis(es)? Yes Addressing deficits in the following areas: balance, endurance, locomotion, strength, transferring, bowel/bladder control, bathing, dressing, feeding, grooming, and toileting Can the patient actively participate in an intensive therapy program of at least 3 hrs of therapy 5 days a week? Yes The potential for patient to make measurable gains while on inpatient rehab is excellent Anticipated functional outcomes upon discharge from inpatient rehab: modified independent and supervision PT, modified independent and supervision OT, n/a SLP Estimated rehab length of stay to reach the above functional goals is: 7-14 days Anticipated discharge destination: Home 10. Overall Rehab/Functional Prognosis: excellent   MD Signature: Meredith Staggers, MD, [place] Director Rehabilitation Services 01/18/2022

## 2022-01-16 NOTE — Progress Notes (Signed)
PT Cancellation Note  Patient Details Name: Jeff Willis MRN: [id number] DOB: 1940/06/10   Cancelled Treatment:    Reason Eval/Treat Not Completed: (P) Other (comment) (Pt just had bath and now preparing to eat lunch. Family requesting PTA attempt again after 2:30pm.) Will continue efforts per PT plan of care as schedule permits.   Thi Klich M Tinisha Etzkorn 01/16/2022, 1:45 PM

## 2022-01-16 NOTE — Progress Notes (Signed)
Inpatient Rehabilitation Admissions Coordinator   I have updated clinicals and will begin Auth with [facility] Medicare for possible Cir admit pending approval.  Danne Baxter, RN, MSN Rehab Admissions Coordinator [phone] 01/16/2022 5:38 PM

## 2022-01-17 ENCOUNTER — Other Ambulatory Visit: Payer: Medicare Other

## 2022-01-17 DIAGNOSIS — U071 COVID-19: Secondary | ICD-10-CM | POA: Diagnosis not present

## 2022-01-17 DIAGNOSIS — J9601 Acute respiratory failure with hypoxia: Secondary | ICD-10-CM | POA: Diagnosis not present

## 2022-01-17 DIAGNOSIS — J849 Interstitial pulmonary disease, unspecified: Secondary | ICD-10-CM | POA: Diagnosis not present

## 2022-01-17 LAB — GLUCOSE, CAPILLARY
Glucose-Capillary: 91 mg/dL (ref 70–99)
Glucose-Capillary: 137 mg/dL — ABNORMAL HIGH (ref 70–99)
Glucose-Capillary: 233 mg/dL — ABNORMAL HIGH (ref 70–99)
Glucose-Capillary: 265 mg/dL — ABNORMAL HIGH (ref 70–99)

## 2022-01-17 MED ORDER — PIRFENIDONE 267 MG PO CAPS
267.0000 mg | ORAL_CAPSULE | ORAL | Status: DC
  Administered 2022-01-17 – 2022-01-18 (×3): 267 mg via ORAL

## 2022-01-17 MED ORDER — PIRFENIDONE 267 MG PO CAPS: 267.0000 mg | ORAL_CAPSULE | Freq: Three times a day (TID) | ORAL | Status: DC

## 2022-01-17 MED ORDER — CHLORHEXIDINE GLUCONATE CLOTH 2 % EX PADS
6.0000 | MEDICATED_PAD | Freq: Every day | CUTANEOUS | Status: DC
  Administered 2022-01-17: 6 via TOPICAL

## 2022-01-17 NOTE — TOC Progression Note (Signed)
Transition of Care [facility]) - Progression Note    Patient Details  Name: Jeff Willis MRN: [id number] Date of Birth: Feb 22, 1941  [facility]) CM/SW [place], RN Phone Number: 01/17/2022, 11:53 AM  Clinical Narrative:    Transition plan for CIR when bed is available.    Expected Discharge Plan: IP Rehab Facility Barriers to Discharge: Continued Medical Work up  Expected Discharge Plan and Services Expected Discharge Plan: [place] In-house Referral: NA Discharge Planning Services: CM Consult Post Acute Care Choice: IP Rehab Living arrangements for the past 2 months: Single Family Home                   DME Agency: NA                   Social Determinants of Health (SDOH) Interventions    Readmission Risk Interventions    01/07/2022    4:36 PM  Readmission Risk Prevention Plan  Transportation Screening Complete  PCP or Specialist Appt within 3-5 Days Complete  HRI or [place] Complete  Social Work Consult for [place] Planning/Counseling Complete  Palliative Care Screening Not Applicable  Medication Review Press photographer) Referral to Pharmacy

## 2022-01-17 NOTE — Progress Notes (Signed)
Physical Therapy Treatment Patient Details Name: Jeff Willis MRN: [id number] DOB: 1940/08/07 Today's Date: 01/17/2022   History of Present Illness Pt is an 81 y.o. male admitted 12/30/21 with hypoxia and failure to thrive after returning home from flight to [place] on same day; of note, pt with recent COVID+ test 12/26/21 while on cruise. Workup for IPF flare secondary to COVID and rhinovirus. ETT 9/5-9/8. PMH includes esophageal stenosis, PAD, GERD, spinal stenosis.    PT Comments    Pt received in supine, pleasantly agreeable to therapy session after recently eating dinner up in chair and c/o throat discomfort from frequent coughing, RN/MD notified. Pt with good tolerance for transfer, gait and stair negotiation instruction, pt needing up to minA for mobility tasks with unilateral UE support and min guard to minA for gait progression with BUE support, with emphasis on activity pacing. Pt reports 5/10 modified RPE (fatigue) at end of session. Pt continues to benefit from PT services to progress toward functional mobility goals.    Recommendations for follow up therapy are one component of a multi-disciplinary discharge planning process, led by the attending physician.  Recommendations may be updated based on patient status, additional functional criteria and insurance authorization.  Follow Up Recommendations  Acute inpatient rehab (3hours/day)     Assistance Recommended at Discharge Frequent or constant Supervision/Assistance  Patient can return home with the following A little help with walking and/or transfers;A little help with bathing/dressing/bathroom;Assistance with cooking/housework;Assist for transportation;Help with stairs or ramp for entrance   Equipment Recommendations  Rolling walker (2 wheels);BSC/3in1    Recommendations for Other Services       Precautions / Restrictions Precautions Precautions: Fall Precaution Comments: Watch SpO2 and HR Restrictions Weight  Bearing Restrictions: No     Mobility  Bed Mobility Overal bed mobility: Needs Assistance Bed Mobility: Sit to Supine, Supine to Sit     Supine to sit: Min guard, HOB elevated Sit to supine: Min guard   General bed mobility comments: needing extra time, pt using bed rail.    Transfers Overall transfer level: Needs assistance Equipment used: Rolling walker (2 wheels) Transfers: Sit to/from Stand Sit to Stand: Min guard           General transfer comment: increased time to perform, cues for safe UE placement and use of brakes using rollator    Ambulation/Gait Ambulation/Gait assistance: Min assist, Min guard Gait Distance (Feet): 60 Feet (x2 with seated break btwn trials, also 60f with HHA) Assistive device: Rollator (4 wheels), 1 person hand held assist Gait Pattern/deviations: Step-to pattern, Narrow base of support, Step-through pattern, Decreased dorsiflexion - right, Decreased dorsiflexion - left Gait velocity: reduced     General Gait Details: cues for improved step length and heel strike with fair carryover. pt with increased WOB with exertion needing min cues for standing breaks and pursed-lip breathing. SpO2 desat to 87% on 2L O2 [place] so increased to 3L. On 3L remains mostly 89-91% wtih exertion and HR to 111 bpm, pt given cues for standing break and pursed lip breathing when SpO2 drops to 88%. Increased time to perform. Pt frequently coughing. HHA for short distance in room without AD, pt using rollator for hallway mobility   Stairs Stairs: Yes Stairs assistance: Min assist Stair Management: One rail Right, Step to pattern, Forwards Number of Stairs: 3 General stair comments: single 7" platform step in room x 3 reps with ~2 minute standing recovery break between each attempt.     Balance Overall balance assessment:  Needs assistance Sitting-balance support: Feet supported Sitting balance-Leahy Scale: Good     Standing balance support: Reliant on assistive  device for balance Standing balance-Leahy Scale: Poor Standing balance comment: reliant on AD and or external support                            Cognition Arousal/Alertness: Awake/alert Behavior During Therapy: Flat affect Overall Cognitive Status: Within Functional Limits for tasks assessed                                 General Comments: Pleasant and cooperative        Exercises Other Exercises Other Exercises: IS x 5 reps (~500 mL)    General Comments General comments (skin integrity, edema, etc.): see gait comments; VSS other than SpO2, needed 3L to maintain 88% and above with mobility, 1L O2 resting      Pertinent Vitals/Pain Pain Assessment Pain Assessment: No/denies pain Pain Intervention(s): Monitored during session, Repositioned           PT Goals (current goals can now be found in the care plan section) Acute Rehab PT Goals Patient Stated Goal: to go home PT Goal Formulation: With patient Time For Goal Achievement: 01/19/22 Progress towards PT goals: Progressing toward goals    Frequency    Min 3X/week      PT Plan Current plan remains appropriate       AM-PAC PT "6 Clicks" Mobility   Outcome Measure  Help needed turning from your back to your side while in a flat bed without using bedrails?: A Little Help needed moving from lying on your back to sitting on the side of a flat bed without using bedrails?: A Little Help needed moving to and from a bed to a chair (including a wheelchair)?: A Little Help needed standing up from a chair using your arms (e.g., wheelchair or bedside chair)?: A Little (safety cues) Help needed to walk in hospital room?: A Lot (mod safety cues and for activity pacing) Help needed climbing 3-5 steps with a railing? : A Lot (dense cues for sequencing/pacing) 6 Click Score: 16    End of Session Equipment Utilized During Treatment: Gait belt;Oxygen Activity Tolerance: Patient tolerated treatment  well Patient left: in bed;with call bell/phone within reach;with bed alarm set;with family/visitor present (heels floated; family members present x2) Nurse Communication: Mobility status;Other (comment) (pt asking for cough suppressant or sore throat medicine) PT Visit Diagnosis: Unsteadiness on feet (R26.81);Muscle weakness (generalized) (M62.81)     Time: 6222-9798 PT Time Calculation (min) (ACUTE ONLY): 38 min  Charges:  $Gait Training: 23-37 mins $Therapeutic Activity: 8-22 mins                     Daritza Brees P., PTA Acute Rehabilitation Services Secure Chat Preferred 9a-5:30pm Office: [place] 01/17/2022, 6:01 PM

## 2022-01-17 NOTE — Progress Notes (Signed)
PROGRESS NOTE    Jeff Willis  GEX:[id number] DOB: 1941/03/29 DOA: 12/30/2021 PCP: Gaynelle Arabian, MD   Brief Narrative: 81 year old with past medical history significant for known esophageal stenosis status post dilation April 2023, IPF on Esbriet (FVC 84% and DLCO 45 % May 2023) patient flew to [place] for a cruise, on 12/24/2021 developed fever cough.  On 12/26/2021 on the cruise he was diagnosed with COVID-19 infection, declined Paxlovid at that time, due to previously experienced side effects from it.  Continue to have cough, failure to thrive, returned to [place] via flight 12/30/2021.  Pulse ox was checked and was found to be hypoxic presented via EMS.  COVID PCR positive.  CT chest with contrast negative for PE, CTA chest neg for PE. Admitted to the hospital 12/30/2021 with acute hypoxic respiratory failure secondary to COVID-19. On 9/5 was intubated due to respiratory failure and extubated on 9/8. TRH assumed care.    Assessment & Plan:   Principal Problem:   Acute respiratory failure with hypoxia (HCC) Active Problems:   Interstitial lung disease ([place])   COVID-19 virus infection   Hyponatremia   History of sinus tachycardia   Pneumonia due to COVID-19 virus   Protein-calorie malnutrition, severe   Pressure injury of skin  Acute Hypoxic respiratory failure IPF flare in the setting of COVID-19 and rhinovirus Possible aspiration pneumonia on 01/10/2022- now resolved Now on 2-4 L of O2 Patient subsequently 9/5 developed worsening respiratory failure requiring intubation, extubated on 9/8 On 9/14 pt requiring about 20 L of high flow nasal cannula on 9/14, PCCM reconsulted, appreciate recs CTA chest negative for PE Completed tapered dose of dexamethasone on 01/10/2022, started  Solu-Medrol on 9/14 as per PCCM, switched to prednisone long taper as per PCCM.  Bactrim 3 times per week as prophylaxis, resume home Esbriet Completed 5 days antibiotics, started Unasyn on 01/10/2022,  plan for 5 days as per PCCM Continue with flutter valve, PRN Mucinex.  Patient needs to be on airborne precaution for covid for 21 days from 8/30 (Covid test perform at the cruise) .  -Patient is off isolation as of today 01/17/2022 . -)Plan for CIR placement, hopefully by tomorrow .  Acute urinary retention -Required in and out multiple times, urology input greatly appreciated, continue with Flomax, voiding trial in 1 week while in CIR, otherwise urology will arrange for follow-up in 2 weeks.   Tachycardia/A-fib Hypertension Demand ischemia Continue with metoprolol PRN hydralazine   Diabetes mellitus type 2 SSI, Accu-Cheks, hypoglycemic protocol  Known esophageal dilation April 2023 History of dysphagia for solid and spices PPI-- change to BID.  MBS: No evidence of aspiration,  A 13 mm pill  lodged briefly in the vallecular space, then above the UES.  It passed through the UES into the cervical esophagus, then returned to the hypopharynx.  A pureed bolus helped pass it through the esophagus without incident/  Patient to follow regular diet but to avoid large bolus of food, hard meat, should eat soft food  Anemia of chronic disease Hb stable.   Severe protein caloric malnutrition due to acute illness Failure to thrive due to acute illness, weight loss from IPF -BSM: Diet advanced to regular, soft, avoid meat, bread or large amount of bolus.  -Nutritionist consulted.   Back pain: acute on chronic. Spondylosis  Started low dose gabapentin 100 mg TID 9/10. Continue with current dose. Pain is getting better, unable to tolerate high dose. Continue with oxycodone PRN  MRI Lumbar Spine: 1. L5-S1  right paracentral disc extrusion with caudal migration, new from the prior exam. Neurosurgery consulted, they think MRI finding consistent with degenerative spondylosis throughout spine. No need for acute intervention.  Blood culture: no growth to date/ Blood culture from admission negative.  Recent ESR normal, CRP normal. Inflammatory marker initially elevate on admission likely related to covid.   Elevated D dimer; in setting of covid Doppler LE negative for DVT On Lovenox. Level decreasing.   See wound care documentation below.  Pressure Injury 01/05/22 Buttocks Right Stage 2 -  Partial thickness loss of dermis presenting as a shallow open injury with a red, pink wound bed without slough. (Active)  01/05/22 2130  Location: Buttocks  Location Orientation: Right  Staging: Stage 2 -  Partial thickness loss of dermis presenting as a shallow open injury with a red, pink wound bed without slough.  Wound Description (Comments):   Present on Admission:   Dressing Type Foam - Lift dressing to assess site every shift 01/16/22 2030     Nutrition Problem: Severe Malnutrition Etiology: chronic illness    Signs/Symptoms: severe muscle depletion, severe fat depletion    Interventions: Ensure Enlive (each supplement provides 350kcal and 20 grams of protein), MVI, Liberalize Diet  Estimated body mass index is 19.46 kg/m as calculated from the following:   Height as of this encounter: _0  (1.676 m).   Weight as of this encounter: 54.7 kg.   DVT prophylaxis: Lovenox Code Status: Full code Family Communication: Son at bedside Disposition Plan:  Status is: Inpatient plan for CIR in a.m. Remains inpatient appropriate because: management resp failure    Consultants:  PCCM  Procedures:  Mechanical ventilation  Antimicrobials:  Unasyn    Subjective:  No significant events overnight, patient denies any complaints today   Objective: Vitals:   01/17/22 0426 01/17/22 0500 01/17/22 0840 01/17/22 1235  BP: 118/73  116/73 (!) 119/58  Pulse: 78  95 88  Resp: (!) 22  20 (!) 25  Temp: 98 F (36.7 C)  98 F (36.7 C) 97.8 F (36.6 C)  TempSrc: Oral  Oral Oral  SpO2: 97%  90% 99%  Weight:  54.7 kg    Height:        Intake/Output Summary (Last 24 hours) at  01/17/2022 1335 Last data filed at 01/17/2022 1024 Gross per 24 hour  Intake --  Output 1425 ml  Net -1425 ml   Filed Weights   01/15/22 0500 01/16/22 0500 01/17/22 0500  Weight: 58.8 kg 58.8 kg 54.7 kg    Examination:  Awake Alert, Oriented X 3, No new F.N deficits, Normal affect, frail Symmetrical Chest wall movement, Good air movement bilaterally, CTAB RRR,No Gallops,Rubs or new Murmurs, No Parasternal Heave +ve B.Sounds, Abd Soft, No tenderness, No rebound - guarding or rigidity. No Cyanosis, Clubbing or edema, No new Rash or bruise      Data Reviewed: I have personally reviewed following labs and imaging studies  CBC: Recent Labs  Lab 01/12/22 0727 01/13/22 0412 01/14/22 0341 01/15/22 0244 01/16/22 0601  WBC 17.4* 13.9* 11.9* 10.8* 11.0*  NEUTROABS 14.8* 11.8* 10.0* 8.8* 8.8*  HGB 11.0* 11.0* 10.6* 10.8* 11.4*  HCT 31.7* 31.9* 31.6* 31.6* 33.0*  MCV 95.5 95.8 95.8 96.3 95.7  PLT 182 186 199 185 974   Basic Metabolic Panel: Recent Labs  Lab 01/11/22 0532 01/12/22 0727 01/13/22 0412 01/14/22 0341 01/15/22 0244 01/16/22 0601  NA 135 135 138 135 136 140  K 4.1 4.3 4.1 3.6 3.9 3.6  CL 101 99 103 100 102 102  CO2 _0 GLUCOSE 121* 78 81 105* 110* 88  BUN 30* 28* _1 CREATININE 1.00 0.89 0.95 0.87 0.85 0.89  CALCIUM 8.5* 8.4* 8.5* 8.3* 8.6* 9.1  MG 1.9 1.9 2.1 2.1 2.0 1.9  PHOS 4.5 2.3* 2.7 2.4* 2.4*  --    GFR: Estimated Creatinine Clearance: 51.2 mL/min (by C-G formula based on SCr of 0.89 mg/dL). Liver Function Tests: Recent Labs  Lab 01/15/22 0244 01/16/22 0601  AST 40 36  ALT 109* 115*  ALKPHOS 54 63  BILITOT 0.6 0.9  PROT 4.8* 5.1*  ALBUMIN 2.6* 2.7*    No results for input(s): "LIPASE", "AMYLASE" in the last 168 hours. No results for input(s): "AMMONIA" in the last 168 hours. Coagulation Profile: No results for input(s): "INR", "PROTIME" in the last 168 hours.  Cardiac Enzymes: No results for input(s):  "CKTOTAL", "CKMB", "CKMBINDEX", "TROPONINI" in the last 168 hours. BNP (last 3 results) Recent Labs    09/07/21 1218  PROBNP 211.0*   HbA1C: No results for input(s): "HGBA1C" in the last 72 hours. CBG: Recent Labs  Lab 01/16/22 1243 01/16/22 1644 01/16/22 2142 01/17/22 0838 01/17/22 1235  GLUCAP 136* 294* 238* 91 137*   Lipid Profile: No results for input(s): "CHOL", "HDL", "LDLCALC", "TRIG", "CHOLHDL", "LDLDIRECT" in the last 72 hours.  Thyroid Function Tests: No results for input(s): "TSH", "T4TOTAL", "FREET4", "T3FREE", "THYROIDAB" in the last 72 hours. Anemia Panel: No results for input(s): "VITAMINB12", "FOLATE", "FERRITIN", "TIBC", "IRON", "RETICCTPCT" in the last 72 hours.  Sepsis Labs: No results for input(s): "PROCALCITON", "LATICACIDVEN" in the last 168 hours.  No results found for this or any previous visit (from the past 240 hour(s)).        Radiology Studies: No results found.      Scheduled Meds:  aspirin  81 mg Oral Daily   Chlorhexidine Gluconate Cloth  6 each Topical Daily   enoxaparin (LOVENOX) injection  40 mg Subcutaneous Q24H   feeding supplement  237 mL Oral BID BM   gabapentin  100 mg Oral TID   insulin aspart  0-6 Units Subcutaneous TID WC   insulin detemir  6 Units Subcutaneous Daily   lidocaine  1 patch Transdermal Q24H   metoprolol tartrate  12.5 mg Oral BID   multivitamin with minerals  1 tablet Oral Daily   pantoprazole  40 mg Oral BID   phosphorus  500 mg Oral TID   Pirfenidone  267 mg Oral 3 times per day   polyethylene glycol  17 g Oral BID   predniSONE  40 mg Oral Q breakfast   Followed by   Derrill Memo ON 01/29/2022] predniSONE  30 mg Oral Q breakfast   Followed by   Derrill Memo ON 02/12/2022] predniSONE  20 mg Oral Q breakfast   Followed by   Derrill Memo ON 02/26/2022] predniSONE  10 mg Oral Q breakfast   Followed by   Derrill Memo ON 03/12/2022] predniSONE  5 mg Oral Q breakfast   rosuvastatin  10 mg Oral QHS   senna  2 tablet Oral  Daily   [START ON 01/18/2022] sulfamethoxazole-trimethoprim  1 tablet Oral Once per day on Mon Wed Fri   tamsulosin  0.4 mg Oral QPC supper   Continuous Infusions:  sodium chloride Stopped (01/01/22 0609)     LOS: 63 days       Phillips Climes, MD [facility]   If 7PM-7AM, please contact night-coverage  [email or web address]  01/17/2022, 1:35 PM

## 2022-01-17 NOTE — Progress Notes (Signed)
  Inpatient Rehabilitation Admissions Coordinator   I met at bedside with patient and son, Lyanne Co. I have insurance approval but no bed available until Friday to admit. They are in agreement. I have alerted acute team and TOC. I will follow up in the am to make the arrangements.  Danne Baxter, RN, MSN Rehab Admissions Coordinator [phone] 01/17/2022 1:29 PM

## 2022-01-18 ENCOUNTER — Encounter (HOSPITAL_COMMUNITY): Payer: Self-pay | Admitting: Physical Medicine and Rehabilitation

## 2022-01-18 ENCOUNTER — Inpatient Hospital Stay (HOSPITAL_COMMUNITY)
Admission: RE | Admit: 2022-01-18 | Discharge: 2022-01-28 | DRG: 945 | Disposition: A | Discharge status: Home Health | Payer: Medicare Other | Source: Intra-hospital | Attending: Physical Medicine and Rehabilitation | Admitting: Physical Medicine and Rehabilitation

## 2022-01-18 ENCOUNTER — Other Ambulatory Visit: Payer: Self-pay

## 2022-01-18 DIAGNOSIS — E7800 Pure hypercholesterolemia, unspecified: Secondary | ICD-10-CM | POA: Diagnosis present

## 2022-01-18 DIAGNOSIS — B370 Candidal stomatitis: Secondary | ICD-10-CM | POA: Diagnosis present

## 2022-01-18 DIAGNOSIS — R053 Chronic cough: Secondary | ICD-10-CM | POA: Diagnosis not present

## 2022-01-18 DIAGNOSIS — Q394 Esophageal web: Secondary | ICD-10-CM

## 2022-01-18 DIAGNOSIS — Z91013 Allergy to seafood: Secondary | ICD-10-CM

## 2022-01-18 DIAGNOSIS — E43 Unspecified severe protein-calorie malnutrition: Secondary | ICD-10-CM | POA: Diagnosis present

## 2022-01-18 DIAGNOSIS — R7303 Prediabetes: Secondary | ICD-10-CM | POA: Diagnosis present

## 2022-01-18 DIAGNOSIS — J1282 Pneumonia due to coronavirus disease 2019: Secondary | ICD-10-CM | POA: Diagnosis not present

## 2022-01-18 DIAGNOSIS — Z681 Body mass index (BMI) 19 or less, adult: Secondary | ICD-10-CM | POA: Diagnosis not present

## 2022-01-18 DIAGNOSIS — G629 Polyneuropathy, unspecified: Secondary | ICD-10-CM | POA: Diagnosis present

## 2022-01-18 DIAGNOSIS — Z8701 Personal history of pneumonia (recurrent): Secondary | ICD-10-CM | POA: Diagnosis not present

## 2022-01-18 DIAGNOSIS — Z741 Need for assistance with personal care: Secondary | ICD-10-CM | POA: Diagnosis present

## 2022-01-18 DIAGNOSIS — Z91018 Allergy to other foods: Secondary | ICD-10-CM

## 2022-01-18 DIAGNOSIS — J84112 Idiopathic pulmonary fibrosis: Secondary | ICD-10-CM | POA: Diagnosis present

## 2022-01-18 DIAGNOSIS — J8410 Pulmonary fibrosis, unspecified: Secondary | ICD-10-CM | POA: Diagnosis not present

## 2022-01-18 DIAGNOSIS — M545 Low back pain, unspecified: Secondary | ICD-10-CM | POA: Diagnosis present

## 2022-01-18 DIAGNOSIS — E871 Hypo-osmolality and hyponatremia: Secondary | ICD-10-CM | POA: Diagnosis present

## 2022-01-18 DIAGNOSIS — G250 Essential tremor: Secondary | ICD-10-CM | POA: Diagnosis present

## 2022-01-18 DIAGNOSIS — R1310 Dysphagia, unspecified: Secondary | ICD-10-CM | POA: Diagnosis present

## 2022-01-18 DIAGNOSIS — J849 Interstitial pulmonary disease, unspecified: Secondary | ICD-10-CM | POA: Diagnosis not present

## 2022-01-18 DIAGNOSIS — Z8546 Personal history of malignant neoplasm of prostate: Secondary | ICD-10-CM | POA: Diagnosis not present

## 2022-01-18 DIAGNOSIS — D696 Thrombocytopenia, unspecified: Secondary | ICD-10-CM | POA: Diagnosis not present

## 2022-01-18 DIAGNOSIS — Z888 Allergy status to other drugs, medicaments and biological substances status: Secondary | ICD-10-CM

## 2022-01-18 DIAGNOSIS — M48061 Spinal stenosis, lumbar region without neurogenic claudication: Secondary | ICD-10-CM | POA: Diagnosis present

## 2022-01-18 DIAGNOSIS — G8929 Other chronic pain: Secondary | ICD-10-CM | POA: Diagnosis present

## 2022-01-18 DIAGNOSIS — Z7982 Long term (current) use of aspirin: Secondary | ICD-10-CM

## 2022-01-18 DIAGNOSIS — Z79899 Other long term (current) drug therapy: Secondary | ICD-10-CM

## 2022-01-18 DIAGNOSIS — R269 Unspecified abnormalities of gait and mobility: Secondary | ICD-10-CM | POA: Diagnosis present

## 2022-01-18 DIAGNOSIS — E0965 Drug or chemical induced diabetes mellitus with hyperglycemia: Secondary | ICD-10-CM

## 2022-01-18 DIAGNOSIS — U071 COVID-19: Secondary | ICD-10-CM | POA: Diagnosis not present

## 2022-01-18 DIAGNOSIS — Z8616 Personal history of COVID-19: Secondary | ICD-10-CM | POA: Diagnosis not present

## 2022-01-18 DIAGNOSIS — I10 Essential (primary) hypertension: Secondary | ICD-10-CM | POA: Diagnosis present

## 2022-01-18 DIAGNOSIS — R5381 Other malaise: Secondary | ICD-10-CM | POA: Diagnosis present

## 2022-01-18 DIAGNOSIS — R1319 Other dysphagia: Secondary | ICD-10-CM

## 2022-01-18 DIAGNOSIS — E876 Hypokalemia: Secondary | ICD-10-CM | POA: Diagnosis present

## 2022-01-18 DIAGNOSIS — T380X5D Adverse effect of glucocorticoids and synthetic analogues, subsequent encounter: Secondary | ICD-10-CM

## 2022-01-18 DIAGNOSIS — M4807 Spinal stenosis, lumbosacral region: Secondary | ICD-10-CM | POA: Diagnosis present

## 2022-01-18 DIAGNOSIS — R0982 Postnasal drip: Secondary | ICD-10-CM | POA: Diagnosis not present

## 2022-01-18 DIAGNOSIS — R339 Retention of urine, unspecified: Secondary | ICD-10-CM | POA: Diagnosis present

## 2022-01-18 DIAGNOSIS — R739 Hyperglycemia, unspecified: Secondary | ICD-10-CM | POA: Diagnosis present

## 2022-01-18 DIAGNOSIS — Z91012 Allergy to eggs: Secondary | ICD-10-CM

## 2022-01-18 DIAGNOSIS — K219 Gastro-esophageal reflux disease without esophagitis: Secondary | ICD-10-CM | POA: Diagnosis present

## 2022-01-18 DIAGNOSIS — Z91014 Allergy to mammalian meats: Secondary | ICD-10-CM

## 2022-01-18 DIAGNOSIS — R058 Other specified cough: Secondary | ICD-10-CM | POA: Diagnosis present

## 2022-01-18 DIAGNOSIS — J9601 Acute respiratory failure with hypoxia: Secondary | ICD-10-CM | POA: Diagnosis not present

## 2022-01-18 DIAGNOSIS — Z8719 Personal history of other diseases of the digestive system: Secondary | ICD-10-CM

## 2022-01-18 LAB — GLUCOSE, CAPILLARY
Glucose-Capillary: 84 mg/dL (ref 70–99)
Glucose-Capillary: 156 mg/dL — ABNORMAL HIGH (ref 70–99)
Glucose-Capillary: 205 mg/dL — ABNORMAL HIGH (ref 70–99)
Glucose-Capillary: 262 mg/dL — ABNORMAL HIGH (ref 70–99)

## 2022-01-18 MED ORDER — TAMSULOSIN HCL 0.4 MG PO CAPS: 0.4000 mg | ORAL_CAPSULE | Freq: Every day | ORAL | Status: DC

## 2022-01-18 MED ORDER — OXYCODONE HCL 5 MG PO TABS: 5.0000 mg | ORAL_TABLET | ORAL | Status: DC | PRN

## 2022-01-18 MED ORDER — SULFAMETHOXAZOLE-TRIMETHOPRIM 800-160 MG PO TABS
1.0000 | ORAL_TABLET | ORAL | Status: DC
  Administered 2022-01-23 – 2022-01-28 (×3): 1 via ORAL
  Filled 2022-01-18 (×3): qty 1

## 2022-01-18 MED ORDER — ADULT MULTIVITAMIN W/MINERALS CH
1.0000 | ORAL_TABLET | Freq: Every day | ORAL | Status: DC
  Administered 2022-01-19 – 2022-01-28 (×10): 1 via ORAL
  Filled 2022-01-18 (×10): qty 1

## 2022-01-18 MED ORDER — GUAIFENESIN-DM 100-10 MG/5ML PO SYRP
5.0000 mL | ORAL_SOLUTION | Freq: Four times a day (QID) | ORAL | Status: DC | PRN
  Administered 2022-01-18: 10 mL via ORAL
  Filled 2022-01-18: qty 10

## 2022-01-18 MED ORDER — ENSURE MAX PROTEIN PO LIQD
11.0000 [oz_av] | Freq: Two times a day (BID) | ORAL | Status: DC
  Administered 2022-01-18 – 2022-01-27 (×14): 11 [oz_av] via ORAL
  Filled 2022-01-18: qty 330

## 2022-01-18 MED ORDER — PROCHLORPERAZINE MALEATE 5 MG PO TABS: 5.0000 mg | ORAL_TABLET | Freq: Four times a day (QID) | ORAL | Status: DC | PRN

## 2022-01-18 MED ORDER — ENOXAPARIN SODIUM 40 MG/0.4ML IJ SOSY
40.0000 mg | PREFILLED_SYRINGE | INTRAMUSCULAR | Status: DC
  Administered 2022-01-19 – 2022-01-28 (×8): 40 mg via SUBCUTANEOUS
  Filled 2022-01-18 (×10): qty 0.4

## 2022-01-18 MED ORDER — GLUCERNA SHAKE PO LIQD: 237.0000 mL | Freq: Three times a day (TID) | ORAL | 0 refills | Status: DC

## 2022-01-18 MED ORDER — PREDNISONE 10 MG PO TABS: 10.0000 mg | ORAL_TABLET | Freq: Every day | ORAL | Status: DC

## 2022-01-18 MED ORDER — IPRATROPIUM-ALBUTEROL 0.5-2.5 (3) MG/3ML IN SOLN: 3.0000 mL | Freq: Four times a day (QID) | RESPIRATORY_TRACT | Status: DC | PRN

## 2022-01-18 MED ORDER — SALINE SPRAY 0.65 % NA SOLN: 1.0000 | NASAL | Status: DC | PRN

## 2022-01-18 MED ORDER — METOPROLOL TARTRATE 12.5 MG HALF TABLET
12.5000 mg | ORAL_TABLET | Freq: Two times a day (BID) | ORAL | Status: DC
  Administered 2022-01-18 – 2022-01-25 (×14): 12.5 mg via ORAL
  Filled 2022-01-18 (×14): qty 1

## 2022-01-18 MED ORDER — POLYETHYLENE GLYCOL 3350 17 G PO PACK
17.0000 g | PACK | Freq: Two times a day (BID) | ORAL | Status: DC
  Administered 2022-01-20 – 2022-01-28 (×10): 17 g via ORAL
  Filled 2022-01-18 (×17): qty 1

## 2022-01-18 MED ORDER — PREDNISONE 20 MG PO TABS: 40.0000 mg | ORAL_TABLET | Freq: Every day | ORAL | Status: DC

## 2022-01-18 MED ORDER — GUAIFENESIN 100 MG/5ML PO LIQD: 5.0000 mL | ORAL | 0 refills | Status: DC | PRN

## 2022-01-18 MED ORDER — PIRFENIDONE 267 MG PO CAPS
267.0000 mg | ORAL_CAPSULE | ORAL | Status: DC
  Administered 2022-01-18 – 2022-01-28 (×29): 267 mg via ORAL
  Filled 2022-01-18: qty 1

## 2022-01-18 MED ORDER — TRAZODONE HCL 50 MG PO TABS: 25.0000 mg | ORAL_TABLET | Freq: Every evening | ORAL | Status: DC | PRN

## 2022-01-18 MED ORDER — GABAPENTIN 250 MG/5ML PO SOLN: 100.0000 mg | Freq: Three times a day (TID) | ORAL | 12 refills | Status: DC

## 2022-01-18 MED ORDER — POLYETHYLENE GLYCOL 3350 17 G PO PACK: 17.0000 g | PACK | Freq: Two times a day (BID) | ORAL | 0 refills | Status: DC

## 2022-01-18 MED ORDER — INSULIN DETEMIR 100 UNIT/ML ~~LOC~~ SOLN
6.0000 [IU] | Freq: Every day | SUBCUTANEOUS | Status: DC
  Administered 2022-01-19 – 2022-01-20 (×2): 6 [IU] via SUBCUTANEOUS
  Filled 2022-01-18 (×2): qty 0.06

## 2022-01-18 MED ORDER — PREDNISONE 20 MG PO TABS: 20.0000 mg | ORAL_TABLET | Freq: Every day | ORAL | Status: DC

## 2022-01-18 MED ORDER — METOPROLOL TARTRATE 25 MG PO TABS: 12.5000 mg | ORAL_TABLET | Freq: Two times a day (BID) | ORAL | Status: DC

## 2022-01-18 MED ORDER — PREDNISONE 20 MG PO TABS
40.0000 mg | ORAL_TABLET | Freq: Every day | ORAL | Status: AC
  Administered 2022-01-19 – 2022-01-28 (×10): 40 mg via ORAL
  Filled 2022-01-18 (×10): qty 2

## 2022-01-18 MED ORDER — BISACODYL 10 MG RE SUPP: 10.0000 mg | Freq: Every day | RECTAL | Status: DC | PRN

## 2022-01-18 MED ORDER — MELATONIN 3 MG PO TABS: 3.0000 mg | ORAL_TABLET | Freq: Every evening | ORAL | Status: DC | PRN

## 2022-01-18 MED ORDER — PREDNISONE 5 MG PO TABS: 30.0000 mg | ORAL_TABLET | Freq: Every day | ORAL | Status: DC

## 2022-01-18 MED ORDER — PANTOPRAZOLE SODIUM 40 MG PO TBEC: 40.0000 mg | DELAYED_RELEASE_TABLET | Freq: Two times a day (BID) | ORAL | Status: DC

## 2022-01-18 MED ORDER — PANTOPRAZOLE SODIUM 40 MG PO TBEC
40.0000 mg | DELAYED_RELEASE_TABLET | Freq: Two times a day (BID) | ORAL | Status: DC
  Administered 2022-01-18 – 2022-01-28 (×20): 40 mg via ORAL
  Filled 2022-01-18 (×20): qty 1

## 2022-01-18 MED ORDER — GLUCERNA SHAKE PO LIQD
237.0000 mL | Freq: Three times a day (TID) | ORAL | Status: DC
  Administered 2022-01-18: 237 mL via ORAL

## 2022-01-18 MED ORDER — SENNA 8.6 MG PO TABS: 2.0000 | ORAL_TABLET | Freq: Every day | ORAL | 0 refills | Status: DC

## 2022-01-18 MED ORDER — INSULIN DETEMIR 100 UNIT/ML ~~LOC~~ SOLN: 6.0000 [IU] | Freq: Every day | SUBCUTANEOUS | 11 refills | Status: DC

## 2022-01-18 MED ORDER — PROCHLORPERAZINE 25 MG RE SUPP: 12.5000 mg | Freq: Four times a day (QID) | RECTAL | Status: DC | PRN

## 2022-01-18 MED ORDER — POLYETHYLENE GLYCOL 3350 17 G PO PACK: 17.0000 g | PACK | Freq: Every day | ORAL | Status: DC | PRN

## 2022-01-18 MED ORDER — PROCHLORPERAZINE EDISYLATE 10 MG/2ML IJ SOLN: 5.0000 mg | Freq: Four times a day (QID) | INTRAMUSCULAR | Status: DC | PRN

## 2022-01-18 MED ORDER — PREDNISONE 5 MG PO TABS: 5.0000 mg | ORAL_TABLET | Freq: Every day | ORAL | Status: DC

## 2022-01-18 MED ORDER — INSULIN ASPART 100 UNIT/ML IJ SOLN: 0.0000 [IU] | Freq: Three times a day (TID) | INTRAMUSCULAR | 11 refills | Status: DC

## 2022-01-18 MED ORDER — DIPHENHYDRAMINE HCL 12.5 MG/5ML PO ELIX: 12.5000 mg | ORAL_SOLUTION | Freq: Four times a day (QID) | ORAL | Status: DC | PRN

## 2022-01-18 MED ORDER — TAMSULOSIN HCL 0.4 MG PO CAPS
0.4000 mg | ORAL_CAPSULE | Freq: Every day | ORAL | Status: DC
  Administered 2022-01-18 – 2022-01-22 (×5): 0.4 mg via ORAL
  Filled 2022-01-18 (×5): qty 1

## 2022-01-18 MED ORDER — ENOXAPARIN SODIUM 40 MG/0.4ML IJ SOSY: 40.0000 mg | PREFILLED_SYRINGE | INTRAMUSCULAR | Status: DC

## 2022-01-18 MED ORDER — PREDNISONE 5 MG PO TABS: 10.0000 mg | ORAL_TABLET | Freq: Every day | ORAL | Status: DC

## 2022-01-18 MED ORDER — ROSUVASTATIN CALCIUM 5 MG PO TABS
10.0000 mg | ORAL_TABLET | Freq: Every day | ORAL | Status: DC
  Administered 2022-01-18 – 2022-01-27 (×10): 10 mg via ORAL
  Filled 2022-01-18 (×10): qty 2

## 2022-01-18 MED ORDER — INSULIN ASPART 100 UNIT/ML IJ SOLN
0.0000 [IU] | Freq: Every day | INTRAMUSCULAR | Status: DC
  Administered 2022-01-18 – 2022-01-19 (×2): 3 [IU] via SUBCUTANEOUS
  Administered 2022-01-20 – 2022-01-21 (×2): 2 [IU] via SUBCUTANEOUS

## 2022-01-18 MED ORDER — LIDOCAINE 5 % EX PTCH
1.0000 | MEDICATED_PATCH | CUTANEOUS | Status: DC
  Administered 2022-01-19 – 2022-01-27 (×9): 1 via TRANSDERMAL
  Filled 2022-01-18 (×10): qty 1

## 2022-01-18 MED ORDER — GABAPENTIN 250 MG/5ML PO SOLN
100.0000 mg | Freq: Three times a day (TID) | ORAL | Status: DC
  Administered 2022-01-18 – 2022-01-28 (×27): 100 mg via ORAL
  Filled 2022-01-18 (×32): qty 2

## 2022-01-18 MED ORDER — PREDNISONE 10 MG PO TABS: 30.0000 mg | ORAL_TABLET | Freq: Every day | ORAL | Status: DC

## 2022-01-18 MED ORDER — FLEET ENEMA 7-19 GM/118ML RE ENEM
1.0000 | ENEMA | Freq: Once | RECTAL | Status: AC | PRN
  Administered 2022-01-25: 1 via RECTAL
  Filled 2022-01-18: qty 1

## 2022-01-18 MED ORDER — ADULT MULTIVITAMIN W/MINERALS CH: 1.0000 | ORAL_TABLET | Freq: Every day | ORAL | Status: DC

## 2022-01-18 MED ORDER — ASPIRIN 81 MG PO CHEW
81.0000 mg | CHEWABLE_TABLET | Freq: Every day | ORAL | Status: DC
  Administered 2022-01-19 – 2022-01-28 (×10): 81 mg via ORAL
  Filled 2022-01-18 (×10): qty 1

## 2022-01-18 MED ORDER — ACETAMINOPHEN 325 MG PO TABS: 650.0000 mg | ORAL_TABLET | Freq: Four times a day (QID) | ORAL | Status: DC | PRN

## 2022-01-18 MED ORDER — LIVING WELL WITH DIABETES BOOK
Freq: Once | Status: AC
  Filled 2022-01-18: qty 1

## 2022-01-18 MED ORDER — BENZONATATE 100 MG PO CAPS
100.0000 mg | ORAL_CAPSULE | Freq: Three times a day (TID) | ORAL | Status: DC | PRN
  Administered 2022-01-18 – 2022-01-27 (×9): 100 mg via ORAL
  Filled 2022-01-18 (×13): qty 1

## 2022-01-18 MED ORDER — ONDANSETRON HCL 4 MG PO TABS: 4.0000 mg | ORAL_TABLET | Freq: Four times a day (QID) | ORAL | 0 refills | Status: DC | PRN

## 2022-01-18 MED ORDER — CYCLOBENZAPRINE HCL 5 MG PO TABS: 5.0000 mg | ORAL_TABLET | Freq: Two times a day (BID) | ORAL | Status: DC | PRN

## 2022-01-18 MED ORDER — CYCLOBENZAPRINE HCL 5 MG PO TABS: 5.0000 mg | ORAL_TABLET | Freq: Two times a day (BID) | ORAL | 0 refills | Status: DC | PRN

## 2022-01-18 MED ORDER — ACETAMINOPHEN 325 MG PO TABS: 325.0000 mg | ORAL_TABLET | ORAL | Status: DC | PRN

## 2022-01-18 MED ORDER — ORAL CARE MOUTH RINSE: 15.0000 mL | OROMUCOSAL | Status: DC | PRN

## 2022-01-18 MED ORDER — SULFAMETHOXAZOLE-TRIMETHOPRIM 800-160 MG PO TABS: 1.0000 | ORAL_TABLET | ORAL | Status: DC

## 2022-01-18 MED ORDER — INSULIN ASPART 100 UNIT/ML IJ SOLN
0.0000 [IU] | Freq: Three times a day (TID) | INTRAMUSCULAR | Status: DC
  Administered 2022-01-18 – 2022-01-19 (×2): 2 [IU] via SUBCUTANEOUS
  Administered 2022-01-19: 1 [IU] via SUBCUTANEOUS
  Administered 2022-01-20: 2 [IU] via SUBCUTANEOUS
  Administered 2022-01-21: 3 [IU] via SUBCUTANEOUS
  Administered 2022-01-21: 1 [IU] via SUBCUTANEOUS
  Administered 2022-01-22: 3 [IU] via SUBCUTANEOUS
  Administered 2022-01-22 – 2022-01-23 (×2): 1 [IU] via SUBCUTANEOUS
  Administered 2022-01-23: 2 [IU] via SUBCUTANEOUS
  Administered 2022-01-24 (×2): 1 [IU] via SUBCUTANEOUS
  Administered 2022-01-25: 2 [IU] via SUBCUTANEOUS
  Administered 2022-01-26: 1 [IU] via SUBCUTANEOUS
  Administered 2022-01-26: 2 [IU] via SUBCUTANEOUS

## 2022-01-18 MED ORDER — SENNA 8.6 MG PO TABS
2.0000 | ORAL_TABLET | Freq: Every day | ORAL | Status: DC
  Administered 2022-01-19 – 2022-01-28 (×8): 17.2 mg via ORAL
  Filled 2022-01-18 (×10): qty 2

## 2022-01-18 MED ORDER — ALUM & MAG HYDROXIDE-SIMETH 200-200-20 MG/5ML PO SUSP: 30.0000 mL | ORAL | Status: DC | PRN

## 2022-01-18 NOTE — Discharge Summary (Signed)
Physician Discharge Summary  Jeff Willis ZOX:[id number] DOB: 1941-01-24 DOA: 12/30/2021  PCP: Gaynelle Arabian, MD  Admit date: 12/30/2021 Discharge date: 01/18/2022  Admitted From: Home Disposition:  CIR  Recommendations for Outpatient Follow-up:  Continue with prolonged steroid taper, and Bactrim prophylaxis upon discharge from CIR Patient will be discharged on insulin currently, he is not diabetic, but this is related to steroid-induced hyperglycemia, and CBG should improve once his steroid is being tapered, so decrease insulin when appropriate. Please attempt voiding trial in 4 to 5 days when patient is ambulatory  Discharge Condition:Stable CODE STATUS:FULL Diet recommendation: Carb Modified  Brief/Interim Summary:  81 year old with past medical history significant for known esophageal stenosis status post dilation April 2023, IPF on Esbriet (FVC 84% and DLCO 45 % May 2023) patient flew to [place] for a cruise, on 12/24/2021 developed fever cough.  On 12/26/2021 on the cruise he was diagnosed with COVID-19 infection, declined Paxlovid at that time, due to previously experienced side effects from it.  Continue to have cough, failure to thrive, returned to [place] via flight 12/30/2021.  Pulse ox was checked and was found to be hypoxic presented via EMS.  COVID PCR positive.  CT chest with contrast negative for PE, CTA chest neg for PE. Admitted to the hospital 12/30/2021 with acute hypoxic respiratory failure secondary to COVID-19. On 9/5 was intubated due to respiratory failure and extubated on 9/8. TR[facility] assumed care.  Acute Hypoxic respiratory failure IPF flare in the setting of COVID-19 and rhinovirus Possible aspiration pneumonia on 01/10/2022- now resolved Now on 2-4 L of O2 Patient subsequently 9/5 developed worsening respiratory failure requiring intubation, extubated on 9/8 On 9/14 pt requiring about 20 L of high flow nasal cannula on 9/14, PCCM reconsulted CTA chest negative  for PE Completed tapered dose of dexamethasone on 01/10/2022, started  Solu-Medrol on 9/14 as per PCCM, switched to prednisone long taper as per PCCM.  Bactrim 3 times per week as prophylaxis. - resume home Esbriet - Completed 5 days antibiotics, started Unasyn on 01/10/2022, plan for 5 days as per PCCM Continue with flutter valve, PRN Mucinex.  - off COVID-19 precautions 01/17/2022   Acute urinary retention -Required in and out multiple times, urology input greatly appreciated, continue with Flomax, voiding trial in in 4 to 5 days while in CIR, otherwise urology will arrange for follow-up in 2 weeks if voiding trial unsuccessful. -Continue with Flomax     Tachycardia/A-fib Hypertension Demand ischemia Continue with metoprolol    Hyperglycemia due to steroids Prediabetes -A1c is 5.8, patient with hyperglycemia during hospital stay, this is likely in the setting of steroid use, he is started on insulin sliding scale, and low-dose Levemir which will be continued upon discharge CIR, but I anticipate once steroid is being gradually tapered he will come off insulin. -On carb modified diet, I did change Ensure to Glucerna as well.   Known esophageal dilation April 2023 History of dysphagia for solid and spices PPI-- change to BID.  MBS: No evidence of aspiration,  A 13 mm pill  lodged briefly in the vallecular space, then above the UES.  It passed through the UES into the cervical esophagus, then returned to the hypopharynx.  A pureed bolus helped pass it through the esophagus without incident/  Patient to follow regular diet but to avoid large bolus of food, hard meat, should eat soft food   Anemia of chronic disease Hb stable.    Severe protein caloric malnutrition due to acute illness Failure  to thrive due to acute illness, weight loss from IPF -BSM: Diet advanced to regular, soft, avoid meat, bread or large amount of bolus.  -Nutritionist consulted.    Back pain: acute on chronic.  Spondylosis  Started low dose gabapentin 100 mg TID 9/10. Continue with current dose. Pain is getting better, unable to tolerate high dose. Continue with oxycodone PRN   MRI Lumbar Spine: 1. L5-S1 right paracentral disc extrusion with caudal migration, new from the prior exam. Neurosurgery consulted, they think MRI finding consistent with degenerative spondylosis throughout spine. No need for acute intervention.  Blood culture: no growth to date/ Blood culture from admission negative. Recent ESR normal, CRP normal. Inflammatory marker initially elevate on admission likely related to covid.    Elevated D dimer; in setting of covid Doppler LE negative for DVT On Lovenox. Level decreasing.   Discharge Diagnoses:  Principal Problem:   Acute respiratory failure with hypoxia ([place]) Active Problems:   Interstitial lung disease ([place])   COVID-19 virus infection   Hyponatremia   History of sinus tachycardia   Pneumonia due to COVID-19 virus   Protein-calorie malnutrition, severe   Pressure injury of skin    Discharge Instructions  Discharge Instructions     Diet Carb Modified   Complete by: As directed    Discharge instructions   Complete by: As directed    Management per CIR   Increase activity slowly   Complete by: As directed    No wound care   Complete by: As directed       Allergies as of 01/18/2022       Reactions   Beef-derived Products    Chicken Protein    Eggs Or Egg-derived Products    Fish-derived Products    Meat Extract    vegetarian   Ofev [nintedanib] Other (See Comments)   Weight loss   Pork-derived Products         Medication List     STOP taking these medications    metoprolol succinate 25 MG 24 hr tablet Commonly known as: Toprol XL       TAKE these medications    acetaminophen 325 MG tablet Commonly known as: TYLENOL Take 2 tablets (650 mg total) by mouth every 6 (six) hours as needed for mild pain or headache (fever >/= 101).    aspirin EC 81 MG tablet Take 81 mg by mouth daily. Swallow whole.   cyclobenzaprine 5 MG tablet Commonly known as: FLEXERIL Take 1 tablet (5 mg total) by mouth 2 (two) times daily as needed for muscle spasms.   enoxaparin 40 MG/0.4ML injection Commonly known as: LOVENOX Inject 0.4 mLs (40 mg total) into the skin daily.   feeding supplement (GLUCERNA SHAKE) Liqd Take 237 mLs by mouth 3 (three) times daily between meals.   gabapentin 250 MG/5ML solution Commonly known as: NEURONTIN Take 2 mLs (100 mg total) by mouth 3 (three) times daily.   guaiFENesin 100 MG/5ML liquid Commonly known as: ROBITUSSIN Take 5 mLs by mouth every 4 (four) hours as needed for cough or to loosen phlegm.   insulin aspart 100 UNIT/ML injection Commonly known as: novoLOG Inject 0-6 Units into the skin 3 (three) times daily with meals.   insulin detemir 100 UNIT/ML injection Commonly known as: LEVEMIR Inject 0.06 mLs (6 Units total) into the skin daily. Start taking on: January 19, 2022   ipratropium-albuterol 0.5-2.5 (3) MG/3ML Soln Commonly known as: DUONEB Take 3 mLs by nebulization every 6 (six) hours as needed.  metoprolol tartrate 25 MG tablet Commonly known as: LOPRESSOR Take 0.5 tablets (12.5 mg total) by mouth 2 (two) times daily.   multivitamin with minerals Tabs tablet Take 1 tablet by mouth daily. Start taking on: January 19, 2022   ondansetron 4 MG tablet Commonly known as: ZOFRAN Take 1 tablet (4 mg total) by mouth every 6 (six) hours as needed for nausea.   pantoprazole 40 MG tablet Commonly known as: PROTONIX Take 1 tablet (40 mg total) by mouth 2 (two) times daily. What changed:  when to take this reasons to take this   Pirfenidone 267 MG Tabs Commonly known as: Esbriet Take 2 tablets (534 mg total) by mouth in the morning, at noon, and at bedtime. **LOW DOSE AS MAINTENANCE** What changed: how much to take   polyethylene glycol 17 g packet Commonly known as:  MIRALAX / GLYCOLAX Take 17 g by mouth 2 (two) times daily.   predniSONE 20 MG tablet Commonly known as: DELTASONE Take 2 tablets (40 mg total) by mouth daily with breakfast. Start taking on: January 19, 2022   predniSONE 10 MG tablet Commonly known as: DELTASONE Take 3 tablets (30 mg total) by mouth daily with breakfast. Start taking on: January 29, 2022   predniSONE 20 MG tablet Commonly known as: DELTASONE Take 1 tablet (20 mg total) by mouth daily with breakfast. Start taking on: February 12, 2022   predniSONE 10 MG tablet Commonly known as: DELTASONE Take 1 tablet (10 mg total) by mouth daily with breakfast. Start taking on: February 26, 2022   predniSONE 5 MG tablet Commonly known as: DELTASONE Take 1 tablet (5 mg total) by mouth daily with breakfast. Start taking on: March 12, 2022   rosuvastatin 10 MG tablet Commonly known as: CRESTOR Take 10 mg by mouth at bedtime.   senna 8.6 MG Tabs tablet Commonly known as: SENOKOT Take 2 tablets (17.2 mg total) by mouth daily. Start taking on: January 19, 2022   sulfamethoxazole-trimethoprim 800-160 MG tablet Commonly known as: BACTRIM DS Take 1 tablet by mouth 3 (three) times a week. Start taking on: January 21, 2022   tamsulosin 0.4 MG Caps capsule Commonly known as: FLOMAX Take 1 capsule (0.4 mg total) by mouth daily after supper.        Allergies  Allergen Reactions   Beef-Derived Products    Chicken Protein    Eggs Or Egg-Derived Products    Fish-Derived Products    Meat Extract     vegetarian   Ofev [Nintedanib] Other (See Comments)    Weight loss   Pork-Derived Products     Consultations: PCCM [place]   Procedures/Studies: DG CHEST PORT 1 VIEW  Result Date: 01/10/2022 CLINICAL DATA:  COVID positive. Shortness of breath, tachycardia and hypoxia. EXAM: PORTABLE CHEST 1 VIEW COMPARISON:  Chest x-ray 01/04/2022 and chest CT 01/08/2022 FINDINGS: The cardiac silhouette, mediastinal and hilar  contours are stable. Persistent diffuse changes of interstitial pneumonitis, likely viral/COVID pneumonia. No definite pleural effusions. No pneumothorax. IMPRESSION: Persistent changes of viral/COVID pneumonia. Electronically Signed   By: Marijo Sanes M.D.   On: 01/10/2022 14:39   DG Abd Portable 1V  Result Date: 01/09/2022 CLINICAL DATA:  Abdominal distension, constipation. EXAM: PORTABLE ABDOMEN - 1 VIEW COMPARISON:  None Available. FINDINGS: Amount of residual contrast is noted in the nondilated colon. No abnormal bowel dilatation is noted. No significant stool burden is noted. Status post prostatic brachytherapy seed placement. IMPRESSION: No abnormal bowel dilatation. Electronically Signed   By: Jeneen Rinks  Murlean Caller M.D.   On: 01/09/2022 15:38   CT Angio Chest Pulmonary Embolism (PE) W or WO Contrast  Result Date: 01/08/2022 CLINICAL DATA:  Chest pain, shortness of breath, pleurisy or effusion suspected; COVID positive EXAM: CT ANGIOGRAPHY CHEST WITH CONTRAST TECHNIQUE: Multidetector CT imaging of the chest was performed using the standard protocol during bolus administration of intravenous contrast. Multiplanar CT image reconstructions and MIPs were obtained to evaluate the vascular anatomy. RADIATION DOSE REDUCTION: This exam was performed according to the departmental dose-optimization program which includes automated exposure control, adjustment of the mA and/or kV according to patient size and/or use of iterative reconstruction technique. CONTRAST:  51mL OMNIPAQUE IOHEXOL 350 MG/ML SOLN COMPARISON:  CT 12/31/2021 FINDINGS: Cardiovascular: Satisfactory opacification of the pulmonary arteries to the segmental level. No evidence of pulmonary embolism. Normal heart size. No pericardial effusion. Aortic and coronary artery atherosclerotic calcification. Ascending thoracic aortic aneurysm measuring 42 mm in diameter. Mediastinum/Nodes: Decreased hilar and mediastinal lymph nodes since 12/31/2021.  Unremarkable trachea and esophagus. Lungs/Pleura: Since 12/31/2021 there is increased ground-glass opacities on a background of marked interstitial lung disease. For example the superior segment of the left lower lobe was previously well aerated and now demonstrates ground-glass opacification. Additional increasing ground-glass opacities in the inferior right middle and lower lobes. No pleural effusion or pneumothorax. Upper Abdomen: Nonobstructing stone in the upper right kidney versus parenchymal calcification. No acute abnormality. Musculoskeletal: No acute osseous abnormality. Review of the MIP images confirms the above findings. IMPRESSION: 1. Negative for acute pulmonary embolism. 2. Mildly increased bilateral ground-glass opacities on a background of interstitial lung disease concerning for worsening pneumonia. 3. Ascending aortic aneurysm measuring 42 mm. This is unchanged from 12/08/2020. Recommend annual imaging followup by CTA or MRA. This recommendation follows 2010 ACCF/AHA/AATS/ACR/ASA/SCA/SCAI/SIR/STS/SVM Guidelines for the diagnosis and Management of Patients with Thoracic Aortic Disease. circulation. 2010; 121: Q333-L456. Aortic aneurysm NOS (ICD10-I71.9) 4.  Aortic Atherosclerosis (ICD10-I70.0). Electronically Signed   By: Placido Sou M.D.   On: 01/08/2022 22:28   DG Swallowing Func-Speech Pathology  Result Date: 01/07/2022 Table formatting from the original result was not included. Images from the original result were not included. Objective Swallowing Evaluation: Type of Study: MBS-Modified Barium Swallow Study  Patient Details Name: NYSHAUN STANDAGE MRN: [id number] Date of Birth: 1941-01-07 Today's Date: 01/07/2022 Time: SLP Start Time (ACUTE ONLY): 1408 -SLP Stop Time (ACUTE ONLY): 1425 SLP Time Calculation (min) (ACUTE ONLY): 17 min Past Medical History: Past Medical History: Diagnosis Date  Back pain   Elevated PSA   Essential tremor   Hypercholesteremia   Neuropathy   Prostate  cancer ([place])   Sinus tachycardia 01/30/2018 Past Surgical History: Past Surgical History: Procedure Laterality Date  COLONOSCOPY WITH PROPOFOL N/A 08/10/2021  Procedure: COLONOSCOPY WITH PROPOFOL;  Surgeon: Carol Ada, MD;  Location: [facility];  Service: Gastroenterology;  Laterality: N/A;  ESOPHAGEAL DILATION  08/10/2021  Procedure: ESOPHAGEAL DILATION;  Surgeon: Carol Ada, MD;  Location: [facility];  Service: Gastroenterology;;  ESOPHAGOGASTRODUODENOSCOPY (EGD) WITH PROPOFOL N/A 08/10/2021  Procedure: ESOPHAGOGASTRODUODENOSCOPY (EGD) WITH PROPOFOL;  Surgeon: Carol Ada, MD;  Location: [facility];  Service: Gastroenterology;  Laterality: N/A;  HEMOSTASIS CLIP PLACEMENT  08/10/2021  Procedure: HEMOSTASIS CLIP PLACEMENT;  Surgeon: Carol Ada, MD;  Location: [facility];  Service: Gastroenterology;;  LUMBAR MICRODISCECTOMY    L4-5  POLYPECTOMY  08/10/2021  Procedure: POLYPECTOMY;  Surgeon: Carol Ada, MD;  Location: [facility];  Service: Gastroenterology;;  RADIOACTIVE SEED IMPLANT N/A 02/24/2018  Procedure: RADIOACTIVE SEED IMPLANT/BRACHYTHERAPY IMPLANT;  Surgeon:  Jerilee Field, MD;  Location: [facility];  Service: Urology;  Laterality: N/A;  RIGHT HEART CATH N/A 11/22/2021  Procedure: RIGHT HEART CATH;  Surgeon: Lennette Bihari, MD;  Location: [facility]AS[facility];  Service: Cardiovascular;  Laterality: N/A;  SPACE OAR INSTILLATION N/A 02/24/2018  Procedure: SPACE OAR INSTILLATION;  Surgeon: Jerilee Field, MD;  Location: [facility];  Service: Urology;  Laterality: N/A; HPI: Laurens G Deahl presented [facility] ED on 12/30/21 with hypoxemia, fever, and cough that started on 8/28.  Dx'd with IPF flare in setting of COVID 19 and rhinovirus. Respiratory distress with intubation 9/5-8. PMHx of esophageal stenosis s/p dilation 08/09/2021. There was one benign-appearing, intrinsic mild stenosis found at the cricopharyngeus as well as multiple strictures at the UES.  Esophagram 08/03/21: suspected cervical web, mild esophageal dysmotility, presbyesophagus, mild vallecular retention. PMHx includes idiopathic pulmonary fibrosis, essential tremor, neuropathy. Supportive family.  Subjective: alert and conversational  Recommendations for follow up therapy are one component of a multi-disciplinary discharge planning process, led by the attending physician.  Recommendations may be updated based on patient status, additional functional criteria and insurance authorization. Assessment / Plan / Recommendation   01/07/2022   1:00 PM Clinical Impressions Clinical Impression Mr.Klemp demonstrated a primary esophageal dysphagia and mild pharyngeal dysphagia.  Oral phase was WNL- pt tended to piecemeal solid boluses into pharynx, likely a behavioral strategy he has developed out of caution.  There was decreased base of tongue-to-pharyngeal wall compression, leading to residue that remained in the valleculae (solids >liquids) but cleared with secondary dry swallows. Laryngeal vestibule closure was reliable and consistent with no observed penetration nor aspiration.  A 13 mm pill  lodged briefly in the vallecular space, then above the UES.  It passed through the UES into the cervical esophagus, then returned to the hypopharynx.  A pureed bolus helped pass it through the esophagus without incident  The pt viewed the MBS in real time and we discussed the findings.  Recommend advancing diet to regular consistencies (avoid tough meats/breads or large boluses that may not pass through UES); continue thin liquds.  Crush pills >10 mm when possible. Pt will f/u with GI to determine if another dilation is warranted.  No further SLP f/u is needed - our service will sign off. SLP Visit Diagnosis Dysphagia, pharyngoesophageal phase (R13.14) Impact on safety and function No limitations     01/07/2022   1:00 PM Treatment Recommendations Treatment Recommendations No treatment recommended at this time      01/05/2022  10:00 AM Prognosis Prognosis for Safe Diet Advancement Good   01/07/2022   1:00 PM Diet Recommendations SLP Diet Recommendations Thin liquid;Regular solids Liquid Administration via Cup;Straw Medication Administration Crushed with puree Postural Changes Remain semi-upright after after feeds/meals (Comment)     01/07/2022   1:00 PM Other Recommendations Recommended Consults Consider esophageal assessment Oral Care Recommendations Oral care BID Follow Up Recommendations No SLP follow up Assistance recommended at discharge Intermittent Supervision/Assistance   01/05/2022  10:00 AM Frequency and Duration  Speech Therapy Frequency (ACUTE ONLY) min 2x/week Treatment Duration 2 weeks      No data to display       01/07/2022   1:00 PM Pharyngeal Phase Pharyngeal Phase Impaired Pharyngeal- Thin Straw Delayed swallow initiation-pyriform sinuses;Delayed swallow initiation-vallecula;Reduced tongue base retraction;Pharyngeal residue - valleculae Pharyngeal- Puree Delayed swallow initiation-vallecula;Reduced tongue base retraction;Pharyngeal residue - valleculae Pharyngeal- Regular Delayed swallow initiation-vallecula;Reduced tongue base retraction;Pharyngeal residue - valleculae    01/07/2022  1:00 PM Cervical Esophageal Phase  Cervical Esophageal Phase Impaired Juan Quam Laurice 01/07/2022, 3:07 PM                     MR Lumbar Spine W Wo Contrast  Result Date: 01/06/2022 CLINICAL DATA:  Low back pain; history of laminectomy EXAM: MRI LUMBAR SPINE WITHOUT AND WITH CONTRAST TECHNIQUE: Multiplanar and multiecho pulse sequences of the lumbar spine were obtained without and with intravenous contrast. CONTRAST:  8mL GADAVIST GADOBUTROL 1 MMOL/ML IV SOLN COMPARISON:  06/22/2016 MRI lumbar spine FINDINGS: Segmentation:  5 lumbar-type vertebral bodies. Alignment: Reversal of the normal lumbar lordosis. Mild lumbar levocurvature. Trace retrolisthesis L4 on L5. Vertebrae: No acute fracture or suspicious osseous lesions.  Increased T2 signal and contrast enhancement at the superior aspect of L4, which appears concentrated around the superior endplate, with increased T2 signal in the adjacent disc (series 4, image 10 and series 8, image 10). No abnormal enhancement within the disc. Small focus of similar signal at the inferior endplate of L3. Conus medullaris and cauda equina: Conus extends to the L1 level. Conus and cauda equina appear normal. No abnormal enhancement. Paraspinal and other soft tissues: Atrophy of the right-greater-than-left inferior paraspinous muscles. Disc levels: T12-L1: No significant disc bulge. No spinal canal stenosis or neural foraminal narrowing. L1-L2: Minimal disc bulge. Mild facet arthropathy. No spinal canal stenosis or neural foraminal narrowing. L2-L3: Minimal disc bulge. Mild facet arthropathy. No spinal canal stenosis or neural foraminal narrowing. L3-L4: Mild disc bulge. Mild-to-moderate facet arthropathy. Narrowing of the lateral recesses, which could affect the descending L4 nerve roots. Mild spinal canal stenosis, unchanged. No neural foraminal narrowing. L4-L5: Interval right laminectomy. Trace retrolisthesis and mild disc bulge. Moderate facet arthropathy. No residual spinal canal stenosis. Narrowing of the lateral recesses, which could affect the descending L5 nerve roots. Mild bilateral neural foraminal narrowing, unchanged. L5-S1: Mild disc bulge with right paracentral disc extrusion with caudal migration, new from the prior exam, which effaces the right lateral recess and contacts the descending right S1 nerve roots (series 6, image 41). Moderate to severe facet arthropathy. Narrowing of the lateral recesses, which could compress the descending S1 nerve roots. No spinal canal stenosis. Mild bilateral neural foraminal narrowing, unchanged. IMPRESSION: 1. L5-S1 right paracentral disc extrusion with caudal migration, new from the prior exam, which effaces the right lateral recess and contacts  the descending right S1 nerve roots. 2. Edema and enhancement at the superior aspect of L4, which appears concentrated about the superior endplate, with edema in the intervertebral disc, and a smaller focus of similar abnormal signal at the inferior endplate of L3. These findings are nonspecific and can be seen in the setting of degenerative changes/acute Schmorl's node formation, which can be painful, although early discitis-osteomyelitis can appear similar. Correlate with inflammatory markers. 3. L3-L4 mild spinal canal stenosis, unchanged. 4. L4-L5 and L5-S1 mild bilateral neural foraminal narrowing, unchanged. Electronically Signed   By: Merilyn Baba M.D.   On: 01/06/2022 21:04   DG CHEST PORT 1 VIEW  Result Date: 01/04/2022 CLINICAL DATA:  Dyspnea EXAM: PORTABLE CHEST 1 VIEW COMPARISON:  Radiographs January 03, 2022 FINDINGS: Endotracheal tube with tip in the upper thoracic trachea. Enteric tube courses below the diaphragm tip projecting over the stomach. Right upper extremity PICC with tip projecting over the superior cavoatrial junction. The heart size and mediastinal contours are unchanged. Diffuse interstitial opacities with patchy airspace opacities are similar to recent prior examination. Probable small left pleural effusion. No  visible pneumothorax. No acute osseous abnormality. IMPRESSION: 1. Similar diffuse interstitial opacities and patchy airspace opacities 2. New probable small left pleural effusion. Electronically Signed   By: Dahlia Bailiff M.D.   On: 01/04/2022 08:29   DG CHEST PORT 1 VIEW  Result Date: 01/03/2022 CLINICAL DATA:  ET tube present.  COVID positive.  Hypoxic. EXAM: PORTABLE CHEST 1 VIEW COMPARISON:  Chest radiograph 01/02/2022 and earlier. FINDINGS: Endotracheal tube tip projects 9.1 cm above the carina. Enteric tube tip is below the diaphragm but not included on the image. Right PICC tip projects just above the level of the superior cavoatrial junction. Diffuse interstitial  opacities, not significantly changed compared to yesterday's exam but improved compared to 01/01/2022. No new focal consolidation, pleural effusion, or pneumothorax. IMPRESSION: Support lines and tubes are stable. Moderate diffuse airspace disease is similar to yesterday's exam, in keeping with history of viral infection. No new focal consolidation. Electronically Signed   By: Ileana Roup M.D.   On: 01/03/2022 08:34   ECHOCARDIOGRAM COMPLETE  Result Date: 01/02/2022    ECHOCARDIOGRAM REPORT   Patient Name:   FILEMON BRETON Date of Exam: 01/02/2022 Medical Rec #:  [id number]             Height:       66.0 in Accession #:    [id number]            Weight:       130.1 lb Date of Birth:  Jul 07, 1940              BSA:          1.666 m Patient Age:    32 years              BP:           112/76 mmHg Patient Gender: M                     HR:           80 bpm. Exam Location:  Inpatient Procedure: 2D Echo Indications:    Abnormal ECG  History:        Patient has prior history of Echocardiogram examinations, most                 recent 10/10/2021. PAD, Arrythmias:Tachycardia;                 Signs/Symptoms:Shortness of Breath.  Sonographer:    Harvie Junior Referring Phys: [id number] [facility]  Sonographer Comments: Unable to turn - patient supine. IMPRESSIONS  1. Left ventricular ejection fraction, by estimation, is 55 to 60%. The left ventricle has normal function. The left ventricle has no regional wall motion abnormalities. Left ventricular diastolic parameters are indeterminate.  2. Right ventricular systolic function is normal. The right ventricular size is normal. There is moderately elevated pulmonary artery systolic pressure.  3. The mitral valve is normal in structure. No evidence of mitral valve regurgitation. No evidence of mitral stenosis.  4. Tricuspid valve regurgitation is moderate.  5. The aortic valve is normal in structure. Aortic valve regurgitation is not visualized. No aortic stenosis is present.  6.  The inferior vena cava is normal in size with greater than 50% respiratory variability, suggesting right atrial pressure of 3 mmHg. FINDINGS  Left Ventricle: Left ventricular ejection fraction, by estimation, is 55 to 60%. The left ventricle has normal function. The left ventricle has no regional wall motion abnormalities. The left ventricular internal cavity  size was normal in size. There is  no left ventricular hypertrophy. Left ventricular diastolic parameters are indeterminate. Right Ventricle: The right ventricular size is normal. No increase in right ventricular wall thickness. Right ventricular systolic function is normal. There is moderately elevated pulmonary artery systolic pressure. The tricuspid regurgitant velocity is 2.97 m/s, and with an assumed right atrial pressure of 15 mmHg, the estimated right ventricular systolic pressure is 49.7 mmHg. Left Atrium: Left atrial size was normal in size. Right Atrium: Right atrial size was normal in size. Pericardium: There is no evidence of pericardial effusion. Mitral Valve: The mitral valve is normal in structure. No evidence of mitral valve regurgitation. No evidence of mitral valve stenosis. Tricuspid Valve: The tricuspid valve is normal in structure. Tricuspid valve regurgitation is moderate . No evidence of tricuspid stenosis. Aortic Valve: The aortic valve is normal in structure. Aortic valve regurgitation is not visualized. No aortic stenosis is present. Aortic valve mean gradient measures 3.0 mmHg. Aortic valve peak gradient measures 6.4 mmHg. Aortic valve area, by VTI measures 2.47 cm. Pulmonic Valve: The pulmonic valve was normal in structure. Pulmonic valve regurgitation is not visualized. No evidence of pulmonic stenosis. Aorta: The aortic root is normal in size and structure. Venous: The inferior vena cava is normal in size with greater than 50% respiratory variability, suggesting right atrial pressure of 3 mmHg. IAS/Shunts: No atrial level shunt  detected by color flow Doppler.  LEFT VENTRICLE PLAX 2D LVIDd:         4.30 cm     Diastology LVIDs:         3.00 cm     LV e' medial:    5.22 cm/s LV PW:         0.80 cm     LV E/e' medial:  16.0 LV IVS:        0.80 cm     LV e' lateral:   7.40 cm/s LVOT diam:     2.00 cm     LV E/e' lateral: 11.3 LV SV:         56 LV SV Index:   34 LVOT Area:     3.14 cm  LV Volumes (MOD) LV vol d, MOD A2C: 55.7 ml LV vol d, MOD A4C: 51.6 ml LV vol s, MOD A2C: 22.5 ml LV vol s, MOD A4C: 22.8 ml LV SV MOD A2C:     33.2 ml LV SV MOD A4C:     51.6 ml LV SV MOD BP:      33.7 ml RIGHT VENTRICLE RV Basal diam:  3.80 cm RV Mid diam:    3.20 cm RV S prime:     10.70 cm/s TAPSE (M-mode): 2.4 cm LEFT ATRIUM             Index        RIGHT ATRIUM           Index LA diam:        2.50 cm 1.50 cm/m   RA Area:     15.70 cm LA Vol (A2C):   22.5 ml 13.51 ml/m  RA Volume:   39.90 ml  23.95 ml/m LA Vol (A4C):   14.4 ml 8.65 ml/m LA Biplane Vol: 19.3 ml 11.59 ml/m  AORTIC VALVE                    PULMONIC VALVE AV Area (Vmax):    2.18 cm     PV Vmax:  1.00 m/s AV Area (Vmean):   2.01 cm     PV Peak grad:     4.0 mmHg AV Area (VTI):     2.47 cm     PR End Diast Vel: 7.84 msec AV Vmax:           126.00 cm/s AV Vmean:          88.700 cm/s AV VTI:            0.228 m AV Peak Grad:      6.4 mmHg AV Mean Grad:      3.0 mmHg LVOT Vmax:         87.50 cm/s LVOT Vmean:        56.800 cm/s LVOT VTI:          0.179 m LVOT/AV VTI ratio: 0.79  AORTA Ao Root diam: 3.40 cm Ao Asc diam:  3.30 cm MITRAL VALVE               TRICUSPID VALVE MV Area (PHT): 3.05 cm    TR Peak grad:   35.3 mmHg MV Decel Time: 249 msec    TR Mean grad:   23.0 mmHg MV E velocity: 83.60 cm/s  TR Vmax:        297.00 cm/s MV A velocity: 95.10 cm/s  TR Vmean:       230.0 cm/s MV E/A ratio:  0.88                            SHUNTS                            Systemic VTI:  0.18 m                            Systemic Diam: 2.00 cm Kardie Tobb DO Electronically signed by Berniece Salines DO  Signature Date/Time: 01/02/2022/2:05:25 PM    Final    DG CHEST PORT 1 VIEW  Result Date: 01/02/2022 CLINICAL DATA:  Respiratory failure.  COVID infection. EXAM: PORTABLE CHEST 1 VIEW COMPARISON:  Radiographs 01/01/2022 and 12/30/2021.  CT 12/31/2021. FINDINGS: 0555 hours. A new right arm PICC projects to the level of the mid SVC. Endotracheal and enteric tubes appear unchanged and satisfactorily positioned. The heart size and mediastinal contours are stable. Diffuse left greater than right interstitial pulmonary opacities are again noted, minimally improved from yesterday. No pneumothorax or significant pleural effusion identified. IMPRESSION: Satisfactory position of the support system. Minimal improvement in diffuse interstitial pulmonary opacities consistent with viral infection. Electronically Signed   By: Richardean Sale M.D.   On: 01/02/2022 08:35   DG CHEST PORT 1 VIEW  Result Date: 01/01/2022 CLINICAL DATA:  Ventilator dependence. EXAM: PORTABLE CHEST 1 VIEW COMPARISON:  Earlier same day FINDINGS: 0531 hours. Endotracheal tube tip is 6.7 cm above the base of the carina. NG tube noted with distal tip positioned in the stomach. Of relatively diffuse mid and lower lung airspace disease seen previously is similar to mildly improved in the interval. Telemetry leads overlie the chest. IMPRESSION: 1. Endotracheal tube tip 6.7 cm above the base of the carina. 2. Similar to mildly improved bilateral mid and lower lung airspace disease. Electronically Signed   By: Misty Stanley M.D.   On: 01/01/2022 06:10   [place] EKG SITE RITE  Result Date: 01/01/2022 If Site [employer]  not attached, placement could not be confirmed due to current cardiac rhythm.  DG Chest Port 1 View  Result Date: 01/01/2022 CLINICAL DATA:  Acute respiratory distress EXAM: PORTABLE CHEST 1 VIEW COMPARISON:  12/30/2021 FINDINGS: Pulmonary insufflation is stable. Extensive airspace infiltrate has progressed and is now seen diffusely throughout  the lungs, more severe within the left mid lung zone. In the acute setting, this can be seen in the setting of pulmonary hemorrhage, rapidly progressive pneumonic infiltrate, or alveolar pulmonary edema. No pneumothorax or pleural effusion. Cardiac size within normal limits. No acute bone abnormality. IMPRESSION: Interval progression of diffuse airspace infiltrate, more severe within the left mid lung zone. Electronically Signed   By: Fidela Salisbury M.D.   On: 01/01/2022 03:45   VAS [place] LOWER EXTREMITY VENOUS (DVT)  Result Date: 12/31/2021  Lower Venous DVT Study Patient Name:  HADEN SUDER  Date of Exam:   12/31/2021 Medical Rec #: [id number]              Accession #:    [id number] Date of Birth: Sep 04, 1940               Patient Gender: M Patient Age:   62 years Exam Location:  [facility] Procedure:      VAS [place] LOWER EXTREMITY VENOUS (DVT) Referring Phys: Brand Males --------------------------------------------------------------------------------  Indications: Covid-19, d-dimer.  Comparison Study: No prior studies. Performing Technologist: Darlin Coco RDMS, RVT  Examination Guidelines: A complete evaluation includes B-mode imaging, spectral Doppler, color Doppler, and power Doppler as needed of all accessible portions of each vessel. Bilateral testing is considered an integral part of a complete examination. Limited examinations for reoccurring indications may be performed as noted. The reflux portion of the exam is performed with the patient in reverse Trendelenburg.  +---------+---------------+---------+-----------+----------+--------------+ RIGHT    CompressibilityPhasicitySpontaneityPropertiesThrombus Aging +---------+---------------+---------+-----------+----------+--------------+ CFV      Full           Yes      Yes                                 +---------+---------------+---------+-----------+----------+--------------+ SFJ      Full                                                         +---------+---------------+---------+-----------+----------+--------------+ FV Prox  Full                                                        +---------+---------------+---------+-----------+----------+--------------+ FV Mid   Full                                                        +---------+---------------+---------+-----------+----------+--------------+ FV DistalFull                                                        +---------+---------------+---------+-----------+----------+--------------+  PFV      Full                                                        +---------+---------------+---------+-----------+----------+--------------+ POP      Full           Yes      Yes                                 +---------+---------------+---------+-----------+----------+--------------+ PTV      Full                                                        +---------+---------------+---------+-----------+----------+--------------+ PERO     Full                                                        +---------+---------------+---------+-----------+----------+--------------+   +---------+---------------+---------+-----------+----------+--------------+ LEFT     CompressibilityPhasicitySpontaneityPropertiesThrombus Aging +---------+---------------+---------+-----------+----------+--------------+ CFV      Full           Yes      Yes                                 +---------+---------------+---------+-----------+----------+--------------+ SFJ      Full                                                        +---------+---------------+---------+-----------+----------+--------------+ FV Prox  Full                                                        +---------+---------------+---------+-----------+----------+--------------+ FV Mid   Full                                                         +---------+---------------+---------+-----------+----------+--------------+ FV DistalFull                                                        +---------+---------------+---------+-----------+----------+--------------+ PFV      Full                                                        +---------+---------------+---------+-----------+----------+--------------+  POP      Full           Yes      Yes                                 +---------+---------------+---------+-----------+----------+--------------+ PTV      Full                                                        +---------+---------------+---------+-----------+----------+--------------+ PERO     Full                                                        +---------+---------------+---------+-----------+----------+--------------+     Summary: RIGHT: - There is no evidence of deep vein thrombosis in the lower extremity.  - No cystic structure found in the popliteal fossa.  LEFT: - There is no evidence of deep vein thrombosis in the lower extremity.  - No cystic structure found in the popliteal fossa.  *See table(s) above for measurements and observations. Electronically signed by Monica Martinez MD on 12/31/2021 at 1:28:37 PM.    Final    CT CHEST W CONTRAST  Result Date: 12/31/2021 CLINICAL DATA:  Pneumonia. EXAM: CT CHEST WITH CONTRAST TECHNIQUE: Multidetector CT imaging of the chest was performed during intravenous contrast administration. RADIATION DOSE REDUCTION: This exam was performed according to the departmental dose-optimization program which includes automated exposure control, adjustment of the mA and/or kV according to patient size and/or use of iterative reconstruction technique. CONTRAST:  3mL OMNIPAQUE IOHEXOL 300 MG/ML  SOLN COMPARISON:  Chest radiograph dated 12/30/2021 and CT dated 12/18/2018. FINDINGS: Cardiovascular: Mild cardiomegaly. No pericardial effusion. Top-normal ascending aorta measure up  to 4 cm in diameter. No dissection. There is mild atherosclerotic calcification of the thoracic aorta. No pulmonary artery embolus identified. Mediastinum/Nodes: Mildly enlarged bilateral hilar and mediastinal lymph nodes, reactive. The esophagus is grossly unremarkable. No mediastinal fluid collection. Lungs/Pleura: Background of interstitial lung disease. Diffuse ground-glass opacity throughout the left lung, new since the prior CT most consistent with pneumonia. No pleural effusion or pneumothorax. The central airways are patent. Upper Abdomen: Fatty liver. A 5 mm nonobstructing right renal pole calculus. Small left renal upper pole cyst. Musculoskeletal: Degenerative changes of the spine. No acute osseous pathology. IMPRESSION: 1. No CT evidence of pulmonary artery embolus. 2. Background of interstitial lung disease with interval development of diffuse left lung opacities consistent with pneumonia. 3. Mildly enlarged bilateral hilar and mediastinal lymph nodes, reactive. 4. Fatty liver. 5. Nonobstructing right renal pole calculus. 6. Aortic Atherosclerosis (ICD10-I70.0). Electronically Signed   By: Anner Crete M.D.   On: 12/31/2021 02:16   DG Chest Port 1 View  Result Date: 12/30/2021 CLINICAL DATA:  cough, sob, known IPF, ? covid EXAM: PORTABLE CHEST 1 VIEW COMPARISON:  CT chest 12/17/2021, chest x-ray 01/30/2018 FINDINGS: Left lung border not visualized due to overlying lung findings. The heart and mediastinal contours are otherwise unchanged. Atherosclerotic plaque. Interval development of left lung diffuse hazy airspace opacity with slight aeration of the left apex. Chronic coarsened markings with no overt pulmonary edema. No  pleural effusion. No pneumothorax. No acute osseous abnormality. IMPRESSION: 1. Interval development of left lung diffuse hazy airspace opacity with slight aeration of the left apex. Underlying pulmonary fibrosis again noted. Recommend CT chest with intravenous contrast for  further evaluation. 2.  Aortic Atherosclerosis (ICD10-I70.0). Electronically Signed   By: Iven Finn M.D.   On: 12/30/2021 22:32      Subjective:  No significant events overnight, patient denies any complaints today. Discharge Exam: Vitals:   01/18/22 0332 01/18/22 0816  BP: 119/75 (!) 118/51  Pulse: 71 90  Resp: 20 20  Temp: 98.2 F (36.8 C) 97.8 F (36.6 C)  SpO2: 99% 91%   Vitals:   01/17/22 2313 01/18/22 0332 01/18/22 0500 01/18/22 0816  BP: 105/62 119/75  (!) 118/51  Pulse: 89 71  90  Resp: $Remo'20 20  20  'TKMSQ$ Temp: 98.2 F (36.8 C) 98.2 F (36.8 C)  97.8 F (36.6 C)  TempSrc: Oral Oral  Oral  SpO2: 100% 99%  91%  Weight:   57.6 kg   Height:        General: Pt is alert, awake, not in acute distress,frail Cardiovascular: RRR, S1/S2 +, no rubs, no gallops Respiratory: CTA bilaterally,somerhonchi Abdominal: Soft, NT, ND, bowel sounds + Extremities: no edema, no cyanosis    The results of significant diagnostics from this hospitalization (including imaging, microbiology, ancillary and laboratory) are listed below for reference.     Microbiology: No results found for this or any previous visit (from the past 240 hour(s)).   Labs: BNP (last 3 results) No results for input(s): "BNP" in the last 8760 hours. Basic Metabolic Panel: Recent Labs  Lab 01/12/22 0727 01/13/22 0412 01/14/22 0341 01/15/22 0244 01/16/22 0601  NA 135 138 135 136 140  K 4.3 4.1 3.6 3.9 3.6  CL 99 103 100 102 102  CO2 $Re'26 31 29 29 29  'zra$ GLUCOSE 78 81 105* 110* 88  BUN 28* $Remov'23 20 18 13  'enHIhO$ CREATININE 0.89 0.95 0.87 0.85 0.89  CALCIUM 8.4* 8.5* 8.3* 8.6* 9.1  MG 1.9 2.1 2.1 2.0 1.9  PHOS 2.3* 2.7 2.4* 2.4*  --    Liver Function Tests: Recent Labs  Lab 01/15/22 0244 01/16/22 0601  AST 40 36  ALT 109* 115*  ALKPHOS 54 63  BILITOT 0.6 0.9  PROT 4.8* 5.1*  ALBUMIN 2.6* 2.7*   No results for input(s): "LIPASE", "AMYLASE" in the last 168 hours. No results for input(s): "AMMONIA" in the  last 168 hours. CBC: Recent Labs  Lab 01/12/22 0727 01/13/22 0412 01/14/22 0341 01/15/22 0244 01/16/22 0601  WBC 17.4* 13.9* 11.9* 10.8* 11.0*  NEUTROABS 14.8* 11.8* 10.0* 8.8* 8.8*  HGB 11.0* 11.0* 10.6* 10.8* 11.4*  HCT 31.7* 31.9* 31.6* 31.6* 33.0*  MCV 95.5 95.8 95.8 96.3 95.7  PLT 182 186 199 185 183   Cardiac Enzymes: No results for input(s): "CKTOTAL", "CKMB", "CKMBINDEX", "TROPONINI" in the last 168 hours. BNP: Invalid input(s): "POCBNP" CBG: Recent Labs  Lab 01/17/22 0838 01/17/22 1235 01/17/22 1623 01/17/22 2103 01/18/22 0813  GLUCAP 91 137* 233* 265* 84   D-Dimer No results for input(s): "DDIMER" in the last 72 hours. Hgb A1c No results for input(s): "HGBA1C" in the last 72 hours. Lipid Profile No results for input(s): "CHOL", "HDL", "LDLCALC", "TRIG", "CHOLHDL", "LDLDIRECT" in the last 72 hours. Thyroid function studies No results for input(s): "TSH", "T4TOTAL", "T3FREE", "THYROIDAB" in the last 72 hours.  Invalid input(s): "FREET3" Anemia work up No results for input(s): "VITAMINB12", "FOLATE", "FERRITIN", "TIBC", "  IRON", "RETICCTPCT" in the last 72 hours. Urinalysis    Component Value Date/Time   COLORURINE YELLOW 12/30/2021 2203   APPEARANCEUR CLEAR 12/30/2021 2203   LABSPEC 1.006 12/30/2021 2203   PHURINE 7.0 12/30/2021 2203   GLUCOSEU NEGATIVE 12/30/2021 2203   HGBUR NEGATIVE 12/30/2021 2203   BILIRUBINUR NEGATIVE 12/30/2021 2203   KETONESUR NEGATIVE 12/30/2021 2203   PROTEINUR 30 (A) 12/30/2021 2203   NITRITE NEGATIVE 12/30/2021 2203   LEUKOCYTESUR NEGATIVE 12/30/2021 2203   Sepsis Labs Recent Labs  Lab 01/13/22 0412 01/14/22 0341 01/15/22 0244 01/16/22 0601  WBC 13.9* 11.9* 10.8* 11.0*   Microbiology No results found for this or any previous visit (from the past 240 hour(s)).   Time coordinating discharge: Over 30 minutes  SIGNED:   Phillips Climes, MD  [facility] 01/18/2022, 10:15 AM Pager   If 7PM-7AM, please  contact night-coverage [email or web address]

## 2022-01-18 NOTE — Progress Notes (Signed)
[name], male   DOB: [birth year], 81 y.o.   MRN: [id number]   Pt arrived to 4W20 per bed. Pt placed in recliner pt request. Vitals obtained, assessment completed. Pt oriented to rehab and policies reviewed. Pt in agreement. Pt call light in reach and bed in low position. Sheela Stack, LPN

## 2022-01-18 NOTE — Telephone Encounter (Signed)
NExt 3 months chances of repeat covid is very very very low -< 1-2% due to natural immunity. Re restart esbriet - if he is sawllowing ok and  I saw kidneys ok but albumin is low and No GI issues  So can restart esbriet at 1 pill tid  for 1 week- > then got to 2 pills tid and hold there at the lower dose  I am on night shift tonight and can leave message for the [facility] doc

## 2022-01-18 NOTE — H&P (Signed)
Physical Medicine and Rehabilitation Admission H&P     CC: Functional deficits due to debility from Covid/     HPI: Jeff Willis is an 81 year old male with history of IPF w/UIP on Esbriet, HTN, prostate CA, chronic LBP, esophageal web s/p dilatation X 2 who was admitted on 12/31/21 after hypoxia, tachycardia and low grade fever due to covid dx towards the end of his cruise. He was treated with 5 day course antibiotics as well as Remsdesivir, Steriods and Actemera. Chest CT negative for PE but showed Left > Right diffuse groundglass opacities. Respiratory status worsened requiring intubation  09/05- 09/08 as well as levophed due to  hypotension. He was extubated to 100% HFNC, on tube feeds for nutritional support and diet slowly advanced. He reported worsening of LBP and MRI spine done revealingL5/S1 SNP effacing right lateral recss and contacting right S1 nerve root, edema L4 and L3 --nonspecific, L3/L4 mild canal stenosis and L4/L5 and L5/S1 mild neural foraminal narrowing. Gabapentin added with improvement in pain control.    Elevated D dimer felt to be in setting of Covid as LE dopplers negative for DVT and CTA chest negative for acute PE.  MBS done without aspiration but 13 mm pill briefly lodges in vallecular space. Abdominal distension resolved with titration of laxatives and BS noted to be elevated due to steroids and Levemir added. FIO2 slowly being weaned down. On 09/14, he developed acute hypoxemic respiratory failure due to aspiration PNA/pneumonitis and was started on IV solumedrol with Unasyn. Respiratory status improved with treatment and he was weaned down to 2-4 L per [name]. Tachycardia resolved with addition of BB.    Dr. Gloriann Loan consulted 09/19 for urinary retention and recommended foley placement with addition of Flomax and voiding trial in a week. Solumedrol tapered to slow prednisone taper and home Esbriet to start at one bill TID for a week followed by 2 pills TID  starting today. He continues to be limited by weakness with frequent bouts of coughing, hypoxia with activity as well as deconditioning. CIR recommended due to functional decline.      Review of Systems  Constitutional:  Negative for fever.  HENT:  Negative for hearing loss.   Eyes:  Negative for blurred vision.  Respiratory:  Positive for cough and shortness of breath.   Cardiovascular:  Negative for chest pain.  Gastrointestinal:  Negative for nausea and vomiting.  Genitourinary:  Negative for dysuria.  Musculoskeletal:  Negative for myalgias.  Skin:  Negative for rash.  Neurological:  Positive for weakness.  Psychiatric/Behavioral:  Negative for depression.             Past Medical History:  Diagnosis Date   Back pain     Elevated PSA     Essential tremor     Hypercholesteremia     Neuropathy     Prostate cancer ([place])     Sinus tachycardia 01/30/2018           Past Surgical History:  Procedure Laterality Date   COLONOSCOPY WITH PROPOFOL N/A 08/10/2021    Procedure: COLONOSCOPY WITH PROPOFOL;  Surgeon: Carol Ada, MD;  Location: [facility];  Service: Gastroenterology;  Laterality: N/A;   ESOPHAGEAL DILATION   08/10/2021    Procedure: ESOPHAGEAL DILATION;  Surgeon: Carol Ada, MD;  Location: [facility];  Service: Gastroenterology;;   ESOPHAGOGASTRODUODENOSCOPY (EGD) WITH PROPOFOL N/A 08/10/2021    Procedure: ESOPHAGOGASTRODUODENOSCOPY (EGD) WITH PROPOFOL;  Surgeon: Carol Ada, MD;  Location: [facility];  Service: Gastroenterology;  Laterality: N/A;   HEMOSTASIS CLIP PLACEMENT   08/10/2021    Procedure: HEMOSTASIS CLIP PLACEMENT;  Surgeon: Carol Ada, MD;  Location: [facility];  Service: Gastroenterology;;   LUMBAR MICRODISCECTOMY        L4-5   POLYPECTOMY   08/10/2021    Procedure: POLYPECTOMY;  Surgeon: Carol Ada, MD;  Location: [facility];  Service: Gastroenterology;;   RADIOACTIVE SEED IMPLANT N/A 02/24/2018    Procedure: RADIOACTIVE SEED  IMPLANT/BRACHYTHERAPY IMPLANT;  Surgeon: Festus Aloe, MD;  [facility];  Service: Urology;  Laterality: N/A;   RIGHT HEART CATH N/A 11/22/2021    Procedure: RIGHT HEART CATH;  Surgeon: Troy Sine, MD;  Location: [facility];  Service: Cardiovascular;  Laterality: N/A;   SPACE OAR INSTILLATION N/A 02/24/2018    Procedure: SPACE OAR INSTILLATION;  Surgeon: Festus Aloe, MD;  Location: [facility];  Service: Urology;  Laterality: N/A;         Family History  Problem Relation Age of Onset   Other Mother          died at 21 of natural causes   Other Father          unsure of health   Cancer Neg Hx        Social History: Married. Retired Field seismologist. Independent and reasonably active PTA. He  reports that he has never smoked. He has never used smokeless tobacco. He reports that he does not drink alcohol and does not use drugs.          Allergies  Allergen Reactions   Beef-Derived Products     Chicken Protein     Eggs Or Egg-Derived Products     Fish-Derived Products     Meat Extract        vegetarian   Ofev [Nintedanib] Other (See Comments)      Weight loss   Pork-Derived Products              Medications Prior to Admission  Medication Sig Dispense Refill   aspirin EC 81 MG tablet Take 81 mg by mouth daily. Swallow whole.       metoprolol succinate (TOPROL XL) 25 MG 24 hr tablet Take 1 tablet (25 mg total) by mouth daily. 90 tablet 3   pantoprazole (PROTONIX) 40 MG tablet Take 40 mg by mouth daily as needed (heartburn).       Pirfenidone (ESBRIET) 267 MG TABS Take 2 tablets (534 mg total) by mouth in the morning, at noon, and at bedtime. **LOW DOSE AS MAINTENANCE** (Patient taking differently: Take 267 mg by mouth in the morning, at noon, and at bedtime. **LOW DOSE AS MAINTENANCE**) 540 tablet 1   rosuvastatin (CRESTOR) 10 MG tablet Take 10 mg by mouth at bedtime.              Home: Home Living Family/patient  expects to be discharged to:: Private residence Living Arrangements: Children, Spouse/significant other, Other relatives (He and his wife live with son, [name]) Available Help at Discharge: Family, Available 24 hours/day Type of Home: House Home Access: Stairs to enter [employer] of Steps: 2 Entrance Stairs-Rails: None Home Layout: Two level, Able to live on main level with bedroom/bathroom Bathroom Shower/Tub: Tub/shower unit, Architectural technologist: Standard Bathroom Accessibility: Yes Home Equipment: Grab bars - tub/shower Additional Comments: Just came back from cruise to [place]  Lives With: Family   Functional History: Prior Function Prior Level of Function : Independent/Modified  Independent, Driving Mobility Comments: No issues and was independent. ADLs Comments: I with ADL, iADL   Functional Status:  Mobility: Bed Mobility Overal bed mobility: Needs Assistance Bed Mobility: Supine to Sit, Sit to Supine Rolling: Modified independent (Device/Increase time) Sidelying to sit: Min guard (HOB flat) Supine to sit: Supervision Sit to supine: Min assist Sit to sidelying: Min assist General bed mobility comments: needing extra time, pt using bed rail. Transfers Overall transfer level: Needs assistance Equipment used: Rolling walker (2 wheels) Transfers: Sit to/from Stand Sit to Stand: Min guard Bed to/from chair/wheelchair/BSC transfer type:: Step pivot Step pivot transfers: Min assist, From elevated surface General transfer comment: increased time to perform, cues for safe UE placement and use of brakes using rollator Ambulation/Gait Ambulation/Gait assistance: Min assist, Min guard Gait Distance (Feet): 60 Feet (x2 with seated break btwn trials, also 66f with HHA) Assistive device: Rollator (4 wheels), 1 person hand held assist Gait Pattern/deviations: Step-to pattern, Narrow base of support, Step-through pattern, Decreased dorsiflexion - right, Decreased  dorsiflexion - left General Gait Details: cues for improved step length and heel strike with fair carryover. pt with increased WOB with exertion needing min cues for standing breaks and pursed-lip breathing. SpO2 desat to 87% on 2L O2 [place] so increased to 3L. On 3L remains mostly 89-91% wtih exertion and HR to 111 bpm, pt given cues for standing break and pursed lip breathing when SpO2 drops to 88%. Increased time to perform. Pt frequently coughing. HHA for short distance in room without AD, pt using rollator for hallway mobility Gait velocity: reduced Gait velocity interpretation: <1.31 ft/sec, indicative of household ambulator Pre-gait activities: standing hip flexion x10 reps Stairs: Yes Stairs assistance: Min assist Stair Management: One rail Right, Step to pattern, Forwards Number of Stairs: 3 General stair comments: single 7" platform step in room x 3 reps with ~2 minute standing recovery break between each attempt.   ADL: ADL Overall ADL's : Needs assistance/impaired Eating/Feeding: Independent, Sitting Grooming: Wash/dry hands, Wash/dry face, Min guard, Standing Upper Body Bathing: Minimal assistance, Sitting Lower Body Bathing: Minimal assistance, Sit to/from stand Upper Body Dressing : Minimal assistance, Sitting Lower Body Dressing: Minimal assistance, Moderate assistance, Sit to/from stand Toilet Transfer: Supervision/safety, Ambulation, Regular Toilet Toileting- Clothing Manipulation and Hygiene: Moderate assistance, Sit to/from stand Functional mobility during ADLs: Minimal assistance, Cueing for safety, Cueing for sequencing, Rolling walker (2 wheels) General ADL Comments: Patient progressing towards goals, patient limited by fatigue and decreased O2 with activity   Cognition: Cognition Overall Cognitive Status: Within Functional Limits for tasks assessed Orientation Level: Oriented X4 Cognition Arousal/Alertness: Awake/alert Behavior During Therapy: Flat affect Overall  Cognitive Status: Within Functional Limits for tasks assessed General Comments: Pleasant and cooperative   Blood pressure (!) 118/51, pulse 90, temperature 97.8 F (36.6 C), temperature source Oral, resp. rate 20, height '5\' 6"'$  (1.676 m), weight 57.6 kg, SpO2 91 %. Physical Exam Vitals and nursing note reviewed.  Constitutional:      Appearance: Normal appearance. He is underweight.  HENT:     Head: Normocephalic.     Right Ear: External ear normal.     Left Ear: External ear normal.     Nose: Nose normal.  Eyes:     Conjunctiva/sclera: Conjunctivae normal.     Pupils: Pupils are equal, round, and reactive to light.  Cardiovascular:     Rate and Rhythm: Normal rate and regular rhythm.     Heart sounds: No murmur heard.    No gallop.  Comments: Occasional PVCs Pulmonary:     Effort: Pulmonary effort is normal. No respiratory distress.     Breath sounds: No wheezing.     Comments: Decreased air movement. Intermittent semi-productive cough. O2 [place] Abdominal:     General: Bowel sounds are normal.     Palpations: Abdomen is soft.  Musculoskeletal:     Cervical back: Normal range of motion and neck supple.  Skin:    General: Skin is warm and dry.  Neurological:     Mental Status: He is alert and oriented to person, place, and time.     Cranial Nerves: No cranial nerve deficit.     Sensory: No sensory deficit.     Coordination: Coordination normal.     Deep Tendon Reflexes: Reflexes normal.     Comments: Motor 4/5 UE's. LE's 3/5 HF, 4/5 KE,ADF/APF.   Psychiatric:        Mood and Affect: Mood normal.        Behavior: Behavior normal.        Lab Results Last 48 Hours        Results for orders placed or performed during the hospital encounter of 12/30/21 (from the past 48 hour(s))  Glucose, capillary     Status: Abnormal    Collection Time: 01/16/22 12:43 PM  Result Value Ref Range    Glucose-Capillary 136 (H) 70 - 99 mg/dL      Comment: Glucose reference range applies  only to samples taken after fasting for at least 8 hours.  Glucose, capillary     Status: Abnormal    Collection Time: 01/16/22  4:44 PM  Result Value Ref Range    Glucose-Capillary 294 (H) 70 - 99 mg/dL      Comment: Glucose reference range applies only to samples taken after fasting for at least 8 hours.  Glucose, capillary     Status: Abnormal    Collection Time: 01/16/22  9:42 PM  Result Value Ref Range    Glucose-Capillary 238 (H) 70 - 99 mg/dL      Comment: Glucose reference range applies only to samples taken after fasting for at least 8 hours.  Glucose, capillary     Status: None    Collection Time: 01/17/22  8:38 AM  Result Value Ref Range    Glucose-Capillary 91 70 - 99 mg/dL      Comment: Glucose reference range applies only to samples taken after fasting for at least 8 hours.  Glucose, capillary     Status: Abnormal    Collection Time: 01/17/22 12:35 PM  Result Value Ref Range    Glucose-Capillary 137 (H) 70 - 99 mg/dL      Comment: Glucose reference range applies only to samples taken after fasting for at least 8 hours.  Glucose, capillary     Status: Abnormal    Collection Time: 01/17/22  4:23 PM  Result Value Ref Range    Glucose-Capillary 233 (H) 70 - 99 mg/dL      Comment: Glucose reference range applies only to samples taken after fasting for at least 8 hours.  Glucose, capillary     Status: Abnormal    Collection Time: 01/17/22  9:03 PM  Result Value Ref Range    Glucose-Capillary 265 (H) 70 - 99 mg/dL      Comment: Glucose reference range applies only to samples taken after fasting for at least 8 hours.  Glucose, capillary     Status: None    Collection Time: 01/18/22  8:13  AM  Result Value Ref Range    Glucose-Capillary 84 70 - 99 mg/dL      Comment: Glucose reference range applies only to samples taken after fasting for at least 8 hours.  Glucose, capillary     Status: Abnormal    Collection Time: 01/18/22 12:21 PM  Result Value Ref Range     Glucose-Capillary 156 (H) 70 - 99 mg/dL      Comment: Glucose reference range applies only to samples taken after fasting for at least 8 hours.      Imaging Results (Last 48 hours)  No results found.         Blood pressure (!) 118/51, pulse 90, temperature 97.8 F (36.6 C), temperature source Oral, resp. rate 20, height '5\' 6"'$  (1.676 m), weight 57.6 kg, SpO2 91 %.   Medical Problem List and Plan: 1. Functional deficits secondary to debility after COVID/respiratory failure             -patient may shower             -ELOS/Goals: 7-14 days, supervision to mod I with PT,OT 2.  Antithrombotics: -DVT/anticoagulation:  Pharmaceutical: Lovenox             -antiplatelet therapy: N/A 3. Pain Management: Oxycodone prn. Lidocaine patch for local measures.  4. Mood/Behavior/Sleep: LCSW to follow for evaluation and support.              -antipsychotic agents: N/a 5. Neuropsych/cognition: This patient is capable of making decisions on his own behalf. 6. Skin/Wound Care: Routine pressure relief measures             --maintain adequate nutritional status.  7. Fluids/Electrolytes/Nutrition: Monitor I/O. Check CMET on 09/25             -family bringing in food from home 8. IPF: Prednisone to be tapered every 14 days.             --wean oxygen as tolerated.              --Esbriet one pill TID X one week -->2 pills TID and hold there.  9. Prediabetes/steroid induced hyperglycemia: Hgb A1C-5.8 and anticipate that BS will improve with taper and increase in activity.              --change Ensure to Ensure Max with meals.  --Continue Levemir with SSI for elevated BS             --Monitor BS ac/hs.  10. H/o Prostate cancer/Urinary retention: Continue foley to SD. On flomax.  91. H/o dysphagia/Esophageal Web: Soft food. Pills with puree.              -food from home 12. Cough: Will schedule Tessalon perles bid.              -see #8      Bary Leriche, PA-C 01/18/2022   I have personally performed a  face to face diagnostic evaluation of this patient and formulated the key components of the plan.  Additionally, I have personally reviewed laboratory data, imaging studies, as well as relevant notes and concur with the physician assistant's documentation above.  The patient's status has not changed from the original H&P.  Any changes in documentation from the acute care chart have been noted above.  Meredith Staggers, MD, Mellody Drown

## 2022-01-18 NOTE — Progress Notes (Signed)
Inpatient Rehabilitation Admission Medication Review by a Pharmacist  A complete drug regimen review was completed for this patient to identify any potential clinically significant medication issues.  High Risk Drug Classes Is patient taking? Indication by Medication  Antipsychotic Yes Robitussin DM - cough Compazine (PO/IM) - nausea/vomiting  Anticoagulant Yes Lovenox - DVT ppx  Antibiotic Yes Bactrim MWF - IPF ppx  Opioid Yes Oxycodone - pain  Antiplatelet Yes ASA - stroke ppx  Hypoglycemics/insulin Yes Levemir, Novolog SSI - T2DM  Vasoactive Medication Yes Metoprolol - tachycardia/HTN  Chemotherapy No   Other Yes Tamsulosin - BPH Trazodone - sleep Protonix - GERD Pirfenidone - IPF (stored at bedside) Gabapentin, lidocaine patch - pain Benadryl - itching Prednisone taper - IPF Rosuvastatin - HLD Flexeril - muscle spasms     Type of Medication Issue Identified Description of Issue Recommendation(s)  Drug Interaction(s) (clinically significant)     Duplicate Therapy     Allergy     No Medication Administration End Date     Incorrect Dose     Additional Drug Therapy Needed     Significant med changes from prior encounter (inform family/care partners about these prior to discharge).    Other       Clinically significant medication issues were identified that warrant physician communication and completion of prescribed/recommended actions by midnight of the next day:  No  Name of provider notified for urgent issues identified:   Provider Method of Notification:     Pharmacist comments:   Time spent performing this drug regimen review (minutes):  [place], PharmD, BCPS 01/18/2022 2:54 PM

## 2022-01-18 NOTE — Progress Notes (Signed)
Explained discharge summary to patient and wife. Reviewed follow up appointments and next medication administration times. All belongings were gathered and in the patient's possession to be transported to CIR. Removed IV and telemetry. Notified CCMD.

## 2022-01-18 NOTE — Progress Notes (Signed)
Occupational Therapy Treatment Patient Details Name: Jeff Willis MRN: [id number] DOB: 02/06/1941 Today's Date: 01/18/2022   History of present illness Pt is an 81 y.o. male admitted 12/30/21 with hypoxia and failure to thrive after returning home from flight to [place] on same day; of note, pt with recent COVID+ test 12/26/21 while on cruise. Workup for IPF flare secondary to COVID and rhinovirus. ETT 9/5-9/8. PMH includes esophageal stenosis, PAD, GERD, spinal stenosis.   OT comments  Patient with good progress toward all patient focused goals.  He is off Airborne precautions, and is needing Min a for lower body ADL and in room mobility.  AIR is looking to admit the patient today, OT will follow in the acute setting.    Recommendations for follow up therapy are one component of a multi-disciplinary discharge planning process, led by the attending physician.  Recommendations may be updated based on patient status, additional functional criteria and insurance authorization.    Follow Up Recommendations  Acute inpatient rehab (3hours/day)    Assistance Recommended at Discharge Frequent or constant Supervision/Assistance  Patient can return home with the following  Help with stairs or ramp for entrance;Assist for transportation;Assistance with cooking/housework;A little help with bathing/dressing/bathroom;A little help with walking and/or transfers   Equipment Recommendations  None recommended by OT    Recommendations for Other Services      Precautions / Restrictions Precautions Precautions: Fall Precaution Comments: Watch SpO2 and HR Restrictions Weight Bearing Restrictions: No       Mobility Bed Mobility   Bed Mobility: Supine to Sit, Sit to Supine     Supine to sit: Supervision Sit to supine: Min assist     Patient Response: Cooperative  Transfers Overall transfer level: Needs assistance Equipment used: Rolling walker (2 wheels) Transfers: Sit to/from Stand Sit  to Stand: Min guard                 Balance Overall balance assessment: Needs assistance Sitting-balance support: Feet supported Sitting balance-Leahy Scale: Good     Standing balance support: Reliant on assistive device for balance Standing balance-Leahy Scale: Fair                             ADL either performed or assessed with clinical judgement   ADL       Grooming: Wash/dry hands;Wash/dry face;Min guard;Standing           Upper Body Dressing : Minimal assistance;Sitting   Lower Body Dressing: Minimal assistance;Moderate assistance;Sit to/from stand   Toilet Transfer: Supervision/safety;Ambulation;Regular Toilet                  Extremity/Trunk Assessment Upper Extremity Assessment Upper Extremity Assessment: Generalized weakness   Lower Extremity Assessment Lower Extremity Assessment: Defer to PT evaluation   Cervical / Trunk Assessment Cervical / Trunk Assessment: Normal    Vision       Perception     Praxis      Cognition Arousal/Alertness: Awake/alert Behavior During Therapy: Flat affect Overall Cognitive Status: Within Functional Limits for tasks assessed                                          Exercises      Shoulder Instructions       General Comments  VSS on 3L    Pertinent Vitals/ Pain  Pain Assessment Pain Assessment: Faces Faces Pain Scale: Hurts a little bit Pain Location: abdomen Pain Descriptors / Indicators: Discomfort Pain Intervention(s): Monitored during session                                                          Frequency  Min 2X/week        Progress Toward Goals  OT Goals(current goals can now be found in the care plan section)  Progress towards OT goals: Progressing toward goals  Acute Rehab OT Goals OT Goal Formulation: With patient Time For Goal Achievement: 01/30/22 Potential to Achieve Goals: Good  Plan Discharge plan  remains appropriate    Co-evaluation                 AM-PAC OT "6 Clicks" Daily Activity     Outcome Measure   Help from another person eating meals?: None Help from another person taking care of personal grooming?: None Help from another person toileting, which includes using toliet, bedpan, or urinal?: A Little Help from another person bathing (including washing, rinsing, drying)?: A Little Help from another person to put on and taking off regular upper body clothing?: A Little Help from another person to put on and taking off regular lower body clothing?: A Little 6 Click Score: 20    End of Session Equipment Utilized During Treatment: Rolling walker (2 wheels)  OT Visit Diagnosis: Unsteadiness on feet (R26.81);Muscle weakness (generalized) (M62.81)   Activity Tolerance Patient tolerated treatment well   Patient Left in bed;with call bell/phone within reach;with family/visitor present   Nurse Communication Mobility status        Time: 1111-1131 OT Time Calculation (min): 20 min  Charges: OT General Charges $OT Visit: 1 Visit OT Treatments $Therapeutic Activity: 8-22 mins  01/18/2022  Jeff Willis, Jeff Willis  Acute Rehabilitation Services  Office:  [phone]   Jeff Willis 01/18/2022, 11:38 AM

## 2022-01-18 NOTE — Progress Notes (Signed)
Inpatient Rehabilitation Admissions Coordinator   I have insurance approval and CIR bed to admit him today. Acute team and TOC aware. I will make the arrangements.  Danne Baxter, RN, MSN Rehab Admissions Coordinator [phone] 01/18/2022 9:59 AM

## 2022-01-18 NOTE — Discharge Instructions (Signed)
Management per CIR 

## 2022-01-18 NOTE — Progress Notes (Signed)
Pt ate food from home within 30-45 min before CBG done

## 2022-01-18 NOTE — Care Management Important Message (Signed)
Important Message  Patient Details  Name: Jeff Willis MRN: [id number] Date of Birth: 20-Oct-1940   Medicare Important Message Given:  Yes     Hannah Beat 01/18/2022, 1:34 PM

## 2022-01-18 NOTE — H&P (Signed)
Physical Medicine and Rehabilitation Admission H&P    CC: Functional deficits due to debility from Covid/   HPI: Jeff Willis is an 81 year old male with history of IPF w/UIP on Esbriet, HTN, prostate CA, chronic LBP, esophageal web s/p dilatation X 2 who was admitted on 12/31/21 after hypoxia, tachycardia and low grade fever due to covid dx towards the end of his cruise. He was treated with 5 day course antibiotics as well as Remsdesivir, Steriods and Actemera. Chest CT negative for PE but showed Left > Right diffuse groundglass opacities. Respiratory status worsened requiring intubation  09/05- 09/08 as well as levophed due to  hypotension. He was extubated to 100% HFNC, on tube feeds for nutritional support and diet slowly advanced. He reported worsening of LBP and MRI spine done revealingL5/S1 SNP effacing right lateral recss and contacting right S1 nerve root, edema L4 and L3 --nonspecific, L3/L4 mild canal stenosis and L4/L5 and L5/S1 mild neural foraminal narrowing. Gabapentin added with improvement in pain control.    Elevated D dimer felt to be in setting of Covid as LE dopplers negative for DVT and CTA chest negative for acute PE.  MBS done without aspiration but 13 mm pill briefly lodges in vallecular space. Abdominal distension resolved with titration of laxatives and BS noted to be elevated due to steroids and Levemir added. FIO2 slowly being weaned down. On 09/14, he developed acute hypoxemic respiratory failure due to aspiration PNA/pneumonitis and was started on IV solumedrol with Unasyn. Respiratory status improved with treatment and he was weaned down to 2-4 L per [place]. Tachycardia resolved with addition of BB.    Dr. Gloriann Loan consulted 09/19 for urinary retention and recommended foley placement with addition of Flomax and voiding trial in a week. Solumedrol tapered to slow prednisone taper and home Esbriet to start at one bill TID for a week followed by 2 pills TID starting  today. He continues to be limited by weakness with frequent bouts of coughing, hypoxia with activity as well as deconditioning. CIR recommended due to functional decline.    Review of Systems  Constitutional:  Negative for fever.  HENT:  Negative for hearing loss.   Eyes:  Negative for blurred vision.  Respiratory:  Positive for cough and shortness of breath.   Cardiovascular:  Negative for chest pain.  Gastrointestinal:  Negative for nausea and vomiting.  Genitourinary:  Negative for dysuria.  Musculoskeletal:  Negative for myalgias.  Skin:  Negative for rash.  Neurological:  Positive for weakness.  Psychiatric/Behavioral:  Negative for depression.      Past Medical History:  Diagnosis Date   Back pain    Elevated PSA    Essential tremor    Hypercholesteremia    Neuropathy    Prostate cancer ([place])    Sinus tachycardia 01/30/2018    Past Surgical History:  Procedure Laterality Date   COLONOSCOPY WITH PROPOFOL N/A 08/10/2021   Procedure: COLONOSCOPY WITH PROPOFOL;  Surgeon: Carol Ada, MD;  Location: [facility];  Service: Gastroenterology;  Laterality: N/A;   ESOPHAGEAL DILATION  08/10/2021   Procedure: ESOPHAGEAL DILATION;  Surgeon: Carol Ada, MD;  Location: [facility];  Service: Gastroenterology;;   ESOPHAGOGASTRODUODENOSCOPY (EGD) WITH PROPOFOL N/A 08/10/2021   Procedure: ESOPHAGOGASTRODUODENOSCOPY (EGD) WITH PROPOFOL;  Surgeon: Carol Ada, MD;  Location: [facility];  Service: Gastroenterology;  Laterality: N/A;   HEMOSTASIS CLIP PLACEMENT  08/10/2021   Procedure: HEMOSTASIS CLIP PLACEMENT;  Surgeon: Carol Ada, MD;  Location: [facility];  Service: Gastroenterology;;  LUMBAR MICRODISCECTOMY     L4-5   POLYPECTOMY  08/10/2021   Procedure: POLYPECTOMY;  Surgeon: Carol Ada, MD;  Location: [facility];  Service: Gastroenterology;;   RADIOACTIVE SEED IMPLANT N/A 02/24/2018   Procedure: RADIOACTIVE SEED IMPLANT/BRACHYTHERAPY IMPLANT;  Surgeon: Festus Aloe, MD;  Location: [facility];  Service: Urology;  Laterality: N/A;   RIGHT HEART CATH N/A 11/22/2021   Procedure: RIGHT HEART CATH;  Surgeon: Troy Sine, MD;  Location: [facility];  Service: Cardiovascular;  Laterality: N/A;   SPACE OAR INSTILLATION N/A 02/24/2018   Procedure: SPACE OAR INSTILLATION;  Surgeon: Festus Aloe, MD;  Location: [facility];  Service: Urology;  Laterality: N/A;   Family History  Problem Relation Age of Onset   Other Mother        died at 39 of natural causes   Other Father        unsure of health   Cancer Neg Hx     Social History: Married. Retired Field seismologist. Independent and reasonably active PTA. He  reports that he has never smoked. He has never used smokeless tobacco. He reports that he does not drink alcohol and does not use drugs.   Allergies  Allergen Reactions   Beef-Derived Products    Chicken Protein    Eggs Or Egg-Derived Products    Fish-Derived Products    Meat Extract     vegetarian   Ofev [Nintedanib] Other (See Comments)    Weight loss   Pork-Derived Products     Medications Prior to Admission  Medication Sig Dispense Refill   aspirin EC 81 MG tablet Take 81 mg by mouth daily. Swallow whole.     metoprolol succinate (TOPROL XL) 25 MG 24 hr tablet Take 1 tablet (25 mg total) by mouth daily. 90 tablet 3   pantoprazole (PROTONIX) 40 MG tablet Take 40 mg by mouth daily as needed (heartburn).     Pirfenidone (ESBRIET) 267 MG TABS Take 2 tablets (534 mg total) by mouth in the morning, at noon, and at bedtime. **LOW DOSE AS MAINTENANCE** (Patient taking differently: Take 267 mg by mouth in the morning, at noon, and at bedtime. **LOW DOSE AS MAINTENANCE**) 540 tablet 1   rosuvastatin (CRESTOR) 10 MG tablet Take 10 mg by mouth at bedtime.        Home: Home Living Family/patient expects to be discharged to:: Private residence Living Arrangements: Children, Spouse/significant other,  Other relatives (He and his wife live with son, [name]) Available Help at Discharge: Family, Available 24 hours/day Type of Home: House Home Access: Stairs to enter [employer] of Steps: 2 Entrance Stairs-Rails: None Home Layout: Two level, Able to live on main level with bedroom/bathroom Bathroom Shower/Tub: Tub/shower unit, Architectural technologist: Standard Bathroom Accessibility: Yes Home Equipment: Grab bars - tub/shower Additional Comments: Just came back from cruise to [place]  Lives With: Family   Functional History: Prior Function Prior Level of Function : Independent/Modified Independent, Driving Mobility Comments: No issues and was independent. ADLs Comments: I with ADL, iADL  Functional Status:  Mobility: Bed Mobility Overal bed mobility: Needs Assistance Bed Mobility: Supine to Sit, Sit to Supine Rolling: Modified independent (Device/Increase time) Sidelying to sit: Min guard (HOB flat) Supine to sit: Supervision Sit to supine: Min assist Sit to sidelying: Min assist General bed mobility comments: needing extra time, pt using bed rail. Transfers Overall transfer level: Needs assistance Equipment used: Rolling walker (2 wheels) Transfers: Sit to/from Stand Sit to  Stand: Min guard Bed to/from chair/wheelchair/BSC transfer type:: Step pivot Step pivot transfers: Min assist, From elevated surface General transfer comment: increased time to perform, cues for safe UE placement and use of brakes using rollator Ambulation/Gait Ambulation/Gait assistance: Min assist, Min guard Gait Distance (Feet): 60 Feet (x2 with seated break btwn trials, also 66f with HHA) Assistive device: Rollator (4 wheels), 1 person hand held assist Gait Pattern/deviations: Step-to pattern, Narrow base of support, Step-through pattern, Decreased dorsiflexion - right, Decreased dorsiflexion - left General Gait Details: cues for improved step length and heel strike with fair carryover. pt  with increased WOB with exertion needing min cues for standing breaks and pursed-lip breathing. SpO2 desat to 87% on 2L O2 [place] so increased to 3L. On 3L remains mostly 89-91% wtih exertion and HR to 111 bpm, pt given cues for standing break and pursed lip breathing when SpO2 drops to 88%. Increased time to perform. Pt frequently coughing. HHA for short distance in room without AD, pt using rollator for hallway mobility Gait velocity: reduced Gait velocity interpretation: <1.31 ft/sec, indicative of household ambulator Pre-gait activities: standing hip flexion x10 reps Stairs: Yes Stairs assistance: Min assist Stair Management: One rail Right, Step to pattern, Forwards Number of Stairs: 3 General stair comments: single 7" platform step in room x 3 reps with ~2 minute standing recovery break between each attempt.    ADL: ADL Overall ADL's : Needs assistance/impaired Eating/Feeding: Independent, Sitting Grooming: Wash/dry hands, Wash/dry face, Min guard, Standing Upper Body Bathing: Minimal assistance, Sitting Lower Body Bathing: Minimal assistance, Sit to/from stand Upper Body Dressing : Minimal assistance, Sitting Lower Body Dressing: Minimal assistance, Moderate assistance, Sit to/from stand Toilet Transfer: Supervision/safety, Ambulation, Regular Toilet Toileting- Clothing Manipulation and Hygiene: Moderate assistance, Sit to/from stand Functional mobility during ADLs: Minimal assistance, Cueing for safety, Cueing for sequencing, Rolling walker (2 wheels) General ADL Comments: Patient progressing towards goals, patient limited by fatigue and decreased O2 with activity  Cognition: Cognition Overall Cognitive Status: Within Functional Limits for tasks assessed Orientation Level: Oriented X4 Cognition Arousal/Alertness: Awake/alert Behavior During Therapy: Flat affect Overall Cognitive Status: Within Functional Limits for tasks assessed General Comments: Pleasant and  cooperative  Blood pressure (!) 118/51, pulse 90, temperature 97.8 F (36.6 C), temperature source Oral, resp. rate 20, height '5\' 6"'$  (1.676 m), weight 57.6 kg, SpO2 91 %. Physical Exam Vitals and nursing note reviewed.  Constitutional:      Appearance: Normal appearance. He is underweight.  HENT:     Head: Normocephalic.     Right Ear: External ear normal.     Left Ear: External ear normal.     Nose: Nose normal.  Eyes:     Conjunctiva/sclera: Conjunctivae normal.     Pupils: Pupils are equal, round, and reactive to light.  Cardiovascular:     Rate and Rhythm: Normal rate and regular rhythm.     Heart sounds: No murmur heard.    No gallop.     Comments: Occasional PVCs Pulmonary:     Effort: Pulmonary effort is normal. No respiratory distress.     Breath sounds: No wheezing.     Comments: Decreased air movement. Intermittent semi-productive cough. O2 [place] Abdominal:     General: Bowel sounds are normal.     Palpations: Abdomen is soft.  Musculoskeletal:     Cervical back: Normal range of motion and neck supple.  Skin:    General: Skin is warm and dry.  Neurological:     Mental Status: He  is alert and oriented to person, place, and time.     Cranial Nerves: No cranial nerve deficit.     Sensory: No sensory deficit.     Coordination: Coordination normal.     Deep Tendon Reflexes: Reflexes normal.     Comments: Motor 4/5 UE's. LE's 3/5 HF, 4/5 KE,ADF/APF.   Psychiatric:        Mood and Affect: Mood normal.        Behavior: Behavior normal.     Results for orders placed or performed during the hospital encounter of 12/30/21 (from the past 48 hour(s))  Glucose, capillary     Status: Abnormal   Collection Time: 01/16/22 12:43 PM  Result Value Ref Range   Glucose-Capillary 136 (H) 70 - 99 mg/dL    Comment: Glucose reference range applies only to samples taken after fasting for at least 8 hours.  Glucose, capillary     Status: Abnormal   Collection Time: 01/16/22  4:44 PM   Result Value Ref Range   Glucose-Capillary 294 (H) 70 - 99 mg/dL    Comment: Glucose reference range applies only to samples taken after fasting for at least 8 hours.  Glucose, capillary     Status: Abnormal   Collection Time: 01/16/22  9:42 PM  Result Value Ref Range   Glucose-Capillary 238 (H) 70 - 99 mg/dL    Comment: Glucose reference range applies only to samples taken after fasting for at least 8 hours.  Glucose, capillary     Status: None   Collection Time: 01/17/22  8:38 AM  Result Value Ref Range   Glucose-Capillary 91 70 - 99 mg/dL    Comment: Glucose reference range applies only to samples taken after fasting for at least 8 hours.  Glucose, capillary     Status: Abnormal   Collection Time: 01/17/22 12:35 PM  Result Value Ref Range   Glucose-Capillary 137 (H) 70 - 99 mg/dL    Comment: Glucose reference range applies only to samples taken after fasting for at least 8 hours.  Glucose, capillary     Status: Abnormal   Collection Time: 01/17/22  4:23 PM  Result Value Ref Range   Glucose-Capillary 233 (H) 70 - 99 mg/dL    Comment: Glucose reference range applies only to samples taken after fasting for at least 8 hours.  Glucose, capillary     Status: Abnormal   Collection Time: 01/17/22  9:03 PM  Result Value Ref Range   Glucose-Capillary 265 (H) 70 - 99 mg/dL    Comment: Glucose reference range applies only to samples taken after fasting for at least 8 hours.  Glucose, capillary     Status: None   Collection Time: 01/18/22  8:13 AM  Result Value Ref Range   Glucose-Capillary 84 70 - 99 mg/dL    Comment: Glucose reference range applies only to samples taken after fasting for at least 8 hours.  Glucose, capillary     Status: Abnormal   Collection Time: 01/18/22 12:21 PM  Result Value Ref Range   Glucose-Capillary 156 (H) 70 - 99 mg/dL    Comment: Glucose reference range applies only to samples taken after fasting for at least 8 hours.   No results found.    Blood  pressure (!) 118/51, pulse 90, temperature 97.8 F (36.6 C), temperature source Oral, resp. rate 20, height '5\' 6"'$  (1.676 m), weight 57.6 kg, SpO2 91 %.  Medical Problem List and Plan: 1. Functional deficits secondary to debility after COVID/respiratory failure  -  patient may shower  -ELOS/Goals: 7-14 days, supervision to mod I with PT,OT 2.  Antithrombotics: -DVT/anticoagulation:  Pharmaceutical: Lovenox             -antiplatelet therapy: N/A 3. Pain Management: Oxycodone prn. Lidocaine patch for local measures.  4. Mood/Behavior/Sleep: LCSW to follow for evaluation and support.              -antipsychotic agents: N/a 5. Neuropsych/cognition: This patient is capable of making decisions on his own behalf. 6. Skin/Wound Care: Routine pressure relief measures             --maintain adequate nutritional status.  7. Fluids/Electrolytes/Nutrition: Monitor I/O. Check CMET on 09/25  -family bringing in food from home 8. IPF: Prednisone to be tapered every 14 days.             --wean oxygen as tolerated.              --Esbriet one pill TID X one week -->2 pills TID and hold there.  9. Prediabetes/steroid induced hyperglycemia: Hgb A1C-5.8 and anticipate that BS will improve with taper and increase in activity.              --change Ensure to Ensure Max with meals.  --Continue Levemir with SSI for elevated BS             --Monitor BS ac/hs.  10. H/o Prostate cancer/Urinary retention: Continue foley to SD. On flomax.  19. H/o dysphagia/Esophageal Web: Soft food. Pills with puree.   -food from home 12. Cough: Will schedule Tessalon perles bid.   -see #8     Bary Leriche, PA-C 01/18/2022

## 2022-01-18 NOTE — Progress Notes (Signed)
Meredith Staggers, MD  Physician Other PMR Pre-admission    Signed Date of Service:  01/16/2022  1:34 PM  Related encounter: ED to Hosp-Admission (Current) from 12/30/2021 in [facility]   Signed      PMR Admission Coordinator Pre-Admission Assessment   Patient: Jeff Willis is an 81 y.o., male MRN: [id number] DOB: 1941-03-31 Height: _0  (167.6 cm) Weight: 54.7 kg   Insurance Information HMO:     PPO:      PCP:      IPA:      80/20:      OTHER:  PRIMARY: Blue Medicare      Policy#: [id number]      Subscriber: pt CM Name: [name]      Phone#: [phone]     Fax#: [phone] Pre-Cert#: [id number]      Employer:  Benefits:  Phone #: [phone]     Name: 9/20 Eff. Date: 04/29/21     Deduct: none      Out of Pocket Max: $3950      Life Max: none CIR: $335 co pay per day days 1 until 5      SNF: no co pay days 1 until 20; $196 co pay per day days 21 until 60; no c pp[ay days 61 until 100 Outpatient: $10 per visit     Co-Pay: visits per medical neccesity Home Health: 100%      Co-Pay: visits per medical neccesity DME: 80%     Co-Pay: 20% Providers: in network  SECONDARY: none         Financial Counselor:       Phone#:    The Engineer, petroleum" for patients in Inpatient Rehabilitation Facilities with attached "Privacy Act [place] Records" was provided and verbally reviewed with: Patient and Family   Emergency Contact Information Contact Information       Name Relation Home Work [name] [phone]   [phone]    [name], [name]     [phone]    [name]     [phone]    [name] Daughter     [phone]    [name], [name] [phone]             Current Medical History  Patient Admitting Diagnosis: Acute respiratory failure due to COVID   History of Present Illness: 81 year old male with history significant for Idiopathic pulmonary fibrosis on [place]riet on  room air, esophageal stenosis s/p dilation , essential tremor, neuropathy, HLD and prostate Cancer.. Recent cruise to [place]. On 12/24/21 developed fever and cough and was diagnosed with COVID on cruise. Returned to [facility] on flight on 12/30/21 and presented to emergency room, due to hypoxemia. CT chest showed left greater than right diffuse groundglass opacities. Admitted for acute hypoxemic respiratory failure.    On 9/5 developer worsening respiratory failure requiring intubation. Eventually extubated 9/8. PCCM consulted to manage. On 9/14 again requiring 20 liters HFNC. CTA chest negative for PE. Completed dose of dexamethasone and started solumedrol on 9/14. Switched to prednisone long taper and Bactrim as prophylaxis. Resumed home Esbriet. Completed 5 days Unasyn. To continue use of flutter valve and prn Mucinex. Was on airborne precautions for COVID for 21 days until 9/21. On 2 liters [place] O2.   Developed urinary retention and Urology consulted. S/p I and O cath with 1100 cc retention. Recommended insert Foley and attempt voiding trial once at AIR level rehab. Start Flomax. Metoprolol for tachycardia and  a fib. Prn hydralazine for HTN.    MBS with no evidence of aspiration. SLP recommended regular diet but to avoid large bolus of food, hard meat , and should eat soft food. History of dysphagia for solid and spices with known esophageal dilation April 2023. Dietician consulted due to severe protein caloric malnutrition due to acute illness.    Low dose gabapentin for acute on chronic spondylosis with back pain. On lovenox for DVT prophylaxis.    Patient's medical record from [facility] has been reviewed by the rehabilitation admission coordinator and physician.   Past Medical History      Past Medical History:  Diagnosis Date   Back pain     Elevated PSA     Essential tremor     Hypercholesteremia     Neuropathy     Prostate cancer ([place])     Sinus tachycardia 01/30/2018    Has the  patient had major surgery during 100 days prior to admission? No   Family History   family history includes Other in his father and mother.   Current Medications   Current Facility-Administered Medications:    0.9 %  sodium chloride infusion, , Intravenous, PRN, Brand Males, MD, Paused at 01/01/22 0609   acetaminophen (TYLENOL) tablet 650 mg, 650 mg, Oral, Q6H PRN, Etta Quill, DO, 650 mg at 01/11/22 1502   aspirin chewable tablet 81 mg, 81 mg, Oral, Daily, Margaretha Seeds, MD, 81 mg at 01/17/22 1144   Chlorhexidine Gluconate Cloth 2 % PADS 6 each, 6 each, Topical, Daily, Elgergawy, Silver Huguenin, MD, 6 each at 01/17/22 1247   cyclobenzaprine (FLEXERIL) tablet 5 mg, 5 mg, Oral, BID PRN, Margaretha Seeds, MD, 5 mg at 01/07/22 2227   enoxaparin (LOVENOX) injection 40 mg, 40 mg, Subcutaneous, Q24H, Jagadish, Mayuri, MD, 40 mg at 01/17/22 1145   feeding supplement (ENSURE ENLIVE / ENSURE PLUS) liquid 237 mL, 237 mL, Oral, BID BM, Regalado, Belkys A, MD, 237 mL at 01/16/22 2154   gabapentin (NEURONTIN) 250 MG/5ML solution 100 mg, 100 mg, Oral, TID, Regalado, Belkys A, MD, 100 mg at 01/17/22 1144   guaiFENesin (ROBITUSSIN) 100 MG/5ML liquid 5 mL, 5 mL, Oral, Q4H PRN, Regalado, Belkys A, MD, 5 mL at 01/17/22 1247   hydrALAZINE (APRESOLINE) injection 10 mg, 10 mg, Intravenous, Q6H PRN, Regalado, Belkys A, MD   insulin aspart (novoLOG) injection 0-6 Units, 0-6 Units, Subcutaneous, TID WC, Alma Friendly, MD, 3 Units at 01/16/22 1737   insulin detemir (LEVEMIR) injection 6 Units, 6 Units, Subcutaneous, Daily, Alma Friendly, MD, 6 Units at 01/17/22 1145   ipratropium-albuterol (DUONEB) 0.5-2.5 (3) MG/3ML nebulizer solution 3 mL, 3 mL, Nebulization, Q6H PRN, Mannam, Praveen, MD, 3 mL at 01/04/22 1625   labetalol (NORMODYNE) injection 5 mg, 5 mg, Intravenous, Q6H PRN, Margaretha Seeds, MD, 5 mg at 01/04/22 1835   lidocaine (LIDODERM) 5 % 1 patch, 1 patch, Transdermal, Q24H,  Katsadouros, Vasilios, MD, 1 patch at 01/16/22 1738   metoprolol tartrate (LOPRESSOR) tablet 12.5 mg, 12.5 mg, Oral, BID, Margaretha Seeds, MD, 12.5 mg at 01/17/22 1144   multivitamin with minerals tablet 1 tablet, 1 tablet, Oral, Daily, Alma Friendly, MD, 1 tablet at 01/17/22 1142   ondansetron (ZOFRAN) tablet 4 mg, 4 mg, Oral, Q6H PRN **OR** ondansetron (ZOFRAN) injection 4 mg, 4 mg, Intravenous, Q6H PRN, Etta Quill, DO   Oral care mouth rinse, 15 mL, Mouth Rinse, PRN, Margaretha Seeds, MD  oxyCODONE (Oxy IR/ROXICODONE) immediate release tablet 5 mg, 5 mg, Oral, Q4H PRN, Regalado, Belkys A, MD, 5 mg at 01/07/22 2225   pantoprazole (PROTONIX) EC tablet 40 mg, 40 mg, Oral, BID, Regalado, Belkys A, MD, 40 mg at 01/17/22 1143   Pirfenidone CAPS 267 mg, 267 mg, Oral, 3 times per day, Elgergawy, Silver Huguenin, MD   polyethylene glycol (MIRALAX / GLYCOLAX) packet 17 g, 17 g, Oral, BID, Gerrit Heck, MD, 17 g at 01/14/22 2146   predniSONE (DELTASONE) tablet 40 mg, 40 mg, Oral, Q breakfast, 40 mg at 01/17/22 1143 **FOLLOWED BY** [START ON 01/29/2022] predniSONE (DELTASONE) tablet 30 mg, 30 mg, Oral, Q breakfast **FOLLOWED BY** [START ON 02/12/2022] predniSONE (DELTASONE) tablet 20 mg, 20 mg, Oral, Q breakfast **FOLLOWED BY** [START ON 02/26/2022] predniSONE (DELTASONE) tablet 10 mg, 10 mg, Oral, Q breakfast **FOLLOWED BY** [START ON 03/12/2022] predniSONE (DELTASONE) tablet 5 mg, 5 mg, Oral, Q breakfast, Groce, Sarah F, NP   rosuvastatin (CRESTOR) tablet 10 mg, 10 mg, Oral, QHS, Margaretha Seeds, MD, 10 mg at 01/16/22 2151   senna (SENOKOT) tablet 17.2 mg, 2 tablet, Oral, Daily, Margaretha Seeds, MD, 17.2 mg at 01/13/22 1014   sodium chloride (OCEAN) 0.65 % nasal spray 1 spray, 1 spray, Each Nare, PRN, Anders Simmonds, MD, 1 spray at 01/10/22 2158   [START ON 01/18/2022] sulfamethoxazole-trimethoprim (BACTRIM DS) 800-160 MG per tablet 1 tablet, 1 tablet, Oral, Once per day on Mon Wed Fri,  Ezenduka, Nkeiruka J, MD   tamsulosin [facility]) capsule 0.4 mg, 0.4 mg, Oral, QPC supper, Alma Friendly, MD, 0.4 mg at 01/16/22 1736   Patients Current Diet:  Diet Order                  Diet regular Room service appropriate? Yes; Fluid consistency: Thin  Diet effective now                       Precautions / Restrictions Precautions Precautions: Fall Precaution Comments: Watch SpO2 and HR Restrictions Weight Bearing Restrictions: No    Has the patient had 2 or more falls or a fall with injury in the past year? No   Prior Activity Level Community (5-7x/wk): independent   Prior Functional Level Self Care: Did the patient need help bathing, dressing, using the toilet or eating? Independent   Indoor Mobility: Did the patient need assistance with walking from room to room (with or without device)? Independent   Stairs: Did the patient need assistance with internal or external stairs (with or without device)? Independent   Functional Cognition: Did the patient need help planning regular tasks such as shopping or remembering to take medications? Independent   Patient Information Are you of Hispanic, Latino/a,or Spanish origin?: A. No, not of Hispanic, Latino/a, or Spanish origin What is your race?: D. Asian [place] Do you need or want an interpreter to communicate with a doctor or health care staff?: 0. No   Patient's Response To:  Health Literacy and Transportation Is the patient able to respond to health literacy and transportation needs?: Yes Health Literacy - How often do you need to have someone help you when you read instructions, pamphlets, or other written material from your doctor or pharmacy?: Never In the past 12 months, has lack of transportation kept you from medical appointments or from getting medications?: No In the past 12 months, has lack of transportation kept you from meetings, work, or from getting things needed for daily living?:  No   Home  Assistive Devices / Equipment Home Assistive Devices/Equipment: None Home Equipment: Grab bars - tub/shower   Prior Device Use: Indicate devices/aids used by the patient prior to current illness, exacerbation or injury? None of the above   Current Functional Level Cognition   Overall Cognitive Status: Within Functional Limits for tasks assessed Orientation Level: Oriented X4 General Comments: Pleasant and cooperative    Extremity Assessment (includes Sensation/Coordination)   Upper Extremity Assessment: Generalized weakness  Lower Extremity Assessment: Defer to PT evaluation     ADLs   Overall ADL's : Needs assistance/impaired Eating/Feeding: Independent, Sitting Grooming: Wash/dry hands, Wash/dry face, Sitting, Set up Upper Body Bathing: Minimal assistance, Sitting Lower Body Bathing: Minimal assistance, Sit to/from stand Upper Body Dressing : Minimal assistance, Sitting Lower Body Dressing: Minimal assistance, Sit to/from stand Toilet Transfer: Minimal assistance, BSC/3in1, Stand-pivot, Rolling walker (2 wheels) Toileting- Clothing Manipulation and Hygiene: Moderate assistance, Sit to/from stand Functional mobility during ADLs: Minimal assistance, Cueing for safety, Cueing for sequencing, Rolling walker (2 wheels) General ADL Comments: Patient progressing towards goals, patient limited by fatigue and decreased O2 with activity     Mobility   Overal bed mobility: Needs Assistance Bed Mobility: Sit to Supine Rolling: Modified independent (Device/Increase time) Sidelying to sit: Min guard (HOB flat) Sit to supine: Min guard Sit to sidelying: Min assist General bed mobility comments: needing extra time, pt using bed rail.     Transfers   Overall transfer level: Needs assistance Equipment used: Rolling walker (2 wheels) Transfers: Sit to/from Stand Sit to Stand: Min guard Bed to/from chair/wheelchair/BSC transfer type:: Step pivot Step pivot transfers: Min assist, From  elevated surface General transfer comment: increased time to perform, cues for safe UE placement to push from arm rests and to reach back when sitting EOB     Ambulation / Gait / Stairs / Wheelchair Mobility   Ambulation/Gait Ambulation/Gait assistance: Herbalist (Feet): 55 Feet Assistive device: Rolling walker (2 wheels) Gait Pattern/deviations: Step-to pattern, Narrow base of support, Step-through pattern, Decreased dorsiflexion - right, Decreased dorsiflexion - left General Gait Details: cues for improved step length and heel strike with fair carryover. pt with increased WOB with exertion needing min cues for standing breaks and pursed-lip breathing. SpO2 intermittently desat to 89% on 4L O2 [place] but improves with standing rest break to 92% and greater. Gait velocity: reduced Gait velocity interpretation: <1.31 ft/sec, indicative of household ambulator Pre-gait activities: standing hip flexion x10 reps Stairs:  (pt able to march in place with high knees, plan to attempt next session)     Posture / Balance Dynamic Sitting Balance Sitting balance - Comments: able to don bilateral socks sitting EOB with increased time Balance Overall balance assessment: Needs assistance Sitting-balance support: Feet supported Sitting balance-Leahy Scale: Good Sitting balance - Comments: able to don bilateral socks sitting EOB with increased time Postural control: Posterior lean Standing balance support: Reliant on assistive device for balance Standing balance-Leahy Scale: Poor Standing balance comment: reliant on AD and or external support     Special needs/care consideration 9/19 16 fr non latex catheter placed O 2 at 2 liters [place]    Previous Home Environment  Living Arrangements: Children, Spouse/significant other, Other relatives (He and his wife live with son, [name])  Lives With: Family Available Help at Discharge: Family, Available 24 hours/day Type of Home: House Home Layout: Two  level, Able to live on main level with bedroom/bathroom Home Access: Stairs to enter Entrance Stairs-Rails: None Entrance  Stairs-Number of Steps: 2 Bathroom Shower/Tub: Tub/shower unit, Industrial/product designer: Yes How Accessible: Accessible via walker Home Care Services: No Additional Comments: Just came back from cruise to [place]   Discharge Living Setting Plans for Discharge Living Setting: Lives with (comment) (he and his wife live with son, [name] and his family) Type of Home at Discharge: House Discharge Home Layout: Two level, Able to live on main level with bedroom/bathroom Discharge Home Access: Stairs to enter Entrance Stairs-Rails: None Entrance Stairs-Number of Steps: 2 Discharge Bathroom Shower/Tub: Tub/shower unit, Curtain Discharge Bathroom Toilet: Standard Discharge Bathroom Accessibility: Yes How Accessible: Accessible via walker Does the patient have any problems obtaining your medications?: No   Social/Family/Support Systems Patient Roles: Spouse, 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 Information: son, [name] and Son, Lyanne Co Anticipated Caregiver: family Anticipated Caregiver's Contact Information: see contacts Ability/Limitations of Caregiver: none Caregiver Availability: 24/7 Discharge Plan Discussed with Primary Caregiver: Yes Is Caregiver In Agreement with Plan?: Yes Does Caregiver/Family have Issues with Lodging/Transportation while Pt is in Rehab?: No   Goals Patient/Family Goal for Rehab: Mod I to supervision with PT and OT Expected length of stay: ELOS 7 to 14 days Cultural Considerations: Hindu with specific dietary requirements related to religious preference Additional Information: Family would like to be given order for grounds pass with family when medically cleared appropriate Pt/Family Agrees to Admission and willing to participate: Yes Program Orientation Provided & Reviewed with Pt/Caregiver Including Roles  &  Responsibilities: Yes   Decrease burden of Care through IP rehab admission: n/a   Possible need for SNF placement upon discharge: not anticipated   Patient Condition: I have reviewed medical records from [facility], spoken with CSW, and patient, son, and daughter. I met with patient at the bedside for inpatient rehabilitation assessment.  Patient will benefit from ongoing PT and OT, can actively participate in 3 hours of therapy a day 5 days of the week, and can make measurable gains during the admission.  Patient will also benefit from the coordinated team approach during an Inpatient Acute Rehabilitation admission.  The patient will receive intensive therapy as well as Rehabilitation physician, nursing, social worker, and care management interventions.  Due to bladder management, bowel management, safety, skin/wound care, disease management, medication administration, pain management, and patient education the patient requires 24 hour a day rehabilitation nursing.  The patient is currently min assist overall with mobility and basic ADLs.  Discharge setting and therapy post discharge at home with home health is anticipated.  Patient has agreed to participate in the Acute Inpatient Rehabilitation Program and will admit today.   Preadmission Screen Completed By:  Cleatrice Burke, 01/17/2022 3:58 PM ______________________________________________________________________   Discussed status with Dr. Naaman Plummer on 01/18/22 at 1013 and received approval for admission today.   Admission Coordinator:  Cleatrice Burke, RN, time 4132 Date 01/18/22    Assessment/Plan: Diagnosis: debility after resp failure/covid Does the need for close, 24 hr/day Medical supervision in concert with the patient's rehab needs make it unreasonable for this patient to be served in a less intensive setting? Yes Co-Morbidities requiring supervision/potential complications: PF, esophageal stenosis, neuropathy Due to  bladder management, bowel management, safety, skin/wound care, disease management, medication administration, pain management, and patient education, does the patient require 24 hr/day rehab nursing? Yes Does the patient require coordinated care of a physician, rehab nurse, PT, OT, and   to address physical and functional deficits in the context of the above medical diagnosis(es)? Yes Addressing deficits in  the following areas: balance, endurance, locomotion, strength, transferring, bowel/bladder control, bathing, dressing, feeding, grooming, and toileting Can the patient actively participate in an intensive therapy program of at least 3 hrs of therapy 5 days a week? Yes The potential for patient to make measurable gains while on inpatient rehab is excellent Anticipated functional outcomes upon discharge from inpatient rehab: modified independent and supervision PT, modified independent and supervision OT, n/a SLP Estimated rehab length of stay to reach the above functional goals is: 7-14 days Anticipated discharge destination: Home 10. Overall Rehab/Functional Prognosis: excellent     MD Signature: Meredith Staggers, MD, [place] Director Rehabilitation Services 01/18/2022          Revision History                                Note Details  Author Meredith Staggers, MD File Time 01/18/2022 10:24 AM  Author Type Physician Status Signed  Last Editor Meredith Staggers, MD Service Other

## 2022-01-18 NOTE — Telephone Encounter (Signed)
Called and spoke with pt's son [name] letting him know the info per MR and he verbalized understanding. Nothing further needed.

## 2022-01-18 NOTE — Plan of Care (Signed)
  Problem: Education: Goal: Knowledge of risk factors and measures for prevention of condition will improve Outcome: Adequate for Discharge   Problem: Coping: Goal: Psychosocial and spiritual needs will be supported Outcome: Adequate for Discharge   Problem: Respiratory: Goal: Will maintain a patent airway Outcome: Adequate for Discharge Goal: Complications related to the disease process, condition or treatment will be avoided or minimized Outcome: Adequate for Discharge   Problem: Education: Goal: Knowledge of General Education information will improve Description: Including pain rating scale, medication(s)/side effects and non-pharmacologic comfort measures Outcome: Adequate for Discharge   Problem: Health Behavior/Discharge Planning: Goal: Ability to manage health-related needs will improve Outcome: Adequate for Discharge   Problem: Clinical Measurements: Goal: Ability to maintain clinical measurements within normal limits will improve Outcome: Adequate for Discharge Goal: Will remain free from infection Outcome: Adequate for Discharge Goal: Diagnostic test results will improve Outcome: Adequate for Discharge Goal: Respiratory complications will improve Outcome: Adequate for Discharge Goal: Cardiovascular complication will be avoided Outcome: Adequate for Discharge   Problem: Activity: Goal: Risk for activity intolerance will decrease Outcome: Adequate for Discharge   Problem: Nutrition: Goal: Adequate nutrition will be maintained Outcome: Adequate for Discharge   Problem: Coping: Goal: Level of anxiety will decrease Outcome: Adequate for Discharge   Problem: Elimination: Goal: Will not experience complications related to bowel motility Outcome: Adequate for Discharge Goal: Will not experience complications related to urinary retention Outcome: Adequate for Discharge   Problem: Pain Managment: Goal: General experience of comfort will improve Outcome: Adequate  for Discharge   Problem: Safety: Goal: Ability to remain free from injury will improve Outcome: Adequate for Discharge   Problem: Skin Integrity: Goal: Risk for impaired skin integrity will decrease Outcome: Adequate for Discharge   Problem: Education: Goal: Ability to describe self-care measures that may prevent or decrease complications (Diabetes Survival Skills Education) will improve Outcome: Adequate for Discharge Goal: Individualized Educational Video(s) Outcome: Adequate for Discharge   Problem: Coping: Goal: Ability to adjust to condition or change in health will improve Outcome: Adequate for Discharge   Problem: Fluid Volume: Goal: Ability to maintain a balanced intake and output will improve Outcome: Adequate for Discharge   Problem: Health Behavior/Discharge Planning: Goal: Ability to identify and utilize available resources and services will improve Outcome: Adequate for Discharge Goal: Ability to manage health-related needs will improve Outcome: Adequate for Discharge   Problem: Metabolic: Goal: Ability to maintain appropriate glucose levels will improve Outcome: Adequate for Discharge   Problem: Nutritional: Goal: Maintenance of adequate nutrition will improve Outcome: Adequate for Discharge Goal: Progress toward achieving an optimal weight will improve Outcome: Adequate for Discharge   Problem: Skin Integrity: Goal: Risk for impaired skin integrity will decrease Outcome: Adequate for Discharge   Problem: Tissue Perfusion: Goal: Adequacy of tissue perfusion will improve Outcome: Adequate for Discharge

## 2022-01-19 DIAGNOSIS — R1319 Other dysphagia: Secondary | ICD-10-CM | POA: Diagnosis not present

## 2022-01-19 DIAGNOSIS — R5381 Other malaise: Secondary | ICD-10-CM | POA: Diagnosis not present

## 2022-01-19 DIAGNOSIS — J8410 Pulmonary fibrosis, unspecified: Secondary | ICD-10-CM | POA: Diagnosis not present

## 2022-01-19 DIAGNOSIS — R339 Retention of urine, unspecified: Secondary | ICD-10-CM | POA: Diagnosis not present

## 2022-01-19 LAB — GLUCOSE, CAPILLARY
Glucose-Capillary: 73 mg/dL (ref 70–99)
Glucose-Capillary: 127 mg/dL — ABNORMAL HIGH (ref 70–99)
Glucose-Capillary: 158 mg/dL — ABNORMAL HIGH (ref 70–99)
Glucose-Capillary: 283 mg/dL — ABNORMAL HIGH (ref 70–99)

## 2022-01-19 MED ORDER — CHLORHEXIDINE GLUCONATE CLOTH 2 % EX PADS
6.0000 | MEDICATED_PAD | Freq: Every day | CUTANEOUS | Status: DC
  Administered 2022-01-20 – 2022-01-26 (×6): 6 via TOPICAL

## 2022-01-19 MED ORDER — GUAIFENESIN-DM 100-10 MG/5ML PO SYRP
15.0000 mL | ORAL_SOLUTION | Freq: Three times a day (TID) | ORAL | Status: DC
  Administered 2022-01-19 – 2022-01-23 (×6): 15 mL via ORAL
  Filled 2022-01-19 (×10): qty 20

## 2022-01-19 MED ORDER — CHLORHEXIDINE GLUCONATE CLOTH 2 % EX PADS: 6.0000 | MEDICATED_PAD | Freq: Every day | CUTANEOUS | Status: DC

## 2022-01-19 NOTE — Progress Notes (Signed)
Physical Therapy Session Note  Patient Details  Name: Jeff Willis MRN: [id number] Date of Birth: 01/17/1941  Today's Date: 01/19/2022 PT Individual Time: 1400-1445 PT Individual Time Calculation (min): 45 min   Short Term Goals: Week 1:  PT Short Term Goal 1 (Week 1): STGs = LTGs  Skilled Therapeutic Interventions/Progress Updates:  Pt was seen bedside in the pm. Pt transferred to edge of bed with S. Pt performed all sit to stand transfers with c/g. Pt ambulated 80 feet x 2 without assistive device and min A and ambulated 100 feet x 2 with rolling walker and c/g with verbal cues. O2 sat at rest 95-96%. O2 sat decreased with 87% with ambulation, pt was able to recover to 95% with verbal cues and rest break. Improved recovery with 2 liters O2. At end of treatment, O2 sat at 95% on 1 liter and pt sitting up in w/c with all needs within reach.   Therapy Documentation Precautions:  Precautions Precautions: Fall Precaution Comments: Watch SpO2 and HR Restrictions Weight Bearing Restrictions: No General: Chart Reviewed: Yes Family/Caregiver Present: Yes Vital Signs: Oxygen Therapy O2 Device: Nasal Cannula O2 Flow Rate (L/min): 1 L/min Patient Activity (if Appropriate): In bed SpO2 Alarm Limit Low: 96 Pain: Pain Assessment Pain Scale: 0-10 Pain Score: 0-No pain   Other Treatments:      Therapy/Group: Individual Therapy  Dub Amis 01/19/2022, 4:12 PM

## 2022-01-19 NOTE — Evaluation (Signed)
Physical Therapy Assessment and Plan  Patient Details  Name: Jeff Willis MRN: [id number] Date of Birth: Aug 04, 1940  PT Diagnosis: Difficulty walking and Muscle weakness Rehab Potential: Good ELOS: 7 to 10 days   Today's Date: 01/19/2022 PT Individual Time: 9357-0177 PT Individual Time Calculation (min): 70 min    Hospital Problem: Principal Problem:   Debility   Past Medical History:  Past Medical History:  Diagnosis Date   Back pain    Elevated PSA    Essential tremor    Hypercholesteremia    Neuropathy    Prostate cancer ([place])    Sinus tachycardia 01/30/2018   Past Surgical History:  Past Surgical History:  Procedure Laterality Date   COLONOSCOPY WITH PROPOFOL N/A 08/10/2021   Procedure: COLONOSCOPY WITH PROPOFOL;  Surgeon: Carol Ada, MD;  Location: [facility];  Service: Gastroenterology;  Laterality: N/A;   ESOPHAGEAL DILATION  08/10/2021   Procedure: ESOPHAGEAL DILATION;  Surgeon: Carol Ada, MD;  Location: [facility];  Service: Gastroenterology;;   ESOPHAGOGASTRODUODENOSCOPY (EGD) WITH PROPOFOL N/A 08/10/2021   Procedure: ESOPHAGOGASTRODUODENOSCOPY (EGD) WITH PROPOFOL;  Surgeon: Carol Ada, MD;  Location: [facility];  Service: Gastroenterology;  Laterality: N/A;   HEMOSTASIS CLIP PLACEMENT  08/10/2021   Procedure: HEMOSTASIS CLIP PLACEMENT;  Surgeon: Carol Ada, MD;  Location: [facility];  Service: Gastroenterology;;   LUMBAR MICRODISCECTOMY     L4-5   POLYPECTOMY  08/10/2021   Procedure: POLYPECTOMY;  Surgeon: Carol Ada, MD;  Location: [facility];  Service: Gastroenterology;;   RADIOACTIVE SEED IMPLANT N/A 02/24/2018   Procedure: RADIOACTIVE SEED IMPLANT/BRACHYTHERAPY IMPLANT;  Surgeon: Festus Aloe, MD;  Location: [facility];  Service: Urology;  Laterality: N/A;   RIGHT HEART CATH N/A 11/22/2021   Procedure: RIGHT HEART CATH;  Surgeon: Troy Sine, MD;  Location: [place] [facility];  Service: Cardiovascular;   Laterality: N/A;   SPACE OAR INSTILLATION N/A 02/24/2018   Procedure: SPACE OAR INSTILLATION;  Surgeon: Festus Aloe, MD;  Location: [facility];  Service: Urology;  Laterality: N/A;    Assessment & Plan Clinical Impression: Patient is an 81 year old male with history of IPF w/UIP on Esbriet, HTN, prostate CA, chronic LBP, esophageal web s/p dilatation X 2 who was admitted on 12/31/21 after hypoxia, tachycardia and low grade fever due to covid dx towards the end of his cruise. He was treated with 5 day course antibiotics as well as Remsdesivir, Steriods and Actemera. Chest CT negative for PE but showed Left > Right diffuse groundglass opacities. Respiratory status worsened requiring intubation  09/05- 09/08 as well as levophed due to  hypotension. He was extubated to 100% HFNC, on tube feeds for nutritional support and diet slowly advanced. He reported worsening of LBP and MRI spine done revealingL5/S1 SNP effacing right lateral recss and contacting right S1 nerve root, edema L4 and L3 --nonspecific, L3/L4 mild canal stenosis and L4/L5 and L5/S1 mild neural foraminal narrowing. Gabapentin added with improvement in pain control.    Elevated D dimer felt to be in setting of Covid as LE dopplers negative for DVT and CTA chest negative for acute PE.  MBS done without aspiration but 13 mm pill briefly lodges in vallecular space. Abdominal distension resolved with titration of laxatives and BS noted to be elevated due to steroids and Levemir added. FIO2 slowly being weaned down. On 09/14, he developed acute hypoxemic respiratory failure due to aspiration PNA/pneumonitis and was started on IV solumedrol with Unasyn. Respiratory status improved with treatment and he  was weaned down to 2-4 L per [place]. Tachycardia resolved with addition of BB.    Dr. Gloriann Loan consulted 09/19 for urinary retention and recommended foley placement with addition of Flomax and voiding trial in a week. Solumedrol tapered to  slow prednisone taper and home Esbriet to start at one bill TID for a week followed by 2 pills TID starting today. He continues to be limited by weakness with frequent bouts of coughing, hypoxia with activity as well as deconditioning. CIR recommended due to functional decline.   Patient transferred to CIR on 01/18/2022 .   Patient currently requires min with mobility secondary to muscle weakness and decreased cardiorespiratoy endurance and decreased oxygen support.  Prior to hospitalization, patient was independent  with mobility and lived with Family in a House home.  Home access is 3Stairs to enter.  Patient will benefit from skilled PT intervention to maximize safe functional mobility, minimize fall risk, and decrease caregiver burden for planned discharge home with 24 hour supervision.  Anticipate patient will benefit from follow up [place] at discharge.  PT - End of Session Activity Tolerance: Tolerates 30+ min activity with multiple rests Endurance Deficit: Yes Endurance Deficit Description: O2 sats decreased with light activity PT Assessment Rehab Potential (ACUTE/IP ONLY): Good PT Barriers to Discharge: Inaccessible home environment PT Barriers to Discharge Comments: 3 steps no rails to enter PT Patient demonstrates impairments in the following area(s): Balance;Endurance;Motor PT Transfers Functional Problem(s): Bed Mobility;Bed to Chair;Car PT Locomotion Functional Problem(s): Ambulation;Stairs PT Plan PT Intensity: Minimum of 1-2 x/day ,45 to 90 minutes PT Frequency: 5 out of 7 days PT Duration Estimated Length of Stay: 7 to 10 days PT Treatment/Interventions: Ambulation/gait training;Balance/vestibular training;Discharge planning;Functional mobility training;Neuromuscular re-education;Patient/family education;Stair training;Therapeutic Exercise;UE/LE Coordination activities;UE/LE Strength taining/ROM;Therapeutic Activities PT Transfers Anticipated Outcome(s): mod I transfers PT  Locomotion Anticipated Outcome(s): mod I gait and S stairs PT Recommendation Follow Up Recommendations: Home health PT;Outpatient PT Patient destination: Home Equipment Recommended: To be determined   PT Evaluation Precautions/Restrictions Precautions Precautions: Fall Precaution Comments: Watch SpO2 and HR Restrictions Weight Bearing Restrictions: No General Chart Reviewed: Yes Family/Caregiver Present: Yes Vital SignsOxygen Therapy O2 Device: Nasal Cannula O2 Flow Rate (L/min): 1 L/min Patient Activity (if Appropriate): In bed SpO2 Alarm Limit Low: 98 Pain Pain Assessment Pain Scale: 0-10 Pain Score: 0-No pain Pain Interference Pain Interference Pain Effect on Sleep: 1. Rarely or not at all Pain Interference with Therapy Activities: 1. Rarely or not at all Pain Interference with Day-to-Day Activities: 1. Rarely or not at all Home Living/Prior [place] Available Help at Discharge: Family;Available 24 hours/day Type of Home: House Home Access: Stairs to enter [employer] of Steps: 3 Entrance Stairs-Rails: None Home Layout: Two level;Able to live on main level with bedroom/bathroom  Lives With: Family Prior Function Level of Independence: Independent with gait;Independent with transfers  Able to Take Stairs?: Yes Driving: Yes Vision/Perception  WFLs, reading glasses Cognition Overall Cognitive Status: Within Functional Limits for tasks assessed Arousal/Alertness: Awake/alert Orientation Level: Oriented X4 Year: 2023 Month: September Day of Week: Correct Awareness: Appears intact Problem Solving: Appears intact Safety/Judgment: Appears intact Sensation Sensation Light Touch: Appears Intact Coordination Gross Motor Movements are Fluid and Coordinated: Yes Motor  Motor Motor: Within Functional Limits   Trunk/Postural Assessment  Cervical Assessment Cervical Assessment: Within Functional Limits Thoracic Assessment Thoracic  Assessment: Exceptions to [facility] Lumbar Assessment Lumbar Assessment: Exceptions to [facility] Postural Control Postural Control: Within Functional Limits  Balance Balance Balance Assessed: Yes Static Sitting Balance Static  Sitting - Balance Support: Feet supported Static Sitting - Level of Assistance: 5: Stand by assistance Static Standing Balance Static Standing - Balance Support: During functional activity Static Standing - Level of Assistance: 4: Min assist;5: Stand by assistance Dynamic Standing Balance Dynamic Standing - Balance Support: During functional activity Dynamic Standing - Level of Assistance: 4: Min assist Extremity Assessment  B UEs as per OT evaluation RLE Assessment RLE Assessment: Exceptions to [facility] Active Range of Motion (AROM) Comments: WFLs General Strength Comments: grossly 3/5 LLE Assessment LLE Assessment: Exceptions to [facility] Active Range of Motion (AROM) Comments: WFLs General Strength Comments: grossly 3/5  Care Tool Care Tool Bed Mobility Roll left and right activity        Sit to lying activity   Sit to lying assist level: Supervision/Verbal cueing    Lying to sitting on side of bed activity   Lying to sitting on side of bed assist level: the ability to move from lying on the back to sitting on the side of the bed with no back support.: Supervision/Verbal cueing     Care Tool Transfers Sit to stand transfer   Sit to stand assist level: Contact Guard/Touching assist    Chair/bed transfer   Chair/bed transfer assist level: Minimal Assistance - Patient > 75%     Toilet transfer   Assist Level: Minimal Assistance - Patient > 75%    Car transfer   Car transfer assist level: Minimal Assistance - Patient > 75%;Contact Guard/Touching assist      Care Tool Locomotion Ambulation   Assist level: Minimal Assistance - Patient > 75% Assistive device: No Device Max distance: 100  Walk 10 feet activity   Assist level: Minimal Assistance - Patient >  75% Assistive device: No Device   Walk 50 feet with 2 turns activity   Assist level: Minimal Assistance - Patient > 75% Assistive device: No Device  Walk 150 feet activity Walk 150 feet activity did not occur: Safety/medical concerns      Walk 10 feet on uneven surfaces activity Walk 10 feet on uneven surfaces activity did not occur: Safety/medical concerns      Stairs   Assist level: Minimal Assistance - Patient > 75% Stairs assistive device: 2 hand rails Max number of stairs: 4  Walk up/down 1 step activity   Walk up/down 1 step (curb) assist level: Minimal Assistance - Patient > 75% Walk up/down 1 step or curb assistive device: 2 hand rails  Walk up/down 4 steps activity   Walk up/down 4 steps assist level: Minimal Assistance - Patient > 75% Walk up/down 4 steps assistive device: 2 hand rails  Walk up/down 12 steps activity Walk up/down 12 steps activity did not occur: Safety/medical concerns      Pick up small objects from floor Pick up small object from the floor (from standing position) activity did not occur: Safety/medical concerns      Wheelchair Is the patient using a wheelchair?: No          Wheel 50 feet with 2 turns activity      Wheel 150 feet activity        Refer to Care Plan for Long Term Goals  SHORT TERM GOAL WEEK 1 PT Short Term Goal 1 (Week 1): STGs = LTGs  Recommendations for other services: None   Skilled Therapeutic Intervention PT evaluation completed and treatment plan initiated. Pt required multiple rest breaks due to decreased activity tolerance. Pt performed multiple sit to stand transfers with c/g  and stand pivot transfers with c/g to min A with verbal cues. Pt ambulated 50 feet without assistive device and c.g to min A with 1 liter O2. At end of treatment, pt returned to room and left sitting up in bed with all needs within reach.    Discharge Criteria: Patient will be discharged from PT if patient refuses treatment 3 consecutive times  without medical reason, if treatment goals not met, if there is a change in medical status, if patient makes no progress towards goals or if patient is discharged from hospital.  The above assessment, treatment plan, treatment alternatives and goals were discussed and mutually agreed upon: by patient and by family  Dub Amis 01/19/2022, 4:00 PM

## 2022-01-19 NOTE — Progress Notes (Signed)
PROGRESS NOTE   Subjective/Complaints: Pt had a pretty good night. O2 down to1L. Still with intermittent dry cough. Excited to start therapies today  ROS: Patient denies fever, rash, sore throat, blurred vision, dizziness, nausea, vomiting, diarrhea, chest pain, joint or back/neck pain, headache, or mood change.    Objective:   No results found. No results for input(s): "WBC", "HGB", "HCT", "PLT" in the last 72 hours. No results for input(s): "NA", "K", "CL", "CO2", "GLUCOSE", "BUN", "CREATININE", "CALCIUM" in the last 72 hours.  Intake/Output Summary (Last 24 hours) at 01/19/2022 1301 Last data filed at 01/19/2022 0454 Gross per 24 hour  Intake 120 ml  Output 1200 ml  Net -1080 ml        Physical Exam: Vital Signs Blood pressure 123/82, pulse 86, temperature 98.2 F (36.8 C), temperature source Oral, resp. rate 16, height '5\' 6"'$  (1.676 m), weight 51.6 kg, SpO2 100 %.  General: Alert and oriented x 3, No apparent distress HEENT: Head is normocephalic, atraumatic, PERRLA, EOMI, sclera anicteric, oral mucosa pink and moist, dentition intact, ext ear canals clear,  Neck: Supple without JVD or lymphadenopathy Heart: Reg rate and rhythm with occ PAC. No murmurs rubs or gallops Chest: CTA bilaterally without wheezes, rales, or rhonchi; no distress. With deep inspiration it tends to trigger cough Abdomen: Soft, non-tender, non-distended, bowel sounds positive. Extremities: No clubbing, cyanosis, or edema. Pulses are 2+ Psych: Pt's affect is appropriate. Pt is cooperative Skin: Clean and intact without signs of breakdown Neuro:  Alert and oriented x 3. Normal insight and awareness. Intact Memory. Normal language and speech. Cranial nerve exam unremarkable. Motor 4/5 UE. LE 3/5 HF, 4/5 ADF/PF. No focal sensory findings Musculoskeletal: Full ROM, No pain with AROM or PROM in the neck, trunk, or extremities. Posture appropriate      Assessment/Plan: 1. Functional deficits which require 3+ hours per day of interdisciplinary therapy in a comprehensive inpatient rehab setting. Physiatrist is providing close team supervision and 24 hour management of active medical problems listed below. Physiatrist and rehab team continue to assess barriers to discharge/monitor patient progress toward functional and medical goals  Care Tool:  Bathing              Bathing assist Assist Level: Minimal Assistance - Patient > 75%     Upper Body Dressing/Undressing Upper body dressing        Upper body assist Assist Level: Set up assist    Lower Body Dressing/Undressing Lower body dressing            Lower body assist Assist for lower body dressing: Minimal Assistance - Patient > 75%     Toileting Toileting    Toileting assist Assist for toileting: Minimal Assistance - Patient > 75% (simulation)     Transfers Chair/bed transfer  Transfers assist     Chair/bed transfer assist level: Minimal Assistance - Patient > 75%     Locomotion Ambulation   Ambulation assist      Assist level: Minimal Assistance - Patient > 75% Assistive device: No Device Max distance: 100   Walk 10 feet activity   Assist     Assist level: Minimal Assistance - Patient >  75% Assistive device: No Device   Walk 50 feet activity   Assist    Assist level: Minimal Assistance - Patient > 75% Assistive device: No Device    Walk 150 feet activity   Assist Walk 150 feet activity did not occur: Safety/medical concerns         Walk 10 feet on uneven surface  activity   Assist Walk 10 feet on uneven surfaces activity did not occur: Safety/medical concerns         Wheelchair     Assist Is the patient using a wheelchair?: No             Wheelchair 50 feet with 2 turns activity    Assist            Wheelchair 150 feet activity     Assist          Blood pressure 123/82, pulse 86,  temperature 98.2 F (36.8 C), temperature source Oral, resp. rate 16, height '5\' 6"'$  (1.676 m), weight 51.6 kg, SpO2 100 %.  Medical Problem List and Plan: 1. Functional deficits secondary to debility after COVID/respiratory failure             -patient may shower             -ELOS/Goals: 7-14 days, supervision to mod I with PT,OT  -Patient is beginning CIR therapies today including PT and OT  2.  Antithrombotics: -DVT/anticoagulation:  Pharmaceutical: Lovenox             -antiplatelet therapy: N/A 3. Pain Management: Oxycodone prn. Lidocaine patch for local measures.  4. Mood/Behavior/Sleep: LCSW to follow for evaluation and support.              -antipsychotic agents: N/a 5. Neuropsych/cognition: This patient is capable of making decisions on his own behalf. 6. Skin/Wound Care: Routine pressure relief measures             --maintain adequate nutritional status.  7. Fluids/Electrolytes/Nutrition: Monitor I/O. Check CMET on 09/25             -family bringing in food from home 8. IPF: Prednisone to be tapered every 14 days.             --wean oxygen as tolerated to off.              --Esbriet one pill TID X one week -->2 pills TID and hold there.  9. Prediabetes/steroid induced hyperglycemia: Hgb A1C-5.8 and anticipate that BS will improve with taper and increase in activity.              --change Ensure to Ensure Max with meals.  --Continue Levemir with SSI for elevated BS CBG (last 3)  Recent Labs    01/18/22 2116 01/19/22 0646 01/19/22 1155  GLUCAP 262* 73 127*                --9/23 sugars a bit labile. Follow for pattern.  10. H/o Prostate cancer/Urinary retention: Continue foley to SD. On flomax.  60. H/o dysphagia/Esophageal Web: Soft food. Pills with puree.              -food from home 12. Cough: continue Tessalon perles bid.   -add scheduled robitussin DM to help with dry cough             -see #8    LOS: 1 days A FACE TO FACE EVALUATION WAS PERFORMED  Meredith Staggers 01/19/2022, 1:01 PM

## 2022-01-19 NOTE — Evaluation (Addendum)
Occupational Therapy Assessment and Plan  Patient Details  Name: Jeff Willis MRN: [id number] Date of Birth: 1941/03/29  OT Diagnosis: abnormal posture Rehab Potential: Rehab Potential (ACUTE ONLY): Good ELOS: 7-10 days   Today's Date: 01/19/2022 OT Individual Time: 7903-8333 OT Individual Time Calculation (min): 73 min     Hospital Problem: Principal Problem:   Debility   Past Medical History:  Past Medical History:  Diagnosis Date   Back pain    Elevated PSA    Essential tremor    Hypercholesteremia    Neuropathy    Prostate cancer ([place])    Sinus tachycardia 01/30/2018   Past Surgical History:  Past Surgical History:  Procedure Laterality Date   COLONOSCOPY WITH PROPOFOL N/A 08/10/2021   Procedure: COLONOSCOPY WITH PROPOFOL;  Surgeon: Carol Ada, MD;  Location: [facility];  Service: Gastroenterology;  Laterality: N/A;   ESOPHAGEAL DILATION  08/10/2021   Procedure: ESOPHAGEAL DILATION;  Surgeon: Carol Ada, MD;  Location: [facility];  Service: Gastroenterology;;   ESOPHAGOGASTRODUODENOSCOPY (EGD) WITH PROPOFOL N/A 08/10/2021   Procedure: ESOPHAGOGASTRODUODENOSCOPY (EGD) WITH PROPOFOL;  Surgeon: Carol Ada, MD;  Location: [facility];  Service: Gastroenterology;  Laterality: N/A;   HEMOSTASIS CLIP PLACEMENT  08/10/2021   Procedure: HEMOSTASIS CLIP PLACEMENT;  Surgeon: Carol Ada, MD;  Location: [facility];  Service: Gastroenterology;;   LUMBAR MICRODISCECTOMY     L4-5   POLYPECTOMY  08/10/2021   Procedure: POLYPECTOMY;  Surgeon: Carol Ada, MD;  Location: [facility];  Service: Gastroenterology;;   RADIOACTIVE SEED IMPLANT N/A 02/24/2018   Procedure: RADIOACTIVE SEED IMPLANT/BRACHYTHERAPY IMPLANT;  Surgeon: Festus Aloe, MD;  Location: [facility];  Service: Urology;  Laterality: N/A;   RIGHT HEART CATH N/A 11/22/2021   Procedure: RIGHT HEART CATH;  Surgeon: Troy Sine, MD;  Location: [facility];  Service:  Cardiovascular;  Laterality: N/A;   SPACE OAR INSTILLATION N/A 02/24/2018   Procedure: SPACE OAR INSTILLATION;  Surgeon: Festus Aloe, MD;  Location: [facility];  Service: Urology;  Laterality: N/A;    Assessment & Plan Clinical Impression: Jeff Willis is an 81 year old male with history of IPF w/UIP on Esbriet, HTN, prostate CA, chronic LBP, esophageal web s/p dilatation X 2 who was admitted on 12/31/21 after hypoxia, tachycardia and low grade fever due to covid dx towards the end of his cruise. He was treated with 5 day course antibiotics as well as Remsdesivir, Steriods and Actemera. Chest CT negative for PE but showed Left > Right diffuse groundglass opacities. Respiratory status worsened requiring intubation  09/05- 09/08 as well as levophed due to  hypotension. He was extubated to 100% HFNC, on tube feeds for nutritional support and diet slowly advanced. He reported worsening of LBP and MRI spine done revealingL5/S1 SNP effacing right lateral recss and contacting right S1 nerve root, edema L4 and L3 --nonspecific, L3/L4 mild canal stenosis and L4/L5 and L5/S1 mild neural foraminal narrowing. Gabapentin added with improvement in pain control.    Elevated D dimer felt to be in setting of Covid as LE dopplers negative for DVT and CTA chest negative for acute PE.  MBS done without aspiration but 13 mm pill briefly lodges in vallecular space. Abdominal distension resolved with titration of laxatives and BS noted to be elevated due to steroids and Levemir added. FIO2 slowly being weaned down. On 09/14, he developed acute hypoxemic respiratory failure due to aspiration PNA/pneumonitis and was started on IV solumedrol with Unasyn. Respiratory status improved with treatment  and he was weaned down to 2-4 L per [place]. Tachycardia resolved with addition of BB.    Dr. Gloriann Loan consulted 09/19 for urinary retention and recommended foley placement with addition of Flomax and voiding trial  in a week. Solumedrol tapered to slow prednisone taper and home Esbriet to start at one bill TID for a week followed by 2 pills TID starting today. He continues to be limited by weakness with frequent bouts of coughing, hypoxia with activity as well as deconditioning..  Patient transferred to CIR on 01/18/2022 .    Patient currently requires min with basic self-care skills secondary to muscle weakness, decreased cardiorespiratoy endurance and decreased oxygen support, and decreased standing balance, decreased postural control, and decreased balance strategies.  Prior to hospitalization, patient could complete ADLs and IADLs with independence.  Patient will benefit from skilled intervention to decrease level of assist with basic self-care skills prior to discharge home with care partner.  Anticipate patient will require 24 hour supervision and follow up home health.  OT - End of Session Activity Tolerance: Tolerates 30+ min activity with multiple rests Endurance Deficit: Yes Endurance Deficit Description: very limited standing tolerance; SPO2 desats to 87% during light activity on 1L via [place], therefore increased to 2L and increased to 92-95%. OT Assessment Rehab Potential (ACUTE ONLY): Good OT Barriers to Discharge: New oxygen OT Patient demonstrates impairments in the following area(s): Balance;Endurance;Nutrition;Safety;Skin Integrity OT Basic ADL's Functional Problem(s): Grooming;Bathing;Dressing;Toileting OT Advanced ADL's Functional Problem(s): Simple Meal Preparation;Light Housekeeping OT Transfers Functional Problem(s): Toilet;Tub/Shower OT Additional Impairment(s): Fuctional Use of Upper Extremity OT Plan OT Intensity: Minimum of 1-2 x/day, 45 to 90 minutes OT Frequency: 5 out of 7 days OT Duration/Estimated Length of Stay: 2 weeks OT Treatment/Interventions: Balance/vestibular training;DME/adaptive equipment instruction;Patient/family education;Therapeutic Activities;Wheelchair  propulsion/positioning;Psychosocial support;Therapeutic Exercise;Community reintegration;Functional mobility training;Self Care/advanced ADL retraining;UE/LE Strength taining/ROM;Discharge planning;Neuromuscular re-education;UE/LE Coordination activities;Skin care/wound managment;Disease mangement/prevention;Pain management OT Self Feeding Anticipated Outcome(s): independent OT Basic Self-Care Anticipated Outcome(s): setup-distant supervision OT Toileting Anticipated Outcome(s): mod I OT Bathroom Transfers Anticipated Outcome(s): distant supervision OT Recommendation Patient destination: Home Follow Up Recommendations: Home health OT;Outpatient OT Equipment Recommended: To be determined   OT Evaluation Precautions/Restrictions  Precautions Precautions: Fall Precaution Comments: Watch SpO2 and HR Restrictions Weight Bearing Restrictions: No Pain Pain Assessment Pain Scale: 0-10 Pain Score: 0-No pain Home Living/Prior Functioning Home Living Available Help at Discharge: Family, Available 24 hours/day Type of Home: House Home Access: Stairs to enter [employer] of Steps: 2 Entrance Stairs-Rails: None Home Layout: Two level, Able to live on main level with bedroom/bathroom Bathroom Shower/Tub: Tub/shower unit, Industrial/product designer: Yes  Lives With: Family Prior Function Level of Independence: Independent with basic ADLs, Independent with transfers, Independent with homemaking with ambulation, Independent with gait Vision Baseline Vision/History: 1 Wears glasses (reading glasses) Ability to See in Adequate Light: 0 Adequate Patient Visual Report: No change from baseline Vision Assessment?: No apparent visual deficits Perception  Perception: Within Functional Limits Praxis Praxis: Intact Cognition Cognition Overall Cognitive Status: Within Functional Limits for tasks assessed Arousal/Alertness: Awake/alert Orientation Level:  Person;Place;Situation Person: Oriented Place: Oriented Situation: Oriented Memory: Appears intact Awareness: Appears intact Problem Solving: Appears intact Safety/Judgment: Appears intact Brief Interview for Mental Status (BIMS) Repetition of Three Words (First Attempt): 3 Temporal Orientation: Year: Correct Temporal Orientation: Month: Accurate within 5 days Temporal Orientation: Day: Correct Recall: "Sock": Yes, no cue required Recall: "Blue": Yes, no cue required Recall: "Bed": Yes, no cue required BIMS Summary Score: 15 Sensation Sensation Light  Touch: Appears Intact (reports burning in feet at baseline; treated by gabapentin) Hot/Cold: Appears Intact Proprioception: Appears Intact Coordination Gross Motor Movements are Fluid and Coordinated: Yes Fine Motor Movements are Fluid and Coordinated: Yes Finger Nose Finger Test: WNL BUE; mild to moderate tremor bilateral hands at baseline Motor  Motor Motor: Within Functional Limits  Trunk/Postural Assessment  Cervical Assessment Cervical Assessment: Within Functional Limits Thoracic Assessment Thoracic Assessment: Exceptions to [facility] (rounded shoulders) Lumbar Assessment Lumbar Assessment: Exceptions to [facility] (posterior pelvic tilt) Postural Control Postural Control: Within Functional Limits  Balance Balance Balance Assessed: Yes Static Sitting Balance Static Sitting - Balance Support: Feet supported Static Sitting - Level of Assistance: 5: Stand by assistance Dynamic Sitting Balance Dynamic Sitting - Balance Support: Feet supported Dynamic Sitting - Level of Assistance: 5: Stand by assistance Static Standing Balance Static Standing - Balance Support: Left upper extremity supported;Right upper extremity supported Static Standing - Level of Assistance: 5: Stand by assistance Dynamic Standing Balance Dynamic Standing - Balance Support: Bilateral upper extremity supported Dynamic Standing - Level of Assistance: 4: Min  assist Extremity/Trunk Assessment RUE Assessment General Strength Comments: 4-/5 LUE Assessment General Strength Comments: 4/5  Care Tool Care Tool Self Care Eating   Eating Assist Level: Set up assist    Oral Care    Oral Care Assist Level: Contact Guard/Toucning assist    Bathing         Assist Level: Minimal Assistance - Patient > 75%    Upper Body Dressing(including orthotics)       Assist Level: Set up assist    Lower Body Dressing (excluding footwear)     Assist for lower body dressing: Minimal Assistance - Patient > 75%    Putting on/Taking off footwear     Assist for footwear: Set up assist       Care Tool Toileting Toileting activity   Assist for toileting: Minimal Assistance - Patient > 75% (simulation)     Care Tool Bed Mobility Roll left and right activity        Sit to lying activity        Lying to sitting on side of bed activity         Care Tool Transfers Sit to stand transfer   Sit to stand assist level: Contact Guard/Touching assist    Chair/bed transfer   Chair/bed transfer assist level: Minimal Assistance - Patient > 75%     Toilet transfer   Assist Level: Minimal Assistance - Patient > 75%     Care Tool Cognition  Expression of Ideas and Wants Expression of Ideas and Wants: 4. Without difficulty (complex and basic) - expresses complex messages without difficulty and with speech that is clear and easy to understand  Understanding Verbal and Non-Verbal Content Understanding Verbal and Non-Verbal Content: 4. Understands (complex and basic) - clear comprehension without cues or repetitions   Memory/Recall Ability Memory/Recall Ability : Current season;That he or she is in a hospital/hospital unit   Refer to Care Plan for Long Term Goals  SHORT TERM GOAL WEEK 1 OT Short Term Goal 1 (Week 1): STGs=LTGs due to ELOS OT Short Term Goal 2 (Week 1): Patient  Recommendations for other services: None    Skilled Therapeutic  Intervention ADL ADL Grooming: Contact guard Where Assessed-Grooming: Standing at sink Upper Body Bathing: Minimal assistance Where Assessed-Upper Body Bathing: Sitting at sink Lower Body Bathing: Minimal assistance Where Assessed-Lower Body Bathing: Standing at sink;Sitting at sink Upper Body Dressing: Setup Where Assessed-Upper Body  Dressing: Sitting at sink Lower Body Dressing: Minimal assistance Where Assessed-Lower Body Dressing: Sitting at sink;Standing at sink Toileting: Minimal assistance Where Assessed-Toileting: Glass blower/designer: Therapist, music Method: Counselling psychologist: Grab bars Mobility  Transfers Sit to Stand: Contact Guard/Touching assist Stand to Sit: Contact Guard/Touching assist  Skilled Treatment: Pt semi reclined in bed at OT arrival, no c/o pain, son present throughout session and very supportive of his father.  IE completed and patient and son educated on weekly conference, typical frequency and duration, and importance of requesting assist when needing to mobilize.  Collaborated with patient and son regarding OT POC.  Pt on 1L O2 via [place] at beginning of session with SPO2 reading 91%.  Pt completed supine to sit, sit to stand at RW and ambulated a few feet to sink with min assist for O2 tubing and catheter bag mgt and CGA for balance.  Pt completed bathing and dressing at sit<>stand level at sink per above levels of assist.  Needing frequent seated rest breaks, and SPO2 assessed intermittently throughout reading 87-91% on 1L.  Increased to 2L with light activity and SPO2 increased to 94-97%..  Pt frequently coughing throughout session, per son, patient coughed a lot at baseline secondary to diagnosis of pulmonary fibrosis.  Educated patient and provided frequent intermittent multimodal cues to implement PLB.  Pt with good follow through however having difficulty maintaining when coughing.  Pt needing education on catheter bag  management during LB dressing.  Pt ambulated a few steps to EOB and completed sit to supine with CGA and assist for cathetar bag and O2 tubing.  SPO2 at 95% on 1L at end of session.  Notified nurse of patients SPO2 levels during session, and current functional transfer status.  Also notified nurse of patients lidocaine patch possibly needing adjustment/replacement.  Call bell in reach, bed alarm on.     Discharge Criteria: Patient will be discharged from OT if patient refuses treatment 3 consecutive times without medical reason, if treatment goals not met, if there is a change in medical status, if patient makes no progress towards goals or if patient is discharged from hospital.  The above assessment, treatment plan, treatment alternatives and goals were discussed and mutually agreed upon: by patient  Ezekiel Slocumb 01/19/2022, 12:43 PM

## 2022-01-19 NOTE — Plan of Care (Signed)
  Problem: RH Balance Goal: LTG Patient will maintain dynamic standing with ADLs (OT) Description: LTG:  Patient will maintain dynamic standing balance with assist during activities of daily living (OT)  Flowsheets (Taken 01/19/2022 1244) LTG: Pt will maintain dynamic standing balance during ADLs with: Independent with assistive device   Problem: Sit to Stand Goal: LTG:  Patient will perform sit to stand in prep for activites of daily living with assistance level (OT) Description: LTG:  Patient will perform sit to stand in prep for activites of daily living with assistance level (OT) Flowsheets (Taken 01/19/2022 1244) LTG: PT will perform sit to stand in prep for activites of daily living with assistance level: Independent with assistive device   Problem: RH Bathing Goal: LTG Patient will bathe all body parts with assist levels (OT) Description: LTG: Patient will bathe all body parts with assist levels (OT) Flowsheets (Taken 01/19/2022 1244) LTG: Pt will perform bathing with assistance level/cueing: Supervision/Verbal cueing LTG: Position pt will perform bathing: Shower   Problem: RH Dressing Goal: LTG Patient will perform lower body dressing w/assist (OT) Description: LTG: Patient will perform lower body dressing with assist, with/without cues in positioning using equipment (OT) Flowsheets (Taken 01/19/2022 1244) LTG: Pt will perform lower body dressing with assistance level of: Independent with assistive device   Problem: RH Toileting Goal: LTG Patient will perform toileting task (3/3 steps) with assistance level (OT) Description: LTG: Patient will perform toileting task (3/3 steps) with assistance level (OT)  Flowsheets (Taken 01/19/2022 1244) LTG: Pt will perform toileting task (3/3 steps) with assistance level: Independent with assistive device   Problem: RH Simple Meal Prep Goal: LTG Patient will perform simple meal prep w/assist (OT) Description: LTG: Patient will perform simple  meal prep with assistance, with/without cues (OT). Flowsheets (Taken 01/19/2022 1244) LTG: Pt will perform simple meal prep with assistance level of: Supervision/Verbal cueing LTG: Pt will perform simple meal prep w/level of: Ambulate with device   Problem: RH Toilet Transfers Goal: LTG Patient will perform toilet transfers w/assist (OT) Description: LTG: Patient will perform toilet transfers with assist, with/without cues using equipment (OT) Flowsheets (Taken 01/19/2022 1246) LTG: Pt will perform toilet transfers with assistance level of: Independent with assistive device   Problem: RH Tub/Shower Transfers Goal: LTG Patient will perform tub/shower transfers w/assist (OT) Description: LTG: Patient will perform tub/shower transfers with assist, with/without cues using equipment (OT) Flowsheets (Taken 01/19/2022 1246) LTG: Pt will perform tub/shower stall transfers with assistance level of: Supervision/Verbal cueing LTG: Pt will perform tub/shower transfers from: Tub/shower combination

## 2022-01-20 DIAGNOSIS — R1319 Other dysphagia: Secondary | ICD-10-CM | POA: Diagnosis not present

## 2022-01-20 DIAGNOSIS — R5381 Other malaise: Secondary | ICD-10-CM | POA: Diagnosis not present

## 2022-01-20 DIAGNOSIS — J8410 Pulmonary fibrosis, unspecified: Secondary | ICD-10-CM | POA: Diagnosis not present

## 2022-01-20 DIAGNOSIS — R339 Retention of urine, unspecified: Secondary | ICD-10-CM | POA: Diagnosis not present

## 2022-01-20 LAB — GLUCOSE, CAPILLARY
Glucose-Capillary: 85 mg/dL (ref 70–99)
Glucose-Capillary: 170 mg/dL — ABNORMAL HIGH (ref 70–99)
Glucose-Capillary: 130 mg/dL — ABNORMAL HIGH (ref 70–99)
Glucose-Capillary: 230 mg/dL — ABNORMAL HIGH (ref 70–99)

## 2022-01-20 MED ORDER — INSULIN DETEMIR 100 UNIT/ML ~~LOC~~ SOLN
9.0000 [IU] | Freq: Every day | SUBCUTANEOUS | Status: DC
  Filled 2022-01-20: qty 0.09

## 2022-01-20 MED ORDER — INSULIN DETEMIR 100 UNIT/ML ~~LOC~~ SOLN
3.0000 [IU] | Freq: Once | SUBCUTANEOUS | Status: AC
  Administered 2022-01-20: 3 [IU] via SUBCUTANEOUS
  Filled 2022-01-20: qty 0.03

## 2022-01-20 NOTE — Progress Notes (Signed)
PROGRESS NOTE   Subjective/Complaints: Had a good day with therapies. Still with cough. Tessalon helps.   ROS: Patient denies fever, rash, sore throat, blurred vision, dizziness, nausea, vomiting, diarrhea,  chest pain, joint or back/neck pain, headache, or mood change. .    Objective:   No results found. No results for input(s): "WBC", "HGB", "HCT", "PLT" in the last 72 hours. No results for input(s): "NA", "K", "CL", "CO2", "GLUCOSE", "BUN", "CREATININE", "CALCIUM" in the last 72 hours.  Intake/Output Summary (Last 24 hours) at 01/20/2022 0914 Last data filed at 01/19/2022 2224 Gross per 24 hour  Intake 240 ml  Output 1500 ml  Net -1260 ml        Physical Exam: Vital Signs Blood pressure 105/60, pulse 87, temperature 98.4 F (36.9 C), temperature source Oral, resp. rate 16, height '5\' 6"'$  (1.676 m), weight 50.9 kg, SpO2 100 %.  Constitutional: No distress . Vital signs reviewed. HEENT: NCAT, EOMI, oral membranes moist Neck: supple Cardiovascular: RRR without murmur. No JVD    Respiratory/Chest: CTA Bilaterally without wheezes or rales. Normal effort. Less coughing today with deep inspiration. 1L O2 [place]  GI/Abdomen: BS +, non-tender, non-distended Ext: no clubbing, cyanosis, or edema Psych: pleasant and cooperative  Skin: Clean and intact without signs of breakdown Uro: urine clear in foley bag Neuro:  Alert and oriented x 3. Normal insight and awareness. Intact Memory. Normal language and speech. Cranial nerve exam unremarkable. Motor 4/5 UE. LE 3/5 HF, 4/5 ADF/PF. No focal sensory findings--stable exam Musculoskeletal: Full ROM, No pain with AROM or PROM in the neck, trunk, or extremities. Posture appropriate     Assessment/Plan: 1. Functional deficits which require 3+ hours per day of interdisciplinary therapy in a comprehensive inpatient rehab setting. Physiatrist is providing close team supervision and 24 hour  management of active medical problems listed below. Physiatrist and rehab team continue to assess barriers to discharge/monitor patient progress toward functional and medical goals  Care Tool:  Bathing              Bathing assist Assist Level: Minimal Assistance - Patient > 75%     Upper Body Dressing/Undressing Upper body dressing        Upper body assist Assist Level: Set up assist    Lower Body Dressing/Undressing Lower body dressing            Lower body assist Assist for lower body dressing: Minimal Assistance - Patient > 75%     Toileting Toileting    Toileting assist Assist for toileting: Minimal Assistance - Patient > 75% (simulation)     Transfers Chair/bed transfer  Transfers assist     Chair/bed transfer assist level: Contact Guard/Touching assist     Locomotion Ambulation   Ambulation assist      Assist level: Contact Guard/Touching assist Assistive device: Walker-rolling Max distance: 100   Walk 10 feet activity   Assist     Assist level: Contact Guard/Touching assist Assistive device: Walker-rolling   Walk 50 feet activity   Assist    Assist level: Contact Guard/Touching assist Assistive device: Walker-rolling    Walk 150 feet activity   Assist Walk 150 feet activity did not  occur: Safety/medical concerns         Walk 10 feet on uneven surface  activity   Assist Walk 10 feet on uneven surfaces activity did not occur: Safety/medical concerns         Wheelchair     Assist Is the patient using a wheelchair?: No             Wheelchair 50 feet with 2 turns activity    Assist            Wheelchair 150 feet activity     Assist          Blood pressure 105/60, pulse 87, temperature 98.4 F (36.9 C), temperature source Oral, resp. rate 16, height '5\' 6"'$  (1.676 m), weight 50.9 kg, SpO2 100 %.  Medical Problem List and Plan: 1. Functional deficits secondary to debility after  COVID/respiratory failure             -patient may shower             -ELOS/Goals: 7-14 days, supervision to mod I with PT,OT  -Continue CIR therapies including PT, OT  2.  Antithrombotics: -DVT/anticoagulation:  Pharmaceutical: Lovenox             -antiplatelet therapy: N/A 3. Pain Management: Oxycodone prn. Lidocaine patch for local measures.  4. Mood/Behavior/Sleep: LCSW to follow for evaluation and support.              -antipsychotic agents: N/a 5. Neuropsych/cognition: This patient is capable of making decisions on his own behalf. 6. Skin/Wound Care: Routine pressure relief measures             --maintain adequate nutritional status.  7. Fluids/Electrolytes/Nutrition: Monitor I/O. Check CMET on 09/25             -family bringing in food from home 8. IPF: Prednisone to be tapered every 14 days.             --wean oxygen as tolerated to off.              --Esbriet one pill TID X one week -->2 pills TID and hold there.  9. Prediabetes/steroid induced hyperglycemia: Hgb A1C-5.8 and anticipate that BS will improve with taper and increase in activity.              --change Ensure to Ensure Max with meals.  --Continue Levemir with SSI for elevated BS CBG (last 3)  Recent Labs    01/19/22 1732 01/19/22 2106 01/20/22 0635  GLUCAP 158* 283* 85                --9/24 sugars still elevated especially in PM hours. -increase levemir to 9u, give addnl 3u this am -discussed dietary choices with pt and son.  Don't want to restrict what he eats too much as he needs to put on weight. Also steroids on slow wean. 10. H/o Prostate cancer/Urinary retention: Continue foley to SD. On flomax.  82. H/o dysphagia/Esophageal Web: Soft food. Pills with puree.              -food from home 12. Cough: continue Tessalon perles bid.   -added scheduled robitussin DM TID to help with dry cough             -see #8    LOS: 2 days A FACE TO FACE EVALUATION WAS PERFORMED  Meredith Staggers 01/20/2022, 9:14 AM

## 2022-01-21 DIAGNOSIS — R5381 Other malaise: Secondary | ICD-10-CM | POA: Diagnosis not present

## 2022-01-21 LAB — MAGNESIUM: Magnesium: 2.2 mg/dL (ref 1.7–2.4)

## 2022-01-21 LAB — COMPREHENSIVE METABOLIC PANEL
ALT: 90 U/L — ABNORMAL HIGH (ref 0–44)
AST: 29 U/L (ref 15–41)
Albumin: 3.1 g/dL — ABNORMAL LOW (ref 3.5–5.0)
Alkaline Phosphatase: 68 U/L (ref 38–126)
Anion gap: 6 (ref 5–15)
BUN: 15 mg/dL (ref 8–23)
CO2: 30 mmol/L (ref 22–32)
Calcium: 9.2 mg/dL (ref 8.9–10.3)
Chloride: 102 mmol/L (ref 98–111)
Creatinine, Ser: 1.04 mg/dL (ref 0.61–1.24)
GFR, Estimated: >60 mL/min (ref >60)
Glucose, Bld: 155 mg/dL — ABNORMAL HIGH (ref 70–99)
Potassium: 3.4 mmol/L — ABNORMAL LOW (ref 3.5–5.1)
Sodium: 138 mmol/L (ref 135–145)
Total Bilirubin: 0.7 mg/dL (ref 0.3–1.2)
Total Protein: 5.5 g/dL — ABNORMAL LOW (ref 6.5–8.1)

## 2022-01-21 LAB — CBC WITH DIFFERENTIAL/PLATELET
Abs Immature Granulocytes: 0.03 10*3/uL (ref 0.00–0.07)
Basophils Absolute: 0 10*3/uL (ref 0.0–0.1)
Basophils Relative: 0 %
Eosinophils Absolute: 0.3 10*3/uL (ref 0.0–0.5)
Eosinophils Relative: 3 %
HCT: 37.4 % — ABNORMAL LOW (ref 39.0–52.0)
Hemoglobin: 12.8 g/dL — ABNORMAL LOW (ref 13.0–17.0)
Immature Granulocytes: 0 %
Lymphocytes Relative: 18 %
Lymphs Abs: 1.6 10*3/uL (ref 0.7–4.0)
MCH: 32.6 pg (ref 26.0–34.0)
MCHC: 34.2 g/dL (ref 30.0–36.0)
MCV: 95.2 fL (ref 80.0–100.0)
Monocytes Absolute: 0.4 10*3/uL (ref 0.1–1.0)
Monocytes Relative: 5 %
Neutro Abs: 6.3 10*3/uL (ref 1.7–7.7)
Neutrophils Relative %: 74 %
Platelets: 197 10*3/uL (ref 150–400)
RBC: 3.93 MIL/uL — ABNORMAL LOW (ref 4.22–5.81)
RDW: 13.6 % (ref 11.5–15.5)
WBC: 8.6 10*3/uL (ref 4.0–10.5)
nRBC: 0 % (ref 0.0–0.2)

## 2022-01-21 LAB — GLUCOSE, CAPILLARY
Glucose-Capillary: 75 mg/dL (ref 70–99)
Glucose-Capillary: 135 mg/dL — ABNORMAL HIGH (ref 70–99)
Glucose-Capillary: 222 mg/dL — ABNORMAL HIGH (ref 70–99)
Glucose-Capillary: 208 mg/dL — ABNORMAL HIGH (ref 70–99)

## 2022-01-21 LAB — VITAMIN D 25 HYDROXY (VIT D DEFICIENCY, FRACTURES): Vit D, 25-Hydroxy: 28.48 ng/mL — ABNORMAL LOW (ref 30–100)

## 2022-01-21 MED ORDER — INSULIN DETEMIR 100 UNIT/ML ~~LOC~~ SOLN
8.0000 [IU] | Freq: Every day | SUBCUTANEOUS | Status: DC
  Administered 2022-01-21: 8 [IU] via SUBCUTANEOUS
  Filled 2022-01-21 (×2): qty 0.08

## 2022-01-21 MED ORDER — METFORMIN HCL 500 MG PO TABS: 500.0000 mg | ORAL_TABLET | Freq: Every day | ORAL | Status: DC

## 2022-01-21 MED ORDER — VITAMIN D (ERGOCALCIFEROL) 1.25 MG (50000 UNIT) PO CAPS
50000.0000 [IU] | ORAL_CAPSULE | ORAL | Status: DC
  Administered 2022-01-21 – 2022-01-28 (×2): 50000 [IU] via ORAL
  Filled 2022-01-21 (×2): qty 1

## 2022-01-21 MED ORDER — POTASSIUM CHLORIDE 20 MEQ PO PACK
40.0000 meq | PACK | Freq: Once | ORAL | Status: AC
  Administered 2022-01-21: 40 meq via ORAL
  Filled 2022-01-21: qty 2

## 2022-01-21 MED ORDER — METFORMIN HCL 500 MG PO TABS
500.0000 mg | ORAL_TABLET | Freq: Two times a day (BID) | ORAL | Status: DC
  Administered 2022-01-22 – 2022-01-24 (×5): 500 mg via ORAL
  Filled 2022-01-21 (×5): qty 1

## 2022-01-21 MED ORDER — INSULIN DETEMIR 100 UNIT/ML ~~LOC~~ SOLN
7.0000 [IU] | Freq: Every day | SUBCUTANEOUS | Status: DC
  Administered 2022-01-22 – 2022-01-23 (×2): 7 [IU] via SUBCUTANEOUS
  Filled 2022-01-21 (×3): qty 0.07

## 2022-01-21 NOTE — Progress Notes (Signed)
[facility] Individual Statement of Services  Patient Name:  Jeff Willis  Date:  01/21/2022  Welcome to the [facility].  Our goal is to provide you with an individualized program based on your diagnosis and situation, designed to meet your specific needs.  With this comprehensive rehabilitation program, you will be expected to participate in at least 3 hours of rehabilitation therapies Monday-Friday, with modified therapy programming on the weekends.  Your rehabilitation program will include the following services:  Physical Therapy (PT), Occupational Therapy (OT), Speech Therapy (ST), 24 hour per day rehabilitation nursing, Therapeutic Recreaction (TR), Neuropsychology, Care Coordinator, Rehabilitation Medicine, Nutrition Services, Pharmacy Services, and Other  Weekly team conferences will be held on Wdnesdays to discuss your progress.  Your Inpatient Rehabilitation Care Coordinator will talk with you frequently to get your input and to update you on team discussions.  Team conferences with you and your family in attendance may also be held.  Expected length of stay: 7-14 Days  Overall anticipated outcome:  Supervision to MOD I  Depending on your progress and recovery, your program may change. Your Inpatient Rehabilitation Care Coordinator will coordinate services and will keep you informed of any changes. Your Inpatient Rehabilitation Care Coordinator's name and contact numbers are listed  below.  The following services may also be recommended but are not provided by the [facility]:   [place] will be made to provide these services after discharge if needed.  Arrangements include referral to agencies that provide these services.  Your insurance has been verified to be:   [employer] Your primary doctor is:   Gaynelle Arabian, MD  Pertinent  information will be shared with your doctor and your insurance company.  Inpatient Rehabilitation Care Coordinator:  Erlene Quan, [place] or [phone]  Information discussed with and copy given to patient by: Dyanne Iha, 01/21/2022, 10:34 AM

## 2022-01-21 NOTE — Progress Notes (Signed)
Pt daughter called this nurse in the room concerned about the patients urine appearance. Pt urine displaying bright red with small amounts of blood clots throughout foley. This nurse emptied the amount for further observation throughout the night.

## 2022-01-21 NOTE — Progress Notes (Signed)
Occupational Therapy Session Note  Patient Details  Name: Jeff Willis MRN: [id number] Date of Birth: November 29, 1940  Today's Date: 01/21/2022 OT Individual Time: 0920-1015 & 1420-1530 OT Individual Time Calculation (min): 55 min  & 70 min   Short Term Goals: Week 1:  OT Short Term Goal 1 (Week 1): STGs=LTGs due to ELOS OT Short Term Goal 2 (Week 1): Patient  Skilled Therapeutic Interventions/Progress Updates:  Session 1 Skilled OT intervention completed with focus on toileting needs, cardio/respiratory endurance within a ambulatory context to prep for BADL and home management. Pt received seated in w/c, on 1L via [place] at 96% SPO2, no c/o pain or SOB.  Pt verbalized the need to use the bathroom. Transferred pt to portable tank, remained on 1 L for bathroom transfer at the ambulatory level without AD and min A. CGA for doffing pants. Able to provide distant supervision for seated on BSC over toilet. Continent of BM- nursing notified of need of cath care 2/2 indwelling cath and BM occurrence. Education provided about catheter management to pt and his DIL regarding levels to maintain to prevent UTI/back flow etc. Completed sit > stand with CGA, with pt able to maintain balance with CGA during posterior pericare. Pt did desat to 86% with standing duration, ambulated at same assist back to w/c, with SPO2 only at 88%, therefore increased liters to 2L for further activity, which increased to 99% SPO2.   Washed hands seated at sink, with mod I. Pt frequently needed water and rest breaks 2/2 baseline cough and clearing of throat. Pt agreeable to trial ambulation in hall with RW, with pt able to walk 30 ft with CGA with therapist only assisting oxygen tank and catheter. Increased SOB and pt report of fatigue- SPO2 at 79% in stance on pulse ox (82% on dynamap), increasing to 84% with seated rest break, however increased supplemental oxygen to 3 L and with extended rest was able to titrate down to 1L with  SPO2 at 95% at end of session. Pt remained on 1L at conclusion of session. Pt remained seated in w/c with DIL present and all immediate needs met at end of session.  Session 2 Skilled OT intervention completed with focus on cardio/respiratory endurance within a shower context and BUE exercises. Pt received upright in bed, no c/o pain, agreeable to shower.  Received on 1L O2 via [place], with 100% SPO2 reading.  Completed sit > stand with CGA without AD, then ambulatory transfers in room to and from bathroom with CGA without AD. Assist needed for catheter and portable O2 tank management. Doffed shirts EOB with min A, then ambulated to tub bench and CGA shower transfer using grab bars then remained seated for duration of bathing. Pt's wife was present to participate in bathing and assisted pt with min A bathing for LB. Therapist provided education on showers with a foley, oxygen maintenance/safety including luke warm water vs hot to prevent increased steam that could challenge breathing abilities. Education also provided on maximizing pt independence by having him do as much as he can on his own vs jumping in to do things for him.  Cues needed for fall prevention including drying feet prior to standing. Min A provided for donning shirt with [place] cord management, and min A for threading RLE into pants with catheter management also. Stand pivot to w/c with CGA no AD.   In room, a prolonged seated rest break was provided for O2 management. Prior to shower, pt had SPO2 reading of  95%, however desatted to 82% after shower, and despite 3-5 minute recovery period, pt was unable to rebound >95% therefore titrated up to 2L. By end of session, pt was at 92% after titration down to 1.5 L and remained at conclusion of session due to increased coughing and pt continuing to fluctuate with O2 vitals. HR WNLs.  Pt indicated fatigue, with request to return to bed, however agreeable to exercises to continue therapy. Stand pivot  with CGA to EOB, supervision bed mobility.   Completed the following upright in bed: (With yellow theraband anchored on bed rail) x10 reps each arm Horizontal abduction/adduction Shoulder flexion Bicep flexion Chest presses Shoulder external/internal rotation  Pt remained upright in bed, with wife present, bed alarm on and all needs in reach at end of session.    Therapy Documentation Precautions:  Precautions Precautions: Fall Precaution Comments: Watch SpO2 and HR Restrictions Weight Bearing Restrictions: No    Therapy/Group: Individual Therapy  Blase Mess, MS, OTR/L  01/21/2022, 3:58 PM

## 2022-01-21 NOTE — Progress Notes (Signed)
Inpatient Rehabilitation  Patient information reviewed and entered into eRehab system by Kandra Graven M. Walton Digilio, M.A., CCC/SLP, PPS Coordinator.  Information including medical coding, functional ability and quality indicators will be reviewed and updated through discharge.    

## 2022-01-21 NOTE — IPOC Note (Signed)
Overall Plan of Care [facility]) Patient Details Name: Jeff Willis MRN: [id number] DOB: [birth year]  Admitting Diagnosis: [facility] Problems: Principal Problem:   Debility     Functional Problem List: Nursing Nutrition, Bladder, Pain, Safety, Endurance, Medication Management, Skin Integrity, Motor  PT Balance, Endurance, Motor  OT Balance, Endurance, Nutrition, Safety, Skin Integrity  SLP    TR         Basic ADL's: OT Grooming, Bathing, Dressing, Toileting     Advanced  ADL's: OT Simple Meal Preparation, Light Housekeeping     Transfers: PT Bed Mobility, Bed to Chair, Teacher, early years/pre, Tub/Shower     Locomotion: PT Ambulation, Stairs     Additional Impairments: OT Fuctional Use of Upper Extremity  SLP        TR      Anticipated Outcomes Item Anticipated Outcome  Self Feeding independent  Swallowing      Basic self-care  setup-distant supervision  Toileting  mod I   Bathroom Transfers distant supervision  Bowel/Bladder  continent x 2 LBM 09/22 foley in place  Transfers  mod I transfers  Locomotion  mod I gait and S stairs  Communication     Cognition     Pain  less than 3  Safety/Judgment  remain fall free while in rehab   Therapy Plan: PT Intensity: Minimum of 1-2 x/day ,45 to 90 minutes PT Frequency: 5 out of 7 days PT Duration Estimated Length of Stay: 7 to 10 days OT Intensity: Minimum of 1-2 x/day, 45 to 90 minutes OT Frequency: 5 out of 7 days OT Duration/Estimated Length of Stay: 7-10 days     Team Interventions: Nursing Interventions Patient/Family Education, Disease Management/Prevention, Skin Care/Wound Management, Discharge Planning, Bladder Management, Pain Management, Psychosocial Support, Medication Management  PT interventions Ambulation/gait training, Balance/vestibular training, Discharge planning, Functional mobility training, Neuromuscular re-education, Patient/family education, Stair training, Therapeutic  Exercise, UE/LE Coordination activities, UE/LE Strength taining/ROM, Therapeutic Activities  OT Interventions Balance/vestibular training, DME/adaptive equipment instruction, Patient/family education, Therapeutic Activities, Wheelchair propulsion/positioning, Psychosocial support, Therapeutic Exercise, Community reintegration, Functional mobility training, Self Care/advanced ADL retraining, UE/LE Strength taining/ROM, Discharge planning, Neuromuscular re-education, UE/LE Coordination activities, Skin care/wound managment, Disease mangement/prevention, Pain management  SLP Interventions    TR Interventions    SW/CM Interventions Discharge Planning, Psychosocial Support, Patient/Family Education, Disease Management/Prevention   Barriers to Discharge MD  Medical stability  Nursing Decreased caregiver support, Home environment access/layout, Wound Care patient and spouse live with son and family 2 level house 3 ste 0 rail to enter with B/B on main level  PT Inaccessible home environment 3 steps no rails to enter  OT New oxygen    SLP      SW Lack of/limited family support, Decreased caregiver support     Team Discharge Planning: Destination: PT-Home ,OT- Home , SLP-  Projected Follow-up: PT-Home health PT, Outpatient PT, OT-  Home health OT, Outpatient OT, SLP-  Projected Equipment Needs: PT-To be determined, OT- To be determined, SLP-  Equipment Details: PT- , OT-  Patient/family involved in discharge planning: PT- Patient, Family member/caregiver,  OT-Patient, SLP-   MD ELOS: 7-14 days Medical Rehab Prognosis:  Excellent Assessment: The patient has been admitted for CIR therapies with the diagnosis of pulmonary debility. The team will be addressing functional mobility, strength, stamina, balance, safety, adaptive techniques and equipment, self-care, bowel and bladder mgt, patient and caregiver education. Goals have been set at [place]. Anticipated discharge destination is home.  See Team Conference Notes for weekly updates to the plan of care

## 2022-01-21 NOTE — Progress Notes (Signed)
PROGRESS NOTE   Subjective/Complaints: Would like to wean off insulin and oxygen if possible prior to d/c. Willing to start metformin Feeling well and has no new complaints, prefers less medicine.   ROS: Patient denies fever, rash, sore throat, blurred vision, dizziness, nausea, vomiting, diarrhea,  chest pain, joint or back/neck pain, headache, or mood change. +difficulty swallowing   Objective:   No results found. Recent Labs    01/21/22 0901  WBC 8.6  HGB 12.8*  HCT 37.4*  PLT 197   Recent Labs    01/21/22 0901  NA 138  K 3.4*  CL 102  CO2 30  GLUCOSE 155*  BUN 15  CREATININE 1.04  CALCIUM 9.2    Intake/Output Summary (Last 24 hours) at 01/21/2022 1111 Last data filed at 01/21/2022 0815 Gross per 24 hour  Intake 120 ml  Output 2600 ml  Net -2480 ml        Physical Exam: Vital Signs Blood pressure 112/70, pulse 79, temperature 97.6 F (36.4 C), resp. rate 16, height '5\' 6"'$  (1.676 m), weight 50.9 kg, SpO2 100 %.  Constitutional: No distress . Vital signs reviewed. BMI 18.11 HEENT: NCAT, EOMI, oral membranes moist Neck: supple Cardiovascular: RRR without murmur. No JVD    Respiratory/Chest: CTA Bilaterally without wheezes or rales. Normal effort. Less coughing today with deep inspiration. 1L O2 [place]  GI/Abdomen: BS +, non-tender, non-distended Ext: no clubbing, cyanosis, or edema Psych: pleasant and cooperative  Skin: Clean and intact without signs of breakdown Uro: urine clear in foley bag Neuro:  Alert and oriented x 3. Normal insight and awareness. Intact Memory. Normal language and speech. Cranial nerve exam unremarkable. Motor 4/5 UE. LE 3/5 HF, 4/5 ADF/PF. No focal sensory findings--stable exam Musculoskeletal: Full ROM, No pain with AROM or PROM in the neck, trunk, or extremities. Posture appropriate     Assessment/Plan: 1. Functional deficits which require 3+ hours per day of interdisciplinary  therapy in a comprehensive inpatient rehab setting. Physiatrist is providing close team supervision and 24 hour management of active medical problems listed below. Physiatrist and rehab team continue to assess barriers to discharge/monitor patient progress toward functional and medical goals  Care Tool:  Bathing              Bathing assist Assist Level: Minimal Assistance - Patient > 75%     Upper Body Dressing/Undressing Upper body dressing        Upper body assist Assist Level: Set up assist    Lower Body Dressing/Undressing Lower body dressing            Lower body assist Assist for lower body dressing: Minimal Assistance - Patient > 75%     Toileting Toileting    Toileting assist Assist for toileting: Contact Guard/Touching assist     Transfers Chair/bed transfer  Transfers assist     Chair/bed transfer assist level: Contact Guard/Touching assist     Locomotion Ambulation   Ambulation assist      Assist level: Contact Guard/Touching assist Assistive device: Walker-rolling Max distance: 100   Walk 10 feet activity   Assist     Assist level: Contact Guard/Touching assist Assistive device: Walker-rolling  Walk 50 feet activity   Assist    Assist level: Contact Guard/Touching assist Assistive device: Walker-rolling    Walk 150 feet activity   Assist Walk 150 feet activity did not occur: Safety/medical concerns         Walk 10 feet on uneven surface  activity   Assist Walk 10 feet on uneven surfaces activity did not occur: Safety/medical concerns         Wheelchair     Assist Is the patient using a wheelchair?: No             Wheelchair 50 feet with 2 turns activity    Assist            Wheelchair 150 feet activity     Assist          Blood pressure 112/70, pulse 79, temperature 97.6 F (36.4 C), resp. rate 16, height '5\' 6"'$  (1.676 m), weight 50.9 kg, SpO2 100 %.  Medical Problem List  and Plan: 1. Functional deficits secondary to debility after COVID/respiratory failure             -patient may shower             -ELOS/Goals: 7-14 days, supervision to mod I with PT,OT  -Continue CIR therapies including PT, OT   Discussed team conference on Wednesday.  2.  Impaired mobility, ambulating >300 feet, d/c lovenox 3. Pain Management: Oxycodone prn. Lidocaine patch for local measures.  4. Mood/Behavior/Sleep: LCSW to follow for evaluation and support.              -antipsychotic agents: N/a 5. Neuropsych/cognition: This patient is capable of making decisions on his own behalf. 6. Skin/Wound Care: Routine pressure relief measures             --maintain adequate nutritional status.  7. Fluids/Electrolytes/Nutrition: Monitor I/O. Check CMET on 09/25             -family bringing in food from home 8. IPF: Prednisone to be tapered every 14 days.             --wean oxygen as tolerated to off.              --Esbriet one pill TID X one week -->2 pills TID and hold there.  9. Prediabetes/steroid induced hyperglycemia: Hgb A1C-5.8 and anticipate that BS will improve with taper and increase in activity.              --change Ensure to Ensure Max with meals.  CBG (last 3)  Recent Labs    01/20/22 1650 01/20/22 2107 01/21/22 0608  GLUCAP 130* 230* 75    Decrease levemir to 8U and start metformin.  -discussed dietary choices with pt and son.  Don't want to restrict what he eats too much as he needs to put on weight. Also steroids on slow wean. 10. H/o Prostate cancer/Urinary retention: Continue foley to SD. On flomax.  39. H/o dysphagia/Esophageal Web: Soft food. Pills with puree.              -food from home  -will consult GI for second opinion while he is here as per family's request.  12. Cough: continue Tessalon perles bid.   -d/c robitussin since not helping.              -see #8    LOS: 3 days A FACE TO FACE EVALUATION WAS PERFORMED  Emaree Chiu P Rossie Bretado 01/21/2022, 11:11  AM

## 2022-01-21 NOTE — Progress Notes (Signed)
Inpatient Rehabilitation Care Coordinator Assessment and Plan Patient Details  Name: Jeff Willis MRN: [id number] Date of Birth: 07/18/1940  Today's Date: 01/21/2022  Hospital Problems: Principal Problem:   Debility  Past Medical History:  Past Medical History:  Diagnosis Date   Back pain    Elevated PSA    Essential tremor    Hypercholesteremia    Neuropathy    Prostate cancer (HCC)    Sinus tachycardia 01/30/2018   Past Surgical History:  Past Surgical History:  Procedure Laterality Date   COLONOSCOPY WITH PROPOFOL N/A 08/10/2021   Procedure: COLONOSCOPY WITH PROPOFOL;  Surgeon: Hung, Patrick, MD;  Location: [facility];  Service: Gastroenterology;  Laterality: N/A;   ESOPHAGEAL DILATION  08/10/2021   Procedure: ESOPHAGEAL DILATION;  Surgeon: Hung, Patrick, MD;  Location: [facility];  Service: Gastroenterology;;   ESOPHAGOGASTRODUODENOSCOPY (EGD) WITH PROPOFOL N/A 08/10/2021   Procedure: ESOPHAGOGASTRODUODENOSCOPY (EGD) WITH PROPOFOL;  Surgeon: Hung, Patrick, MD;  Location: [facility];  Service: Gastroenterology;  Laterality: N/A;   HEMOSTASIS CLIP PLACEMENT  08/10/2021   Procedure: HEMOSTASIS CLIP PLACEMENT;  Surgeon: Hung, Patrick, MD;  Location: [facility];  Service: Gastroenterology;;   LUMBAR MICRODISCECTOMY     L4-5   POLYPECTOMY  08/10/2021   Procedure: POLYPECTOMY;  Surgeon: Hung, Patrick, MD;  Location: [facility];  Service: Gastroenterology;;   RADIOACTIVE SEED IMPLANT N/A 02/24/2018   Procedure: RADIOACTIVE SEED IMPLANT/BRACHYTHERAPY IMPLANT;  Surgeon: Eskridge, Matthew, MD;  Location: [facility];  Service: Urology;  Laterality: N/A;   RIGHT HEART CATH N/A 11/22/2021   Procedure: RIGHT HEART CATH;  Surgeon: Kelly, Thomas A, MD;  Location: [facility];  Service: Cardiovascular;  Laterality: N/A;   SPACE OAR INSTILLATION N/A 02/24/2018   Procedure: SPACE OAR INSTILLATION;  Surgeon: Eskridge, Matthew, MD;  Location: [facility];  Service: Urology;  Laterality: N/A;   Social History:  reports that he has never smoked. He has never used smokeless tobacco. He reports that he does not drink alcohol and does not use drugs.  Family / Support Systems Spouse/Significant Other: Kamala Children: [name], Sanjay (Sons), Bhavna (Daughter) Other Supports: Sgar (Nephew) Anticipated Caregiver: [name] and Sanjay (sons) Ability/Limitations of Caregiver: none Caregiver Availability: 24/7 Family Dynamics: support from spouse, children and other family members  Social History Preferred language: English Religion: [employer] Literacy - How often do you need to have someone help you when you read instructions, pamphlets, or other written material from your doctor or pharmacy?: Never Writes: Yes   Abuse/Neglect Abuse/Neglect Assessment Can Be Completed: Yes Physical Abuse: Denies Verbal Abuse: Denies Sexual Abuse: Denies Exploitation of patient/patient's resources: Denies Self-Neglect: Denies  Patient response to: Social Isolation - How often do you feel lonely or isolated from those around you?: Never  Emotional Status Recent Psychosocial Issues: none Psychiatric History: none Substance Abuse History: none  Patient / Family Perceptions, Expectations & Goals Pt/Family understanding of illness & functional limitations: yes Premorbid pt/family roles/activities: independent Anticipated changes in roles/activities/participation: sons and newphew able to assist at discharge Pt/family expectations/goals: MOD I to Supervision  Community Resources Community Agencies: None Premorbid Home Care/DME Agencies: None Transportation available at discharge: family able to transport Is the patient able to respond to transportation needs?: Yes In the past 12 months, has lack of transportation kept you from medical appointments or from getting medications?: No In the past 12 months, has lack of transportation kept you  from meetings, work, or from getting things needed for daily living?: No  Discharge Planning   Living Arrangements: Spouse/significant other, Children Support Systems: Spouse/significant other, Children Type of Residence: Private residence Insurance Resources: Private Insurance (specify) (Blue Medicare) Financial Resources: Family Support Financial Screen Referred: No Living Expenses: Lives with family Money Management: Family, Spouse Does the patient have any problems obtaining your medications?: No Home Management: Independent Patient/Family Preliminary Plans: Family able to assist at discharge Care Coordinator Barriers to Discharge: Lack of/limited family support, Decreased caregiver support Care Coordinator Anticipated Follow Up Needs: HH/OP Expected length of stay: 7-14 Days  Clinical Impression SW met with spouse introduced self and explained role. Patient plans to discharge home with assistance from sons and supervision from spouse. No additional questions or concerns, SW will follow up with spouse on Wednesday.   Christina J [place] 01/21/2022, 12:32 PM    

## 2022-01-21 NOTE — Progress Notes (Signed)
Physical Therapy Session Note  Patient Details  Name: Jeff Willis MRN: [id number] Date of Birth: November 18, 1940  Today's Date: 01/21/2022 PT Individual Time: 1115-1215 PT Individual Time Calculation (min): 60 min   Short Term Goals: Week 1:  PT Short Term Goal 1 (Week 1): STGs = LTGs  Skilled Therapeutic Interventions/Progress Updates:  Patient greeted semi-reclined in bed with spouse present and agreeable to PT treatment session. Patient reporting rib pain secondary to excessive coughing at this time.   Patient transitioned from semi-reclined to sitting EOB with supv and use of bed rail- While sitting EOB, patient donned socks and shoes independently.   Patient perfomred sit/stand with RW from EOB and SBA for safety- Patient then gait trained x150' from his room to rehab gym with RW and SBA/CGA. VC for increased step length and foot clearance with good imrpovements noted. Patient required extended seated rest break secondary to notable SOB. Patient on 1L oxygen via [place], however sats dropped to 82% with exertion of 1L- Patient given time to increase sats, however unable to increase above 88% and required increase from 1 to 2L oxygen via [place] with sats increasing to >93%.   Patient gait trained x100' with RW and SBA- VC for increased BOS and increased step length bilaterally with good improvements noted. Extended seated rest break required secondary to impaired endurance/SOB.   Patient gait trained x100' without AD and CGA for safety- Patient demonstrated decreased B arm swing throughout gait trial, however no LOB noted. Patient required increased time to recover post gait trial ~3+ minutes for sats to be >93%.   Patient stood without UE support and performed alternating toe taps to 6" step with CGA for safety- x20 alternating toe taps.   Patient stood on airex foam without UE support while performing alternating toe taps to 6" step with CGA- 2 x 20 alternating toe taps performed. Seated  rest break in between secondary to impaired endurance/activity tolerance.   Patient gait trained back to his room ~150' with RW and SBA for safety- Increased time required secondary to decreased gait cadence and impaired endurance/activity tolerance. Therapist managing oxygen line and tank.    Patient returned to his room sitting EOB with spouse present and returned to 1L oxygen via [place] at the wall.    Therapy Documentation Precautions:  Precautions Precautions: Fall Precaution Comments: Watch SpO2 and HR Restrictions Weight Bearing Restrictions: No   Therapy/Group: Individual Therapy  Suzi Hernan 01/21/2022, 7:57 AM

## 2022-01-21 NOTE — Progress Notes (Signed)
Met with patient. Family at bedside. Discussed team conference Wednesday, will talk about goals, barriers and discharge.  Discussed diet but realize that patient needs to eat. Discussed daily weights at home and provided record sheet for weight and blood pressure. Patient most concerned with cough. Already had tessalon pearls. Administered robitussin while in room. Doesn't wear O2 at home. Talked about energy conservation and will try to ween O2.  Discussed Bactrim DS and other medications. Informed that will discuss foley but was started on Flomax for bladder.

## 2022-01-22 ENCOUNTER — Inpatient Hospital Stay (HOSPITAL_COMMUNITY): Payer: Medicare Other

## 2022-01-22 ENCOUNTER — Ambulatory Visit: Payer: Medicare Other | Admitting: Thoracic Surgery (Cardiothoracic Vascular Surgery)

## 2022-01-22 DIAGNOSIS — R0982 Postnasal drip: Secondary | ICD-10-CM

## 2022-01-22 DIAGNOSIS — R053 Chronic cough: Secondary | ICD-10-CM

## 2022-01-22 DIAGNOSIS — R339 Retention of urine, unspecified: Secondary | ICD-10-CM | POA: Diagnosis not present

## 2022-01-22 DIAGNOSIS — R5381 Other malaise: Secondary | ICD-10-CM | POA: Diagnosis not present

## 2022-01-22 LAB — GLUCOSE, CAPILLARY
Glucose-Capillary: 78 mg/dL (ref 70–99)
Glucose-Capillary: 144 mg/dL — ABNORMAL HIGH (ref 70–99)
Glucose-Capillary: 211 mg/dL — ABNORMAL HIGH (ref 70–99)

## 2022-01-22 MED ORDER — OXYCODONE HCL 5 MG PO TABS: 5.0000 mg | ORAL_TABLET | Freq: Four times a day (QID) | ORAL | Status: DC | PRN

## 2022-01-22 MED ORDER — FLUTICASONE PROPIONATE 50 MCG/ACT NA SUSP: 1.0000 | Freq: Every day | NASAL | Status: DC | PRN

## 2022-01-22 MED ORDER — HYDROCOD POLI-CHLORPHE POLI ER 10-8 MG/5ML PO SUER
5.0000 mL | Freq: Two times a day (BID) | ORAL | Status: DC | PRN
  Administered 2022-01-22: 5 mL via ORAL
  Filled 2022-01-22 (×2): qty 5

## 2022-01-22 NOTE — Progress Notes (Signed)
Occupational Therapy Session Note  Patient Details  Name: Jeff Willis MRN: [id number] Date of Birth: [birth year]  Today's Date: 01/22/2022 OT Individual Time: 1020-1115 & 1417-1530 OT Individual Time Calculation (min): 55 min  & 73 min   Short Term Goals: Week 1:  OT Short Term Goal 1 (Week 1): STGs=LTGs due to ELOS OT Short Term Goal 2 (Week 1): Patient  Skilled Therapeutic Interventions/Progress Updates:  Session 1 Skilled OT intervention completed with focus on . Pt received seated upright in bed, no c/o pain, however indicated that he had a rough morning and that shower really took it out of him yesterday. Education provided (per clarification from MD) about goal of 90% or greater SPO2 with plans to try and titrate him down, however will only be able to do so as his cardiorespiratory endurance improves. (See below for vitals)  Pt requested to use bathroom, with pt completing bed mobility at mod I level, sit > stand without AD and supervision, CGA ambulatory transfer without AD to [facility] over toilet. Pt was continent of BM, with CGA provided for balance only during posterior pericare. Nurse notified of BM status with foley care needs.  Ambulated at [facility] level to w/c. Extended rest break needed toileting needs, seated in w/c with pt able to complete hand hygiene seated with set up A.  During rest break, therapist provided education on energy conservation strategies related to bathing/dressing including use of AE PRN to minimize lung constriction with forward leans. Demo was provided on use of reacher and sock aid for LB dressing however with figure 4 method, pt was able to simulate threading of BLE into clothing with supervision.  Pt reported too much fatigue for in hall walking, however agreeable to in room ambulation. Ambulated about 40 ft via laps in room with CGA without AD. Slight increase in SOB, however upon seated EOB pt's SPO2 was WNL remaining on 1L. Pt transitioned back to  bed, with mod I, and remained upright in bed, with all needs in reach at end of session.  O2 SPO2 (Received on 1L; remained during session) Upright in bed- 100% EOB- 96% After ambulating <> commode/toileting needs; seated in w/c- 86%, able to rebound with extended rest break to 92% After 21f ambulation in room; in stance- 92%  Session 2 Skilled OT intervention completed with focus on tub/shower transfers, ambulatory and cardiorespiratory endurance to promote independence with BADLs. Pt received upright in bed, no c/o pain, however indicated fatigue from previous sessions. Agreeable to try tub shower transfer while family was present. See below for oxygen fluctuations during session.  Emptied 40 cc of urine from catheter bag prior to ambulation. Then ambulated from EOB to w/c with supervision and no AD. Transported from room > ADL bathroom for energy conservation.  Education provided on DME available such as shower chair that can be purchased for use at home, suggested use of [facility] ease of bathing, shower curtain management to prevent water spillage, energy conservation strategies, use of grab bars as well as effective safety strategies for exiting the shower to eliminate falls. Pt was able to return demonstrate step over the threshold method with use of grab bars and CGA. Able to stand from shower chair and exit with supervision. Assist mainly needed to manage O2 and catheter cords. Family indicates they might have shower chair access in storage but will confirm.  Ambulated 910fusing RW and supervision, continuously in straight path with w/c follow for fatigue. See below for vitals. Extended  rest breaks needed, frequent reminders of pursed lip breathing utilized. Ambulated 61f back to room, at same assist.  Doffed pants with supervision, assist only for catheter management. Transitioned into bed with mod I.  Pt agreeable to complete exercises in which he completed the following: (With  yellow theraband) x10 reps Horizontal abduction Self-anchored bicep flexion each arm  Pt remained upright in bed with bed alarm on and all needs in reach at end of session.  O2 SPO2 (Received on RA; remained on RA for half of session, then required increased titration) Upright in bed- 97% After in room ambulation to w/c- 90% After tub/shower transfer- 88%, unable to rebound with increased time to >90% therefore added 0.5 L in prep for hallway ambulation After ambulating 94' on 0.5 L - 75%, titrated up to 2L as pt unable to rebound to 90% with just 1 L however with extended break was able to rebound to 92% After ambulating 80'- 83% on 2L Able to titrate down to 1 L at conclusion of session with extended time and pursed lip breathing at 90%   Therapy Documentation Precautions:  Precautions Precautions: Fall Precaution Comments: Watch SpO2 and HR Restrictions Weight Bearing Restrictions: No    Therapy/Group: Individual Therapy  HBlase Mess MS, OTR/L  01/22/2022, 3:49 PM

## 2022-01-22 NOTE — Progress Notes (Signed)
Pt foley removed per MD order. Pt tolerated well. No pain or complaints of pain. Voiding trial initiated. All need at this time. Bed in lowest position, call light in reach.

## 2022-01-22 NOTE — Progress Notes (Signed)
Physical Therapy Session Note  Patient Details  Name: Jeff Willis MRN: [id number] Date of Birth: July 04, 1940  Today's Date: 01/22/2022 PT Individual Time: 0731-0842 PT Individual Time Calculation (min): 71 min   Short Term Goals: Week 1:  PT Short Term Goal 1 (Week 1): STGs = LTGs  Skilled Therapeutic Interventions/Progress Updates:   Received pt sitting in [employer] with daughter present at bedside on 1L O2 via [place] with SPO2 85-88% - increased to 2L for remainder of session. Pt agreeable to PT treatment and denied any pain during session. Session with emphasis on functional mobility/transfers, generalized strengthening and endurance, dyanmic standing balance/coordination, and gait training. Donned socks and shoes with set up assist and requested to use RW during session - performed all transfers with RW and supervision throughout session. Pt ambulated 148f with RW and close supervision to dayroom - SPO2 dropped to 87% and pt required extensive rest break for SPO2 to reach 94%. Pt then ambulated 152fx 2 trials with RW and supervision to/from [place] and required extended rest break prior to starting. Pt reported "today is not my day" and requested to begin therapy after 9am - notified scheduling.   Pt performed BUE/LE strengthening on Nustep for 1 minute and 50 seconds x 1, 1 minute and 10 seconds x 1, and 1 minute and 44 seconds x 1 - stopped due to SPO2 dropping as low as 82% and HR reaching 118bpm but quickly recovered to 100% and HR decreased to 102bpm with rest. Pt concerned pulse was too high but explained that it is normal for HR to jump during exercise and before taking morning medications - pt then requesting medication for HR - RN notified and present to administer medication. Pt reported feeling weak and requesting to work on seated exercises - performed LAQ with 1lb ankle weight 2x15 bilaterally with emphasis on LE strength with significantly increased time. Pt reported feeling too weak to  ambulate back to room - therefore retrieved WC and pt transported back to room dependently. Pt requested to return to bed and transferred sit<>supine with supervision. Concluded session with pt semi-reclined in bed with all needs within reach and daughter present at bedside. Pt left on 1L O2 with SPO2 93% and HR 98bpm.  Of note - pt constantly clearing throat throughout session and hyperfocused on HR being high (HR increased during clearing throat episodes but improved with pursed lip breathing). Pt also required multiple significantly extended rest breaks throughout session due to SOB.  Therapy Documentation Precautions:  Precautions Precautions: Fall Precaution Comments: Watch SpO2 and HR Restrictions Weight Bearing Restrictions: No  Therapy/Group: Individual Therapy AnAlfonse AlpersT, DPT  01/22/2022, 6:55 AM

## 2022-01-22 NOTE — Progress Notes (Signed)
PROGRESS NOTE   Subjective/Complaints: Coughing more this morning- Tussinex and Flonase ordered prn as appears to have drainage.  Discussed increase in metformin and decrease in insulin  ROS: Patient denies fever, rash, sore throat, blurred vision, dizziness, nausea, vomiting, diarrhea,  chest pain, joint or back/neck pain, headache, or mood change. +difficulty swallowing, continued cough   Objective:   DG CHEST PORT 1 VIEW  Result Date: 01/22/2022 CLINICAL DATA:  Chest radiograph 01/10/2022 EXAM: PORTABLE CHEST 1 VIEW COMPARISON:  Cough FINDINGS: No pleural effusion. No pneumothorax. No focal airspace opacity. Bibasilar hazy interstitial opacities appears slightly less conspicuous than on prior imaging. Unchanged cardiac and mediastinal contours. There is a subdiaphragmatic lucency on the left, which is favored to represent air within a gas distended stomach. No acute osseous abnormality. IMPRESSION: 1. Slight interval improvement in hazy bilateral pulmonary opacities 2. Left-sided subdiaphragmatic lucency is favored to representair within a gas distended stomach. If there is clinical concern for abdominal pathology, a dedicated abdominal radiograph is recommended for further evaluation. Electronically Signed   By: Marin Roberts M.D.   On: 01/22/2022 10:19   Recent Labs    01/21/22 0901  WBC 8.6  HGB 12.8*  HCT 37.4*  PLT 197   Recent Labs    01/21/22 0901  NA 138  K 3.4*  CL 102  CO2 30  GLUCOSE 155*  BUN 15  CREATININE 1.04  CALCIUM 9.2    Intake/Output Summary (Last 24 hours) at 01/22/2022 1113 Last data filed at 01/22/2022 0554 Gross per 24 hour  Intake 240 ml  Output 2175 ml  Net -1935 ml        Physical Exam: Vital Signs Blood pressure 116/73, pulse 88, temperature 97.9 F (36.6 C), temperature source Oral, resp. rate 16, height '5\' 6"'$  (1.676 m), weight 50.9 kg, SpO2 100 %.  Constitutional: No distress .  Vital signs reviewed. BMI 18.11 HEENT: NCAT, EOMI, oral membranes moist Neck: supple Cardiovascular: RRR without murmur. No JVD    Respiratory/Chest: CTA Bilaterally without wheezes or rales. Normal effort. Less coughing today with deep inspiration. 1L O2 , desatted to 79 percent when ambulating with therapy GI/Abdomen: BS +, non-tender, non-distended Ext: no clubbing, cyanosis, or edema Psych: pleasant and cooperative  Skin: Clean and intact without signs of breakdown Uro: urine clear in foley bag Neuro:  Alert and oriented x 3. Normal insight and awareness. Intact Memory. Normal language and speech. Cranial nerve exam unremarkable. Motor 4/5 UE. LE 3/5 HF, 4/5 ADF/PF. No focal sensory findings--stable exam Musculoskeletal: Full ROM, No pain with AROM or PROM in the neck, trunk, or extremities. Posture appropriate     Assessment/Plan: 1. Functional deficits which require 3+ hours per day of interdisciplinary therapy in a comprehensive inpatient rehab setting. Physiatrist is providing close team supervision and 24 hour management of active medical problems listed below. Physiatrist and rehab team continue to assess barriers to discharge/monitor patient progress toward functional and medical goals  Care Tool:  Bathing    Body parts bathed by patient: Right arm, Left arm, Chest, Abdomen, Front perineal area, Right upper leg, Left upper leg, Face   Body parts bathed by helper: Right lower leg, Left  lower leg     Bathing assist Assist Level: Minimal Assistance - Patient > 75%     Upper Body Dressing/Undressing Upper body dressing        Upper body assist Assist Level: Set up assist    Lower Body Dressing/Undressing Lower body dressing      What is the patient wearing?: Pants     Lower body assist Assist for lower body dressing: Minimal Assistance - Patient > 75%     Toileting Toileting    Toileting assist Assist for toileting: Contact Guard/Touching assist      Transfers Chair/bed transfer  Transfers assist     Chair/bed transfer assist level: Supervision/Verbal cueing     Locomotion Ambulation   Ambulation assist      Assist level: Supervision/Verbal cueing Assistive device: Walker-rolling Max distance: 166f   Walk 10 feet activity   Assist     Assist level: Supervision/Verbal cueing Assistive device: Walker-rolling   Walk 50 feet activity   Assist    Assist level: Supervision/Verbal cueing Assistive device: Walker-rolling    Walk 150 feet activity   Assist Walk 150 feet activity did not occur: Safety/medical concerns         Walk 10 feet on uneven surface  activity   Assist Walk 10 feet on uneven surfaces activity did not occur: Safety/medical concerns         Wheelchair     Assist Is the patient using a wheelchair?: No             Wheelchair 50 feet with 2 turns activity    Assist            Wheelchair 150 feet activity     Assist          Blood pressure 116/73, pulse 88, temperature 97.9 F (36.6 C), temperature source Oral, resp. rate 16, height '5\' 6"'$  (1.676 m), weight 50.9 kg, SpO2 100 %.  Medical Problem List and Plan: 1. Functional deficits secondary to debility after COVID/respiratory failure             -patient may shower             -ELOS/Goals: 7-14 days, supervision to mod I with PT,OT  -Continue CIR therapies including PT, OT   Discussed team conference on Wednesday.  2.  Impaired mobility, ambulating >300 feet, d/c lovenox 3. Pain: decrease Oxycodone to q6H prn. Lidocaine patch for local measures.  4. Mood/Behavior/Sleep: LCSW to follow for evaluation and support.              -antipsychotic agents: N/a 5. Neuropsych/cognition: This patient is capable of making decisions on his own behalf. 6. Skin/Wound Care: Routine pressure relief measures             --maintain adequate nutritional status.  7. Fluids/Electrolytes/Nutrition: Monitor I/O. Check  CMET on 09/25             -family bringing in food from home 8. IPF: Prednisone to be tapered every 14 days.             --wean oxygen as tolerated to off.              --Esbriet one pill TID X one week -->2 pills TID and hold there.  9. Prediabetes/steroid induced hyperglycemia: Hgb A1C-5.8 and anticipate that BS will improve with taper and increase in activity.              --change Ensure to Ensure Max with meals.  CBG (last 3)  Recent Labs    01/21/22 1637 01/21/22 2152 01/22/22 0556  GLUCAP 222* 208* 78    Decrease levemir to 8U and start metformin.  -discussed dietary choices with pt and son.  Don't want to restrict what he eats too much as he needs to put on weight. Also steroids on slow wean. 10. H/o Prostate cancer/Urinary retention: d/c foley. On flomax.  46. H/o dysphagia/Esophageal Web: Soft food. Pills with puree.              -food from home  -will consult GI for second opinion while he is here as per family's request.  12. Cough: continue Tessalon perles bid.   -d/c robitussin since not helping.              -add Tussinex prn.  13. Postnasal drip: Flonase ordered prn    LOS: 4 days A FACE TO FACE EVALUATION WAS PERFORMED  Sierra Spargo P Morgaine Kimball 01/22/2022, 11:13 AM

## 2022-01-23 LAB — GLUCOSE, CAPILLARY
Glucose-Capillary: 163 mg/dL — ABNORMAL HIGH (ref 70–99)
Glucose-Capillary: 88 mg/dL (ref 70–99)
Glucose-Capillary: 128 mg/dL — ABNORMAL HIGH (ref 70–99)
Glucose-Capillary: 199 mg/dL — ABNORMAL HIGH (ref 70–99)
Glucose-Capillary: 193 mg/dL — ABNORMAL HIGH (ref 70–99)

## 2022-01-23 MED ORDER — TAMSULOSIN HCL 0.4 MG PO CAPS
0.8000 mg | ORAL_CAPSULE | Freq: Every day | ORAL | Status: DC
  Administered 2022-01-23 – 2022-01-27 (×5): 0.8 mg via ORAL
  Filled 2022-01-23 (×5): qty 2

## 2022-01-23 MED ORDER — INSULIN DETEMIR 100 UNIT/ML ~~LOC~~ SOLN
6.0000 [IU] | Freq: Every day | SUBCUTANEOUS | Status: DC
  Administered 2022-01-24: 6 [IU] via SUBCUTANEOUS
  Filled 2022-01-23 (×2): qty 0.06

## 2022-01-23 MED ORDER — HYDROCOD POLI-CHLORPHE POLI ER 10-8 MG/5ML PO SUER
5.0000 mL | Freq: Two times a day (BID) | ORAL | Status: DC
  Administered 2022-01-23 – 2022-01-24 (×2): 5 mL via ORAL
  Filled 2022-01-23 (×2): qty 5

## 2022-01-23 NOTE — Progress Notes (Signed)
Pt did not void after removing the foley. Pt bladder scanned at 2300 with volume of 0 and patient bladder scanned at 0609 with volume of 398. Pt tried to void but was unable and did not want to be cath. Patient and son at bedside educated and stated they wanted to wait until after therapy to see if movement would help. Patient reported that his bladder did not feel full. No further concerns at this time. Call bell in reach

## 2022-01-23 NOTE — Discharge Instructions (Addendum)
Inpatient Rehab Discharge Instructions  ZYEIR DYMEK Discharge date and time:  01/25/22  Activities/Precautions/ Functional Status: Activity: no lifting, driving, or strenuous exercise till cleared by MD Diet:  soft foods. Pills crushed or with puree.  Wound Care: none needed   Functional status:  ___ No restrictions     ___ Walk up steps independently ___ 24/7 supervision/assistance   ___ Walk up steps with assistance ___ Intermittent supervision/assistance  ___ Bathe/dress independently ___ Walk with walker     ___ Bathe/dress with assistance ___ Walk Independently    ___ Shower independently ___ Walk with assistance    ___ Shower with assistance _X__ No alcohol     ___ Return to work/school ________  Special Instructions:    My questions have been answered and I understand these instructions. I will adhere to these goals and the provided educational materials after my discharge from the hospital.  Patient/Caregiver Signature _______________________________ Date __________  Clinician Signature _______________________________________ Date __________  Please bring this form and your medication list with you to all your follow-up doctor's appointments.

## 2022-01-23 NOTE — Progress Notes (Signed)
Physical Therapy Session Note  Patient Details  Name: Jeff Willis MRN: [id number] Date of Birth: 1941/03/27  Today's Date: 01/23/2022 PT Individual Time: 0814-4818 and 1301-1356 PT Individual Time Calculation (min): 47 min and 55 min  Short Term Goals: Week 1:  PT Short Term Goal 1 (Week 1): STGs = LTGs  Skilled Therapeutic Interventions/Progress Updates:   Treatment Session 1 Received pt supine in bed with daughter present at bedside. Daughter requesting to speak with CSW and CSW notified. Pt agreeable to PT treatment, and denied any pain during session. Pt on 1L O2 via [place] with SPO2 98% therefore decreased to 0.5L for remainder of session. Session with emphasis on functional mobility/transfers, generalized strengthening and endurance, and dynamic standing balance/coordination, and gait training. Pt transferred supine<>sitting EOB with supervision and performed all transfers with rollator and supervision throughout session. Pt required mod cues for rollator safety and brake management and would benefit from continued education prior to discharge.   Pt ambulated 131f x 2 trials with rollator and supervision to/from dayroom - SPO2 89% increasing to 96% with seated rest break. Raised height of rollator and pt ambulated additional 1852fx 3 trials with rollator and supervision with total A to manage O2 tank - SPO2 dropped to 88% on 1L but pt able to recover to >93% with rest break. On trial 2/3 ambulating on RA, SPO2 dropped to 81% but pt able to recover to 93-94% with extended rest break. Returned to room and concluded session with pt sitting EOB with all needs within reach and daughter present at bedside. Pt left on 0.5L O2 via [place] with SPO2 92%.   Treatment Session 2 Received pt semi-reclined in bed with daughter present at bedside. Pt agreeable to PT treatment and denied any pain during session. Pt on 0.5L O2 via [place] with SPO2 100% - decreased to RA and SPO2 94%. Session with emphasis on  functional mobility/transfers, generalized strengthening and endurance, and dynamic standing balance/coordination, and gait training. Pt transferred semi-reclined<>sitting EOB with HOB elevated and supervision and donned socks/shoes with set up assist.   Pt stood with rollator and supervision and ambulated >15088fith rollator and supervision to rehab apartment - required cues for brake management. Pt performed furniture transfer on/off low sitting couch without AD and supervision and worked on bed mobility from flat bed without rails - pt able to perform with supervision. MD arrived for rounds then pt ambulated additional >150f30fth rollator and supervision to dayroom - SPO2 dropped to 85% on RA and pt unable to recover therefore increased to 0.5L and O2 increased to 92%. Pt performed the following exercises with BUE support on rollator and supervision with emphasis on LE strength/endurance: -squats 2x10 -alternating high knee marching 2x15 bilaterally -hip abduction x12 bilaterally  Pt required significantly extended rest breaks in between exercises due to SOB with SPO2 dropping to 85% on 0.5L. Pt ambulated additional 125ft62fh rollator and supervision back to room and requested to return to bed. Concluded session with pt sitting EOB with all needs within reach and daughter present at bedside. Pt left on 0.5L O2 with SPO2 92%.  Therapy Documentation Precautions:  Precautions Precautions: Fall Precaution Comments: Watch SpO2 and HR - per MD on 9/26 maintain SPO2 at 90% or greater Restrictions Weight Bearing Restrictions: No  Therapy/Group: Individual Therapy Kristin Barcus Alfonse AlpersDPT  01/23/2022, 7:01 AM

## 2022-01-23 NOTE — Progress Notes (Signed)
PROGRESS NOTE   Subjective/Complaints: Tussionex works best for pt , ordered prn but has only taken once , Robitussin DM not as helpful  Reviewed CXR and labwork with pt Pt amb from room to ADL apt with PT on RA with only small drop in O2 sat to 86%  ROS: Patient denies CP, SOB, N/V/D , non prod cough   Objective:   DG CHEST PORT 1 VIEW  Result Date: 01/22/2022 CLINICAL DATA:  Chest radiograph 01/10/2022 EXAM: PORTABLE CHEST 1 VIEW COMPARISON:  Cough FINDINGS: No pleural effusion. No pneumothorax. No focal airspace opacity. Bibasilar hazy interstitial opacities appears slightly less conspicuous than on prior imaging. Unchanged cardiac and mediastinal contours. There is a subdiaphragmatic lucency on the left, which is favored to represent air within a gas distended stomach. No acute osseous abnormality. IMPRESSION: 1. Slight interval improvement in hazy bilateral pulmonary opacities 2. Left-sided subdiaphragmatic lucency is favored to representair within a gas distended stomach. If there is clinical concern for abdominal pathology, a dedicated abdominal radiograph is recommended for further evaluation. Electronically Signed   By: Marin Roberts M.D.   On: 01/22/2022 10:19   Recent Labs    01/21/22 0901  WBC 8.6  HGB 12.8*  HCT 37.4*  PLT 197    Recent Labs    01/21/22 0901  NA 138  K 3.4*  CL 102  CO2 30  GLUCOSE 155*  BUN 15  CREATININE 1.04  CALCIUM 9.2     Intake/Output Summary (Last 24 hours) at 01/23/2022 1010 Last data filed at 01/22/2022 2005 Gross per 24 hour  Intake 120 ml  Output 425 ml  Net -305 ml         Physical Exam: Vital Signs Blood pressure 125/78, pulse 95, temperature 98.9 F (37.2 C), temperature source Oral, resp. rate 16, height '5\' 6"'$  (1.676 m), weight 50.9 kg, SpO2 96 %.   General: No acute distress Mood and affect are appropriate Heart: Regular rate and rhythm no rubs murmurs or  extra sounds Lungs: Clear to auscultation, breathing unlabored, no rales or wheezes Abdomen: Positive bowel sounds, soft nontender to palpation, nondistended Extremities: No clubbing, cyanosis, or edema Skin: No evidence of breakdown, no evidence of rash Uro: urine clear in foley bag Neuro:  Alert and oriented x 3. Normal insight and awareness. Intact Memory. Normal language and speech. Cranial nerve exam unremarkable. Motor 4/5 UE. LE 3/5 HF, 4/5 ADF/PF. No focal sensory findings--stable exam Musculoskeletal: Full ROM, No pain with AROM or PROM in the neck, trunk, or extremities. Posture appropriate     Assessment/Plan: 1. Functional deficits which require 3+ hours per day of interdisciplinary therapy in a comprehensive inpatient rehab setting. Physiatrist is providing close team supervision and 24 hour management of active medical problems listed below. Physiatrist and rehab team continue to assess barriers to discharge/monitor patient progress toward functional and medical goals  Care Tool:  Bathing    Body parts bathed by patient: Right arm, Left arm, Chest, Abdomen, Front perineal area, Right upper leg, Left upper leg, Face   Body parts bathed by helper: Right lower leg, Left lower leg     Bathing assist Assist Level: Minimal Assistance -  Patient > 75%     Upper Body Dressing/Undressing Upper body dressing        Upper body assist Assist Level: Set up assist    Lower Body Dressing/Undressing Lower body dressing      What is the patient wearing?: Pants     Lower body assist Assist for lower body dressing: Minimal Assistance - Patient > 75%     Toileting Toileting    Toileting assist Assist for toileting: Contact Guard/Touching assist     Transfers Chair/bed transfer  Transfers assist     Chair/bed transfer assist level: Supervision/Verbal cueing     Locomotion Ambulation   Ambulation assist      Assist level: Supervision/Verbal cueing Assistive  device: Walker-rolling Max distance: 168f   Walk 10 feet activity   Assist     Assist level: Supervision/Verbal cueing Assistive device: Walker-rolling   Walk 50 feet activity   Assist    Assist level: Supervision/Verbal cueing Assistive device: Walker-rolling    Walk 150 feet activity   Assist Walk 150 feet activity did not occur: Safety/medical concerns         Walk 10 feet on uneven surface  activity   Assist Walk 10 feet on uneven surfaces activity did not occur: Safety/medical concerns         Wheelchair     Assist Is the patient using a wheelchair?: No             Wheelchair 50 feet with 2 turns activity    Assist            Wheelchair 150 feet activity     Assist          Blood pressure 125/78, pulse 95, temperature 98.9 F (37.2 C), temperature source Oral, resp. rate 16, height '5\' 6"'$  (1.676 m), weight 50.9 kg, SpO2 96 %.  Medical Problem List and Plan: 1. Functional deficits secondary to debility after COVID/respiratory failure poor endurance , hypoxia with amb but recovers with rest             -patient may shower             -ELOS/Goals: 7-14 days, supervision to mod I with PT,OT  -Continue CIR therapies including PT, OT   Discussed team conference on Wednesday.  2.  Impaired mobility, ambulating >300 feet, d/c lovenox 3. Pain: decrease Oxycodone to q6H prn. Lidocaine patch for local measures.  4. Mood/Behavior/Sleep: LCSW to follow for evaluation and support.              -antipsychotic agents: N/a 5. Neuropsych/cognition: This patient is capable of making decisions on his own behalf. 6. Skin/Wound Care: Routine pressure relief measures             --maintain adequate nutritional status.  7. Fluids/Electrolytes/Nutrition: Monitor I/O. Check CMET on 09/25             -family bringing in food from home 8. IPF: Prednisone to be tapered every 14 days.             --wean oxygen as tolerated to off.               --Esbriet one pill TID X one week -->2 pills TID and hold there.  9. Prediabetes/steroid induced hyperglycemia: Hgb A1C-5.8 and anticipate that BS will improve with taper and increase in activity.              --change Ensure to Ensure Max with meals.  CBG (last  3)  Recent Labs    01/22/22 1640 01/22/22 2111 01/23/22 0624  GLUCAP 211* 163* 88     Decrease levemir to 8U and start metformin.    Don't want to restrict what he eats too much as he needs to put on weight.  10. H/o Prostate cancer/Urinary retention: d/c foley. On flomax. I/O cath for 711m 11. H/o dysphagia/Esophageal Web: Soft food. Pills with puree.              -food from home  -will consult GI for second opinion while he is here as per family's request.  12. Cough: schedule tussionex while in hospital , would try another agent at D/C since it does contain habit forming Hydrocodone  13. Postnasal drip: Flonase ordered prn    LOS: 5 days A FACE TO FACE EVALUATION WAS PERFORMED  ACharlett Blake9/27/2023, 10:10 AM

## 2022-01-23 NOTE — Progress Notes (Signed)
Pt and family would like to discuss with Dr or PA what next steps are for urology. Pt hasn't been able to void since having foley removed. In & out cathed twice today on my shift. Blood tinged urine the second time.   [facility] Allegra Grana

## 2022-01-23 NOTE — Progress Notes (Signed)
Occupational Therapy Session Note  Patient Details  Name: CEDRIC MCCLAINE MRN: [id number] Date of Birth: 1940/12/01  Today's Date: 01/23/2022 OT Individual Time: 1454-1540 OT Individual Time Calculation (min): 46 min    Short Term Goals: Week 1:  OT Short Term Goal 1 (Week 1): STGs=LTGs due to ELOS OT Short Term Goal 2 (Week 1): Patient  Skilled Therapeutic Interventions/Progress Updates:  Pt asleep in bed with DIL present in room upon OT arrival to the room. Pt reports, "I did a lot today." Pt in agreement for OT session.  Therapy Documentation Precautions:  Precautions Precautions: Fall Precaution Comments: Watch SpO2 and HR - per MD on 9/26 maintain SPO2 at 90% or greater Restrictions Weight Bearing Restrictions: No Vital Signs: Please see "Flowsheet" for most recent vitals charted by nursing staff.  Pain: Pain Assessment Pain Scale: 0-10 Pain Score: 0-No pain  ADL: Pt able to don/doff socks and shoes with set-up assist while at EOB.  Functional Mobility: Pt participates in functional ambulation in order to improve cardiopulmonary endurance needed for improved safety for DC. Pt able to perform functional ambulation from room <> therapy gym with close supervision, use of rollator, and assistance to manage O2 tank. Pt's SPO2 checked when arriving to therapy gym on 1/2L and decreased to 89%, however, able to increase with to 90+% with ~1 min with pursed lip breathing. Pt able to perform therapeutic exercise with 1/2L with SPO2 remaining >90%. Pt stayed on 1/2L to ambulate from therapy gym > room with close supervision with use of rollator and SPO2 checked with SPO2 decreasing to ~80%. Pt attempted pursed-lip breathing for ~1 min with increased difficulty improving SPO2 50 >90%. OT increased O2 to 1L with pt's SPO2 improving to >90%. Pt able to perform supine <> sit transfer with supervision.   Therapeutic Exercise: Pt participates in therapeutic exercise to improve  cardiopulmonary endurance. Pt able to perform 15 reps of the following exercises using a 3# dowel rod while seated at edge of mat: forward/reverse rows, chest press, and bicep curls with rest breaks required in between each exercise. Pt's SPO2 closely monitored throughout therapeutic exercise and remained >90%. However, pt requires minimal VC's for proper pursed lip breathing techniques.   Pt returned to bed at end of session. Pt left resting comfortably in bed with personal belongings and call light within reach, bed alarm on and activated, bed in low position, 3 bed rails up, family member present in room, and comfort needs attended to.   Therapy/Group: Individual Therapy  Barbee Shropshire 01/23/2022, 4:07 PM

## 2022-01-23 NOTE — Progress Notes (Signed)
[name], male   DOB: 09/03/1940, 81 y.o.   MRN: [id number]  [place] and Shower Chair ordered through [facility]

## 2022-01-23 NOTE — Progress Notes (Addendum)
[name], male   DOB: Sep 02, 1940, 81 y.o.   MRN: [id number]  Patient [name] orders sent to [facility] Patient approved

## 2022-01-23 NOTE — Progress Notes (Signed)
[name], male   DOB: 10/19/1940, 81 y.o.   MRN: [id number]  Team Conference Report to Patient/Family  Team Conference discussion was reviewed with the patient and caregiver, including goals, any changes in plan of care and target discharge date.  Patient and caregiver express understanding and are in agreement.  The patient has a target discharge date of  .  Sw met with patient, DIL and son via telephone. Sw provided team conference updates. Son has medical questions in reference to patient's chronic cough. Family requesting HH vs. OP. Family requesting patient trial daily activity in the ADL apartment. No additional questions concerns.   Dyanne Iha 01/23/2022, 2:00 PM

## 2022-01-23 NOTE — Patient Care Conference (Signed)
Inpatient RehabilitationTeam Conference and Plan of Care Update Date: 01/23/2022   Time: 11:34 AM    Patient Name: Jeff Willis      Medical Record Number: [id number]  Date of Birth: 08-Mar-1941 Sex: Male         Room/Bed: 4W20C/4W20C-01 Payor Info: Payor: [place] / Plan: [employer] MEDICARE / Product Type: *No Product type* /    Admit Date/Time:  01/18/2022  2:23 PM  Primary Diagnosis:  [facility] Problems: Principal Problem:   Debility    Expected Discharge Date: Expected Discharge Date: 01/28/22  Team Members Present: Physician leading conference: Dr. Alysia Penna Social Worker Present: Erlene Quan, [place] Nurse Present: Other (comment) Tacy Learn, RN) PT Present: Becky Sax, PT OT Present: Jennefer Bravo, OT PPS Coordinator present : Gunnar Fusi, SLP     Current Status/Progress Goal Weekly Team Focus  Bowel/Bladder   Pt continent of B/B. LBM 9/26  remain contient of b/b  Assist with toileting qshift and prn   Swallow/Nutrition/ Hydration             ADL's   min A bathing, min A LB dressing, CGA toileting  Mod I, supervision shower transfers/bathing/IADLs  cardio-resp endurance, ADL retraining, AE education, general strengthening   Mobility   bed mobility supervision, transfers with RW CGA, gait 147f with RW and CGA  Mod I, supervision stairs  functional mobility/transfers, generalized strengthening and endurance, dyanmic standing balance/coordination, gait training, stair navigation, D/C planning, family education training.   Communication             Safety/Cognition/ Behavioral Observations            Pain   no c/o pain  remain pain free  Assess pain qshift and prn   Skin   no skin issues  prevent skin issues  Assess skin qshift     Discharge Planning:  Discharging home with physical assistance from sons and supervision from spouse   Team Discussion: Debility s/p Covid. Patient is alert and oriented.  Continent of bowel. LBM 09/26. Foley was removed 09/26, I/O cath d/t no void. Flomax. Denies pain. Skin is intact. Patient has persistent cough being treated with PRNs. Insulin adjusted. Tolerating RA with energy conservation.  Patient on target to meet rehab goals: yes, ambulating 180 feet with device  *See Care Plan and progress notes for long and short-term goals.   Revisions to Treatment Plan:  Monitor labs, medication adjustments  Teaching Needs: Medications, safety, gait/transfer training, energy conservation, etc.  Current Barriers to Discharge: Decreased caregiver support, Weight, and bladder care  Possible Resolutions to Barriers: Family education, medication education, bladder education, order recommended DME     Medical Summary Current Status: still has cough non productive , DM controlled, pulmonary fibrosis, dysphagia  Barriers to Discharge: Medical stability   Possible Resolutions to Barriers/Weekly Focus: cont modified diet, DM management   Continued Need for Acute Rehabilitation Level of Care: The patient requires daily medical management by a physician with specialized training in physical medicine and rehabilitation for the following reasons: Direction of a multidisciplinary physical rehabilitation program to maximize functional independence : Yes Medical management of patient stability for increased activity during participation in an intensive rehabilitation regime.: Yes Analysis of laboratory values and/or radiology reports with any subsequent need for medication adjustment and/or medical intervention. : Yes   I attest that I was present, lead the team conference, and concur with the assessment and plan of the team.   KMaebelle Munroe  [place] 01/23/2022, 2:26 PM

## 2022-01-23 NOTE — Progress Notes (Signed)
Occupational Therapy Session Note  Patient Details  Name: Jeff Willis MRN: [id number] Date of Birth: March 29, 1941  Today's Date: 01/23/2022 OT Individual Time: 0920-1000 OT Individual Time Calculation (min): 40 min    Short Term Goals: Week 1:  OT Short Term Goal 1 (Week 1): STGs=LTGs due to ELOS OT Short Term Goal 2 (Week 1): Patient  Skilled Therapeutic Interventions/Progress Updates:  Skilled OT intervention completed with focus on toileting, family education with pt's youngest son present regarding oxygen fluctuations and functional needs, as well as intro to rollator in prep for home management. Pt received upright in bed, no c/o pain, indicated he has been unable to void since foley was removed.  (See below for vitals)  Agreeable to try and void on commode for more functional voiding environment to promote success with active void. Sit > stand with supervision, no AD, then ambulatory transfer to [facility] over toilet with CGA. Despite time, water running for auditory stimulant, pt was unable to void or have BM. Completed supervision pants management and CGA ambulation to w/c.  Pt's youngest son had various questions regarding toileting, mobility, and DME, with therapist educating both pt and son about recommendations of pt ambulating to the bathroom for promoting physical activity during the day, using shower chair for showers for energy conservation, and potential use of rollator for mobility to promote pt confidence with in home and community mobility/balance.  Retrieved rollator, introduced pt to the features, and discussed safety regulations with breaks, and energy conservation strategies. Pt was able to ambulate in hall, about 30 ft, with 1 turn with supervision. No LOB occurred, with pt indicating that he felt comfortable with the device. Primary PT aware of upgrading to rollator.  Pt returned to bed with supervision, and remained upright with bed alarm on and all needs in reach  at end of session.  SPO2  Received on 0.5 L via [place], 99% Ambulation to bathroom on RA, 86% Ambulation to w/c, 80%, added 0.5 supplemental with rebound to 90% with rest also Ambulation in hallway and back, 90% on 0.5 L with pt remaining at end of session     Therapy Documentation Precautions:  Precautions Precautions: Fall Precaution Comments: Watch SpO2 and HR - per MD on 9/26 maintain SPO2 at 90% or greater Restrictions Weight Bearing Restrictions: No    Therapy/Group: Individual Therapy  Blase Mess, MS, OTR/L  01/23/2022, 12:03 PM

## 2022-01-24 ENCOUNTER — Telehealth: Payer: Self-pay | Admitting: *Deleted

## 2022-01-24 ENCOUNTER — Other Ambulatory Visit: Payer: Self-pay | Admitting: Physical Medicine and Rehabilitation

## 2022-01-24 LAB — GLUCOSE, CAPILLARY
Glucose-Capillary: 101 mg/dL — ABNORMAL HIGH (ref 70–99)
Glucose-Capillary: 144 mg/dL — ABNORMAL HIGH (ref 70–99)
Glucose-Capillary: 135 mg/dL — ABNORMAL HIGH (ref 70–99)
Glucose-Capillary: 141 mg/dL — ABNORMAL HIGH (ref 70–99)

## 2022-01-24 MED ORDER — HYDROCOD POLI-CHLORPHE POLI ER 10-8 MG/5ML PO SUER
5.0000 mL | Freq: Two times a day (BID) | ORAL | Status: DC | PRN
  Administered 2022-01-24 – 2022-01-27 (×4): 5 mL via ORAL
  Filled 2022-01-24 (×4): qty 5

## 2022-01-24 MED ORDER — FREESTYLE LITE TEST VI STRP: ORAL_STRIP | 5 refills | Status: DC

## 2022-01-24 MED ORDER — FREESTYLE LANCETS MISC: 12 refills | Status: DC

## 2022-01-24 MED ORDER — METFORMIN HCL 850 MG PO TABS
850.0000 mg | ORAL_TABLET | Freq: Two times a day (BID) | ORAL | Status: DC
  Administered 2022-01-24 – 2022-01-28 (×8): 850 mg via ORAL
  Filled 2022-01-24 (×8): qty 1

## 2022-01-24 MED ORDER — FREESTYLE LIBRE 3 SENSOR MISC: 1.0000 | Freq: Three times a day (TID) | 3 refills | Status: DC

## 2022-01-24 NOTE — Telephone Encounter (Signed)
[facility] needs Dr Ranell Patrick to call about an Rx sent over on this patient. Please call [phone].

## 2022-01-24 NOTE — Progress Notes (Signed)
Physical Therapy Session Note  Patient Details  Name: Jeff Willis MRN: [id number] Date of Birth: 01/14/1941  Today's Date: 01/24/2022 PT Individual Time: 1605-1645   40 min   Short Term Goals: Week 1:  PT Short Term Goal 1 (Week 1): STGs = LTGs   Skilled Therapeutic Interventions/Progress Updates:   Pt received supine in bed and agreeable to PT. Supine>sit transfer with supervision assist assist from PT for Foley management. Stand pivot transfer to WC with rollator and supervision assist. Pt transported to entrance of WCC in WC.  Gait trainig with Rollator 90ft+180ft+105ft with 0.5-1L/min O2 throughout. Pt required >1 min on each to rebound from ~84% SpO2 to >90%. Cues for pursed lip breathing throughout gait and during seated rest breaks. Pt returned to room and performed stand pivot transfer to bed with Rollator. Sit>supine completed with supervision assist, and left supine in bed with call bell in reach and all needs met.        Therapy Documentation Precautions:  Precautions Precautions: Fall Precaution Comments: Watch SpO2 and HR - per MD on 9/26 maintain SPO2 at 90% or greater Restrictions Weight Bearing Restrictions: No    Vital Signs: Therapy Vitals Temp: 98.2 F (36.8 C) Pulse Rate: 92 Resp: 18 BP: 116/72 Patient Position (if appropriate): Lying Oxygen Therapy SpO2: 100 % O2 Device: Nasal Cannula Pain: Pain Assessment Pain Scale: 0-10 Pain Score: 0-No pain    Therapy/Group: Individual Therapy  Austin E Tucker 01/24/2022, 4:09 PM  

## 2022-01-24 NOTE — Progress Notes (Signed)
Occupational Therapy Session Note  Patient Details  Name: Jeff Willis MRN: [id number] Date of Birth: [birth year]  Today's Date: 01/24/2022 OT Individual Time: 0920-1021& 1419 - 1508  OT Individual Time Calculation (min): 61 min & 49 min    Short Term Goals:  01/19/22 0950  OT Short Term Goals  Short Term Goals Week Week 1  OT Short Term Goals - Week 1  OT Short Term Goal 1 (Week 1) Pt will complete toilet transfer at ambulatory level with supervision.  OT Short Term Goal 2 (Week 1) Patient    First Session [phone] - 1021) - Skilled Therapeutic Interventions/Progress Updates:  Pt awake in w/c with son present in room upon OT arrival to the room. Pt reports, "I slept on and off" Pt in agreement for OT session.  Therapy Documentation Precautions:  Precautions Precautions: Fall Precaution Comments: Watch SpO2 and HR - per MD on 9/26 maintain SPO2 at 90% or greater Restrictions Weight Bearing Restrictions: No Vital Signs: Please see "Flowsheet" for most recent vitals charted by nursing staff.  Pain: Pain Assessment Pain Scale: 0-10 Pain Score: 0-No pain  ADL: ADL Grooming:  (Pt reports completing oral care prior to OT session. pt's son provides assistance to perform hair management.) Where Assessed-Grooming: Standing at sink Upper Body Bathing: Supervision/safety (Pt able to bathe all UB parts while seated on tub-bench in the shower with close supervision.) Where Assessed-Upper Body Bathing: Shower Lower Body Bathing: Supervision/safety (Pt able to bathe all LB parts while seated on tub-bench in the shower with close supervision and use of grab bars while standing to bathe peri-areas.) Where Assessed-Lower Body Bathing: Shower Upper Body Dressing: Setup (Pt able to doff and don a pull-over style shirt while seated on BSC to prepare and after shower with set-up assist for item retrieval.) Where Assessed-Upper Body Dressing:  (BSC) Lower Body Dressing: Supervision/safety  (Pt able to doff and don shorts and B socks and shoes with close supervision for standing balance with use of rollator while seated in BSC to prep/after shower.) Where Assessed-Lower Body Dressing:  (BSC) Toileting:  (Pt unable to void at this time.) Where Assessed-Toileting:  (BSC placed over toilet) Toilet Transfer: Close supervision (Pt able to perform ambulatory transfer from w/c > BSC placed over the toilet with close supervision for safety with use of rollator.) Toilet Transfer Method: Ambulating Toilet Transfer Equipment:  (BSC placed over the toilet) Gaffer Transfer: Close supervision (Pt able to perform ambulatory transfer from [facility] > tub-bench in the shower with use of grab bars and rollator.) Social research officer, government Method: Heritage manager: Radio broadcast assistant, Grab bars ADL Comments: Pt's SPO2 closely monitored throughout ADLs session. Pt requires use of 1L during mobility and shower, however, SPO2 decreased to ~85% after showering. Therefore provided education on need to increase O2 in preparation for a shower. Education provided to pt and son on energy conservation techniques and importance of pursed-lip breathing. Pt and son verbalize understanding.  Functional Mobility: Pt participates in functional mobility in order to improve endurance and safety with household mobility needed for safe DC home. Pt able to ambulate from room <> day room ~150' with use of rollator and close supervision while on 1L O2 via [place]. Pt requires a prolonged rest break once reaching day room to perform pursed-lip breathing prior to ambulating back. SPO2 closely monitored with ambulating with pt O2 decrease to ~87-89% requiring focus on pursed-lip breathing for ~1-2 minutes prior to increasing.   Pt requested to stay  in the w/c at end of session. Pt left sitting comfortably in the w/c with personal belongings and call light within reach, son present in room, and comfort needs attended  to.   ------------------------------------------------------------------------------------------------------------------------------------------------------------------------------------------------------------------------  Second Session (1419 - 1508) - Skilled Therapeutic Interventions/Progress Updates:  Pt awake bed with family present in room upon OT arrival to the room. Pt reports, "I'm just not used to it," referring to pursed-lip breathing. Pt in agreement for OT session.  Therapy Documentation Pain:   01/24/22 1419  Pain Assessment  Pain Scale 0-10  Pain Score 0     Therapeutic Activity:  Pt participates in therapeutic activity in order to improve independence with pursed-lip breathing and BUE strength/endurance skills which are needed to maintain adequate SPO2 sats during functional activity. Skilled demo provided to pt on using "belly breathing" method to provide tactile prompts for pursed lip breathing. Pt participates in ~10 minutes of belly breathing with maximal, progressing to moderate VC's and demo to perform proper techniques. Pt noted to have increased difficulty with pursed-lip breathing and often performs short inhale and various small alternating exhale/inhale when attempting a prolonged exhale. Pt unable to sustain a prolonged inhale/exhale due to weak diaphragmatic muscles. Pt demo's increased coughing with attempting a prolonged inhale vs exhale. Educated pt and family on how practice pursed-lip breathing using the "belly breathing" guide can improve diaphragmatic strength. Pt and family verbalize understanding.   Skilled demo provided to pt on techniques to perform theraband HEP with the following exercises using a yellow theraband with pt only able to perform ~5 reps of the following exercises while supine in bed with HOB elevated and significantly increased time: elbow flex/ext. However, pt demo's increased coughing with exertion with theraband exercises. OT provides  skills demo to pt and family on techniques for shoulder flex/ext and shoulder horizontal abd. Pt's family verbalizes understanding on pursed-lip breathing techniques.   Pt requested to stay in the w/c at end of session. Pt left sitting comfortably in the w/c with personal belongings and call light within reach, family member present in room, and comfort needs attended to.   Therapy/Group: Individual Therapy  Barbee Shropshire 01/24/2022, 5:28 PM

## 2022-01-24 NOTE — Progress Notes (Signed)
PROGRESS NOTE   Subjective/Complaints: Family asks about foley as patient has noted voided on own and has pain from catheterization His family asks whether he will need insulin at home.  They would like a continuous blood glucose monitor at home.   ROS: Patient denies CP, SOB, N/V/D , non prod cough, +urinary retention  Objective:   No results found. No results for input(s): "WBC", "HGB", "HCT", "PLT" in the last 72 hours. No results for input(s): "NA", "K", "CL", "CO2", "GLUCOSE", "BUN", "CREATININE", "CALCIUM" in the last 72 hours.  Intake/Output Summary (Last 24 hours) at 01/24/2022 1432 Last data filed at 01/24/2022 1317 Gross per 24 hour  Intake 594 ml  Output 1675 ml  Net -1081 ml        Physical Exam: Vital Signs Blood pressure 116/60, pulse 95, temperature 98.1 F (36.7 C), resp. rate 16, height '5\' 6"'$  (1.676 m), weight 49.9 kg, SpO2 96 %.   General: No acute distress Mood and affect are appropriate HEENT: nasal cannula in place Heart: Regular rate and rhythm no rubs murmurs or extra sounds Lungs: Clear to auscultation, breathing unlabored, no rales or wheezes Abdomen: Positive bowel sounds, soft nontender to palpation, nondistended Extremities: No clubbing, cyanosis, or edema Skin: No evidence of breakdown, no evidence of rash Neuro:  Alert and oriented x 3. Normal insight and awareness. Intact Memory. Normal language and speech. Cranial nerve exam unremarkable. Motor 4/5 UE. LE 3/5 HF, 4/5 ADF/PF. No focal sensory findings--stable exam Musculoskeletal: Full ROM, No pain with AROM or PROM in the neck, trunk, or extremities. Posture appropriate     Assessment/Plan: 1. Functional deficits which require 3+ hours per day of interdisciplinary therapy in a comprehensive inpatient rehab setting. Physiatrist is providing close team supervision and 24 hour management of active medical problems listed  below. Physiatrist and rehab team continue to assess barriers to discharge/monitor patient progress toward functional and medical goals  Care Tool:  Bathing    Body parts bathed by patient: Right arm, Left arm, Chest, Abdomen, Front perineal area, Right upper leg, Left upper leg, Face   Body parts bathed by helper: Right lower leg, Left lower leg     Bathing assist Assist Level: Minimal Assistance - Patient > 75%     Upper Body Dressing/Undressing Upper body dressing        Upper body assist Assist Level: Set up assist    Lower Body Dressing/Undressing Lower body dressing      What is the patient wearing?: Pants     Lower body assist Assist for lower body dressing: Minimal Assistance - Patient > 75%     Toileting Toileting    Toileting assist Assist for toileting: Contact Guard/Touching assist     Transfers Chair/bed transfer  Transfers assist     Chair/bed transfer assist level: Supervision/Verbal cueing     Locomotion Ambulation   Ambulation assist      Assist level: Supervision/Verbal cueing Assistive device: Rollator Max distance: >361f   Walk 10 feet activity   Assist     Assist level: Supervision/Verbal cueing Assistive device: Rollator   Walk 50 feet activity   Assist    Assist level: Supervision/Verbal cueing  Assistive device: Rollator    Walk 150 feet activity   Assist Walk 150 feet activity did not occur: Safety/medical concerns  Assist level: Supervision/Verbal cueing Assistive device: Rollator    Walk 10 feet on uneven surface  activity   Assist Walk 10 feet on uneven surfaces activity did not occur: Safety/medical concerns   Assist level: Supervision/Verbal cueing Assistive device: Rollator   Wheelchair     Assist Is the patient using a wheelchair?: No             Wheelchair 50 feet with 2 turns activity    Assist            Wheelchair 150 feet activity     Assist           Blood pressure 116/60, pulse 95, temperature 98.1 F (36.7 C), resp. rate 16, height '5\' 6"'$  (1.676 m), weight 49.9 kg, SpO2 96 %.  Medical Problem List and Plan: 1. Functional deficits secondary to debility after COVID/respiratory failure poor endurance , hypoxia with amb but recovers with rest             -patient may shower             -ELOS/Goals: 7-14 days, supervision to mod I with PT,OT  -Continue CIR therapies including PT, OT  2.  Impaired mobility, ambulating >300 feet, d/c lovenox 3. Pain: decrease Oxycodone to q6H prn. Lidocaine patch for local measures.  4. Mood/Behavior/Sleep: LCSW to follow for evaluation and support.              -antipsychotic agents: N/a 5. Neuropsych/cognition: This patient is capable of making decisions on his own behalf. 6. Skin/Wound Care: Routine pressure relief measures             --maintain adequate nutritional status.  7. Fluids/Electrolytes/Nutrition: Monitor I/O. Check CMET on 09/25             -family bringing in food from home 8. IPF: Prednisone to be tapered every 14 days.             --wean oxygen as tolerated to off.              --Esbriet one pill TID X one week -->2 pills TID and hold there.  9. Prediabetes/steroid induced hyperglycemia: Hgb A1C-5.8 and anticipate that BS will improve with taper and increase in activity.              --change Ensure to Ensure Max with meals.  CBG (last 3)  Recent Labs    01/23/22 2102 01/24/22 0607 01/24/22 1156  GLUCAP 193* 101* 144*    Decrease levemir to 7U and start metformin.    Don't want to restrict what he eats too much as he needs to put on weight.  10. H/o Prostate cancer/Urinary retention: replace foley.  22. H/o dysphagia/Esophageal Web: Soft food. Pills with puree.              -food from home  -will consult GI for second opinion while he is here as per family's request.  12. Cough: changed tussinex to prn as per family preference 68. Postnasal drip: Flonase ordered prn      LOS: 6 days A FACE TO FACE EVALUATION WAS PERFORMED  Jeff Willis 01/24/2022, 2:32 PM

## 2022-01-24 NOTE — Progress Notes (Signed)
Physical Therapy Session Note  Patient Details  Name: Jeff Willis MRN: [id number] Date of Birth: 05-15-1940  Today's Date: 01/24/2022 PT Individual Time: 1100-1159 PT Individual Time Calculation (min): 59 min   Short Term Goals: Week 1:  PT Short Term Goal 1 (Week 1): STGs = LTGs  Skilled Therapeutic Interventions/Progress Updates:   Received pt sitting in WC, pt agreeable to PT treatment and did not c/o pain during session. Session with emphasis on functional mobility/transfers, generalized strengthening and endurance, simulated car transfers, stair navigation, dynamic standing balance/coordination, and gait training. Donned socks and shoes with set up assist and pt performed all transfers with rollator and supervision throughout session with mod cues for brake safety. Pt ambulated 115f with rollator and supervision to main therapy gym. Pt then navigated 8 steps with 2 rails and supervision alternating ascending and descending with a step to and step through pattern. Pt reports his family is working on gMedical laboratory scientific officerinstalled on his steps now. Pt then ambulated 1221fwith rollator and supervision to ortho gym and performed ambulatory simulated car transfer with rollator and supervision then ambulated 1074fn uneven surfaces (ramp) with rollator and supervision. Pt able to stand and pick up small cup using rollator and CGA. Then SPO2 began dropping to 82% and pt unable to recover - therefore increased to 2L and SPO2 >91%. Pt ambulated 65f78fth rollator and supervision back towards room, but had to stop due to fatigue and therapist transported pt back to room on rollator. Pt ambulated 5ft 37fh rollator back to bed and transferred sit<>semi-reclined with supervision. Concluded session with pt semi-reclined in bed, needs within reach, and bed alarm on. Daughter present at beside and NT checking blood glucose levels. Pt left on 1L O2 with SPO2 92%.  Of note, pt continues to require frequent  and significantly extended rest breaks throughout session due to SOB, especially towards end of session.   SPO2: At rest at start of session on 1L: 100% After ambulating to main gym on 0.5L: 89% After navigating steps on 0.5L: 83% After ambulating to ortho gym on 0.5L: 81% After ambulating on ramp on 0.5L: 86% but unable to recover >90% - therefore increased to 1L  Therapy Documentation Precautions:  Precautions Precautions: Fall Precaution Comments: Watch SpO2 and HR - per MD on 9/26 maintain SPO2 at 90% or greater Restrictions Weight Bearing Restrictions: No  Therapy/Group: Individual Therapy Karin Pinedo Alfonse AlpersDPT 01/24/2022, 7:05 AM

## 2022-01-25 ENCOUNTER — Other Ambulatory Visit (HOSPITAL_COMMUNITY): Payer: Self-pay

## 2022-01-25 LAB — GLUCOSE, CAPILLARY
Glucose-Capillary: 80 mg/dL (ref 70–99)
Glucose-Capillary: 106 mg/dL — ABNORMAL HIGH (ref 70–99)
Glucose-Capillary: 191 mg/dL — ABNORMAL HIGH (ref 70–99)
Glucose-Capillary: 187 mg/dL — ABNORMAL HIGH (ref 70–99)

## 2022-01-25 MED ORDER — LIDOCAINE 5 % EX PTCH
1.0000 | MEDICATED_PATCH | CUTANEOUS | 0 refills | Status: DC
  Filled 2022-01-25 – 2022-01-28 (×2): qty 30, 30d supply, fill #0

## 2022-01-25 MED ORDER — POLYETHYLENE GLYCOL 3350 17 GM/SCOOP PO POWD
17.0000 g | Freq: Two times a day (BID) | ORAL | 0 refills | Status: DC
  Filled 2022-01-25: qty 238, 7d supply, fill #0

## 2022-01-25 MED ORDER — METOPROLOL SUCCINATE ER 25 MG PO TB24
25.0000 mg | ORAL_TABLET | Freq: Every day | ORAL | Status: DC
  Administered 2022-01-26 – 2022-01-28 (×3): 25 mg via ORAL
  Filled 2022-01-25 (×3): qty 1

## 2022-01-25 MED ORDER — TAMSULOSIN HCL 0.4 MG PO CAPS: 0.8000 mg | ORAL_CAPSULE | Freq: Every day | ORAL | Status: DC

## 2022-01-25 MED ORDER — SALINE SPRAY 0.65 % NA SOLN
1.0000 | NASAL | 0 refills | Status: DC | PRN
  Filled 2022-01-25: qty 44, 30d supply, fill #0

## 2022-01-25 MED ORDER — PREDNISONE 10 MG PO TABS
ORAL_TABLET | ORAL | 0 refills | Status: DC
  Filled 2022-01-25: qty 88, 42d supply, fill #0

## 2022-01-25 MED ORDER — SULFAMETHOXAZOLE-TRIMETHOPRIM 800-160 MG PO TABS
1.0000 | ORAL_TABLET | ORAL | 0 refills | Status: DC
  Filled 2022-01-25: qty 12, 28d supply, fill #0

## 2022-01-25 MED ORDER — NYSTATIN 100000 UNIT/ML MT SUSP
5.0000 mL | Freq: Four times a day (QID) | OROMUCOSAL | 0 refills | Status: DC
  Filled 2022-01-25: qty 473, 24d supply, fill #0

## 2022-01-25 MED ORDER — METFORMIN HCL 850 MG PO TABS
850.0000 mg | ORAL_TABLET | Freq: Two times a day (BID) | ORAL | 0 refills | Status: DC
  Filled 2022-01-25: qty 60, 30d supply, fill #0

## 2022-01-25 MED ORDER — HYDROCOD POLI-CHLORPHE POLI ER 10-8 MG/5ML PO SUER
5.0000 mL | Freq: Two times a day (BID) | ORAL | 0 refills | Status: DC | PRN
  Filled 2022-01-25: qty 300, 30d supply, fill #0

## 2022-01-25 MED ORDER — METOPROLOL TARTRATE 12.5 MG HALF TABLET
12.5000 mg | ORAL_TABLET | Freq: Two times a day (BID) | ORAL | Status: AC
  Administered 2022-01-25: 12.5 mg via ORAL
  Filled 2022-01-25: qty 1

## 2022-01-25 MED ORDER — PREDNISONE 5 MG PO TABS
5.0000 mg | ORAL_TABLET | Freq: Every day | ORAL | 0 refills | Status: DC
  Filled 2022-01-25: qty 15, 15d supply, fill #0

## 2022-01-25 MED ORDER — MELATONIN 3 MG PO TABS: 3.0000 mg | ORAL_TABLET | Freq: Every evening | ORAL | 0 refills | Status: DC | PRN

## 2022-01-25 MED ORDER — TAMSULOSIN HCL 0.4 MG PO CAPS
0.8000 mg | ORAL_CAPSULE | Freq: Every day | ORAL | 0 refills | Status: DC
  Filled 2022-01-25: qty 60, 30d supply, fill #0

## 2022-01-25 MED ORDER — NYSTATIN 100000 UNIT/ML MT SUSP
5.0000 mL | Freq: Four times a day (QID) | OROMUCOSAL | Status: DC
  Administered 2022-01-25 – 2022-01-28 (×11): 500000 [IU] via ORAL
  Filled 2022-01-25 (×12): qty 5

## 2022-01-25 MED ORDER — SENNA 8.6 MG PO TABS
2.0000 | ORAL_TABLET | Freq: Every day | ORAL | 0 refills | Status: DC
  Filled 2022-01-25: qty 120, 60d supply, fill #0

## 2022-01-25 MED ORDER — FLUCONAZOLE 100 MG PO TABS
100.0000 mg | ORAL_TABLET | Freq: Every day | ORAL | 0 refills | Status: DC
  Filled 2022-01-25: qty 2, 2d supply, fill #0

## 2022-01-25 MED ORDER — GABAPENTIN 250 MG/5ML PO SOLN
100.0000 mg | Freq: Three times a day (TID) | ORAL | 0 refills | Status: DC
  Filled 2022-01-25: qty 180, 30d supply, fill #0

## 2022-01-25 MED ORDER — VITAMIN D (ERGOCALCIFEROL) 1.25 MG (50000 UNIT) PO CAPS
50000.0000 [IU] | ORAL_CAPSULE | ORAL | 0 refills | Status: DC
  Filled 2022-01-25: qty 5, 35d supply, fill #0

## 2022-01-25 MED ORDER — ACETAMINOPHEN 325 MG PO TABS: 325.0000 mg | ORAL_TABLET | ORAL | Status: DC | PRN

## 2022-01-25 MED ORDER — INSULIN DETEMIR 100 UNIT/ML ~~LOC~~ SOLN
5.0000 [IU] | Freq: Every day | SUBCUTANEOUS | Status: DC
  Administered 2022-01-26 – 2022-01-28 (×3): 5 [IU] via SUBCUTANEOUS
  Filled 2022-01-25 (×3): qty 0.05

## 2022-01-25 MED ORDER — FLUCONAZOLE 100 MG PO TABS
100.0000 mg | ORAL_TABLET | Freq: Every day | ORAL | Status: DC
  Administered 2022-01-25 – 2022-01-28 (×4): 100 mg via ORAL
  Filled 2022-01-25 (×4): qty 1

## 2022-01-25 MED ORDER — METOPROLOL SUCCINATE ER 25 MG PO TB24: 25.0000 mg | ORAL_TABLET | Freq: Every day | ORAL | Status: DC

## 2022-01-25 NOTE — Progress Notes (Signed)
Occupational Therapy Session Note  Patient Details  Name: Jeff Willis MRN: [id number] Date of Birth: [birth year]  Today's Date: 01/25/2022 OT Individual Time: 1300-1403 OT Individual Time Calculation (min): 63 min  and Today's Date: 01/25/2022 OT Missed Time: 12 Minutes Missed Time Reason: Patient fatigue   Short Term Goals: Week 1:  OT Short Term Goal 1 (Week 1): STGs=LTGs due to ELOS OT Short Term Goal 2 (Week 1): Patient  Skilled Therapeutic Interventions/Progress Updates:  Skilled OT intervention completed with focus on IADL and home management education/endurance. Pt received upright in bed, no c/o pain. Pt indicated fatigue however agreeable to participate in session.  DIL present, reporting they now need shower chair, with SW advising to purchase OOP due to insurance coverage. Education provided on where they could purchase this item and specific details recommended like a back rest.  Completed bed mobility with mod I to EOB. Completed all sit > stands and ambulatory transfers with supervision using rollator, with assist only needed for managing catheter and O2 lines. Therapist made adjustments to his personal rollator in prep for home. Education provided on using rollator for carrying foley bag, and O2 supply being a personal wearable device vs tank upon d/c.  Ambulated to ADL apartment, then required extended rest break due to fatigue, increased coughing and O2 desat. Demonstrated good recall of pursed lip breathing technique without cues.   In kitchen, pt demonstrated ability to reach into various cabinet heights, gather items, open/close fridge door, all at the [place] I level with use of rollator. Cues were needed for using rollator seat as item carrier vs trying to walk with one hand on the AD. Pt needed cues to safety including closing doors.  Discussed energy conservation techniques including using rollator to reach lower items, modifying the kitchen to easily  retrieve items, having a pulse ox in bedroom, in rollator and in main living area for ease of access.   Pt reported feeling too tired to continue with session with request of cough meds as he reports he had not had any today and coughing was worse. With encouragement, pt was able to ambulate back to room. Nurse notified of medication need. Transitioned into bed with mod I. Remained upright in bed with all immediate needs met at end of session and DIL present at bedside. Pt missed 12 mins of OT intervention due to fatigue; will make up missed mins as time allows.  O2 sat during session (SPO2) -Received on 0.5 L: 98% -After ambulating to ADL apartment: 80%, able to rebound to 85% with extended rest and pursed lip breathing, however in prep for further mobility increased to 1L -After ambulating in kitchen on 1L: 80%, able to rebound to 84% with >5 min break, therefore increased to 2L in prep for ambulation back to room -Left on 1.5 L at 90% with nurse aware  Therapy Documentation Precautions:  Precautions Precautions: Fall Precaution Comments: Watch SpO2 and HR - per MD on 9/26 maintain SPO2 at 90% or greater Restrictions Weight Bearing Restrictions: No    Therapy/Group: Individual Therapy  Blase Mess, MS, OTR/L  01/25/2022, 2:17 PM

## 2022-01-25 NOTE — Progress Notes (Signed)
Physical Therapy Session Note  Patient Details  Name: Jeff Willis MRN: [id number] Date of Birth: [birth year]  Today's Date: 01/25/2022 PT Individual Time: 9390-3009 PT Individual Time Calculation (min): 58 min   Short Term Goals: Week 1:  PT Short Term Goal 1 (Week 1): STGs = LTGs  Skilled Therapeutic Interventions/Progress Updates:   Received pt sitting in [facility] with son present at beside. Pt agreeable to PT treatment and denied any pain during session. Session with emphasis on functional mobility/transfers, generalized strengthening and endurance, dynamic standing balance/coordination, and gait training. Pt donned socks and shoes with set up assist. Pt performed all transfers with rollator and supervision throughout session with intermittent cues for brake safety. Pt's son requesting WC, bedside commode, and with questions about home O2 - answered PT related questions and notified primary OT and CSW of other questions. Pt's son also with questions regarding how to use rollator to approach counter/sink and energy conservation strategies associated with it. Educated pt/pt's on on proper use/positioning of rollator to sit on at sink and importance of locking brakes.  Pt ambulated 144f with rollator and supervision to dayroom and worked on cardiovascular endurance and BUE/LE strengthening on [place] for the following intervals on workload 3 on 0.5L O2 -1.5 minutes - SPO2 81% increasing to 93% -2 minutes and 15 seconds - SPO2 86% increasing to 93% -3 minutes - SPO2 89% increasing to 92% Pt required frequent extended rest breaks throughout session due to SOB. Pt then ambulated 748fwith rollator and supervision back towards room but required seated rest break on rollator and had to be pushed back to room on rollator dependently due to fatigue/SOB. Returned to room and requested to return to bed. Pt ambulated 4f3fithout AD and supervision to bed and transferred sit<>semi-reclined with mod I.  Concluded session with pt semi-reclined in bed with all needs within reach. Pt left on 0.5L O2 with SPO2 95%  SPO2 At rest at start of session on 1L: 98% At rest at start of session on 0.5L: 95% After ambulating to dayroom on 0.5L: 86% increasing to 95%  Therapy Documentation Precautions:  Precautions Precautions: Fall Precaution Comments: Watch SpO2 and HR - per MD on 9/26 maintain SPO2 at 90% or greater Restrictions Weight Bearing Restrictions: No  Therapy/Group: Individual Therapy AnnAlfonse Alpers, DPT 01/25/2022, 6:55 AM

## 2022-01-25 NOTE — Telephone Encounter (Signed)
Notified pharmacy 4x day.

## 2022-01-25 NOTE — Consult Note (Signed)
Reason for Consult: Dysphagia and history of esophageal stricture Referring Physician: Rehab  [name] HPI: This is an 81 year old male with a PMH of IPF with UIP originally admitted for SOB and tachycardia secondary to COVID-19.  The patient contracted the virus on a trip to [place].  He required intubation (12/31/2021) and he was extubated about 7-10 days ago.  Since that time he was making a slow recovery and he is not currently only 1 liter of [place].  He has a history of esophageal strictures and he was dilated on 07/2021.  The dilation was successful up to 15 mm with a Savary dilator.  His son reported that he started to have a recurrence of his dysphagia 2-4 weeks after the dilation.  Currently, his son believes that his dysphagia is worse compared to his dysphagia complaints before the April dilation.  The patient is able to tolerate puree and he swallows pills with intermittent difficulty.  From the pulmonary standpoint he feels that he is recovering well, but he still weak.  Past Medical History:  Diagnosis Date   Back pain    Elevated PSA    Essential tremor    Hypercholesteremia    Neuropathy    Prostate cancer ([place])    Sinus tachycardia 01/30/2018    Past Surgical History:  Procedure Laterality Date   COLONOSCOPY WITH PROPOFOL N/A 08/10/2021   Procedure: COLONOSCOPY WITH PROPOFOL;  Surgeon: Carol Ada, MD;  Location: [facility];  Service: Gastroenterology;  Laterality: N/A;   ESOPHAGEAL DILATION  08/10/2021   Procedure: ESOPHAGEAL DILATION;  Surgeon: Carol Ada, MD;  Location: [facility];  Service: Gastroenterology;;   ESOPHAGOGASTRODUODENOSCOPY (EGD) WITH PROPOFOL N/A 08/10/2021   Procedure: ESOPHAGOGASTRODUODENOSCOPY (EGD) WITH PROPOFOL;  Surgeon: Carol Ada, MD;  Location: [facility];  Service: Gastroenterology;  Laterality: N/A;   HEMOSTASIS CLIP PLACEMENT  08/10/2021   Procedure: HEMOSTASIS CLIP PLACEMENT;  Surgeon: Carol Ada, MD;  Location: [facility];  Service: Gastroenterology;;   LUMBAR MICRODISCECTOMY     L4-5   POLYPECTOMY  08/10/2021   Procedure: POLYPECTOMY;  Surgeon: Carol Ada, MD;  Location: [facility];  Service: Gastroenterology;;   RADIOACTIVE SEED IMPLANT N/A 02/24/2018   Procedure: RADIOACTIVE SEED IMPLANT/BRACHYTHERAPY IMPLANT;  Surgeon: Festus Aloe, MD;  Location: [facility];  Service: Urology;  Laterality: N/A;   RIGHT HEART CATH N/A 11/22/2021   Procedure: RIGHT HEART CATH;  Surgeon: Troy Sine, MD;  Location: [facility];  Service: Cardiovascular;  Laterality: N/A;   SPACE OAR INSTILLATION N/A 02/24/2018   Procedure: SPACE OAR INSTILLATION;  Surgeon: Festus Aloe, MD;  Location: [facility];  Service: Urology;  Laterality: N/A;    Family History  Problem Relation Age of Onset   Other Mother        died at 73 of natural causes   Other Father        unsure of health   Cancer Neg Hx     Social History:  reports that he has never smoked. He has never used smokeless tobacco. He reports that he does not drink alcohol and does not use drugs.  Allergies:  Allergies  Allergen Reactions   Beef-Derived Products    Chicken Protein    Eggs Or Egg-Derived Products    Fish-Derived Products    Meat Extract     vegetarian   Ofev [Nintedanib] Other (See Comments)    Weight loss   Pork-Derived Products     Medications: Scheduled:  aspirin  81 mg Oral Daily   Chlorhexidine Gluconate Cloth  6 each Topical Q0600   enoxaparin (LOVENOX) injection  40 mg Subcutaneous Q24H   fluconazole  100 mg Oral Daily   gabapentin  100 mg Oral TID   insulin aspart  0-5 Units Subcutaneous QHS   insulin aspart  0-9 Units Subcutaneous TID WC   [START ON 01/26/2022] insulin detemir  5 Units Subcutaneous Daily   lidocaine  1 patch Transdermal Q24H   metFORMIN  850 mg Oral BID WC   [START ON 01/26/2022] metoprolol succinate  25 mg Oral Daily   metoprolol tartrate  12.5 mg  Oral BID   multivitamin with minerals  1 tablet Oral Daily   nystatin  5 mL Oral QID   pantoprazole  40 mg Oral BID   Pirfenidone  267 mg Oral 3 times per day   polyethylene glycol  17 g Oral BID   predniSONE  40 mg Oral Q breakfast   Followed by   Derrill Memo ON 01/29/2022] predniSONE  30 mg Oral Q breakfast   Followed by   Derrill Memo ON 02/12/2022] predniSONE  20 mg Oral Q breakfast   Followed by   Derrill Memo ON 02/26/2022] predniSONE  10 mg Oral Q breakfast   Followed by   Derrill Memo ON 03/12/2022] predniSONE  5 mg Oral Q breakfast   Ensure Max Protein  11 oz Oral BID WC   rosuvastatin  10 mg Oral QHS   senna  2 tablet Oral Daily   sulfamethoxazole-trimethoprim  1 tablet Oral Once per day on Mon Wed Fri   tamsulosin  0.8 mg Oral QPC supper   Vitamin D (Ergocalciferol)  50,000 Units Oral Q7 days   Continuous:  Results for orders placed or performed during the hospital encounter of 01/18/22 (from the past 24 hour(s))  Glucose, capillary     Status: Abnormal   Collection Time: 01/24/22  5:11 PM  Result Value Ref Range   Glucose-Capillary 135 (H) 70 - 99 mg/dL  Glucose, capillary     Status: Abnormal   Collection Time: 01/24/22  8:53 PM  Result Value Ref Range   Glucose-Capillary 141 (H) 70 - 99 mg/dL  Glucose, capillary     Status: None   Collection Time: 01/25/22  6:07 AM  Result Value Ref Range   Glucose-Capillary 80 70 - 99 mg/dL  Glucose, capillary     Status: Abnormal   Collection Time: 01/25/22 11:38 AM  Result Value Ref Range   Glucose-Capillary 106 (H) 70 - 99 mg/dL     No results found.  ROS:  As stated above in the HPI otherwise negative.  Blood pressure 110/66, pulse 84, temperature 98.4 F (36.9 C), temperature source Oral, resp. rate 16, height '5\' 6"'$  (1.676 m), weight 49.9 kg, SpO2 100 %.    PE: Gen: NAD, Alert and Oriented HEENT:  [place]/AT, EOMI Neck: Supple, no LAD Lungs: CTA Bilaterally, low volume of inspiration CV: RRR without M/G/R ABD: Soft, NTND, +BS Ext: No  C/C/E  Assessment/Plan: 1) Dysphagia. 2) History of esophageal strictures. 3) IPF. 4) Recent COVID-19 infection.   He is clinically stable.  The option of having a dilation tomorrow with [facility] weekend call or to wait and have me repeat the procedure were provided for him.  The consensus was to allow me the opportunity to repeat the EGD with dilation as an outpatient.  Plan: 1) EGD with dilation as an outpatient.  Hopefully next week.  Scheduling is difficult, but  the office is currently trying to make arrangements.  Lillith Mcneff D 01/25/2022, 4:17 PM

## 2022-01-25 NOTE — Progress Notes (Signed)
Occupational Therapy Session Note  Patient Details  Name: Jeff Willis MRN: [id number] Date of Birth: 12-17-1940  Today's Date: 01/25/2022 OT Individual Time: 1015-1100 OT Individual Time Calculation (min): 45 min      Skilled Therapeutic Interventions/Progress Updates: Patient received resting in bed on 1/2 liter of O2 via [place] with family present. Assisted patient to EOB with only SBA. Patient O2 sat @ 86 when initially sitting up and rebounded to 91% after seated deep breathing. Ambulated to toilet with rollator close SBA to manage equipment. Worked on family training of new DME delivered to the room while patient was on the commode. Reommened using phone video for portable O2 education so they would have a reminder of tank set up. Patient's family report having space in the bathroom for [facility] and shower chair. Patient will also have a concentrator for home use. Worked with family to educate on safety with the long tubing and set-up of a central space, as the tube will only allow ~50 feet radius. Good family engagement throughout education. Patient feeling constipated and needing medical intervention. Nursing aware and assisting. Missed last portion of treatment due to bowel care.      Therapy Documentation Precautions:  Precautions Precautions: Fall Precaution Comments: Watch SpO2 and HR - per MD on 9/26 maintain SPO2 at 90% or greater Restrictions Weight Bearing Restrictions: No General: General OT Amount of Missed Time: 30 Minutes Vital Signs:O2 86-92 with activity on 1/2 liter via [place]   Pain: No c/o pain during treatment   ADL: CGA/SBA to ambulate to the toilet   Balance Fair + with mobility at rollator level     Therapy/Group: Individual Therapy  Hermina Barters 01/25/2022, 12:48 PM

## 2022-01-25 NOTE — Progress Notes (Addendum)
[name], male   DOB: 1940-07-12, 81 y.o.   MRN: [id number]  Therapy team have asked for home O2 and wheelchair, son reports will get transport chair on own since not covered. Will get O2 sats for justification for home o2 at home. Gave son's FMLA papers to Pam[employer]PA. Work toward discharge Monday  9:35 AM Order placed for 3 in 1 and home O2 via [employer].

## 2022-01-25 NOTE — Telephone Encounter (Addendum)
THe pharmacy called back today.  They need to know how often he is going to be testing her blood sugars on the order for the test strips.

## 2022-01-25 NOTE — Progress Notes (Signed)
Occupational Therapy Discharge Summary  Patient Details  Name: Jeff Willis MRN: [id number] Date of Birth: [birth year]  Date of Discharge from OT service:{Time; dates multiple:[id number]}   Patient has met 8 of 8 long term goals due to improved activity tolerance, improved balance, and improved coordination.  Patient to discharge at overall Modified Independent- supervision level.  Patient's care partner wife, daughter in law, and multiple sons are able to provide the necessary  supervision   assistance at discharge and have been present for multiple sessions to learn safety techniques and fall prevention strategies.    Reasons goals not met: N/A  Recommendation:  Patient will benefit from ongoing skilled OT services in home health setting to continue to advance functional skills in the area of BADL and iADL.  Equipment: Shower chair with back and push up rails, 3 in 1 BSC  Reasons for discharge: treatment goals met  Patient/family agrees with progress made and goals achieved: Yes  OT Discharge Precautions/Restrictions  Precautions Precautions: Fall Precaution Comments: maintain SPO2 90% or greater Restrictions Weight Bearing Restrictions: No ADL ADL Eating: Independent Where Assessed-Eating: Bed level Grooming: Modified independent Where Assessed-Grooming: Standing at sink Upper Body Bathing: Supervision/safety Where Assessed-Upper Body Bathing: Shower Lower Body Bathing: Supervision/safety Where Assessed-Lower Body Bathing: Shower Upper Body Dressing: Modified independent (Device) Where Assessed-Upper Body Dressing: Edge of bed Lower Body Dressing: Modified independent Where Assessed-Lower Body Dressing: Sitting at sink, Standing at sink Toileting: Modified independent Where Assessed-Toileting: Bedside Commode, Toilet Toilet Transfer: Modified independent Toilet Transfer Method: Ambulating (rollator) Science writer: Raised toilet seat, Bedside  commode Tub/Shower Transfer: Close supervison Tub/Shower Transfer Method:  (step over tub threshold) Tub/Shower Equipment: Civil engineer, contracting without back, Energy manager: Modified independent Social research officer, government Method: Ambulating, Stand pivot Youth worker: Radio broadcast assistant, Grab bars Vision Baseline Vision/History: 1 Wears glasses Patient Visual Report: No change from baseline Vision Assessment?: No apparent visual deficits Perception  Perception: Within Functional Limits Praxis Praxis: Intact Cognition Cognition Overall Cognitive Status: Within Functional Limits for tasks assessed Arousal/Alertness: Awake/alert Orientation Level: Person;Place;Situation Person: Oriented Place: Oriented Situation: Oriented Memory: Appears intact Awareness: Appears intact Problem Solving: Appears intact Safety/Judgment: Appears intact Brief Interview for Mental Status (BIMS) Repetition of Three Words (First Attempt): 3 Temporal Orientation: Year: Correct Temporal Orientation: Month: Accurate within 5 days Temporal Orientation: Day: Correct Recall: "Sock": Yes, no cue required Recall: "Blue": Yes, no cue required Recall: "Bed": Yes, no cue required BIMS Summary Score: 15 Sensation Sensation Light Touch: Appears Intact Hot/Cold: Appears Intact Proprioception: Appears Intact Stereognosis: Not tested Coordination Gross Motor Movements are Fluid and Coordinated: Yes Fine Motor Movements are Fluid and Coordinated: No Finger Nose Finger Test: WNL BUE; mild to moderate tremor bilateral hands at baseline Motor    Mobility     Trunk/Postural Assessment     Balance   Extremity/Trunk Assessment RUE Assessment RUE Assessment: Within Functional Limits General Strength Comments: 4+/5 proximally LUE Assessment LUE Assessment: Within Functional Limits General Strength Comments: 4+/5 proximmally   Jeff Willis [name] 01/25/2022, 3:50 PM

## 2022-01-25 NOTE — Progress Notes (Signed)
PROGRESS NOTE   Subjective/Complaints: Spoke with son at bedside about weaning insulin- decrease dose today and continue to wean over weekend, will d/c Monday Foley replaced Ordered Freestyle Libre for home  ROS: Patient denies CP, SOB, N/V/D , +cough, +urinary retention  Objective:   No results found. No results for input(s): "WBC", "HGB", "HCT", "PLT" in the last 72 hours. No results for input(s): "NA", "K", "CL", "CO2", "GLUCOSE", "BUN", "CREATININE", "CALCIUM" in the last 72 hours.  Intake/Output Summary (Last 24 hours) at 01/25/2022 1059 Last data filed at 01/25/2022 7829 Gross per 24 hour  Intake 354 ml  Output 1650 ml  Net -1296 ml        Physical Exam: Vital Signs Blood pressure 110/66, pulse 84, temperature 98.4 F (36.9 C), temperature source Oral, resp. rate 16, height '5\' 6"'$  (1.676 m), weight 49.9 kg, SpO2 100 %.   General: No acute distress Mood and affect are appropriate HEENT: nasal cannula in place Heart: Regular rate and rhythm no rubs murmurs or extra sounds Lungs: Clear to auscultation, breathing unlabored, no rales or wheezes Abdomen: Positive bowel sounds, soft nontender to palpation, nondistended Extremities: No clubbing, cyanosis, or edema Skin: No evidence of breakdown, no evidence of rash Neuro:  Alert and oriented x 3. Normal insight and awareness. Intact Memory. Normal language and speech. Cranial nerve exam unremarkable. Motor 4/5 UE. LE 3/5 HF, 4/5 ADF/PF. No focal sensory findings--stable exam Musculoskeletal: Full ROM, No pain with AROM or PROM in the neck, trunk, or extremities. Posture appropriate  Ambulating with RW with supervision    Assessment/Plan: 1. Functional deficits which require 3+ hours per day of interdisciplinary therapy in a comprehensive inpatient rehab setting. Physiatrist is providing close team supervision and 24 hour management of active medical problems listed  below. Physiatrist and rehab team continue to assess barriers to discharge/monitor patient progress toward functional and medical goals  Care Tool:  Bathing    Body parts bathed by patient: Right arm, Left arm, Chest, Abdomen, Front perineal area, Right upper leg, Left upper leg, Face   Body parts bathed by helper: Right lower leg, Left lower leg     Bathing assist Assist Level: Minimal Assistance - Patient > 75%     Upper Body Dressing/Undressing Upper body dressing        Upper body assist Assist Level: Set up assist    Lower Body Dressing/Undressing Lower body dressing      What is the patient wearing?: Pants     Lower body assist Assist for lower body dressing: Minimal Assistance - Patient > 75%     Toileting Toileting    Toileting assist Assist for toileting: Contact Guard/Touching assist     Transfers Chair/bed transfer  Transfers assist     Chair/bed transfer assist level: Supervision/Verbal cueing     Locomotion Ambulation   Ambulation assist      Assist level: Supervision/Verbal cueing Assistive device: Rollator Max distance: >387f   Walk 10 feet activity   Assist     Assist level: Supervision/Verbal cueing Assistive device: Rollator   Walk 50 feet activity   Assist    Assist level: Supervision/Verbal cueing Assistive device: Rollator  Walk 150 feet activity   Assist Walk 150 feet activity did not occur: Safety/medical concerns  Assist level: Supervision/Verbal cueing Assistive device: Rollator    Walk 10 feet on uneven surface  activity   Assist Walk 10 feet on uneven surfaces activity did not occur: Safety/medical concerns   Assist level: Supervision/Verbal cueing Assistive device: Rollator   Wheelchair     Assist Is the patient using a wheelchair?: No             Wheelchair 50 feet with 2 turns activity    Assist            Wheelchair 150 feet activity     Assist           Blood pressure 110/66, pulse 84, temperature 98.4 F (36.9 C), temperature source Oral, resp. rate 16, height '5\' 6"'$  (1.676 m), weight 49.9 kg, SpO2 100 %.  Medical Problem List and Plan: 1. Functional deficits secondary to debility after COVID/respiratory failure poor endurance , hypoxia with amb but recovers with rest             -patient may shower             -ELOS/Goals: 7-14 days, supervision to mod I with PT,OT  Continue CIR therapies including PT, OT  2.  Impaired mobility, ambulating >300 feet, d/c lovenox 3. Pain: decrease Oxycodone to q6H prn. Lidocaine patch for local measures.  4. Mood/Behavior/Sleep: LCSW to follow for evaluation and support.              -antipsychotic agents: N/a 5. Neuropsych/cognition: This patient is capable of making decisions on his own behalf. 6. Skin/Wound Care: Routine pressure relief measures             --maintain adequate nutritional status.  7. Fluids/Electrolytes/Nutrition: Monitor I/O. Check CMET on 09/25             -family bringing in food from home 8. IPF: Prednisone to be tapered every 14 days.             --wean oxygen as tolerated to off.              --Esbriet one pill TID X one week -->2 pills TID and hold there.  9. Prediabetes/steroid induced hyperglycemia: Hgb A1C-5.8 and anticipate that BS will improve with taper and increase in activity.              --change Ensure to Ensure Max with meals.  CBG (last 3)  Recent Labs    01/24/22 1711 01/24/22 2053 01/25/22 0607  GLUCAP 135* 141* 80    Decrease levemir to 6U and start metformin.    Don't want to restrict what he eats too much as he needs to put on weight.  10. H/o Prostate cancer/Urinary retention: replace foley.  38. H/o dysphagia/Esophageal Web: Soft food. Pills with puree.              -food from home  -will consult GI for second opinion while he is here as per family's request.  12. Cough: changed tussinex to prn as per family preference, discussed that we can  provide 1 week supply upon discharge. 13. Postnasal drip: Flonase ordered prn, continue     LOS: 7 days A FACE TO FACE EVALUATION WAS PERFORMED  Coye Dawood P Shandel Busic 01/25/2022, 10:59 AM

## 2022-01-26 LAB — GLUCOSE, CAPILLARY
Glucose-Capillary: 89 mg/dL (ref 70–99)
Glucose-Capillary: 136 mg/dL — ABNORMAL HIGH (ref 70–99)
Glucose-Capillary: 168 mg/dL — ABNORMAL HIGH (ref 70–99)
Glucose-Capillary: 136 mg/dL — ABNORMAL HIGH (ref 70–99)

## 2022-01-26 MED ORDER — CHLORHEXIDINE GLUCONATE CLOTH 2 % EX PADS
6.0000 | MEDICATED_PAD | Freq: Two times a day (BID) | CUTANEOUS | Status: DC
  Administered 2022-01-26 – 2022-01-27 (×3): 6 via TOPICAL

## 2022-01-26 NOTE — Progress Notes (Signed)
Family wants to complete discharge education after lunch time with family.

## 2022-01-27 LAB — GLUCOSE, CAPILLARY
Glucose-Capillary: 74 mg/dL (ref 70–99)
Glucose-Capillary: 115 mg/dL — ABNORMAL HIGH (ref 70–99)
Glucose-Capillary: 151 mg/dL — ABNORMAL HIGH (ref 70–99)
Glucose-Capillary: 174 mg/dL — ABNORMAL HIGH (ref 70–99)

## 2022-01-27 MED ORDER — ALBUTEROL SULFATE HFA 108 (90 BASE) MCG/ACT IN AERS
2.0000 | INHALATION_SPRAY | Freq: Four times a day (QID) | RESPIRATORY_TRACT | Status: DC | PRN
  Administered 2022-01-27: 2 via RESPIRATORY_TRACT
  Filled 2022-01-27: qty 6.7

## 2022-01-27 MED ORDER — LIDOCAINE 5 % EX PTCH: 1.0000 | MEDICATED_PATCH | Freq: Every day | CUTANEOUS | Status: DC | PRN

## 2022-01-27 MED ORDER — AEROCHAMBER PLUS FLO-VU LARGE MISC
1.0000 | Freq: Once | Status: AC
  Administered 2022-01-27: 1
  Filled 2022-01-27: qty 1

## 2022-01-27 MED ORDER — AEROCHAMBER PLUS FLO-VU MEDIUM MISC
1.0000 | Freq: Once | Status: DC
  Filled 2022-01-27 (×2): qty 1

## 2022-01-27 MED ORDER — ALBUTEROL SULFATE HFA 108 (90 BASE) MCG/ACT IN AERS
2.0000 | INHALATION_SPRAY | Freq: Four times a day (QID) | RESPIRATORY_TRACT | 0 refills | Status: DC | PRN
  Filled 2022-01-27: qty 6.7, 25d supply, fill #0

## 2022-01-27 NOTE — Progress Notes (Addendum)
PROGRESS NOTE   Subjective/Complaints:  Family at bedside after PT session, endorsing confident with family education and discharge plan. Have already received most of his medications from the pharmacy, however son requests lidocaine patch supply for 1 month at discharge as it is less expensive than OTC.   Also note increased cough with activity over the last 2-3 days, dry, without increased oxygen needs but is extremely bothersome to patient. Family denies any home short or long acting inhalers despite chronic Dx IPF; agree to PRN albuterol inhaler until pulmonology follow up, also request spacer and education to assist in patient use.   GI evaluated, plan for OP dilation within next week or so.   ROS: Patient denies CP, SOB, N/V/D , +cough, +urinary retention  Objective:   No results found. No results for input(s): "WBC", "HGB", "HCT", "PLT" in the last 72 hours. No results for input(s): "NA", "K", "CL", "CO2", "GLUCOSE", "BUN", "CREATININE", "CALCIUM" in the last 72 hours.  Intake/Output Summary (Last 24 hours) at 01/27/2022 2328 Last data filed at 01/27/2022 2109 Gross per 24 hour  Intake --  Output 1225 ml  Net -1225 ml         Physical Exam: Vital Signs Blood pressure 118/80, pulse 89, temperature 98.1 F (36.7 C), resp. rate 15, height '5\' 6"'$  (1.676 m), weight 50.3 kg, SpO2 99 %.   General: No acute distress Mood and affect are appropriate HEENT: nasal cannula in place Heart: Regular rate and rhythm no rubs murmurs or extra sounds Lungs: Mild dry crackles right base, otherwise CTAB. Dry, persistent cough. Abdomen: Positive bowel sounds, soft nontender to palpation, nondistended Extremities: No clubbing, cyanosis, or edema Skin: No evidence of breakdown, no evidence of rash Neuro:  Alert and oriented x 3. Normal insight and awareness. Intact Memory. Normal language and speech. Cranial nerve exam unremarkable.   MSK: Moving all 4 limbs antigravity in bed.    Assessment/Plan: 1. Functional deficits which require 3+ hours per day of interdisciplinary therapy in a comprehensive inpatient rehab setting. Physiatrist is providing close team supervision and 24 hour management of active medical problems listed below. Physiatrist and rehab team continue to assess barriers to discharge/monitor patient progress toward functional and medical goals  Care Tool:  Bathing    Body parts bathed by patient: Right arm, Left arm, Chest, Abdomen, Front perineal area, Right upper leg, Left upper leg, Face   Body parts bathed by helper: Right lower leg, Left lower leg     Bathing assist Assist Level: Independent with assistive device     Upper Body Dressing/Undressing Upper body dressing        Upper body assist Assist Level: Independent with assistive device    Lower Body Dressing/Undressing Lower body dressing      What is the patient wearing?: Pants     Lower body assist Assist for lower body dressing: Independent with assitive device     Toileting Toileting    Toileting assist Assist for toileting: Independent with assistive device     Transfers Chair/bed transfer  Transfers assist     Chair/bed transfer assist level: Independent with assistive device     Locomotion Ambulation   Ambulation  assist      Assist level: Independent with assistive device Assistive device: Rollator Max distance: 177f   Walk 10 feet activity   Assist     Assist level: Independent with assistive device Assistive device: Rollator   Walk 50 feet activity   Assist    Assist level: Independent with assistive device Assistive device: Rollator    Walk 150 feet activity   Assist Walk 150 feet activity did not occur: Safety/medical concerns  Assist level: Independent with assistive device Assistive device: Rollator    Walk 10 feet on uneven surface  activity   Assist Walk 10 feet  on uneven surfaces activity did not occur: Safety/medical concerns   Assist level: Supervision/Verbal cueing Assistive device: Rollator   Wheelchair     Assist Is the patient using a wheelchair?: No Type of Wheelchair: Manual Wheelchair activity did not occur: N/A         Wheelchair 50 feet with 2 turns activity    Assist    Wheelchair 50 feet with 2 turns activity did not occur: N/A       Wheelchair 150 feet activity     Assist  Wheelchair 150 feet activity did not occur: N/A       Blood pressure 118/80, pulse 89, temperature 98.1 F (36.7 C), resp. rate 15, height '5\' 6"'$  (1.676 m), weight 50.3 kg, SpO2 99 %.  Medical Problem List and Plan: 1. Functional deficits secondary to debility after COVID/respiratory failure poor endurance , hypoxia with amb but recovers with rest             -patient may shower             -ELOS/Goals: 7-14 days, supervision to mod I with PT,OT  Continue CIR therapies including PT, OT  2.  Impaired mobility, ambulating >300 feet, d/c lovenox 3. Pain: decrease Oxycodone to q6H prn. Lidocaine patch for local measures - adding lidocaine patch to DC medication orders at [email or web address]. Mood/Behavior/Sleep: LCSW to follow for evaluation and support.              -antipsychotic agents: N/a 5. Neuropsych/cognition: This patient is capable of making decisions on his own behalf. 6. Skin/Wound Care: Routine pressure relief measures             --maintain adequate nutritional status.  7. Fluids/Electrolytes/Nutrition: Monitor I/O. Check CMET on 09/25             -family bringing in food from home 8. IPF: Prednisone to be tapered every 14 days.             --wean oxygen as tolerated to off.              --Esbriet one pill TID X one week -->2 pills TID and hold there.             9. Prediabetes/steroid induced hyperglycemia: Hgb A1C-5.8 and anticipate that BS will improve with taper and increase in activity.              --change Ensure to  Ensure Max with meals.  CBG (last 3)  Recent Labs    01/27/22 1241 01/27/22 1658 01/27/22 2106  GLUCAP 115* 151* 174*     - Decrease levemir to 6U and start metformin.                          - Don't want to restrict what he eats too much as  he needs to put on weight.                           - 10/1 - BG remain <200, >70; continue current regimen  10. H/o Prostate cancer/Urinary retention: replace foley.  56. H/o dysphagia/Esophageal Web: Soft food. Pills with puree.              -food from home  -will consult GI for second opinion while he is here as per family's request.  - OP follow up 12. Cough: changed tussinex to prn as per family preference, discussed that we can provide 1 week supply upon discharge.              - 10/1 PRN albuterol inhaler with spacer added for activity induced cough. Nursing to demo use. 13. Postnasal drip: Flonase ordered prn, continue     LOS: 9 days A FACE TO FACE EVALUATION WAS [place] 01/27/2022, 11:28 PM

## 2022-01-27 NOTE — Progress Notes (Signed)
Occupational Therapy Session Note  Patient Details  Name: ARMSTRONG CREASY MRN: [id number] Date of Birth: [birth year]  Today's Date: 01/27/2022 OT Individual Time: 1100-1152 OT Individual Time Calculation (min): 52 min    Short Term Goals: Week 1:  OT Short Term Goal 1 (Week 1): STGs=LTGs due to ELOS OT Short Term Goal 2 (Week 1): Patient  Skilled Therapeutic Interventions/Progress Updates:    Upon OT arrival, pt semi recumbent in bed with multiple family members present in room for family education. Pt on .5L O2 and reports no pain. Pt agreeable to OT treatment. Treatment intervention with a focus on family training to promote safe discharge. Pt's family requesting to review bed transfer and tub/shower transfer. Pt completes supine to sit transfer Mod I and donns socks and shoes Mod I seated EOB. Pt completes sit to stand transfer Mod I with 4WW and ambulates to rehab apartment requiring one long seated rest break 3/4 of the way there. Pt's O2 dropped to 76%. Pt completes pursed lip breathing and O2 was increased to 2L with O2 sats at 90%. Pt requests to be transported remainder of way via 4WW and total A. Pt completes transfer to bed with Mod I with 4WW and sit to supine transfer Mod I. Pt returns to seated position Mod I and ambulates to bathroom Mod I with 4WW. Family educated on shower chair versus tub transfer bench and recommend grab bars  Pt completes tub transfer with Mod I and pt's family feels comfortable assisting pt at this level. Pt ambulates back to the room with 4WW without rest break and requests to return to bed.. Pt with multiple coughing episodes during session. Pt completes transfer from 4WW to bed Mod I, doffs shoes Mod I and returns to supine Mod I. Pt's O2 read 95% while supine and O2 was decreased to .5L. Educated pt's family about being Mod I in the room and are able to assist with bathing pt. Pt missed 8 minutes secondary to patient fatigue. Pt was left in bed with all  needs met.   Therapy Documentation Precautions:  Precautions Precautions: Fall Precaution Comments: maintain SPO2 90% or greater Restrictions Weight Bearing Restrictions: No    Therapy/Group: Individual Therapy  Marvetta Gibbons 01/27/2022, 12:21 PM

## 2022-01-27 NOTE — Progress Notes (Signed)
Physical Therapy Discharge Summary  Patient Details  Name: Jeff Willis MRN: [id number] Date of Birth: Aug 31, 1940  Date of Discharge from PT service:January 27, 2022  Today's Date: 01/27/2022 PT Individual Time: 8119-1478 PT Individual Time Calculation (min): 57 min   Patient has met 7 of 9 long term goals due to improved activity tolerance, improved balance, improved postural control, increased strength, ability to compensate for deficits, improved awareness, and improved coordination. Patient to discharge at an ambulatory level Modified Independent using rollator. Patient's care partner is independent to provide the necessary physical assistance at discharge. Pt's family members attended family education training and verbalized and demonstrated confidence with all tasks to ensure safe discharge home.   Reasons goals not met: Pt did not meet car transfer goal of mod I as pt currently requires supervision due to need for cues for rollator safety when entering the car. Pt also will not be able to lift rollator in/out of car independently. Pt did not meet community ambulation goal of >143f mod I due to decreased endurance/activity tolerance, fatigue, and SOB.   Recommendation:  Patient will benefit from ongoing skilled PT services in home health setting to continue to advance safe functional mobility, address ongoing impairments in transfers, generalized strengthening and endurance, dynamic standing balance/coordination, gait training, and to minimize fall risk.  Equipment: Rollator, home O2  Reasons for discharge: treatment goals met  Patient/family agrees with progress made and goals achieved: Yes  Today's Interventions: Received pt sitting in WC, pt agreeable to PT treatment, and denied any pain during session. Session with emphasis on discharge planning, family education training, functional mobility/transfers, dynamic standing balance, generalized strengthening and endurance,  stair navigation, simulated car transfers, and gait training. Went through pain interference questionnaire, sensation, and MMT. Pt performed all transfers with rollator and mod I throughout session. Educated pt's family on rollator safety/brake management. Pt ambulated 1569fwith rollator and mod I to main therapy gym. Took seated rest break on rollator and pt's family reported having 1 STE with grab bar on L side. Pt then navigated 4 steps with L handrail and close supervision to simulate home entry; ascending and descending with a step to pattern. Pt then ambulated 12581fith rollator and mod I to ortho gym and performed ambulatory simulated car transfer with rollator and supervision (cues for safe entry with rollator). Pt transported back to room on rollator dependently due to fatigue and to conserve energy for upcoming OT session. Discussed O2 saturations and managing O2 at home while keeping SPO2 >90%. Pt requested to return to bed and transferred rollator<>bed with supervision (for foley and O2 management). Concluded session with pt sitting EOB with all needs within reach and family present at bedside. Pt left on 0.5L O2 with SPO2 93%  SPO2: At rest during start of session on 0.5L: 98% After ambulating to main gym on 0.5L: 86% increasing to 95% After navigating steps: 83% increasing to 94% After ambulating to ortho gym: 76% increasing to 93%  PT Discharge Precautions/Restrictions Precautions Precautions: Fall Precaution Comments: maintain SPO2 90% or greater Restrictions Weight Bearing Restrictions: No Pain Interference Pain Interference Pain Effect on Sleep: 1. Rarely or not at all Pain Interference with Therapy Activities: 1. Rarely or not at all Pain Interference with Day-to-Day Activities: 1. Rarely or not at all Cognition Overall Cognitive Status: Within Functional Limits for tasks assessed Arousal/Alertness: Awake/alert Orientation Level: Oriented X4 Memory: Appears  intact Awareness: Appears intact Problem Solving: Appears intact Safety/Judgment: Appears intact Comments: requires occasional cues  for rollator brake management Sensation Sensation Light Touch: Appears Intact Proprioception: Appears Intact Additional Comments: pt reports baseline neuropathy on bilateral feet Coordination Gross Motor Movements are Fluid and Coordinated: Yes Fine Motor Movements are Fluid and Coordinated: No Finger Nose Finger Test: bilateral tremors Heel Shin Test: [facility] but slow Motor  Motor Motor: Within Functional Limits Motor - Skilled Clinical Observations: generalized weakness and deconditioning  Mobility Bed Mobility Bed Mobility: Rolling Right;Rolling Left;Sit to Supine;Supine to Sit Rolling Right: Independent with assistive device Rolling Left: Independent with assistive device Supine to Sit: Independent with assistive device Sit to Supine: Independent with assistive device Transfers Transfers: Sit to Stand;Stand to Sit;Stand Pivot Transfers Sit to Stand: Independent with assistive device Stand to Sit: Independent with assistive device Stand Pivot Transfers: Independent with assistive device Transfer (Assistive device): Rollator Locomotion  Gait Ambulation: Yes Gait Assistance: Independent with assistive device Gait Distance (Feet): 150 Feet Assistive device: Rollator Gait Gait: Yes Gait Pattern: Impaired Gait Pattern: Step-through pattern;Decreased step length - right;Decreased step length - left;Decreased stride length;Trunk flexed;Poor foot clearance - left;Poor foot clearance - right;Narrow base of support Gait velocity: decreased Stairs / Additional Locomotion Stairs: Yes Stairs Assistance: Supervision/Verbal cueing Stair Management Technique: Two rails Number of Stairs: 8 Height of Stairs: 6 Ramp: Supervision/Verbal cueing (rollator) Pick up small object from the floor assist level: Contact Guard/Touching assist Pick up small object  from the floor assistive device: small cup with rollator Wheelchair Mobility Wheelchair Mobility: No  Trunk/Postural Assessment  Cervical Assessment Cervical Assessment: Within Functional Limits Thoracic Assessment Thoracic Assessment: Exceptions to [facility] (rounded shoulders) Lumbar Assessment Lumbar Assessment: Exceptions to [facility] (mild posterior pelvic tilt in sitting) Postural Control Postural Control: Within Functional Limits  Balance Balance Balance Assessed: Yes Static Sitting Balance Static Sitting - Balance Support: Feet supported;Bilateral upper extremity supported Static Sitting - Level of Assistance: 7: Independent Dynamic Sitting Balance Dynamic Sitting - Balance Support: Feet supported;No upper extremity supported Dynamic Sitting - Level of Assistance: 6: Modified independent (Device/Increase time) Static Standing Balance Static Standing - Balance Support: Bilateral upper extremity supported;During functional activity (rollator) Static Standing - Level of Assistance: 6: Modified independent (Device/Increase time) Dynamic Standing Balance Dynamic Standing - Balance Support: Bilateral upper extremity supported;During functional activity (rollator) Dynamic Standing - Level of Assistance: 6: Modified independent (Device/Increase time) Dynamic Standing - Comments: with transfers and gait Extremity Assessment  RLE Assessment RLE Assessment: Exceptions to [facility] General Strength Comments: grossly generalized to 4+/5 LLE Assessment LLE Assessment: Exceptions to [facility] General Strength Comments: grossly generalized to 4+/5   Alfonse Alpers PT, DPT  01/27/2022, 7:16 AM

## 2022-01-28 ENCOUNTER — Other Ambulatory Visit (HOSPITAL_COMMUNITY): Payer: Self-pay

## 2022-01-28 DIAGNOSIS — D696 Thrombocytopenia, unspecified: Secondary | ICD-10-CM

## 2022-01-28 DIAGNOSIS — R339 Retention of urine, unspecified: Secondary | ICD-10-CM

## 2022-01-28 DIAGNOSIS — R1310 Dysphagia, unspecified: Secondary | ICD-10-CM

## 2022-01-28 LAB — CBC
HCT: 33.8 % — ABNORMAL LOW (ref 39.0–52.0)
Hemoglobin: 12 g/dL — ABNORMAL LOW (ref 13.0–17.0)
MCH: 33.4 pg (ref 26.0–34.0)
MCHC: 35.5 g/dL (ref 30.0–36.0)
MCV: 94.2 fL (ref 80.0–100.0)
Platelets: 127 10*3/uL — ABNORMAL LOW (ref 150–400)
RBC: 3.59 MIL/uL — ABNORMAL LOW (ref 4.22–5.81)
RDW: 13.7 % (ref 11.5–15.5)
WBC: 7.1 10*3/uL (ref 4.0–10.5)
nRBC: 0 % (ref 0.0–0.2)

## 2022-01-28 LAB — BASIC METABOLIC PANEL
Anion gap: 6 (ref 5–15)
BUN: 18 mg/dL (ref 8–23)
CO2: 28 mmol/L (ref 22–32)
Calcium: 9.1 mg/dL (ref 8.9–10.3)
Chloride: 99 mmol/L (ref 98–111)
Creatinine, Ser: 1.02 mg/dL (ref 0.61–1.24)
GFR, Estimated: >60 mL/min (ref >60)
Glucose, Bld: 94 mg/dL (ref 70–99)
Potassium: 4 mmol/L (ref 3.5–5.1)
Sodium: 133 mmol/L — ABNORMAL LOW (ref 135–145)

## 2022-01-28 LAB — GLUCOSE, CAPILLARY: Glucose-Capillary: 76 mg/dL (ref 70–99)

## 2022-01-28 NOTE — Progress Notes (Signed)
Inpatient Rehabilitation Care Coordinator Discharge Note   Patient Details  Name: Jeff Willis MRN: [id number] Date of Birth: [birth year]   Discharge location: Home  Length of Stay: 10 Days  Discharge activity level: Sup  Home/community participation: Spouse/son/DIL  Patient response AV:[id number] Literacy - How often do you need to have someone help you when you read instructions, pamphlets, or other written material from your doctor or pharmacy?: Never  Patient response AX:KPVVZS Isolation - How often do you feel lonely or isolated from those around you?: Never  Services provided included: SW, Pharmacy, TR, CM, RN, SLP, OT, RD, PT, MD  Financial Services:  Financial Services Utilized: [place] offered to/list presented to:    Follow-up services arranged:  [place]: [facility]         Patient response to transportation need: Is the patient able to respond to transportation needs?: Yes In the past 12 months, has lack of transportation kept you from medical appointments or from getting medications?: No In the past 12 months, has lack of transportation kept you from meetings, work, or from getting things needed for daily living?: No    Comments (or additional information):  Patient/Family verbalized understanding of follow-up arrangements:  Yes  Individual responsible for coordination of the follow-up plan: patient/family  Confirmed correct DME delivered: Dyanne Iha 01/28/2022    Dyanne Iha

## 2022-01-28 NOTE — Progress Notes (Signed)
[name], male   DOB: 1940-08-15, 81 y.o.   MRN: [id number]  Transport chair ordered through [facility]. Set to deliver to patients home.

## 2022-01-28 NOTE — Discharge Summary (Signed)
Physician Discharge Summary  [name] MRN: [id number] DOB/AGE: July 10, 1940 81 y.o.  Admit date: 01/18/2022 Discharge date: 01/28/2022  Discharge Diagnoses:  Principal Problem:   Debility Active Problems:   Upper airway cough syndrome   Interstitial lung disease (HCC)   Laryngopharyngeal reflux (LPR)   Spinal stenosis of lumbosacral region   Hyponatremia   Protein-calorie malnutrition, severe   Urinary retention   Thrombocytopenia ([place])   Odynophagia   Discharged Condition: stable  Significant Diagnostic Studies: DG CHEST PORT 1 VIEW  Result Date: 01/22/2022 CLINICAL DATA:  Chest radiograph 01/10/2022 EXAM: PORTABLE CHEST 1 VIEW COMPARISON:  Cough FINDINGS: No pleural effusion. No pneumothorax. No focal airspace opacity. Bibasilar hazy interstitial opacities appears slightly less conspicuous than on prior imaging. Unchanged cardiac and mediastinal contours. There is a subdiaphragmatic lucency on the left, which is favored to represent air within a gas distended stomach. No acute osseous abnormality. IMPRESSION: 1. Slight interval improvement in hazy bilateral pulmonary opacities 2. Left-sided subdiaphragmatic lucency is favored to representair within a gas distended stomach. If there is clinical concern for abdominal pathology, a dedicated abdominal radiograph is recommended for further evaluation. Electronically Signed   By: Marin Roberts M.D.   On: 01/22/2022 10:19    Labs:  Basic Metabolic Panel:    Latest Ref Rng & Units 01/28/2022    7:06 AM 01/21/2022    9:01 AM 01/16/2022    6:01 AM  BMP  Glucose 70 - 99 mg/dL 94  155  88   BUN 8 - 23 mg/dL '18  15  13   '$ Creatinine 0.61 - 1.24 mg/dL 1.02  1.04  0.89   Sodium 135 - 145 mmol/L 133  138  140   Potassium 3.5 - 5.1 mmol/L 4.0  3.4  3.6   Chloride 98 - 111 mmol/L 99  102  102   CO2 22 - 32 mmol/L '28  30  29   '$ Calcium 8.9 - 10.3 mg/dL 9.1  9.2  9.1      CBC:    Latest Ref Rng & Units 01/28/2022     7:06 AM 01/21/2022    9:01 AM 01/16/2022    6:01 AM  CBC  WBC 4.0 - 10.5 K/uL 7.1  8.6  11.0   Hemoglobin 13.0 - 17.0 g/dL 12.0  12.8  11.4   Hematocrit 39.0 - 52.0 % 33.8  37.4  33.0   Platelets 150 - 400 K/uL 127  197  183      CBG: Recent Labs  Lab 01/27/22 0623 01/27/22 1241 01/27/22 1658 01/27/22 2106 01/28/22 0557  GLUCAP 74 115* 151* 174* 76    Brief HPI:   Jeff Willis is a 81 y.o. male with history of IPF w/UIP, HTN, prostate cancer, esophageal with s/p dilatation who was admitted on 12/31/2021 with hypoxia, tachycardia and low-grade fevers due to COVID diagnosis towards the end of his cruise.  He was treated with 5-day course of antibiotics as well as remdesivir, steroids and Actemra.  He required intubation briefly for worsening of respiratory status and was extubated to 100% HFNC.  Elevated D-dimer felt to be in setting of COVID as BLE Dopplers were negative for DVT and CTA was negative for PE.  MBS done showed no signs of aspiration but that 13 mm pill to lodge briefly in large briefly vallecular space.     Hospital course has also been significant for issues with hypoglycemia, acute hypoxic respiratory failure due to aspiration pneumonia with pneumonitis,  urinary retention as well as abdominal distention.  Aspiration pneumonia treated with closely Unasyn and IV Solu-Medrol with recommendations of slow steroid taper.  Foley remains in place with recommendations for voiding trial in a week as well as supplemental oxygen with ongoing semiproductive cough.  PT/OT was working with patient who continued to be limited by debility and decreased endurance.  CIR was recommended due to functional decline.   Hospital Course: Jeff Willis was admitted to rehab 01/18/2022 for inpatient therapies to consist of PT and OT at least three hours five days a week. Past admission physiatrist, therapy team and rehab RN have worked together to provide customized collaborative inpatient  rehab.  He was noted to have desaturation with activity and therapy has worked and educated patient on energy conservation measures.  His oxygen has been weaned down to half to 1 L.    His blood pressures were monitored on TID basis and has been stable on home dose beta-blocker.  Lopressor was transitioned back to Toprol-XL prior to discharge.  He has also had issues with low back pain exacerbated by his debility and immobility.    Lidocaine patches as well as aqua therapy has been used for local measures.  Serial check of CBC shows H&H to be improving and mild drop in platelets noted without signs of bleeding.  Recommend repeat CBC in 1 to 2 weeks to monitor for stability.  Serial BMET showed hypokalemia which has improved with supplementation and mild hyponatremia noted.  He has history of prediabetes with Hgb A1c-5.8 and was kept on liberalized diet to improve food choices.  Family has also been providing food from home to help supplement intake. His blood sugars have been monitored with ac/hs CBG checks and SSI was use prn for tighter BS control.  Levemir was weaned off, metformin was added and titrated to 850 mg bid with  improvement in blood sugar control.  Anticipate blood sugars will continue to improve as steroids weaned off and activity levels improved.    His foley was discontinued and he was started on voiding program.  He continued to have problems with retention despite increasing Flomax to 0.8 mg nightly.  Due to painful catheterizations, Foley was replaced and family has set an appointment for voiding trial with urology this week.  He continues to have dry cough and Tussionex twice daily was added to help provide some relief.  He also reports odynophagia with difficulty swallowing and was noted to have thrush.  Nystatin mouthwash was added and he was treated with 5-day course of Diflucan.  Dr. Benson Norway was consulted for patient and family felt that patient's symptoms were worse and plans are for  esophageal dilatation past discharge.  He will continue to receive home health PT and OT by [facility] after discharge.   Rehab course: During patient's stay in rehab weekly team conferences were held to monitor patient's progress, set goals and discuss barriers to discharge. At admission, patient required min assist with basic ADL tasks and with mobility. He  has had improvement in activity tolerance, balance, postural control as well as ability to compensate for deficits. He is able to complete ADL tasks at modified independent level.  He is independent for transfers and able to ambulate 150 feet with use of assistive device.  He continues to have issues with fatigue, poor energy levels as well as hypoxia requiring supplemental oxygen half to 1 L to keep saturations over 90%.  Family education has been completed.  Disposition: Home  Diet: Textures as tolerated. Carb restrictions.   Special Instructions: Recommend CBC/BMET in 1-2 weeks to monitor for stability.   2. Complete foley care twice a day.     Allergies as of 01/28/2022       Reactions   Beef-derived Products    Chicken Protein    Eggs Or Egg-derived Products    Fish-derived Products    Meat Extract    vegetarian   Ofev [nintedanib] Other (See Comments)   Weight loss   Pork-derived Products         Medication List     STOP taking these medications    cyclobenzaprine 5 MG tablet Commonly known as: FLEXERIL   enoxaparin 40 MG/0.4ML injection Commonly known as: LOVENOX   feeding supplement (GLUCERNA SHAKE) Liqd   guaiFENesin 100 MG/5ML liquid Commonly known as: ROBITUSSIN   insulin aspart 100 UNIT/ML injection Commonly known as: novoLOG   insulin detemir 100 UNIT/ML injection Commonly known as: LEVEMIR   ipratropium-albuterol 0.5-2.5 (3) MG/3ML Soln Commonly known as: DUONEB   metoprolol tartrate 25 MG tablet Commonly known as: LOPRESSOR   ondansetron 4 MG tablet Commonly known as: ZOFRAN    polyethylene glycol 17 g packet Commonly known as: MIRALAX / GLYCOLAX Replaced by: polyethylene glycol powder 17 GM/SCOOP powder       TAKE these medications    acetaminophen 325 MG tablet Commonly known as: TYLENOL Take 1-2 tablets (325-650 mg total) by mouth every 4 (four) hours as needed for mild pain. What changed:  how much to take when to take this reasons to take this   albuterol 108 (90 Base) MCG/ACT inhaler Commonly known as: VENTOLIN HFA Inhale 2 puffs into the lungs every 6 (six) hours as needed for wheezing or shortness of breath.   aspirin EC 81 MG tablet Take 81 mg by mouth daily. Swallow whole.   chlorpheniramine-HYDROcodone 10-8 MG/5ML--Rx 300 ml Commonly known as: TUSSIONEX Take 5 mLs by mouth every 12 (twelve) hours as needed for cough.   Deep Sea Nasal Spray 0.65 % nasal spray Generic drug: sodium chloride Place 1 spray into both nostrils as needed for congestion.   fluconazole 100 MG tablet Commonly known as: DIFLUCAN Take 1 tablet (100 mg total) by mouth daily.   FreeStyle Libre 3 Sensor Misc 1 each by Does not apply route 4 (four) times daily -  before meals and at bedtime. Place 1 sensor on the skin every 14 days. Use to check glucose continuously   FREESTYLE LITE test strip Generic drug: glucose blood Use ACH/HS to check CBGs   gabapentin 250 MG/5ML solution Commonly known as: NEURONTIN Take 2 mLs (100 mg total) by mouth 3 (three) times daily.   lidocaine 5 % Commonly known as: LIDODERM Place 1 patch onto the skin daily. Remove & Discard patch within 12 hours or as directed by MD Notes to patient: Can pay out of pocket or purchase over the counter which is cheaper. Apply at 6 pm and remove at 6 am daily.    melatonin 3 MG Tabs tablet Take 1 tablet (3 mg total) by mouth at bedtime as needed.   metFORMIN 850 MG tablet Commonly known as: GLUCOPHAGE Take 1 tablet (850 mg total) by mouth 2 (two) times daily with a meal.   metoprolol  succinate 25 MG 24 hr tablet Commonly known as: TOPROL-XL Take 1 tablet (25 mg total) by mouth daily.   multivitamin with minerals Tabs tablet Take 1 tablet by mouth daily.  nystatin 100000 UNIT/ML suspension Commonly known as: MYCOSTATIN Take 5 mLs (500,000 Units total) by mouth 4 (four) times daily.   pantoprazole 40 MG tablet Commonly known as: PROTONIX Take 1 tablet (40 mg total) by mouth 2 (two) times daily.   Pirfenidone 267 MG Tabs Commonly known as: Esbriet Take 2 tablets (534 mg total) by mouth in the morning, at noon, and at bedtime. **LOW DOSE AS MAINTENANCE** What changed: how much to take   polyethylene glycol powder 17 GM/SCOOP powder Commonly known as: GLYCOLAX/MIRALAX Take 17 g by mouth 2 (two) times daily. Replaces: polyethylene glycol 17 g packet   predniSONE 10 MG tablet Commonly known as: DELTASONE Take 4 tablets ('40mg'$ ) by mouth on 10/03. Decrease to 3 tabs ('30mg'$ ) daily 10/04 to 10/16, then 2 tabs ('20mg'$ ) daily 10/17-10/30, then decrease to 1 tab ('10mg'$ ) 10/31 to 11/13. What changed:  medication strength how much to take how to take this when to take this additional instructions Another medication with the same name was removed. Continue taking this medication, and follow the directions you see here.   predniSONE 5 MG tablet Commonly known as: DELTASONE Beginning on 03/12/22, Take 1 tablet (5 mg total) by mouth daily with breakfast. Start taking on: March 12, 2022 What changed:  medication strength how much to take These instructions start on March 12, 2022. If you are unsure what to do until then, ask your doctor or other care provider. Another medication with the same name was removed. Continue taking this medication, and follow the directions you see here.   rosuvastatin 10 MG tablet Commonly known as: CRESTOR Take 10 mg by mouth at bedtime.   senna 8.6 MG Tabs tablet Commonly known as: SENOKOT Take 2 tablets (17.2 mg total) by mouth  daily.   sulfamethoxazole-trimethoprim 800-160 MG tablet Commonly known as: BACTRIM DS Take 1 tablet by mouth 3 (three) times a week. Notes to patient: On Mon, Wed, Thu   tamsulosin 0.4 MG Caps capsule Commonly known as: FLOMAX Take 2 capsules (0.8 mg total) by mouth daily after supper. What changed: how much to take   Vitamin D (Ergocalciferol) 1.25 MG (50000 UNIT) Caps capsule Commonly known as: DRISDOL Take 1 capsule (50,000 Units total) by mouth every 7 (seven) days.        Follow-up Information     Gaynelle Arabian, MD Follow up.   Specialty: Family Medicine Why: Call in 1-2 days for post hospital follow up Contact information: [address] [place] [address] [phone]         Izora Ribas, MD Follow up.   Specialty: Physical Medicine and Rehabilitation Contact information: [address] [place] [address] [phone]         Juanita Craver, MD Follow up.   Specialty: Gastroenterology Contact information: [address] [place] [address] [phone]                 Signed: Bary Leriche 01/28/2022, 5:14 PM

## 2022-01-28 NOTE — Progress Notes (Signed)
Inpatient Rehabilitation Discharge Medication Review by a Pharmacist  A complete drug regimen review was completed for this patient to identify any potential clinically significant medication issues.  High Risk Drug Classes Is patient taking? Indication by Medication  Antipsychotic No   Anticoagulant No   Antibiotic Yes Bactrim - ppx? Fluconazole - fungal ppx Nystatin-fungal ppx  Opioid Yes Tussinex - cough   Antiplatelet Yes Aspirin -   Hypoglycemics/insulin Yes Metformin - DM   Vasoactive Medication Yes Metoprolol - HTN  Chemotherapy No   Other Yes Pantoprazole - dysphagia  Senna/Miralax - constipation  Tamsulosin - urinary retention  Melatonin - sleep  Multivitamin, ergocalciferol - supplement  Pirfenidone, prednisone - IPF  Lidocaine patch - pain  Albuterol-cough Acetaminophen - pain  Rosuvastatin - Hyperlipidemia Gabapentin-pain     Type of Medication Issue Identified Description of Issue Recommendation(s)  Drug Interaction(s) (clinically significant)     Duplicate Therapy  Metoprolol and toprol D/c unneeded agent  Allergy     No Medication Administration End Date     Incorrect Dose     Additional Drug Therapy Needed     Significant med changes from prior encounter (inform family/care partners about these prior to discharge).    Other       Clinically significant medication issues were identified that warrant physician communication and completion of prescribed/recommended actions by midnight of the next day:  [facility]  Name of provider notified for urgent issues identified: Reesa Chew  Provider Method of Notification: Phone    Pharmacist comments: duplication resolved  Time spent performing this drug regimen review (minutes):  30

## 2022-01-28 NOTE — Progress Notes (Signed)
PROGRESS NOTE   Subjective/Complaints: Son asks for lidocaine patch for lower back- we will order Daughter-in-law asks for wheelchair, discussed with SW and PT Foley education with son and patient's wife today Labs stable this morning  ROS: Patient denies CP, SOB, N/V/D , +cough- dry, +urinary retention  Objective:   No results found. Recent Labs    01/28/22 0706  WBC 7.1  HGB 12.0*  HCT 33.8*  PLT 127*   Recent Labs    01/28/22 0706  NA 133*  K 4.0  CL 99  CO2 28  GLUCOSE 94  BUN 18  CREATININE 1.02  CALCIUM 9.1    Intake/Output Summary (Last 24 hours) at 01/28/2022 0946 Last data filed at 01/28/2022 0500 Gross per 24 hour  Intake --  Output 925 ml  Net -925 ml        Physical Exam: Vital Signs Blood pressure 106/73, pulse 94, temperature 98.5 F (36.9 C), resp. rate 18, height '5\' 6"'$  (1.676 m), weight 50.1 kg, SpO2 99 %.   General: No acute distress, BMI 17.83 Mood and affect are appropriate HEENT: nasal cannula in place Heart: Regular rate and rhythm no rubs murmurs or extra sounds Lungs: Mild dry crackles right base, otherwise CTAB. Dry, persistent cough. Abdomen: Positive bowel sounds, soft nontender to palpation, nondistended Extremities: No clubbing, cyanosis, or edema Skin: No evidence of breakdown, no evidence of rash Neuro:  Alert and oriented x 3. Normal insight and awareness. Intact Memory. Normal language and speech. Cranial nerve exam unremarkable.  MSK: Moving all 4 limbs antigravity in bed.    Assessment/Plan: 1. Functional deficits which require 3+ hours per day of interdisciplinary therapy in a comprehensive inpatient rehab setting. Physiatrist is providing close team supervision and 24 hour management of active medical problems listed below. Physiatrist and rehab team continue to assess barriers to discharge/monitor patient progress toward functional and medical goals  Care  Tool:  Bathing    Body parts bathed by patient: Right arm, Left arm, Chest, Abdomen, Front perineal area, Right upper leg, Left upper leg, Face   Body parts bathed by helper: Right lower leg, Left lower leg     Bathing assist Assist Level: Independent with assistive device     Upper Body Dressing/Undressing Upper body dressing        Upper body assist Assist Level: Independent with assistive device    Lower Body Dressing/Undressing Lower body dressing      What is the patient wearing?: Pants     Lower body assist Assist for lower body dressing: Independent with assitive device     Toileting Toileting    Toileting assist Assist for toileting: Independent with assistive device     Transfers Chair/bed transfer  Transfers assist     Chair/bed transfer assist level: Independent with assistive device     Locomotion Ambulation   Ambulation assist      Assist level: Independent with assistive device Assistive device: Rollator Max distance: 140f   Walk 10 feet activity   Assist     Assist level: Independent with assistive device Assistive device: Rollator   Walk 50 feet activity   Assist    Assist level: Independent  with assistive device Assistive device: Rollator    Walk 150 feet activity   Assist Walk 150 feet activity did not occur: Safety/medical concerns  Assist level: Independent with assistive device Assistive device: Rollator    Walk 10 feet on uneven surface  activity   Assist Walk 10 feet on uneven surfaces activity did not occur: Safety/medical concerns   Assist level: Supervision/Verbal cueing Assistive device: Rollator   Wheelchair     Assist Is the patient using a wheelchair?: No Type of Wheelchair: Manual Wheelchair activity did not occur: N/A         Wheelchair 50 feet with 2 turns activity    Assist    Wheelchair 50 feet with 2 turns activity did not occur: N/A       Wheelchair 150 feet  activity     Assist  Wheelchair 150 feet activity did not occur: N/A       Blood pressure 106/73, pulse 94, temperature 98.5 F (36.9 C), resp. rate 18, height '5\' 6"'$  (1.676 m), weight 50.1 kg, SpO2 99 %.  Medical Problem List and Plan: 1. Functional deficits secondary to debility after COVID/respiratory failure poor endurance , hypoxia with amb but recovers with rest             -patient may shower             -ELOS/Goals: 7-14 days, supervision to mod I with PT,OT  Continue CIR therapies including PT, OT  2.  Impaired mobility, ambulating >300 feet, d/c lovenox 3. Pain: discontinue oxycodone. Lidocaine patch for local measures - adding lidocaine patch to DC medication orders at [place] 4. Mood/Behavior/Sleep: LCSW to follow for evaluation and support.              -antipsychotic agents: N/a 5. Neuropsych/cognition: This patient is capable of making decisions on his own behalf. 6. Skin/Wound Care: Routine pressure relief measures             --maintain adequate nutritional status.  7. Fluids/Electrolytes/Nutrition: Monitor I/O. Check CMET on 09/25             -family bringing in food from home 8. IPF: Prednisone to be tapered every 14 days.             --wean oxygen as tolerated to off.              --Esbriet one pill TID X one week -->2 pills TID and hold there.             9. Prediabetes/steroid induced hyperglycemia: Hgb A1C-5.8 and anticipate that BS will improve with taper and increase in activity.              --change Ensure to Ensure Max with meals.  CBG (last 3)  Recent Labs    01/27/22 1658 01/27/22 2106 01/28/22 0557  GLUCAP 151* 174* 76    - Discontinue levemir and start metformin.                          - Don't want to restrict what he eats too much as he needs to put on weight.                           - 10/1 - BG remain <200, >70; continue current regimen  10. H/o Prostate cancer/Urinary retention: replace foley.  27. H/o dysphagia/Esophageal Web:  Soft food. Pills with puree.              -  food from home  -will consult GI for second opinion while he is here as per family's request.  - OP follow up 12. Cough: changed tussinex to prn as per family preference, discussed that we can provide 1 week supply upon discharge.              - 10/1 PRN albuterol inhaler with spacer added for activity induced cough. Nursing to demo use. 13. Postnasal drip: Flonase ordered prn, continue   >30 minutes spent in discharge of patient including review of medications and follow-up appointments, physical examination, and in answering all patient's questions      LOS: 10 days A FACE TO FACE EVALUATION WAS [place] 01/28/2022, 9:46 AM

## 2022-01-29 ENCOUNTER — Other Ambulatory Visit: Payer: Self-pay | Admitting: Gastroenterology

## 2022-02-04 ENCOUNTER — Telehealth: Payer: Self-pay | Admitting: Internal Medicine

## 2022-02-04 ENCOUNTER — Other Ambulatory Visit: Payer: Self-pay | Admitting: Cardiovascular Disease

## 2022-02-04 NOTE — Telephone Encounter (Signed)
Melissa from [facility] states patient is using 0.5L of o2 at rest with sat levels at 92% but would like o2 instructions for when patient is moving due to sat levels dropping at 82% when moving. Also wanted to know if a nebulizer machine would be beneficial to have and if it could be called in- unsure of DME. Please advise.

## 2022-02-04 NOTE — Telephone Encounter (Signed)
MR, please advise on the info from [facility] with [facility] what you recommend.

## 2022-02-05 NOTE — Telephone Encounter (Signed)
    This is classic for IPF.  They will need to increase the oxygen as much as as needed to keep him at 88%.  If this is not possible as he is greater than 85%

## 2022-02-05 NOTE — Telephone Encounter (Signed)
ATC Melissa, she did not answer. Will attempt to call back later.

## 2022-02-06 ENCOUNTER — Telehealth: Payer: Self-pay

## 2022-02-06 ENCOUNTER — Encounter
Payer: Medicare Other | Attending: Physical Medicine and Rehabilitation | Admitting: Physical Medicine and Rehabilitation

## 2022-02-06 DIAGNOSIS — E162 Hypoglycemia, unspecified: Secondary | ICD-10-CM

## 2022-02-06 DIAGNOSIS — R739 Hyperglycemia, unspecified: Secondary | ICD-10-CM | POA: Diagnosis not present

## 2022-02-06 NOTE — Telephone Encounter (Signed)
Mr. Allinson son called to called  report his father's blood glucose level  drops at night. The CBG can be from 60-64.   Also does he need to continue the Nystatin Suspension?   Son can be reached on phone [phone].

## 2022-02-06 NOTE — Progress Notes (Signed)
   Subjective:    [name], male    DOB: [birth year], 81 y.o.   MRN: [id number]  HPI An audio/video tele-health visit is felt to be the most appropriate encounter for this patient at this time. This is a follow up tele-visit via phone. The patient is at home. MD is at office. Prior to scheduling this appointment, our staff discussed the limitations of evaluation and management by telemedicine and the availability of in-person appointments. The patient expressed understanding and agreed to proceed.   1) Hypoglycemic and hyperglycemic CBGs -son has been using freestyle libre 3 which has continuous CBG trends -he notes that after dinner his CBGs spikes to 200s and then closer to bedtime it drops to 62 -they have stopped his evening metformin dose -he is taking '850mg'$  in the morning still -he was asymptomatic when his CBG drops  Review of Systems     Objective:   Physical Exam Not performed       Assessment & Plan:  1) Type 2 diabetes with hyperglycemic and hypoglycemic episodes -recommended not treating CBGs of 62 with chocolate ice cream if patient is not symptomatic. Discussed that this will help CBGs stay lower on average.  -discussed avoiding added sugars and gluten -discussed that if CBGs decrease, we can decrease daytime metformin dose -continue use of freestyle libre 3  10 minutes spent in discussion of patient's CBG fluctuations, agreeing with son stopping evening metformin dose, recommending not overtreating low CBGs if patient is asymptomatic to help lower average glucose levels.

## 2022-02-08 NOTE — Telephone Encounter (Signed)
ATC Melissa at the # provided, [phone] and was told I had the wrong #. I called the patient x1.  LVM to return call.

## 2022-02-25 ENCOUNTER — Encounter: Payer: Self-pay | Admitting: Internal Medicine

## 2022-02-25 ENCOUNTER — Ambulatory Visit (INDEPENDENT_AMBULATORY_CARE_PROVIDER_SITE_OTHER): Payer: Medicare Other | Admitting: Internal Medicine

## 2022-02-25 ENCOUNTER — Telehealth: Payer: Self-pay

## 2022-02-25 VITALS — BP 112/62 | HR 89 | Temp 98.0°F | Ht 66.0 in | Wt 114.8 lb

## 2022-02-25 DIAGNOSIS — R634 Abnormal weight loss: Secondary | ICD-10-CM

## 2022-02-25 DIAGNOSIS — Z8709 Personal history of other diseases of the respiratory system: Secondary | ICD-10-CM

## 2022-02-25 DIAGNOSIS — J84112 Idiopathic pulmonary fibrosis: Secondary | ICD-10-CM

## 2022-02-25 DIAGNOSIS — Z8616 Personal history of COVID-19: Secondary | ICD-10-CM | POA: Diagnosis not present

## 2022-02-25 DIAGNOSIS — M549 Dorsalgia, unspecified: Secondary | ICD-10-CM

## 2022-02-25 DIAGNOSIS — Z7189 Other specified counseling: Secondary | ICD-10-CM

## 2022-02-25 DIAGNOSIS — R0989 Other specified symptoms and signs involving the circulatory and respiratory systems: Secondary | ICD-10-CM

## 2022-02-25 DIAGNOSIS — R5381 Other malaise: Secondary | ICD-10-CM

## 2022-02-25 DIAGNOSIS — R053 Chronic cough: Secondary | ICD-10-CM | POA: Diagnosis not present

## 2022-02-25 DIAGNOSIS — R1319 Other dysphagia: Secondary | ICD-10-CM

## 2022-02-25 LAB — PULMONARY FUNCTION TEST
DL/VA % pred: 68 %
DL/VA: 2.94 ml/min/mmHg/L
DLCO cor % pred: 34 %
DLCO cor: 9.22 ml/min/mmHg
DLCO unc % pred: 31 %
DLCO unc: 8.46 ml/min/mmHg
FEF 25-75 Pre: 2.92 L/sec
FEF2575-%Pred-Pre: 167 %
FEV1-%Pred-Pre: 74 %
FEV1-Pre: 1.75 L
FEV1FVC-%Pred-Pre: 117 %
FEV6-%Pred-Pre: 62 %
FEV6-Pre: 1.95 L
FEV6FVC-%Pred-Pre: 99 %
FVC-%Pred-Pre: 63 %
FVC-Pre: 1.96 L
Pre FEV1/FVC ratio: 89 %
Pre FEV6/FVC Ratio: 100 %

## 2022-02-25 MED ORDER — PREDNISONE 5 MG PO TABS: ORAL_TABLET | ORAL | 0 refills | Status: DC

## 2022-02-25 MED ORDER — GABAPENTIN 250 MG/5ML PO SOLN: ORAL | 0 refills | Status: DC

## 2022-02-25 MED ORDER — SULFAMETHOXAZOLE-TRIMETHOPRIM 800-160 MG PO TABS: 1.0000 | ORAL_TABLET | ORAL | 1 refills | Status: DC

## 2022-02-25 NOTE — Progress Notes (Signed)
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   Prior cough in  2019 for several months  resolved on on some kind of powder inhaler x one week = Advair     History of Present Illness  08/04/2019  Pulmonary/ 1st office eval/Jeff Willis  Chief Complaint  Patient presents with   Pulmonary Consult    Referred by Dr Jeff Willis. Pt c/o non prod cough and throat clearing x 6 months.   Dyspnea:  None/ no aerobics, walking neighborhoods ok  Cough: always dry/ any candy helps if keeps in mouth but esp hard candy / does not use cough drops  Sleep: rotates on side, bed is flat/ one pillow, never wakes up cough but /win 15 min of stirring recurs daily  SABA use: never, also never prednisone Always trouble swallowing pills x lifetime  Call Dr Jeff Willis office and ask what inhaler you received in 2019 as sample that seemed to help your cough = advair dpi Stop cetrizine, flonase  Prednisone 10 mg take  4 each am x 2 days,   2 each am x 2 days,  1 each am x 2 days and stop  Pantoprazole (protonix) 40 mg   Take  30-60 min before first meal of the day and Pepcid (famotidine)  20 mg one after supper  until return to office  GERD   Gabapentin 100 mg four times daily (ok to stop when no coughing at all for a week)  Please schedule a follow up office visit in 4 weeks, sooner if needed  with all medications /inhalers/ solutions in hand so we can verify exactly what you are taking. This includes all  medications from all doctors and over the counters    09/06/2019  f/u ov/Jeff Willis re: uacs on gabapenitn 100 mg three times  wolicki had tried gabapentin but failed to reveal that at last ov when I recommended it  Chief Complaint  Patient presents with   Follow-up    4 wk f/u. Wants to discuss switching Advair.   Dyspnea:  No  Cough: better just while on prednisone maybe 50-80%   Sleeping: no noct cough SABA use: none 02: none  rec Gabapentin 200 mg up to 4 x daily  Pantoprazole (protonix) 40 mg   Take  30-60 min before first meal of the day and Pepcid (famotidine)  20 mg one @  bedtime until return to office - this is the best way to tell whether stomach acid is contributing to your problem.   Keep the candy handy  Prednisone 10 mg  Take 4 for three days 3 for three days 2 for three days 1 for three days and stop  If not better in 2 weeks see Dr Jeff Willis for your reflux  Please remember to go to the lab department and xray dept  for your tests - we will call  you with the results when they are available.         OV 10/05/2019  Subjective:  [name], male , DOB: [birth year] , age 81 y.o. , MRN: [id number] , ADDRESS: [place] [id number]   10/05/2019 -   Chief Complaint  Patient presents with   Follow-up    Pt states he has been doing okay since last visit and denies any complaints.   -81 year old Jeff Willis male.  Referred for interstitial lung disease.  He is accompanied by his son Jeff Willis who is a Software engineer.  I know Jeff Willis  from a few years ago when heused to accompany his aunt; asthma patient of mine. Jeff Willis  has has been referred by Dr. Christinia Willis to the ILD center.  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  Up until the pandemic used to travel quite frequently.  He is to go to [place] for 3 months.  At none of the properties he is being exposed to mold.  He developed insidious onset of chronic cough over a year ago  this has been persistent.  In the last few months several cough related treatments have been tried with some improvement.  Has been on gabapentin but this made him sleepy and is currently stopped it.  Because of chronic cough which he only attributes his constant clearing of the throat he had a high-resolution CT scan of the chest that I personally visualized and shows probable UIP pattern and therefore he has been referred here.  In my personal visualization it might even be definite of UIP because there might be some early honeycombing there.  He denies any shortness of breath.  At rest he was found to be tachycardic but he says this can at baseline for him.  He will follow this up with his primary care physician.  [facility]( ILD Questionnaire  Symptoms:   He denies any shortness of breath.  No episodic shortness of breath.  However on the questioning he does report mild shortness of breath climbing up stairs.  In terms of his cough it started in June 2020.  It is the same since it started maybe in the last week it is slightly better.  Is moderate in intensity.  He does clear the throat.  Mostly dry cough occasionally white sputum.  Does feel a tickle in the back of his throat.  He is not waking up in the middle of the night.  Occasionally affects his voice.  No nausea no vomiting no diarrhea no wheezing.   Past Medical History : Denies any asthma COPD or heart failure rheumatoid arthritis or scleroderma or lupus or polymyositis or Sjogren's.  Denies sleep apnea.  Denies HIV denies pulmonary hypertension.  Denies diabetes or thyroid disease or stroke or seizures mononucleosis.  Denies any hepatitis or tuberculosis or kidney disease or pneumonia or blood clots or heart disease or pleurisy.  Does have on and off acid reflux for the last year   ROS: Positive for dry mouth for the last several months.  He also has some dysphagia for swallowing pills unclear for what duration.  He  also has acid reflux on PPI and H2 blocker.  Denies any Raynaud's or recurrent fever or weight loss.  No nausea no vomiting.   FAMILY HISTORY of LUNG DISEASE: * -Denies family history of COPD or asthma sarcoid or cystic fibrosis of hypersensitive pneumonitis or autoimmune disease.   EXPOSURE HISTORY: Denies any  Covid.  Denies smoking cigarettes.  Denies marijuana denies smoking.  Denies IV drug use denies cocaine   HOME and HOBBY DETAILS : Single-family home [place] for the last 10 years.  Age of the home is 15 years.  No dampness.  No mildew.  No humidifier use.  No CPAP use no nebulizer machine use.  He does use a steam iron but there is no mold or mildew in it.  There is no [place] inside the house no pet birds or parakeets.  No pet gerbils no feather pillows no mold in the [facility] duct no music habits.  He does do some gardening very small garden in the house limited gardening mostly just water the garden.  No bird feather exposure no flood damage no strong mats no hot tub or Jacuzzi.  No exposure to animals at work.   OCCUPATIONAL HISTORY (122 questions) :   -Organic antigen exposure is negative inorganic exposure is negative   PULMONARY TOXICITY HISTORY (27 items):  -18-day prednisone course that ended last week.  He did have seen implantation for prostate cancer in October 2019 these were radiation seeds.      Results for Jeff, Willis (MRN [id number]) as of 11/15/2019 16:27  Ref. Range 09/07/2019 11:29 10/05/2019 11:25  Anti Nuclear Antibody (ANA) Latest Ref Range: NEGATIVE   POSITIVE (A)  ANA Pattern 1 Unknown  Nuclear, Speckled (A)  ANA Titer 1 Latest Units: titer  1:40 (H)  Angiotensin-Converting Enzyme Latest Ref Range: 9 - 67 U/L  34  Cyclic Citrullin Peptide Ab Latest Units: UNITS  <16  ds DNA Ab Latest Units: IU/mL  <1  Myeloperoxidase Abs Latest Units: AI  <1.0  Serine Protease 3 Latest Units: AI  <1.0  RA Latex Turbid. Latest Ref Range: <14 IU/mL  <14   IgE (Immunoglobulin E), Serum Latest Ref Range: <OR=114 kU/L 36   SSA (Ro) (ENA) Antibody, IgG Latest Ref Range: <1.0 NEG AI  <1.0 NEG  SSB (La) (ENA) Antibody, IgG Latest Ref Range: <1.0 NEG AI  <1.0 NEG  Scleroderma (Scl-70) (ENA) Antibody, IgG Latest Ref Range: <1.0 NEG AI  <1.0 NEG     Simple office walk 185 feet x  3 laps goal with forehead probe 10/05/2019   O2 used ra  Number laps completed 3  Comments about pace avt  Resting Pulse Ox/HR 100% and 122/min  Final Pulse Ox/HR 100% and 143/min  Desaturated </= 88% x  Desaturated <= 3% points x  Got Tachycardic >/= 90/min x  Symptoms at end of test x  Miscellaneous comments x    Results for FINNIAN, HUSTED (MRN [id number]) as of 10/05/2019 10:40  Ref. Range 09/07/2019 11:29  Eosinophils Absolute Latest Ref Range: 0.0 - 0.7 Willis/uL 0.1     High-resolution CT chest Sep 15, 2019: Personally visualized and agree with the findings below IMPRESSION: 1. Spectrum of findings compatible with basilar predominant fibrotic interstitial lung disease with suggestion of early honeycombing. 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 Kathleen, Iss 5, [phone], Dec 28 2016. 2. Ectatic 4.4 cm ascending thoracic aorta. Recommend annual imaging followup by CTA or MRA. This recommendation follows 2010 ACCF/AHA/AATS/ACR/ASA/SCA/SCAI/SIR/STS/SVM Guidelines for the Diagnosis and Management of Patients with Thoracic Aortic Disease. Circulation. 2010; 121: D638-V564. Aortic aneurysm NOS (ICD10-I71.9). 3. One vessel coronary atherosclerosis. 4. Aortic Atherosclerosis (ICD10-I70.0).     Electronically Signed  By: Ilona Sorrel M.D.   On: 09/15/2019 14:26     Results for Jeff, Willis (MRN [id number]) as of 10/05/2019 10:40  Ref. Range 09/07/2019 11:29  Sheep Sorrel IgE Latest Units: kU/L  <0.10  Pecan/Hickory Tree IgE Latest Units: kU/L <0.10  IgE (Immunoglobulin E), Serum Latest Ref Range: <OR=114 kU/L 36  Allergen, D pternoyssinus,d7 Latest Units: kU/L <0.10  Cat Dander Latest Units: kU/L <0.10  Dog Dander Latest Units: kU/L <0.10  [place] Grass Latest Units: kU/L <0.10  Johnson Grass Latest Units: kU/L <0.10  Timothy Grass Latest Units: kU/L <0.10  Cockroach Latest Units: kU/L <0.10  Aspergillus fumigatus, m3 Latest Units: kU/L <0.10  Allergen, Comm Silver Wendee Copp, t9 Latest Units: kU/L <0.10  Allergen, Cottonwood, t14 Latest Units: kU/L <0.10  Elm IgE Latest Units: kU/L <0.10  Allergen, Mulberry, t76 Latest Units: kU/L <0.10  Allergen, Oak,t7 Latest Units: kU/L <0.10  COMMON RAGWEED (SHORT) (W1) IGE Latest Units: kU/L 0.15 (H)  Allergen, Mouse Urine Protein, e78 Latest Units: kU/L <0.10  D. farinae Latest Units: kU/L <0.10  Allergen, Cedar tree, t12 Latest Units: kU/L <0.10  Box Elder IgE Latest Units: kU/L <0.10  Rough Pigweed  IgE Latest Units: kU/L <0.10    ROS - per HPI   OV 11/15/2019  Subjective:  [name], male , DOB: 1940-06-15 , age 63 y.o. , MRN: [id number] , ADDRESS: [place] [id number]   11/15/2019 -   Chief Complaint  Patient presents with   Follow-up    Pt states the cough has been better since last visit.      HPI Jeff Willis 81 y.o. -presents with his son Jeff Willis for follow-up.  He is here to discuss test results.  In the interim he called for worsening cough.  We gave him prednisone.  He tells me the prednisone actually made the cough worse particularly towards the end.  After that the son  doubled up his PPI and also added H2 blockade and the cough went away completely.  Patient feels completely normal that he is possible that he has interstitial lung disease.  We reviewed the scan and I visualized it with him together and his son.  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  They wanted to discuss the implications  of the scan in the setting of controlled acid reflux and controlled cough but he is largely asymptomatic at this point.  Noted his serologies were negative except for trace ANA and his overnight oxygen study was normal.     OV 01/17/2020   Subjective:  [name], male , DOB: [birth year], age 73 y.o. years. , MRN: [id number],  ADDRESS: [place] [address] PCP  Gaynelle Arabian, MD Providers : Treatment Team:  Attending Provider: Brand Males, MD   Chief Complaint  Patient presents with   Follow-up    pt is here to go over ct and pft    Follow-up interstitial lung disease Follow-up associated cough partially controlled with acid reflux treatment Associated weight loss present   HPI Belton G Gang 81 y.o. -presents with his son Jeff Willis.  In the interim he is continue to lose weight.  He has lost another 3-4 pounds since June 2021.  His clothes are much looser.  He thinks he has been steadily losing weight.  His respiratory symptoms are not any worse although he says that his cough that seem to initially improve with doubling up of acid reflux therapy seems to persist.  At last visit in September 2020 when I thought the cough resolved but they tell me the cough only partially improved.  Nevertheless it is persisting and they are worried about it.  In addition he is worried about weight loss.  He also has poor appetite.  He is not in any pulmonary fibrosis antifibrotic's.  There is no diarrhea.  Symptom score suggest some element of anxiety since his last visit.  He has had pulmonary function test and it shows his FVC is actually better but his DLCO is actually worse.  He had high-resolution CT chest that I visualized and reviewed with him.  It shows probable UIP with possible emerging honeycombing.  I agree with the radiologist.  The radiologist actually feels that it might be a little bit worse compared to few months ago.  Of note: He has  upcoming trip to [place] between March 05, 2020 and February 2022.  He wants to make sure his care is coordinated.  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 Evaluate for pulmonary fibrosis.   EXAM: CT CHEST WITHOUT CONTRAST   TECHNIQUE: Multidetector CT imaging of the chest was performed following the standard protocol without intravenous contrast. High resolution imaging of the lungs, as well as inspiratory and expiratory imaging, was performed.   COMPARISON:  Chest CT 09/15/2019.   FINDINGS: Cardiovascular: Heart size is normal. There is no significant pericardial fluid, thickening or pericardial calcification. There is aortic atherosclerosis, as well as atherosclerosis of the great vessels of the mediastinum and the coronary arteries, including calcified atherosclerotic plaque in the left main, left anterior descending and right coronary arteries. Dilatation of the ascending thoracic aorta measuring 4.5 cm in diameter.   Mediastinum/Nodes: No pathologically enlarged mediastinal or hilar lymph nodes. Please note that accurate exclusion of hilar adenopathy is limited on noncontrast CT scans. Esophagus is unremarkable in appearance. No axillary lymphadenopathy.   Lungs/Pleura: High-resolution images demonstrate widespread areas of ground-glass attenuation, septal thickening, thickening of the peribronchovascular interstitium, mild cylindrical bronchiectasis and peripheral bronchiolectasis. There is a suggestion of, but not definitive evidence of, early honeycombing in portions of the lungs. These findings do have a definitive craniocaudal gradient and appear slightly progressive compared to the prior study. Inspiratory and expiratory imaging is unremarkable. No acute consolidative airspace disease. No pleural effusions. No definite suspicious  appearing pulmonary nodules or masses are noted.   Upper Abdomen: Cortical calcification in the upper pole of the right kidney incidentally noted.   Musculoskeletal: There are no aggressive appearing lytic or blastic lesions noted in the visualized portions of the skeleton.   IMPRESSION: 1. The appearance of the lungs is compatible with progressive interstitial lung disease categorized as probable usual interstitial pneumonia (UIP) per current ATS guidelines. 2. Aortic atherosclerosis, in addition to left main and 2 vessel coronary artery disease. In addition, there is mild aneurysmal dilatation of the ascending thoracic aorta (4.5 cm in diameter). Ascending thoracic aortic aneurysm. Recommend semi-annual imaging followup by CTA or MRA and referral to cardiothoracic surgery if not already obtained. This recommendation follows 2010 ACCF/AHA/AATS/ACR/ASA/SCA/SCAI/SIR/STS/SVM Guidelines for the Diagnosis and Management of Patients With Thoracic Aortic Disease. Circulation. 2010; [id number]. Aortic aneurysm NOS (ICD10-I71.9).   Aortic Atherosclerosis (ICD10-I70.0). Aortic aneurysm NOS (ICD10-I71.9).     Electronically Signed   By: Vinnie Langton M.D.   On: 01/13/2020 15:15  ROS - per HPI    OV 03/01/2020  Subjective:  [name], male , DOB: [birth year] , age 37 y.o. , MRN: [id number] , ADDRESS: [address] [place] [address] PCP Gaynelle Arabian, MD Patient Care Team: Gaynelle Arabian, MD as PCP - General (Family Medicine)  This Provider for this visit: Treatment Team:  Attending Provider: Brand Males, MD  FU IPF Associated weight loss + pre-ofev   03/01/2020 -   Chief Complaint  Patient presents with   Follow-up    ILD, doing ok, cough has not improved     HPI Jeff Willis 81 y.o. -returns for follow-up.  This is after starting nintedanib.  He is currently on 100 mg twice daily for 2 weeks.  His prescription is  approved on February 02, 2020.  He accidentally got 150 mg twice daily but he sent this back.  He is going to send [place] back.  He has upcoming travel to [place] later this week he will be gone there for till February 2022.  He has supply and refill of his nintedanib on 06 March 2020.  After that he does not have nintedanib.  He and his son report that somebody will be able to go to [place] and deliver it to him.  They do not want to trust the courier service to take it there the supply chain issues.  At this point time from a respiratory standpoint he has minimal dyspnea.  He still has a cough.  The cough is not worse but after reducing the PPI dose the cough has come back.  It is somewhat troublesome.  The severity score is listed below.  They report the GI Dr. Collene Willis has reported concern about taking very high-dose of PPI and H2 blockade which actually helped the cough.  Is because of osteoporosis risk.  I agreed with that concern.  However patient is also reporting significant symptom burden.  Primary care has suggested that they follow the symptom control and quality of life issues.  I did support the primary care viewpoint as well.  Did explain to them at the end of the day as a risk-benefit ratio and with an advanced disease like pulmonary fibrosis 1 might have to consider taking a short-term again with cough control at the expense of long-term risk of side effects.  They are going to think about this.  In terms of his travel to [place].  He put me in touch with Dr. Posey Pronto.  I texted him and establish contact.  He will follow with Dr. Posey Pronto who is a pulmonologist trained in [place] but now living in [place].  Gave other travel advice are listed below.  In terms of weight loss: This is ongoing.  Even after starting the Ofev is not having any GI side effects but his weight loss continues.  He had a CT abdomen that is not showing any reason for weight loss.  Apparently had some hematuria had cystoscopy with Dr. Junious Silk.  He  has some radiation seed implants.  There is no bladder cancer.  The really frustrated by this.  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   Prior cough in  2019 for several months  resolved on on some kind of powder inhaler x one week = Advair     History of Present Illness  08/04/2019  Pulmonary/ 1st office eval/Jeff Willis  Chief Complaint  Patient presents with   Pulmonary Consult    Referred by Dr Jeff Willis. Pt c/o non prod cough and throat clearing x 6 months.   Dyspnea:  None/ no aerobics, walking neighborhoods ok  Cough: always dry/ any candy helps if keeps in mouth but esp hard candy / does not use cough drops  Sleep: rotates on side, bed is flat/ one pillow, never wakes up cough but /win 15 min of stirring recurs daily  SABA use: never, also never prednisone Call Dr Jeff Willis office and ask what inhaler you received in 2019 as sample that seemed to help your cough = advair dpi Stop cetrizine, flonase  Prednisone 10 mg take  4 each am x 2 days,   2 each am x 2 days,  1 each am x 2 days and stop  Pantoprazole (protonix) 40 mg   Take  30-60 min before first meal of the day and Pepcid (famotidine)  20 mg one after supper  until return to office  GERD   Gabapentin 100 mg  four times daily (ok to stop when no coughing at all for a week)  Please schedule a follow up office visit in 4 weeks, sooner if needed  with all medications /inhalers/ solutions in hand so we can verify exactly what you are taking. This includes all medications from all doctors and over the counters    09/06/2019  f/u ov/Jeff Willis re: uacs on gabapenitn 100 mg three times  wolicki had tried gabapentin but failed to reveal that at last ov when I recommended it  Chief Complaint  Patient presents with   Follow-up    4 wk f/u. Wants to discuss switching Advair.   Dyspnea:  No  Cough: better just while on prednisone maybe 50-80%   Sleeping: no noct cough SABA use: none 02: none  rec Gabapentin 200 mg up to 4 x daily  Pantoprazole (protonix) 40 mg   Take  30-60 min before first meal of the day and Pepcid (famotidine)  20 mg one @  bedtime until return to office - this is the best way to tell whether stomach acid is contributing to your problem.   Keep the candy handy  Prednisone 10 mg  Take 4 for three days 3 for three days 2 for three days 1 for three days and stop  If not better in 2 weeks see Dr Jeff Willis for your reflux  Please remember to go to the lab department and xray dept  for your tests - we will call you with the results when they are available.         OV 10/05/2019  Subjective:  [name], male , DOB: [birth year] ,  age 29 y.o. , MRN: [id number] , ADDRESS: [place] [address]   10/05/2019 -   Chief Complaint  Patient presents with   Follow-up    Pt states he has been doing okay since last visit and denies any complaints.   -81 year old [place] male.  Referred for interstitial lung disease.  He is accompanied by his son Jeff Willis who is a Software engineer.  I know Jeff Willis  from a few years ago when heused to accompany his aunt; asthma patient of mine. Jeff Willis  has has been referred by Dr. Christinia Willis to the ILD center.  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  Up until the pandemic used to travel quite frequently.  He is to go to [place] for 3 months.  At none of the properties he is being exposed to mold.  He developed insidious onset of chronic cough over a year ago this has been persistent.  In the last few months several cough related treatments have been tried with some improvement.  Has been on gabapentin but this made him sleepy and is currently stopped it.  Because of chronic cough which he only attributes his constant clearing of the throat he had a high-resolution CT scan of the chest that I personally visualized and shows probable UIP pattern and therefore he has been referred here.  In my personal visualization it might even be definite of UIP because there might be some early honeycombing there.  He denies any shortness of breath.  At rest he was found to be tachycardic but he says this can at baseline for him.  He will follow this up with his primary care physician.  [facility]( ILD Questionnaire  Symptoms:   He denies any shortness of breath.  No episodic shortness of breath.  However on the questioning he does report mild shortness of breath climbing up stairs.  In terms of his cough it started in June 2020.  It is the same since it started maybe in the last week it is slightly better.  Is moderate in intensity.  He does clear the throat.  Mostly dry cough occasionally white sputum.  Does feel a tickle in the back of his throat.  He is not waking up in the middle of the night.  Occasionally affects his voice.  No nausea no vomiting no diarrhea no wheezing.  SYMPTOM SCALE - ILD 10/05/2019   O2 use ra  Shortness of Breath 0 -> 5 scale with 5 being worst (score 6 If unable to do)  At rest 0  Simple tasks - showers, clothes change, eating, shaving 0  Household (dishes, doing bed, laundry) 0  Shopping 0  Walking level at own pace 0  Walking up Stairs 1  Total (30-36) Dyspnea Score  1  How bad is your cough? 2  How bad is your fatigue 0  How bad is nausea 0  How bad is vomiting?  00  How bad is diarrhea? 0  How bad is anxiety? 00  How bad is depression 0      Past Medical History : Denies any asthma COPD or heart failure rheumatoid arthritis or scleroderma or lupus or polymyositis or Sjogren's.  Denies sleep apnea.  Denies HIV denies pulmonary hypertension.  Denies diabetes or thyroid disease or stroke or seizures mononucleosis.  Denies any hepatitis or tuberculosis or kidney disease or pneumonia or blood clots or heart disease or pleurisy.  Does have on and off  acid reflux for the last year   ROS: Positive for dry mouth for the last several months.  He also has some dysphagia for swallowing pills unclear for what duration.  He also has acid reflux on PPI and H2 blocker.  Denies any Raynaud's or recurrent fever or weight loss.  No nausea no vomiting.   FAMILY HISTORY of LUNG DISEASE: * -Denies family history of COPD or asthma sarcoid or cystic fibrosis of hypersensitive pneumonitis or autoimmune disease.   EXPOSURE HISTORY: Denies any Covid.  Denies smoking cigarettes.  Denies marijuana denies smoking.  Denies IV drug use denies cocaine   HOME and HOBBY DETAILS : Single-family home [place] for the last 10 years.  Age of the home is 15 years.  No dampness.  No mildew.  No humidifier use.  No CPAP use no nebulizer machine use.  He does use a steam iron but there is no mold or mildew in it.  There is no [place] inside the house no pet birds or parakeets.  No pet gerbils no feather pillows no mold in the [facility] duct no music habits.  He does do some gardening very small garden in the house limited gardening mostly just water the garden.  No bird feather exposure no flood damage no strong mats no hot tub or Jacuzzi.  No exposure to animals at work.   OCCUPATIONAL HISTORY (122 questions) :   -Organic antigen exposure is negative inorganic exposure is  negative   PULMONARY TOXICITY HISTORY (27 items):  -18-day prednisone course that ended last week.  He did have seen implantation for prostate cancer in October 2019 these were radiation seeds.      Results for Jeff, Willis (MRN [id number]) as of 11/15/2019 16:27  Ref. Range 09/07/2019 11:29 10/05/2019 11:25  Anti Nuclear Antibody (ANA) Latest Ref Range: NEGATIVE   POSITIVE (A)  ANA Pattern 1 Unknown  Nuclear, Speckled (A)  ANA Titer 1 Latest Units: titer  1:40 (H)  Angiotensin-Converting Enzyme Latest Ref Range: 9 - 67 U/L  34  Cyclic Citrullin Peptide Ab Latest Units: UNITS  <16  ds DNA Ab Latest Units: IU/mL  <1  Myeloperoxidase Abs Latest Units: AI  <1.0  Serine Protease 3 Latest Units: AI  <1.0  RA Latex Turbid. Latest Ref Range: <14 IU/mL  <14  IgE (Immunoglobulin E), Serum Latest Ref Range: <OR=114 kU/L 36   SSA (Ro) (ENA) Antibody, IgG Latest Ref Range: <1.0 NEG AI  <1.0 NEG  SSB (La) (ENA) Antibody, IgG Latest Ref Range: <1.0 NEG AI  <1.0 NEG  Scleroderma (Scl-70) (ENA) Antibody, IgG Latest Ref Range: <1.0 NEG AI  <1.0 NEG     Simple office walk 185 feet x  3 laps goal with forehead probe 10/05/2019   O2 used ra  Number laps completed 3  Comments about pace avt  Resting Pulse Ox/HR 100% and 122/min  Final Pulse Ox/HR 100% and 143/min  Desaturated </= 88% x  Desaturated <= 3% points x  Got Tachycardic >/= 90/min x  Symptoms at end of test x  Miscellaneous comments x    Results for Jeff, Willis (MRN [id number]) as of 10/05/2019 10:40  Ref. Range 09/07/2019 11:29  Eosinophils Absolute Latest Ref Range: 0.0 - 0.7 Willis/uL 0.1     High-resolution CT chest Sep 15, 2019: Personally visualized and agree with the findings below IMPRESSION: 1. Spectrum of findings compatible with basilar predominant fibrotic interstitial lung disease with suggestion of early honeycombing. Findings are  categorized as probable UIP per consensus guidelines: Diagnosis of Idiopathic  Pulmonary Fibrosis: An Official ATS/ERS/JRS/ALAT Clinical Practice Guideline. [place], Iss 5, [id number], Dec 28 2016. 2. Ectatic 4.4 cm ascending thoracic aorta. Recommend annual imaging followup by CTA or MRA. This recommendation follows 2010 ACCF/AHA/AATS/ACR/ASA/SCA/SCAI/SIR/STS/SVM Guidelines for the Diagnosis and Management of Patients with Thoracic Aortic Disease. Circulation. 2010; [id number]. Aortic aneurysm NOS (ICD10-I71.9). 3. One vessel coronary atherosclerosis. 4. Aortic Atherosclerosis (ICD10-I70.0).     Electronically Signed   By: Ilona Sorrel M.D.   On: 09/15/2019 14:26     Results for Jeff, Willis (MRN [id number]) as of 10/05/2019 10:40  Ref. Range 09/07/2019 11:29  Sheep Sorrel IgE Latest Units: kU/L <0.10  Pecan/Hickory Tree IgE Latest Units: kU/L <0.10  IgE (Immunoglobulin E), Serum Latest Ref Range: <OR=114 kU/L 36  Allergen, D pternoyssinus,d7 Latest Units: kU/L <0.10  Cat Dander Latest Units: kU/L <0.10  Dog Dander Latest Units: kU/L <0.10  [place] Grass Latest Units: kU/L <0.10  Johnson Grass Latest Units: kU/L <0.10  Timothy Grass Latest Units: kU/L <0.10  Cockroach Latest Units: kU/L <0.10  Aspergillus fumigatus, m3 Latest Units: kU/L <0.10  Allergen, Comm Silver Wendee Copp, t9 Latest Units: kU/L <0.10  Allergen, Cottonwood, t14 Latest Units: kU/L <0.10  Elm IgE Latest Units: kU/L <0.10  Allergen, Mulberry, t76 Latest Units: kU/L <0.10  Allergen, Oak,t7 Latest Units: kU/L <0.10  COMMON RAGWEED (SHORT) (W1) IGE Latest Units: kU/L 0.15 (H)  Allergen, Mouse Urine Protein, e78 Latest Units: kU/L <0.10  D. farinae Latest Units: kU/L <0.10  Allergen, Cedar tree, t12 Latest Units: kU/L <0.10  Box Elder IgE Latest Units: kU/L <0.10  Rough Pigweed  IgE Latest Units: kU/L <0.10    ROS - per HPI   OV 11/15/2019  Subjective:  [name], male , DOB: 05/28/1940 , age 5 y.o. , MRN: [id number] , ADDRESS:  [place] [id number]   11/15/2019 -   Chief Complaint  Patient presents with   Follow-up    Pt states the cough has been better since last visit.      HPI Geronimo 81 y.o. -presents with his son Jeff Willis for follow-up.  He is here to discuss test results.  In the interim he called for worsening cough.  We gave him prednisone.  He tells me the prednisone actually made the cough worse particularly towards the end.  After that the son  doubled up his PPI and also added H2 blockade and the cough went away completely.  Patient feels completely normal that he is possible that he has interstitial lung disease.  We reviewed the scan and I visualized it with him together and his son.  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  They wanted to discuss the implications of the scan in the setting of controlled acid reflux and controlled cough but he is largely asymptomatic at this point.  Noted his serologies were negative except for trace ANA and his overnight oxygen study was normal.     OV 01/17/2020   Subjective:  [name], male , DOB: 1940-11-28, age 79 y.o. years. , MRN: [id number],  ADDRESS: [place] [address] PCP  Gaynelle Arabian, MD Providers : Treatment Team:  Attending Provider: Brand Males, MD   Chief Complaint  Patient presents with   Follow-up    pt is here to go over ct and pft    Follow-up interstitial lung disease Follow-up  associated cough partially controlled with acid reflux treatment Associated weight loss present   HPI Jeff Willis 81 y.o. -presents with his son Jeff Willis.  In the interim he is continue to lose weight.  He has lost another 3-4 pounds since June 2021.  His clothes are much looser.  He thinks he has been steadily losing weight.  His respiratory symptoms are not any worse although he says that his cough that seem to initially improve with doubling up of acid reflux therapy seems to persist.  At last  visit in September 2020 when I thought the cough resolved but they tell me the cough only partially improved.  Nevertheless it is persisting and they are worried about it.  In addition he is worried about weight loss.  He also has poor appetite.  He is not in any pulmonary fibrosis antifibrotic's.  There is no diarrhea.  Symptom score suggest some element of anxiety since his last visit.  He has had pulmonary function test and it shows his FVC is actually better but his DLCO is actually worse.  He had high-resolution CT chest that I visualized and reviewed with him.  It shows probable UIP with possible emerging honeycombing.  I agree with the radiologist.  The radiologist actually feels that it might be a little bit worse compared to few months ago.  Of note: He has upcoming trip to [place] between March 05, 2020 and February 2022.  He wants to make sure his care is coordinated.  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 Evaluate for pulmonary fibrosis.   EXAM: CT CHEST WITHOUT CONTRAST   TECHNIQUE: Multidetector CT imaging of the chest was performed following the standard protocol without intravenous contrast. High resolution imaging of the lungs, as well as inspiratory and expiratory imaging, was performed.   COMPARISON:  Chest CT 09/15/2019.   FINDINGS: Cardiovascular: Heart size is normal. There is no significant pericardial fluid, thickening or pericardial calcification. There is aortic atherosclerosis, as well as atherosclerosis of the great vessels of the mediastinum and the coronary arteries, including calcified atherosclerotic plaque in the left main, left anterior descending and right coronary arteries. Dilatation of the ascending thoracic aorta measuring 4.5 cm in diameter.   Mediastinum/Nodes: No pathologically enlarged mediastinal or  hilar lymph nodes. Please note that accurate exclusion of hilar adenopathy is limited on noncontrast CT scans. Esophagus is unremarkable in appearance. No axillary lymphadenopathy.   Lungs/Pleura: High-resolution images demonstrate widespread areas of ground-glass attenuation, septal thickening, thickening of the peribronchovascular interstitium, mild cylindrical bronchiectasis and peripheral bronchiolectasis. There is a suggestion of, but not definitive evidence of, early honeycombing in portions of the lungs. These findings do have a definitive craniocaudal gradient and appear slightly progressive compared to the prior study. Inspiratory and expiratory imaging is unremarkable. No acute consolidative airspace disease. No pleural effusions. No definite suspicious appearing pulmonary nodules or masses are noted.   Upper Abdomen: Cortical calcification in the upper pole of the right kidney incidentally noted.   Musculoskeletal: There are no aggressive appearing lytic or blastic lesions noted in the visualized portions of the skeleton.   IMPRESSION: 1. The appearance of the lungs is compatible with progressive interstitial lung disease categorized as probable usual interstitial pneumonia (UIP) per current ATS guidelines. 2. Aortic atherosclerosis, in addition to left main and 2 vessel coronary artery disease. In addition,  there is mild aneurysmal dilatation of the ascending thoracic aorta (4.5 cm in diameter). Ascending thoracic aortic aneurysm. Recommend semi-annual imaging followup by CTA or MRA and referral to cardiothoracic surgery if not already obtained. This recommendation follows 2010 ACCF/AHA/AATS/ACR/ASA/SCA/SCAI/SIR/STS/SVM Guidelines for the Diagnosis and Management of Patients With Thoracic Aortic Disease. Circulation. 2010; [id number]. Aortic aneurysm NOS (ICD10-I71.9).   Aortic Atherosclerosis (ICD10-I70.0). Aortic aneurysm NOS (ICD10-I71.9).      Electronically Signed   By: Vinnie Langton M.D.   On: 01/13/2020 15:15    ROS - per HPI    OV 03/01/2020  Subjective:  [name], male , DOB: January 21, 1941 , age 27 y.o. , MRN: [id number] , ADDRESS: [address] [place] [address] PCP Gaynelle Arabian, MD Patient Care Team: Gaynelle Arabian, MD as PCP - General (Family Medicine)  This Provider for this visit: Treatment Team:  Attending Provider: Brand Males, MD  FU IPF Associated weight loss + pre-ofev   03/01/2020 -   Chief Complaint  Patient presents with   Follow-up    ILD, doing ok, cough has not improved     HPI Jeff Willis 81 y.o. -returns for follow-up.  This is after starting nintedanib.  He is currently on 100 mg twice daily for 2 weeks.  His prescription is approved on February 02, 2020.  He accidentally got 150 mg twice daily but he sent this back.  He is going to send [place] back.  He has upcoming travel to [place] later this week he will be gone there for till February 2022.  He has supply and refill of his nintedanib on 06 March 2020.  After that he does not have nintedanib.  He and his son report that somebody will be able to go to [place] and deliver it to him.  They do not want to trust the courier service to take it there the supply chain issues.  At this point time from a respiratory standpoint he has minimal dyspnea.  He still has a cough.  The cough is not worse but after reducing the PPI dose the cough has come back.  It is somewhat troublesome.  The severity score is listed below.  They report the GI Dr. Collene Willis has reported concern about taking very high-dose of PPI and H2 blockade which actually helped the cough.  Is because of osteoporosis risk.  I agreed with that concern.  However patient is also reporting significant symptom burden.  Primary care has suggested that they follow the symptom control and quality of life issues.  I did support the primary care viewpoint as well.   Did explain to them at the end of the day as a risk-benefit ratio and with an advanced disease like pulmonary fibrosis 1 might have to consider taking a short-term again with cough control at the expense of long-term risk of side effects.  They are going to think about this.  In terms of his travel to [place].  He put me in touch with Dr. Posey Pronto.  I texted him and establish contact.  He will follow with Dr. Posey Pronto who is a pulmonologist trained in [place] but now living in [place].  Gave other travel advice are listed below.  In terms of weight loss: This is ongoing.  Even after starting the Ofev is not having any GI side effects but his weight loss continues.  He had a CT abdomen that is not showing any reason for weight loss.  Apparently had some hematuria had  cystoscopy with Dr. Junious Silk.  He has some radiation seed implants.  There is no bladder cancer.  The really frustrated by this.  I did pull out an article about IPF and weight loss for the son to read.- shows IPF weight loss is asssocited with worse progrnosis    ROS - per HPI    OV 07/24/2020  Subjective:  [name], male , DOB: 1940/11/05 , age 5 y.o. , MRN: [id number] , ADDRESS: [address] [place] [address] PCP Gaynelle Arabian, MD Patient Care Team: Gaynelle Arabian, MD as PCP - General (Family Medicine)  This Provider for this visit: Treatment Team:  Attending Provider: Brand Males, MD    07/24/2020 -   Chief Complaint  Patient presents with   Follow-up    4 mo f/u. States his breathing has been stable since last visit. Has noticed a decrease in his appetite. Increased diarrhea for the past week.    Idiopathic pulmonary fibrosis on nintedanib 100 mg twice daily -last CT September 2021 Weight loss unintentional even before nintedanib  HPI Jeff Willis 80 y.o. -returns for follow-up.  He was in [place] and returned back in early March 2022.  He is now here with his wife.  He tells me while  he was in [place] for 4 months he had a great time.  He barely had any diarrhea.  Approximately 2 months ago which is 1 month before his return he switched to an [place] version of nintedanib called NINTIB.  He was taking it for a month in [place] and did not have any problems.  Upon returning to the [place] he is starting to have diarrhea.  The diarrhea is ongoing and is 3 out of 5.  Happens 2 or 3 times a week and controlled by Imodium.  His son who is a Software engineer thinks it is because he is in the [place] version of nintedanib.  He says he spoke to the [place] pulmonologist and was told that exponentially there is no difference in diarrhea between the [place] version of nintedanib and the [place] version of nintedanib.  Diarrhea happens randomly does bother him.  He says his diet has not changed since returning to the [place].  He says he eats the same Oblong.  He does take sugar with tea in the morning.  In terms of his tachycardia it still is ongoing.  He is never had echocardiogram.  In terms of weight loss he continues to lose weight.  This weight loss started even before he was on nintedanib.  It is still ongoing.  He is lost 10 pounds since June 2021.  He is worried about this.  PFT  OV 09/22/2020  Subjective:  [name], male , DOB: 20-May-1940 , age 54 y.o. , MRN: [id number] , ADDRESS: [address] [place] [address] PCP Gaynelle Arabian, MD Patient Care Team: Gaynelle Arabian, MD as PCP - General (Family Medicine)  This Provider for this visit: Treatment Team:  Attending Provider: Brand Males, MD    09/22/2020 -   Chief Complaint  Patient presents with   Follow-up    2 mo f/u after PFT. States he is still struggling with diarrhea and losing weight. States his breathing has been stable since last visit.    Idiopathic pulmonary fibrosis on nintedanib 100 mg twice daily since Oct 2021   -last CT September 2021 Weight loss unintentional  even before nintedanib Cough Tachycardia NOS  HPI  Jeff Willis 81 y.o. -returns for follow-up of all the above medical issues.  From a respiratory standpoint he continues to be stable.  He had pulmonary function test that shows continued stability/improvement.  His cough continues.  He in fact he tells me that for the last 2 or 3 years mostly when he drinks water or eats food he starts choking and coughing and clears his throat.  He has now seen ENT and the dysphagia evaluation is set up.  He is continue to lose weight.  In fact he is lost 10 pounds of weight since starting nintedanib.  The weight loss was the even before nintedanib but has accelerated after nintedanib.  He has diarrhea with the nintedanib.  This is on low-dose nintedanib.  Currently is on the [place] version of the nintedanib but he still losing weight and having the same GI symptoms.  In terms of his tachycardia he had echocardiogram and this is normal.  He says his wife is very concerned about his weight loss.  His pants are loose now.      ECHO MAY 2022   IMPRESSIONS     1. Left ventricular ejection fraction, by estimation, is 60 to 65%. The  left ventricle has normal function. The left ventricle has no regional  wall motion abnormalities. Left ventricular diastolic parameters are  indeterminate.   2. Right ventricular systolic function is normal. The right ventricular  size is normal. There is mildly elevated pulmonary artery systolic  pressure. The estimated right ventricular systolic pressure is [id number] mmHg.   3. The mitral valve is normal in structure. No evidence of mitral valve  regurgitation.   4. Tricuspid valve regurgitation is mild to moderate.   5. The aortic valve is tricuspid. Aortic valve regurgitation is not  visualized. No aortic stenosis is present.   6. The inferior vena cava is normal in size with greater than 50%  respiratory variability, suggesting right atrial pressure of 3 mmHg.      OV 01/11/2021  Subjective:  [name], male , DOB: 28-Nov-1940 , age 59 y.o. , MRN: [id number] , ADDRESS: [place] [id number] PCP Gaynelle Arabian, MD Patient Care Team: Gaynelle Arabian, MD as PCP - General (Family Medicine)  This Provider for this visit: Treatment Team:  Attending Provider: Brand Males, MD Type of visit: Telephone/Video Circumstance: COVID-19 national emergency Identification of patient LAITHEN ADIE with [birth year] and MRN [id number] - 2 person identifier Risks: Risks, benefits, limitations of telephone visit explained. Patient understood and verbalized agreement to proceed Anyone else on call: no one other than patient and MD Patient location: patient home This provider location: [address]; [place], [place], [place]   01/11/2021 -  IPF followup   Idiopathic pulmonary fibrosis on nintedanib 100 mg twice daily since Oct 2021   -last CT September 2021 Weight loss unintentional even before nintedanib Cough Tachycardia NOS  HPI Jeff Willis 80 y.o. - off ofev since may 2022 Gained weight 5#/ Minimal dyspnea to no dyspnea even with exertion. [place] in interim and had good time. Did water slide. Cough is a bit worse. Othewrise well. Enegery level goodl Though cough is mild - wants something for cough relief thought says medicine from [place] helping.  Plans to go to [place] on February 21, 2021    OV 02/06/2021  Subjective:  Patient ID: ARNEY DRACE, male , DOB: [birth year] , age 38 y.o. , MRN: [id number] , ADDRESS:  [address] [place] [id number] PCP Gaynelle Arabian, MD Patient Care Team: Gaynelle Arabian, MD as PCP - General (Family Medicine)  This Provider for this visit: Treatment Team:  Attending Provider: Brand Males, MD    02/06/2021 -   Chief Complaint  Patient presents with   Follow-up    Esbriet follow up, cough in the morning    Idiopathic  pulmonary fibrosis on nintedanib 100 mg twice daily since Oct 2021   -last CT September 2022, pft oct 2022 -> progression on CT scan  -Stop nintedanib September 2022 due to weight loss and intolerance  -Started pirfenidone October 2022 Weight loss unintentional even before nintedanib  -Improved September/October 2022 after stopping nintedanib Cough and Dysphagia  -Normal endoscopy with Dr. Collene Willis September 2021/October 2021  -Abnormal barium study September 2022  HPI Jeff G Kreamer 81 y.o. -returns for follow-up.  This is a head of his trip to [place] February 21, 2021.  He will only be back after 6 months in March /April 2023.  He tells me overall he stable.  His symptom score is stable.  Very little to no dyspnea.  He continues to have dysphagia and his chronic cough.  A year ago his endoscopy was normal.  He saw ENT because of his cough they did a barium study.  As barium study was done end of September 2022 and the suggestion of cervical web on the barium study.  He asked me to call Dr. Collene Willis his GI.  I called her.  She said she will work him in.  He might need another endoscopy to rule out cervical web.  We discussed cervical web with the help of Google images.  Currently he is only bothered by cough especially early in the morning but this cough is stable as can be seen in the symptom score.  There is no dyspnea.  He came back from [place].  He plans to go to [place] February 21, 2021 and will stay there through March April 2023.  He is agreed for a telephone visit while in [place].  He has a pulmonologist in [place].  His walking desaturation test is stable except for mild tachycardia.  No shortness of breath.  He says he had liver function test through his primary care and this was normal.  He does not want to be checked today.    OV 09/07/2021  Subjective:  [name], male , DOB: [birth year] , age 38 y.o. , MRN: [id number] , ADDRESS: [place]  [id number] PCP Gaynelle Arabian, MD Patient Care Team: Gaynelle Arabian, MD as PCP - General (Family Medicine)  This Provider for this visit: Treatment Team:  Attending Provider: Brand Males, MD    09/07/2021 -   Chief Complaint  Patient presents with   Follow-up    PFT performed today.      HPI [place] 81 y.o. -returns for follow-up.  Last seen in October 2022.  Then in November 2023 went to [place] to [place].  He returned in March 2023.  While in [place] he says overall he did not have any problems.  He was continuing his pirfenidone.  However he lost his weight.  He did then pause his pirfenidone for 3 weeks and his weight improved.  Then after coming here he started losing weight again.  He could not figure out if it is because of dysphagia which had also started versus pirfenidone.  He then has reduced his pirfenidone to 2  pills 3 times daily.  At this point in time is not interested in clinical trials.  He says he wants to avoid many medications.  Simultaneously has been dealing with dysphagia particular with solids and spices.  Also hard food.  He is clearing his throat quite frequently although he still rates it as level 1 out of 5.  While in [place] May 04, 2021 he had PET scan.  He showed me the images of the PET scan.  We can upload the CD-ROM in the PACS system.  In the entire left lower lobe there is slightly increased uptake for 2.7.  This is consistent with the fibrotic tissue there is also low uptake in the mediastinal adenopathy which is considered to be reactive.  He did not want any quick follow-up of his CT scan.  He is okay to have a CT scan in September 2023.  He wants avoid radiation exposure.   On August 10, 2021 at [facility] long Dr. Carlean Jews performed status post dilatation of esophageal stenosis.  I reviewed the records.  Of note: He plans to go to [place] in October 2023.  He wants his next follow-up in September 23.  In August 2023 is going with a lot of  people to [place]       PFT      [place] 02/25/2022  Subjective:  [name], male , DOB: December 06, 1940 , age 55 y.o. , MRN: [id number] , ADDRESS: [place] [id number] PCP Gaynelle Arabian, MD Patient Care Team: Gaynelle Arabian, MD as PCP - General (Family Medicine)  This Provider for this visit: Treatment Team:  Attending Provider: Brand Males, MD    02/25/2022 -   Chief Complaint  Patient presents with   Follow-up    PFT performed today.  Pt states he is slowly getting better after recent hospital stay.    Idiopathic pulmonary fibrosis on nintedanib 100 mg twice daily since Oct 2021   -last CT September 2022, pft oct 2022 -> progression on CT scan  -Stop nintedanib September 2022 due to weight loss and intolerance  -Started pirfenidone October 2022 -> reduced to 2 pills 3 times daily March 2023 due to weight loss  IPF flareup due to COVID-19 summer 2023 followed by aspiration pneumonia and acute respiratory failure needing oxygen and intubation  -History of intubation  - Course complicated by aspiration  -On prednisone therapy since discharge  Weight loss unintentional even before nintedanib  -Improved September/October 2022 after stopping nintedanib  0-relapse while in [place] but after starting pirfenidone in the fall 2022 and early 2023.  -Progressive weight loss after summer 2023 hospitalization for COVID-19  Cough and Dysphagia ad clearing throat  -Normal endoscopy with Dr. Collene Willis September 2021/October 2021  -Abnormal barium study September 2022  -Status post endoscopy with esophageal stenosis dilatation by Dr. Benson Norway April 2023.  -Worse in October 2023 after some hospitalization for COVID   HPI Osamu G Leske 81 y.o. -returns for follow-up.  Over the summer 2023 got hospitalized for COVID-19 related respiratory failure and IPF flareup and ARDS.  He miraculously survived.  He then had a complication by aspiration for  which she did not get intubated according to the son.  He then spent time in inpatient rehab and is now home for the last 3 weeks.  At home he is continue to improve.  He is on a chronic prednisone taper with Bactrim.  At this point in time he is not needing oxygen at  rest based on home pulse oximetry monitoring.  Here we walked him 5 feet and back on room air and he did not desaturate below 94%.  Recent liver function test normal.  Anemia also improving.   He has had continued weight loss although he has gained weight since the hospitalization compared to prehospitalization this further weight loss to 114 pounds.  Some of this was pirfenidone mediated.  He is now back on the pirfenidone for the last few weeks.  They also feel that some of the weight loss is because he is not able to take enough food because of the dysphagia and the cough.  He continues to be physically deconditioned but even that he has improved.  He is able to do some ADLs.  He is able to take shower with the help of his wife.  He is able to walk within the house although still a little bit wobbly   He now has a chronic Foley which she is not able to get rid of after the hospitalization.  He follows with Dr. Junious Silk neurology.   The main issue right now is his cough and clearing of the throat.  The 2 are slightly unrelated.  He coughs when he eats food.  He also coughs when he clears her throat.  He throats clears his throat independent of the cough.  Eating food makes him clear the throat more. .  At night he does not clear the throat.  He has not seen ENT in 2 years.  There is no history of difficult intubation.  He and her son feel it is related to his dysphagia issues.  Mid November 2023 he is being scheduled for esophageal dilatation by Dr. Carol Ada.  I did tell them that the risk for endoscopy is acceptable given the fact he was able to walk 5-10 feet without any desaturation on room air ..  I supported him having the  endoscopy.  They believe there might be some sinus drainage.  Did encourage him to try over-the-counter Flonase and also Mucinex.   He did have some back pain in the hospital and was started on gabapentin.  Is clearly not helping his cough or clearing of the throat.  He is back pain is not much of an issue.  We took a shared decision making he can come off this.     SYMPTOM SCALE - ILD 10/05/2019 137 pounds 01/17/2020 134 pounds 03/01/2020 131# - start ofev oct 2021 07/24/2020 127# on ofev (nintib from [place] by [facility] x 2 months) '100mg'$  bid 09/22/2020 122# - on ofev '100mg'$  bid 01/11/2021 Tele visit - off ofev  128# not on any antifibrotic 02/06/2021 131# -esbriet x 1 week 09/07/2021 125#  02/25/2022 114#   O2 use ra ra ra ra ra  ra   Shortness of Breath 0 -> 5 scale with 5 being worst (score 6 If unable to do)         At rest 0 0 0 0 0  0   Simple tasks - showers, clothes change, eating, shaving 0 0 0 1 0  0 0  Household (dishes, doing bed, laundry) 0 1 1  0 0  0 1.5  Shopping 0 0 0 0   0 nan  Walking level at own pace 0 0 0 0 0  0 1  Walking up Stairs 1 0 1 0 0  0 2  Total (30-36) Dyspnea Score '1 1 2 1 '$ 0  0 3.5  How  bad is your cough? 2 3 0 '2 2  2 5  '$ How bad is your fatigue 0 0 0 0 0  0 4  How bad is nausea 0 0 0 1 0  0 0  How bad is vomiting?  00 0 0 0 0  0 0  How bad is diarrhea? 0 0 0 3 3  0 0  How bad is anxiety? 00 '1 1 1 '$ 0  1 1.5  How bad is depression 0 '1 1 1 '$ 0  1 1.5         Simple office walk 185 feet x  3 laps goal with forehead probe 10/05/2019  03/01/2020  07/24/2020  09/22/2020  02/06/2021  09/07/2021   O2 used ra ra  ra ra ra  Number laps completed 3   3    Comments about pace avt   Mod paced avg avg  Resting Pulse Ox/HR 100% and 122/min 98% and 110 1-00% and 120 100% and 109/min 99% and 89 100% and 85  Final Pulse Ox/HR 100% and 143/min 97% and 130 99% and 130 99% and 124/min 99% and 114 98% ad 115  Desaturated </= 88% x x  no no   Desaturated <= 3% points x x   no no   Got Tachycardic >/= 90/min x x  ye yes   Symptoms at end of test x x  none none   Miscellaneous comments x x No dyspnea No dyspnea No dyspnea No dyspnea    PFT     Latest Ref Rng & Units 02/25/2022    3:12 PM 09/07/2021   10:48 AM 02/05/2021   10:44 AM 09/22/2020    9:50 AM 01/17/2020   10:13 AM 10/06/2019    1:49 PM  PFT Results  FVC-Pre L 1.96  P 2.63  2.67  2.81   2.59   FVC-Predicted Pre % 63  P 84  85  89  83  78   Pre FEV1/FVC % % 89  P 85  87  87  86  86   FEV1-Pre L 1.75  P 2.24  2.33  2.45  2.37  2.24   FEV1-Predicted Pre % 74  P 95  97  102  95  89   DLCO uncorrected ml/min/mmHg 8.46  P 12.24  13.45  16.91  13.79  15.70   DLCO UNC% % 31  P 45  49  62  48  55   DLCO corrected ml/min/mmHg 9.22  P 12.24  13.45  16.91  13.79  15.70   DLCO COR %Predicted % 34  P 45  49  62  48  55   DLVA Predicted % 68  P 72  80  89  81  97     P Preliminary result       has a past medical history of Back pain, Elevated PSA, Essential tremor, Hypercholesteremia, Neuropathy, Prostate cancer ([place]), and Sinus tachycardia (01/30/2018).   reports that he has never smoked. He has never used smokeless tobacco.  Past Surgical History:  Procedure Laterality Date   COLONOSCOPY WITH PROPOFOL N/A 08/10/2021   Procedure: COLONOSCOPY WITH PROPOFOL;  Surgeon: Carol Ada, MD;  Location: [facility];  Service: Gastroenterology;  Laterality: N/A;   ESOPHAGEAL DILATION  08/10/2021   Procedure: ESOPHAGEAL DILATION;  Surgeon: Carol Ada, MD;  Location: [facility];  Service: Gastroenterology;;   ESOPHAGOGASTRODUODENOSCOPY (EGD) WITH PROPOFOL N/A 08/10/2021   Procedure: ESOPHAGOGASTRODUODENOSCOPY (EGD) WITH PROPOFOL;  Surgeon: Carol Ada,  MD;  Location: [facility];  Service: Gastroenterology;  Laterality: N/A;   HEMOSTASIS CLIP PLACEMENT  08/10/2021   Procedure: HEMOSTASIS CLIP PLACEMENT;  Surgeon: Carol Ada, MD;  Location: [facility];  Service: Gastroenterology;;   LUMBAR  MICRODISCECTOMY     L4-5   POLYPECTOMY  08/10/2021   Procedure: POLYPECTOMY;  Surgeon: Carol Ada, MD;  Location: [facility];  Service: Gastroenterology;;   RADIOACTIVE SEED IMPLANT N/A 02/24/2018   Procedure: RADIOACTIVE SEED IMPLANT/BRACHYTHERAPY IMPLANT;  Surgeon: Festus Aloe, MD;  [facility];  Service: Urology;  Laterality: N/A;   RIGHT HEART CATH N/A 11/22/2021   Procedure: RIGHT HEART CATH;  Surgeon: Troy Sine, MD;  Location: [facility];  Service: Cardiovascular;  Laterality: N/A;   SPACE OAR INSTILLATION N/A 02/24/2018   Procedure: SPACE OAR INSTILLATION;  Surgeon: Festus Aloe, MD;  Location: [facility];  Service: Urology;  Laterality: N/A;    Allergies  Allergen Reactions   Beef-Derived Products    Chicken Protein    Eggs Or Egg-Derived Products    Fish-Derived Products    Meat Extract     vegetarian   Ofev [Nintedanib] Other (See Comments)    Weight loss   Pork-Derived Products     Immunization History  Administered Date(s) Administered   Influenza, High Dose Seasonal PF 01/28/2019, 01/12/2021, 02/11/2022   Influenza-Unspecified 01/05/2020   PFIZER(Purple Top)SARS-COV-2 Vaccination 07/23/2019, 08/17/2019   Pneumococcal-Unspecified 02/28/2015   Zoster, Live 01/26/2021    Family History  Problem Relation Age of Onset   Other Mother        died at 64 of natural causes   Other Father        unsure of health   Cancer Neg Hx      Current Outpatient Medications:    aspirin EC 81 MG tablet, Take 81 mg by mouth daily. Swallow whole., Disp: , Rfl:    chlorpheniramine-HYDROcodone (TUSSIONEX) 10-8 MG/5ML, Take 5 mLs by mouth every 12 (twelve) hours as needed for cough., Disp: 300 mL, Rfl: 0   Continuous Blood Gluc Sensor (FREESTYLE LIBRE 3 SENSOR) MISC, 1 each by Does not apply route 4 (four) times daily -  before meals and at bedtime. Place 1 sensor on the skin every 14 days. Use to check glucose  continuously, Disp: 2 each, Rfl: 3   gabapentin (NEURONTIN) 250 MG/5ML solution, Take 2 mLs (100 mg total) by mouth 3 (three) times daily., Disp: 180 mL, Rfl: 0   glucose blood (FREESTYLE LITE) test strip, Use ACH/HS to check CBGs, Disp: 60 each, Rfl: 5   melatonin 3 MG TABS tablet, Take 1 tablet (3 mg total) by mouth at bedtime as needed., Disp: , Rfl: 0   metFORMIN (GLUCOPHAGE) 850 MG tablet, Take 1 tablet (850 mg total) by mouth 2 (two) times daily with a meal. (Patient taking differently: Take 850 mg by mouth daily with breakfast.), Disp: 60 tablet, Rfl: 0   metoprolol succinate (TOPROL-XL) 25 MG 24 hr tablet, TAKE ONE TABLET BY MOUTH DAILY, Disp: 90 tablet, Rfl: 0   Multiple Vitamin (MULTIVITAMIN WITH MINERALS) TABS tablet, Take 1 tablet by mouth daily., Disp: , Rfl:    pantoprazole (PROTONIX) 40 MG tablet, Take 1 tablet (40 mg total) by mouth 2 (two) times daily., Disp: , Rfl:    Pirfenidone (ESBRIET) 267 MG TABS, Take 2 tablets (534 mg total) by mouth in the morning, at noon, and at bedtime. **LOW DOSE AS MAINTENANCE** (Patient taking differently: Take 267 mg  by mouth in the morning, at noon, and at bedtime. **LOW DOSE AS MAINTENANCE**), Disp: 540 tablet, Rfl: 1   polyethylene glycol powder (GLYCOLAX/MIRALAX) 17 GM/SCOOP powder, Take 17 g by mouth 2 (two) times daily. (Patient taking differently: Take 17 g by mouth as needed.), Disp: 238 g, Rfl: 0   predniSONE (DELTASONE) 10 MG tablet, Take 4 tablets ('40mg'$ ) by mouth on 10/03. Decrease to 3 tabs ('30mg'$ ) daily 10/04 to 10/16, then 2 tabs ('20mg'$ ) daily 10/17-10/30, then decrease to 1 tab ('10mg'$ ) 10/31 to 11/13., Disp: 88 tablet, Rfl: 0   rosuvastatin (CRESTOR) 10 MG tablet, Take 10 mg by mouth at bedtime., Disp: , Rfl:    tamsulosin (FLOMAX) 0.4 MG CAPS capsule, Take 2 capsules (0.8 mg total) by mouth daily after supper., Disp: 60 capsule, Rfl: 0   Vitamin D, Ergocalciferol, (DRISDOL) 1.25 MG (50000 UNIT) CAPS capsule, Take 1 capsule (50,000 Units  total) by mouth every 7 (seven) days., Disp: 5 capsule, Rfl: 0   [START ON 03/12/2022] predniSONE (DELTASONE) 5 MG tablet, Beginning on 03/12/22, Take 1 tablet (5 mg total) by mouth daily with breakfast. (Patient not taking: Reported on 02/25/2022), Disp: 15 tablet, Rfl: 0   sulfamethoxazole-trimethoprim (BACTRIM DS) 800-160 MG tablet, Take 1 tablet by mouth 3 (three) times a week. (Patient not taking: Reported on 02/25/2022), Disp: 12 tablet, Rfl: 0      Objective:   Vitals:   02/25/22 1604  BP: 112/62  Pulse: 89  Temp: 98 F (36.7 C)  TempSrc: Oral  SpO2: 97%  Weight: 114 lb 12.8 oz (52.1 kg)  Height: '5\' 6"'$  (1.676 m)    Estimated body mass index is 18.53 kg/m as calculated from the following:   Height as of this encounter: '5\' 6"'$  (1.676 m).   Weight as of this encounter: 114 lb 12.8 oz (52.1 kg).  '@WEIGHTCHANGE'$ @  Filed Weights   02/25/22 1604  Weight: 114 lb 12.8 oz (52.1 kg)     Physical Exam General: No distress. Frail. Able to get off wheel chair and slowly walk 5 feet and back without issues but somewha obbly Neuro: Alert and Oriented x 3. GCS 15. Speech normal Psych: Pleasant Resp:  Barrel Chest - no.  Wheeze - no, Crackles -  YES HALF WAY, No overt respiratory distress CVS: Normal heart sounds. Murmurs - no Ext: Stigmata of Connective Tissue Disease - no HEENT: Normal upper airway. PEERL +. No post nasal drip        Assessment:       ICD-10-CM   1. IPF (idiopathic pulmonary fibrosis) (HCC)  S28.315 Pulmonary function test    2. History of 2019 novel coronavirus disease (COVID-19)  Z86.16     3. History of acute respiratory failure  Z87.09     4. Chronic cough  R05.3 Ambulatory referral to ENT    5. Esophageal dysphagia  R13.19 Ambulatory referral to ENT    6. Throat clearing  R09.89 Ambulatory referral to ENT    7. Encounter for medication counseling  Z71.89     8. Weight loss, non-intentional  R63.4     9. Chronic throat clearing  R09.89      10. Physical deconditioning  R53.81     11. Back pain, unspecified back location, unspecified back pain laterality, unspecified chronicity  M54.9          Plan:     Patient Instructions  IPF (idiopathic pulmonary fibrosis) (HCC) Chronic respiratory failure after COVID-19 related hospitalization summer 2023   -PFT  and symptoms stable OCt 2022 -> May 2023 -> but after COVID hospitalization in summer 2023  - L ng function is now 25% worse compared to prehospitalization baseline -However you are improving and your oxygen status is also improving which is very encouraging  -I think the improvement is because of the steroid course   Plan -continue esbriet 2 pills three times daily with food --Continue prednisone taper as outlined  - On 03/12/2022 -reduce prednisone to 5 mg daily x2 weeks and then 03/26/2022 reduce prednisone to 5 mg Monday Wednesday Friday for 2 weeks and then on April 09, 2022 take prednisone 5 mg on Monday alone for 2 weeks and stop -Monitor oxygen levels  -Goal pulse ox should be greater than 92%  -For now continue to use oxygen at night regardless of pulse ox   Weight loss, non-intentional  - losing weight again with esbriet xsince  02/06/2021 and subsequent weight loss in October 2023 following hospitalization   Plan -monitor for weight loss with esbriet -Just maintain at lower dose esbriet 2 pills three times daily -Continue high-protein diet as below  COUGH and DYSPHAGIA and clearing throat New History of intubation - summer 2023  - likely due to IPF and cough neuropathy but dysphagia (esophageal stenosis) probably playing a role - 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 Benson Norway mid November 2023  -Should be done in the hospital with anesthesia support  -You improvement in oxygenation status lowest the risk  -Refer to ENT   Physical deconditioning - new since summer 2023  -Much improved after hospitalization and inpatient rehab but still deconditioned compared to baseline  Plan  - Continue home physical therapy -Continue strengthening exercises -Continue high-protein vegetarian diet through legumes and tofu   Back pain  -Sustained in the hospital for which gabapentin was started but now improved and unclear if you still need gabapentin  Plan  - Reduce gabapentin 200 mg twice daily for 1 week and then 100 mg once daily for 1 week and then 100 mg on Monday Wednesday and Friday for 1 week and stop   Follow-up -4-6 weeks with nurse practitioner for a 30-minute visit to ensure progress -12 weeks do spirometry and DLCO and return to see Dr. Chase Caller for a 30-minute visit in January 2024  High complex medical condition requiring high risk prescription with intensive therapeutic monitoring requirement  ( Level 05 visit: Estb 40-54 min  in  visit type: on-site physical face to visit  in total care time and counseling or/and coordination of care by this undersigned MD - Dr Brand Males. This includes one or more of the following on this same day 02/25/2022: pre-charting, chart review, note writing, documentation discussion of test results, diagnostic or treatment recommendations, prognosis, risks and benefits of management options, instructions, education, compliance or risk-factor reduction. It excludes time spent by the [place] or office staff in the care of the patient. Actual time 8 min)  SIGNATURE    Dr. Brand Males, M.D., F.C.C.P,  Pulmonary and Critical Care Medicine Staff Physician, [place]  Director - Interstitial Lung Disease  Program  [facility] -  [place] at [place], [place], [address]  Pager: [phone], If no answer or between  15:00h - 7:00h: call [phone] Telephone: [phone]  4:55 PM 02/25/2022

## 2022-02-25 NOTE — Progress Notes (Signed)
Spiro and Dlco done today.  

## 2022-02-25 NOTE — Patient Instructions (Addendum)
IPF (idiopathic pulmonary fibrosis) (HCC) Chronic respiratory failure after COVID-19 related hospitalization summer 2023   -PFT and symptoms stable OCt 2022 -> May 2023 -> but after COVID hospitalization in summer 2023  - L ng function is now 25% worse compared to prehospitalization baseline -However you are improving and your oxygen status is also improving which is very encouraging  -I think the improvement is because of the steroid course   Plan -continue esbriet 2 pills three times daily with food --Continue prednisone taper as outlined  - On 03/12/2022 -reduce prednisone to 5 mg daily x2 weeks and then 03/26/2022 reduce prednisone to 5 mg Monday Wednesday Friday for 2 weeks and then on April 09, 2022 take prednisone 5 mg on Monday alone for 2 weeks and stop - continue bactrim for now - stop after coming down to less than '5mg'$  per day  -Monitor oxygen levels  -Goal pulse ox should be greater than 92%  -For now continue to use oxygen at night regardless of pulse ox   Weight loss, non-intentional  - losing weight again with esbriet xsince  02/06/2021 and subsequent weight loss in October 2023 to 114# following hospitalization    Plan -monitor for weight loss with esbriet -Just maintain at lower dose esbriet 2 pills three times daily -Continue high-protein diet as below  COUGH and DYSPHAGIA and clearing throat New History of intubation - summer 2023  - likely due to IPF and cough neuropathy but dysphagia (esophageal stenosis) probably playing a role - 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 Benson Norway mid November 2023  -Should be done in the hospital with anesthesia support  -You improvement in oxygenation status lowest the risk  -Refer to ENT   Physical deconditioning - new since summer 2023  -Much improved after hospitalization and inpatient rehab but still deconditioned compared to baseline  Plan  - Continue home physical therapy -Continue strengthening exercises -Continue high-protein vegetarian diet through legumes and tofu   Back pain  -Sustained in the hospital for which gabapentin was started but now improved and unclear if you still need gabapentin  Plan  - Reduce gabapentin 200 mg twice daily for 1 week and then 100 mg once daily for 1 week and then 100 mg on Monday Wednesday and Friday for 1 week and stop   Follow-up -4-6 weeks with nurse practitioner for a 30-minute visit to ensure progress -12 weeks do spirometry and DLCO and return to see Dr. Chase Caller for a 30-minute visit in January 2024

## 2022-02-26 ENCOUNTER — Telehealth: Payer: Self-pay | Admitting: Pharmacist

## 2022-02-26 DIAGNOSIS — J849 Interstitial pulmonary disease, unspecified: Secondary | ICD-10-CM

## 2022-02-26 DIAGNOSIS — J84112 Idiopathic pulmonary fibrosis: Secondary | ICD-10-CM

## 2022-02-26 NOTE — Telephone Encounter (Signed)
Received notification from Dr. Chase Caller.  LFTs on 01/21/22 ALT elevated at [place], PharmD, MPH, BCPS, CPP Clinical Pharmacist (Rheumatology and Pulmonology)

## 2022-02-28 MED ORDER — PIRFENIDONE 267 MG PO TABS: 534.0000 mg | ORAL_TABLET | Freq: Three times a day (TID) | ORAL | 1 refills | Status: DC

## 2022-03-04 ENCOUNTER — Encounter (HOSPITAL_COMMUNITY): Payer: Self-pay | Admitting: Gastroenterology

## 2022-03-12 ENCOUNTER — Ambulatory Visit (HOSPITAL_BASED_OUTPATIENT_CLINIC_OR_DEPARTMENT_OTHER): Payer: Medicare Other | Admitting: Certified Registered Nurse Anesthetist

## 2022-03-12 ENCOUNTER — Encounter (HOSPITAL_COMMUNITY): Payer: Self-pay | Admitting: Gastroenterology

## 2022-03-12 ENCOUNTER — Inpatient Hospital Stay: Payer: Medicare Other | Admitting: Physical Medicine and Rehabilitation

## 2022-03-12 ENCOUNTER — Ambulatory Visit (HOSPITAL_COMMUNITY): Payer: Medicare Other | Admitting: Certified Registered Nurse Anesthetist

## 2022-03-12 ENCOUNTER — Other Ambulatory Visit: Payer: Self-pay

## 2022-03-12 ENCOUNTER — Encounter (HOSPITAL_COMMUNITY)
Admission: RE | Disposition: A | Discharge status: Home / Self Care | Payer: Self-pay | Source: Home / Self Care | Attending: Gastroenterology

## 2022-03-12 ENCOUNTER — Ambulatory Visit (HOSPITAL_COMMUNITY)
Admission: RE | Admit: 2022-03-12 | Discharge: 2022-03-12 | Disposition: A | Discharge status: Home / Self Care | Payer: Medicare Other | Attending: Gastroenterology | Admitting: Gastroenterology

## 2022-03-12 DIAGNOSIS — G250 Essential tremor: Secondary | ICD-10-CM | POA: Diagnosis not present

## 2022-03-12 DIAGNOSIS — K222 Esophageal obstruction: Secondary | ICD-10-CM

## 2022-03-12 DIAGNOSIS — Z8616 Personal history of COVID-19: Secondary | ICD-10-CM | POA: Diagnosis not present

## 2022-03-12 DIAGNOSIS — I739 Peripheral vascular disease, unspecified: Secondary | ICD-10-CM

## 2022-03-12 DIAGNOSIS — D649 Anemia, unspecified: Secondary | ICD-10-CM

## 2022-03-12 DIAGNOSIS — R1310 Dysphagia, unspecified: Secondary | ICD-10-CM | POA: Diagnosis present

## 2022-03-12 HISTORY — PX: SAVORY DILATION: SHX5439

## 2022-03-12 HISTORY — PX: ESOPHAGOGASTRODUODENOSCOPY (EGD) WITH PROPOFOL: SHX5813

## 2022-03-12 LAB — GLUCOSE, CAPILLARY: Glucose-Capillary: 115 mg/dL — ABNORMAL HIGH (ref 70–99)

## 2022-03-12 SURGERY — ESOPHAGOGASTRODUODENOSCOPY (EGD) WITH PROPOFOL
Anesthesia: Monitor Anesthesia Care

## 2022-03-12 MED ORDER — PROPOFOL 10 MG/ML IV BOLUS
INTRAVENOUS | Status: DC | PRN
  Administered 2022-03-12: 40 mg via INTRAVENOUS

## 2022-03-12 MED ORDER — LIDOCAINE 2% (20 MG/ML) 5 ML SYRINGE
INTRAMUSCULAR | Status: DC | PRN
  Administered 2022-03-12: 40 mg via INTRAVENOUS

## 2022-03-12 MED ORDER — SODIUM CHLORIDE 0.9 % IV SOLN: INTRAVENOUS | Status: DC

## 2022-03-12 MED ORDER — PROPOFOL 500 MG/50ML IV EMUL
INTRAVENOUS | Status: DC | PRN
  Administered 2022-03-12: 125 ug/kg/min via INTRAVENOUS

## 2022-03-12 MED ORDER — LACTATED RINGERS IV SOLN: INTRAVENOUS | Status: DC | PRN

## 2022-03-12 SURGICAL SUPPLY — 15 items

## 2022-03-12 NOTE — Discharge Instructions (Signed)

## 2022-03-12 NOTE — Transfer of Care (Signed)
Immediate Anesthesia Transfer of Care Note  Patient: Jeff Willis  Procedure(s) Performed: ESOPHAGOGASTRODUODENOSCOPY (EGD) WITH PROPOFOL SAVORY DILATION  Patient Location: PACU  Anesthesia Type:MAC  Level of Consciousness: awake  Airway & Oxygen Therapy: Patient Spontanous Breathing and Patient connected to face mask oxygen  Post-op Assessment: Report given to RN and Post -op Vital signs reviewed and stable  Post vital signs: Reviewed and stable  Last Vitals:  Vitals Value Taken Time  BP    Temp    Pulse 74 03/12/22 1234  Resp 24 03/12/22 1234  SpO2 100 % 03/12/22 1234  Vitals shown include unvalidated device data.  Last Pain:  Vitals:   03/12/22 1143  TempSrc: Tympanic  PainSc: 0-No pain         Complications: No notable events documented.

## 2022-03-12 NOTE — Anesthesia Procedure Notes (Signed)
Procedure Name: MAC Date/Time: 03/12/2022 12:09 PM  Performed by: Cynda Familia, CRNAPre-anesthesia Checklist: Patient identified, Emergency Drugs available, Suction available, Patient being monitored and Timeout performed Oxygen Delivery Method: Simple face mask Placement Confirmation: positive ETCO2 and breath sounds checked- equal and bilateral Dental Injury: Teeth and Oropharynx as per pre-operative assessment

## 2022-03-12 NOTE — H&P (Signed)
Jeff Willis HPI: The patient is here for treatment of his recurrent esophageal strictures.  An EGD with Savary dilation up to 15 mm was performed on 08/10/2021.  Since that time he reports a recurrence of his dysphagia symptoms.  Past Medical History:  Diagnosis Date   Back pain    Elevated PSA    Essential tremor    Hypercholesteremia    Neuropathy    Prostate cancer ([place])    Sinus tachycardia 01/30/2018    Past Surgical History:  Procedure Laterality Date   COLONOSCOPY WITH PROPOFOL N/A 08/10/2021   Procedure: COLONOSCOPY WITH PROPOFOL;  Surgeon: Carol Ada, MD;  Location: [facility];  Service: Gastroenterology;  Laterality: N/A;   ESOPHAGEAL DILATION  08/10/2021   Procedure: ESOPHAGEAL DILATION;  Surgeon: Carol Ada, MD;  Location: [facility];  Service: Gastroenterology;;   ESOPHAGOGASTRODUODENOSCOPY (EGD) WITH PROPOFOL N/A 08/10/2021   Procedure: ESOPHAGOGASTRODUODENOSCOPY (EGD) WITH PROPOFOL;  Surgeon: Carol Ada, MD;  Location: [facility];  Service: Gastroenterology;  Laterality: N/A;   HEMOSTASIS CLIP PLACEMENT  08/10/2021   Procedure: HEMOSTASIS CLIP PLACEMENT;  Surgeon: Carol Ada, MD;  Location: [facility];  Service: Gastroenterology;;   LUMBAR MICRODISCECTOMY     L4-5   POLYPECTOMY  08/10/2021   Procedure: POLYPECTOMY;  Surgeon: Carol Ada, MD;  Location: [facility];  Service: Gastroenterology;;   RADIOACTIVE SEED IMPLANT N/A 02/24/2018   Procedure: RADIOACTIVE SEED IMPLANT/BRACHYTHERAPY IMPLANT;  Surgeon: Festus Aloe, MD;  Location: [facility];  Service: Urology;  Laterality: N/A;   RIGHT HEART CATH N/A 11/22/2021   Procedure: RIGHT HEART CATH;  Surgeon: Troy Sine, MD;  Location: [facility];  Service: Cardiovascular;  Laterality: N/A;   SPACE OAR INSTILLATION N/A 02/24/2018   Procedure: SPACE OAR INSTILLATION;  Surgeon: Festus Aloe, MD;  Location: [facility];  Service: Urology;   Laterality: N/A;    Family History  Problem Relation Age of Onset   Other Mother        died at 41 of natural causes   Other Father        unsure of health   Cancer Neg Hx     Social History:  reports that he has never smoked. He has never used smokeless tobacco. He reports that he does not drink alcohol and does not use drugs.  Allergies:  Allergies  Allergen Reactions   Beef-Derived Products Other (See Comments)    Vegetarian   Chicken Protein Other (See Comments)    Vegetarian   Eggs Or Egg-Derived Products Other (See Comments)    Vegetarian   Fish-Derived Products Other (See Comments)    Vegetarian   Meat Extract Other (See Comments)    Vegetarian   Ofev [Nintedanib] Other (See Comments)    Weight loss   Pork-Derived Products Other (See Comments)    Vegetarian    Medications: Scheduled: Continuous:  sodium chloride      No results found for this or any previous visit (from the past 24 hour(s)).   No results found.  ROS:  As stated above in the HPI otherwise negative.  There were no vitals taken for this visit.    PE: Gen: NAD, Alert and Oriented HEENT:  [place]/AT, EOMI Neck: Supple, no LAD Lungs: CTA Bilaterally CV: RRR without M/G/R ABD: Soft, NTND, +BS Ext: No C/C/E  Assessment/Plan: 1) Esophageal stricture - EGD with dilation.  Jeff Willis D 03/12/2022, 11:43 AM

## 2022-03-12 NOTE — Op Note (Signed)
[facility] Patient Name: Jeff Willis Procedure Date: 03/12/2022 MRN: [id number] Attending MD: Carol Ada , MD, [id number] Date of Birth: 1940/12/12 CSN: [id number] Age: 81 Admit Type: Outpatient Procedure:                Upper GI endoscopy Indications:              Dysphagia Providers:                Carol Ada, MD, Jaci Carrel, RN, Cherylynn Ridges, Technician, Glenis Smoker, CRNA Referring MD:              Medicines:                Propofol per Anesthesia Complications:            No immediate complications. Estimated Blood Loss:     Estimated blood loss: none. Procedure:                Pre-Anesthesia Assessment:                           - Prior to the procedure, a History and Physical                            was performed, and patient medications and                            allergies were reviewed. The patient's tolerance of                            previous anesthesia was also reviewed. The risks                            and benefits of the procedure and the sedation                            options and risks were discussed with the patient.                            All questions were answered, and informed consent                            was obtained. Prior Anticoagulants: The patient has                            taken no anticoagulant or antiplatelet agents. ASA                            Grade Assessment: III - A patient with severe                            systemic disease. After reviewing the risks and  benefits, the patient was deemed in satisfactory                            condition to undergo the procedure.                           - Sedation was administered by an anesthesia                            professional. Deep sedation was attained.                           After obtaining informed consent, the endoscope was                            passed under  direct vision. Throughout the                            procedure, the patient's blood pressure, pulse, and                            oxygen saturations were monitored continuously. The                            [id number]-H190 ([id number] Olympus endoscope was introduced                            through the mouth, and advanced to the second part                            of duodenum. The upper GI endoscopy was                            accomplished without difficulty. The patient                            tolerated the procedure well. Scope In: Scope Out: Findings:      One benign-appearing, intrinsic mild stenosis was found at the       cricopharyngeus. This stenosis measured 1.3 cm (inner diameter) x 2 cm       (in length). The stenosis was traversed. A guidewire was placed and the       scope was withdrawn. Dilation was performed with a Savary dilator with       no resistance at 15 mm. The dilation site was examined following       endoscope reinsertion and showed complete resolution of luminal       narrowing. Estimated blood loss was minimal.      The stomach was normal.      The examined duodenum was normal. Impression:               - Benign-appearing esophageal stenosis. Dilated.                           - Normal stomach.                           -  Normal examined duodenum.                           - No specimens collected. Moderate Sedation:      Not Applicable - Patient had care per Anesthesia. Recommendation:           - Patient has a contact number available for                            emergencies. The signs and symptoms of potential                            delayed complications were discussed with the                            patient. Return to normal activities tomorrow.                            Written discharge instructions were provided to the                            patient.                           - Resume previous diet.                            - Continue present medications.                           - Follow up with Dr. Collene Mares in 1 month.                           - Repeat EGD with dilation if symptoms of dysphagia                            persist. Procedure Code(s):        --- Professional ---                           [phone], Esophagogastroduodenoscopy, flexible,                            transoral; with insertion of guide wire followed by                            passage of dilator(s) through esophagus over guide                            wire Diagnosis Code(s):        --- Professional ---                           K22.2, Esophageal obstruction                           R13.10, Dysphagia, unspecified CPT copyright 2022 American  Medical Association. All rights reserved. The codes documented in this report are preliminary and upon coder review may  be revised to meet current compliance requirements. Carol Ada, MD Carol Ada, MD 03/12/2022 12:29:01 PM This report has been signed electronically. Number of Addenda: 0

## 2022-03-12 NOTE — Anesthesia Preprocedure Evaluation (Signed)
Anesthesia Evaluation  Patient identified by MRN, date of birth, ID band Patient awake    Reviewed: Allergy & Precautions, NPO status , Patient's Chart, lab work & pertinent test results  Airway Mallampati: II  TM Distance: >3 FB Neck ROM: Full    Dental  (+) Dental Advisory Given, Teeth Intact   Pulmonary neg shortness of breath, neg sleep apnea, neg COPD S/p covid with intubation and hospitalization in September   breath sounds clear to auscultation       Cardiovascular + Peripheral Vascular Disease   Rhythm:Regular   1. Left ventricular ejection fraction, by estimation, is 55 to 60%. The  left ventricle has normal function. The left ventricle has no regional  wall motion abnormalities. Left ventricular diastolic parameters are  indeterminate.   2. Right ventricular systolic function is normal. The right ventricular  size is normal. There is moderately elevated pulmonary artery systolic  pressure.   3. The mitral valve is normal in structure. No evidence of mitral valve  regurgitation. No evidence of mitral stenosis.   4. Tricuspid valve regurgitation is moderate.   5. The aortic valve is normal in structure. Aortic valve regurgitation is  not visualized. No aortic stenosis is present.   6. The inferior vena cava is normal in size with greater than 50%  respiratory variability, suggesting right atrial pressure of 3 mmHg.     Neuro/Psych Essential tremor  Neuromuscular disease  negative psych ROS   GI/Hepatic Neg liver ROS,GERD  ,,  Endo/Other  negative endocrine ROS  Lab Results      Component                Value               Date                      HGBA1C                   5.8 (H)             01/01/2022             Renal/GU Lab Results      Component                Value               Date                      CREATININE               1.02                01/28/2022                Musculoskeletal  (+)  Arthritis ,    Abdominal   Peds  Hematology  (+) Blood dyscrasia, anemia Lab Results      Component                Value               Date                      WBC                      7.1                 01/28/2022  HGB                      12.0 (L)            01/28/2022                HCT                      33.8 (L)            01/28/2022                MCV                      94.2                01/28/2022                PLT                      127 (L)             01/28/2022              Anesthesia Other Findings   Reproductive/Obstetrics                              Anesthesia Physical Anesthesia Plan  ASA: 3  Anesthesia Plan: MAC   Post-op Pain Management: Minimal or no pain anticipated   Induction:   PONV Risk Score and Plan: 1 and Propofol infusion and Treatment may vary due to age or medical condition  Airway Management Planned: Nasal Cannula and Natural Airway  Additional Equipment: None  Intra-op Plan:   Post-operative Plan:   Informed Consent: I have reviewed the patients History and Physical, chart, labs and discussed the procedure including the risks, benefits and alternatives for the proposed anesthesia with the patient or authorized representative who has indicated his/her understanding and acceptance.     Dental advisory given  Plan Discussed with: CRNA  Anesthesia Plan Comments:          Anesthesia Quick Evaluation

## 2022-03-13 NOTE — Anesthesia Postprocedure Evaluation (Signed)
Anesthesia Post Note  Patient: Jeff Willis  Procedure(s) Performed: ESOPHAGOGASTRODUODENOSCOPY (EGD) WITH PROPOFOL SAVORY DILATION     Patient location during evaluation: PACU Anesthesia Type: MAC Level of consciousness: awake and alert Pain management: pain level controlled Vital Signs Assessment: post-procedure vital signs reviewed and stable Respiratory status: spontaneous breathing, nonlabored ventilation and respiratory function stable Cardiovascular status: stable and blood pressure returned to baseline Postop Assessment: no apparent nausea or vomiting Anesthetic complications: no   No notable events documented.  Last Vitals:  Vitals:   03/12/22 1250 03/12/22 1300  BP: 120/71 121/69  Pulse: 76 76  Resp: 19 20  Temp:    SpO2: 96% 96%    Last Pain:  Vitals:   03/12/22 1300  TempSrc:   PainSc: 0-No pain                 Latandra Loureiro

## 2022-03-15 ENCOUNTER — Encounter (HOSPITAL_COMMUNITY): Payer: Self-pay | Admitting: Gastroenterology

## 2022-03-19 ENCOUNTER — Other Ambulatory Visit: Payer: Self-pay | Admitting: Internal Medicine

## 2022-03-19 ENCOUNTER — Telehealth: Payer: Self-pay | Admitting: *Deleted

## 2022-03-19 NOTE — Telephone Encounter (Signed)
Please advise on refill request

## 2022-03-19 NOTE — Telephone Encounter (Signed)
Yeah probbly seen by APP this week to be on safe side. Can stop bactrim if he is on pred '5mg'$  per day

## 2022-03-19 NOTE — Telephone Encounter (Signed)
Called and spoke with pt's son and let him know the info per MR and he verbalized understanding. Appt scheduled for pt with [facility] 11/22. Nothing further needed.

## 2022-03-19 NOTE — Telephone Encounter (Signed)
patient's son called and would like advice on if patient needs to be seen. Had endoscopy on 11/14 and has had significant coughing.   Please call and advise. [phone] Son [name].

## 2022-03-19 NOTE — Telephone Encounter (Signed)
Spoke with pt's son Jeff Willis (per DPR) who states pt has increased productive cough (white thin mucus). [name] did state pt had a endoscopy with esophagus stretching last Tuesday and is wondering if cough could be coming from that. [name] denies pt having any fever/ chills/ SOB/ wheezing/ GI upset. Pt is on a long prednisone taper currently taking 5 mg daily as well as bactrim (Mon, Wed, Fri) and Esibert. Jeff Willis is wondering if pt needs to be seen. [name] spoke with pt PCP who deferred matter to Pulmonary.  Dr. Chase Caller please advise.   Triage please call son Jeff Willis at [phone] with Dr. Golden Pop response.

## 2022-03-20 ENCOUNTER — Encounter: Payer: Self-pay | Admitting: Nurse Practitioner

## 2022-03-20 ENCOUNTER — Ambulatory Visit: Payer: Medicare Other | Admitting: Nurse Practitioner

## 2022-03-20 ENCOUNTER — Ambulatory Visit (INDEPENDENT_AMBULATORY_CARE_PROVIDER_SITE_OTHER): Payer: Medicare Other

## 2022-03-20 ENCOUNTER — Telehealth: Payer: Self-pay | Admitting: Nurse Practitioner

## 2022-03-20 VITALS — BP 114/64 | HR 96 | Ht 67.0 in | Wt 114.6 lb

## 2022-03-20 DIAGNOSIS — J84112 Idiopathic pulmonary fibrosis: Secondary | ICD-10-CM | POA: Diagnosis not present

## 2022-03-20 DIAGNOSIS — J9611 Chronic respiratory failure with hypoxia: Secondary | ICD-10-CM

## 2022-03-20 DIAGNOSIS — J189 Pneumonia, unspecified organism: Secondary | ICD-10-CM

## 2022-03-20 MED ORDER — AMOXICILLIN-POT CLAVULANATE 400-57 MG/5ML PO SUSR: 875.0000 mg | Freq: Two times a day (BID) | ORAL | 0 refills | Status: AC

## 2022-03-20 NOTE — Assessment & Plan Note (Signed)
IPF with acute, productive cough post EGD with esophageal dilation on 11/14. Symptoms have slightly improved today. Given he is at high risk for decompensation, recommended we obtain CXR to rule out aspiration pna with his recent procedure. If clear, possible there was some upper airway irritation or worsening reflux that contributed to the cough. He will target cough control measures. If symptoms worsen or do not continue to improve, we will consider additional treatment with steroid taper and empiric abx course.   Patient Instructions  Continue pirfenidone 2 tablets 3 times daily.  Take with food.  Wear sunscreen when outside Continue prednisone taper as previously directed.  Okay to stop Bactrim since you are below 20 mg daily. Continue Tussionex 5 mL every 12 hours as needed for cough Continue supplemental oxygen for goal >88-90%   Glad you are starting to feel better.  We will check a chest x-ray today just to ensure that you have not developed any sort of aspiration pneumonia following your procedure.  Follow-up with Dr. Chase Caller as scheduled. If symptoms do not improve or worsen, please contact office for sooner follow up or seek emergency care.

## 2022-03-20 NOTE — Patient Instructions (Addendum)
Continue pirfenidone 2 tablets 3 times daily.  Take with food.  Wear sunscreen when outside Continue prednisone taper as previously directed.  Okay to stop Bactrim since you are below 20 mg daily. Continue Tussionex 5 mL every 12 hours as needed for cough Continue supplemental oxygen for goal >88-90%   Glad you are starting to feel better.  We will check a chest x-ray today just to ensure that you have not developed any sort of aspiration pneumonia following your procedure.  Follow-up with Dr. Chase Caller as scheduled. If symptoms do not improve or worsen, please contact office for sooner follow up or seek emergency care.

## 2022-03-20 NOTE — Telephone Encounter (Signed)
03/20/2022: CXR showed increased left lung opacity, concerning for superimposed infection. Attempted to contact the pt - currently sleeping. Spoke with his son, Jeff Willis, who is his DPR. Imaging findings and symptoms consistent with a pneumonia. Pt does not have any significant drug allergies but does have trouble swallowing pills. Augmentin suspension 875 mg (10.30m) Twice daily for 7 days sent to his pharmacy. Advised him to take with food. Probiotic daily. Verbalized understanding and all questions answered.

## 2022-03-20 NOTE — Progress Notes (Signed)
$'@Patient'f$  ID: Jeff Willis, male    DOB: 05/22/1940, 81 y.o.   MRN: [id number]  Chief Complaint  Patient presents with   Acute Visit    Had endoscopy a week ago, pt states he started coughing after    Referring provider: Gaynelle Arabian, MD  HPI: 81 year old male, never smoker followed for IPF and chronic respiratory failure on supplemental O2.  He is a patient Dr. Golden Pop and last seen in office 02/25/2022.  Past medical history significant for PAD, GERD with esophageal stricture, history of prostate cancer, protein calorie malnutrition.  TEST/EVENTS:  10/06/2019 FVC 78, FEV1 89, ratio 86, DLCO 55 01/17/2020 PFT: FVC 83, FEV1 95, ratio 86, DLCO 48 09/22/2020 PFT: FVC 89, FEV1 102, ratio 87, DLCO 62 02/05/2021 PFT: FVC 85, FEV1 97, ratio 87, DLCO 49 09/07/2021 PFT: FVC 84, FEV1 95, ratio 85, DLCO 45 12/18/2021 HRCT chest: Atherosclerosis.  No LAD.  Widespread areas of groundglass attenuation, septal thickening, subpleural reticulation, mild cylindrical bronchiectasis and peripheral bronchiolectasis are again noted throughout the lungs bilaterally.  Few areas of honeycombing.  UIP pattern.  Aneurysmal dilatation of the ascending thoracic aorta (4.5 cm).  5 mm nonobstructive calculi in the upper pole of the right kidney 01/02/2022 echocardiogram: EF 55 to 60%.  RV size and function is normal.  Moderately elevated PASP. 02/25/2022 PFT: FVC 63, FEV1 74, ratio 89, DLCOcor 34  02/25/2022: OV with Dr. Chase Caller.  He had COVID-19 infection with ILD flareup in summer 2023 followed by aspiration pneumonia and acute respiratory failure needing oxygen intubation.  Has been on prednisone therapy since discharge.  Miraculously survived.  Continues to improve at home.  He is on Bactrim for PJP prophylaxis.  Not requiring O2 at rest.  Has struggled with weight loss.  Fortunately gained some weight back since his hospitalization.  Back on Esbriet for the last few weeks.  Tolerating this well.  Is  struggling with cough and clearing of the throat.  Felt as though it is related to his dysphagia issues.  He is scheduled for esophageal dilation with Dr. Almyra Free in mid November 2023.  Feels that he may have some sinus drainage-encouraged him to use Flonase and Mucinex.  Shared decision to come off of gabapentin as there was no perceived benefit from use.  Work on prednisone taper.  Referred to ENT.  03/20/2022: Today-acute Patient presents today with his son for acute visit.  He underwent endoscopy with esophageal dilation on 11/14.  They called the office yesterday 11/21 and reported that he had significant coughing since this.  He also started producing sputum with white mucus which was new for him.  His cough is normally dry.  Denied any fever, chills, increased shortness of breath or wheezing.  Did not have any GI upset.  Advised to come in for office visit.  Today, they report that he has been feeling a little bit better this morning.  Cough is not as frequent.  Still getting up a small amount of white sputum.  Denies any fevers, chills, hemoptysis, night sweats.  Breathing feels like it is at his baseline.  Appetite is at his baseline. No increased O2 requirement. Occasionally using the Tussionex for cough.  He is currently on 5 mg daily of prednisone.  Plan is to be entirely off of it by Christmas Day.  They were told yesterday that they could discontinue his Bactrim.  Allergies  Allergen Reactions   Beef-Derived Products Other (See Comments)    Vegetarian   Chicken  Protein Other (See Comments)    Vegetarian   Eggs Or Egg-Derived Products Other (See Comments)    Vegetarian   Fish-Derived Products Other (See Comments)    Vegetarian   Meat Extract Other (See Comments)    Vegetarian   Ofev [Nintedanib] Other (See Comments)    Weight loss   Pork-Derived Products Other (See Comments)    Vegetarian    Immunization History  Administered Date(s) Administered   Influenza, High Dose Seasonal PF  01/28/2019, 01/12/2021, 02/11/2022   Influenza-Unspecified 01/05/2020   PFIZER(Purple Top)SARS-COV-2 Vaccination 07/23/2019, 08/17/2019   Pneumococcal-Unspecified 02/28/2015   Zoster, Live 01/26/2021    Past Medical History:  Diagnosis Date   Back pain    Elevated PSA    Essential tremor    Hypercholesteremia    Neuropathy    Prostate cancer ([place])    Sinus tachycardia 01/30/2018    Tobacco History: Social History   Tobacco Use  Smoking Status Never  Smokeless Tobacco Never   Counseling given: Not Answered   Outpatient Medications Prior to Visit  Medication Sig Dispense Refill   aspirin EC 81 MG tablet Take 81 mg by mouth daily. Swallow whole.     chlorpheniramine-HYDROcodone (TUSSIONEX) 10-8 MG/5ML Take 5 mLs by mouth every 12 (twelve) hours as needed for cough. 300 mL 0   Continuous Blood Gluc Sensor (FREESTYLE LIBRE 3 SENSOR) MISC 1 each by Does not apply route 4 (four) times daily -  before meals and at bedtime. Place 1 sensor on the skin every 14 days. Use to check glucose continuously 2 each 3   glucose blood (FREESTYLE LITE) test strip Use ACH/HS to check CBGs 60 each 5   melatonin 3 MG TABS tablet Take 1 tablet (3 mg total) by mouth at bedtime as needed.  0   metoprolol succinate (TOPROL-XL) 25 MG 24 hr tablet TAKE ONE TABLET BY MOUTH DAILY 90 tablet 0   Multiple Vitamin (MULTIVITAMIN WITH MINERALS) TABS tablet Take 1 tablet by mouth daily.     pantoprazole (PROTONIX) 40 MG tablet Take 1 tablet (40 mg total) by mouth 2 (two) times daily.     Pirfenidone (ESBRIET) 267 MG TABS Take 2 tablets (534 mg total) by mouth in the morning, at noon, and at bedtime. **LOW DOSE AS MAINTENANCE** 540 tablet 1   polyethylene glycol powder (GLYCOLAX/MIRALAX) 17 GM/SCOOP powder Take 17 g by mouth 2 (two) times daily. 238 g 0   predniSONE (DELTASONE) 5 MG tablet On 11/14, take '5mg'$  dailyx2 weeks, then 11/28, take '5mg'$  Mon,Wed,Fri for 2 weeks, then 12/12 take '5mg'$  Monx2 weeks, then stop  (Patient taking differently: Take 10 mg by mouth daily with breakfast. On 11/14, take '5mg'$  dailyx2 weeks, then 11/28, take '5mg'$  Mon,Wed,Fri for 2 weeks, then 12/12 take '5mg'$  Monx2 weeks, then stop) 22 tablet 0   rosuvastatin (CRESTOR) 10 MG tablet Take 10 mg by mouth at bedtime.     tamsulosin (FLOMAX) 0.4 MG CAPS capsule Take 2 capsules (0.8 mg total) by mouth daily after supper. 60 capsule 0   Vitamin D, Ergocalciferol, (DRISDOL) 1.25 MG (50000 UNIT) CAPS capsule Take 1 capsule (50,000 Units total) by mouth every 7 (seven) days. 5 capsule 0   bethanechol (URECHOLINE) 10 MG tablet Take 10 mg by mouth 2 (two) times daily.     gabapentin (NEURONTIN) 250 MG/5ML solution Take 4 mLs (200 mg total) by mouth 2 (two) times daily for 7 days, THEN 2 mLs (100 mg total) daily for 7 days, THEN 2 mLs (  100 mg total) 3 (three) times a week for 7 days. (Patient taking differently: 100 mg  at bedtime) 76 mL 0   metFORMIN (GLUCOPHAGE) 850 MG tablet Take 1 tablet (850 mg total) by mouth 2 (two) times daily with a meal. (Patient taking differently: Take 850 mg by mouth daily with breakfast.) 60 tablet 0   sulfamethoxazole-trimethoprim (BACTRIM DS) 800-160 MG tablet Take 1 tablet by mouth 3 (three) times a week. (Patient not taking: Reported on 03/20/2022) 12 tablet 1   No facility-administered medications prior to visit.     Review of Systems:   Constitutional: No weight loss or gain, night sweats, fevers, chills. +fatigue, lassitude (baseline) HEENT: No sneezing, itching, ear ache, nasal congestion, or post nasal drip CV:  No chest pain, orthopnea, PND, swelling in lower extremities, anasarca, dizziness, palpitations, syncope Resp: +shortness of breath with exertion; productive cough (improved). No hemoptysis. No wheezing.  No chest wall deformity GI:  No heartburn, indigestion, abdominal pain, nausea, vomiting GU: No dysuria, change in color of urine, urgency or frequency.   Neuro: No dizziness or lightheadedness.   Psych: No depression or anxiety. Mood stable.     Physical Exam:  BP 114/64 (BP Location: Right Arm, Patient Position: Sitting, Cuff Size: Normal)   Pulse 96   Ht '5\' 7"'$  (1.702 m)   Wt 114 lb 9.6 oz (52 kg)   SpO2 97%   BMI 17.95 kg/m   GEN: Pleasant, interactive, chronically-ill appearing; in no acute distress HEENT:  Normocephalic and atraumatic. PERRLA. Sclera white. Nasal turbinates pink, moist and patent bilaterally. No rhinorrhea present. Oropharynx erythematous and moist, without exudate or edema. No lesions, ulcerations, or postnasal drip.  NECK:  Supple w/ fair ROM. No JVD present. Normal carotid impulses w/o bruits. Thyroid symmetrical with no goiter or nodules palpated. No lymphadenopathy.   CV: RRR, no m/r/g, no peripheral edema. Pulses intact, +2 bilaterally. No cyanosis, pallor or clubbing. PULMONARY:  Unlabored, regular breathing. Mild bibasilar crackles otherwise clear bilaterally A&P w/o wheezes/rales/rhonchi. No accessory muscle use.  GI: BS present and normoactive. Soft, non-tender to palpation. No organomegaly or masses detected. MSK: No erythema, warmth or tenderness. Cap refil <2 sec all extrem. No deformities or joint swelling noted.  Neuro: A/Ox3. No focal deficits noted.   Skin: Warm, no lesions or rashe Psych: Normal affect and behavior. Judgement and thought content appropriate.     Lab Results:  CBC    Component Value Date/Time   WBC 7.1 01/28/2022 0706   RBC 3.59 (L) 01/28/2022 0706   HGB 12.0 (L) 01/28/2022 0706   HGB 13.7 11/14/2021 1610   HCT 33.8 (L) 01/28/2022 0706   HCT 39.3 11/14/2021 1610   PLT 127 (L) 01/28/2022 0706   PLT 171 11/14/2021 1610   MCV 94.2 01/28/2022 0706   MCV 93 11/14/2021 1610   MCH 33.4 01/28/2022 0706   MCHC 35.5 01/28/2022 0706   RDW 13.7 01/28/2022 0706   RDW 12.2 11/14/2021 1610   LYMPHSABS 1.6 01/21/2022 0901   MONOABS 0.4 01/21/2022 0901   EOSABS 0.3 01/21/2022 0901   BASOSABS 0.0 01/21/2022 0901     BMET    Component Value Date/Time   NA 133 (L) 01/28/2022 0706   NA 140 11/14/2021 1610   K 4.0 01/28/2022 0706   CL 99 01/28/2022 0706   CO2 28 01/28/2022 0706   GLUCOSE 94 01/28/2022 0706   BUN 18 01/28/2022 0706   BUN 9 11/14/2021 1610   CREATININE 1.02 01/28/2022 0706  CALCIUM 9.1 01/28/2022 0706   GFRNONAA >60 01/28/2022 0706   GFRAA >60 02/17/2018 1021    BNP No results found for: "BNP"   Imaging:  No results found.       Latest Ref Rng & Units 02/25/2022    3:12 PM 09/07/2021   10:48 AM 02/05/2021   10:44 AM 09/22/2020    9:50 AM 01/17/2020   10:13 AM 10/06/2019    1:49 PM  PFT Results  FVC-Pre L 1.96  2.63  2.67  2.81   2.59   FVC-Predicted Pre % 63  84  85  89  83  78   Pre FEV1/FVC % % 89  85  87  87  86  86   FEV1-Pre L 1.75  2.24  2.33  2.45  2.37  2.24   FEV1-Predicted Pre % 74  95  97  102  95  89   DLCO uncorrected ml/min/mmHg 8.46  12.24  13.45  16.91  13.79  15.70   DLCO UNC% % 31  45  49  62  48  55   DLCO corrected ml/min/mmHg 9.22  12.24  13.45  16.91  13.79  15.70   DLCO COR %Predicted % 34  45  49  62  48  55   DLVA Predicted % 68  72  80  89  81  97     No results found for: "NITRICOXIDE"      Assessment & Plan:   IPF (idiopathic pulmonary fibrosis) (HCC) IPF with acute, productive cough post EGD with esophageal dilation on 11/14. Symptoms have slightly improved today. Given he is at high risk for decompensation, recommended we obtain CXR to rule out aspiration pna with his recent procedure. If clear, possible there was some upper airway irritation or worsening reflux that contributed to the cough. He will target cough control measures. If symptoms worsen or do not continue to improve, we will consider additional treatment with steroid taper and empiric abx course.   Patient Instructions  Continue pirfenidone 2 tablets 3 times daily.  Take with food.  Wear sunscreen when outside Continue prednisone taper as previously directed.   Okay to stop Bactrim since you are below 20 mg daily. Continue Tussionex 5 mL every 12 hours as needed for cough Continue supplemental oxygen for goal >88-90%   Glad you are starting to feel better.  We will check a chest x-ray today just to ensure that you have not developed any sort of aspiration pneumonia following your procedure.  Follow-up with Dr. Chase Caller as scheduled. If symptoms do not improve or worsen, please contact office for sooner follow up or seek emergency care.    Chronic respiratory failure with hypoxia (HCC) Stable without increased O2 demand. Continue 1-2 lpm for goal >88-90%.  I spent 35 minutes of dedicated to the care of this patient on the date of this encounter to include pre-visit review of records, face-to-face time with the patient discussing conditions above, post visit ordering of testing, clinical documentation with the electronic health record, making appropriate referrals as documented, and communicating necessary findings to members of the patients care team.  Clayton Bibles, NP 03/20/2022  Pt aware and understands NP's role.

## 2022-03-20 NOTE — Assessment & Plan Note (Signed)
Stable without increased O2 demand. Continue 1-2 lpm for goal >88-90%.

## 2022-03-28 ENCOUNTER — Other Ambulatory Visit: Payer: Self-pay | Admitting: *Deleted

## 2022-03-28 NOTE — Progress Notes (Signed)
error 

## 2022-03-29 ENCOUNTER — Encounter
Payer: Medicare Other | Attending: Physical Medicine and Rehabilitation | Admitting: Physical Medicine and Rehabilitation

## 2022-03-29 DIAGNOSIS — J1282 Pneumonia due to coronavirus disease 2019: Secondary | ICD-10-CM | POA: Diagnosis not present

## 2022-03-29 DIAGNOSIS — U071 COVID-19: Secondary | ICD-10-CM | POA: Insufficient documentation

## 2022-03-29 DIAGNOSIS — R739 Hyperglycemia, unspecified: Secondary | ICD-10-CM | POA: Insufficient documentation

## 2022-03-29 DIAGNOSIS — E1165 Type 2 diabetes mellitus with hyperglycemia: Secondary | ICD-10-CM | POA: Diagnosis not present

## 2022-03-29 DIAGNOSIS — Z7984 Long term (current) use of oral hypoglycemic drugs: Secondary | ICD-10-CM

## 2022-03-29 DIAGNOSIS — E11649 Type 2 diabetes mellitus with hypoglycemia without coma: Secondary | ICD-10-CM

## 2022-03-29 MED ORDER — LIDOCAINE 5 % EX PTCH: 1.0000 | MEDICATED_PATCH | CUTANEOUS | 3 refills | Status: DC

## 2022-03-29 NOTE — Progress Notes (Signed)
   Subjective:    [name], male    DOB: June 24, 1940, 81 y.o.   MRN: [id number]  HPI An audio/video tele-health visit is felt to be the most appropriate encounter for this patient at this time. This is a follow up tele-visit via phone. The patient is at home. MD is at office. Prior to scheduling this appointment, our staff discussed the limitations of evaluation and management by telemedicine and the availability of in-person appointments. The patient expressed understanding and agreed to proceed.   1) Hypoglycemic and hyperglycemic CBGs -son has been using freestyle libre 3 which has continuous CBG trends -he notes that after dinner his CBGs spikes to 200s and then closer to bedtime it drops to 77 -son stopped metformin -he was asymptomatic when his CBG drops  2) Constantly clearing his throat- -Dr. Benson [place] stretched his esophagus to 33m  3) Debility following COVID infection -was told between 86-94% is a good goal for him -when he is at rest he sometimes does not need oxygen -he can walk around with a cane  4) Insomnia: -sleeps at 10:30 -wakes up due to back pain  5) Back pain -lidocaine patch and gabapentin stopped   Review of Systems     Objective:   Physical Exam Not performed       Assessment & Plan:  1) Type 2 diabetes with hyperglycemic and hypoglycemic episodes -recommended not treating CBGs of 62 with chocolate ice cream if patient is not symptomatic. Discussed that this will help CBGs stay lower on average.  -discussed avoiding added sugars and gluten -discussed that if CBGs decrease, we can decrease daytime metformin dose -continue use of freestyle libre 3  2) Pneumonia 2/2 COVID -placed referral for pulm rehab  3) Insomnia -continue melatonin  -discussed starting lidocaine patch  17 minutes spent in discussion of [place], starting a lidocaine patch for his back pain that is keeping him up  at night, referring to pulmonary rehab for debility, agreed with stopping metformin since CBGS are overall better controlled

## 2022-04-01 ENCOUNTER — Ambulatory Visit: Payer: Medicare Other | Admitting: Primary Care

## 2022-04-01 ENCOUNTER — Encounter: Payer: Self-pay | Admitting: Primary Care

## 2022-04-01 ENCOUNTER — Ambulatory Visit (INDEPENDENT_AMBULATORY_CARE_PROVIDER_SITE_OTHER): Payer: Medicare Other

## 2022-04-01 VITALS — BP 106/64 | HR 80 | Temp 97.6°F | Ht 67.0 in | Wt 114.0 lb

## 2022-04-01 DIAGNOSIS — J84112 Idiopathic pulmonary fibrosis: Secondary | ICD-10-CM

## 2022-04-01 DIAGNOSIS — J189 Pneumonia, unspecified organism: Secondary | ICD-10-CM

## 2022-04-01 DIAGNOSIS — R600 Localized edema: Secondary | ICD-10-CM

## 2022-04-01 DIAGNOSIS — K219 Gastro-esophageal reflux disease without esophagitis: Secondary | ICD-10-CM | POA: Diagnosis not present

## 2022-04-01 DIAGNOSIS — J9611 Chronic respiratory failure with hypoxia: Secondary | ICD-10-CM

## 2022-04-01 DIAGNOSIS — K222 Esophageal obstruction: Secondary | ICD-10-CM | POA: Insufficient documentation

## 2022-04-01 MED ORDER — AZELASTINE-FLUTICASONE 137-50 MCG/ACT NA SUSP: 1.0000 | Freq: Two times a day (BID) | NASAL | 1 refills | Status: DC

## 2022-04-01 NOTE — Progress Notes (Signed)
$'@Patient'x$  ID: Jeff Willis, male    DOB: 02/20/1941, 81 y.o.   MRN: [id number]  Chief Complaint  Patient presents with   Follow-up    Cough is worse.  Consistently clearing throat.  Swelling in feet.  Completed Augmentin.      Referring provider: Gaynelle Arabian, MD  HPI: 81 year old male, never smoked.  Past medical history significant for ILD, chronic respiratory failure, pneumonia due to COVID-19, esophageal stenosis, GERD, prostate cancer, thrombocytopenia.  Patient of Dr. Chase Caller.  04/01/2022 Patient presents today for follow-up.  She has a history of IPF recently diagnosed with community-acquired pneumonia.  Chest x-ray on 03/20/2022 showed increased left lung opacity.  He was treated with liquid Augmentin x 7 days. Cough is worse the last several days. Son reports that he has several distince coughs. No associated shortness of breath. He has new swelling in feet last 4-5 days. Constantly clearing his throat, aggressively trys to cough up mucus but cough is mainly dry. When he lays flat he does not cough.  He is compliant with Esbriet and taking prednisone every other day. No increased oxygen demands. During last office visit with Dr. Chase Caller he was referred to ENT due to cough to rule out postnasal drip as cause. He has not been contacted to schedule visit.  He had an EGD in November which showed esophageal stenosis.  Takes Protonix twice daily, needs refill.    Allergies  Allergen Reactions   Beef-Derived Products Other (See Comments)    Vegetarian   Chicken Protein Other (See Comments)    Vegetarian   Eggs Or Egg-Derived Products Other (See Comments)    Vegetarian   Fish-Derived Products Other (See Comments)    Vegetarian   Meat Extract Other (See Comments)    Vegetarian   Ofev [Nintedanib] Other (See Comments)    Weight loss   Pork-Derived Products Other (See Comments)    Vegetarian    Immunization History  Administered Date(s) Administered   Influenza,  High Dose Seasonal PF 01/28/2019, 01/12/2021, 02/11/2022   Influenza-Unspecified 01/05/2020   PFIZER(Purple Top)SARS-COV-2 Vaccination 07/23/2019, 08/17/2019   Pneumococcal-Unspecified 02/28/2015   Zoster, Live 01/26/2021    Past Medical History:  Diagnosis Date   Back pain    Elevated PSA    Essential tremor    Hypercholesteremia    Neuropathy    Prostate cancer ([place])    Sinus tachycardia 01/30/2018    Tobacco History: Social History   Tobacco Use  Smoking Status Never  Smokeless Tobacco Never   Counseling given: Not Answered   Outpatient Medications Prior to Visit  Medication Sig Dispense Refill   aspirin EC 81 MG tablet Take 81 mg by mouth daily. Swallow whole.     chlorpheniramine-HYDROcodone (TUSSIONEX) 10-8 MG/5ML Take 5 mLs by mouth every 12 (twelve) hours as needed for cough. 300 mL 0   Continuous Blood Gluc Sensor (FREESTYLE LIBRE 3 SENSOR) MISC 1 each by Does not apply route 4 (four) times daily -  before meals and at bedtime. Place 1 sensor on the skin every 14 days. Use to check glucose continuously 2 each 3   glucose blood (FREESTYLE LITE) test strip Use [facility]/HS to check CBGs 60 each 5   lidocaine (LIDODERM) 5 % Place 1 patch onto the skin daily. Remove & Discard patch within 12 hours or as directed by MD 30 patch 3   melatonin 3 MG TABS tablet Take 1 tablet (3 mg total) by mouth at bedtime as needed.  0  metFORMIN (GLUCOPHAGE) 850 MG tablet Take 1 tablet (850 mg total) by mouth 2 (two) times daily with a meal. (Patient taking differently: Take 850 mg by mouth daily with breakfast.) 60 tablet 0   Multiple Vitamin (MULTIVITAMIN WITH MINERALS) TABS tablet Take 1 tablet by mouth daily.     pantoprazole (PROTONIX) 40 MG tablet Take 1 tablet (40 mg total) by mouth 2 (two) times daily.     Pirfenidone (ESBRIET) 267 MG TABS Take 2 tablets (534 mg total) by mouth in the morning, at noon, and at bedtime. **LOW DOSE AS MAINTENANCE** 540 tablet 1   predniSONE (DELTASONE) 5  MG tablet On 11/14, take '5mg'$  dailyx2 weeks, then 11/28, take '5mg'$  Mon,Wed,Fri for 2 weeks, then 12/12 take '5mg'$  Monx2 weeks, then stop (Patient taking differently: Take 10 mg by mouth daily with breakfast. On 11/14, take '5mg'$  dailyx2 weeks, then 11/28, take '5mg'$  Mon,Wed,Fri for 2 weeks, then 12/12 take '5mg'$  Monx2 weeks, then stop) 22 tablet 0   rosuvastatin (CRESTOR) 10 MG tablet Take 10 mg by mouth at bedtime.     tamsulosin (FLOMAX) 0.4 MG CAPS capsule Take 2 capsules (0.8 mg total) by mouth daily after supper. 60 capsule 0   Vitamin D, Ergocalciferol, (DRISDOL) 1.25 MG (50000 UNIT) CAPS capsule Take 1 capsule (50,000 Units total) by mouth every 7 (seven) days. 5 capsule 0   bethanechol (URECHOLINE) 10 MG tablet Take 10 mg by mouth 2 (two) times daily. (Patient not taking: Reported on 04/01/2022)     gabapentin (NEURONTIN) 250 MG/5ML solution Take 4 mLs (200 mg total) by mouth 2 (two) times daily for 7 days, THEN 2 mLs (100 mg total) daily for 7 days, THEN 2 mLs (100 mg total) 3 (three) times a week for 7 days. (Patient not taking: Reported on 04/01/2022) 76 mL 0   metoprolol succinate (TOPROL-XL) 25 MG 24 hr tablet TAKE ONE TABLET BY MOUTH DAILY (Patient not taking: Reported on 04/01/2022) 90 tablet 0   polyethylene glycol powder (GLYCOLAX/MIRALAX) 17 GM/SCOOP powder Take 17 g by mouth 2 (two) times daily. (Patient not taking: Reported on 04/01/2022) 238 g 0   sulfamethoxazole-trimethoprim (BACTRIM DS) 800-160 MG tablet Take 1 tablet by mouth 3 (three) times a week. (Patient not taking: Reported on 03/20/2022) 12 tablet 1   No facility-administered medications prior to visit.   Review of Systems  Review of Systems  Constitutional: Negative.   Respiratory:  Positive for cough. Negative for shortness of breath.   Cardiovascular: Negative.     Physical Exam  BP 106/64 (BP Location: Left Arm, Patient Position: Sitting, Cuff Size: Normal)   Pulse 80   Temp 97.6 F (36.4 C) (Oral)   Ht '5\' 7"'$  (1.702 m)    Wt 114 lb (51.7 kg)   SpO2 96% Comment: .75 L oxygen pulsed POC  BMI 17.85 kg/m  Physical Exam Constitutional:      Appearance: Normal appearance.  HENT:     Head: Normocephalic and atraumatic.     Mouth/Throat:     Mouth: Mucous membranes are moist.     Pharynx: Oropharynx is clear.  Cardiovascular:     Rate and Rhythm: Normal rate and regular rhythm.  Pulmonary:     Effort: Pulmonary effort is normal.     Breath sounds: Rales present. No wheezing or rhonchi.     Comments: Fine rales lung bases R>L; 2L POC Musculoskeletal:        General: Normal range of motion.  Skin:    General: Skin  is warm and dry.  Neurological:     General: No focal deficit present.     Mental Status: He is alert and oriented to person, place, and time. Mental status is at baseline.  Psychiatric:        Mood and Affect: Mood normal.        Behavior: Behavior normal.        Thought Content: Thought content normal.        Judgment: Judgment normal.      Lab Results:  CBC    Component Value Date/Time   WBC 7.1 01/28/2022 0706   RBC 3.59 (L) 01/28/2022 0706   HGB 12.0 (L) 01/28/2022 0706   HGB 13.7 11/14/2021 1610   HCT 33.8 (L) 01/28/2022 0706   HCT 39.3 11/14/2021 1610   PLT 127 (L) 01/28/2022 0706   PLT 171 11/14/2021 1610   MCV 94.2 01/28/2022 0706   MCV 93 11/14/2021 1610   MCH 33.4 01/28/2022 0706   MCHC 35.5 01/28/2022 0706   RDW 13.7 01/28/2022 0706   RDW 12.2 11/14/2021 1610   LYMPHSABS 1.6 01/21/2022 0901   MONOABS 0.4 01/21/2022 0901   EOSABS 0.3 01/21/2022 0901   BASOSABS 0.0 01/21/2022 0901    BMET    Component Value Date/Time   NA 133 (L) 01/28/2022 0706   NA 140 11/14/2021 1610   K 4.0 01/28/2022 0706   CL 99 01/28/2022 0706   CO2 28 01/28/2022 0706   GLUCOSE 94 01/28/2022 0706   BUN 18 01/28/2022 0706   BUN 9 11/14/2021 1610   CREATININE 1.02 01/28/2022 0706   CALCIUM 9.1 01/28/2022 0706   GFRNONAA >60 01/28/2022 0706   GFRAA >60 02/17/2018 1021     BNP No results found for: "BNP"  ProBNP    Component Value Date/Time   PROBNP 211.0 (H) 09/07/2021 1218    Imaging: DG Chest 2 View  Result Date: 04/01/2022 CLINICAL DATA:  Pneumonia EXAM: CHEST - 2 VIEW COMPARISON:  03/20/2022 FINDINGS: The heart size and mediastinal contours are within normal limits. No significant change in diffuse bilateral interstitial and heterogeneous airspace opacity throughout the lungs. The visualized skeletal structures are unremarkable. IMPRESSION: No significant change in diffuse bilateral interstitial and heterogeneous airspace opacity throughout the lungs, consistent with multifocal infection. No new airspace opacity. Electronically Signed   By: Delanna Ahmadi M.D.   On: 04/01/2022 17:20   DG Chest 2 View  Result Date: 03/20/2022 CLINICAL DATA:  Productive cough, post endoscopy. EXAM: CHEST - 2 VIEW COMPARISON:  CT examination dated January 08, 2022 and radiographs dated January 22, 2022. FINDINGS: The heart size and mediastinal contours are within normal limits. Bilateral reticular opacities, left worse than the right, suggesting chronic interstitial lung disease. Interval increase in hazy opacities of the left lung concerning for acute infectious/inflammatory process in the background of chronic interstitial lung disease. Follow-up examination to resolution is recommended. Thoracic spondylosis. No acute osseous abnormality IMPRESSION: Bilateral reticular opacities, left worse than the right, suggesting chronic interstitial lung disease. Interval increase in hazy opacities of the left lung concerning for acute infectious/inflammatory process in the background of chronic interstitial lung disease. Follow-up examination to resolution is recommended. Electronically Signed   By: Keane Police D.O.   On: 03/20/2022 12:01     Assessment & Plan:   IPF (idiopathic pulmonary fibrosis) ([place]) - Patient has IPF on Esbriet. He was diagnosed with community-acquired  pneumonia after having a chest x-ray on 03/20/2022 that showed increased left lung opacity.  He was treated with Augmentin x 7 days.  Cough is worse last several days, more consistent with upper airway cough.  No mucus production or shortness of breath.  No increased oxygen demands.  Start Dymista 1 spray per nostril twice daily and chlorphentermine 4 mg tablet every 4-6 hours for cough suppression (not to use with Tussionex).  Advised patient take sips of water and use sugar-free throat lozenges to prevent throat clearing.   Chronic respiratory failure with hypoxia (HCC) - Stable; continue 2 to 3 L supplemental oxygen to maintain O2 greater than 88 to 90%  Laryngopharyngeal reflux (LPR) - Continue Protonix twice daily  Esophageal stenosis - Patient had EGD with dilation in November 2023, repeat as needed for symptoms of dysphagia   Pedal edema - New; +1 pitting pedal edema x 3-4 days.  No associated shortness of breath. - Checking BNP and CMET    Martyn Ehrich, NP 04/01/2022

## 2022-04-01 NOTE — Assessment & Plan Note (Addendum)
-   Patient has IPF on Esbriet. He was diagnosed with community-acquired pneumonia after having a chest x-ray on 03/20/2022 that showed increased left lung opacity.  He was treated with Augmentin x 7 days.  Cough is worse last several days, more consistent with upper airway cough.  No mucus production or shortness of breath.  No increased oxygen demands.  Start Dymista 1 spray per nostril twice daily and chlorphentermine 4 mg tablet every 4-6 hours for cough suppression (not to use with Tussionex).  Advised patient take sips of water and use sugar-free throat lozenges to prevent throat clearing.

## 2022-04-01 NOTE — Assessment & Plan Note (Signed)
-   New; +1 pitting pedal edema x 3-4 days.  No associated shortness of breath. - Checking BNP and CMET

## 2022-04-01 NOTE — Assessment & Plan Note (Signed)
-   Patient had EGD with dilation in November 2023, repeat as needed for symptoms of dysphagia

## 2022-04-01 NOTE — Patient Instructions (Addendum)
Recommendations: Start daily nasal spray called Dymista Start chlorpheniramine  tablets over the counter - take 4 mg tablet every 4-6 hours as needed for cough (do not take with tussionex) Take sips of water and use sugar-free lozenge anytime you feel the need to cough or clear your throat We will re-refer you to ENT  Continue Esbriet and prednisone  CXR today Please return to office sometime this week for labs  Orders Chest x-ray today CMET and BNP this week  RX: Fill Protonix  Follow-up: 2 to 3 months with Dr. Chase Caller, 30-minute appointment ILD clinic

## 2022-04-01 NOTE — Assessment & Plan Note (Signed)
-   Stable; continue 2 to 3 L supplemental oxygen to maintain O2 greater than 88 to 90%

## 2022-04-01 NOTE — Assessment & Plan Note (Addendum)
-   Continue Protonix twice daily

## 2022-04-02 ENCOUNTER — Telehealth: Payer: Self-pay

## 2022-04-02 ENCOUNTER — Other Ambulatory Visit (INDEPENDENT_AMBULATORY_CARE_PROVIDER_SITE_OTHER): Payer: Medicare Other

## 2022-04-02 DIAGNOSIS — J189 Pneumonia, unspecified organism: Secondary | ICD-10-CM

## 2022-04-02 LAB — COMPREHENSIVE METABOLIC PANEL
ALT: 10 U/L (ref 0–53)
AST: 16 U/L (ref 0–37)
Albumin: 3.3 g/dL — ABNORMAL LOW (ref 3.5–5.2)
Alkaline Phosphatase: 40 U/L (ref 39–117)
BUN: 9 mg/dL (ref 6–23)
CO2: 31 mEq/L (ref 19–32)
Calcium: 9.4 mg/dL (ref 8.4–10.5)
Chloride: 100 mEq/L (ref 96–112)
Creatinine, Ser: 0.79 mg/dL (ref 0.40–1.50)
GFR: 83.76 mL/min (ref >60.00)
Glucose, Bld: 137 mg/dL — ABNORMAL HIGH (ref 70–99)
Potassium: 3.7 mEq/L (ref 3.5–5.1)
Sodium: 136 mEq/L (ref 135–145)
Total Bilirubin: 0.4 mg/dL (ref 0.2–1.2)
Total Protein: 7 g/dL (ref 6.0–8.3)

## 2022-04-02 LAB — CBC WITH DIFFERENTIAL/PLATELET
Basophils Absolute: 0 10*3/uL (ref 0.0–0.1)
Basophils Relative: 0.3 % (ref 0.0–3.0)
Eosinophils Absolute: 0.2 10*3/uL (ref 0.0–0.7)
Eosinophils Relative: 2.3 % (ref 0.0–5.0)
HCT: 35.2 % — ABNORMAL LOW (ref 39.0–52.0)
Hemoglobin: 12.2 g/dL — ABNORMAL LOW (ref 13.0–17.0)
Lymphocytes Relative: 35.5 % (ref 12.0–46.0)
Lymphs Abs: 3.5 10*3/uL (ref 0.7–4.0)
MCHC: 34.6 g/dL (ref 30.0–36.0)
MCV: 95.3 fl (ref 78.0–100.0)
Monocytes Absolute: 1 10*3/uL (ref 0.1–1.0)
Monocytes Relative: 10.6 % (ref 3.0–12.0)
Neutro Abs: 5 10*3/uL (ref 1.4–7.7)
Neutrophils Relative %: 51.3 % (ref 43.0–77.0)
Platelets: 229 10*3/uL (ref 150.0–400.0)
RBC: 3.7 Mil/uL — ABNORMAL LOW (ref 4.22–5.81)
RDW: 14.2 % (ref 11.5–15.5)
WBC: 9.8 10*3/uL (ref 4.0–10.5)

## 2022-04-02 LAB — BRAIN NATRIURETIC PEPTIDE: Pro B Natriuretic peptide (BNP): 199 pg/mL — ABNORMAL HIGH (ref 0.0–100.0)

## 2022-04-02 NOTE — Telephone Encounter (Signed)
Pt stopped by to get labs after which he stop to make an appt, the son is also needing the script changed for the dymista an says pharm didn't get script for the protonix.Jeff Willis

## 2022-04-02 NOTE — Progress Notes (Signed)
Chest x-ray showed no significant change in bilateral interstitial airspace opacity throughout the lungs consistent with known pneumonia.  If able to cough up sputum recommend checking respiratory culture. Please refill Augmentin liquid for additional 7 days. and needs HRCT high-resolution in 2-4 weeks re:ILD (please order).

## 2022-04-02 NOTE — Telephone Encounter (Signed)
PA submitted for Lidocaine patches

## 2022-04-03 ENCOUNTER — Other Ambulatory Visit: Payer: Self-pay | Admitting: Internal Medicine

## 2022-04-03 MED ORDER — PANTOPRAZOLE SODIUM 40 MG PO TBEC: 40.0000 mg | DELAYED_RELEASE_TABLET | Freq: Two times a day (BID) | ORAL | 2 refills | Status: AC

## 2022-04-03 MED ORDER — AZELASTINE HCL 0.1 % NA SOLN: 1.0000 | Freq: Two times a day (BID) | NASAL | 1 refills | Status: DC

## 2022-04-03 MED ORDER — FLUTICASONE PROPIONATE 50 MCG/ACT NA SUSP: 1.0000 | Freq: Two times a day (BID) | NASAL | 1 refills | Status: DC

## 2022-04-03 NOTE — Telephone Encounter (Signed)
Pantoprazole has been sent to pharmacy for pt. Dymista Rx had also been sent to pharmacy for pt.   MR, please advise on the gabapentin Rx if pt is okay to continue on it and if so, what instructions.

## 2022-04-03 NOTE — Progress Notes (Signed)
BNP was elevated, refer to cardiology. Echocardiogram in June showed grade 1 diastolic dysfunction or heart failure. He should wear compression stockings. Can consider adding diuretics but will hold off for now since he wasn't having much shortness of breath except for cough d/t ILD/pneumonia

## 2022-04-04 NOTE — Telephone Encounter (Signed)
Approved through 04/03/23

## 2022-04-08 ENCOUNTER — Telehealth (HOSPITAL_COMMUNITY): Payer: Self-pay

## 2022-04-08 ENCOUNTER — Other Ambulatory Visit: Payer: Self-pay | Admitting: *Deleted

## 2022-04-08 DIAGNOSIS — J84112 Idiopathic pulmonary fibrosis: Secondary | ICD-10-CM

## 2022-04-08 DIAGNOSIS — R053 Chronic cough: Secondary | ICD-10-CM

## 2022-04-08 MED ORDER — AMOXICILLIN-POT CLAVULANATE 400-57 MG/5ML PO SUSR: 875.0000 mg | Freq: Two times a day (BID) | ORAL | 0 refills | Status: DC

## 2022-04-08 NOTE — Telephone Encounter (Signed)
Called and spoke with pt son [name] to see if pt is interested in scheduling for pulmonary rehab. Pt's son stated that pt is dealing with a cough right now but he is interested in the cardiac rehab and will call back to schedule at a later time.

## 2022-04-08 NOTE — Telephone Encounter (Signed)
Pt insurance is active and benefits verified through [employer] $20, DED 0/0 met, out of pocket $3,950/$3,950 met, co-insurance 0%. no pre-authorization required, Mark/BCBS 04/08/2022_0 :11pm, REF# [id number]

## 2022-04-10 NOTE — Progress Notes (Unsigned)
Office Visit    Patient Name: MASAYOSHI COUZENS Date of Encounter: 04/11/2022  Primary Care Provider:  Gaynelle Arabian, MD Primary Cardiologist:  None Primary Electrophysiologist: None  Chief Complaint    Kalub OZZY BOHLKEN is a 81 y.o. male with PMH of mild CAD, HLD, ascending aortic aneurysm, GERD, idiopathic pulmonary fibrosis who presents today for elevation of  BNP.  Past Medical History    Past Medical History:  Diagnosis Date   Back pain    Elevated PSA    Essential tremor    Hypercholesteremia    Neuropathy    Prostate cancer ([place])    Sinus tachycardia 01/30/2018   Past Surgical History:  Procedure Laterality Date   COLONOSCOPY WITH PROPOFOL N/A 08/10/2021   Procedure: COLONOSCOPY WITH PROPOFOL;  Surgeon: Carol Ada, MD;  Location: [facility];  Service: Gastroenterology;  Laterality: N/A;   ESOPHAGEAL DILATION  08/10/2021   Procedure: ESOPHAGEAL DILATION;  Surgeon: Carol Ada, MD;  Location: [facility];  Service: Gastroenterology;;   ESOPHAGOGASTRODUODENOSCOPY (EGD) WITH PROPOFOL N/A 08/10/2021   Procedure: ESOPHAGOGASTRODUODENOSCOPY (EGD) WITH PROPOFOL;  Surgeon: Carol Ada, MD;  Location: [facility];  Service: Gastroenterology;  Laterality: N/A;   ESOPHAGOGASTRODUODENOSCOPY (EGD) WITH PROPOFOL N/A 03/12/2022   Procedure: ESOPHAGOGASTRODUODENOSCOPY (EGD) WITH PROPOFOL;  Surgeon: Carol Ada, MD;  Location: [facility];  Service: Gastroenterology;  Laterality: N/A;   HEMOSTASIS CLIP PLACEMENT  08/10/2021   Procedure: HEMOSTASIS CLIP PLACEMENT;  Surgeon: Carol Ada, MD;  Location: [facility];  Service: Gastroenterology;;   LUMBAR MICRODISCECTOMY     L4-5   POLYPECTOMY  08/10/2021   Procedure: POLYPECTOMY;  Surgeon: Carol Ada, MD;  Location: [facility];  Service: Gastroenterology;;   RADIOACTIVE SEED IMPLANT N/A 02/24/2018   Procedure: RADIOACTIVE SEED IMPLANT/BRACHYTHERAPY IMPLANT;  Surgeon: Festus Aloe, MD;  Location[facility];  Service: Urology;  Laterality: N/A;   RIGHT HEART CATH N/A 11/22/2021   Procedure: RIGHT HEART CATH;  Surgeon: Troy Sine, MD;  Location: [facility];  Service: Cardiovascular;  Laterality: N/A;   SAVORY DILATION N/A 03/12/2022   Procedure: SAVORY DILATION;  Surgeon: Carol Ada, MD;  Location: [facility];  Service: Gastroenterology;  Laterality: N/A;   SPACE OAR INSTILLATION N/A 02/24/2018   Procedure: SPACE OAR INSTILLATION;  Surgeon: Festus Aloe, MD;  Location: [facility];  Service: Urology;  Laterality: N/A;    Allergies  Allergies  Allergen Reactions   Beef-Derived Products Other (See Comments)    Vegetarian   Chicken Protein Other (See Comments)    Vegetarian   Eggs Or Egg-Derived Products Other (See Comments)    Vegetarian   Fish-Derived Products Other (See Comments)    Vegetarian   Meat Extract Other (See Comments)    Vegetarian   Ofev [Nintedanib] Other (See Comments)    Weight loss   Pork-Derived Products Other (See Comments)    Vegetarian    History of Present Illness    Ruven DELANTE KARAPETYAN  is a 81 year old male with the above mention past medical history who presents today for elevation of BNP.  Mr. Dragoo was initially seen by Dr. Claiborne Billings in 11/2020 for complaint of sinus tachycardia.  2D echo was completed showing EF of 60 to 65%, with no RWMA, normal RV function, mildly elevated pulmonary artery systolic pressure with mild to moderate tricuspid valve regurgitation.  Patient had a high-resolution CT scan completed in 2021 that revealed progressive interstitial lung disease with aortic atherosclerosis mild dilation of ascending thoracic  aorta.  There was also two-vessel CAD noted as well.  He was sent for repeat coronary CTA revealed a calcium score of 18 with mild atherosclerotic plaque in thoracic aorta of 42 mm.  He was referred to cardiothoracic surgery for evaluation.  He was also started on rosuvastatin 10  mg daily.  Good blood pressure control was recommended and beta-blocker therapy continue per Dr. Claiborne Billings.  He underwent RHC due to elevated systolic RV pressure.  He was admitted to the ED on 12/2021 with acute hypoxic respiratory failure secondary to COVID 19.  He required intubation for respiratory failure and was extubated 3 days later.  He was seen recently by pulmonology APP for follow-up of community-acquired pneumonia secondary to COVID infection.  He is noticed swelling in his feet for the past 4 to 5 days and BNP was completed showing elevation of 199.  Mr. Sava presents today for lower extremity swelling with his son.  Since last being seen in the office patient reports that he has been doing well from a cardiac perspective but has noted lower extremity swelling that is worse in the evenings and relieved with elevation and compression stockings.  On examination today he is euvolemic with no complaints of shortness of breath or dyspnea noted.  His blood pressure today is well-controlled at 102/60 and heart rate was 92 bpm.  He is currently compliant with his medications and denies any adverse reactions.  During our visit we discussed the pathophysiology of aortic aneurysms and discussed his most recent CT scan results.  We also discussed the pathophysiology of congestive heart failure in particular right heart failure. Patient denies chest pain, palpitations, dyspnea, PND, orthopnea, nausea, vomiting, dizziness, syncope, edema, weight gain, or early satiety.  Home Medications    Current Outpatient Medications  Medication Sig Dispense Refill   amoxicillin-clavulanate (AUGMENTIN) 400-57 MG/5ML suspension Take 10.9 mLs (875 mg total) by mouth 2 (two) times daily. 160 mL 0   aspirin EC 81 MG tablet Take 81 mg by mouth daily. Swallow whole.     azelastine (ASTELIN) 0.1 % nasal spray Place 1 spray into both nostrils 2 (two) times daily. Use in each nostril as directed 30 mL 1   bethanechol  (URECHOLINE) 10 MG tablet Take 10 mg by mouth 2 (two) times daily.     chlorpheniramine-HYDROcodone (TUSSIONEX) 10-8 MG/5ML Take 5 mLs by mouth every 12 (twelve) hours as needed for cough. 300 mL 0   fluticasone (FLONASE) 50 MCG/ACT nasal spray Place 1 spray into both nostrils in the morning and at bedtime. 16 g 1   furosemide (LASIX) 20 MG tablet Take 1 tablet (20 mg total) by mouth daily as needed for edema. 30 tablet 1   glucose blood (FREESTYLE LITE) test strip Use ACH/HS to check CBGs 60 each 5   lidocaine (LIDODERM) 5 % Place 1 patch onto the skin daily. Remove & Discard patch within 12 hours or as directed by MD 30 patch 3   melatonin 3 MG TABS tablet Take 1 tablet (3 mg total) by mouth at bedtime as needed.  0   metoprolol succinate (TOPROL-XL) 25 MG 24 hr tablet TAKE ONE TABLET BY MOUTH DAILY 90 tablet 0   Multiple Vitamin (MULTIVITAMIN WITH MINERALS) TABS tablet Take 1 tablet by mouth daily.     pantoprazole (PROTONIX) 40 MG tablet Take 1 tablet (40 mg total) by mouth 2 (two) times daily. 30 tablet 2   Pirfenidone 267 MG CAPS Take 534 mg by mouth in the  morning, at noon, and at bedtime.     predniSONE (DELTASONE) 5 MG tablet On 11/14, take '5mg'$  dailyx2 weeks, then 11/28, take '5mg'$  Mon,Wed,Fri for 2 weeks, then 12/12 take '5mg'$  Monx2 weeks, then stop (Patient taking differently: Take 10 mg by mouth daily with breakfast. On 11/14, take '5mg'$  dailyx2 weeks, then 11/28, take '5mg'$  Mon,Wed,Fri for 2 weeks, then 12/12 take '5mg'$  Monx2 weeks, then stop) 22 tablet 0   rosuvastatin (CRESTOR) 10 MG tablet Take 10 mg by mouth at bedtime.     tamsulosin (FLOMAX) 0.4 MG CAPS capsule Take 2 capsules (0.8 mg total) by mouth daily after supper. 60 capsule 0   Vitamin D, Ergocalciferol, (DRISDOL) 1.25 MG (50000 UNIT) CAPS capsule Take 1 capsule (50,000 Units total) by mouth every 7 (seven) days. 5 capsule 0   Azelastine-Fluticasone 137-50 MCG/ACT SUSP Place 1 spray into the nose in the morning and at bedtime. (Patient  not taking: Reported on 04/11/2022) 23 g 1   gabapentin (NEURONTIN) 250 MG/5ML solution Take 4 mLs (200 mg total) by mouth 2 (two) times daily for 7 days, THEN 2 mLs (100 mg total) daily for 7 days, THEN 2 mLs (100 mg total) 3 (three) times a week for 7 days. (Patient not taking: Reported on 04/01/2022) 76 mL 0   No current facility-administered medications for this visit.     Review of Systems  Please see the history of present illness.    (+) Bilateral lower extremity swelling (+) Chronic shortness of breath with activity  All other systems reviewed and are otherwise negative except as noted above.  Physical Exam    Wt Readings from Last 3 Encounters:  04/11/22 115 lb (52.2 kg)  04/01/22 114 lb (51.7 kg)  03/20/22 114 lb 9.6 oz (52 kg)   VS: Vitals:   04/11/22 1006  BP: 102/60  Pulse: 92  SpO2: 97%  ,Body mass index is 18.01 kg/m.  Constitutional:      Appearance: Healthy appearance. Not in distress.  Neck:     Vascular: JVD normal.  Pulmonary:     Effort: Pulmonary effort is normal.     Breath sounds: No wheezing. No rales. Diminished in the bases Cardiovascular:     Normal rate. Regular rhythm. Normal S1. Normal S2.      Murmurs: There is no murmur.  Edema:    Peripheral edema +1 in right and trace in  left lower extremity Abdominal:     Palpations: Abdomen is soft non tender. There is no hepatomegaly.  Skin:    General: Skin is warm and dry.  Neurological:     General: No focal deficit present.     Mental Status: Alert and oriented to person, place and time.     Cranial Nerves: Cranial nerves are intact.  EKG/LABS/Other Studies Reviewed    ECG personally reviewed by me today -none completed today  Lab Results  Component Value Date   WBC 9.8 04/02/2022   HGB 12.2 (L) 04/02/2022   HCT 35.2 (L) 04/02/2022   MCV 95.3 04/02/2022   PLT 229.0 04/02/2022   Lab Results  Component Value Date   CREATININE 0.79 04/02/2022   BUN 9 04/02/2022   NA 136 04/02/2022    K 3.7 04/02/2022   CL 100 04/02/2022   CO2 31 04/02/2022   Lab Results  Component Value Date   ALT 10 04/02/2022   AST 16 04/02/2022   ALKPHOS 40 04/02/2022   BILITOT 0.4 04/02/2022   Lab Results  Component  Value Date   CHOL 203 (H) 11/30/2020   HDL 60 11/30/2020   LDLCALC 129 (H) 11/30/2020   TRIG 70 01/07/2022   CHOLHDL 3.4 11/30/2020    Lab Results  Component Value Date   HGBA1C 5.8 (H) 01/01/2022    Assessment & Plan    1.  Bilateral lower extremity edema: -Patient had a recent BNP completed by pulmonology that was elevated. -Today on examination patient was euvolemic and had no complaints of orthopnea or shortness of breath.-He did have +1 lower extremity in the right lower leg and trace edema in his left lower leg. -Elevated BNP results possibly related to pulmonary fibrosis. -He was encouraged to continue elevation and compression stockings as currently completed. -Lasix 20 mg as needed for lower extremity swelling  2.  History of tachycardia: -Today patient denies any celebrated arrhythmias or palpitations. -Continue Toprol 25 mg daily  3.  Nonobstructive CAD: -s/p cardiac CTA completed that revealed calcium score of 18 with mild atherosclerotic plaque  -Patient denies any chest pain or anginal equivalent today -Continue current GDMT with Crestor 10 mg, ASA 81 mg, and Toprol as noted above  4.  Ascending aortic aneurysm: -Most recent cardiac CT completed 12/2021 revealing thoracic aneurysm of 42 mm -Continue strict BP control and abstain from excess straining or lifting of heavy objects    Cardiac Rehabilitation Eligibility Assessment  The patient is ready to start cardiac rehabilitation from a cardiac standpoint.     Disposition: Follow-up with None or APP in 6 months   Medication Adjustments/Labs and Tests Ordered: Current medicines are reviewed at length with the patient today.  Concerns regarding medicines are outlined above.   Signed, Mable Fill,  Marissa Nestle, NP 04/11/2022, 12:16 PM [facility]  Note:  This document was prepared using Dragon voice recognition software and may include unintentional dictation errors.

## 2022-04-11 ENCOUNTER — Ambulatory Visit: Payer: Medicare Other | Attending: Nurse Practitioner | Admitting: Nurse Practitioner

## 2022-04-11 ENCOUNTER — Encounter: Payer: Self-pay | Admitting: Nurse Practitioner

## 2022-04-11 VITALS — BP 102/60 | HR 92 | Ht 67.0 in | Wt 115.0 lb

## 2022-04-11 DIAGNOSIS — R600 Localized edema: Secondary | ICD-10-CM

## 2022-04-11 DIAGNOSIS — I7121 Aneurysm of the ascending aorta, without rupture: Secondary | ICD-10-CM | POA: Diagnosis not present

## 2022-04-11 DIAGNOSIS — Z8679 Personal history of other diseases of the circulatory system: Secondary | ICD-10-CM | POA: Diagnosis not present

## 2022-04-11 DIAGNOSIS — I2510 Atherosclerotic heart disease of native coronary artery without angina pectoris: Secondary | ICD-10-CM | POA: Diagnosis not present

## 2022-04-11 MED ORDER — FUROSEMIDE 20 MG PO TABS: 20.0000 mg | ORAL_TABLET | Freq: Every day | ORAL | 1 refills | Status: DC | PRN

## 2022-04-11 NOTE — Patient Instructions (Addendum)
Medication Instructions:  You can take '20mg'$  of Lasix for lower extremity swelling *If you need a refill on your cardiac medications before your next appointment, please call your pharmacy*  Lab Work: None ordered  Testing/Procedures: None ordered  Follow-Up: At [facility], you and your health needs are our priority.  As part of our continuing mission to provide you with exceptional heart care, we have created designated Provider Care Teams.  These Care Teams include your primary Cardiologist (physician) and Advanced Practice Providers (APPs -  Physician Assistants and Nurse Practitioners) who all work together to provide you with the care you need, when you need it.  We recommend signing up for the patient portal called "MyChart".  Sign up information is provided on this After Visit Summary.  MyChart is used to connect with patients for Virtual Visits (Telemedicine).  Patients are able to view lab/test results, encounter notes, upcoming appointments, etc.  Non-urgent messages can be sent to your provider as well.   To learn more about what you can do with MyChart, go to [email or web address].    Your next appointment:   6 month(s)  The format for your next appointment:   In Person  Provider:   Troy Sine, MD Other Instructions   Important Information About Sugar

## 2022-04-13 ENCOUNTER — Other Ambulatory Visit: Payer: Self-pay | Admitting: Cardiovascular Disease

## 2022-04-15 ENCOUNTER — Encounter (HOSPITAL_COMMUNITY)
Admission: RE | Admit: 2022-04-15 | Discharge: 2022-04-15 | Disposition: A | Discharge status: Home / Self Care | Payer: Medicare Other | Source: Ambulatory Visit | Attending: Physical Medicine and Rehabilitation | Admitting: Physical Medicine and Rehabilitation

## 2022-04-15 ENCOUNTER — Encounter (HOSPITAL_COMMUNITY): Payer: Self-pay

## 2022-04-15 VITALS — BP 124/62 | HR 76 | Wt 126.1 lb

## 2022-04-15 DIAGNOSIS — J849 Interstitial pulmonary disease, unspecified: Secondary | ICD-10-CM | POA: Diagnosis present

## 2022-04-15 NOTE — Progress Notes (Signed)
Jeff Willis 81 y.o. male Pulmonary Rehab Orientation Note This patient who was referred to Pulmonary Rehab by Dr. Ranell Patrick with the diagnosis of ILD arrived today in Cardiac and Pulmonary Rehab. He arrived ambulatory with assistive device with normal gait. He does carry portable oxygen. [employer] is the provider for their DME. Per patient, Jeff Willis uses oxygen continuously. Color good, skin warm and dry. Patient is oriented to time and place. Patient's medical history, psychosocial health, and medications reviewed. Psychosocial assessment reveals patient lives with family. Jeff Willis is currently retired. Patient hobbies include spending time with others. Patient reports his stress level is low. Areas of stress/anxiety include health. Patient does not exhibit signs of depression. PHQ2/9 score 0/0. Jeff Willis shows good  coping skills with positive outlook on life. Offered emotional support and reassurance. Will continue to monitor. Physical assessment performed by Jeff Dredge RN. Please see their orientation physical assessment note. Jeff Willis reports he  does take medications as prescribed. Patient states he  follows a lactovegetarian diet. Patient wants to gain weight. Patient's weight will be monitored closely. Demonstration and practice of PLB using pulse oximeter. Jeff Willis able to return demonstration satisfactorily. Safety and hand hygiene in the exercise area reviewed with patient. Jeff Willis voices understanding of the information reviewed. Department expectations discussed with patient and achievable goals were set. The patient shows enthusiasm about attending the program and we look forward to working with Jeff Willis. Jeff Willis completed a 6 min walk test today and is scheduled to begin exercise on 04/23/22 at 1:15 pm.   [id number] Sheppard Plumber, MS, ACSM-CEP

## 2022-04-15 NOTE — Progress Notes (Signed)
Pulmonary Rehab Orientation Physical Assessment Note  Physical assessment reveals  Pt is alert and oriented.  Breath sounds diminished left lower posterior lobe otherwise clear to auscultation with no wheezes, rales, or rhonchi. Reports that he does have a cough with occasional productive cough that is clear. Pt denies abdominal discomfort, nausea, vomiting or diarrhea. Reports he does not have much of an appetite. Grip strength equal, strong. No swelling to lower extremities.

## 2022-04-15 NOTE — Progress Notes (Signed)
Pulmonary Individual Treatment Plan  Patient Details  Name: Jeff Willis MRN: [id number] Date of Birth: 1941/03/02 Referring Provider:   April Manson Pulmonary Rehab Walk Test from 04/15/2022 in [facility] [place]  Referring Provider [name]       Initial Encounter Date:  Flowsheet Row Pulmonary Rehab Walk Test from 04/15/2022 in [facility] [place]  Date 04/15/22       Visit Diagnosis: ILD (interstitial lung disease) ([place])  Patient's Home Medications on Admission:   Current Outpatient Medications:    amoxicillin-clavulanate (AUGMENTIN) 400-57 MG/5ML suspension, Take 10.9 mLs (875 mg total) by mouth 2 (two) times daily., Disp: 160 mL, Rfl: 0   aspirin EC 81 MG tablet, Take 81 mg by mouth daily. Swallow whole., Disp: , Rfl:    azelastine (ASTELIN) 0.1 % nasal spray, Place 1 spray into both nostrils 2 (two) times daily. Use in each nostril as directed, Disp: 30 mL, Rfl: 1   chlorpheniramine-HYDROcodone (TUSSIONEX) 10-8 MG/5ML, Take 5 mLs by mouth every 12 (twelve) hours as needed for cough., Disp: 300 mL, Rfl: 0   fluticasone (FLONASE) 50 MCG/ACT nasal spray, Place 1 spray into both nostrils in the morning and at bedtime., Disp: 16 g, Rfl: 1   furosemide (LASIX) 20 MG tablet, Take 1 tablet (20 mg total) by mouth daily as needed for edema., Disp: 30 tablet, Rfl: 1   lidocaine (LIDODERM) 5 %, Place 1 patch onto the skin daily. Remove & Discard patch within 12 hours or as directed by MD, Disp: 30 patch, Rfl: 3   melatonin 3 MG TABS tablet, Take 1 tablet (3 mg total) by mouth at bedtime as needed., Disp: , Rfl: 0   metoprolol succinate (TOPROL-XL) 25 MG 24 hr tablet, TAKE ONE TABLET BY MOUTH DAILY, Disp: 90 tablet, Rfl: 0   Multiple Vitamin (MULTIVITAMIN WITH MINERALS) TABS tablet, Take 1 tablet by mouth daily., Disp: , Rfl:    pantoprazole (PROTONIX) 40 MG tablet, Take 1 tablet  (40 mg total) by mouth 2 (two) times daily., Disp: 30 tablet, Rfl: 2   Pirfenidone 267 MG CAPS, Take 534 mg by mouth in the morning, at noon, and at bedtime., Disp: , Rfl:    predniSONE (DELTASONE) 5 MG tablet, On 11/14, take 18m dailyx2 weeks, then 11/28, take 538mMon,Wed,Fri for 2 weeks, then 12/12 take 70m59monx2 weeks, then stop (Patient taking differently: Take 10 mg by mouth daily with breakfast. On 11/14, take 70mg49milyx2 weeks, then 11/28, take 70mg 42m,Wed,Fri for 2 weeks, then 12/12 take 70mg M84m2 weeks, then stop), Disp: 22 tablet, Rfl: 0   Vitamin D, Ergocalciferol, (DRISDOL) 1.25 MG (50000 UNIT) CAPS capsule, Take 1 capsule (50,000 Units total) by mouth every 7 (seven) days., Disp: 5 capsule, Rfl: 0   Azelastine-Fluticasone 137-50 MCG/ACT SUSP, Place 1 spray into the nose in the morning and at bedtime. (Patient not taking: Reported on 04/11/2022), Disp: 23 g, Rfl: 1   bethanechol (URECHOLINE) 10 MG tablet, Take 10 mg by mouth 2 (two) times daily. (Patient not taking: Reported on 04/15/2022), Disp: , Rfl:    gabapentin (NEURONTIN) 250 MG/5ML solution, Take 4 mLs (200 mg total) by mouth 2 (two) times daily for 7 days, THEN 2 mLs (100 mg total) daily for 7 days, THEN 2 mLs (100 mg total) 3 (three) times a week for 7 days. (Patient not taking: Reported on 04/01/2022), Disp: 76 mL, Rfl: 0  glucose blood (FREESTYLE LITE) test strip, Use [facility]/HS to check CBGs (Patient not taking: Reported on 04/15/2022), Disp: 60 each, Rfl: 5   rosuvastatin (CRESTOR) 10 MG tablet, TAKE ONE TABLET BY MOUTH DAILY, Disp: 90 tablet, Rfl: 3   tamsulosin (FLOMAX) 0.4 MG CAPS capsule, Take 2 capsules (0.8 mg total) by mouth daily after supper. (Patient not taking: Reported on 04/15/2022), Disp: 60 capsule, Rfl: 0  Past Medical History: Past Medical History:  Diagnosis Date   Back pain    Elevated PSA    Essential tremor    Hypercholesteremia    Neuropathy    Prostate cancer ([place])    Sinus tachycardia 01/30/2018     Tobacco Use: Social History   Tobacco Use  Smoking Status Never  Smokeless Tobacco Never    Labs: Review Flowsheet  More data exists      Latest Ref Rng & Units 01/01/2022 01/02/2022 01/03/2022 01/04/2022 01/07/2022  Labs for ITP Cardiac and Pulmonary Rehab  Trlycerides <150 mg/dL - - - 36  70   Hemoglobin A1c 4.8 - 5.6 % 5.8  - - - -  PH, Arterial 7.35 - 7.45 7.374  7.277  7.256  7.366  7.362  7.428  - -  PCO2 arterial 32 - 48 mmHg 50.2  72.1  72.5  52.8  55.6  49.0  - -  Bicarbonate 20.0 - 28.0 mmol/L 29.5  33.9  32.6  30.5  31.7  32.5  - -  TCO2 22 - 32 mmol/L 31  36  35  32  33  34  - -  O2 Saturation % 98  100  100  99  91  99  - -    Capillary Blood Glucose: Lab Results  Component Value Date   GLUCAP 115 (H) 03/12/2022   GLUCAP 76 01/28/2022   GLUCAP 174 (H) 01/27/2022   GLUCAP 151 (H) 01/27/2022   GLUCAP 115 (H) 01/27/2022     Pulmonary Assessment Scores:  Pulmonary Assessment Scores     Row Name 04/15/22 1111         ADL UCSD   ADL Phase Entry     SOB Score total 71       CAT Score   CAT Score 11       mMRC Score   mMRC Score 0             UCSD: Self-administered rating of dyspnea associated with activities of daily living (ADLs) 6-point scale (0 = "not at all" to 5 = "maximal or unable to do because of breathlessness")  Scoring Scores range from 0 to 120.  Minimally important difference is 5 units  CAT: CAT can identify the health impairment of COPD patients and is better correlated with disease progression.  CAT has a scoring range of zero to 40. The CAT score is classified into four groups of low (less than 10), medium (10 - 20), high (21-30) and very high (31-40) based on the impact level of disease on health status. A CAT score over 10 suggests significant symptoms.  A worsening CAT score could be explained by an exacerbation, poor medication adherence, poor inhaler technique, or progression of COPD or comorbid conditions.  CAT MCID is 2  points  mMRC: mMRC (Modified Medical Research Council) Dyspnea Scale is used to assess the degree of baseline functional disability in patients of respiratory disease due to dyspnea. No minimal important difference is established. A decrease in score of 1 point or greater is considered  a positive change.   Pulmonary Function Assessment:  Pulmonary Function Assessment - 04/15/22 1110       Breath   Bilateral Breath Sounds Clear;Decreased    Shortness of Breath No             Exercise Target Goals: Exercise Program Goal: Individual exercise prescription set using results from initial 6 min walk test and THRR while considering  patient's activity barriers and safety.   Exercise Prescription Goal: Initial exercise prescription builds to 30-45 minutes a day of aerobic activity, 2-3 days per week.  Home exercise guidelines will be given to patient during program as part of exercise prescription that the participant will acknowledge.  Activity Barriers & Risk Stratification:  Activity Barriers & Cardiac Risk Stratification - 04/15/22 1111       Activity Barriers & Cardiac Risk Stratification   Activity Barriers Back Problems;Shortness of Breath;Muscular Weakness;Deconditioning             6 Minute Walk:  6 Minute Walk     Row Name 04/15/22 1215         6 Minute Walk   Phase Initial     Distance 535 feet     Walk Time 6 minutes     # of Rest Breaks 0     MPH 1.01     METS 1.44     RPE 9     Perceived Dyspnea  0     VO2 Peak 5.04     Symptoms No     Resting HR 76 bpm     Resting BP 124/62     Resting Oxygen Saturation  97 %     Exercise Oxygen Saturation  during 6 min walk 90 %     Max Ex. HR 104 bpm     Max Ex. BP 122/64     2 Minute Post BP 118/60       Interval HR   1 Minute HR 89     2 Minute HR 92     3 Minute HR 94     4 Minute HR 94     5 Minute HR 104     6 Minute HR 96     2 Minute Post HR 81     Interval Heart Rate? Yes       Interval  Oxygen   Interval Oxygen? Yes     Baseline Oxygen Saturation % 97 %  On 1L. Decreased to no O2     1 Minute Oxygen Saturation % 93 %     1 Minute Liters of Oxygen 0 L     2 Minute Oxygen Saturation % 92 %     2 Minute Liters of Oxygen 0 L     3 Minute Oxygen Saturation % 91 %     3 Minute Liters of Oxygen 0 L     4 Minute Oxygen Saturation % 90 %     4 Minute Liters of Oxygen 0 L     5 Minute Oxygen Saturation % 90 %     5 Minute Liters of Oxygen 0 L     6 Minute Oxygen Saturation % 90 %     6 Minute Liters of Oxygen 0 L     2 Minute Post Oxygen Saturation % 95 %     2 Minute Post Liters of Oxygen 0 L              Oxygen Initial Assessment:  Oxygen  Initial Assessment - 04/15/22 1108       Home Oxygen   Home Oxygen Device Portable Concentrator;Home Concentrator    Sleep Oxygen Prescription Continuous    Liters per minute 1    Home Exercise Oxygen Prescription Pulsed    Liters per minute 2    Home Resting Oxygen Prescription Pulsed    Liters per minute 1    Compliance with Home Oxygen Use Yes      Initial 6 min Walk   Oxygen Used Continuous    Liters per minute 1      Program Oxygen Prescription   Program Oxygen Prescription Continuous    Liters per minute 1      Intervention   Short Term Goals To learn and exhibit compliance with exercise, home and travel O2 prescription;To learn and understand importance of maintaining oxygen saturations>88%;To learn and demonstrate proper use of respiratory medications;To learn and demonstrate proper pursed lip breathing techniques or other breathing techniques. ;To learn and understand importance of monitoring SPO2 with pulse oximeter and demonstrate accurate use of the pulse oximeter.    Long  Term Goals Exhibits compliance with exercise, home  and travel O2 prescription;Verbalizes importance of monitoring SPO2 with pulse oximeter and return demonstration;Maintenance of O2 saturations>88%;Exhibits proper breathing techniques, such  as pursed lip breathing or other method taught during program session;Compliance with respiratory medication             Oxygen Re-Evaluation:   Oxygen Discharge (Final Oxygen Re-Evaluation):   Initial Exercise Prescription:  Initial Exercise Prescription - 04/15/22 1200       Date of Initial Exercise RX and Referring Provider   Date 04/15/22    Referring Provider [name]    Expected Discharge Date 06/20/22      Oxygen   Oxygen Continuous    Liters 1    Maintain Oxygen Saturation 88% or higher      NuStep   Level 1    SPM 50    Minutes 15      Track   Minutes 15    METs 1.44      Prescription Details   Frequency (times per week) 2    Duration Progress to 30 minutes of continuous aerobic without signs/symptoms of physical distress      Intensity   THRR 40-80% of Max Heartrate 56-112    Ratings of Perceived Exertion 11-13    Perceived Dyspnea 0-4      Progression   Progression Continue to progress workloads to maintain intensity without signs/symptoms of physical distress.      Resistance Training   Training Prescription Yes    Weight red bands    Reps 10-15             Perform Capillary Blood Glucose checks as needed.  Exercise Prescription Changes:   Exercise Comments:   Exercise Goals and Review:   Exercise Goals     Row Name 04/15/22 1117             Exercise Goals   Increase Physical Activity Yes       Intervention Provide advice, education, support and counseling about physical activity/exercise needs.;Develop an individualized exercise prescription for aerobic and resistive training based on initial evaluation findings, risk stratification, comorbidities and participant's personal goals.       Expected Outcomes Short Term: Attend rehab on a regular basis to increase amount of physical activity.;Long Term: Exercising regularly at least 3-5 days a week.;Long Term: Add in home exercise to  make exercise part of routine and to increase  amount of physical activity.       Increase Strength and Stamina Yes       Intervention Provide advice, education, support and counseling about physical activity/exercise needs.;Develop an individualized exercise prescription for aerobic and resistive training based on initial evaluation findings, risk stratification, comorbidities and participant's personal goals.       Expected Outcomes Short Term: Increase workloads from initial exercise prescription for resistance, speed, and METs.;Short Term: Perform resistance training exercises routinely during rehab and add in resistance training at home;Long Term: Improve cardiorespiratory fitness, muscular endurance and strength as measured by increased METs and functional capacity (6MWT)       Able to understand and use rate of perceived exertion (RPE) scale Yes       Intervention Provide education and explanation on how to use RPE scale       Expected Outcomes Short Term: Able to use RPE daily in rehab to express subjective intensity level;Long Term:  Able to use RPE to guide intensity level when exercising independently       Able to understand and use Dyspnea scale Yes       Intervention Provide education and explanation on how to use Dyspnea scale       Expected Outcomes Short Term: Able to use Dyspnea scale daily in rehab to express subjective sense of shortness of breath during exertion;Long Term: Able to use Dyspnea scale to guide intensity level when exercising independently       Knowledge and understanding of Target Heart Rate Range (THRR) Yes       Intervention Provide education and explanation of THRR including how the numbers were predicted and where they are located for reference       Expected Outcomes Short Term: Able to state/look up THRR;Long Term: Able to use THRR to govern intensity when exercising independently;Short Term: Able to use daily as guideline for intensity in rehab       Understanding of Exercise Prescription Yes        Intervention Provide education, explanation, and written materials on patient's individual exercise prescription       Expected Outcomes Short Term: Able to explain program exercise prescription;Long Term: Able to explain home exercise prescription to exercise independently                Exercise Goals Re-Evaluation :   Discharge Exercise Prescription (Final Exercise Prescription Changes):   Nutrition:  Target Goals: Understanding of nutrition guidelines, daily intake of sodium <15109m, cholesterol <2041m calories 30% from fat and 7% or less from saturated fats, daily to have 5 or more servings of fruits and vegetables.  Biometrics:    Nutrition Therapy Plan and Nutrition Goals:   Nutrition Assessments:  MEDIFICTS Score Key: ?70 Need to make dietary changes  40-70 Heart Healthy Diet ? 40 Therapeutic Level Cholesterol Diet   Picture Your Plate Scores: <4<62nhealthy dietary pattern with much room for improvement. 41-50 Dietary pattern unlikely to meet recommendations for good health and room for improvement. 51-60 More healthful dietary pattern, with some room for improvement.  >60 Healthy dietary pattern, although there may be some specific behaviors that could be improved.    Nutrition Goals Re-Evaluation:   Nutrition Goals Discharge (Final Nutrition Goals Re-Evaluation):   Psychosocial: Target Goals: Acknowledge presence or absence of significant depression and/or stress, maximize coping skills, provide positive support system. Participant is able to verbalize types and ability to use techniques and skills needed  for reducing stress and depression.  Initial Review & Psychosocial Screening:  Initial Psych Review & Screening - 04/15/22 1057       Initial Review   Current issues with None Identified      Family Dynamics   Good Support System? Yes    Comments spouse and children      Barriers   Psychosocial barriers to participate in program There are no  identifiable barriers or psychosocial needs.;The patient should benefit from training in stress management and relaxation.      Screening Interventions   Interventions Encouraged to exercise             Quality of Life Scores:  Scores of 19 and below usually indicate a poorer quality of life in these areas.  A difference of  2-3 points is a clinically meaningful difference.  A difference of 2-3 points in the total score of the Quality of Life Index has been associated with significant improvement in overall quality of life, self-image, physical symptoms, and general health in studies assessing change in quality of life.  PHQ-9: Review Flowsheet       04/15/2022 10/08/2017  Depression screen PHQ 2/9  Decreased Interest 0 0  Down, Depressed, Hopeless 0 0  PHQ - 2 Score 0 0  Altered sleeping 0 -  Tired, decreased energy 0 -  Change in appetite 0 -  Feeling bad or failure about yourself  0 -  Trouble concentrating 0 -  Moving slowly or fidgety/restless 0 -  Suicidal thoughts 0 -  PHQ-9 Score 0 -  Difficult doing work/chores Not difficult at all -   Interpretation of Total Score  Total Score Depression Severity:  1-4 = Minimal depression, 5-9 = Mild depression, 10-14 = Moderate depression, 15-19 = Moderately severe depression, 20-27 = Severe depression   Psychosocial Evaluation and Intervention:  Psychosocial Evaluation - 04/15/22 1058       Psychosocial Evaluation & Interventions   Interventions Encouraged to exercise with the program and follow exercise prescription;Stress management education;Relaxation education    Comments Jeff Willis denies psychosocial barriers to PR    Expected Outcomes For Jeff Willis to participate in PR free of psychosocial barriers.    Continue Psychosocial Services  No Follow up required             Psychosocial Re-Evaluation:   Psychosocial Discharge (Final Psychosocial Re-Evaluation):   Education: Education Goals: Education classes will be  provided on a weekly basis, covering required topics. Participant will state understanding/return demonstration of topics presented.  Learning Barriers/Preferences:  Learning Barriers/Preferences - 04/15/22 1100       Learning Barriers/Preferences   Learning Barriers Sight   reading glasses   Learning Preferences Skilled Demonstration             Education Topics: Introduction to Pulmonary Rehab Group instruction provided by PowerPoint, verbal discussion, and written material to support subject matter. Instructor reviews what Pulmonary Rehab is, the purpose of the program, and how patients are referred.     Know Your Numbers Group instruction that is supported by a PowerPoint presentation. Instructor discusses importance of knowing and understanding resting, exercise, and post-exercise oxygen saturation, heart rate, and blood pressure. Oxygen saturation, heart rate, blood pressure, rating of perceived exertion, and dyspnea are reviewed along with a normal range for these values.    Exercise for the Pulmonary Patient Group instruction that is supported by a PowerPoint presentation. Instructor discusses benefits of exercise, core components of exercise, frequency, duration, and intensity of  an exercise routine, importance of utilizing pulse oximetry during exercise, safety while exercising, and options of places to exercise outside of rehab.       MET Level  Group instruction provided by PowerPoint, verbal discussion, and written material to support subject matter. Instructor reviews what METs are and how to increase METs.    Pulmonary Medications Verbally interactive group education provided by instructor with focus on inhaled medications and proper administration.   Anatomy and Physiology of the Respiratory System Group instruction provided by PowerPoint, verbal discussion, and written material to support subject matter. Instructor reviews respiratory cycle and anatomical  components of the respiratory system and their functions. Instructor also reviews differences in obstructive and restrictive respiratory diseases with examples of each.    Oxygen Safety Group instruction provided by PowerPoint, verbal discussion, and written material to support subject matter. There is an overview of "What is Oxygen" and "Why do we need it".  Instructor also reviews how to create a safe environment for oxygen use, the importance of using oxygen as prescribed, and the risks of noncompliance. There is a brief discussion on traveling with oxygen and resources the patient may utilize.   Oxygen Use Group instruction provided by PowerPoint, verbal discussion, and written material to discuss how supplemental oxygen is prescribed and different types of oxygen supply systems. Resources for more information are provided.    Breathing Techniques Group instruction that is supported by demonstration and informational handouts. Instructor discusses the benefits of pursed lip and diaphragmatic breathing and detailed demonstration on how to perform both.     Risk Factor Reduction Group instruction that is supported by a PowerPoint presentation. Instructor discusses the definition of a risk factor, different risk factors for pulmonary disease, and how the heart and lungs work together.   MD Day A group question and answer session with a medical doctor that allows participants to ask questions that relate to their pulmonary disease state.   Nutrition for the Pulmonary Patient Group instruction provided by PowerPoint slides, verbal discussion, and written materials to support subject matter. The instructor gives an explanation and review of healthy diet recommendations, which includes a discussion on weight management, recommendations for fruit and vegetable consumption, as well as protein, fluid, caffeine, fiber, sodium, sugar, and alcohol. Tips for eating when patients are short of breath are  discussed.    Other Education Group or individual verbal, written, or video instructions that support the educational goals of the pulmonary rehab program.    Knowledge Questionnaire Score:  Knowledge Questionnaire Score - 04/15/22 1115       Knowledge Questionnaire Score   Pre Score 11/18             Core Components/Risk Factors/Patient Goals at Admission:  Personal Goals and Risk Factors at Admission - 04/15/22 1104       Core Components/Risk Factors/Patient Goals on Admission    Weight Management Weight Gain;Yes    Intervention Weight Management: Develop a combined nutrition and exercise program designed to reach desired caloric intake, while maintaining appropriate intake of nutrient and fiber, sodium and fats, and appropriate energy expenditure required for the weight goal.;Weight Management: Provide education and appropriate resources to help participant work on and attain dietary goals.    Expected Outcomes Short Term: Continue to assess and modify interventions until short term weight is achieved;Long Term: Adherence to nutrition and physical activity/exercise program aimed toward attainment of established weight goal;Weight Maintenance: Understanding of the daily nutrition guidelines, which includes 25-35% calories from fat,  7% or less cal from saturated fats, less than 241m cholesterol, less than 1.5gm of sodium, & 5 or more servings of fruits and vegetables daily;Weight Gain: Understanding of general recommendations for a high calorie, high protein meal plan that promotes weight gain by distributing calorie intake throughout the day with the consumption for 4-5 meals, snacks, and/or supplements;Understanding of distribution of calorie intake throughout the day with the consumption of 4-5 meals/snacks;Understanding recommendations for meals to include 15-35% energy as protein, 25-35% energy from fat, 35-60% energy from carbohydrates, less than 2019mof dietary cholesterol, 20-35  gm of total fiber daily    Improve shortness of breath with ADL's Yes    Intervention Provide education, individualized exercise plan and daily activity instruction to help decrease symptoms of SOB with activities of daily living.    Expected Outcomes Short Term: Improve cardiorespiratory fitness to achieve a reduction of symptoms when performing ADLs;Long Term: Be able to perform more ADLs without symptoms or delay the onset of symptoms             Core Components/Risk Factors/Patient Goals Review:    Core Components/Risk Factors/Patient Goals at Discharge (Final Review):    ITP Comments: Dr. JaRodman Pickles Medical Director for Pulmonary Rehab at [facility]

## 2022-04-17 NOTE — Progress Notes (Signed)
Pulmonary Individual Treatment Plan  Patient Details  Name: Jeff Willis MRN: [id number] Date of Birth: 1941/03/02 Referring Provider:   April Manson Pulmonary Rehab Walk Test from 04/15/2022 in [facility] [place]  Referring Provider [name]       Initial Encounter Date:  Flowsheet Row Pulmonary Rehab Walk Test from 04/15/2022 in [facility] [place]  Date 04/15/22       Visit Diagnosis: ILD (interstitial lung disease) ([place])  Patient's Home Medications on Admission:   Current Outpatient Medications:    amoxicillin-clavulanate (AUGMENTIN) 400-57 MG/5ML suspension, Take 10.9 mLs (875 mg total) by mouth 2 (two) times daily., Disp: 160 mL, Rfl: 0   aspirin EC 81 MG tablet, Take 81 mg by mouth daily. Swallow whole., Disp: , Rfl:    azelastine (ASTELIN) 0.1 % nasal spray, Place 1 spray into both nostrils 2 (two) times daily. Use in each nostril as directed, Disp: 30 mL, Rfl: 1   chlorpheniramine-HYDROcodone (TUSSIONEX) 10-8 MG/5ML, Take 5 mLs by mouth every 12 (twelve) hours as needed for cough., Disp: 300 mL, Rfl: 0   fluticasone (FLONASE) 50 MCG/ACT nasal spray, Place 1 spray into both nostrils in the morning and at bedtime., Disp: 16 g, Rfl: 1   furosemide (LASIX) 20 MG tablet, Take 1 tablet (20 mg total) by mouth daily as needed for edema., Disp: 30 tablet, Rfl: 1   lidocaine (LIDODERM) 5 %, Place 1 patch onto the skin daily. Remove & Discard patch within 12 hours or as directed by MD, Disp: 30 patch, Rfl: 3   melatonin 3 MG TABS tablet, Take 1 tablet (3 mg total) by mouth at bedtime as needed., Disp: , Rfl: 0   metoprolol succinate (TOPROL-XL) 25 MG 24 hr tablet, TAKE ONE TABLET BY MOUTH DAILY, Disp: 90 tablet, Rfl: 0   Multiple Vitamin (MULTIVITAMIN WITH MINERALS) TABS tablet, Take 1 tablet by mouth daily., Disp: , Rfl:    pantoprazole (PROTONIX) 40 MG tablet, Take 1 tablet  (40 mg total) by mouth 2 (two) times daily., Disp: 30 tablet, Rfl: 2   Pirfenidone 267 MG CAPS, Take 534 mg by mouth in the morning, at noon, and at bedtime., Disp: , Rfl:    predniSONE (DELTASONE) 5 MG tablet, On 11/14, take 18m dailyx2 weeks, then 11/28, take 538mMon,Wed,Fri for 2 weeks, then 12/12 take 70m59monx2 weeks, then stop (Patient taking differently: Take 10 mg by mouth daily with breakfast. On 11/14, take 70mg49milyx2 weeks, then 11/28, take 70mg 42m,Wed,Fri for 2 weeks, then 12/12 take 70mg M84m2 weeks, then stop), Disp: 22 tablet, Rfl: 0   Vitamin D, Ergocalciferol, (DRISDOL) 1.25 MG (50000 UNIT) CAPS capsule, Take 1 capsule (50,000 Units total) by mouth every 7 (seven) days., Disp: 5 capsule, Rfl: 0   Azelastine-Fluticasone 137-50 MCG/ACT SUSP, Place 1 spray into the nose in the morning and at bedtime. (Patient not taking: Reported on 04/11/2022), Disp: 23 g, Rfl: 1   bethanechol (URECHOLINE) 10 MG tablet, Take 10 mg by mouth 2 (two) times daily. (Patient not taking: Reported on 04/15/2022), Disp: , Rfl:    gabapentin (NEURONTIN) 250 MG/5ML solution, Take 4 mLs (200 mg total) by mouth 2 (two) times daily for 7 days, THEN 2 mLs (100 mg total) daily for 7 days, THEN 2 mLs (100 mg total) 3 (three) times a week for 7 days. (Patient not taking: Reported on 04/01/2022), Disp: 76 mL, Rfl: 0  glucose blood (FREESTYLE LITE) test strip, Use ACH/HS to check CBGs (Patient not taking: Reported on 04/15/2022), Disp: 60 each, Rfl: 5   rosuvastatin (CRESTOR) 10 MG tablet, TAKE ONE TABLET BY MOUTH DAILY, Disp: 90 tablet, Rfl: 3   tamsulosin (FLOMAX) 0.4 MG CAPS capsule, Take 2 capsules (0.8 mg total) by mouth daily after supper. (Patient not taking: Reported on 04/15/2022), Disp: 60 capsule, Rfl: 0  Past Medical History: Past Medical History:  Diagnosis Date   Back pain    Elevated PSA    Essential tremor    Hypercholesteremia    Neuropathy    Prostate cancer ([place])    Sinus tachycardia 01/30/2018     Tobacco Use: Social History   Tobacco Use  Smoking Status Never  Smokeless Tobacco Never    Labs: Review Flowsheet  More data exists      Latest Ref Rng & Units 01/01/2022 01/02/2022 01/03/2022 01/04/2022 01/07/2022  Labs for ITP Cardiac and Pulmonary Rehab  Trlycerides <150 mg/dL - - - 36  70   Hemoglobin A1c 4.8 - 5.6 % 5.8  - - - -  PH, Arterial 7.35 - 7.45 7.374  7.277  7.256  7.366  7.362  7.428  - -  PCO2 arterial 32 - 48 mmHg 50.2  72.1  72.5  52.8  55.6  49.0  - -  Bicarbonate 20.0 - 28.0 mmol/L 29.5  33.9  32.6  30.5  31.7  32.5  - -  TCO2 22 - 32 mmol/L 31  36  35  32  33  34  - -  O2 Saturation % 98  100  100  99  91  99  - -    Capillary Blood Glucose: Lab Results  Component Value Date   GLUCAP 115 (H) 03/12/2022   GLUCAP 76 01/28/2022   GLUCAP 174 (H) 01/27/2022   GLUCAP 151 (H) 01/27/2022   GLUCAP 115 (H) 01/27/2022     Pulmonary Assessment Scores:  Pulmonary Assessment Scores     Row Name 04/15/22 1111         ADL UCSD   ADL Phase Entry     SOB Score total 71       CAT Score   CAT Score 11       mMRC Score   mMRC Score 0             UCSD: Self-administered rating of dyspnea associated with activities of daily living (ADLs) 6-point scale (0 = "not at all" to 5 = "maximal or unable to do because of breathlessness")  Scoring Scores range from 0 to 120.  Minimally important difference is 5 units  CAT: CAT can identify the health impairment of COPD patients and is better correlated with disease progression.  CAT has a scoring range of zero to 40. The CAT score is classified into four groups of low (less than 10), medium (10 - 20), high (21-30) and very high (31-40) based on the impact level of disease on health status. A CAT score over 10 suggests significant symptoms.  A worsening CAT score could be explained by an exacerbation, poor medication adherence, poor inhaler technique, or progression of COPD or comorbid conditions.  CAT MCID is 2  points  mMRC: mMRC (Modified Medical Research Council) Dyspnea Scale is used to assess the degree of baseline functional disability in patients of respiratory disease due to dyspnea. No minimal important difference is established. A decrease in score of 1 point or greater is considered  a positive change.   Pulmonary Function Assessment:  Pulmonary Function Assessment - 04/15/22 1110       Breath   Bilateral Breath Sounds Clear;Decreased    Shortness of Breath No             Exercise Target Goals: Exercise Program Goal: Individual exercise prescription set using results from initial 6 min walk test and THRR while considering  patient's activity barriers and safety.   Exercise Prescription Goal: Initial exercise prescription builds to 30-45 minutes a day of aerobic activity, 2-3 days per week.  Home exercise guidelines will be given to patient during program as part of exercise prescription that the participant will acknowledge.  Activity Barriers & Risk Stratification:  Activity Barriers & Cardiac Risk Stratification - 04/15/22 1111       Activity Barriers & Cardiac Risk Stratification   Activity Barriers Back Problems;Shortness of Breath;Muscular Weakness;Deconditioning             6 Minute Walk:  6 Minute Walk     Row Name 04/15/22 1215         6 Minute Walk   Phase Initial     Distance 535 feet     Walk Time 6 minutes     # of Rest Breaks 0     MPH 1.01     METS 1.44     RPE 9     Perceived Dyspnea  0     VO2 Peak 5.04     Symptoms No     Resting HR 76 bpm     Resting BP 124/62     Resting Oxygen Saturation  97 %     Exercise Oxygen Saturation  during 6 min walk 90 %     Max Ex. HR 104 bpm     Max Ex. BP 122/64     2 Minute Post BP 118/60       Interval HR   1 Minute HR 89     2 Minute HR 92     3 Minute HR 94     4 Minute HR 94     5 Minute HR 104     6 Minute HR 96     2 Minute Post HR 81     Interval Heart Rate? Yes       Interval  Oxygen   Interval Oxygen? Yes     Baseline Oxygen Saturation % 97 %  On 1L. Decreased to no O2     1 Minute Oxygen Saturation % 93 %     1 Minute Liters of Oxygen 0 L     2 Minute Oxygen Saturation % 92 %     2 Minute Liters of Oxygen 0 L     3 Minute Oxygen Saturation % 91 %     3 Minute Liters of Oxygen 0 L     4 Minute Oxygen Saturation % 90 %     4 Minute Liters of Oxygen 0 L     5 Minute Oxygen Saturation % 90 %     5 Minute Liters of Oxygen 0 L     6 Minute Oxygen Saturation % 90 %     6 Minute Liters of Oxygen 0 L     2 Minute Post Oxygen Saturation % 95 %     2 Minute Post Liters of Oxygen 0 L              Oxygen Initial Assessment:  Oxygen  Initial Assessment - 04/15/22 1108       Home Oxygen   Home Oxygen Device Portable Concentrator;Home Concentrator    Sleep Oxygen Prescription Continuous    Liters per minute 1    Home Exercise Oxygen Prescription Pulsed    Liters per minute 2    Home Resting Oxygen Prescription Pulsed    Liters per minute 1    Compliance with Home Oxygen Use Yes      Initial 6 min Walk   Oxygen Used Continuous    Liters per minute 1      Program Oxygen Prescription   Program Oxygen Prescription Continuous    Liters per minute 1      Intervention   Short Term Goals To learn and exhibit compliance with exercise, home and travel O2 prescription;To learn and understand importance of maintaining oxygen saturations>88%;To learn and demonstrate proper use of respiratory medications;To learn and demonstrate proper pursed lip breathing techniques or other breathing techniques. ;To learn and understand importance of monitoring SPO2 with pulse oximeter and demonstrate accurate use of the pulse oximeter.    Long  Term Goals Exhibits compliance with exercise, home  and travel O2 prescription;Verbalizes importance of monitoring SPO2 with pulse oximeter and return demonstration;Maintenance of O2 saturations>88%;Exhibits proper breathing techniques, such  as pursed lip breathing or other method taught during program session;Compliance with respiratory medication             Oxygen Re-Evaluation:  Oxygen Re-Evaluation     Row Name 04/16/22 0935             Program Oxygen Prescription   Program Oxygen Prescription Continuous       Liters per minute 1         Home Oxygen   Home Oxygen Device Portable Concentrator;Home Concentrator       Sleep Oxygen Prescription Continuous       Liters per minute 1       Home Exercise Oxygen Prescription Pulsed       Liters per minute 2       Home Resting Oxygen Prescription Pulsed       Liters per minute 1       Compliance with Home Oxygen Use Yes         Goals/Expected Outcomes   Short Term Goals To learn and exhibit compliance with exercise, home and travel O2 prescription;To learn and understand importance of maintaining oxygen saturations>88%;To learn and demonstrate proper use of respiratory medications;To learn and demonstrate proper pursed lip breathing techniques or other breathing techniques. ;To learn and understand importance of monitoring SPO2 with pulse oximeter and demonstrate accurate use of the pulse oximeter.       Long  Term Goals Exhibits compliance with exercise, home  and travel O2 prescription;Verbalizes importance of monitoring SPO2 with pulse oximeter and return demonstration;Maintenance of O2 saturations>88%;Exhibits proper breathing techniques, such as pursed lip breathing or other method taught during program session;Compliance with respiratory medication       Goals/Expected Outcomes Compliance and understanding of oxygen saturation monitoring and breathing techniques to decrease shortness of breath.                Oxygen Discharge (Final Oxygen Re-Evaluation):  Oxygen Re-Evaluation - 04/16/22 0935       Program Oxygen Prescription   Program Oxygen Prescription Continuous    Liters per minute 1      Home Oxygen   Home Oxygen Device Portable Concentrator;Home  Concentrator    Sleep Oxygen  Prescription Continuous    Liters per minute 1    Home Exercise Oxygen Prescription Pulsed    Liters per minute 2    Home Resting Oxygen Prescription Pulsed    Liters per minute 1    Compliance with Home Oxygen Use Yes      Goals/Expected Outcomes   Short Term Goals To learn and exhibit compliance with exercise, home and travel O2 prescription;To learn and understand importance of maintaining oxygen saturations>88%;To learn and demonstrate proper use of respiratory medications;To learn and demonstrate proper pursed lip breathing techniques or other breathing techniques. ;To learn and understand importance of monitoring SPO2 with pulse oximeter and demonstrate accurate use of the pulse oximeter.    Long  Term Goals Exhibits compliance with exercise, home  and travel O2 prescription;Verbalizes importance of monitoring SPO2 with pulse oximeter and return demonstration;Maintenance of O2 saturations>88%;Exhibits proper breathing techniques, such as pursed lip breathing or other method taught during program session;Compliance with respiratory medication    Goals/Expected Outcomes Compliance and understanding of oxygen saturation monitoring and breathing techniques to decrease shortness of breath.             Initial Exercise Prescription:  Initial Exercise Prescription - 04/15/22 1200       Date of Initial Exercise RX and Referring Provider   Date 04/15/22    Referring Provider [name]    Expected Discharge Date 06/20/22      Oxygen   Oxygen Continuous    Liters 1    Maintain Oxygen Saturation 88% or higher      NuStep   Level 1    SPM 50    Minutes 15      Track   Minutes 15    METs 1.44      Prescription Details   Frequency (times per week) 2    Duration Progress to 30 minutes of continuous aerobic without signs/symptoms of physical distress      Intensity   THRR 40-80% of Max Heartrate 56-112    Ratings of Perceived Exertion 11-13     Perceived Dyspnea 0-4      Progression   Progression Continue to progress workloads to maintain intensity without signs/symptoms of physical distress.      Resistance Training   Training Prescription Yes    Weight red bands    Reps 10-15             Perform Capillary Blood Glucose checks as needed.  Exercise Prescription Changes:   Exercise Comments:   Exercise Goals and Review:   Exercise Goals     Row Name 04/15/22 1117 04/16/22 0934           Exercise Goals   Increase Physical Activity Yes Yes      Intervention Provide advice, education, support and counseling about physical activity/exercise needs.;Develop an individualized exercise prescription for aerobic and resistive training based on initial evaluation findings, risk stratification, comorbidities and participant's personal goals. Provide advice, education, support and counseling about physical activity/exercise needs.;Develop an individualized exercise prescription for aerobic and resistive training based on initial evaluation findings, risk stratification, comorbidities and participant's personal goals.      Expected Outcomes Short Term: Attend rehab on a regular basis to increase amount of physical activity.;Long Term: Exercising regularly at least 3-5 days a week.;Long Term: Add in home exercise to make exercise part of routine and to increase amount of physical activity. Short Term: Attend rehab on a regular basis to increase amount of physical activity.;Long Term: Exercising  regularly at least 3-5 days a week.;Long Term: Add in home exercise to make exercise part of routine and to increase amount of physical activity.      Increase Strength and Stamina Yes Yes      Intervention Provide advice, education, support and counseling about physical activity/exercise needs.;Develop an individualized exercise prescription for aerobic and resistive training based on initial evaluation findings, risk stratification,  comorbidities and participant's personal goals. Provide advice, education, support and counseling about physical activity/exercise needs.;Develop an individualized exercise prescription for aerobic and resistive training based on initial evaluation findings, risk stratification, comorbidities and participant's personal goals.      Expected Outcomes Short Term: Increase workloads from initial exercise prescription for resistance, speed, and METs.;Short Term: Perform resistance training exercises routinely during rehab and add in resistance training at home;Long Term: Improve cardiorespiratory fitness, muscular endurance and strength as measured by increased METs and functional capacity (6MWT) Short Term: Increase workloads from initial exercise prescription for resistance, speed, and METs.;Short Term: Perform resistance training exercises routinely during rehab and add in resistance training at home;Long Term: Improve cardiorespiratory fitness, muscular endurance and strength as measured by increased METs and functional capacity (6MWT)      Able to understand and use rate of perceived exertion (RPE) scale Yes Yes      Intervention Provide education and explanation on how to use RPE scale Provide education and explanation on how to use RPE scale      Expected Outcomes Short Term: Able to use RPE daily in rehab to express subjective intensity level;Long Term:  Able to use RPE to guide intensity level when exercising independently Short Term: Able to use RPE daily in rehab to express subjective intensity level;Long Term:  Able to use RPE to guide intensity level when exercising independently      Able to understand and use Dyspnea scale Yes Yes      Intervention Provide education and explanation on how to use Dyspnea scale Provide education and explanation on how to use Dyspnea scale      Expected Outcomes Short Term: Able to use Dyspnea scale daily in rehab to express subjective sense of shortness of breath  during exertion;Long Term: Able to use Dyspnea scale to guide intensity level when exercising independently Short Term: Able to use Dyspnea scale daily in rehab to express subjective sense of shortness of breath during exertion;Long Term: Able to use Dyspnea scale to guide intensity level when exercising independently      Knowledge and understanding of Target Heart Rate Range (THRR) Yes Yes      Intervention Provide education and explanation of THRR including how the numbers were predicted and where they are located for reference Provide education and explanation of THRR including how the numbers were predicted and where they are located for reference      Expected Outcomes Short Term: Able to state/look up THRR;Long Term: Able to use THRR to govern intensity when exercising independently;Short Term: Able to use daily as guideline for intensity in rehab Short Term: Able to state/look up THRR;Long Term: Able to use THRR to govern intensity when exercising independently;Short Term: Able to use daily as guideline for intensity in rehab      Understanding of Exercise Prescription Yes Yes      Intervention Provide education, explanation, and written materials on patient's individual exercise prescription Provide education, explanation, and written materials on patient's individual exercise prescription      Expected Outcomes Short Term: Able to explain program exercise prescription;Long  Term: Able to explain home exercise prescription to exercise independently Short Term: Able to explain program exercise prescription;Long Term: Able to explain home exercise prescription to exercise independently               Exercise Goals Re-Evaluation :  Exercise Goals Re-Evaluation     [place] Name 04/16/22 0934             Exercise Goal Re-Evaluation   Exercise Goals Review Increase Physical Activity;Able to understand and use Dyspnea scale;Understanding of Exercise Prescription;Increase Strength and  Stamina;Knowledge and understanding of Target Heart Rate Range (THRR);Able to understand and use rate of perceived exertion (RPE) scale       Comments Pt will begin exercise next week. Will monitor and progress as tolerated.       Expected Outcomes Through exercise at rehab and home, the patient will decrease shortness of breath with daily activities and feel confident in carrying out an exercise regimen at home.                Discharge Exercise Prescription (Final Exercise Prescription Changes):   Nutrition:  Target Goals: Understanding of nutrition guidelines, daily intake of sodium <1551m, cholesterol <2082m calories 30% from fat and 7% or less from saturated fats, daily to have 5 or more servings of fruits and vegetables.  Biometrics:    Nutrition Therapy Plan and Nutrition Goals:   Nutrition Assessments:  MEDIFICTS Score Key: ?70 Need to make dietary changes  40-70 Heart Healthy Diet ? 40 Therapeutic Level Cholesterol Diet   Picture Your Plate Scores: <4<70nhealthy dietary pattern with much room for improvement. 41-50 Dietary pattern unlikely to meet recommendations for good health and room for improvement. 51-60 More healthful dietary pattern, with some room for improvement.  >60 Healthy dietary pattern, although there may be some specific behaviors that could be improved.    Nutrition Goals Re-Evaluation:   Nutrition Goals Discharge (Final Nutrition Goals Re-Evaluation):   Psychosocial: Target Goals: Acknowledge presence or absence of significant depression and/or stress, maximize coping skills, provide positive support system. Participant is able to verbalize types and ability to use techniques and skills needed for reducing stress and depression.  Initial Review & Psychosocial Screening:  Initial Psych Review & Screening - 04/15/22 1057       Initial Review   Current issues with None Identified      Family Dynamics   Good Support System? Yes     Comments spouse and children      Barriers   Psychosocial barriers to participate in program There are no identifiable barriers or psychosocial needs.;The patient should benefit from training in stress management and relaxation.      Screening Interventions   Interventions Encouraged to exercise             Quality of Life Scores:  Scores of 19 and below usually indicate a poorer quality of life in these areas.  A difference of  2-3 points is a clinically meaningful difference.  A difference of 2-3 points in the total score of the Quality of Life Index has been associated with significant improvement in overall quality of life, self-image, physical symptoms, and general health in studies assessing change in quality of life.  PHQ-9: Review Flowsheet       04/15/2022 10/08/2017  Depression screen PHQ 2/9  Decreased Interest 0 0  Down, Depressed, Hopeless 0 0  PHQ - 2 Score 0 0  Altered sleeping 0 -  Tired, decreased energy 0 -  Change in appetite 0 -  Feeling bad or failure about yourself  0 -  Trouble concentrating 0 -  Moving slowly or fidgety/restless 0 -  Suicidal thoughts 0 -  PHQ-9 Score 0 -  Difficult doing work/chores Not difficult at all -   Interpretation of Total Score  Total Score Depression Severity:  1-4 = Minimal depression, 5-9 = Mild depression, 10-14 = Moderate depression, 15-19 = Moderately severe depression, 20-27 = Severe depression   Psychosocial Evaluation and Intervention:  Psychosocial Evaluation - 04/15/22 1058       Psychosocial Evaluation & Interventions   Interventions Encouraged to exercise with the program and follow exercise prescription;Stress management education;Relaxation education    Comments Chhit denies psychosocial barriers to PR    Expected Outcomes For Chhit to participate in PR free of psychosocial barriers.    Continue Psychosocial Services  No Follow up required             Psychosocial Re-Evaluation:  Psychosocial  Re-Evaluation     [place] Name 04/17/22 1400             Psychosocial Re-Evaluation   Current issues with None Identified       Comments Reice has completed orientation to the PR program. He is scheduled to start exercising 04/23/22 at 1:15 pm. We will continue to evaluate for any psychosocial barriers as he attends.       Expected Outcomes For him to participate in the program without any psychosocial issues or concerns.       Interventions Encouraged to attend Pulmonary Rehabilitation for the exercise       Continue Psychosocial Services  No Follow up required                Psychosocial Discharge (Final Psychosocial Re-Evaluation):  Psychosocial Re-Evaluation - 04/17/22 1400       Psychosocial Re-Evaluation   Current issues with None Identified    Comments Tyreik has completed orientation to the PR program. He is scheduled to start exercising 04/23/22 at 1:15 pm. We will continue to evaluate for any psychosocial barriers as he attends.    Expected Outcomes For him to participate in the program without any psychosocial issues or concerns.    Interventions Encouraged to attend Pulmonary Rehabilitation for the exercise    Continue Psychosocial Services  No Follow up required             Education: Education Goals: Education classes will be provided on a weekly basis, covering required topics. Participant will state understanding/return demonstration of topics presented.  Learning Barriers/Preferences:  Learning Barriers/Preferences - 04/15/22 1100       Learning Barriers/Preferences   Learning Barriers Sight   reading glasses   Learning Preferences Skilled Demonstration             Education Topics: Introduction to Pulmonary Rehab Group instruction provided by PowerPoint, verbal discussion, and written material to support subject matter. Instructor reviews what Pulmonary Rehab is, the purpose of the program, and how patients are referred.     Know Your  Numbers Group instruction that is supported by a PowerPoint presentation. Instructor discusses importance of knowing and understanding resting, exercise, and post-exercise oxygen saturation, heart rate, and blood pressure. Oxygen saturation, heart rate, blood pressure, rating of perceived exertion, and dyspnea are reviewed along with a normal range for these values.    Exercise for the Pulmonary Patient Group instruction that is supported by a PowerPoint presentation. Instructor discusses benefits of exercise, core components of  exercise, frequency, duration, and intensity of an exercise routine, importance of utilizing pulse oximetry during exercise, safety while exercising, and options of places to exercise outside of rehab.       MET Level  Group instruction provided by PowerPoint, verbal discussion, and written material to support subject matter. Instructor reviews what METs are and how to increase METs.    Pulmonary Medications Verbally interactive group education provided by instructor with focus on inhaled medications and proper administration.   Anatomy and Physiology of the Respiratory System Group instruction provided by PowerPoint, verbal discussion, and written material to support subject matter. Instructor reviews respiratory cycle and anatomical components of the respiratory system and their functions. Instructor also reviews differences in obstructive and restrictive respiratory diseases with examples of each.    Oxygen Safety Group instruction provided by PowerPoint, verbal discussion, and written material to support subject matter. There is an overview of "What is Oxygen" and "Why do we need it".  Instructor also reviews how to create a safe environment for oxygen use, the importance of using oxygen as prescribed, and the risks of noncompliance. There is a brief discussion on traveling with oxygen and resources the patient may utilize.   Oxygen Use Group instruction  provided by PowerPoint, verbal discussion, and written material to discuss how supplemental oxygen is prescribed and different types of oxygen supply systems. Resources for more information are provided.    Breathing Techniques Group instruction that is supported by demonstration and informational handouts. Instructor discusses the benefits of pursed lip and diaphragmatic breathing and detailed demonstration on how to perform both.     Risk Factor Reduction Group instruction that is supported by a PowerPoint presentation. Instructor discusses the definition of a risk factor, different risk factors for pulmonary disease, and how the heart and lungs work together.   MD Day A group question and answer session with a medical doctor that allows participants to ask questions that relate to their pulmonary disease state.   Nutrition for the Pulmonary Patient Group instruction provided by PowerPoint slides, verbal discussion, and written materials to support subject matter. The instructor gives an explanation and review of healthy diet recommendations, which includes a discussion on weight management, recommendations for fruit and vegetable consumption, as well as protein, fluid, caffeine, fiber, sodium, sugar, and alcohol. Tips for eating when patients are short of breath are discussed.    Other Education Group or individual verbal, written, or video instructions that support the educational goals of the pulmonary rehab program.    Knowledge Questionnaire Score:  Knowledge Questionnaire Score - 04/15/22 1115       Knowledge Questionnaire Score   Pre Score 11/18             Core Components/Risk Factors/Patient Goals at Admission:  Personal Goals and Risk Factors at Admission - 04/15/22 1104       Core Components/Risk Factors/Patient Goals on Admission    Weight Management Weight Gain;Yes    Intervention Weight Management: Develop a combined nutrition and exercise program designed to  reach desired caloric intake, while maintaining appropriate intake of nutrient and fiber, sodium and fats, and appropriate energy expenditure required for the weight goal.;Weight Management: Provide education and appropriate resources to help participant work on and attain dietary goals.    Expected Outcomes Short Term: Continue to assess and modify interventions until short term weight is achieved;Long Term: Adherence to nutrition and physical activity/exercise program aimed toward attainment of established weight goal;Weight Maintenance: Understanding of the daily nutrition guidelines,  which includes 25-35% calories from fat, 7% or less cal from saturated fats, less than 283m cholesterol, less than 1.5gm of sodium, & 5 or more servings of fruits and vegetables daily;Weight Gain: Understanding of general recommendations for a high calorie, high protein meal plan that promotes weight gain by distributing calorie intake throughout the day with the consumption for 4-5 meals, snacks, and/or supplements;Understanding of distribution of calorie intake throughout the day with the consumption of 4-5 meals/snacks;Understanding recommendations for meals to include 15-35% energy as protein, 25-35% energy from fat, 35-60% energy from carbohydrates, less than 2024mof dietary cholesterol, 20-35 gm of total fiber daily    Improve shortness of breath with ADL's Yes    Intervention Provide education, individualized exercise plan and daily activity instruction to help decrease symptoms of SOB with activities of daily living.    Expected Outcomes Short Term: Improve cardiorespiratory fitness to achieve a reduction of symptoms when performing ADLs;Long Term: Be able to perform more ADLs without symptoms or delay the onset of symptoms             Core Components/Risk Factors/Patient Goals Review:   Goals and Risk Factor Review     Row Name 04/17/22 1430             Core Components/Risk Factors/Patient Goals  Review   Personal Goals Review Other;Improve shortness of breath with ADL's;Develop more efficient breathing techniques such as purse lipped breathing and diaphragmatic breathing and practicing self-pacing with activity.;Increase knowledge of respiratory medications and ability to use respiratory devices properly.  Weight gain       Review Amarian has completed PR orientation and is scheduled to start exercising 04/23/22. We will continue to  monitor his core components and address them.       Expected Outcomes See admission goals.                Core Components/Risk Factors/Patient Goals at Discharge (Final Review):   Goals and Risk Factor Review - 04/17/22 1430       Core Components/Risk Factors/Patient Goals Review   Personal Goals Review Other;Improve shortness of breath with ADL's;Develop more efficient breathing techniques such as purse lipped breathing and diaphragmatic breathing and practicing self-pacing with activity.;Increase knowledge of respiratory medications and ability to use respiratory devices properly.   Weight gain   Review Keyshawn has completed PR orientation and is scheduled to start exercising 04/23/22. We will continue to  monitor his core components and address them.    Expected Outcomes See admission goals.             ITP Comments: Pt is starting exercising 04/23/22 at 1:15 pm   Comments: Dr. JaRodman Pickles Medical Director for Pulmonary Rehab at [facility]

## 2022-04-23 ENCOUNTER — Encounter (HOSPITAL_COMMUNITY)
Admission: RE | Admit: 2022-04-23 | Discharge: 2022-04-23 | Disposition: A | Discharge status: Home / Self Care | Payer: Medicare Other | Source: Ambulatory Visit | Attending: Internal Medicine | Admitting: Internal Medicine

## 2022-04-23 DIAGNOSIS — J849 Interstitial pulmonary disease, unspecified: Secondary | ICD-10-CM | POA: Diagnosis not present

## 2022-04-23 NOTE — Progress Notes (Signed)
Daily Session Note  Patient Details  Name: Jeff Willis MRN: [id number] Date of Birth: 1941/01/06 Referring Provider:   April Manson Pulmonary Rehab Walk Test from 04/15/2022 in [facility] for Heart, Vascular, & [place]  Referring Provider [name]       Encounter Date: 04/23/2022  Check In:  Session Check In - 04/23/22 1600       Check-In   Supervising physician immediately available to respond to emergencies [facility] - Physician supervision    Physician(s) Erskine Emery    Location [facility]    Staff Present Josephina Shih BS, ACSM-CEP, Exercise Physiologist;Lisa Ysidro Evert, RN;Johnny Starleen Blue, MS, Exercise Physiologist    Virtual Visit No    Medication changes reported     No    Fall or balance concerns reported    No    Tobacco Cessation No Change    Warm-up and Cool-down Performed as group-led instruction    Resistance Training Performed Yes    VAD Patient? No    PAD/SET Patient? No      Pain Assessment   Currently in Pain? No/denies             Capillary Blood Glucose: No results found for this or any previous visit (from the past 24 hour(s)).    Social History   Tobacco Use  Smoking Status Never  Smokeless Tobacco Never    Goals Met:  Exercise tolerated well No report of concerns or symptoms today Strength training completed today  Goals Unmet:  Not Applicable  Comments: First day of exercise. Service time is from 1322 to 1455.    Dr. Rodman Pickle is Medical Director for Pulmonary Rehab at [facility].

## 2022-04-24 ENCOUNTER — Ambulatory Visit (HOSPITAL_COMMUNITY)
Admission: RE | Admit: 2022-04-24 | Discharge: 2022-04-24 | Disposition: A | Discharge status: Home / Self Care | Payer: Medicare Other | Source: Ambulatory Visit | Attending: Primary Care | Admitting: Primary Care

## 2022-04-24 DIAGNOSIS — J84112 Idiopathic pulmonary fibrosis: Secondary | ICD-10-CM | POA: Diagnosis present

## 2022-04-24 DIAGNOSIS — R053 Chronic cough: Secondary | ICD-10-CM | POA: Diagnosis present

## 2022-04-25 ENCOUNTER — Encounter (HOSPITAL_COMMUNITY): Payer: Medicare Other

## 2022-04-25 ENCOUNTER — Encounter (HOSPITAL_COMMUNITY)
Admission: RE | Admit: 2022-04-25 | Discharge: 2022-04-25 | Disposition: A | Discharge status: Home / Self Care | Payer: Medicare Other | Source: Ambulatory Visit | Attending: Physical Medicine and Rehabilitation | Admitting: Physical Medicine and Rehabilitation

## 2022-04-25 DIAGNOSIS — J849 Interstitial pulmonary disease, unspecified: Secondary | ICD-10-CM | POA: Diagnosis not present

## 2022-04-25 NOTE — Progress Notes (Signed)
Daily Session Note  Patient Details  Name: Jeff Willis MRN: [id number] Date of Birth: 1940-07-06 Referring Provider:   April Manson Pulmonary Rehab Walk Test from 04/15/2022 in [facility] for Heart, Vascular, & [place]  Referring Provider [name]       Encounter Date: 04/25/2022  Check In:  Session Check In - 04/25/22 1722       Check-In   Supervising physician immediately available to respond to emergencies [facility] - Physician supervision    Physician(s) Erskine Emery    Location [facility]    Staff Present Maurice Small, RN, BSN;Lisa Ysidro Evert, RN;Yarelly Kuba Yevonne Pax, ACSM-CEP, Exercise Physiologist;Jetta Walker BS, ACSM-CEP, Exercise Physiologist    Virtual Visit No    Medication changes reported     No    Fall or balance concerns reported    No    Tobacco Cessation No Change    Warm-up and Cool-down Performed as group-led instruction    Resistance Training Performed Yes    VAD Patient? No    PAD/SET Patient? No      Pain Assessment   Currently in Pain? No/denies             Capillary Blood Glucose: No results found for this or any previous visit (from the past 24 hour(s)).    Social History   Tobacco Use  Smoking Status Never  Smokeless Tobacco Never    Goals Met:  Exercise tolerated well No report of concerns or symptoms today Strength training completed today  Goals Unmet:  Not Applicable  Comments: Service time is from 1334 to 1510.    Dr. Rodman Pickle is Medical Director for Pulmonary Rehab at [facility].

## 2022-04-30 ENCOUNTER — Encounter (HOSPITAL_COMMUNITY)
Admission: RE | Admit: 2022-04-30 | Discharge: 2022-04-30 | Disposition: A | Discharge status: Home / Self Care | Payer: Medicare Other | Source: Ambulatory Visit | Attending: Internal Medicine | Admitting: Internal Medicine

## 2022-04-30 DIAGNOSIS — J849 Interstitial pulmonary disease, unspecified: Secondary | ICD-10-CM | POA: Insufficient documentation

## 2022-04-30 NOTE — Progress Notes (Signed)
Daily Session Note  Patient Details  Name: Jeff Willis MRN: [id number] Date of Birth: 07-21-1940 Referring Provider:   April Manson Pulmonary Rehab Walk Test from 04/15/2022 in [facility] for Heart, Vascular, & [place]  Referring Provider [name]       Encounter Date: 04/30/2022  Check In:  Session Check In - 04/30/22 1551       Check-In   Supervising physician immediately available to respond to emergencies [facility] - Physician supervision    Physician(s) Dr. Silas [place]    Location [facility]    Staff Present Janine Ores, RN, Quentin Ore, MS, ACSM-CEP, Exercise Physiologist;Randi Yevonne Pax, ACSM-CEP, Exercise Physiologist;Lisa Ysidro Evert, RN    Virtual Visit No    Medication changes reported     No    Fall or balance concerns reported    No    Tobacco Cessation No Change    Warm-up and Cool-down Performed as group-led instruction    Resistance Training Performed Yes    VAD Patient? No    PAD/SET Patient? No      Pain Assessment   Currently in Pain? No/denies             Capillary Blood Glucose: No results found for this or any previous visit (from the past 24 hour(s)).   Exercise Prescription Changes - 04/30/22 1500       Response to Exercise   Blood Pressure (Admit) 118/65    Blood Pressure (Exercise) 104/60    Blood Pressure (Exit) 108/68    Heart Rate (Admit) 73 bpm    Heart Rate (Exercise) 94 bpm    Heart Rate (Exit) 87 bpm    Oxygen Saturation (Admit) 98 %    Oxygen Saturation (Exercise) 91 %    Oxygen Saturation (Exit) 96 %    Rating of Perceived Exertion (Exercise) 9    Perceived Dyspnea (Exercise) 2.5    Duration Continue with 30 min of aerobic exercise without signs/symptoms of physical distress.    Intensity THRR unchanged      Progression   Progression Continue to progress workloads to maintain intensity without signs/symptoms of physical distress.      Resistance Training   Training  Prescription Yes    Weight Red Bands    Reps 10-15      Oxygen   Oxygen Continuous    Liters 1      NuStep   Level 1    SPM 50    Minutes 15    METs 1.5      Track   Laps 8    Minutes 15    METs 2.23      Oxygen   Maintain Oxygen Saturation 88% or higher             Social History   Tobacco Use  Smoking Status Never  Smokeless Tobacco Never    Goals Met:  Exercise tolerated well No report of concerns or symptoms today Strength training completed today  Goals Unmet:  Not Applicable  Comments: Service time is from 1326 to [place]    Dr. Rodman Pickle is Medical Director for Pulmonary Rehab at [facility].

## 2022-05-02 ENCOUNTER — Telehealth: Payer: Self-pay | Admitting: Cardiovascular Disease

## 2022-05-02 ENCOUNTER — Encounter (HOSPITAL_COMMUNITY)
Admission: RE | Admit: 2022-05-02 | Discharge: 2022-05-02 | Disposition: A | Discharge status: Home / Self Care | Payer: Medicare Other | Source: Ambulatory Visit | Attending: Internal Medicine | Admitting: Internal Medicine

## 2022-05-02 DIAGNOSIS — J849 Interstitial pulmonary disease, unspecified: Secondary | ICD-10-CM | POA: Diagnosis not present

## 2022-05-02 NOTE — Telephone Encounter (Signed)
Spoke with Lattie Haw, RN from cardiac rehab. She is concerned because pt's heart rate is in the 120s range. Pt is asymptomatic to this and blood pressure was 126/80. Lattie Haw also states that an EKG was done and he is in sinus tachycardia. Lattie Haw confirms that pt did take his Toprol-XL '25mg'$  today. Pt is unable to participate in cardiac rehab if heart rate is out of normal limits. Talked with DOD regarding pt's tachycardia. Per DOD will not make any changes at this time. DOD advised that if pt continues to have issues with tachycardia medications may need to be titrated. Lattie Haw was relay this message to the pt and he will return to cardiac rehab on Tuesday for his next class.

## 2022-05-02 NOTE — Progress Notes (Signed)
Pulmonary Rehab [phone] On arrival pt's VS BP 120/60 oxygen saturation 98% on 1 liter HR 123. This HR was higher than he has had since attending PR. Placed him on heart monitor HR 126 ST. Pt denies any symptoms with increased HR. Pt has a history of tachycardia and has seen a cardiologist Dr. Claiborne Billings.He was last seen at cardiologist 04/11/22. He is on Toprol XL 25 mg daily. He states that he took all of his medications today. He denies use of inhaler, intake of caffeine, denies feeling increased HR, or any symptoms of sickness. He states that he has drank his usual amount of fluids today.Consulted with our supervising physician Dr. Gala Romney. She advised [place] to call the cardiologist office. I called Dr. Evette Georges office with his nurse. They felt the patient was stable to go home. Before he left PR his heart rate went to 70's SR and he remained there until discharge. Jeff Willis was discharged at 1445 with his family.VS stable. We will fax monitor strips to Dr. Evette Georges office.

## 2022-05-02 NOTE — Telephone Encounter (Signed)
STAT if HR is under 50 or over 120 (normal HR is 60-100 beats per minute)  What is your heart rate? Hr - 123 (when he checked in)  120/70 - when pt checks in   Hr - 126 right now   Do you have a log of your heart rate readings (document readings)?   Do you have any other symptoms? Cardiac rehab calling because pt hr is high and wants to know what pt should do.

## 2022-05-03 ENCOUNTER — Telehealth: Payer: Self-pay | Admitting: Cardiovascular Disease

## 2022-05-03 NOTE — Telephone Encounter (Signed)
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

## 2022-05-03 NOTE — Telephone Encounter (Addendum)
Spoke with son who stated patient could not participate in cardiac rehab yesterday due to tachycardia 126-130. Last night, P 126, BP 90/60.  While on phone, BP 120/71, P 74, Sat 98%. During rehab and up til now, patient has been asymptomatic. He is staying hydrated and limiting Na intake. No edema, SOB, dizziness, chest pain, palpitations. Son wanted to make an appointment for next week. Appointment made with Vella Raring for 1/12. Son also wanted to know if NTG can be ordered "just in case" patient has chest discomfort. Recommended that if patient becomes symptomatic, they can call cardiology on call or go to the ED. Son verbalized understanding.

## 2022-05-03 NOTE — Telephone Encounter (Signed)
STAT if HR is under 50 or over 120 (normal HR is 60-100 beats per minute)  What is your heart rate? 70  Do you have a log of your heart rate readings (document readings)?  05/02/22 8:30 ish pm 124 first time with pulse ox 128 on BP cuff 05/03/22 9:00 am 130  Do you have any other symptoms? No    Wanted to report readings since cardiac rehab incident yesterday. States he hasn't had any episodes like this before today and yesterday. Jeff Willis would also like to know if Dr. Claiborne Billings will be monitoring the patient's aneurysm moving forward due to Dr. Roxan Hockey the surgeon they were referred to stating he just felt it needed to be monitored moving forward. Please advise.

## 2022-05-03 NOTE — Telephone Encounter (Signed)
Will address at clinic visit.  Agree with recommendations as provided by Dr. Claiborne Billings and nursing team.  Loel Dubonnet, NP

## 2022-05-03 NOTE — Telephone Encounter (Signed)
Son informed of Dr. Evette Georges reply: "Very mild CAD on CTA; Await for OV to re-assess." He thanked me for the call.

## 2022-05-07 ENCOUNTER — Encounter (HOSPITAL_COMMUNITY): Payer: Medicare Other

## 2022-05-07 ENCOUNTER — Telehealth (HOSPITAL_COMMUNITY): Payer: Self-pay

## 2022-05-09 ENCOUNTER — Encounter (HOSPITAL_COMMUNITY)
Admission: RE | Admit: 2022-05-09 | Discharge: 2022-05-09 | Disposition: A | Discharge status: Home / Self Care | Payer: Medicare Other | Source: Ambulatory Visit | Attending: Internal Medicine | Admitting: Internal Medicine

## 2022-05-09 DIAGNOSIS — J849 Interstitial pulmonary disease, unspecified: Secondary | ICD-10-CM | POA: Diagnosis not present

## 2022-05-09 NOTE — Progress Notes (Signed)
Daily Session Note  Patient Details  Name: Jeff Willis MRN: [id number] Date of Birth: [birth year] Referring Provider:   April Manson Pulmonary Rehab Walk Test from 04/15/2022 in [facility]  Referring Provider [name]       Encounter Date: 05/09/2022  Check In:  Session Check In - 05/09/22 1431       Check-In   Supervising physician immediately available to respond to emergencies [facility] MD immediately available    Physician(s) Mannam    Location [facility]    Staff Present Josephina Shih BS, ACSM-CEP, Exercise Physiologist;Kaylee Rosana Hoes, MS, ACSM-CEP, Exercise Physiologist;Avner Stroder Margette Fast, RN, BSN;Jetta Walker BS, ACSM-CEP, Exercise Physiologist    Virtual Visit No    Medication changes reported     No    Fall or balance concerns reported    No    Tobacco Cessation No Change    Warm-up and Cool-down Not performed (comment)    Resistance Training Performed Yes    VAD Patient? No    PAD/SET Patient? No      Pain Assessment   Currently in Pain? No/denies    Multiple Pain Sites No             Capillary Blood Glucose: No results found for this or any previous visit (from the past 24 hour(s)).    Social History   Tobacco Use  Smoking Status Never  Smokeless Tobacco Never    Goals Met:  Exercise tolerated well Personal goals reviewed No report of concerns or symptoms today Strength training completed today  Goals Unmet:  Not Applicable  Comments: Service time is from 1318 to [place]    Dr. Rodman Pickle is Medical Director for Pulmonary Rehab at [facility].

## 2022-05-10 ENCOUNTER — Encounter (HOSPITAL_BASED_OUTPATIENT_CLINIC_OR_DEPARTMENT_OTHER): Payer: Self-pay | Admitting: Family

## 2022-05-10 ENCOUNTER — Ambulatory Visit (HOSPITAL_BASED_OUTPATIENT_CLINIC_OR_DEPARTMENT_OTHER): Payer: Medicare Other | Admitting: Family

## 2022-05-10 VITALS — BP 110/70 | HR 129 | Ht 67.0 in | Wt 114.0 lb

## 2022-05-10 DIAGNOSIS — E785 Hyperlipidemia, unspecified: Secondary | ICD-10-CM

## 2022-05-10 DIAGNOSIS — I2510 Atherosclerotic heart disease of native coronary artery without angina pectoris: Secondary | ICD-10-CM | POA: Diagnosis not present

## 2022-05-10 DIAGNOSIS — R000 Tachycardia, unspecified: Secondary | ICD-10-CM | POA: Diagnosis not present

## 2022-05-10 DIAGNOSIS — I7121 Aneurysm of the ascending aorta, without rupture: Secondary | ICD-10-CM | POA: Diagnosis not present

## 2022-05-10 DIAGNOSIS — J84112 Idiopathic pulmonary fibrosis: Secondary | ICD-10-CM | POA: Diagnosis not present

## 2022-05-10 MED ORDER — METOPROLOL SUCCINATE ER 50 MG PO TB24: 50.0000 mg | ORAL_TABLET | Freq: Every day | ORAL | 3 refills | Status: DC

## 2022-05-10 NOTE — Patient Instructions (Signed)
Medication Instructions:  Your physician has recommended you make the following change in your medication:  Increase Metoprolol Succinate to '50mg'$  daily We have sent a new prescription to your pharmacy  *If you need a refill on your cardiac medications before your next appointment, please call your pharmacy*   Lab Work: Your physician recommends that you return for lab work today: CBC, CMP, magnesium, thyroid panel  If you have labs (blood work) drawn today and your tests are completely normal, you will receive your results only by: [place] (if you have MyChart) OR A paper copy in the mail If you have any lab test that is abnormal or we need to change your treatment, we will call you to review the results.   Testing/Procedures: Recommend CT aorta after 10/24/22.   Follow-Up: At [facility], you and your health needs are our priority.  As part of our continuing mission to provide you with exceptional heart care, we have created designated Provider Care Teams.  These Care Teams include your primary Cardiologist (physician) and Advanced Practice Providers (APPs -  Physician Assistants and Nurse Practitioners) who all work together to provide you with the care you need, when you need it.  We recommend signing up for the patient portal called "MyChart".  Sign up information is provided on this After Visit Summary.  MyChart is used to connect with patients for Virtual Visits (Telemedicine).  Patients are able to view lab/test results, encounter notes, upcoming appointments, etc.  Non-urgent messages can be sent to your provider as well.   To learn more about what you can do with MyChart, go to [email or web address].    Your next appointment:   As scheduled with Dr. Claiborne Billings    Other Instructions  If heart rates remain elevated let us know and we will consider a monitor.   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  The word "aneurysm" refers to a bulge in an artery (blood vessel). Most people think of them in the context of an emergency, but yours was found incidentally. At this point there is nothing you need to do from a procedure standpoint, but there are some important things to keep in mind for day-to-day life.  Mainstays of therapy for aneurysms include very good blood pressure control, healthy lifestyle, and avoiding tobacco products and street drugs. Research has raised concern that antibiotics in the fluoroquinolone class could be associated with increased risk of having an aneurysm develop or tear. This includes medicines that end in "floxacin," like Cipro or Levaquin. Make sure to discuss this information with other healthcare providers if you require antibiotics.  Since aneurysms can run in families, you should discuss your diagnosis with first degree relatives as they may need to be screened for this. Regular mild-moderate physical exercise is important, but avoid heavy lifting/weight lifting over 30lbs, chopping wood, shoveling snow or digging heavy earth with a shovel. It is best to avoid activities that cause grunting or straining (medically referred to as a "Valsalva maneuver"). This happens when a person bears down against a closed throat to increase the strength of arm or abdominal muscles. There's often a tendency to do this when lifting heavy weights, doing sit-ups, push-ups or chin-ups, etc., but it may be harmful.  This is a finding I would expect to be monitored periodically by your cardiology team. Most unruptured thoracic aortic aneurysms cause no symptoms, so they are often found during exams for other  conditions. Contact a health care provider if you develop any discomfort in your upper back, neck, abdomen, trouble swallowing, cough or hoarseness, or unexplained weight loss. Get help right away if you develop severe pain in your upper back or abdomen that may move into your chest and arms, or  any other concerning symptoms such as shortness of breath or fever.

## 2022-05-10 NOTE — Progress Notes (Unsigned)
Office Visit    Patient Name: Jeff Willis Date of Encounter: 05/12/2022  PCP:  Gaynelle Arabian, [facility]  Cardiologist:  Shelva Majestic, MD  Advanced Practice Provider:  No care team member to display Electrophysiologist:  None      Chief Complaint    Jeff Willis is a 82 y.o. male presents today for tachycardia   Past Medical History    Past Medical History:  Diagnosis Date   Back pain    Elevated PSA    Essential tremor    Hypercholesteremia    Neuropathy    Prostate cancer ([place])    Sinus tachycardia 01/30/2018   Past Surgical History:  Procedure Laterality Date   COLONOSCOPY WITH PROPOFOL N/A 08/10/2021   Procedure: COLONOSCOPY WITH PROPOFOL;  Surgeon: Carol Ada, MD;  Location: [facility];  Service: Gastroenterology;  Laterality: N/A;   ESOPHAGEAL DILATION  08/10/2021   Procedure: ESOPHAGEAL DILATION;  Surgeon: Carol Ada, MD;  Location: [facility];  Service: Gastroenterology;;   ESOPHAGOGASTRODUODENOSCOPY (EGD) WITH PROPOFOL N/A 08/10/2021   Procedure: ESOPHAGOGASTRODUODENOSCOPY (EGD) WITH PROPOFOL;  Surgeon: Carol Ada, MD;  Location: [facility];  Service: Gastroenterology;  Laterality: N/A;   ESOPHAGOGASTRODUODENOSCOPY (EGD) WITH PROPOFOL N/A 03/12/2022   Procedure: ESOPHAGOGASTRODUODENOSCOPY (EGD) WITH PROPOFOL;  Surgeon: Carol Ada, MD;  Location: [facility];  Service: Gastroenterology;  Laterality: N/A;   HEMOSTASIS CLIP PLACEMENT  08/10/2021   Procedure: HEMOSTASIS CLIP PLACEMENT;  Surgeon: Carol Ada, MD;  Location: [facility];  Service: Gastroenterology;;   LUMBAR MICRODISCECTOMY     L4-5   POLYPECTOMY  08/10/2021   Procedure: POLYPECTOMY;  Surgeon: Carol Ada, MD;  Location: [facility];  Service: Gastroenterology;;   RADIOACTIVE SEED IMPLANT N/A 02/24/2018   Procedure: RADIOACTIVE SEED IMPLANT/BRACHYTHERAPY IMPLANT;  Surgeon: Festus Aloe, MD;  Location: [facility];  Service: Urology;  Laterality: N/A;   RIGHT HEART CATH N/A 11/22/2021   Procedure: RIGHT HEART CATH;  Surgeon: Troy Sine, MD;  Location: [facility];  Service: Cardiovascular;  Laterality: N/A;   SAVORY DILATION N/A 03/12/2022   Procedure: SAVORY DILATION;  Surgeon: Carol Ada, MD;  Location: [facility];  Service: Gastroenterology;  Laterality: N/A;   SPACE OAR INSTILLATION N/A 02/24/2018   Procedure: SPACE OAR INSTILLATION;  Surgeon: Festus Aloe, MD;  Location: [facility];  Service: Urology;  Laterality: N/A;    Allergies  Allergies  Allergen Reactions   Beef-Derived Products Other (See Comments)    Vegetarian   Chicken Protein Other (See Comments)    Vegetarian   Eggs Or Egg-Derived Products Other (See Comments)    Vegetarian   Fish-Derived Products Other (See Comments)    Vegetarian   Meat Extract Other (See Comments)    Vegetarian   Ofev [Nintedanib] Other (See Comments)    Weight loss   Pork-Derived Products Other (See Comments)    Vegetarian    History of Present Illness    Jeff Willis is a 82 y.o. male with a hx of nonobstructive coronary artery disease, hyperlipidemia, ascending aortic aneurysm, GERD, idiopathic pulmonary fibrosis last seen 04/11/22 by Ambrose Pancoast, NP.   Follows with Dr. Claiborne Billings after establishing 11/2020 due to tachycardia. Echo LVEF 60-65%, no RWMA, mildly eleavted PASP, mild to moderate TR. CT 2021 progressive ILD and aortic atherosclerosis. CT 12/08/2021 coronary calcium score of 18.  Mild dilation ascending aorta 43 mm at the mid ascending aorta. Repeat CT 12/2021 ascending thoracic aortic aneurysm 27m.  CT 04/24/2022 aneurysmal dilation of the ascending thoracic aorta 4.5 cm.  Pleasant gentleman who presents today for follow up with his son.Noted elevated heart rates prior to exercise at pulmonary rehab. For example, 05/02/22 8:30 PM HR 124. 05/03/22 9:00 AM HR 130. He is asymptomatic in regard to  tachycardia. He is taking Metoprolol '25mg'$  XL daily.   Reports stable shortness of breath and dyspnea on exertion. Reports no chest pain, pressure, or tightness. No edema, orthopnea, PND. Reports no palpitations.    EKGs/Labs/Other Studies Reviewed:   The following studies were reviewed today:   EKG:  EKG is ordered today.  The ekg ordered today demonstrates sinus tachycardia 129 bpm with left axis deviation, pulmonary pattern. No acute ST/T wave changes.   Recent Labs: 01/21/2022: Magnesium 2.2 04/02/2022: ALT 10; BUN 9; Creatinine, Ser 0.79; Hemoglobin 12.2; Platelets 229.0; Potassium 3.7; Pro B Natriuretic peptide (BNP) 199.0; Sodium 136  Recent Lipid Panel    Component Value Date/Time   CHOL 203 (H) 11/30/2020 0843   TRIG 70 01/07/2022 0500   HDL 60 11/30/2020 0843   CHOLHDL 3.4 11/30/2020 0843   LDLCALC 129 (H) 11/30/2020 0843    Home Medications   Current Meds  Medication Sig   aspirin EC 81 MG tablet Take 81 mg by mouth daily. Swallow whole.   chlorpheniramine-HYDROcodone (TUSSIONEX) 10-8 MG/5ML Take 5 mLs by mouth every 12 (twelve) hours as needed for cough.   furosemide (LASIX) 20 MG tablet Take 1 tablet (20 mg total) by mouth daily as needed for edema.   melatonin 3 MG TABS tablet Take 1 tablet (3 mg total) by mouth at bedtime as needed.   metoprolol succinate (TOPROL-XL) 50 MG 24 hr tablet Take 1 tablet (50 mg total) by mouth daily. Take with or immediately following a meal.   Multiple Vitamin (MULTIVITAMIN WITH MINERALS) TABS tablet Take 1 tablet by mouth daily.   pantoprazole (PROTONIX) 40 MG tablet Take 1 tablet (40 mg total) by mouth 2 (two) times daily.   Pirfenidone 267 MG CAPS Take 534 mg by mouth in the morning, at noon, and at bedtime.   rosuvastatin (CRESTOR) 10 MG tablet TAKE ONE TABLET BY MOUTH DAILY   Vitamin D, Ergocalciferol, (DRISDOL) 1.25 MG (50000 UNIT) CAPS capsule Take 1 capsule (50,000 Units total) by mouth every 7 (seven) days.   [DISCONTINUED]  metoprolol succinate (TOPROL-XL) 25 MG 24 hr tablet TAKE ONE TABLET BY MOUTH DAILY     Review of Systems      All other systems reviewed and are otherwise negative except as noted above.  Physical Exam    VS:  BP 110/70   Pulse (!) 129   Ht '5\' 7"'$  (1.702 m)   Wt 114 lb (51.7 kg)   BMI 17.85 kg/m  , BMI Body mass index is 17.85 kg/m.  Wt Readings from Last 3 Encounters:  05/10/22 114 lb (51.7 kg)  04/15/22 126 lb 1.7 oz (57.2 kg)  04/11/22 115 lb (52.2 kg)     GEN: Well nourished, well developed, in no acute distress. HEENT: normal. Neck: Supple, no JVD, carotid bruits, or masses. Cardiac: RRR, no murmurs, rubs, or gallops. No clubbing, cyanosis, edema.  Radials/PT 2+ and equal bilaterally.  Respiratory:  Respirations regular and unlabored, clear to auscultation bilaterally. GI: Soft, nontender, nondistended. MS: No deformity or atrophy. Skin: Warm and dry, no rash. Neuro:  Strength and sensation are intact. Psych: Normal affect.  Assessment & Plan    Tachycardia - Notes recurrence of tachycardia  with resting heart rate >130. Asymptomatic with no palpitations, lightheadedness, worsening dyspnea. Increase Toprol to '50mg'$  QD. Update CBC, CMP, magnesium, thyroid panel. Consider ZIO monitor if heart rate does not respond to Toprol. Hesitant to utilize CCB due to relative hypotension.   ILD - Follows with pulmonary. May be contributory to tachycardia.   LE edema - No recurrence. Has Lasix PRN.  Nonobstructive CAD / Aortic atherosclerosis / HLD, LDL goal <70 - Stable with no anginal symptoms. No indication for ischemic evaluation.  Continue GDMT Crestor, Aspirin, Metoprolol. Heart healthy diet and regular cardiovascular exercise encouraged.    Ascending aortic aneurysm - CT 03/2022 4.5 cm. Plan for repeat CT aorta in 6 months. Continue BP control and Metoprolol. Avoid fluoroquinolones.       Disposition: Follow up  as scheduled  with Shelva Majestic, MD or  APP.  Signed, Loel Dubonnet, NP 05/12/2022, 2:39 PM [place]

## 2022-05-11 LAB — COMPREHENSIVE METABOLIC PANEL
ALT: 10 IU/L (ref 0–44)
AST: 22 IU/L (ref 0–40)
Albumin/Globulin Ratio: 1 — ABNORMAL LOW (ref 1.2–2.2)
Albumin: 3.8 g/dL (ref 3.8–4.8)
Alkaline Phosphatase: 63 IU/L (ref 44–121)
BUN/Creatinine Ratio: 13 (ref 10–24)
BUN: 11 mg/dL (ref 8–27)
Bilirubin Total: 0.3 mg/dL (ref 0.0–1.2)
CO2: 26 mmol/L (ref 20–29)
Calcium: 9.6 mg/dL (ref 8.6–10.2)
Chloride: 96 mmol/L (ref 96–106)
Creatinine, Ser: 0.85 mg/dL (ref 0.76–1.27)
Globulin, Total: 3.9 g/dL (ref 1.5–4.5)
Glucose: 122 mg/dL — ABNORMAL HIGH (ref 70–99)
Potassium: 4.6 mmol/L (ref 3.5–5.2)
Sodium: 134 mmol/L (ref 134–144)
Total Protein: 7.7 g/dL (ref 6.0–8.5)
eGFR: 88 mL/min/{1.73_m2} (ref >59)

## 2022-05-11 LAB — THYROID PANEL WITH TSH
Free Thyroxine Index: 1.7 (ref 1.2–4.9)
T3 Uptake Ratio: 26 % (ref 24–39)
T4, Total: 6.4 ug/dL (ref 4.5–12.0)
TSH: 9.23 u[IU]/mL — ABNORMAL HIGH (ref 0.450–4.500)

## 2022-05-11 LAB — MAGNESIUM: Magnesium: 2.3 mg/dL (ref 1.6–2.3)

## 2022-05-11 LAB — CBC
Hematocrit: 39 % (ref 37.5–51.0)
Hemoglobin: 13.1 g/dL (ref 13.0–17.7)
MCH: 31.7 pg (ref 26.6–33.0)
MCHC: 33.6 g/dL (ref 31.5–35.7)
MCV: 94 fL (ref 79–97)
Platelets: 231 10*3/uL (ref 150–450)
RBC: 4.13 x10E6/uL — ABNORMAL LOW (ref 4.14–5.80)
RDW: 11.9 % (ref 11.6–15.4)
WBC: 8.4 10*3/uL (ref 3.4–10.8)

## 2022-05-11 LAB — LDL CHOLESTEROL, DIRECT: LDL Direct: 70 mg/dL (ref 0–99)

## 2022-05-13 NOTE — Addendum Note (Signed)
Addended by: Gerald Stabs on: 05/13/2022 10:10 AM   Modules accepted: Orders

## 2022-05-14 ENCOUNTER — Telehealth (HOSPITAL_BASED_OUTPATIENT_CLINIC_OR_DEPARTMENT_OTHER): Payer: Self-pay

## 2022-05-14 ENCOUNTER — Encounter (HOSPITAL_COMMUNITY)
Admission: RE | Admit: 2022-05-14 | Discharge: 2022-05-14 | Disposition: A | Discharge status: Home / Self Care | Payer: Medicare Other | Source: Ambulatory Visit | Attending: Internal Medicine | Admitting: Internal Medicine

## 2022-05-14 VITALS — Wt 113.8 lb

## 2022-05-14 DIAGNOSIS — J849 Interstitial pulmonary disease, unspecified: Secondary | ICD-10-CM | POA: Diagnosis not present

## 2022-05-14 DIAGNOSIS — R7989 Other specified abnormal findings of blood chemistry: Secondary | ICD-10-CM

## 2022-05-14 NOTE — Progress Notes (Signed)
Daily Session Note  Patient Details  Name: Jeff Willis MRN: [id number] Date of Birth: Aug 09, 1940 Referring Provider:   April Manson Pulmonary Rehab Walk Test from 04/15/2022 in [facility] [place]  Referring Provider [name]       Encounter Date: 05/14/2022  Check In:  Session Check In - 05/14/22 1431       Check-In   Supervising physician immediately available to respond to emergencies [facility] MD immediately available    Physician(s) Eric Form, NP    Location [facility]    Staff Present Josephina Shih BS, ACSM-CEP, Exercise Physiologist;Teala Daffron Rosana Hoes, MS, ACSM-CEP, Exercise Physiologist;Mary Margette Fast, RN, BSN;Other    Virtual Visit No    Medication changes reported     No    Fall or balance concerns reported    No    Tobacco Cessation No Change    Warm-up and Cool-down Performed as group-led instruction    Resistance Training Performed Yes    VAD Patient? No    PAD/SET Patient? No      Pain Assessment   Currently in Pain? No/denies    Multiple Pain Sites No             Capillary Blood Glucose: No results found for this or any previous visit (from the past 24 hour(s)).   Exercise Prescription Changes - 05/14/22 1500       Response to Exercise   Blood Pressure (Admit) 110/60    Blood Pressure (Exercise) 108/60    Blood Pressure (Exit) 98/62    Heart Rate (Admit) 75 bpm    Heart Rate (Exercise) 130 bpm    Heart Rate (Exit) 127 bpm    Oxygen Saturation (Admit) 98 %    Oxygen Saturation (Exercise) 91 %    Oxygen Saturation (Exit) 97 %    Rating of Perceived Exertion (Exercise) 9    Perceived Dyspnea (Exercise) 1    Comments --   high HR cardiology aware   Duration Continue with 30 min of aerobic exercise without signs/symptoms of physical distress.    Intensity THRR unchanged      Progression   Progression Continue to progress workloads to maintain intensity without signs/symptoms of  physical distress.      Resistance Training   Training Prescription Yes    Weight Red Bands    Reps 10-15    Time 10 Minutes      Oxygen   Oxygen Continuous    Liters 1      NuStep   Level 2    SPM 50    Minutes 15    METs --   did not get METs     Track   Laps 4    Minutes 10    METs 1.92             Social History   Tobacco Use  Smoking Status Never  Smokeless Tobacco Never    Goals Met:  Proper associated with RPD/PD & O2 Sat Exercise tolerated well No report of concerns or symptoms today Strength training completed today  Goals Unmet:  Not Applicable  Comments: Service time is from 1319 to 1445.    Dr. Rodman Pickle is Medical Director for Pulmonary Rehab at [facility]

## 2022-05-14 NOTE — Telephone Encounter (Addendum)
Results called to patient who verbalizes understanding! Labs mailed to patient.   ----- Message from Loel Dubonnet, NP sent at 05/14/2022  1:54 PM EST ----- CBC with improved anemia. TSH elevated but T3/T4 normal. Recommend rechecking thyroid panel in a couple months for monitoring. Normal kidneys, electrolytes, liver. Cholesterol looks good - continue same dose Rosuvastatin.

## 2022-05-15 NOTE — Progress Notes (Signed)
Pulmonary Individual Treatment Plan  Patient Details  Name: Jeff Willis MRN: [id number] Date of Birth: 07/25/1940 Referring Provider:   April Manson Pulmonary Rehab Walk Test from 04/15/2022 in [facility] [place]  Referring Provider [name]       Initial Encounter Date:  Flowsheet Row Pulmonary Rehab Walk Test from 04/15/2022 in [facility] [place]  Date 04/15/22       Visit Diagnosis: ILD (interstitial lung disease) ([place])  Patient's Home Medications on Admission:   Current Outpatient Medications:    aspirin EC 81 MG tablet, Take 81 mg by mouth daily. Swallow whole., Disp: , Rfl:    chlorpheniramine-HYDROcodone (TUSSIONEX) 10-8 MG/5ML, Take 5 mLs by mouth every 12 (twelve) hours as needed for cough., Disp: 300 mL, Rfl: 0   furosemide (LASIX) 20 MG tablet, Take 1 tablet (20 mg total) by mouth daily as needed for edema., Disp: 30 tablet, Rfl: 1   melatonin 3 MG TABS tablet, Take 1 tablet (3 mg total) by mouth at bedtime as needed., Disp: , Rfl: 0   metoprolol succinate (TOPROL-XL) 50 MG 24 hr tablet, Take 1 tablet (50 mg total) by mouth daily. Take with or immediately following a meal., Disp: 90 tablet, Rfl: 3   Multiple Vitamin (MULTIVITAMIN WITH MINERALS) TABS tablet, Take 1 tablet by mouth daily., Disp: , Rfl:    pantoprazole (PROTONIX) 40 MG tablet, Take 1 tablet (40 mg total) by mouth 2 (two) times daily., Disp: 30 tablet, Rfl: 2   Pirfenidone 267 MG CAPS, Take 534 mg by mouth in the morning, at noon, and at bedtime., Disp: , Rfl:    rosuvastatin (CRESTOR) 10 MG tablet, TAKE ONE TABLET BY MOUTH DAILY, Disp: 90 tablet, Rfl: 3   Vitamin D, Ergocalciferol, (DRISDOL) 1.25 MG (50000 UNIT) CAPS capsule, Take 1 capsule (50,000 Units total) by mouth every 7 (seven) days., Disp: 5 capsule, Rfl: 0  Past Medical History: Past Medical History:  Diagnosis Date   Back pain     Elevated PSA    Essential tremor    Hypercholesteremia    Neuropathy    Prostate cancer ([place])    Sinus tachycardia 01/30/2018    Tobacco Use: Social History   Tobacco Use  Smoking Status Never  Smokeless Tobacco Never    Labs: Review Flowsheet  More data exists      Latest Ref Rng & Units 01/02/2022 01/03/2022 01/04/2022 01/07/2022 05/10/2022  Labs for ITP Cardiac and Pulmonary Rehab  Direct LDL 0 - 99 mg/dL - - - - 70   Trlycerides <150 mg/dL - - 36  70  -  PH, Arterial 7.35 - 7.45 7.366  7.362  7.428  - - -  PCO2 arterial 32 - 48 mmHg 52.8  55.6  49.0  - - -  Bicarbonate 20.0 - 28.0 mmol/L 30.5  31.7  32.5  - - -  TCO2 22 - 32 mmol/L 32  33  34  - - -  O2 Saturation % 99  91  99  - - -    Capillary Blood Glucose: Lab Results  Component Value Date   GLUCAP 115 (H) 03/12/2022   GLUCAP 76 01/28/2022   GLUCAP 174 (H) 01/27/2022   GLUCAP 151 (H) 01/27/2022   GLUCAP 115 (H) 01/27/2022     Pulmonary Assessment Scores:  Pulmonary Assessment Scores     Row Name 04/15/22 1111  ADL UCSD   ADL Phase Entry     SOB Score total 71       CAT Score   CAT Score 11       mMRC Score   mMRC Score 0             UCSD: Self-administered rating of dyspnea associated with activities of daily living (ADLs) 6-point scale (0 = "not at all" to 5 = "maximal or unable to do because of breathlessness")  Scoring Scores range from 0 to 120.  Minimally important difference is 5 units  CAT: CAT can identify the health impairment of COPD patients and is better correlated with disease progression.  CAT has a scoring range of zero to 40. The CAT score is classified into four groups of low (less than 10), medium (10 - 20), high (21-30) and very high (31-40) based on the impact level of disease on health status. A CAT score over 10 suggests significant symptoms.  A worsening CAT score could be explained by an exacerbation, poor medication adherence, poor inhaler technique, or  progression of COPD or comorbid conditions.  CAT MCID is 2 points  mMRC: mMRC (Modified Medical Research Council) Dyspnea Scale is used to assess the degree of baseline functional disability in patients of respiratory disease due to dyspnea. No minimal important difference is established. A decrease in score of 1 point or greater is considered a positive change.   Pulmonary Function Assessment:  Pulmonary Function Assessment - 04/15/22 1110       Breath   Bilateral Breath Sounds Clear;Decreased    Shortness of Breath No             Exercise Target Goals: Exercise Program Goal: Individual exercise prescription set using results from initial 6 min walk test and THRR while considering  patient's activity barriers and safety.   Exercise Prescription Goal: Initial exercise prescription builds to 30-45 minutes a day of aerobic activity, 2-3 days per week.  Home exercise guidelines will be given to patient during program as part of exercise prescription that the participant will acknowledge.  Activity Barriers & Risk Stratification:  Activity Barriers & Cardiac Risk Stratification - 04/15/22 1111       Activity Barriers & Cardiac Risk Stratification   Activity Barriers Back Problems;Shortness of Breath;Muscular Weakness;Deconditioning             6 Minute Walk:  6 Minute Walk     Row Name 04/15/22 1215         6 Minute Walk   Phase Initial     Distance 535 feet     Walk Time 6 minutes     # of Rest Breaks 0     MPH 1.01     METS 1.44     RPE 9     Perceived Dyspnea  0     VO2 Peak 5.04     Symptoms No     Resting HR 76 bpm     Resting BP 124/62     Resting Oxygen Saturation  97 %     Exercise Oxygen Saturation  during 6 min walk 90 %     Max Ex. HR 104 bpm     Max Ex. BP 122/64     2 Minute Post BP 118/60       Interval HR   1 Minute HR 89     2 Minute HR 92     3 Minute HR 94  4 Minute HR 94     5 Minute HR 104     6 Minute HR 96     2 Minute  Post HR 81     Interval Heart Rate? Yes       Interval Oxygen   Interval Oxygen? Yes     Baseline Oxygen Saturation % 97 %  On 1L. Decreased to no O2     1 Minute Oxygen Saturation % 93 %     1 Minute Liters of Oxygen 0 L     2 Minute Oxygen Saturation % 92 %     2 Minute Liters of Oxygen 0 L     3 Minute Oxygen Saturation % 91 %     3 Minute Liters of Oxygen 0 L     4 Minute Oxygen Saturation % 90 %     4 Minute Liters of Oxygen 0 L     5 Minute Oxygen Saturation % 90 %     5 Minute Liters of Oxygen 0 L     6 Minute Oxygen Saturation % 90 %     6 Minute Liters of Oxygen 0 L     2 Minute Post Oxygen Saturation % 95 %     2 Minute Post Liters of Oxygen 0 L              Oxygen Initial Assessment:  Oxygen Initial Assessment - 04/15/22 1108       Home Oxygen   Home Oxygen Device Portable Concentrator;Home Concentrator    Sleep Oxygen Prescription Continuous    Liters per minute 1    Home Exercise Oxygen Prescription Pulsed    Liters per minute 2    Home Resting Oxygen Prescription Pulsed    Liters per minute 1    Compliance with Home Oxygen Use Yes      Initial 6 min Walk   Oxygen Used Continuous    Liters per minute 1      Program Oxygen Prescription   Program Oxygen Prescription Continuous    Liters per minute 1      Intervention   Short Term Goals To learn and exhibit compliance with exercise, home and travel O2 prescription;To learn and understand importance of maintaining oxygen saturations>88%;To learn and demonstrate proper use of respiratory medications;To learn and demonstrate proper pursed lip breathing techniques or other breathing techniques. ;To learn and understand importance of monitoring SPO2 with pulse oximeter and demonstrate accurate use of the pulse oximeter.    Long  Term Goals Exhibits compliance with exercise, home  and travel O2 prescription;Verbalizes importance of monitoring SPO2 with pulse oximeter and return demonstration;Maintenance of O2  saturations>88%;Exhibits proper breathing techniques, such as pursed lip breathing or other method taught during program session;Compliance with respiratory medication             Oxygen Re-Evaluation:  Oxygen Re-Evaluation     Row Name 04/16/22 0935 05/10/22 1022           Program Oxygen Prescription   Program Oxygen Prescription Continuous Continuous      Liters per minute 1 1        Home Oxygen   Home Oxygen Device Portable Concentrator;Home Concentrator Portable Concentrator;Home Concentrator      Sleep Oxygen Prescription Continuous Continuous      Liters per minute 1 1      Home Exercise Oxygen Prescription Pulsed Pulsed      Liters per minute 2 2  Home Resting Oxygen Prescription Pulsed Pulsed      Liters per minute 1 1      Compliance with Home Oxygen Use Yes Yes        Goals/Expected Outcomes   Short Term Goals To learn and exhibit compliance with exercise, home and travel O2 prescription;To learn and understand importance of maintaining oxygen saturations>88%;To learn and demonstrate proper use of respiratory medications;To learn and demonstrate proper pursed lip breathing techniques or other breathing techniques. ;To learn and understand importance of monitoring SPO2 with pulse oximeter and demonstrate accurate use of the pulse oximeter. To learn and exhibit compliance with exercise, home and travel O2 prescription;To learn and understand importance of maintaining oxygen saturations>88%;To learn and demonstrate proper use of respiratory medications;To learn and demonstrate proper pursed lip breathing techniques or other breathing techniques. ;To learn and understand importance of monitoring SPO2 with pulse oximeter and demonstrate accurate use of the pulse oximeter.      Long  Term Goals Exhibits compliance with exercise, home  and travel O2 prescription;Verbalizes importance of monitoring SPO2 with pulse oximeter and return demonstration;Maintenance of O2  saturations>88%;Exhibits proper breathing techniques, such as pursed lip breathing or other method taught during program session;Compliance with respiratory medication Exhibits compliance with exercise, home  and travel O2 prescription;Verbalizes importance of monitoring SPO2 with pulse oximeter and return demonstration;Maintenance of O2 saturations>88%;Exhibits proper breathing techniques, such as pursed lip breathing or other method taught during program session;Compliance with respiratory medication      Comments -- no change      Goals/Expected Outcomes Compliance and understanding of oxygen saturation monitoring and breathing techniques to decrease shortness of breath. Compliance and understanding of oxygen saturation monitoring and breathing techniques to decrease shortness of breath.               Oxygen Discharge (Final Oxygen Re-Evaluation):  Oxygen Re-Evaluation - 05/10/22 1022       Program Oxygen Prescription   Program Oxygen Prescription Continuous    Liters per minute 1      Home Oxygen   Home Oxygen Device Portable Concentrator;Home Concentrator    Sleep Oxygen Prescription Continuous    Liters per minute 1    Home Exercise Oxygen Prescription Pulsed    Liters per minute 2    Home Resting Oxygen Prescription Pulsed    Liters per minute 1    Compliance with Home Oxygen Use Yes      Goals/Expected Outcomes   Short Term Goals To learn and exhibit compliance with exercise, home and travel O2 prescription;To learn and understand importance of maintaining oxygen saturations>88%;To learn and demonstrate proper use of respiratory medications;To learn and demonstrate proper pursed lip breathing techniques or other breathing techniques. ;To learn and understand importance of monitoring SPO2 with pulse oximeter and demonstrate accurate use of the pulse oximeter.    Long  Term Goals Exhibits compliance with exercise, home  and travel O2 prescription;Verbalizes importance of  monitoring SPO2 with pulse oximeter and return demonstration;Maintenance of O2 saturations>88%;Exhibits proper breathing techniques, such as pursed lip breathing or other method taught during program session;Compliance with respiratory medication    Comments no change    Goals/Expected Outcomes Compliance and understanding of oxygen saturation monitoring and breathing techniques to decrease shortness of breath.             Initial Exercise Prescription:  Initial Exercise Prescription - 04/15/22 1200       Date of Initial Exercise RX and Referring Provider   Date 04/15/22  Referring Provider [name]    Expected Discharge Date 06/20/22      Oxygen   Oxygen Continuous    Liters 1    Maintain Oxygen Saturation 88% or higher      NuStep   Level 1    SPM 50    Minutes 15      Track   Minutes 15    METs 1.44      Prescription Details   Frequency (times per week) 2    Duration Progress to 30 minutes of continuous aerobic without signs/symptoms of physical distress      Intensity   THRR 40-80% of Max Heartrate 56-112    Ratings of Perceived Exertion 11-13    Perceived Dyspnea 0-4      Progression   Progression Continue to progress workloads to maintain intensity without signs/symptoms of physical distress.      Resistance Training   Training Prescription Yes    Weight red bands    Reps 10-15             Perform Capillary Blood Glucose checks as needed.  Exercise Prescription Changes:   Exercise Prescription Changes     Row Name 04/30/22 1500 05/14/22 1500           Response to Exercise   Blood Pressure (Admit) 118/65 110/60      Blood Pressure (Exercise) 104/60 108/60      Blood Pressure (Exit) 108/68 98/62      Heart Rate (Admit) 73 bpm 75 bpm      Heart Rate (Exercise) 94 bpm 130 bpm      Heart Rate (Exit) 87 bpm 127 bpm      Oxygen Saturation (Admit) 98 % 98 %      Oxygen Saturation (Exercise) 91 % 91 %      Oxygen Saturation (Exit) 96 % 97 %       Rating of Perceived Exertion (Exercise) 9 9      Perceived Dyspnea (Exercise) 2.5 1      Comments -- --  high HR cardiology aware      Duration Continue with 30 min of aerobic exercise without signs/symptoms of physical distress. Continue with 30 min of aerobic exercise without signs/symptoms of physical distress.      Intensity THRR unchanged THRR unchanged        Progression   Progression Continue to progress workloads to maintain intensity without signs/symptoms of physical distress. Continue to progress workloads to maintain intensity without signs/symptoms of physical distress.        Resistance Training   Training Prescription Yes Yes      Weight Red Bands Red Bands      Reps 10-15 10-15      Time -- 10 Minutes        Oxygen   Oxygen Continuous Continuous      Liters 1 1        NuStep   Level 1 2      SPM 50 50      Minutes 15 15      METs 1.5 --  did not get METs        Track   Laps 8 4      Minutes 15 10      METs 2.23 1.92        Oxygen   Maintain Oxygen Saturation 88% or higher --               Exercise Comments:  Exercise Comments     Row Name 04/23/22 1601           Exercise Comments Pt completed first day of exercise. He is weak and needs a cane or RW walking plus assistance carrying O2 carrier. He exercised on recumbent stepper for 15 min, level 1, METs 1.0. Then he walked the track pushing the wheelchair, 5 laps, METs 1.77. Pt surprised himself with walking, tolerated fairly well, albeit slow pace. He performed warm up and cool down standing with demonstrative and verbal cues. He performed squats with assist. Discussed METs with good reception. Will progress as tolerated.                Exercise Goals and Review:   Exercise Goals     Row Name 04/15/22 1117 04/16/22 0934 05/10/22 1017         Exercise Goals   Increase Physical Activity Yes Yes Yes     Intervention Provide advice, education, support and counseling about physical  activity/exercise needs.;Develop an individualized exercise prescription for aerobic and resistive training based on initial evaluation findings, risk stratification, comorbidities and participant's personal goals. Provide advice, education, support and counseling about physical activity/exercise needs.;Develop an individualized exercise prescription for aerobic and resistive training based on initial evaluation findings, risk stratification, comorbidities and participant's personal goals. Provide advice, education, support and counseling about physical activity/exercise needs.;Develop an individualized exercise prescription for aerobic and resistive training based on initial evaluation findings, risk stratification, comorbidities and participant's personal goals.     Expected Outcomes Short Term: Attend rehab on a regular basis to increase amount of physical activity.;Long Term: Exercising regularly at least 3-5 days a week.;Long Term: Add in home exercise to make exercise part of routine and to increase amount of physical activity. Short Term: Attend rehab on a regular basis to increase amount of physical activity.;Long Term: Exercising regularly at least 3-5 days a week.;Long Term: Add in home exercise to make exercise part of routine and to increase amount of physical activity. Short Term: Attend rehab on a regular basis to increase amount of physical activity.;Long Term: Exercising regularly at least 3-5 days a week.;Long Term: Add in home exercise to make exercise part of routine and to increase amount of physical activity.     Increase Strength and Stamina Yes Yes Yes     Intervention Provide advice, education, support and counseling about physical activity/exercise needs.;Develop an individualized exercise prescription for aerobic and resistive training based on initial evaluation findings, risk stratification, comorbidities and participant's personal goals. Provide advice, education, support and  counseling about physical activity/exercise needs.;Develop an individualized exercise prescription for aerobic and resistive training based on initial evaluation findings, risk stratification, comorbidities and participant's personal goals. Provide advice, education, support and counseling about physical activity/exercise needs.;Develop an individualized exercise prescription for aerobic and resistive training based on initial evaluation findings, risk stratification, comorbidities and participant's personal goals.     Expected Outcomes Short Term: Increase workloads from initial exercise prescription for resistance, speed, and METs.;Short Term: Perform resistance training exercises routinely during rehab and add in resistance training at home;Long Term: Improve cardiorespiratory fitness, muscular endurance and strength as measured by increased METs and functional capacity (6MWT) Short Term: Increase workloads from initial exercise prescription for resistance, speed, and METs.;Short Term: Perform resistance training exercises routinely during rehab and add in resistance training at home;Long Term: Improve cardiorespiratory fitness, muscular endurance and strength as measured by increased METs and functional capacity (6MWT) Short Term: Increase workloads from initial exercise prescription  for resistance, speed, and METs.;Short Term: Perform resistance training exercises routinely during rehab and add in resistance training at home;Long Term: Improve cardiorespiratory fitness, muscular endurance and strength as measured by increased METs and functional capacity (6MWT)     Able to understand and use rate of perceived exertion (RPE) scale Yes Yes Yes     Intervention Provide education and explanation on how to use RPE scale Provide education and explanation on how to use RPE scale Provide education and explanation on how to use RPE scale     Expected Outcomes Short Term: Able to use RPE daily in rehab to express  subjective intensity level;Long Term:  Able to use RPE to guide intensity level when exercising independently Short Term: Able to use RPE daily in rehab to express subjective intensity level;Long Term:  Able to use RPE to guide intensity level when exercising independently Short Term: Able to use RPE daily in rehab to express subjective intensity level;Long Term:  Able to use RPE to guide intensity level when exercising independently     Able to understand and use Dyspnea scale Yes Yes Yes     Intervention Provide education and explanation on how to use Dyspnea scale Provide education and explanation on how to use Dyspnea scale Provide education and explanation on how to use Dyspnea scale     Expected Outcomes Short Term: Able to use Dyspnea scale daily in rehab to express subjective sense of shortness of breath during exertion;Long Term: Able to use Dyspnea scale to guide intensity level when exercising independently Short Term: Able to use Dyspnea scale daily in rehab to express subjective sense of shortness of breath during exertion;Long Term: Able to use Dyspnea scale to guide intensity level when exercising independently Short Term: Able to use Dyspnea scale daily in rehab to express subjective sense of shortness of breath during exertion;Long Term: Able to use Dyspnea scale to guide intensity level when exercising independently     Knowledge and understanding of Target Heart Rate Range (THRR) Yes Yes Yes     Intervention Provide education and explanation of THRR including how the numbers were predicted and where they are located for reference Provide education and explanation of THRR including how the numbers were predicted and where they are located for reference Provide education and explanation of THRR including how the numbers were predicted and where they are located for reference     Expected Outcomes Short Term: Able to state/look up THRR;Long Term: Able to use THRR to govern intensity when  exercising independently;Short Term: Able to use daily as guideline for intensity in rehab Short Term: Able to state/look up THRR;Long Term: Able to use THRR to govern intensity when exercising independently;Short Term: Able to use daily as guideline for intensity in rehab Short Term: Able to state/look up THRR;Long Term: Able to use THRR to govern intensity when exercising independently;Short Term: Able to use daily as guideline for intensity in rehab     Understanding of Exercise Prescription Yes Yes Yes     Intervention Provide education, explanation, and written materials on patient's individual exercise prescription Provide education, explanation, and written materials on patient's individual exercise prescription Provide education, explanation, and written materials on patient's individual exercise prescription     Expected Outcomes Short Term: Able to explain program exercise prescription;Long Term: Able to explain home exercise prescription to exercise independently Short Term: Able to explain program exercise prescription;Long Term: Able to explain home exercise prescription to exercise independently Short Term: Able to explain  program exercise prescription;Long Term: Able to explain home exercise prescription to exercise independently              Exercise Goals Re-Evaluation :  Exercise Goals Re-Evaluation     Row Name 04/16/22 0934 05/10/22 1017           Exercise Goal Re-Evaluation   Exercise Goals Review Increase Physical Activity;Able to understand and use Dyspnea scale;Understanding of Exercise Prescription;Increase Strength and Stamina;Knowledge and understanding of Target Heart Rate Range (THRR);Able to understand and use rate of perceived exertion (RPE) scale Increase Physical Activity;Able to understand and use Dyspnea scale;Understanding of Exercise Prescription;Increase Strength and Stamina;Knowledge and understanding of Target Heart Rate Range (THRR);Able to understand and  use rate of perceived exertion (RPE) scale      Comments Pt will begin exercise next week. Will monitor and progress as tolerated. Pt has completed 4 exercise sessions. He missed two sessions, one due to tachycardia and one due to weather. Pt is exercising on the recumbent stepper for 15 min, level 2, METs 1.7. He then walks the track pushing a walker or wheelchair for 15 min, METs 2.08. He performs warm up and cool down standing with demonstrative cues. He is using red bands for resistance training. He is slowly progressing.  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 Will monitor and progress as tolerated.      Expected Outcomes Through exercise at rehab and home, the patient will decrease shortness of breath with daily activities and feel confident in carrying out an exercise regimen at home. Through exercise at rehab and home, the patient will decrease shortness of breath with daily activities and feel confident in carrying out an exercise regimen at home.               Discharge Exercise Prescription (Final Exercise Prescription Changes):  Exercise Prescription Changes - 05/14/22 1500       Response to Exercise   Blood Pressure (Admit) 110/60    Blood Pressure (Exercise) 108/60    Blood Pressure (Exit) 98/62    Heart Rate (Admit) 75 bpm    Heart Rate (Exercise) 130 bpm    Heart Rate (Exit) 127 bpm    Oxygen Saturation (Admit) 98 %    Oxygen Saturation (Exercise) 91 %    Oxygen Saturation (Exit) 97 %    Rating of Perceived Exertion (Exercise) 9    Perceived Dyspnea (Exercise) 1    Comments --   high HR cardiology aware   Duration Continue with 30 min of aerobic exercise without signs/symptoms of physical distress.    Intensity THRR unchanged      Progression   Progression Continue to progress workloads to maintain intensity without signs/symptoms of physical distress.      Resistance Training   Training Prescription Yes    Weight Red Bands    Reps 10-15    Time 10 Minutes       Oxygen   Oxygen Continuous    Liters 1      NuStep   Level 2    SPM 50    Minutes 15    METs --   did not get METs     Track   Laps 4    Minutes 10    METs 1.92             Nutrition:  Target Goals: Understanding of nutrition guidelines, daily intake of sodium '1500mg'$ , cholesterol '200mg'$ , calories 30% from fat and 7% or less from saturated fats,  daily to have 5 or more servings of fruits and vegetables.  Biometrics:    Nutrition Therapy Plan and Nutrition Goals:  Nutrition Therapy & Goals - 05/02/22 1453       Nutrition Therapy   Diet General Healthy Diet    Drug/Food Interactions Statins/Certain Fruits      Personal Nutrition Goals   Nutrition Goal Patient to identify strategies for weight gain of 0.5-2.0# per week    Comments "Chet" reports typcial adult weight ~125-140# (124# on 11/14/2021, 138# 08/04/2019) with significant weight loss and diagnosis of protein calorie malnutrition since hospitalization in September 2023. He reports eating three meals daily and 1 Ensure daily. He does reports decrease appetite overall. He lives at home with his wife and has excellent support from his son and daughter in law as well. Patient will continue to benefit from participating in pulmonary rehab for nutrition education and exercise.      Intervention Plan   Intervention Prescribe, educate and counsel regarding individualized specific dietary modifications aiming towards targeted core components such as weight, hypertension, lipid management, diabetes, heart failure and other comorbidities.;Nutrition handout(s) given to patient.    Expected Outcomes Short Term Goal: Understand basic principles of dietary content, such as calories, fat, sodium, cholesterol and nutrients.;Long Term Goal: Adherence to prescribed nutrition plan.             Nutrition Assessments:  Nutrition Assessments - 04/30/22 0921       Rate Your Plate Scores   Pre Score 53            MEDIFICTS  Score Key: ?70 Need to make dietary changes  40-70 Heart Healthy Diet ? 40 Therapeutic Level Cholesterol Diet  Flowsheet Row PULMONARY REHAB OTHER RESPIRATORY from 04/25/2022 in [facility], Vascular, & Lung Health  Picture Your Plate Total Score on Admission 53      Picture Your Plate Scores: <73 Unhealthy dietary pattern with much room for improvement. 41-50 Dietary pattern unlikely to meet recommendations for good health and room for improvement. 51-60 More healthful dietary pattern, with some room for improvement.  >60 Healthy dietary pattern, although there may be some specific behaviors that could be improved.    Nutrition Goals Re-Evaluation:  Nutrition Goals Re-Evaluation     [place] Name 05/02/22 1453             Goals   Current Weight 112 lb 7 oz (51 kg)       Comment albumin 3.3, vitamin D 28.5       Expected Outcome "Chet" reports typcial adult weight ~125-140# (124# on 11/14/2021, 138# 08/04/2019) with significant weight loss and diagnosis of protein calorie malnutrition since hospitalization in September 2023. He reports eating three meals daily and 1 Ensure daily. He does reports decrease appetite overall. He lives at home with his wife and has excellent support from his son and daughter in law as well. Patient will continue to benefit from participating in pulmonary rehab for nutrition education and exercise.                Nutrition Goals Discharge (Final Nutrition Goals Re-Evaluation):  Nutrition Goals Re-Evaluation - 05/02/22 1453       Goals   Current Weight 112 lb 7 oz (51 kg)    Comment albumin 3.3, vitamin D 28.5    Expected Outcome "Chet" reports typcial adult weight ~125-140# (124# on 11/14/2021, 138# 08/04/2019) with significant weight loss and diagnosis of protein calorie malnutrition since hospitalization  in September 2023. He reports eating three meals daily and 1 Ensure daily. He does reports decrease appetite overall.  He lives at home with his wife and has excellent support from his son and daughter in law as well. Patient will continue to benefit from participating in pulmonary rehab for nutrition education and exercise.             Psychosocial: Target Goals: Acknowledge presence or absence of significant depression and/or stress, maximize coping skills, provide positive support system. Participant is able to verbalize types and ability to use techniques and skills needed for reducing stress and depression.  Initial Review & Psychosocial Screening:  Initial Psych Review & Screening - 04/15/22 1057       Initial Review   Current issues with None Identified      Family Dynamics   Good Support System? Yes    Comments spouse and children      Barriers   Psychosocial barriers to participate in program There are no identifiable barriers or psychosocial needs.;The patient should benefit from training in stress management and relaxation.      Screening Interventions   Interventions Encouraged to exercise             Quality of Life Scores:  Scores of 19 and below usually indicate a poorer quality of life in these areas.  A difference of  2-3 points is a clinically meaningful difference.  A difference of 2-3 points in the total score of the Quality of Life Index has been associated with significant improvement in overall quality of life, self-image, physical symptoms, and general health in studies assessing change in quality of life.  PHQ-9: Review Flowsheet       04/15/2022 10/08/2017  Depression screen PHQ 2/9  Decreased Interest 0 0  Down, Depressed, Hopeless 0 0  PHQ - 2 Score 0 0  Altered sleeping 0 -  Tired, decreased energy 0 -  Change in appetite 0 -  Feeling bad or failure about yourself  0 -  Trouble concentrating 0 -  Moving slowly or fidgety/restless 0 -  Suicidal thoughts 0 -  PHQ-9 Score 0 -  Difficult doing work/chores Not difficult at all -   Interpretation of Total  Score  Total Score Depression Severity:  1-4 = Minimal depression, 5-9 = Mild depression, 10-14 = Moderate depression, 15-19 = Moderately severe depression, 20-27 = Severe depression   Psychosocial Evaluation and Intervention:  Psychosocial Evaluation - 04/15/22 1058       Psychosocial Evaluation & Interventions   Interventions Encouraged to exercise with the program and follow exercise prescription;Stress management education;Relaxation education    Comments Jeff Willis denies psychosocial barriers to PR    Expected Outcomes For Jeff Willis to participate in PR free of psychosocial barriers.    Continue Psychosocial Services  No Follow up required             Psychosocial Re-Evaluation:  Psychosocial Re-Evaluation     [place] Name 04/17/22 1400 05/15/22 1101           Psychosocial Re-Evaluation   Current issues with None Identified None Identified      Comments Bryan has completed orientation to the PR program. He is scheduled to start exercising 04/23/22 at 1:15 pm. We will continue to evaluate for any psychosocial barriers as he attends. Jeff Willis has completed 6 PR classes. His son or daughter-in-law bring him to/from class. He and his wife are currently living with his son, daughter-in-law and grandkids. He states that  he has great support from them. Jeff Willis currently denies any psychosocial barriers or concerns. We will continue to monitor and evaluate him during his PR sessions.      Expected Outcomes For him to participate in the program without any psychosocial issues or concerns. For Jeff Willis to participate in the program without any psychosocial issues or concerns.      Interventions Encouraged to attend Pulmonary Rehabilitation for the exercise Encouraged to attend Pulmonary Rehabilitation for the exercise      Continue Psychosocial Services  No Follow up required No Follow up required               Psychosocial Discharge (Final Psychosocial Re-Evaluation):  Psychosocial  Re-Evaluation - 05/15/22 1101       Psychosocial Re-Evaluation   Current issues with None Identified    Comments Jeff Willis has completed 6 PR classes. His son or daughter-in-law bring him to/from class. He and his wife are currently living with his son, daughter-in-law and grandkids. He states that he has great support from them. Jeff Willis currently denies any psychosocial barriers or concerns. We will continue to monitor and evaluate him during his PR sessions.    Expected Outcomes For Jeff Willis to participate in the program without any psychosocial issues or concerns.    Interventions Encouraged to attend Pulmonary Rehabilitation for the exercise    Continue Psychosocial Services  No Follow up required             Education: Education Goals: Education classes will be provided on a weekly basis, covering required topics. Participant will state understanding/return demonstration of topics presented.  Learning Barriers/Preferences:  Learning Barriers/Preferences - 04/15/22 1100       Learning Barriers/Preferences   Learning Barriers Sight   reading glasses   Learning Preferences Skilled Demonstration             Education Topics: Introduction to Pulmonary Rehab Group instruction provided by PowerPoint, verbal discussion, and written material to support subject matter. Instructor reviews what Pulmonary Rehab is, the purpose of the program, and how patients are referred.     Know Your Numbers Group instruction that is supported by a PowerPoint presentation. Instructor discusses importance of knowing and understanding resting, exercise, and post-exercise oxygen saturation, heart rate, and blood pressure. Oxygen saturation, heart rate, blood pressure, rating of perceived exertion, and dyspnea are reviewed along with a normal range for these values.    Exercise for the Pulmonary Patient Group instruction that is supported by a PowerPoint presentation. Instructor discusses benefits of  exercise, core components of exercise, frequency, duration, and intensity of an exercise routine, importance of utilizing pulse oximetry during exercise, safety while exercising, and options of places to exercise outside of rehab.  Flowsheet Row PULMONARY REHAB OTHER RESPIRATORY from 04/25/2022 in [facility] for Heart, Vascular, & Lung Health  Date 04/25/22  [EP]  Educator EP  Instruction Review Code 1- Verbalizes Understanding  [handout only]          MET Level  Group instruction provided by PowerPoint, verbal discussion, and written material to support subject matter. Instructor reviews what METs are and how to increase METs.    Pulmonary Medications Verbally interactive group education provided by instructor with focus on inhaled medications and proper administration. Flowsheet Row PULMONARY REHAB OTHER RESPIRATORY from 05/09/2022 in [facility] for Heart, Vascular, & [place]  Date 05/09/22  Educator EP  Instruction Review Code 1- Armed forces training and education officer  and Physiology of the Respiratory System Group instruction provided by PowerPoint, verbal discussion, and written material to support subject matter. Instructor reviews respiratory cycle and anatomical components of the respiratory system and their functions. Instructor also reviews differences in obstructive and restrictive respiratory diseases with examples of each.    Oxygen Safety Group instruction provided by PowerPoint, verbal discussion, and written material to support subject matter. There is an overview of "What is Oxygen" and "Why do we need it".  Instructor also reviews how to create a safe environment for oxygen use, the importance of using oxygen as prescribed, and the risks of noncompliance. There is a brief discussion on traveling with oxygen and resources the patient may utilize.   Oxygen Use Group instruction provided by PowerPoint, verbal  discussion, and written material to discuss how supplemental oxygen is prescribed and different types of oxygen supply systems. Resources for more information are provided.    Breathing Techniques Group instruction that is supported by demonstration and informational handouts. Instructor discusses the benefits of pursed lip and diaphragmatic breathing and detailed demonstration on how to perform both.     Risk Factor Reduction Group instruction that is supported by a PowerPoint presentation. Instructor discusses the definition of a risk factor, different risk factors for pulmonary disease, and how the heart and lungs work together.   MD Day A group question and answer session with a medical doctor that allows participants to ask questions that relate to their pulmonary disease state.   Nutrition for the Pulmonary Patient Group instruction provided by PowerPoint slides, verbal discussion, and written materials to support subject matter. The instructor gives an explanation and review of healthy diet recommendations, which includes a discussion on weight management, recommendations for fruit and vegetable consumption, as well as protein, fluid, caffeine, fiber, sodium, sugar, and alcohol. Tips for eating when patients are short of breath are discussed.    Other Education Group or individual verbal, written, or video instructions that support the educational goals of the pulmonary rehab program.    Knowledge Questionnaire Score:  Knowledge Questionnaire Score - 04/15/22 1115       Knowledge Questionnaire Score   Pre Score 11/18             Core Components/Risk Factors/Patient Goals at Admission:  Personal Goals and Risk Factors at Admission - 04/15/22 1104       Core Components/Risk Factors/Patient Goals on Admission    Weight Management Weight Gain;Yes    Intervention Weight Management: Develop a combined nutrition and exercise program designed to reach desired caloric intake,  while maintaining appropriate intake of nutrient and fiber, sodium and fats, and appropriate energy expenditure required for the weight goal.;Weight Management: Provide education and appropriate resources to help participant work on and attain dietary goals.    Expected Outcomes Short Term: Continue to assess and modify interventions until short term weight is achieved;Long Term: Adherence to nutrition and physical activity/exercise program aimed toward attainment of established weight goal;Weight Maintenance: Understanding of the daily nutrition guidelines, which includes 25-35% calories from fat, 7% or less cal from saturated fats, less than '200mg'$  cholesterol, less than 1.5gm of sodium, & 5 or more servings of fruits and vegetables daily;Weight Gain: Understanding of general recommendations for a high calorie, high protein meal plan that promotes weight gain by distributing calorie intake throughout the day with the consumption for 4-5 meals, snacks, and/or supplements;Understanding of distribution of calorie intake throughout the day with the consumption of 4-5 meals/snacks;Understanding recommendations for meals to include  15-35% energy as protein, 25-35% energy from fat, 35-60% energy from carbohydrates, less than '200mg'$  of dietary cholesterol, 20-35 gm of total fiber daily    Improve shortness of breath with ADL's Yes    Intervention Provide education, individualized exercise plan and daily activity instruction to help decrease symptoms of SOB with activities of daily living.    Expected Outcomes Short Term: Improve cardiorespiratory fitness to achieve a reduction of symptoms when performing ADLs;Long Term: Be able to perform more ADLs without symptoms or delay the onset of symptoms             Core Components/Risk Factors/Patient Goals Review:   Goals and Risk Factor Review     Row Name 04/17/22 1430 05/15/22 1105           Core Components/Risk Factors/Patient Goals Review   Personal Goals  Review Other;Improve shortness of breath with ADL's;Develop more efficient breathing techniques such as purse lipped breathing and diaphragmatic breathing and practicing self-pacing with activity.;Increase knowledge of respiratory medications and ability to use respiratory devices properly.  Weight gain Other;Improve shortness of breath with ADL's;Develop more efficient breathing techniques such as purse lipped breathing and diaphragmatic breathing and practicing self-pacing with activity.;Increase knowledge of respiratory medications and ability to use respiratory devices properly.  Weight gain      Review [place] has completed PR orientation and is scheduled to start exercising 04/23/22. We will continue to  monitor his core components and address them. Jeff Willis has completed 6 PR classes. He is currently exercising on the NuStep and the Recumbent Bike.   He needs assistance from transferring from 1 piece of equipment to another because of his deconditioning, unstable gait and trying to maneuver his oxygen tank. Staff also assist him in setting up his exercise equipment. Jeff Willis is making progress and has increased his workload. He has began using pursued lip breathing when short of breath but needs reminders to use it on exertion. Jeff Willis has declined education classes due to his family working and driving him to/from class. We have provided hand outs to Jeff Willis about exercise with the resistance bands, warming up, and cooling down at home.      Expected Outcomes See admission goals. See admission goals.               Core Components/Risk Factors/Patient Goals at Discharge (Final Review):   Goals and Risk Factor Review - 05/15/22 1105       Core Components/Risk Factors/Patient Goals Review   Personal Goals Review Other;Improve shortness of breath with ADL's;Develop more efficient breathing techniques such as purse lipped breathing and diaphragmatic breathing and practicing self-pacing with  activity.;Increase knowledge of respiratory medications and ability to use respiratory devices properly.   Weight gain   Review Jeff Willis has completed 6 PR classes. He is currently exercising on the NuStep and the Recumbent Bike.   He needs assistance from transferring from 1 piece of equipment to another because of his deconditioning, unstable gait and trying to maneuver his oxygen tank. Staff also assist him in setting up his exercise equipment. Jeff Willis is making progress and has increased his workload. He has began using pursued lip breathing when short of breath but needs reminders to use it on exertion. Jeff Willis has declined education classes due to his family working and driving him to/from class. We have provided hand outs to Jeff Willis about exercise with the resistance bands, warming up, and cooling down at home.    Expected Outcomes See admission goals.  ITP Comments:   Comments: Pt is making expected progress toward Pulmonary Rehab goals after completing 6 sessions. Recommend continued exercise, life style modification, education, and utilization of breathing techniques to increase stamina and strength, while also decreasing shortness of breath with exertion.  Dr. Rodman Pickle is Medical Director for Pulmonary Rehab at [facility].

## 2022-05-16 ENCOUNTER — Encounter (HOSPITAL_COMMUNITY)
Admission: RE | Admit: 2022-05-16 | Discharge: 2022-05-16 | Disposition: A | Discharge status: Home / Self Care | Payer: Medicare Other | Source: Ambulatory Visit | Attending: Internal Medicine | Admitting: Internal Medicine

## 2022-05-16 DIAGNOSIS — J849 Interstitial pulmonary disease, unspecified: Secondary | ICD-10-CM | POA: Diagnosis not present

## 2022-05-16 NOTE — Progress Notes (Signed)
Daily Session Note  Patient Details  Name: Jeff Willis MRN: [id number] Date of Birth: [birth year] Referring Provider:   April Manson Pulmonary Rehab Walk Test from 04/15/2022 in [facility] for Heart, Vascular, & Lung Health  Referring Provider [name]       Encounter Date: 05/16/2022  Check In:  Session Check In - 05/16/22 1526       Check-In   Supervising physician immediately available to respond to emergencies [facility] MD immediately available    Physician(s) Shearon Stalls    Location [facility]    Staff Present Josephina Shih BS, ACSM-CEP, Exercise Physiologist;Kaylee Rosana Hoes, MS, ACSM-CEP, Exercise Physiologist;Mary Margette Fast, RN, BSN;Other    Virtual Visit No    Medication changes reported     No    Fall or balance concerns reported    No    Tobacco Cessation No Change    Warm-up and Cool-down Performed as group-led instruction    Resistance Training Performed Yes    VAD Patient? No    PAD/SET Patient? No      Pain Assessment   Currently in Pain? No/denies    Multiple Pain Sites No             Capillary Blood Glucose: No results found for this or any previous visit (from the past 24 hour(s)).    Social History   Tobacco Use  Smoking Status Never  Smokeless Tobacco Never    Goals Met:  Independence with exercise equipment Improved SOB with ADL's Exercise tolerated well No report of concerns or symptoms today Strength training completed today  Goals Unmet:  Not Applicable  Comments: Service time is from 1320 to 1500.    Dr. Rodman Pickle is Medical Director for Pulmonary Rehab at [facility].

## 2022-05-17 NOTE — Progress Notes (Signed)
Please let patient know HRCT showed findings consistent with know dx IPF, mild progression of lung disease. Continue Esbriet. Please order PFTs with [facility]CO prior to follow-up with MR in 1-2 months.   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  Please refer to cardiothoracic surgery d/t aortic aneurysm

## 2022-05-21 ENCOUNTER — Encounter (HOSPITAL_COMMUNITY)
Admission: RE | Admit: 2022-05-21 | Discharge: 2022-05-21 | Disposition: A | Discharge status: Home / Self Care | Payer: Medicare Other | Source: Ambulatory Visit | Attending: Internal Medicine | Admitting: Internal Medicine

## 2022-05-21 DIAGNOSIS — J849 Interstitial pulmonary disease, unspecified: Secondary | ICD-10-CM

## 2022-05-21 NOTE — Progress Notes (Signed)
Home Exercise Prescription I have reviewed a Home Exercise Prescription with [place]. He is currently using an ergometer 11 min x2 most days. He also walks for 7-8 min at a time. Encouraged pt to add time gradually to the ergometer to build up to 30 min total. He agreed. We also have been working on exercising without O2. The patient stated that their goals were to build up his strength to be able to drive independently again. We reviewed exercise guidelines, target heart rate during exercise, RPE Scale, weather conditions, endpoints for exercise, warmup and cool down. The patient is encouraged to come to me with any questions. I will continue to follow up with the patient to assist them with progression and safety.    Jery Hollern [place], [place], ACSM-CEP 05/21/2022 3:24 PM

## 2022-05-21 NOTE — Progress Notes (Signed)
Daily Session Note  Patient Details  Name: Jeff Willis MRN: [id number] Date of Birth: [birth year] Referring Provider:   April Manson Pulmonary Rehab Walk Test from 04/15/2022 in [facility] for Heart, Vascular, & [place]  Referring Provider [name]       Encounter Date: 05/21/2022  Check In:  Session Check In - 05/21/22 1429       Check-In   Supervising physician immediately available to respond to emergencies [facility] MD immediately available    Physician(s) Anselm Lis, NP    Location [facility]    Staff Present Josephina Shih BS, ACSM-CEP, Exercise Physiologist;Kaylee Rosana Hoes, MS, ACSM-CEP, Exercise Physiologist;Mary Margette Fast, RN, BSN;Other;Samantha [place], RD, LDN    Virtual Visit No    Medication changes reported     No    Fall or balance concerns reported    No    Tobacco Cessation No Change    Warm-up and Cool-down Performed as group-led instruction    Resistance Training Performed Yes    VAD Patient? No    PAD/SET Patient? No      Pain Assessment   Currently in Pain? No/denies    Multiple Pain Sites No             Capillary Blood Glucose: No results found for this or any previous visit (from the past 24 hour(s)).   Exercise Prescription Changes - 05/21/22 1500       Home Exercise Plan   Plans to continue exercise at Home (comment)   ergometer at home   Frequency --   add time   Initial Home Exercises Provided 05/21/22             Social History   Tobacco Use  Smoking Status Never  Smokeless Tobacco Never    Goals Met:  Independence with exercise equipment Exercise tolerated well No report of concerns or symptoms today Strength training completed today  Goals Unmet:  Not Applicable  Comments: Service time is from 1320 to 1445.    Dr. Rodman Pickle is Medical Director for Pulmonary Rehab at [facility].

## 2022-05-23 ENCOUNTER — Encounter (HOSPITAL_COMMUNITY)
Admission: RE | Admit: 2022-05-23 | Discharge: 2022-05-23 | Disposition: A | Discharge status: Home / Self Care | Payer: Medicare Other | Source: Ambulatory Visit | Attending: Internal Medicine | Admitting: Internal Medicine

## 2022-05-23 DIAGNOSIS — J849 Interstitial pulmonary disease, unspecified: Secondary | ICD-10-CM | POA: Diagnosis not present

## 2022-05-23 NOTE — Progress Notes (Signed)
Daily Session Note  Patient Details  Name: Jeff Willis MRN: [id number] Date of Birth: [birth year] Referring Provider:   April Manson Pulmonary Rehab Walk Test from 04/15/2022 in [facility] for [facility], Vascular, & [facility]  Referring Provider [name]       Encounter Date: 05/23/2022  Check In:  Session Check In - 05/23/22 1325       Check-In   Supervising physician immediately available to respond to emergencies [facility] MD immediately available    Physician(s) Dr. Marshell Garfinkel    [facility]    Staff Present Janine Ores, RN, Quentin Ore, MS, ACSM-CEP, Exercise Physiologist;Randi Yevonne Pax, ACSM-CEP, Exercise Physiologist;Other    Virtual Visit No    Medication changes reported     No    Comments --    Fall or balance concerns reported    No    Tobacco Cessation No Change    Warm-up and Cool-down Performed as group-led instruction    Resistance Training Performed Yes    VAD Patient? No    PAD/SET Patient? No      Pain Assessment   Currently in Pain? No/denies    Multiple Pain Sites No             Capillary Blood Glucose: No results found for this or any previous visit (from the past 24 hour(s)).    Social History   Tobacco Use  Smoking Status Never  Smokeless Tobacco Never    Goals Met:  Proper associated with RPD/PD & O2 Sat Independence with exercise equipment Exercise tolerated well Strength training completed today  Goals Unmet:  Not Applicable  Comments: Service time is from 1319 to 1445.    Dr. Rodman Pickle is Medical Director for Pulmonary Rehab at [facility].

## 2022-05-23 NOTE — Progress Notes (Signed)
Patient came to Pulmonary Rehab today. Resting heart rate 123, pt asymptomatic, BP 110/60, O2 sat 96% on RA. His target heart rate range is 56-112. Pulse spot checked after warm up and heart rate 136. RN spoke to MD in office, Dr. Marshell Garfinkel and pt was cleared to exercise. Heart rate while exercising max was 138. Post exercise VS were BP 102/60, 93% on 0.5L O2, and HR 131.

## 2022-05-24 ENCOUNTER — Telehealth (HOSPITAL_BASED_OUTPATIENT_CLINIC_OR_DEPARTMENT_OTHER): Payer: Self-pay

## 2022-05-24 ENCOUNTER — Ambulatory Visit: Payer: Medicare Other | Attending: Family

## 2022-05-24 DIAGNOSIS — R000 Tachycardia, unspecified: Secondary | ICD-10-CM

## 2022-05-24 DIAGNOSIS — I2510 Atherosclerotic heart disease of native coronary artery without angina pectoris: Secondary | ICD-10-CM

## 2022-05-24 MED ORDER — METOPROLOL SUCCINATE ER 50 MG PO TB24: 75.0000 mg | ORAL_TABLET | Freq: Every day | ORAL | 3 refills | Status: DC

## 2022-05-24 NOTE — Telephone Encounter (Addendum)
Called patient and spoke with son (ok per DPR) medication updated and zio ordered to be mailed to patient.    ----- Message from Loel Dubonnet, NP sent at 05/23/2022  7:19 PM EST ----- Regarding: RE: Resting HR Hi Jeff Willis,   Thanks for the update! Unless he is symptomatic (heart racing, shortness of breath, lightheaded) I am fine with him continuing to exercise. I anticipate some of his lung disease contributes to his tachycardia. Let me know if you have questions!  From my last office note: "Tachycardia - Notes recurrence of tachycardia with resting heart rate >130. Asymptomatic with no palpitations, lightheadedness, worsening dyspnea. Increase Toprol to '50mg'$  QD. Update CBC, CMP, magnesium, thyroid panel. Consider ZIO monitor if heart rate does not respond to Toprol. Hesitant to utilize CCB due to relative hypotension. "  Daphene Jaeger - can we please call him and ask him to increase Toprol to '75mg'$  QD? Would also recommend 7 day ZIO so we can better assess his heart rate.

## 2022-05-24 NOTE — Progress Notes (Unsigned)
Enrolled patient for a 7 day Zio Xt monitor to be mailed to patients home  Dr Claiborne Billings to read

## 2022-05-28 ENCOUNTER — Encounter (HOSPITAL_COMMUNITY)
Admission: RE | Admit: 2022-05-28 | Discharge: 2022-05-28 | Disposition: A | Discharge status: Home / Self Care | Payer: Medicare Other | Source: Ambulatory Visit | Attending: Internal Medicine | Admitting: Internal Medicine

## 2022-05-28 ENCOUNTER — Encounter (HOSPITAL_COMMUNITY): Payer: Self-pay

## 2022-05-28 VITALS — Wt 112.4 lb

## 2022-05-28 DIAGNOSIS — J849 Interstitial pulmonary disease, unspecified: Secondary | ICD-10-CM

## 2022-05-28 NOTE — Progress Notes (Signed)
Daily Session Note  Patient Details  Name: Jeff Willis MRN: [id number] Date of Birth: September 28, 1940 Referring Provider:   April Manson Pulmonary Rehab Walk Test from 04/15/2022 in [facility] for Heart, Vascular, & Lung [facility]  Referring Provider [name]       Encounter Date: 05/28/2022  Check In:  Session Check In - 05/28/22 1441       Check-In   Supervising physician immediately available to respond to emergencies [facility] MD immediately available    Physician(s) Dr. Marshell Garfinkel    [facility]    Staff Present Janine Ores, RN, Quentin Ore, MS, ACSM-CEP, Exercise Physiologist;Randi Yevonne Pax, ACSM-CEP, Exercise Physiologist;Other    Virtual Visit No    Medication changes reported     No    Fall or balance concerns reported    No    Tobacco Cessation No Change    Warm-up and Cool-down Performed as group-led instruction    Resistance Training Performed Yes    VAD Patient? No    PAD/SET Patient? No      Pain Assessment   Currently in Pain? No/denies    Multiple Pain Sites No             Capillary Blood Glucose: No results found for this or any previous visit (from the past 24 hour(s)).   Exercise Prescription Changes - 05/28/22 1500       Response to Exercise   Blood Pressure (Admit) 124/66    Blood Pressure (Exercise) 132/80    Blood Pressure (Exit) 122/70    Heart Rate (Admit) 79 bpm    Heart Rate (Exercise) 140 bpm    Heart Rate (Exit) 137 bpm    Oxygen Saturation (Admit) 100 %    Oxygen Saturation (Exercise) 91 %    Oxygen Saturation (Exit) 97 %    Rating of Perceived Exertion (Exercise) 11    Perceived Dyspnea (Exercise) 1    Comments high HR, cards aware    Duration Continue with 30 min of aerobic exercise without signs/symptoms of physical distress.    Intensity THRR unchanged      Progression   Progression Continue to progress workloads to maintain intensity without signs/symptoms of  physical distress.      Resistance Training   Training Prescription Yes    Weight Red Bands    Reps 10-15    Time 10 Minutes      Oxygen   Oxygen Continuous    Liters 0.5-1L      NuStep   Level 3    SPM 50    Minutes 15    METs 2.2      Track   Laps 7    Minutes 10    METs 2.62      Oxygen   Maintain Oxygen Saturation 88% or higher             Social History   Tobacco Use  Smoking Status Never  Smokeless Tobacco Never    Goals Met:  Improved SOB with ADL's Exercise tolerated well No report of concerns or symptoms today Strength training completed today  Goals Unmet:  Not Applicable  Comments: Service time is from 1315 to [place]    Dr. Rodman Pickle is Medical Director for Pulmonary Rehab at [facility].

## 2022-05-30 ENCOUNTER — Encounter (HOSPITAL_COMMUNITY)
Admission: RE | Admit: 2022-05-30 | Discharge: 2022-05-30 | Disposition: A | Discharge status: Home / Self Care | Payer: Medicare Other | Source: Ambulatory Visit | Attending: Internal Medicine | Admitting: Internal Medicine

## 2022-05-30 DIAGNOSIS — J849 Interstitial pulmonary disease, unspecified: Secondary | ICD-10-CM

## 2022-05-30 NOTE — Progress Notes (Signed)
Daily Session Note  Patient Details  Name: Jeff Willis MRN: [id number] Date of Birth: January 29, 1941 Referring Provider:   April Manson Pulmonary Rehab Walk Test from 04/15/2022 in [facility] for Heart, Vascular, & [place]  Referring Provider [name]       Encounter Date: 05/30/2022  Check In:  Session Check In - 05/30/22 1322       Check-In   Supervising physician immediately available to respond to emergencies [facility] MD immediately available    Physician(s) Mannam    [facility]    Staff Present Samantha [place], RD, Philip Aspen, MS, ACSM-CEP, Exercise Physiologist;Randi Yevonne Pax, ACSM-CEP, Exercise Physiologist;Other;Mary Margette Fast, RN, BSN    Virtual Visit No    Medication changes reported     Yes    Comments increased toprol to 75 mg, no sx reported    Fall or balance concerns reported    No    Tobacco Cessation No Change    Warm-up and Cool-down Performed as group-led instruction    Resistance Training Performed Yes    VAD Patient? No    PAD/SET Patient? No      Pain Assessment   Currently in Pain? No/denies             Capillary Blood Glucose: No results found for this or any previous visit (from the past 24 hour(s)).    Social History   Tobacco Use  Smoking Status Never  Smokeless Tobacco Never    Goals Met:  Proper associated with RPD/PD & O2 Sat Independence with exercise equipment Exercise tolerated well No report of concerns or symptoms today Strength training completed today  Goals Unmet:  Not Applicable  Comments: Service time is from 1315 to 1440.    Dr. Rodman Pickle is Medical Director for Pulmonary Rehab at [facility].

## 2022-05-31 DIAGNOSIS — R000 Tachycardia, unspecified: Secondary | ICD-10-CM | POA: Diagnosis not present

## 2022-05-31 DIAGNOSIS — I2510 Atherosclerotic heart disease of native coronary artery without angina pectoris: Secondary | ICD-10-CM

## 2022-06-04 ENCOUNTER — Encounter (HOSPITAL_COMMUNITY)
Admission: RE | Admit: 2022-06-04 | Discharge: 2022-06-04 | Disposition: A | Discharge status: Home / Self Care | Payer: Medicare Other | Source: Ambulatory Visit | Attending: Internal Medicine | Admitting: Internal Medicine

## 2022-06-04 DIAGNOSIS — J849 Interstitial pulmonary disease, unspecified: Secondary | ICD-10-CM

## 2022-06-04 NOTE — Progress Notes (Signed)
Daily Session Note  Patient Details  Name: Jeff Willis MRN: [id number] Date of Birth: 06/14/1940 Referring Provider:   April Manson Pulmonary Rehab Walk Test from 04/15/2022 in [facility] for Heart, Vascular, & Lung Health  Referring Provider [name]       Encounter Date: 06/04/2022  Check In:  Session Check In - 06/04/22 1431       Check-In   Supervising physician immediately available to respond to emergencies [facility] MD immediately available    Physician(s) Clung    [facility]    Staff Present Samantha [place], RD, Philip Aspen, MS, ACSM-CEP, Exercise Physiologist;Randi Yevonne Pax, ACSM-CEP, Exercise Physiologist;Other;Mary Margette Fast, RN, BSN    Virtual Visit No    Medication changes reported     No    Fall or balance concerns reported    No    Tobacco Cessation No Change    Warm-up and Cool-down Performed as group-led instruction    Resistance Training Performed Yes    VAD Patient? No    PAD/SET Patient? No      Pain Assessment   Currently in Pain? No/denies    Multiple Pain Sites No             Capillary Blood Glucose: No results found for this or any previous visit (from the past 24 hour(s)).    Social History   Tobacco Use  Smoking Status Never  Smokeless Tobacco Never    Goals Met:  Proper associated with RPD/PD & O2 Sat Exercise tolerated well No report of concerns or symptoms today Strength training completed today  Goals Unmet:  Not Applicable  Comments: Service time is from 1324 to 1445.    Dr. Rodman Pickle is Medical Director for Pulmonary Rehab at [facility].

## 2022-06-06 ENCOUNTER — Encounter (HOSPITAL_COMMUNITY)
Admission: RE | Admit: 2022-06-06 | Discharge: 2022-06-06 | Disposition: A | Discharge status: Home / Self Care | Payer: Medicare Other | Source: Ambulatory Visit | Attending: Internal Medicine | Admitting: Internal Medicine

## 2022-06-06 ENCOUNTER — Ambulatory Visit: Payer: Medicare Other | Admitting: Internal Medicine

## 2022-06-06 DIAGNOSIS — J849 Interstitial pulmonary disease, unspecified: Secondary | ICD-10-CM | POA: Diagnosis not present

## 2022-06-06 NOTE — Progress Notes (Signed)
Daily Session Note  Patient Details  Name: Jeff Willis MRN: [id number] Date of Birth: 08-10-1940 Referring Provider:   April Manson Pulmonary Rehab Walk Test from 04/15/2022 in [facility] for Heart, Vascular, & Lung Health  Referring Provider [name]       Encounter Date: 06/06/2022  Check In:  Session Check In - 06/06/22 1454       Check-In   Supervising physician immediately available to respond to emergencies [facility] MD immediately available    Physician(s) Dr Vaughan Browner    Location [facility]    Staff Present Janine Ores, RN, Quentin Ore, MS, ACSM-CEP, Exercise Physiologist;Samantha [place], RD, LDN;Jetta Walker BS, ACSM-CEP, Exercise Physiologist;Other    Virtual Visit No    Medication changes reported     No    Fall or balance concerns reported    No    Tobacco Cessation No Change    Warm-up and Cool-down Performed as group-led instruction    Resistance Training Performed Yes    VAD Patient? No    PAD/SET Patient? No      Pain Assessment   Currently in Pain? No/denies    Multiple Pain Sites No             Capillary Blood Glucose: No results found for this or any previous visit (from the past 24 hour(s)).    Social History   Tobacco Use  Smoking Status Never  Smokeless Tobacco Never    Goals Met:  Proper associated with RPD/PD & O2 Sat Independence with exercise equipment Exercise tolerated well No report of concerns or symptoms today Strength training completed today  Goals Unmet:  Not Applicable  Comments: Service time is from 1317 to 1440.    Dr. Rodman Pickle is Medical Director for Pulmonary Rehab at [facility].

## 2022-06-11 ENCOUNTER — Encounter (HOSPITAL_COMMUNITY)
Admission: RE | Admit: 2022-06-11 | Discharge: 2022-06-11 | Disposition: A | Discharge status: Home / Self Care | Payer: Medicare Other | Source: Ambulatory Visit | Attending: Internal Medicine | Admitting: Internal Medicine

## 2022-06-11 VITALS — Wt 110.0 lb

## 2022-06-11 DIAGNOSIS — J849 Interstitial pulmonary disease, unspecified: Secondary | ICD-10-CM | POA: Diagnosis not present

## 2022-06-11 NOTE — Progress Notes (Signed)
Daily Session Note  Patient Details  Name: Jeff Willis MRN: [id number] Date of Birth: 08/07/1940 Referring Provider:   April Manson Pulmonary Rehab Walk Test from 04/15/2022 in [facility] for Heart, Vascular, & [place]  Referring Provider [name]       Encounter Date: 06/11/2022  Check In:  Session Check In - 06/11/22 1545       Check-In   Supervising physician immediately available to respond to emergencies [facility] MD immediately available    Physician(s) Sharolyn Douglas    [facility]    Staff Present Janine Ores, RN, Quentin Ore, MS, ACSM-CEP, Exercise Physiologist;Carlette Wilber Oliphant, RN, BSN;Randi Reeve BS, ACSM-CEP, Exercise Physiologist    Virtual Visit No    Medication changes reported     No    Fall or balance concerns reported    No    Tobacco Cessation No Change    Warm-up and Cool-down Performed as group-led instruction    Resistance Training Performed Yes    VAD Patient? No    PAD/SET Patient? No      Pain Assessment   Currently in Pain? No/denies    Multiple Pain Sites No             Capillary Blood Glucose: No results found for this or any previous visit (from the past 24 hour(s)).   Exercise Prescription Changes - 06/11/22 1500       Response to Exercise   Blood Pressure (Admit) 110/80    Blood Pressure (Exercise) 110/60    Blood Pressure (Exit) 100/58    Heart Rate (Admit) 119 bpm    Heart Rate (Exercise) 131 bpm    Heart Rate (Exit) 125 bpm    Oxygen Saturation (Admit) 97 %    Oxygen Saturation (Exercise) 92 %    Oxygen Saturation (Exit) 95 %    Rating of Perceived Exertion (Exercise) 11    Perceived Dyspnea (Exercise) 1    Duration Continue with 30 min of aerobic exercise without signs/symptoms of physical distress.    Intensity THRR unchanged      Progression   Progression Continue to progress workloads to maintain intensity without signs/symptoms of physical distress.       Resistance Training   Training Prescription Yes    Weight Red Bands    Reps 10-15    Time 10 Minutes      Oxygen   Oxygen --   D/C     NuStep   Level 3    SPM 60    Minutes 15    METs 2.3      Track   Laps 7    Minutes 15    METs 2.08      Oxygen   Maintain Oxygen Saturation 88% or higher             Social History   Tobacco Use  Smoking Status Never  Smokeless Tobacco Never    Goals Met:  Proper associated with RPD/PD & O2 Sat Independence with exercise equipment Exercise tolerated well No report of concerns or symptoms today Strength training completed today  Goals Unmet:  Not Applicable  Comments: Service time is from 1321 to 1450.    Dr. Rodman Pickle is Medical Director for Pulmonary Rehab at [facility]

## 2022-06-12 NOTE — Progress Notes (Signed)
Pulmonary Individual Treatment Plan  Patient Details  Name: Jeff Willis MRN: [id number] Date of Birth: [birth year] Referring Provider:   April Manson Pulmonary Rehab Walk Test from 04/15/2022 in [facility] for Heart, Vascular, & [place]  Referring Provider [name]       Initial Encounter Date:  Flowsheet Row Pulmonary Rehab Walk Test from 04/15/2022 in [facility] for Heart, Vascular, & [employer]  Date 04/15/22       Visit Diagnosis: ILD (interstitial lung disease) ([place])  Patient's Home Medications on Admission:   Current Outpatient Medications:    aspirin EC 81 MG tablet, Take 81 mg by mouth daily. Swallow whole., Disp: , Rfl:    chlorpheniramine-HYDROcodone (TUSSIONEX) 10-8 MG/5ML, Take 5 mLs by mouth every 12 (twelve) hours as needed for cough., Disp: 300 mL, Rfl: 0   furosemide (LASIX) 20 MG tablet, Take 1 tablet (20 mg total) by mouth daily as needed for edema., Disp: 30 tablet, Rfl: 1   melatonin 3 MG TABS tablet, Take 1 tablet (3 mg total) by mouth at bedtime as needed., Disp: , Rfl: 0   metoprolol succinate (TOPROL-XL) 50 MG 24 hr tablet, Take 1.5 tablets (75 mg total) by mouth daily. Take with or immediately following a meal., Disp: 135 tablet, Rfl: 3   Multiple Vitamin (MULTIVITAMIN WITH MINERALS) TABS tablet, Take 1 tablet by mouth daily., Disp: , Rfl:    pantoprazole (PROTONIX) 40 MG tablet, Take 1 tablet (40 mg total) by mouth 2 (two) times daily., Disp: 30 tablet, Rfl: 2   Pirfenidone 267 MG CAPS, Take 534 mg by mouth in the morning, at noon, and at bedtime., Disp: , Rfl:    rosuvastatin (CRESTOR) 10 MG tablet, TAKE ONE TABLET BY MOUTH DAILY, Disp: 90 tablet, Rfl: 3   Vitamin D, Ergocalciferol, (DRISDOL) 1.25 MG (50000 UNIT) CAPS capsule, Take 1 capsule (50,000 Units total) by mouth every 7 (seven) days., Disp: 5 capsule, Rfl: 0  Past Medical History: Past Medical History:  Diagnosis Date   Back pain     Elevated PSA    Essential tremor    Hypercholesteremia    Neuropathy    Prostate cancer ([place])    Sinus tachycardia 01/30/2018    Tobacco Use: Social History   Tobacco Use  Smoking Status Never  Smokeless Tobacco Never    Labs: Review Flowsheet  More data exists      Latest Ref Rng & Units 01/02/2022 01/03/2022 01/04/2022 01/07/2022 05/10/2022  Labs for ITP Cardiac and Pulmonary Rehab  Direct LDL 0 - 99 mg/dL - - - - 70   Trlycerides <150 mg/dL - - 36  70  -  PH, Arterial 7.35 - 7.45 7.366  7.362  7.428  - - -  PCO2 arterial 32 - 48 mmHg 52.8  55.6  49.0  - - -  Bicarbonate 20.0 - 28.0 mmol/L 30.5  31.7  32.5  - - -  TCO2 22 - 32 mmol/L 32  33  34  - - -  O2 Saturation % 99  91  99  - - -    Capillary Blood Glucose: Lab Results  Component Value Date   GLUCAP 115 (H) 03/12/2022   GLUCAP 76 01/28/2022   GLUCAP 174 (H) 01/27/2022   GLUCAP 151 (H) 01/27/2022   GLUCAP 115 (H) 01/27/2022     Pulmonary Assessment Scores:  Pulmonary Assessment Scores     Row Name 04/15/22 1111 06/07/22 1444  ADL UCSD   ADL Phase Entry Entry    SOB Score total 71 42      CAT Score   CAT Score 11 23      mMRC Score   mMRC Score 0 0            UCSD: Self-administered rating of dyspnea associated with activities of daily living (ADLs) 6-point scale (0 = "not at all" to 5 = "maximal or unable to do because of breathlessness")  Scoring Scores range from 0 to 120.  Minimally important difference is 5 units  CAT: CAT can identify the health impairment of COPD patients and is better correlated with disease progression.  CAT has a scoring range of zero to 40. The CAT score is classified into four groups of low (less than 10), medium (10 - 20), high (21-30) and very high (31-40) based on the impact level of disease on health status. A CAT score over 10 suggests significant symptoms.  A worsening CAT score could be explained by an exacerbation, poor medication adherence, poor inhaler  technique, or progression of COPD or comorbid conditions.  CAT MCID is 2 points  mMRC: mMRC (Modified Medical Research Council) Dyspnea Scale is used to assess the degree of baseline functional disability in patients of respiratory disease due to dyspnea. No minimal important difference is established. A decrease in score of 1 point or greater is considered a positive change.   Pulmonary Function Assessment:  Pulmonary Function Assessment - 04/15/22 1110       Breath   Bilateral Breath Sounds Clear;Decreased    Shortness of Breath No             Exercise Target Goals: Exercise Program Goal: Individual exercise prescription set using results from initial 6 min walk test and THRR while considering  patient's activity barriers and safety.   Exercise Prescription Goal: Initial exercise prescription builds to 30-45 minutes a day of aerobic activity, 2-3 days per week.  Home exercise guidelines will be given to patient during program as part of exercise prescription that the participant will acknowledge.  Activity Barriers & Risk Stratification:  Activity Barriers & Cardiac Risk Stratification - 04/15/22 1111       Activity Barriers & Cardiac Risk Stratification   Activity Barriers Back Problems;Shortness of Breath;Muscular Weakness;Deconditioning             6 Minute Walk:  6 Minute Walk     Row Name 04/15/22 1215         6 Minute Walk   Phase Initial     Distance 535 feet     Walk Time 6 minutes     # of Rest Breaks 0     MPH 1.01     METS 1.44     RPE 9     Perceived Dyspnea  0     VO2 Peak 5.04     Symptoms No     Resting HR 76 bpm     Resting BP 124/62     Resting Oxygen Saturation  97 %     Exercise Oxygen Saturation  during 6 min walk 90 %     Max Ex. HR 104 bpm     Max Ex. BP 122/64     2 Minute Post BP 118/60       Interval HR   1 Minute HR 89     2 Minute HR 92     3 Minute HR 94  4 Minute HR 94     5 Minute HR 104     6 Minute HR 96      2 Minute Post HR 81     Interval Heart Rate? Yes       Interval Oxygen   Interval Oxygen? Yes     Baseline Oxygen Saturation % 97 %  On 1L. Decreased to no O2     1 Minute Oxygen Saturation % 93 %     1 Minute Liters of Oxygen 0 L     2 Minute Oxygen Saturation % 92 %     2 Minute Liters of Oxygen 0 L     3 Minute Oxygen Saturation % 91 %     3 Minute Liters of Oxygen 0 L     4 Minute Oxygen Saturation % 90 %     4 Minute Liters of Oxygen 0 L     5 Minute Oxygen Saturation % 90 %     5 Minute Liters of Oxygen 0 L     6 Minute Oxygen Saturation % 90 %     6 Minute Liters of Oxygen 0 L     2 Minute Post Oxygen Saturation % 95 %     2 Minute Post Liters of Oxygen 0 L              Oxygen Initial Assessment:  Oxygen Initial Assessment - 04/15/22 1108       Home Oxygen   Home Oxygen Device Portable Concentrator;Home Concentrator    Sleep Oxygen Prescription Continuous    Liters per minute 1    Home Exercise Oxygen Prescription Pulsed    Liters per minute 2    Home Resting Oxygen Prescription Pulsed    Liters per minute 1    Compliance with Home Oxygen Use Yes      Initial 6 min Walk   Oxygen Used Continuous    Liters per minute 1      Program Oxygen Prescription   Program Oxygen Prescription Continuous    Liters per minute 1      Intervention   Short Term Goals To learn and exhibit compliance with exercise, home and travel O2 prescription;To learn and understand importance of maintaining oxygen saturations>88%;To learn and demonstrate proper use of respiratory medications;To learn and demonstrate proper pursed lip breathing techniques or other breathing techniques. ;To learn and understand importance of monitoring SPO2 with pulse oximeter and demonstrate accurate use of the pulse oximeter.    Long  Term Goals Exhibits compliance with exercise, home  and travel O2 prescription;Verbalizes importance of monitoring SPO2 with pulse oximeter and return  demonstration;Maintenance of O2 saturations>88%;Exhibits proper breathing techniques, such as pursed lip breathing or other method taught during program session;Compliance with respiratory medication             Oxygen Re-Evaluation:  Oxygen Re-Evaluation     Row Name 04/16/22 0935 05/10/22 1022 06/03/22 1156         Program Oxygen Prescription   Program Oxygen Prescription Continuous Continuous Continuous     Liters per minute 1 1 1       $ Home Oxygen   Home Oxygen Device Portable Concentrator;Home Concentrator Portable Concentrator;Home Concentrator Portable Concentrator;Home Concentrator     Sleep Oxygen Prescription Continuous Continuous Continuous     Liters per minute 1 1 1     $ Home Exercise Oxygen Prescription Pulsed Pulsed Pulsed     Liters per minute 2 2 2  Home Resting Oxygen Prescription Pulsed Pulsed Pulsed     Liters per minute 1 1 1     $ Compliance with Home Oxygen Use Yes Yes Yes       Goals/Expected Outcomes   Short Term Goals To learn and exhibit compliance with exercise, home and travel O2 prescription;To learn and understand importance of maintaining oxygen saturations>88%;To learn and demonstrate proper use of respiratory medications;To learn and demonstrate proper pursed lip breathing techniques or other breathing techniques. ;To learn and understand importance of monitoring SPO2 with pulse oximeter and demonstrate accurate use of the pulse oximeter. To learn and exhibit compliance with exercise, home and travel O2 prescription;To learn and understand importance of maintaining oxygen saturations>88%;To learn and demonstrate proper use of respiratory medications;To learn and demonstrate proper pursed lip breathing techniques or other breathing techniques. ;To learn and understand importance of monitoring SPO2 with pulse oximeter and demonstrate accurate use of the pulse oximeter. To learn and exhibit compliance with exercise, home and travel O2 prescription;To learn  and understand importance of maintaining oxygen saturations>88%;To learn and demonstrate proper use of respiratory medications;To learn and demonstrate proper pursed lip breathing techniques or other breathing techniques. ;To learn and understand importance of monitoring SPO2 with pulse oximeter and demonstrate accurate use of the pulse oximeter.     Long  Term Goals Exhibits compliance with exercise, home  and travel O2 prescription;Verbalizes importance of monitoring SPO2 with pulse oximeter and return demonstration;Maintenance of O2 saturations>88%;Exhibits proper breathing techniques, such as pursed lip breathing or other method taught during program session;Compliance with respiratory medication Exhibits compliance with exercise, home  and travel O2 prescription;Verbalizes importance of monitoring SPO2 with pulse oximeter and return demonstration;Maintenance of O2 saturations>88%;Exhibits proper breathing techniques, such as pursed lip breathing or other method taught during program session;Compliance with respiratory medication Exhibits compliance with exercise, home  and travel O2 prescription;Verbalizes importance of monitoring SPO2 with pulse oximeter and return demonstration;Maintenance of O2 saturations>88%;Exhibits proper breathing techniques, such as pursed lip breathing or other method taught during program session;Compliance with respiratory medication     Comments -- no change no change     Goals/Expected Outcomes Compliance and understanding of oxygen saturation monitoring and breathing techniques to decrease shortness of breath. Compliance and understanding of oxygen saturation monitoring and breathing techniques to decrease shortness of breath. Compliance and understanding of oxygen saturation monitoring and breathing techniques to decrease shortness of breath.              Oxygen Discharge (Final Oxygen Re-Evaluation):  Oxygen Re-Evaluation - 06/03/22 1156       Program Oxygen  Prescription   Program Oxygen Prescription Continuous    Liters per minute 1      Home Oxygen   Home Oxygen Device Portable Concentrator;Home Concentrator    Sleep Oxygen Prescription Continuous    Liters per minute 1    Home Exercise Oxygen Prescription Pulsed    Liters per minute 2    Home Resting Oxygen Prescription Pulsed    Liters per minute 1    Compliance with Home Oxygen Use Yes      Goals/Expected Outcomes   Short Term Goals To learn and exhibit compliance with exercise, home and travel O2 prescription;To learn and understand importance of maintaining oxygen saturations>88%;To learn and demonstrate proper use of respiratory medications;To learn and demonstrate proper pursed lip breathing techniques or other breathing techniques. ;To learn and understand importance of monitoring SPO2 with pulse oximeter and demonstrate accurate use of the pulse oximeter.  Long  Term Goals Exhibits compliance with exercise, home  and travel O2 prescription;Verbalizes importance of monitoring SPO2 with pulse oximeter and return demonstration;Maintenance of O2 saturations>88%;Exhibits proper breathing techniques, such as pursed lip breathing or other method taught during program session;Compliance with respiratory medication    Comments no change    Goals/Expected Outcomes Compliance and understanding of oxygen saturation monitoring and breathing techniques to decrease shortness of breath.             Initial Exercise Prescription:  Initial Exercise Prescription - 04/15/22 1200       Date of Initial Exercise RX and Referring Provider   Date 04/15/22    Referring Provider [name]    Expected Discharge Date 06/20/22      Oxygen   Oxygen Continuous    Liters 1    Maintain Oxygen Saturation 88% or higher      NuStep   Level 1    SPM 50    Minutes 15      Track   Minutes 15    METs 1.44      Prescription Details   Frequency (times per week) 2    Duration Progress to 30 minutes of  continuous aerobic without signs/symptoms of physical distress      Intensity   THRR 40-80% of Max Heartrate 56-112    Ratings of Perceived Exertion 11-13    Perceived Dyspnea 0-4      Progression   Progression Continue to progress workloads to maintain intensity without signs/symptoms of physical distress.      Resistance Training   Training Prescription Yes    Weight red bands    Reps 10-15             Perform Capillary Blood Glucose checks as needed.  Exercise Prescription Changes:   Exercise Prescription Changes     Row Name 04/30/22 1500 05/14/22 1500 05/21/22 1500 05/28/22 1500 06/11/22 1500     Response to Exercise   Blood Pressure (Admit) 118/65 110/60 -- 124/66 110/80   Blood Pressure (Exercise) 104/60 108/60 -- 132/80 110/60   Blood Pressure (Exit) 108/68 98/62 -- 122/70 100/58   Heart Rate (Admit) 73 bpm 75 bpm -- 79 bpm 119 bpm   Heart Rate (Exercise) 94 bpm 130 bpm -- 140 bpm 131 bpm   Heart Rate (Exit) 87 bpm 127 bpm -- 137 bpm 125 bpm   Oxygen Saturation (Admit) 98 % 98 % -- 100 % 97 %   Oxygen Saturation (Exercise) 91 % 91 % -- 91 % 92 %   Oxygen Saturation (Exit) 96 % 97 % -- 97 % 95 %   Rating of Perceived Exertion (Exercise) 9 9 -- 11 11   Perceived Dyspnea (Exercise) 2.5 1 -- 1 1   Comments -- --  high HR cardiology aware -- high HR, cards aware --   Duration Continue with 30 min of aerobic exercise without signs/symptoms of physical distress. Continue with 30 min of aerobic exercise without signs/symptoms of physical distress. -- Continue with 30 min of aerobic exercise without signs/symptoms of physical distress. Continue with 30 min of aerobic exercise without signs/symptoms of physical distress.   Intensity THRR unchanged THRR unchanged -- THRR unchanged THRR unchanged     Progression   Progression Continue to progress workloads to maintain intensity without signs/symptoms of physical distress. Continue to progress workloads to maintain intensity  without signs/symptoms of physical distress. -- Continue to progress workloads to maintain intensity without signs/symptoms of physical distress. Continue  to progress workloads to maintain intensity without signs/symptoms of physical distress.     Resistance Training   Training Prescription Yes Yes -- Yes Yes   Weight Red Bands Red Bands -- Red Bands Red Bands   Reps 10-15 10-15 -- 10-15 10-15   Time -- 10 Minutes -- 10 Minutes 10 Minutes     Oxygen   Oxygen Continuous Continuous -- Continuous --  D/C   Liters 1 1 -- 0.5-1L --     NuStep   Level 1 2 -- 3 3   SPM 50 50 -- 50 60   Minutes 15 15 -- 15 15   METs 1.5 --  did not get METs -- 2.2 2.3     Track   Laps 8 4 -- 7 7   Minutes 15 10 -- 10 15   METs 2.23 1.92 -- 2.62 2.08     Home Exercise Plan   Plans to continue exercise at -- -- Home (comment)  ergometer at home -- --   Frequency -- -- --  add time -- --   Initial Home Exercises Provided -- -- 05/21/22 -- --     Oxygen   Maintain Oxygen Saturation 88% or higher -- -- 88% or higher 88% or higher            Exercise Comments:   Exercise Comments     Row Name 04/23/22 1601 05/21/22 1519         Exercise Comments Pt completed first day of exercise. He is weak and needs a cane or RW walking plus assistance carrying O2 carrier. He exercised on recumbent stepper for 15 min, level 1, METs 1.0. Then he walked the track pushing the wheelchair, 5 laps, METs 1.77. Pt surprised himself with walking, tolerated fairly well, albeit slow pace. He performed warm up and cool down standing with demonstrative and verbal cues. He performed squats with assist. Discussed METs with good reception. Will progress as tolerated. Discussed with pt home exercise plan. He is currently using an ergometer 11 min x2 most days. He also walks for 7-8 min at a time. Encouraged pt to add time gradually to the ergometer to build up to 30 min total. He agreed. We also have been working on exercising  without O2.               Exercise Goals and Review:   Exercise Goals     Row Name 04/15/22 1117 04/16/22 0934 05/10/22 1017 06/03/22 1137       Exercise Goals   Increase Physical Activity Yes Yes Yes Yes    Intervention Provide advice, education, support and counseling about physical activity/exercise needs.;Develop an individualized exercise prescription for aerobic and resistive training based on initial evaluation findings, risk stratification, comorbidities and participant's personal goals. Provide advice, education, support and counseling about physical activity/exercise needs.;Develop an individualized exercise prescription for aerobic and resistive training based on initial evaluation findings, risk stratification, comorbidities and participant's personal goals. Provide advice, education, support and counseling about physical activity/exercise needs.;Develop an individualized exercise prescription for aerobic and resistive training based on initial evaluation findings, risk stratification, comorbidities and participant's personal goals. Provide advice, education, support and counseling about physical activity/exercise needs.;Develop an individualized exercise prescription for aerobic and resistive training based on initial evaluation findings, risk stratification, comorbidities and participant's personal goals.    Expected Outcomes Short Term: Attend rehab on a regular basis to increase amount of physical activity.;Long Term: Exercising regularly at least 3-5 days a  week.;Long Term: Add in home exercise to make exercise part of routine and to increase amount of physical activity. Short Term: Attend rehab on a regular basis to increase amount of physical activity.;Long Term: Exercising regularly at least 3-5 days a week.;Long Term: Add in home exercise to make exercise part of routine and to increase amount of physical activity. Short Term: Attend rehab on a regular basis to increase amount  of physical activity.;Long Term: Exercising regularly at least 3-5 days a week.;Long Term: Add in home exercise to make exercise part of routine and to increase amount of physical activity. Short Term: Attend rehab on a regular basis to increase amount of physical activity.;Long Term: Exercising regularly at least 3-5 days a week.;Long Term: Add in home exercise to make exercise part of routine and to increase amount of physical activity.    Increase Strength and Stamina Yes Yes Yes Yes    Intervention Provide advice, education, support and counseling about physical activity/exercise needs.;Develop an individualized exercise prescription for aerobic and resistive training based on initial evaluation findings, risk stratification, comorbidities and participant's personal goals. Provide advice, education, support and counseling about physical activity/exercise needs.;Develop an individualized exercise prescription for aerobic and resistive training based on initial evaluation findings, risk stratification, comorbidities and participant's personal goals. Provide advice, education, support and counseling about physical activity/exercise needs.;Develop an individualized exercise prescription for aerobic and resistive training based on initial evaluation findings, risk stratification, comorbidities and participant's personal goals. Provide advice, education, support and counseling about physical activity/exercise needs.;Develop an individualized exercise prescription for aerobic and resistive training based on initial evaluation findings, risk stratification, comorbidities and participant's personal goals.    Expected Outcomes Short Term: Increase workloads from initial exercise prescription for resistance, speed, and METs.;Short Term: Perform resistance training exercises routinely during rehab and add in resistance training at home;Long Term: Improve cardiorespiratory fitness, muscular endurance and strength as  measured by increased METs and functional capacity (6MWT) Short Term: Increase workloads from initial exercise prescription for resistance, speed, and METs.;Short Term: Perform resistance training exercises routinely during rehab and add in resistance training at home;Long Term: Improve cardiorespiratory fitness, muscular endurance and strength as measured by increased METs and functional capacity (6MWT) Short Term: Increase workloads from initial exercise prescription for resistance, speed, and METs.;Short Term: Perform resistance training exercises routinely during rehab and add in resistance training at home;Long Term: Improve cardiorespiratory fitness, muscular endurance and strength as measured by increased METs and functional capacity (6MWT) Short Term: Increase workloads from initial exercise prescription for resistance, speed, and METs.;Short Term: Perform resistance training exercises routinely during rehab and add in resistance training at home;Long Term: Improve cardiorespiratory fitness, muscular endurance and strength as measured by increased METs and functional capacity (6MWT)    Able to understand and use rate of perceived exertion (RPE) scale Yes Yes Yes Yes    Intervention Provide education and explanation on how to use RPE scale Provide education and explanation on how to use RPE scale Provide education and explanation on how to use RPE scale Provide education and explanation on how to use RPE scale    Expected Outcomes Short Term: Able to use RPE daily in rehab to express subjective intensity level;Long Term:  Able to use RPE to guide intensity level when exercising independently Short Term: Able to use RPE daily in rehab to express subjective intensity level;Long Term:  Able to use RPE to guide intensity level when exercising independently Short Term: Able to use RPE daily in rehab to express  subjective intensity level;Long Term:  Able to use RPE to guide intensity level when exercising  independently Short Term: Able to use RPE daily in rehab to express subjective intensity level;Long Term:  Able to use RPE to guide intensity level when exercising independently    Able to understand and use Dyspnea scale Yes Yes Yes Yes    Intervention Provide education and explanation on how to use Dyspnea scale Provide education and explanation on how to use Dyspnea scale Provide education and explanation on how to use Dyspnea scale Provide education and explanation on how to use Dyspnea scale    Expected Outcomes Short Term: Able to use Dyspnea scale daily in rehab to express subjective sense of shortness of breath during exertion;Long Term: Able to use Dyspnea scale to guide intensity level when exercising independently Short Term: Able to use Dyspnea scale daily in rehab to express subjective sense of shortness of breath during exertion;Long Term: Able to use Dyspnea scale to guide intensity level when exercising independently Short Term: Able to use Dyspnea scale daily in rehab to express subjective sense of shortness of breath during exertion;Long Term: Able to use Dyspnea scale to guide intensity level when exercising independently Short Term: Able to use Dyspnea scale daily in rehab to express subjective sense of shortness of breath during exertion;Long Term: Able to use Dyspnea scale to guide intensity level when exercising independently    Knowledge and understanding of Target Heart Rate Range (THRR) Yes Yes Yes Yes    Intervention Provide education and explanation of THRR including how the numbers were predicted and where they are located for reference Provide education and explanation of THRR including how the numbers were predicted and where they are located for reference Provide education and explanation of THRR including how the numbers were predicted and where they are located for reference Provide education and explanation of THRR including how the numbers were predicted and where they are  located for reference    Expected Outcomes Short Term: Able to state/look up THRR;Long Term: Able to use THRR to govern intensity when exercising independently;Short Term: Able to use daily as guideline for intensity in rehab Short Term: Able to state/look up THRR;Long Term: Able to use THRR to govern intensity when exercising independently;Short Term: Able to use daily as guideline for intensity in rehab Short Term: Able to state/look up THRR;Long Term: Able to use THRR to govern intensity when exercising independently;Short Term: Able to use daily as guideline for intensity in rehab Short Term: Able to state/look up THRR;Long Term: Able to use THRR to govern intensity when exercising independently;Short Term: Able to use daily as guideline for intensity in rehab    Understanding of Exercise Prescription Yes Yes Yes Yes    Intervention Provide education, explanation, and written materials on patient's individual exercise prescription Provide education, explanation, and written materials on patient's individual exercise prescription Provide education, explanation, and written materials on patient's individual exercise prescription Provide education, explanation, and written materials on patient's individual exercise prescription    Expected Outcomes Short Term: Able to explain program exercise prescription;Long Term: Able to explain home exercise prescription to exercise independently Short Term: Able to explain program exercise prescription;Long Term: Able to explain home exercise prescription to exercise independently Short Term: Able to explain program exercise prescription;Long Term: Able to explain home exercise prescription to exercise independently Short Term: Able to explain program exercise prescription;Long Term: Able to explain home exercise prescription to exercise independently  Exercise Goals Re-Evaluation :  Exercise Goals Re-Evaluation     Row Name 04/16/22 0934 05/10/22  1017 06/03/22 1138         Exercise Goal Re-Evaluation   Exercise Goals Review Increase Physical Activity;Able to understand and use Dyspnea scale;Understanding of Exercise Prescription;Increase Strength and Stamina;Knowledge and understanding of Target Heart Rate Range (THRR);Able to understand and use rate of perceived exertion (RPE) scale Increase Physical Activity;Able to understand and use Dyspnea scale;Understanding of Exercise Prescription;Increase Strength and Stamina;Knowledge and understanding of Target Heart Rate Range (THRR);Able to understand and use rate of perceived exertion (RPE) scale Increase Physical Activity;Able to understand and use Dyspnea scale;Understanding of Exercise Prescription;Increase Strength and Stamina;Knowledge and understanding of Target Heart Rate Range (THRR);Able to understand and use rate of perceived exertion (RPE) scale     Comments Pt will begin exercise next week. Will monitor and progress as tolerated. Pt has completed 4 exercise sessions. He missed two sessions, one due to tachycardia and one due to weather. Pt is exercising on the recumbent stepper for 15 min, level 2, METs 1.7. He then walks the track pushing a walker or wheelchair for 15 min, METs 2.08. He performs warm up and cool down standing with demonstrative cues. He is using red bands for resistance training. He is slowly progressing.  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 Will monitor and progress as tolerated. Pt has completed 11 exercise sessions. He has missed two sessions, one due to tachycardia and one due to weather. Pt is exercising on the recumbent stepper for 15 min, level 3, METs 2.3. He then walks the track pushing a four wheeled walker for 15 min, METs 2.23. He has been feeling more confident and pushing himself more, increasing progression. He performs warm up and cool down standing. He is using red bands for resistance training.  Will monitor and progress as tolerated.     Expected  Outcomes Through exercise at rehab and home, the patient will decrease shortness of breath with daily activities and feel confident in carrying out an exercise regimen at home. Through exercise at rehab and home, the patient will decrease shortness of breath with daily activities and feel confident in carrying out an exercise regimen at home. Through exercise at rehab and home, the patient will decrease shortness of breath with daily activities and feel confident in carrying out an exercise regimen at home.              Discharge Exercise Prescription (Final Exercise Prescription Changes):  Exercise Prescription Changes - 06/11/22 1500       Response to Exercise   Blood Pressure (Admit) 110/80    Blood Pressure (Exercise) 110/60    Blood Pressure (Exit) 100/58    Heart Rate (Admit) 119 bpm    Heart Rate (Exercise) 131 bpm    Heart Rate (Exit) 125 bpm    Oxygen Saturation (Admit) 97 %    Oxygen Saturation (Exercise) 92 %    Oxygen Saturation (Exit) 95 %    Rating of Perceived Exertion (Exercise) 11    Perceived Dyspnea (Exercise) 1    Duration Continue with 30 min of aerobic exercise without signs/symptoms of physical distress.    Intensity THRR unchanged      Progression   Progression Continue to progress workloads to maintain intensity without signs/symptoms of physical distress.      Resistance Training   Training Prescription Yes    Weight Red Bands    Reps 10-15    Time  10 Minutes      Oxygen   Oxygen --   D/C     NuStep   Level 3    SPM 60    Minutes 15    METs 2.3      Track   Laps 7    Minutes 15    METs 2.08      Oxygen   Maintain Oxygen Saturation 88% or higher             Nutrition:  Target Goals: Understanding of nutrition guidelines, daily intake of sodium <156m, cholesterol <201m calories 30% from fat and 7% or less from saturated fats, daily to have 5 or more servings of fruits and vegetables.  Biometrics:    Nutrition Therapy Plan  and Nutrition Goals:  Nutrition Therapy & Goals - 05/30/22 1430       Nutrition Therapy   Diet General Healthy Diet    Drug/Food Interactions Statins/Certain Fruits      Personal Nutrition Goals   Nutrition Goal Patient to identify strategies for weight gain of 0.5-2.0# per week    Comments Goals in progress. Jeff Willis reports eating three meals daily, drinking one Ensure daily, and drinking one homemade milkshake daily to aid with meeting nutrition needs and weight gain. He reports stable appetite and eating patterns. His family remains supportive. He will continue to benefit from participation in pulmonary rehab for nutrition, exercise, and lifestyle modification.      Intervention Plan   Intervention Prescribe, educate and counsel regarding individualized specific dietary modifications aiming towards targeted core components such as weight, hypertension, lipid management, diabetes, heart failure and other comorbidities.;Nutrition handout(s) given to patient.    Expected Outcomes Short Term Goal: Understand basic principles of dietary content, such as calories, fat, sodium, cholesterol and nutrients.;Long Term Goal: Adherence to prescribed nutrition plan.             Nutrition Assessments:  Nutrition Assessments - 06/07/22 0820       Rate Your Plate Scores   Post Score 53            MEDIFICTS Score Key: ?70 Need to make dietary changes  40-70 Heart Healthy Diet ? 40 Therapeutic Level Cholesterol Diet  Flowsheet Row [facility]  Picture Your Plate Total Score on Discharge 53      Picture Your Plate Scores: <[id number] dietary pattern with much room for improvement. 41-50 Dietary pattern unlikely to meet recommendations for good health and room for improvement. 51-60 More healthful dietary pattern, with some room for improvement.  >60 Healthy dietary pattern,  although there may be some specific behaviors that could be improved.    Nutrition Goals Re-Evaluation:  Nutrition Goals Re-Evaluation     [place] 05/02/22 1453 05/30/22 1430           Goals   Current Weight 112 lb 7 oz (51 kg) 111 lb 1.8 oz (50.4 kg)      Comment albumin 3.3, vitamin D 28.5 weight is down 1.3# over the last month.      Expected Outcome "Jeff Willis" reports typcial adult weight ~125-140# (124# on 11/14/2021, 138# 08/04/2019) with significant weight loss and diagnosis of protein calorie malnutrition since hospitalization in September 2023. He reports eating three meals daily and 1 Ensure daily. He does reports decrease appetite overall. He lives at home with his wife and has excellent support from his son and daughter  in law as well. Patient will continue to benefit from participating in pulmonary rehab for nutrition education and exercise. Goals in progress. Jeff Willis reports eating three meals daily, drinking one Ensure daily, and drinking one homemade milkshake daily to aid with meeting nutrition needs and weight gain. He reports stable appetite and eating patterns. Encouraged adding in 2 Ensure daily to aid with weight loss (220kcals, 9g protein each). His family remains supportive. He will continue to benefit from participation in pulmonary rehab for nutrition, exercise, and lifestyle modification.               Nutrition Goals Discharge (Final Nutrition Goals Re-Evaluation):  Nutrition Goals Re-Evaluation - 05/30/22 1430       Goals   Current Weight 111 lb 1.8 oz (50.4 kg)    Comment weight is down 1.3# over the last month.    Expected Outcome Goals in progress. Jeff Willis reports eating three meals daily, drinking one Ensure daily, and drinking one homemade milkshake daily to aid with meeting nutrition needs and weight gain. He reports stable appetite and eating patterns. Encouraged adding in 2 Ensure daily to aid with weight loss (220kcals, 9g protein each). His family remains  supportive. He will continue to benefit from participation in pulmonary rehab for nutrition, exercise, and lifestyle modification.             Psychosocial: Target Goals: Acknowledge presence or absence of significant depression and/or stress, maximize coping skills, provide positive support system. Participant is able to verbalize types and ability to use techniques and skills needed for reducing stress and depression.  Initial Review & Psychosocial Screening:  Initial Psych Review & Screening - 04/15/22 1057       Initial Review   Current issues with None Identified      Family Dynamics   Good Support System? Yes    Comments spouse and children      Barriers   Psychosocial barriers to participate in program There are no identifiable barriers or psychosocial needs.;The patient should benefit from training in stress management and relaxation.      Screening Interventions   Interventions Encouraged to exercise             Quality of Life Scores:  Scores of 19 and below usually indicate a poorer quality of life in these areas.  A difference of  2-3 points is a clinically meaningful difference.  A difference of 2-3 points in the total score of the Quality of Life Index has been associated with significant improvement in overall quality of life, self-image, physical symptoms, and general health in studies assessing change in quality of life.  PHQ-9: Review Flowsheet       04/15/2022 10/08/2017  Depression screen PHQ 2/9  Decreased Interest 0 0  Down, Depressed, Hopeless 0 0  PHQ - 2 Score 0 0  Altered sleeping 0 -  Tired, decreased energy 0 -  Change in appetite 0 -  Feeling bad or failure about yourself  0 -  Trouble concentrating 0 -  Moving slowly or fidgety/restless 0 -  Suicidal thoughts 0 -  PHQ-9 Score 0 -  Difficult doing work/chores Not difficult at all -   Interpretation of Total Score  Total Score Depression Severity:  1-4 = Minimal depression, 5-9 =  Mild depression, 10-14 = Moderate depression, 15-19 = Moderately severe depression, 20-27 = Severe depression   Psychosocial Evaluation and Intervention:  Psychosocial Evaluation - 04/15/22 1058       Psychosocial Evaluation &  Interventions   Interventions Encouraged to exercise with the program and follow exercise prescription;Stress management education;Relaxation education    Comments Jeff Willis denies psychosocial barriers to PR    Expected Outcomes For Jeff Willis to participate in PR free of psychosocial barriers.    Continue Psychosocial Services  No Follow up required             Psychosocial Re-Evaluation:  Psychosocial Re-Evaluation     Jeff Willis Name 04/17/22 1400 05/15/22 1101 06/04/22 0852         Psychosocial Re-Evaluation   Current issues with None Identified None Identified None Identified     Comments Jeff Willis has completed orientation to the PR program. He is scheduled to start exercising 04/23/22 at 1:15 pm. We will continue to evaluate for any psychosocial barriers as he attends. Jeff Willis has completed 6 PR classes. His son or daughter-in-law bring him to/from class. He and his wife are currently living with his son, daughter-in-law and grandkids. He states that he has great support from them. Jeff Willis currently denies any psychosocial barriers or concerns. We will continue to monitor and evaluate him during his PR sessions. Jeff Willis has completed 11 PR classes. His son or daughter-in-law bring him to/from class. He and his wife are currently living with his son, daughter-in-law and grandkids. He states that he has great support from them. Jeff Willis currently denies any psychosocial barriers or concerns. Jeff Willis has been weaned off oxygen even while exercising, but he is fearful of becoming SOB and needing the oxygen. We will continue to monitor and evaluate him during his PR sessions.     Expected Outcomes For him to participate in the program without any psychosocial issues or concerns. For Jeff Willis  to participate in the program without any psychosocial issues or concerns. For Jeff Willis to participate in the program without any psychosocial issues or concerns.     Interventions Encouraged to attend Pulmonary Rehabilitation for the exercise Encouraged to attend Pulmonary Rehabilitation for the exercise Encouraged to attend Pulmonary Rehabilitation for the exercise     Continue Psychosocial Services  No Follow up required No Follow up required No Follow up required              Psychosocial Discharge (Final Psychosocial Re-Evaluation):  Psychosocial Re-Evaluation - 06/04/22 [id number]       Psychosocial Re-Evaluation   Current issues with None Identified    Comments Jeff Willis has completed 11 PR classes. His son or daughter-in-law bring him to/from class. He and his wife are currently living with his son, daughter-in-law and grandkids. He states that he has great support from them. Jeff Willis currently denies any psychosocial barriers or concerns. Jeff Willis has been weaned off oxygen even while exercising, but he is fearful of becoming SOB and needing the oxygen. We will continue to monitor and evaluate him during his PR sessions.    Expected Outcomes For Jeff Willis to participate in the program without any psychosocial issues or concerns.    Interventions Encouraged to attend Pulmonary Rehabilitation for the exercise    Continue Psychosocial Services  No Follow up required             Education: Education Goals: Education classes will be provided on a weekly basis, covering required topics. Participant will state understanding/return demonstration of topics presented.  Learning Barriers/Preferences:  Learning Barriers/Preferences - 04/15/22 1100       Learning Barriers/Preferences   Learning Barriers Sight   reading glasses   Learning Preferences Skilled Demonstration  Education Topics: Introduction to Pulmonary Rehab Group instruction provided by PowerPoint, verbal discussion, and  written material to support subject matter. Instructor reviews what Pulmonary Rehab is, the purpose of the program, and how patients are referred.     Know Your Numbers Group instruction that is supported by a PowerPoint presentation. Instructor discusses importance of knowing and understanding resting, exercise, and post-exercise oxygen saturation, heart rate, and blood pressure. Oxygen saturation, heart rate, blood pressure, rating of perceived exertion, and dyspnea are reviewed along with a normal range for these values.    Exercise for the Pulmonary Patient Group instruction that is supported by a PowerPoint presentation. Instructor discusses benefits of exercise, core components of exercise, frequency, duration, and intensity of an exercise routine, importance of utilizing pulse oximetry during exercise, safety while exercising, and options of places to exercise outside of rehab.  Flowsheet Row PULMONARY REHAB OTHER RESPIRATORY from 05/16/2022 in [facility], [facility], [facility]  Date 05/16/22  Educator EP  Instruction Review Code 5- Refused Teaching       MET Level  Group instruction provided by PowerPoint, verbal discussion, and written material to support subject matter. Instructor reviews what METs are and how to increase METs.    Pulmonary Medications Verbally interactive group education provided by instructor with focus on inhaled medications and proper administration. Flowsheet Row PULMONARY REHAB OTHER RESPIRATORY from 05/09/2022 in [facility] for Heart, Vascular, & [place]  Date 05/09/22  Educator EP  Instruction Review Code 1- Verbalizes Understanding       Anatomy and Physiology of the Respiratory System Group instruction provided by PowerPoint, verbal discussion, and written material to support subject matter. Instructor reviews respiratory cycle and anatomical components of the respiratory system and  their functions. Instructor also reviews differences in obstructive and restrictive respiratory diseases with examples of each.    Oxygen Safety Group instruction provided by PowerPoint, verbal discussion, and written material to support subject matter. There is an overview of "What is Oxygen" and "Why do we need it".  Instructor also reviews how to create a safe environment for oxygen use, the importance of using oxygen as prescribed, and the risks of noncompliance. There is a brief discussion on traveling with oxygen and resources the patient may utilize. Flowsheet Row PULMONARY REHAB OTHER RESPIRATORY from 05/23/2022 in [facility] for Heart, Vascular, & Lung Health  Date 05/23/22  Educator RN  Instruction Review Code 1- Verbalizes Understanding       Oxygen Use Group instruction provided by PowerPoint, verbal discussion, and written material to discuss how supplemental oxygen is prescribed and different types of oxygen supply systems. Resources for more information are provided.  Flowsheet Row PULMONARY REHAB OTHER RESPIRATORY from [facility]30/2022 in [facility], Vascular, & Lung Health  Date 05/30/22  Educator RT  Instruction Review Code 1- Verbalizes Understanding       Breathing Techniques Group instruction that is supported by demonstration and informational handouts. Instructor discusses the benefits of pursed lip and diaphragmatic breathing and detailed demonstration on how to perform both.  Flowsheet Row PULMONARY REHAB OTHER RESPIRATORY from 06/06/2022 in [facility] for Heart, Vascular, & Lung Health  Date 06/06/22  Educator EP  Instruction Review Code 1- Verbalizes Understanding        Risk Factor Reduction Group instruction that is supported by a PowerPoint presentation. Instructor discusses the definition of a risk factor, different risk factors  for pulmonary disease, and how the heart and lungs  work together.   MD Day A group question and answer session with a medical doctor that allows participants to ask questions that relate to their pulmonary disease state.   Nutrition for the Pulmonary Patient Group instruction provided by PowerPoint slides, verbal discussion, and written materials to support subject matter. The instructor gives an explanation and review of healthy diet recommendations, which includes a discussion on weight management, recommendations for fruit and vegetable consumption, as well as protein, fluid, caffeine, fiber, sodium, sugar, and alcohol. Tips for eating when patients are short of breath are discussed.    Other Education Group or individual verbal, written, or video instructions that support the educational goals of the pulmonary rehab program.    Knowledge Questionnaire Score:  Knowledge Questionnaire Score - 04/15/22 1115       Knowledge Questionnaire Score   Pre Score 11/18             Core Components/Risk Factors/Patient Goals at Admission:  Personal Goals and Risk Factors at Admission - 04/15/22 1104       Core Components/Risk Factors/Patient Goals on Admission    Weight Management Weight Gain;Yes    Intervention Weight Management: Develop a combined nutrition and exercise program designed to reach desired caloric intake, while maintaining appropriate intake of nutrient and fiber, sodium and fats, and appropriate energy expenditure required for the weight goal.;Weight Management: Provide education and appropriate resources to help participant work on and attain dietary goals.    Expected Outcomes Short Term: Continue to assess and modify interventions until short term weight is achieved;Long Term: Adherence to nutrition and physical activity/exercise program aimed toward attainment of established weight goal;Weight Maintenance: Understanding of the daily nutrition guidelines, which includes 25-35% calories from fat, 7% or less cal from  saturated fats, less than 289m cholesterol, less than 1.5gm of sodium, & 5 or more servings of fruits and vegetables daily;Weight Gain: Understanding of general recommendations for a high calorie, high protein meal plan that promotes weight gain by distributing calorie intake throughout the day with the consumption for 4-5 meals, snacks, and/or supplements;Understanding of distribution of calorie intake throughout the day with the consumption of 4-5 meals/snacks;Understanding recommendations for meals to include 15-35% energy as protein, 25-35% energy from fat, 35-60% energy from carbohydrates, less than 2066mof dietary cholesterol, 20-35 gm of total fiber daily    Improve shortness of breath with ADL's Yes    Intervention Provide education, individualized exercise plan and daily activity instruction to help decrease symptoms of SOB with activities of daily living.    Expected Outcomes Short Term: Improve cardiorespiratory fitness to achieve a reduction of symptoms when performing ADLs;Long Term: Be able to perform more ADLs without symptoms or delay the onset of symptoms             Core Components/Risk Factors/Patient Goals Review:   Goals and Risk Factor Review     Row Name 04/17/22 1430 05/15/22 1105 06/04/22 0855         Core Components/Risk Factors/Patient Goals Review   Personal Goals Review Other;Improve shortness of breath with ADL's;Develop more efficient breathing techniques such as purse lipped breathing and diaphragmatic breathing and practicing self-pacing with activity.;Increase knowledge of respiratory medications and ability to use respiratory devices properly.  Weight gain Other;Improve shortness of breath with ADL's;Develop more efficient breathing techniques such as purse lipped breathing and diaphragmatic breathing and practicing self-pacing with activity.;Increase knowledge of respiratory medications and ability to use respiratory  devices properly.  Weight gain  Other;Improve shortness of breath with ADL's;Develop more efficient breathing techniques such as purse lipped breathing and diaphragmatic breathing and practicing self-pacing with activity.;Increase knowledge of respiratory medications and ability to use respiratory devices properly.  Weight gain     Review Cortavious has completed PR orientation and is scheduled to start exercising 04/23/22. We will continue to  monitor his core components and address them. Jeff Willis has completed 6 PR classes. He is currently exercising on the NuStep and the Recumbent Bike.   He needs assistance from transferring from 1 piece of equipment to another because of his deconditioning, unstable gait and trying to maneuver his oxygen tank. Staff also assist him in setting up his exercise equipment. Jeff Willis is making progress and has increased his workload. He has began using pursued lip breathing when short of breath but needs reminders to use it on exertion. Jeff Willis has declined education classes due to his family working and driving him to/from class. We have provided hand outs to Jeff Willis about exercise with the resistance bands, warming up, and cooling down at home. Jeff Willis has completed 11 PR classes. He is currently exercising on the NuStep and walking the track. His mets increased from 1 to 2.3 on the NuStep, but he is maintaining the same laps on the track. He now can transition from one piece of equipment to the other independently. Jeff Willis has increased his work level from 1 to 3 on the NuStep. He is using pursued lip breathing when short of breath but needs reminders to use it on exertion. Jeff Willis has attended oxygen safety and oxygen use class. Jeff Willis has been working with our dietician to gain weight by drinking to protein shakes daily.     Expected Outcomes See admission goals. See admission goals. See admission goals.              Core Components/Risk Factors/Patient Goals at Discharge (Final Review):   Goals and Risk Factor  Review - 06/04/22 0855       Core Components/Risk Factors/Patient Goals Review   Personal Goals Review Other;Improve shortness of breath with ADL's;Develop more efficient breathing techniques such as purse lipped breathing and diaphragmatic breathing and practicing self-pacing with activity.;Increase knowledge of respiratory medications and ability to use respiratory devices properly.   Weight gain   Review Jeff Willis has completed 11 PR classes. He is currently exercising on the NuStep and walking the track. His mets increased from 1 to 2.3 on the NuStep, but he is maintaining the same laps on the track. He now can transition from one piece of equipment to the other independently. Jeff Willis has increased his work level from 1 to 3 on the NuStep. He is using pursued lip breathing when short of breath but needs reminders to use it on exertion. Jeff Willis has attended oxygen safety and oxygen use class. Jeff Willis has been working with our dietician to gain weight by drinking to protein shakes daily.    Expected Outcomes See admission goals.             ITP Comments:   Comments: Dr. Rodman Pickle is Medical Director for Pulmonary Rehab at [facility].

## 2022-06-13 ENCOUNTER — Encounter (HOSPITAL_COMMUNITY)
Admission: RE | Admit: 2022-06-13 | Discharge: 2022-06-13 | Disposition: A | Discharge status: Home / Self Care | Payer: Medicare Other | Source: Ambulatory Visit | Attending: Internal Medicine | Admitting: Internal Medicine

## 2022-06-13 DIAGNOSIS — J849 Interstitial pulmonary disease, unspecified: Secondary | ICD-10-CM | POA: Diagnosis not present

## 2022-06-13 NOTE — Progress Notes (Signed)
Daily Session Note  Patient Details  Name: Jeff Willis MRN: [id number] Date of Birth: 10-15-1940 Referring Provider:   April Manson Pulmonary Rehab Walk Test from 04/15/2022 in [facility] [place]  Referring Provider [name]       Encounter Date: 06/13/2022  Check In:  Session Check In - 06/13/22 1426       Check-In   Supervising physician immediately available to respond to emergencies [facility] MD immediately available    Physician(s) Erin Hearing, NP    Location [facility]    Staff Present Janine Ores, RN, Quentin Ore, MS, ACSM-CEP, Exercise Physiologist;Randi Yevonne Pax, ACSM-CEP, Exercise Physiologist;Samantha [place], RD, LDN;Other    Virtual Visit No    Medication changes reported     No    Fall or balance concerns reported    No    Tobacco Cessation No Change    Warm-up and Cool-down Performed as group-led instruction    Resistance Training Performed No    VAD Patient? No    PAD/SET Patient? No      Pain Assessment   Currently in Pain? No/denies    Multiple Pain Sites No             Capillary Blood Glucose: No results found for this or any previous visit (from the past 24 hour(s)).    Social History   Tobacco Use  Smoking Status Never  Smokeless Tobacco Never    Goals Met:  Proper associated with RPD/PD & O2 Sat Exercise tolerated well No report of concerns or symptoms today Strength training completed today  Goals Unmet:  Not Applicable  Comments: Service time is from 1323 to 1450.    Dr. Rodman Pickle is Medical Director for Pulmonary Rehab at [facility].

## 2022-06-18 ENCOUNTER — Encounter (HOSPITAL_COMMUNITY)
Admission: RE | Admit: 2022-06-18 | Discharge: 2022-06-18 | Disposition: A | Discharge status: Home / Self Care | Payer: Medicare Other | Source: Ambulatory Visit | Attending: Internal Medicine | Admitting: Internal Medicine

## 2022-06-18 DIAGNOSIS — J849 Interstitial pulmonary disease, unspecified: Secondary | ICD-10-CM

## 2022-06-18 NOTE — Progress Notes (Signed)
Daily Session Note  Patient Details  Name: Jeff Willis MRN: [id number] Date of Birth: 06-06-1940 Referring Provider:   April Manson Pulmonary Rehab Walk Test from 04/15/2022 in [facility] [place]  Referring Provider [name]       Encounter Date: 06/18/2022  Check In:  Session Check In - 06/18/22 1440       Check-In   Supervising physician immediately available to respond to emergencies [facility] MD immediately available    Physician(s) Eric Form, NP    Location [facility]    Staff Present Janine Ores, RN, Quentin Ore, MS, ACSM-CEP, Exercise Physiologist;Randi Yevonne Pax, ACSM-CEP, Exercise Physiologist;Samantha [place], RD, LDN;Other    Virtual Visit No    Medication changes reported     No    Fall or balance concerns reported    No    Tobacco Cessation No Change    Warm-up and Cool-down Performed as group-led instruction    Resistance Training Performed Yes    VAD Patient? No    PAD/SET Patient? No      Pain Assessment   Currently in Pain? No/denies    Multiple Pain Sites No             Capillary Blood Glucose: No results found for this or any previous visit (from the past 24 hour(s)).    Social History   Tobacco Use  Smoking Status Never  Smokeless Tobacco Never    Goals Met:  Proper associated with RPD/PD & O2 Sat Independence with exercise equipment Exercise tolerated well No report of concerns or symptoms today Strength training completed today  Goals Unmet:  Not Applicable  Comments: Service time is from 1328 to 1441.    Dr. Rodman Pickle is Medical Director for Pulmonary Rehab at [facility]

## 2022-06-20 ENCOUNTER — Encounter: Payer: Self-pay | Admitting: Internal Medicine

## 2022-06-20 ENCOUNTER — Ambulatory Visit: Payer: Medicare Other | Admitting: Internal Medicine

## 2022-06-20 ENCOUNTER — Encounter (HOSPITAL_COMMUNITY)
Admission: RE | Admit: 2022-06-20 | Discharge: 2022-06-20 | Disposition: A | Discharge status: Home / Self Care | Payer: Medicare Other | Source: Ambulatory Visit | Attending: Internal Medicine | Admitting: Internal Medicine

## 2022-06-20 VITALS — BP 124/62 | HR 116 | Temp 97.4°F | Ht 66.0 in | Wt 110.8 lb

## 2022-06-20 DIAGNOSIS — J849 Interstitial pulmonary disease, unspecified: Secondary | ICD-10-CM | POA: Diagnosis not present

## 2022-06-20 DIAGNOSIS — J209 Acute bronchitis, unspecified: Secondary | ICD-10-CM | POA: Diagnosis not present

## 2022-06-20 NOTE — Progress Notes (Signed)
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   Prior cough in  2019 for several months  resolved on on some kind of powder inhaler x one week = Advair     History of Present Illness  08/04/2019  Pulmonary/ 1st office eval/Jeff Willis  Chief Complaint  Patient presents with   Pulmonary Consult    Referred by Dr Jeff Willis. Pt c/o non prod cough and throat clearing x 6 months.   Dyspnea:  None/ no aerobics, walking neighborhoods ok  Cough: always dry/ any candy helps if keeps in mouth but esp hard candy / does not use cough drops  Sleep: rotates on side, bed is flat/ one pillow, never wakes up cough but /win 15 min of stirring recurs daily  SABA use: never, also never prednisone Always trouble swallowing pills x lifetime  Call Dr Jeff Willis office and ask what inhaler you received in 2019 as sample that seemed to help your cough = advair dpi Stop cetrizine, flonase  Prednisone 10 mg take  4 each am x 2 days,   2 each am x 2 days,  1 each am x 2 days and stop  Pantoprazole (protonix) 40 mg   Take  30-60 min before first meal of the day and Pepcid (famotidine)  20 mg one after supper  until return to office  GERD   Gabapentin 100 mg four times daily (ok to stop when no coughing at all for a week)  Please schedule a follow up office visit in 4 weeks, sooner if needed  with all medications /inhalers/ solutions in hand so we can verify exactly what you are taking. This includes all  medications from all doctors and over the counters    09/06/2019  f/u ov/Jeff Willis re: uacs on gabapenitn 100 mg three times  wolicki had tried gabapentin but failed to reveal that at last ov when I recommended it  Chief Complaint  Patient presents with   Follow-up    4 wk f/u. Wants to discuss switching Advair.   Dyspnea:  No  Cough: better just while on prednisone maybe 50-80%   Sleeping: no noct cough SABA use: none 02: none  rec Gabapentin 200 mg up to 4 x daily  Pantoprazole (protonix) 40 mg   Take  30-60 min before first meal of the day and Pepcid (famotidine)  20 mg one @  bedtime until return to office - this is the best way to tell whether stomach acid is contributing to your problem.   Keep the candy handy  Prednisone 10 mg  Take 4 for three days 3 for three days 2 for three days 1 for three days and stop  If not better in 2 weeks see Dr Jeff Willis for your reflux  Please remember to go to the lab department and xray dept  for your tests - we will  call you with the results when they are available.         OV 10/05/2019  Subjective:  [name], male , DOB: 02-Jun-1940 , age 82 y.o. , MRN: [id number] , ADDRESS: [place] [id number]   10/05/2019 -   Chief Complaint  Patient presents with   Follow-up    Pt states he has been doing okay since last visit and denies any complaints.   -82 year old Jeff Willis male.  Referred for interstitial lung disease.  He is accompanied by his son Jeff Willis who is a Software engineer.  I know Jeff Willis  from a few years ago when heused to accompany his aunt; asthma patient of mine. Jeff Willis  has has been referred by Dr. Christinia Willis to the ILD center.  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  Up until the pandemic used to travel quite frequently.  He is to go to [place] for 3 months.  At none of the properties he is being exposed to mold.  He developed insidious onset of chronic cough over a year ago  this has been persistent.  In the last few months several cough related treatments have been tried with some improvement.  Has been on gabapentin but this made him sleepy and is currently stopped it.  Because of chronic cough which he only attributes his constant clearing of the throat he had a high-resolution CT scan of the chest that I personally visualized and shows probable UIP pattern and therefore he has been referred here.  In my personal visualization it might even be definite of UIP because there might be some early honeycombing there.  He denies any shortness of breath.  At rest he was found to be tachycardic but he says this can at baseline for him.  He will follow this up with his primary care physician.  [facility]( ILD Questionnaire  Symptoms:   He denies any shortness of breath.  No episodic shortness of breath.  However on the questioning he does report mild shortness of breath climbing up stairs.  In terms of his cough it started in June 2020.  It is the same since it started maybe in the last week it is slightly better.  Is moderate in intensity.  He does clear the throat.  Mostly dry cough occasionally white sputum.  Does feel a tickle in the back of his throat.  He is not waking up in the middle of the night.  Occasionally affects his voice.  No nausea no vomiting no diarrhea no wheezing.   Past Medical History : Denies any asthma COPD or heart failure rheumatoid arthritis or scleroderma or lupus or polymyositis or Sjogren's.  Denies sleep apnea.  Denies HIV denies pulmonary hypertension.  Denies diabetes or thyroid disease or stroke or seizures mononucleosis.  Denies any hepatitis or tuberculosis or kidney disease or pneumonia or blood clots or heart disease or pleurisy.  Does have on and off acid reflux for the last year   ROS: Positive for dry mouth for the last several months.  He also has some dysphagia for swallowing pills unclear for what duration.  He  also has acid reflux on PPI and H2 blocker.  Denies any Raynaud's or recurrent fever or weight loss.  No nausea no vomiting.   FAMILY HISTORY of LUNG DISEASE: * -Denies family history of COPD or asthma sarcoid or cystic fibrosis of hypersensitive pneumonitis or autoimmune disease.   EXPOSURE HISTORY: Denies  any Covid.  Denies smoking cigarettes.  Denies marijuana denies smoking.  Denies IV drug use denies cocaine   HOME and HOBBY DETAILS : Single-family home [place] for the last 10 years.  Age of the home is 15 years.  No dampness.  No mildew.  No humidifier use.  No CPAP use no nebulizer machine use.  He does use a steam iron but there is no mold or mildew in it.  There is no [place] inside the house no pet birds or parakeets.  No pet gerbils no feather pillows no mold in the [facility] duct no music habits.  He does do some gardening very small garden in the house limited gardening mostly just water the garden.  No bird feather exposure no flood damage no strong mats no hot tub or Jacuzzi.  No exposure to animals at work.   OCCUPATIONAL HISTORY (122 questions) :   -Organic antigen exposure is negative inorganic exposure is negative   PULMONARY TOXICITY HISTORY (27 items):  -18-day prednisone course that ended last week.  He did have seen implantation for prostate cancer in October 2019 these were radiation seeds.      Results for Jeff Willis, Jeff Willis (MRN [id number]) as of 11/15/2019 16:27  Ref. Range 09/07/2019 11:29 10/05/2019 11:25  Anti Nuclear Antibody (ANA) Latest Ref Range: NEGATIVE   POSITIVE (A)  ANA Pattern 1 Unknown  Nuclear, Speckled (A)  ANA Titer 1 Latest Units: titer  1:40 (H)  Angiotensin-Converting Enzyme Latest Ref Range: 9 - 67 U/L  34  Cyclic Citrullin Peptide Ab Latest Units: UNITS  <16  ds DNA Ab Latest Units: IU/mL  <1  Myeloperoxidase Abs Latest Units: AI  <1.0  Serine Protease 3 Latest Units: AI  <1.0  RA Latex Turbid. Latest Ref Range: <14 IU/mL  <14   IgE (Immunoglobulin E), Serum Latest Ref Range: <OR=114 kU/L 36   SSA (Ro) (ENA) Antibody, IgG Latest Ref Range: <1.0 NEG AI  <1.0 NEG  SSB (La) (ENA) Antibody, IgG Latest Ref Range: <1.0 NEG AI  <1.0 NEG  Scleroderma (Scl-70) (ENA) Antibody, IgG Latest Ref Range: <1.0 NEG AI  <1.0 NEG     Simple office walk 185 feet x  3 laps goal with forehead probe 10/05/2019   O2 used ra  Number laps completed 3  Comments about pace avt  Resting Pulse Ox/HR 100% and 122/min  Final Pulse Ox/HR 100% and 143/min  Desaturated </= 88% x  Desaturated <= 3% points x  Got Tachycardic >/= 90/min x  Symptoms at end of test x  Miscellaneous comments x    Results for JERMIR, KALM (MRN [id number]) as of 10/05/2019 10:40  Ref. Range 09/07/2019 11:29  Eosinophils Absolute Latest Ref Range: 0.0 - 0.7 K/uL 0.1     High-resolution CT chest Sep 15, 2019: Personally visualized and agree with the findings below IMPRESSION: 1. Spectrum of findings compatible with basilar predominant fibrotic interstitial lung disease with suggestion of early honeycombing. 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 [place], Iss 5, [phone], Dec 28 2016. 2. Ectatic 4.4 cm ascending thoracic aorta. Recommend annual imaging followup by CTA or MRA. This recommendation follows 2010 ACCF/AHA/AATS/ACR/ASA/SCA/SCAI/SIR/STS/SVM Guidelines for the Diagnosis and Management of Patients with Thoracic Aortic Disease. Circulation. 2010; [id number]. Aortic aneurysm NOS (ICD10-I71.9). 3. One vessel coronary atherosclerosis. 4. Aortic Atherosclerosis (ICD10-I70.0).     Electronically Signed  By: Ilona Sorrel M.D.   On: 09/15/2019 14:26     Results for KASRA, HELLBERG (MRN [id number]) as of 10/05/2019 10:40  Ref. Range 09/07/2019 11:29  Sheep Sorrel IgE Latest Units: kU/L  <0.10  Pecan/Hickory Tree IgE Latest Units: kU/L <0.10  IgE (Immunoglobulin E), Serum Latest Ref Range: <OR=114 kU/L 36  Allergen, D pternoyssinus,d7 Latest Units: kU/L <0.10  Cat Dander Latest Units: kU/L <0.10  Dog Dander Latest Units: kU/L <0.10  [place] Grass Latest Units: kU/L <0.10  Johnson Grass Latest Units: kU/L <0.10  Timothy Grass Latest Units: kU/L <0.10  Cockroach Latest Units: kU/L <0.10  Aspergillus fumigatus, m3 Latest Units: kU/L <0.10  Allergen, Comm Silver Wendee Copp, t9 Latest Units: kU/L <0.10  Allergen, Cottonwood, t14 Latest Units: kU/L <0.10  Elm IgE Latest Units: kU/L <0.10  Allergen, Mulberry, t76 Latest Units: kU/L <0.10  Allergen, Oak,t7 Latest Units: kU/L <0.10  COMMON RAGWEED (SHORT) (W1) IGE Latest Units: kU/L 0.15 (H)  Allergen, Mouse Urine Protein, e78 Latest Units: kU/L <0.10  D. farinae Latest Units: kU/L <0.10  Allergen, Cedar tree, t12 Latest Units: kU/L <0.10  Box Elder IgE Latest Units: kU/L <0.10  Rough Pigweed  IgE Latest Units: kU/L <0.10    ROS - per HPI   OV 11/15/2019  Subjective:  [name], male , DOB: 14-Apr-1941 , age 74 y.o. , MRN: [id number] , ADDRESS: [place] [id number]   11/15/2019 -   Chief Complaint  Patient presents with   Follow-up    Pt states the cough has been better since last visit.      HPI Wilson 82 y.o. -presents with his son Jeff Willis for follow-up.  He is here to discuss test results.  In the interim he called for worsening cough.  We gave him prednisone.  He tells me the prednisone actually made the cough worse particularly towards the end.  After that the son  doubled up his PPI and also added H2 blockade and the cough went away completely.  Patient feels completely normal that he is possible that he has interstitial lung disease.  We reviewed the scan and I visualized it with him together and his son.  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  They wanted to discuss the implications  of the scan in the setting of controlled acid reflux and controlled cough but he is largely asymptomatic at this point.  Noted his serologies were negative except for trace ANA and his overnight oxygen study was normal.     OV 01/17/2020   Subjective:  [name], male , DOB: [birth year], age 37 y.o. years. , MRN: [id number],  ADDRESS: [place] [address] PCP  Gaynelle Arabian, MD Providers : Treatment Team:  Attending Provider: Brand Males, MD   Chief Complaint  Patient presents with   Follow-up    pt is here to go over ct and pft    Follow-up interstitial lung disease Follow-up associated cough partially controlled with acid reflux treatment Associated weight loss present   HPI Jeff Willis 82 y.o. -presents with his son Jeff Willis.  In the interim he is continue to lose weight.  He has lost another 3-4 pounds since June 2021.  His clothes are much looser.  He thinks he has been steadily losing weight.  His respiratory symptoms are not any worse although he says that his cough that seem to initially improve with doubling up of acid reflux therapy seems to persist.  At last visit in September 2020 when I thought the cough resolved but they tell me the cough only partially improved.  Nevertheless it is persisting and they are worried about it.  In addition he is worried about weight loss.  He also has poor appetite.  He is not in any pulmonary fibrosis antifibrotic's.  There is no diarrhea.  Symptom score suggest some element of anxiety since his last visit.  He has had pulmonary function test and it shows his FVC is actually better but his DLCO is actually worse.  He had high-resolution CT chest that I visualized and reviewed with him.  It shows probable UIP with possible emerging honeycombing.  I agree with the radiologist.  The radiologist actually feels that it might be a little bit worse compared to few months ago.  Of note: He has  upcoming trip to [place] between March 05, 2020 and February 2022.  He wants to make sure his care is coordinated.  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 Evaluate for pulmonary fibrosis.   EXAM: CT CHEST WITHOUT CONTRAST   TECHNIQUE: Multidetector CT imaging of the chest was performed following the standard protocol without intravenous contrast. High resolution imaging of the lungs, as well as inspiratory and expiratory imaging, was performed.   COMPARISON:  Chest CT 09/15/2019.   FINDINGS: Cardiovascular: Heart size is normal. There is no significant pericardial fluid, thickening or pericardial calcification. There is aortic atherosclerosis, as well as atherosclerosis of the great vessels of the mediastinum and the coronary arteries, including calcified atherosclerotic plaque in the left main, left anterior descending and right coronary arteries. Dilatation of the ascending thoracic aorta measuring 4.5 cm in diameter.   Mediastinum/Nodes: No pathologically enlarged mediastinal or hilar lymph nodes. Please note that accurate exclusion of hilar adenopathy is limited on noncontrast CT scans. Esophagus is unremarkable in appearance. No axillary lymphadenopathy.   Lungs/Pleura: High-resolution images demonstrate widespread areas of ground-glass attenuation, septal thickening, thickening of the peribronchovascular interstitium, mild cylindrical bronchiectasis and peripheral bronchiolectasis. There is a suggestion of, but not definitive evidence of, early honeycombing in portions of the lungs. These findings do have a definitive craniocaudal gradient and appear slightly progressive compared to the prior study. Inspiratory and expiratory imaging is unremarkable. No acute consolidative airspace disease. No pleural effusions. No definite suspicious  appearing pulmonary nodules or masses are noted.   Upper Abdomen: Cortical calcification in the upper pole of the right kidney incidentally noted.   Musculoskeletal: There are no aggressive appearing lytic or blastic lesions noted in the visualized portions of the skeleton.   IMPRESSION: 1. The appearance of the lungs is compatible with progressive interstitial lung disease categorized as probable usual interstitial pneumonia (UIP) per current ATS guidelines. 2. Aortic atherosclerosis, in addition to left main and 2 vessel coronary artery disease. In addition, there is mild aneurysmal dilatation of the ascending thoracic aorta (4.5 cm in diameter). Ascending thoracic aortic aneurysm. Recommend semi-annual imaging followup by CTA or MRA and referral to cardiothoracic surgery if not already obtained. This recommendation follows 2010 ACCF/AHA/AATS/ACR/ASA/SCA/SCAI/SIR/STS/SVM Guidelines for the Diagnosis and Management of Patients With Thoracic Aortic Disease. Circulation. 2010; [id number]. Aortic aneurysm NOS (ICD10-I71.9).   Aortic Atherosclerosis (ICD10-I70.0). Aortic aneurysm NOS (ICD10-I71.9).     Electronically Signed   By: Vinnie Langton M.D.   On: 01/13/2020 15:15  ROS - per HPI    OV 03/01/2020  Subjective:  [name], male , DOB: [birth year] , age 37 y.o. , MRN: [id number] , ADDRESS: [address] [place] [address] PCP Gaynelle Arabian, MD Patient Care Team: Gaynelle Arabian, MD as PCP - General (Family Medicine)  This Provider for this visit: Treatment Team:  Attending Provider: Brand Males, MD  FU IPF Associated weight loss + pre-ofev   03/01/2020 -   Chief Complaint  Patient presents with   Follow-up    ILD, doing ok, cough has not improved     HPI Jeff Willis 82 y.o. -returns for follow-up.  This is after starting nintedanib.  He is currently on 100 mg twice daily for 2 weeks.  His prescription is  approved on February 02, 2020.  He accidentally got 150 mg twice daily but he sent this back.  He is going to send [place] back.  He has upcoming travel to [place] later this week he will be gone there for till February 2022.  He has supply and refill of his nintedanib on 06 March 2020.  After that he does not have nintedanib.  He and his son report that somebody will be able to go to [place] and deliver it to him.  They do not want to trust the courier service to take it there the supply chain issues.  At this point time from a respiratory standpoint he has minimal dyspnea.  He still has a cough.  The cough is not worse but after reducing the PPI dose the cough has come back.  It is somewhat troublesome.  The severity score is listed below.  They report the GI Dr. Collene Willis has reported concern about taking very high-dose of PPI and H2 blockade which actually helped the cough.  Is because of osteoporosis risk.  I agreed with that concern.  However patient is also reporting significant symptom burden.  Primary care has suggested that they follow the symptom control and quality of life issues.  I did support the primary care viewpoint as well.  Did explain to them at the end of the day as a risk-benefit ratio and with an advanced disease like pulmonary fibrosis 1 might have to consider taking a short-term again with cough control at the expense of long-term risk of side effects.  They are going to think about this.  In terms of his travel to [place].  He put me in touch with Dr. Posey Pronto.  I texted him and establish contact.  He will follow with Dr. Posey Pronto who is a pulmonologist trained in [place] but now living in [place].  Gave other travel advice are listed below.  In terms of weight loss: This is ongoing.  Even after starting the Ofev is not having any GI side effects but his weight loss continues.  He had a CT abdomen that is not showing any reason for weight loss.  Apparently had some hematuria had cystoscopy with Dr. Junious Silk.  He  has some radiation seed implants.  There is no bladder cancer.  The really frustrated by this.  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   Prior cough in  2019 for several months  resolved on on some kind of powder inhaler x one week = Advair     History of Present Illness  08/04/2019  Pulmonary/ 1st office eval/Jeff Willis  Chief Complaint  Patient presents with   Pulmonary Consult    Referred by Dr Jeff Willis. Pt c/o non prod cough and throat clearing x 6 months.   Dyspnea:  None/ no aerobics, walking neighborhoods ok  Cough: always dry/ any candy helps if keeps in mouth but esp hard candy / does not use cough drops  Sleep: rotates on side, bed is flat/ one pillow, never wakes up cough but /win 15 min of stirring recurs daily  SABA use: never, also never prednisone Call Dr Jeff Willis office and ask what inhaler you received in 2019 as sample that seemed to help your cough = advair dpi Stop cetrizine, flonase  Prednisone 10 mg take  4 each am x 2 days,   2 each am x 2 days,  1 each am x 2 days and stop  Pantoprazole (protonix) 40 mg   Take  30-60 min before first meal of the day and Pepcid (famotidine)  20 mg one after supper  until return to office  GERD   Gabapentin 100 mg  four times daily (ok to stop when no coughing at all for a week)  Please schedule a follow up office visit in 4 weeks, sooner if needed  with all medications /inhalers/ solutions in hand so we can verify exactly what you are taking. This includes all medications from all doctors and over the counters    09/06/2019  f/u ov/Jeff Willis re: uacs on gabapenitn 100 mg three times  wolicki had tried gabapentin but failed to reveal that at last ov when I recommended it  Chief Complaint  Patient presents with   Follow-up    4 wk f/u. Wants to discuss switching Advair.   Dyspnea:  No  Cough: better just while on prednisone maybe 50-80%   Sleeping: no noct cough SABA use: none 02: none  rec Gabapentin 200 mg up to 4 x daily  Pantoprazole (protonix) 40 mg   Take  30-60 min before first meal of the day and Pepcid (famotidine)  20 mg one @  bedtime until return to office - this is the best way to tell whether stomach acid is contributing to your problem.   Keep the candy handy  Prednisone 10 mg  Take 4 for three days 3 for three days 2 for three days 1 for three days and stop  If not better in 2 weeks see Dr Jeff Willis for your reflux  Please remember to go to the lab department and xray dept  for your tests - we will call you with the results when they are available.         OV 10/05/2019  Subjective:  [name], male , DOB: July 16, 1940 ,  age 29 y.o. , MRN: [id number] , ADDRESS: [place] [address]   10/05/2019 -   Chief Complaint  Patient presents with   Follow-up    Pt states he has been doing okay since last visit and denies any complaints.   -82 year old [place] male.  Referred for interstitial lung disease.  He is accompanied by his son Jeff Willis who is a Software engineer.  I know Jeff Willis  from a few years ago when heused to accompany his aunt; asthma patient of mine. Aren IZAN ROSSELOT  has has been referred by Dr. Christinia Willis to the ILD center.  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  Up until the pandemic used to travel quite frequently.  He is to go to [place] for 3 months.  At none of the properties he is being exposed to mold.  He developed insidious onset of chronic cough over a year ago this has been persistent.  In the last few months several cough related treatments have been tried with some improvement.  Has been on gabapentin but this made him sleepy and is currently stopped it.  Because of chronic cough which he only attributes his constant clearing of the throat he had a high-resolution CT scan of the chest that I personally visualized and shows probable UIP pattern and therefore he has been referred here.  In my personal visualization it might even be definite of UIP because there might be some early honeycombing there.  He denies any shortness of breath.  At rest he was found to be tachycardic but he says this can at baseline for him.  He will follow this up with his primary care physician.  [place] [facility] Comprehensive( ILD Questionnaire  Symptoms:   He denies any shortness of breath.  No episodic shortness of breath.  However on the questioning he does report mild shortness of breath climbing up stairs.  In terms of his cough it started in June 2020.  It is the same since it started maybe in the last week it is slightly better.  Is moderate in intensity.  He does clear the throat.  Mostly dry cough occasionally white sputum.  Does feel a tickle in the back of his throat.  He is not waking up in the middle of the night.  Occasionally affects his voice.  No nausea no vomiting no diarrhea no wheezing.  SYMPTOM SCALE - ILD 10/05/2019   O2 use ra  Shortness of Breath 0 -> 5 scale with 5 being worst (score 6 If unable to do)  At rest 0  Simple tasks - showers, clothes change, eating, shaving 0  Household (dishes, doing bed, laundry) 0  Shopping 0  Walking level at own pace 0  Walking up Stairs 1  Total (30-36) Dyspnea Score  1  How bad is your cough? 2  How bad is your fatigue 0  How bad is nausea 0  How bad is vomiting?  00  How bad is diarrhea? 0  How bad is anxiety? 00  How bad is depression 0      Past Medical History : Denies any asthma COPD or heart failure rheumatoid arthritis or scleroderma or lupus or polymyositis or Sjogren's.  Denies sleep apnea.  Denies HIV denies pulmonary hypertension.  Denies diabetes or thyroid disease or stroke or seizures mononucleosis.  Denies any hepatitis or tuberculosis or kidney disease or pneumonia or blood clots or heart disease or pleurisy.  Does have on and off  acid reflux for the last year   ROS: Positive for dry mouth for the last several months.  He also has some dysphagia for swallowing pills unclear for what duration.  He also has acid reflux on PPI and H2 blocker.  Denies any Raynaud's or recurrent fever or weight loss.  No nausea no vomiting.   FAMILY HISTORY of LUNG DISEASE: * -Denies family history of COPD or asthma sarcoid or cystic fibrosis of hypersensitive pneumonitis or autoimmune disease.   EXPOSURE HISTORY: Denies any Covid.  Denies smoking cigarettes.  Denies marijuana denies smoking.  Denies IV drug use denies cocaine   HOME and HOBBY DETAILS : Single-family home [place] for the last 10 years.  Age of the home is 15 years.  No dampness.  No mildew.  No humidifier use.  No CPAP use no nebulizer machine use.  He does use a steam iron but there is no mold or mildew in it.  There is no [place] inside the house no pet birds or parakeets.  No pet gerbils no feather pillows no mold in the [facility] duct no music habits.  He does do some gardening very small garden in the house limited gardening mostly just water the garden.  No bird feather exposure no flood damage no strong mats no hot tub or Jacuzzi.  No exposure to animals at work.   OCCUPATIONAL HISTORY (122 questions) :   -Organic antigen exposure is negative inorganic exposure is  negative   PULMONARY TOXICITY HISTORY (27 items):  -18-day prednisone course that ended last week.  He did have seen implantation for prostate cancer in October 2019 these were radiation seeds.      Results for Jeff Willis, Jeff Willis (MRN [id number]) as of 11/15/2019 16:27  Ref. Range 09/07/2019 11:29 10/05/2019 11:25  Anti Nuclear Antibody (ANA) Latest Ref Range: NEGATIVE   POSITIVE (A)  ANA Pattern 1 Unknown  Nuclear, Speckled (A)  ANA Titer 1 Latest Units: titer  1:40 (H)  Angiotensin-Converting Enzyme Latest Ref Range: 9 - 67 U/L  34  Cyclic Citrullin Peptide Ab Latest Units: UNITS  <16  ds DNA Ab Latest Units: IU/mL  <1  Myeloperoxidase Abs Latest Units: AI  <1.0  Serine Protease 3 Latest Units: AI  <1.0  RA Latex Turbid. Latest Ref Range: <14 IU/mL  <14  IgE (Immunoglobulin E), Serum Latest Ref Range: <OR=114 kU/L 36   SSA (Ro) (ENA) Antibody, IgG Latest Ref Range: <1.0 NEG AI  <1.0 NEG  SSB (La) (ENA) Antibody, IgG Latest Ref Range: <1.0 NEG AI  <1.0 NEG  Scleroderma (Scl-70) (ENA) Antibody, IgG Latest Ref Range: <1.0 NEG AI  <1.0 NEG     Simple office walk 185 feet x  3 laps goal with forehead probe 10/05/2019   O2 used ra  Number laps completed 3  Comments about pace avt  Resting Pulse Ox/HR 100% and 122/min  Final Pulse Ox/HR 100% and 143/min  Desaturated </= 88% x  Desaturated <= 3% points x  Got Tachycardic >/= 90/min x  Symptoms at end of test x  Miscellaneous comments x    Results for Jeff Willis, Jeff Willis (MRN [id number]) as of 10/05/2019 10:40  Ref. Range 09/07/2019 11:29  Eosinophils Absolute Latest Ref Range: 0.0 - 0.7 K/uL 0.1     High-resolution CT chest Sep 15, 2019: Personally visualized and agree with the findings below IMPRESSION: 1. Spectrum of findings compatible with basilar predominant fibrotic interstitial lung disease with suggestion of early honeycombing. Findings are  categorized as probable UIP per consensus guidelines: Diagnosis of Idiopathic  Pulmonary Fibrosis: An Official ATS/ERS/JRS/ALAT Clinical Practice Guideline. [place], Iss 5, [id number], Dec 28 2016. 2. Ectatic 4.4 cm ascending thoracic aorta. Recommend annual imaging followup by CTA or MRA. This recommendation follows 2010 ACCF/AHA/AATS/ACR/ASA/SCA/SCAI/SIR/STS/SVM Guidelines for the Diagnosis and Management of Patients with Thoracic Aortic Disease. Circulation. 2010; [id number]. Aortic aneurysm NOS (ICD10-I71.9). 3. One vessel coronary atherosclerosis. 4. Aortic Atherosclerosis (ICD10-I70.0).     Electronically Signed   By: Ilona Sorrel M.D.   On: 09/15/2019 14:26     Results for Jeff Willis, Jeff Willis (MRN [id number]) as of 10/05/2019 10:40  Ref. Range 09/07/2019 11:29  Sheep Sorrel IgE Latest Units: kU/L <0.10  Pecan/Hickory Tree IgE Latest Units: kU/L <0.10  IgE (Immunoglobulin E), Serum Latest Ref Range: <OR=114 kU/L 36  Allergen, D pternoyssinus,d7 Latest Units: kU/L <0.10  Cat Dander Latest Units: kU/L <0.10  Dog Dander Latest Units: kU/L <0.10  [place] Grass Latest Units: kU/L <0.10  Johnson Grass Latest Units: kU/L <0.10  Timothy Grass Latest Units: kU/L <0.10  Cockroach Latest Units: kU/L <0.10  Aspergillus fumigatus, m3 Latest Units: kU/L <0.10  Allergen, Comm Silver Wendee Copp, t9 Latest Units: kU/L <0.10  Allergen, Cottonwood, t14 Latest Units: kU/L <0.10  Elm IgE Latest Units: kU/L <0.10  Allergen, Mulberry, t76 Latest Units: kU/L <0.10  Allergen, Oak,t7 Latest Units: kU/L <0.10  COMMON RAGWEED (SHORT) (W1) IGE Latest Units: kU/L 0.15 (H)  Allergen, Mouse Urine Protein, e78 Latest Units: kU/L <0.10  D. farinae Latest Units: kU/L <0.10  Allergen, Cedar tree, t12 Latest Units: kU/L <0.10  Box Elder IgE Latest Units: kU/L <0.10  Rough Pigweed  IgE Latest Units: kU/L <0.10    ROS - per HPI   OV 11/15/2019  Subjective:  [name], male , DOB: 05/28/1940 , age 5 y.o. , MRN: [id number] , ADDRESS:  [place] [id number]   11/15/2019 -   Chief Complaint  Patient presents with   Follow-up    Pt states the cough has been better since last visit.      HPI Jeff Willis 82 y.o. -presents with his son Jeff Willis for follow-up.  He is here to discuss test results.  In the interim he called for worsening cough.  We gave him prednisone.  He tells me the prednisone actually made the cough worse particularly towards the end.  After that the son  doubled up his PPI and also added H2 blockade and the cough went away completely.  Patient feels completely normal that he is possible that he has interstitial lung disease.  We reviewed the scan and I visualized it with him together and his son.  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  They wanted to discuss the implications of the scan in the setting of controlled acid reflux and controlled cough but he is largely asymptomatic at this point.  Noted his serologies were negative except for trace ANA and his overnight oxygen study was normal.     OV 01/17/2020   Subjective:  [name], male , DOB: 1940-11-28, age 79 y.o. years. , MRN: [id number],  ADDRESS: [place] [address] PCP  Gaynelle Arabian, MD Providers : Treatment Team:  Attending Provider: Brand Males, MD   Chief Complaint  Patient presents with   Follow-up    pt is here to go over ct and pft    Follow-up interstitial lung disease Follow-up  associated cough partially controlled with acid reflux treatment Associated weight loss present   HPI Blong G Frieze 82 y.o. -presents with his son Jeff Willis.  In the interim he is continue to lose weight.  He has lost another 3-4 pounds since June 2021.  His clothes are much looser.  He thinks he has been steadily losing weight.  His respiratory symptoms are not any worse although he says that his cough that seem to initially improve with doubling up of acid reflux therapy seems to persist.  At last  visit in September 2020 when I thought the cough resolved but they tell me the cough only partially improved.  Nevertheless it is persisting and they are worried about it.  In addition he is worried about weight loss.  He also has poor appetite.  He is not in any pulmonary fibrosis antifibrotic's.  There is no diarrhea.  Symptom score suggest some element of anxiety since his last visit.  He has had pulmonary function test and it shows his FVC is actually better but his DLCO is actually worse.  He had high-resolution CT chest that I visualized and reviewed with him.  It shows probable UIP with possible emerging honeycombing.  I agree with the radiologist.  The radiologist actually feels that it might be a little bit worse compared to few months ago.  Of note: He has upcoming trip to [place] between March 05, 2020 and February 2022.  He wants to make sure his care is coordinated.  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 Evaluate for pulmonary fibrosis.   EXAM: CT CHEST WITHOUT CONTRAST   TECHNIQUE: Multidetector CT imaging of the chest was performed following the standard protocol without intravenous contrast. High resolution imaging of the lungs, as well as inspiratory and expiratory imaging, was performed.   COMPARISON:  Chest CT 09/15/2019.   FINDINGS: Cardiovascular: Heart size is normal. There is no significant pericardial fluid, thickening or pericardial calcification. There is aortic atherosclerosis, as well as atherosclerosis of the great vessels of the mediastinum and the coronary arteries, including calcified atherosclerotic plaque in the left main, left anterior descending and right coronary arteries. Dilatation of the ascending thoracic aorta measuring 4.5 cm in diameter.   Mediastinum/Nodes: No pathologically enlarged mediastinal or  hilar lymph nodes. Please note that accurate exclusion of hilar adenopathy is limited on noncontrast CT scans. Esophagus is unremarkable in appearance. No axillary lymphadenopathy.   Lungs/Pleura: High-resolution images demonstrate widespread areas of ground-glass attenuation, septal thickening, thickening of the peribronchovascular interstitium, mild cylindrical bronchiectasis and peripheral bronchiolectasis. There is a suggestion of, but not definitive evidence of, early honeycombing in portions of the lungs. These findings do have a definitive craniocaudal gradient and appear slightly progressive compared to the prior study. Inspiratory and expiratory imaging is unremarkable. No acute consolidative airspace disease. No pleural effusions. No definite suspicious appearing pulmonary nodules or masses are noted.   Upper Abdomen: Cortical calcification in the upper pole of the right kidney incidentally noted.   Musculoskeletal: There are no aggressive appearing lytic or blastic lesions noted in the visualized portions of the skeleton.   IMPRESSION: 1. The appearance of the lungs is compatible with progressive interstitial lung disease categorized as probable usual interstitial pneumonia (UIP) per current ATS guidelines. 2. Aortic atherosclerosis, in addition to left main and 2 vessel coronary artery disease. In addition,  there is mild aneurysmal dilatation of the ascending thoracic aorta (4.5 cm in diameter). Ascending thoracic aortic aneurysm. Recommend semi-annual imaging followup by CTA or MRA and referral to cardiothoracic surgery if not already obtained. This recommendation follows 2010 ACCF/AHA/AATS/ACR/ASA/SCA/SCAI/SIR/STS/SVM Guidelines for the Diagnosis and Management of Patients With Thoracic Aortic Disease. Circulation. 2010; [id number]. Aortic aneurysm NOS (ICD10-I71.9).   Aortic Atherosclerosis (ICD10-I70.0). Aortic aneurysm NOS (ICD10-I71.9).      Electronically Signed   By: Vinnie Langton M.D.   On: 01/13/2020 15:15    ROS - per HPI    OV 03/01/2020  Subjective:  [name], male , DOB: January 21, 1941 , age 27 y.o. , MRN: [id number] , ADDRESS: [address] [place] [address] PCP Gaynelle Arabian, MD Patient Care Team: Gaynelle Arabian, MD as PCP - General (Family Medicine)  This Provider for this visit: Treatment Team:  Attending Provider: Brand Males, MD  FU IPF Associated weight loss + pre-ofev   03/01/2020 -   Chief Complaint  Patient presents with   Follow-up    ILD, doing ok, cough has not improved     HPI Tiberius G Ley 82 y.o. -returns for follow-up.  This is after starting nintedanib.  He is currently on 100 mg twice daily for 2 weeks.  His prescription is approved on February 02, 2020.  He accidentally got 150 mg twice daily but he sent this back.  He is going to send [place] back.  He has upcoming travel to [place] later this week he will be gone there for till February 2022.  He has supply and refill of his nintedanib on 06 March 2020.  After that he does not have nintedanib.  He and his son report that somebody will be able to go to [place] and deliver it to him.  They do not want to trust the courier service to take it there the supply chain issues.  At this point time from a respiratory standpoint he has minimal dyspnea.  He still has a cough.  The cough is not worse but after reducing the PPI dose the cough has come back.  It is somewhat troublesome.  The severity score is listed below.  They report the GI Dr. Collene Willis has reported concern about taking very high-dose of PPI and H2 blockade which actually helped the cough.  Is because of osteoporosis risk.  I agreed with that concern.  However patient is also reporting significant symptom burden.  Primary care has suggested that they follow the symptom control and quality of life issues.  I did support the primary care viewpoint as well.   Did explain to them at the end of the day as a risk-benefit ratio and with an advanced disease like pulmonary fibrosis 1 might have to consider taking a short-term again with cough control at the expense of long-term risk of side effects.  They are going to think about this.  In terms of his travel to [place].  He put me in touch with Dr. Posey Pronto.  I texted him and establish contact.  He will follow with Dr. Posey Pronto who is a pulmonologist trained in [place] but now living in [place].  Gave other travel advice are listed below.  In terms of weight loss: This is ongoing.  Even after starting the Ofev is not having any GI side effects but his weight loss continues.  He had a CT abdomen that is not showing any reason for weight loss.  Apparently had some hematuria had  cystoscopy with Dr. Junious Silk.  He has some radiation seed implants.  There is no bladder cancer.  The really frustrated by this.  I did pull out an article about IPF and weight loss for the son to read.- shows IPF weight loss is asssocited with worse progrnosis    ROS - per HPI    OV 07/24/2020  Subjective:  [name], male , DOB: 1940/11/05 , age 5 y.o. , MRN: [id number] , ADDRESS: [address] [place] [address] PCP Gaynelle Arabian, MD Patient Care Team: Gaynelle Arabian, MD as PCP - General (Family Medicine)  This Provider for this visit: Treatment Team:  Attending Provider: Brand Males, MD    07/24/2020 -   Chief Complaint  Patient presents with   Follow-up    4 mo f/u. States his breathing has been stable since last visit. Has noticed a decrease in his appetite. Increased diarrhea for the past week.    Idiopathic pulmonary fibrosis on nintedanib 100 mg twice daily -last CT September 2021 Weight loss unintentional even before nintedanib  HPI Jeff Willis 82 y.o. -returns for follow-up.  He was in [place] and returned back in early March 2022.  He is now here with his wife.  He tells me while  he was in [place] for 4 months he had a great time.  He barely had any diarrhea.  Approximately 2 months ago which is 1 month before his return he switched to an [place] version of nintedanib called NINTIB.  He was taking it for a month in [place] and did not have any problems.  Upon returning to the [place] he is starting to have diarrhea.  The diarrhea is ongoing and is 3 out of 5.  Happens 2 or 3 times a week and controlled by Imodium.  His son who is a Software engineer thinks it is because he is in the [place] version of nintedanib.  He says he spoke to the [place] pulmonologist and was told that exponentially there is no difference in diarrhea between the [place] version of nintedanib and the [place] version of nintedanib.  Diarrhea happens randomly does bother him.  He says his diet has not changed since returning to the [place].  He says he eats the same Oblong.  He does take sugar with tea in the morning.  In terms of his tachycardia it still is ongoing.  He is never had echocardiogram.  In terms of weight loss he continues to lose weight.  This weight loss started even before he was on nintedanib.  It is still ongoing.  He is lost 10 pounds since June 2021.  He is worried about this.  PFT  OV 09/22/2020  Subjective:  [name], male , DOB: 20-May-1940 , age 54 y.o. , MRN: [id number] , ADDRESS: [address] [place] [address] PCP Gaynelle Arabian, MD Patient Care Team: Gaynelle Arabian, MD as PCP - General (Family Medicine)  This Provider for this visit: Treatment Team:  Attending Provider: Brand Males, MD    09/22/2020 -   Chief Complaint  Patient presents with   Follow-up    2 mo f/u after PFT. States he is still struggling with diarrhea and losing weight. States his breathing has been stable since last visit.    Idiopathic pulmonary fibrosis on nintedanib 100 mg twice daily since Oct 2021   -last CT September 2021 Weight loss unintentional  even before nintedanib Cough Tachycardia NOS  HPI  Jeff Willis 82 y.o. -returns for follow-up of all the above medical issues.  From a respiratory standpoint he continues to be stable.  He had pulmonary function test that shows continued stability/improvement.  His cough continues.  He in fact he tells me that for the last 2 or 3 years mostly when he drinks water or eats food he starts choking and coughing and clears his throat.  He has now seen ENT and the dysphagia evaluation is set up.  He is continue to lose weight.  In fact he is lost 10 pounds of weight since starting nintedanib.  The weight loss was the even before nintedanib but has accelerated after nintedanib.  He has diarrhea with the nintedanib.  This is on low-dose nintedanib.  Currently is on the [place] version of the nintedanib but he still losing weight and having the same GI symptoms.  In terms of his tachycardia he had echocardiogram and this is normal.  He says his wife is very concerned about his weight loss.  His pants are loose now.      ECHO MAY 2022   IMPRESSIONS     1. Left ventricular ejection fraction, by estimation, is 60 to 65%. The  left ventricle has normal function. The left ventricle has no regional  wall motion abnormalities. Left ventricular diastolic parameters are  indeterminate.   2. Right ventricular systolic function is normal. The right ventricular  size is normal. There is mildly elevated pulmonary artery systolic  pressure. The estimated right ventricular systolic pressure is [id number] mmHg.   3. The mitral valve is normal in structure. No evidence of mitral valve  regurgitation.   4. Tricuspid valve regurgitation is mild to moderate.   5. The aortic valve is tricuspid. Aortic valve regurgitation is not  visualized. No aortic stenosis is present.   6. The inferior vena cava is normal in size with greater than 50%  respiratory variability, suggesting right atrial pressure of 3 mmHg.      OV 01/11/2021  Subjective:  [name], male , DOB: 28-Nov-1940 , age 59 y.o. , MRN: [id number] , ADDRESS: [place] [id number] PCP Gaynelle Arabian, MD Patient Care Team: Gaynelle Arabian, MD as PCP - General (Family Medicine)  This Provider for this visit: Treatment Team:  Attending Provider: Brand Males, MD Type of visit: Telephone/Video Circumstance: COVID-19 national emergency Identification of patient LAITHEN ADIE with [birth year] and MRN [id number] - 2 person identifier Risks: Risks, benefits, limitations of telephone visit explained. Patient understood and verbalized agreement to proceed Anyone else on call: no one other than patient and MD Patient location: patient home This provider location: [address]; [place], [place], [place]   01/11/2021 -  IPF followup   Idiopathic pulmonary fibrosis on nintedanib 100 mg twice daily since Oct 2021   -last CT September 2021 Weight loss unintentional even before nintedanib Cough Tachycardia NOS  HPI Jeff Willis 82 y.o. - off ofev since may 2022 Gained weight 5#/ Minimal dyspnea to no dyspnea even with exertion. [place] in interim and had good time. Did water slide. Cough is a bit worse. Othewrise well. Enegery level goodl Though cough is mild - wants something for cough relief thought says medicine from [place] helping.  Plans to go to [place] on February 21, 2021    OV 02/06/2021  Subjective:  Patient ID: ARNEY DRACE, male , DOB: [birth year] , age 38 y.o. , MRN: [id number] , ADDRESS:  [address] [place] [id number] PCP Gaynelle Arabian, MD Patient Care Team: Gaynelle Arabian, MD as PCP - General (Family Medicine)  This Provider for this visit: Treatment Team:  Attending Provider: Brand Males, MD    02/06/2021 -   Chief Complaint  Patient presents with   Follow-up    Esbriet follow up, cough in the morning    Idiopathic  pulmonary fibrosis on nintedanib 100 mg twice daily since Oct 2021   -last CT September 2022, pft oct 2022 -> progression on CT scan  -Stop nintedanib September 2022 due to weight loss and intolerance  -Started pirfenidone October 2022 Weight loss unintentional even before nintedanib  -Improved September/October 2022 after stopping nintedanib Cough and Dysphagia  -Normal endoscopy with Dr. Collene Willis September 2021/October 2021  -Abnormal barium study September 2022  HPI Jeff G Kreamer 82 y.o. -returns for follow-up.  This is a head of his trip to [place] February 21, 2021.  He will only be back after 6 months in March /April 2023.  He tells me overall he stable.  His symptom score is stable.  Very little to no dyspnea.  He continues to have dysphagia and his chronic cough.  A year ago his endoscopy was normal.  He saw ENT because of his cough they did a barium study.  As barium study was done end of September 2022 and the suggestion of cervical web on the barium study.  He asked me to call Dr. Collene Willis his GI.  I called her.  She said she will work him in.  He might need another endoscopy to rule out cervical web.  We discussed cervical web with the help of Google images.  Currently he is only bothered by cough especially early in the morning but this cough is stable as can be seen in the symptom score.  There is no dyspnea.  He came back from [place].  He plans to go to [place] February 21, 2021 and will stay there through March April 2023.  He is agreed for a telephone visit while in [place].  He has a pulmonologist in [place].  His walking desaturation test is stable except for mild tachycardia.  No shortness of breath.  He says he had liver function test through his primary care and this was normal.  He does not want to be checked today.    OV 09/07/2021  Subjective:  [name], male , DOB: [birth year] , age 38 y.o. , MRN: [id number] , ADDRESS: [place]  [id number] PCP Gaynelle Arabian, MD Patient Care Team: Gaynelle Arabian, MD as PCP - General (Family Medicine)  This Provider for this visit: Treatment Team:  Attending Provider: Brand Males, MD    09/07/2021 -   Chief Complaint  Patient presents with   Follow-up    PFT performed today.      HPI [place] 82 y.o. -returns for follow-up.  Last seen in October 2022.  Then in November 2023 went to [place] to [place].  He returned in March 2023.  While in [place] he says overall he did not have any problems.  He was continuing his pirfenidone.  However he lost his weight.  He did then pause his pirfenidone for 3 weeks and his weight improved.  Then after coming here he started losing weight again.  He could not figure out if it is because of dysphagia which had also started versus pirfenidone.  He then has reduced his pirfenidone to 2  pills 3 times daily.  At this point in time is not interested in clinical trials.  He says he wants to avoid many medications.  Simultaneously has been dealing with dysphagia particular with solids and spices.  Also hard food.  He is clearing his throat quite frequently although he still rates it as level 1 out of 5.  While in [place] May 04, 2021 he had PET scan.  He showed me the images of the PET scan.  We can upload the CD-ROM in the PACS system.  In the entire left lower lobe there is slightly increased uptake for 2.7.  This is consistent with the fibrotic tissue there is also low uptake in the mediastinal adenopathy which is considered to be reactive.  He did not want any quick follow-up of his CT scan.  He is okay to have a CT scan in September 2023.  He wants avoid radiation exposure.   On August 10, 2021 at [facility] long Dr. Carlean Jews performed status post dilatation of esophageal stenosis.  I reviewed the records.  Of note: He plans to go to [place] in October 2023.  He wants his next follow-up in September 23.  In August 2023 is going with a lot of  people to [place]       PFT      [place] 02/25/2022  Subjective:  [name], male , DOB: 05-30-1940 , age 33 y.o. , MRN: [id number] , ADDRESS: [place] [id number] PCP Gaynelle Arabian, MD Patient Care Team: Gaynelle Arabian, MD as PCP - General (Family Medicine)  This Provider for this visit: Treatment Team:  Attending Provider: Brand Males, MD    02/25/2022 -   Chief Complaint  Patient presents with   Follow-up    PFT performed today.  Pt states he is slowly getting better after recent hospital stay.    HPI [place] 82 y.o. -returns for follow-up.  Over the summer 2023 got hospitalized for COVID-19 related respiratory failure and IPF flareup and ARDS.  He then had a complication by aspiration for which she did not get intubated according to the son.  He then spent time in inpatient rehab and is now home for the last 3 weeks.  At home he is continue to improve.  He is on a chronic prednisone taper with Bactrim.  At this point in time he is not needing oxygen at rest based on home pulse oximetry monitoring.  Here we walked him 5 feet and back on room air and he did not desaturate below 94%.  Recent liver function test normal.  Anemia also improving.   He has had continued weight loss although he has gained weight since the hospitalization compared to prehospitalization this further weight loss to 114 pounds.  Some of this was pirfenidone mediated.  He is now back on the pirfenidone for the last few weeks.  They also feel that some of the weight loss is because he is not able to take enough food because of the dysphagia and the cough.  He continues to be physically deconditioned but even that he has improved.  He is able to do some ADLs.  He is able to take shower with the help of his wife.  He is able to walk within the house although still a little bit wobbly   He now has a chronic Foley which she is not able to get rid of  after the hospitalization.  He follows with Dr.  [place] neurology.   The main issue right now is his cough and clearing of the throat.  The 2 are slightly unrelated.  He coughs when he eats food.  He also coughs when he clears her throat.  He throats clears his throat independent of the cough.  Eating food makes him clear the throat more. .  At night he does not clear the throat.  He has not seen ENT in 2 years.  There is no history of difficult intubation.  He and her son feel it is related to his dysphagia issues.  Mid November 2023 he is being scheduled for esophageal dilatation by Dr. Carol Ada.  I did tell them that the risk for endoscopy is acceptable given the fact he was able to walk 5-10 feet without any desaturation on room air ..  I supported him having the endoscopy.  They believe there might be some sinus drainage.  Did encourage him to try over-the-counter Flonase and also Mucinex.   He did have some back pain in the hospital and was started on gabapentin.  Is clearly not helping his cough or clearing of the throat.  He is back pain is not much of an issue.  We took a shared decision making he can come off this.      04/01/2022 Patient presents today for follow-up.  She has a history of IPF recently diagnosed with community-acquired pneumonia.  Chest x-ray on 03/20/2022 showed increased left lung opacity.  He was treated with liquid Augmentin x 7 days. Cough is worse the last several days. Son reports that he has several distince coughs. No associated shortness of breath. He has new swelling in feet last 4-5 days. Constantly clearing his throat, aggressively trys to cough up mucus but cough is mainly dry. When he lays flat he does not cough.  He is compliant with Esbriet and taking prednisone every other day. No increased oxygen demands. During last office visit with Dr. Chase Caller he was referred to ENT due to cough to rule out postnasal drip as cause. He has not been contacted to  schedule visit.  He had an EGD in November which showed esophageal stenosis.  Takes Protonix twice daily, needs refill.       OV 06/20/2022  Subjective:  [name], male , DOB: Mar 15, 1941 , age 5 y.o. , MRN: [id number] , ADDRESS: [place] [id number] PCP Gaynelle Arabian, MD Patient Care Team: Gaynelle Arabian, MD as PCP - General (Family Medicine) Troy Sine, MD as PCP - Cardiology (Cardiology)  This Provider for this visit: Treatment Team:  Attending Provider: Brand Males, MD   Idiopathic pulmonary fibrosis on nintedanib 100 mg twice daily since Oct 2021   -last CT September 2022, pft oct 2022 -> progression on CT scan  -Stop nintedanib September 2022 due to weight loss and intolerance  -Started pirfenidone October 2022 -> reduced to 2 pills 3 times daily March 2023 due to weight loss  IPF flareup due to COVID-19 summer 2023 followed by aspiration pneumonia and acute respiratory failure needing oxygen and intubation  -History of intubation  - Course complicated by aspiration  -On prednisone therapy since discharge  Weight loss unintentional even before nintedanib  -Improved September/October 2022 after stopping nintedanib  0-relapse while in [place] but after starting pirfenidone in the fall 2022 and early 2023.  -Progressive weight loss after summer 2023 hospitalization for COVID-19  - June 2021: 137#  - 114# - oct 2023  -  110# 06/20/2022   Cough and Dysphagia ad clearing throat  -Normal endoscopy with Dr. Collene Willis September 2021/October 2021  -Abnormal barium study September 2022  -Status post endoscopy with esophageal stenosis dilatation by Dr. Benson Norway April 2023.  -Worse in October 2023 after some hospitalization for COVID   06/20/2022 -visit after exposure to flulike illness.   HPI [place] 82 y.o. -presents with son benign.  This is an acute visit.  He has full schedule regular visit coming up.  Some 3-4 days  ago son thought he had the flu.  Initially said he had the flu but later said he did not test himself for the flu but he was flulike illness.  The daughter-in-law who also lives in the house got similar symptoms then 2 days ago patient started with body ache and cough and wet cough that is increased compared to baseline.  The sputum was clear in color and there is no fever or chills.  There is no runny nose.  Currently he is improving actually.  His cough intensity has come down his body ache is come down.  He is to do pulmonary rehabilitation and is not desaturating but he still uses his oxygen for psychological comfort.  He did tell me that when he goes upstairs he goes down to 88-89% but this is baseline.  He does not think he is desaturating currently.  We did a rapid flu test and it was negative.  He is continue to lose weight.  He had endoscopy in November 2023 for esophageal stenosis.  Frequent scheduled endoscopy at [facility] is pending by Dr. Carol Ada.  He is continue to lose weight.     SYMPTOM SCALE - ILD 10/05/2019 137 pounds 01/17/2020 134 pounds 03/01/2020 131# - start ofev oct 2021 07/24/2020 127# on ofev (nintib from [place] by CIPLA x 2 months) 139m bid 09/22/2020 122# - on ofev 1064mbid 01/11/2021 Tele visit - off ofev  128# not on any antifibrotic 02/06/2021 131# -esbriet x 1 week 09/07/2021 125#  02/25/2022 114#  06/20/2022 110#  O2 use ra ra ra ra ra  ra    Shortness of Breath 0 -> 5 scale with 5 being worst (score 6 If unable to do)          At rest 0 0 0 0 0  0    Simple tasks - showers, clothes change, eating, shaving 0 0 0 1 0  0 0   Household (dishes, doing bed, laundry) 0 1 1  0 0  0 1.5   Shopping 0 0 0 0   0 nan   Walking level at own pace 0 0 0 0 0  0 1   Walking up Stairs 1 0 1 0 0  0 2   Total (30-36) Dyspnea Score 1 1 2 1 $ 0  0 3.5   How bad is your cough? 2 3 0 2 2  2 5   $ How bad is your fatigue 0 0 0 0 0  0 4   How bad is nausea 0 0 0 1 0  0 0    How bad is vomiting?  00 0 0 0 0  0 0   How bad is diarrhea? 0 0 0 3 3  0 0   How bad is anxiety? 00 1 1 1 $ 0  1 1.5   How bad is depression 0 1 1 1 $ 0  1 1.5  Simple office walk 185 feet x  3 laps goal with forehead probe 10/05/2019  03/01/2020  07/24/2020  09/22/2020  02/06/2021  09/07/2021   O2 used ra ra  ra ra ra  Number laps completed 3   3    Comments about pace avt   Mod paced avg avg  Resting Pulse Ox/HR 100% and 122/min 98% and 110 1-00% and 120 100% and 109/min 99% and 89 100% and 85  Final Pulse Ox/HR 100% and 143/min 97% and 130 99% and 130 99% and 124/min 99% and 114 98% ad 115  Desaturated </= 88% x x  no no   Desaturated <= 3% points x x  no no   Got Tachycardic >/= 90/min x x  ye yes   Symptoms at end of test x x  none none   Miscellaneous comments x x No dyspnea No dyspnea No dyspnea No dyspnea    PFT     Latest Ref Rng & Units 02/25/2022    3:12 PM 09/07/2021   10:48 AM 02/05/2021   10:44 AM 09/22/2020    9:50 AM 01/17/2020   10:13 AM 10/06/2019    1:49 PM  PFT Results  FVC-Pre L 1.96  2.63  2.67  2.81   2.59   FVC-Predicted Pre % 63  84  85  89  83  78   Pre FEV1/FVC % % 89  85  87  87  86  86   FEV1-Pre L 1.75  2.24  2.33  2.45  2.37  2.24   FEV1-Predicted Pre % 74  95  97  102  95  89   DLCO uncorrected ml/min/mmHg 8.46  12.24  13.45  16.91  13.79  15.70   DLCO UNC% % 31  45  49  62  48  55   DLCO corrected ml/min/mmHg 9.22  12.24  13.45  16.91  13.79  15.70   DLCO COR %Predicted % 34  45  49  62  48  55   DLVA Predicted % 68  72  80  89  81  97        has a past medical history of Back pain, Elevated PSA, Essential tremor, Hypercholesteremia, Neuropathy, Prostate cancer ([place]), and Sinus tachycardia (01/30/2018).   reports that he has never smoked. He has never used smokeless tobacco.  Past Surgical History:  Procedure Laterality Date   COLONOSCOPY WITH PROPOFOL N/A 08/10/2021   Procedure: COLONOSCOPY WITH PROPOFOL;  Surgeon: Carol Ada, MD;  Location: [facility];  Service: Gastroenterology;  Laterality: N/A;   ESOPHAGEAL DILATION  08/10/2021   Procedure: ESOPHAGEAL DILATION;  Surgeon: Carol Ada, MD;  Location: [facility];  Service: Gastroenterology;;   ESOPHAGOGASTRODUODENOSCOPY (EGD) WITH PROPOFOL N/A 08/10/2021   Procedure: ESOPHAGOGASTRODUODENOSCOPY (EGD) WITH PROPOFOL;  Surgeon: Carol Ada, MD;  Location: [facility];  Service: Gastroenterology;  Laterality: N/A;   ESOPHAGOGASTRODUODENOSCOPY (EGD) WITH PROPOFOL N/A 03/12/2022   Procedure: ESOPHAGOGASTRODUODENOSCOPY (EGD) WITH PROPOFOL;  Surgeon: Carol Ada, MD;  Location: [facility];  Service: Gastroenterology;  Laterality: N/A;   HEMOSTASIS CLIP PLACEMENT  08/10/2021   Procedure: HEMOSTASIS CLIP PLACEMENT;  Surgeon: Carol Ada, MD;  Location: [facility];  Service: Gastroenterology;;   LUMBAR MICRODISCECTOMY     L4-5   POLYPECTOMY  08/10/2021   Procedure: POLYPECTOMY;  Surgeon: Carol Ada, MD;  Location: [facility];  Service: Gastroenterology;;   RADIOACTIVE SEED IMPLANT N/A 02/24/2018   Procedure: RADIOACTIVE SEED IMPLANT/BRACHYTHERAPY IMPLANT;  Surgeon: Festus Aloe, MD;  Location: [facility];  Service: Urology;  Laterality: N/A;   RIGHT HEART CATH N/A 11/22/2021   Procedure: RIGHT HEART CATH;  Surgeon: Troy Sine, MD;  Location: [facility];  Service: Cardiovascular;  Laterality: N/A;   SAVORY DILATION N/A 03/12/2022   Procedure: SAVORY DILATION;  Surgeon: Carol Ada, MD;  Location: [facility];  Service: Gastroenterology;  Laterality: N/A;   SPACE OAR INSTILLATION N/A 02/24/2018   Procedure: SPACE OAR INSTILLATION;  Surgeon: Festus Aloe, MD;  Location: [facility];  Service: Urology;  Laterality: N/A;    Allergies  Allergen Reactions   Beef-Derived Products Other (See Comments)    Vegetarian   Chicken Protein Other (See Comments)    Vegetarian   Eggs Or Egg-Derived Products  Other (See Comments)    Vegetarian   Fish-Derived Products Other (See Comments)    Vegetarian   Meat Extract Other (See Comments)    Vegetarian   Ofev [Nintedanib] Other (See Comments)    Weight loss   Pork-Derived Products Other (See Comments)    Vegetarian    Immunization History  Administered Date(s) Administered   Influenza, High Dose Seasonal PF 01/28/2019, 01/12/2021, 02/11/2022   Influenza-Unspecified 01/05/2020   PFIZER(Purple Top)SARS-COV-2 Vaccination 07/23/2019, 08/17/2019   Pneumococcal-Unspecified 02/28/2015   Zoster, Live 01/26/2021    Family History  Problem Relation Age of Onset   Other Mother        died at 51 of natural causes   Other Father        unsure of health   Cancer Neg Hx      Current Outpatient Medications:    aspirin EC 81 MG tablet, Take 81 mg by mouth daily. Swallow whole., Disp: , Rfl:    chlorpheniramine-HYDROcodone (TUSSIONEX) 10-8 MG/5ML, Take 5 mLs by mouth every 12 (twelve) hours as needed for cough., Disp: 300 mL, Rfl: 0   furosemide (LASIX) 20 MG tablet, Take 1 tablet (20 mg total) by mouth daily as needed for edema., Disp: 30 tablet, Rfl: 1   melatonin 3 MG TABS tablet, Take 1 tablet (3 mg total) by mouth at bedtime as needed., Disp: , Rfl: 0   metoprolol succinate (TOPROL-XL) 50 MG 24 hr tablet, Take 1.5 tablets (75 mg total) by mouth daily. Take with or immediately following a meal., Disp: 135 tablet, Rfl: 3   Multiple Vitamin (MULTIVITAMIN WITH MINERALS) TABS tablet, Take 1 tablet by mouth daily., Disp: , Rfl:    pantoprazole (PROTONIX) 40 MG tablet, Take 1 tablet (40 mg total) by mouth 2 (two) times daily., Disp: 30 tablet, Rfl: 2   Pirfenidone 267 MG CAPS, Take 534 mg by mouth in the morning, at noon, and at bedtime., Disp: , Rfl:    rosuvastatin (CRESTOR) 10 MG tablet, TAKE ONE TABLET BY MOUTH DAILY, Disp: 90 tablet, Rfl: 3   Vitamin D, Ergocalciferol, (DRISDOL) 1.25 MG (50000 UNIT) CAPS capsule, Take 1 capsule (50,000 Units  total) by mouth every 7 (seven) days., Disp: 5 capsule, Rfl: 0      Objective:   Vitals:   06/20/22 0917  BP: 124/62  Pulse: (!) 116  Temp: (!) 97.4 F (36.3 C)  TempSrc: Oral  SpO2: 96%  Weight: 110 lb 12.8 oz (50.3 kg)  Height: 5' 6"$  (1.676 m)    Estimated body mass index is 17.88 kg/m as calculated from the following:   Height as of this encounter: 5' 6"$  (1.676 m).   Weight as of this encounter: 110 lb 12.8  oz (50.3 kg).  @WEIGHTCHANGE$ @  [employer]   06/20/22 0917  Weight: 110 lb 12.8 oz (50.3 kg)     Physical Exam  General: No distress.  Thin and he looks stable. Neuro: Alert and Oriented x 3. GCS 15. Speech normal Psych: Pleasant Resp:  Barrel Chest - no.  Wheeze - no, Crackles - YES, No overt respiratory distress CVS: Normal heart sounds. Murmurs - no Ext: Stigmata of Connective Tissue Disease - no HEENT: Normal upper airway. PEERL +. No post nasal drip        Assessment:       ICD-10-CM   1. Acute bronchitis, unspecified organism  J20.9          Plan:     Patient Instructions     ICD-10-CM   1. Acute bronchitis, unspecified organism  J20.9       Likely flu like illness Resolving on own Flu test here in office is negative  Plan  - rest, hydrate  - monitor o2 levels and symptoms - if worse, then call for antibiotics and steroids  Followup  - keep regular office visit coming up  - will test for o2 levels etc at that visit    SIGNATURE    Dr. Brand Males, M.D., F.C.C.P,  Pulmonary and Critical Care Medicine Staff Physician, Advance Director - Interstitial Lung Disease  Program  Pulmonary [place] [facility] [place], [place], [address]  Pager: [phone], If no answer or between  15:00h - 7:00h: call [phone] Telephone: [phone]  9:42 AM 06/20/2022

## 2022-06-20 NOTE — Patient Instructions (Addendum)
ICD-10-CM   1. Acute bronchitis, unspecified organism  J20.9       Likely flu like illness Resolving on own Flu test here in office is negative  Plan  - rest, hydrate  - monitor o2 levels and symptoms - if worse, then call for antibiotics and steroids - contnue esbriet for IPF  Followup  - keep regular office visit coming up  - will test for o2 levels etc at that visit

## 2022-06-20 NOTE — Progress Notes (Signed)
Daily Session Note  Patient Details  Name: Jeff Willis MRN: [id number] Date of Birth: 1940-08-08 Referring Provider:   April Manson Pulmonary Rehab Walk Test from 04/15/2022 in [facility] [place]  Referring Provider [name]       Encounter Date: 06/20/2022  Check In:  Session Check In - 06/20/22 1445       Check-In   Supervising physician immediately available to respond to emergencies [facility] MD immediately available    Physician(s) Shalhoub    Location [facility]    Staff Present Elmon Else, MS, ACSM-CEP, Exercise Physiologist;Manjot Hinks Yevonne Pax, ACSM-CEP, Exercise Physiologist;Samantha [place], [place], LDN;Other;Mary Margette Fast, RN, BSN    Virtual Visit No    Medication changes reported     No    Fall or balance concerns reported    No    Tobacco Cessation No Change    Warm-up and Cool-down Performed as group-led Higher education careers adviser Performed Yes    VAD Patient? No    PAD/SET Patient? No      Pain Assessment   Currently in Pain? No/denies    Multiple Pain Sites No             Capillary Blood Glucose: No results found for this or any previous visit (from the past 24 hour(s)).    Social History   Tobacco Use  Smoking Status Never  Smokeless Tobacco Never    Goals Met:  Proper associated with RPD/PD & O2 Sat Improved SOB with ADL's Exercise tolerated well Personal goals reviewed No report of concerns or symptoms today  Goals Unmet:  Not Applicable  Comments: Pt completed 6 min walk test, improving his distance by 105%. He graduated today. Service time is from 1215 to 1240.    Dr. Rodman Pickle is Medical Director for Pulmonary Rehab at [facility]

## 2022-06-20 NOTE — Progress Notes (Signed)
Daily Session Note  Patient Details  Name: Jeff Willis MRN: [id number] Date of Birth: 11/03/1940 Referring Provider:   April Manson Pulmonary Rehab Walk Test from 04/15/2022 in [facility] [place]  Referring Provider [name]       Encounter Date: 06/20/2022  Check In:  Session Check In - 06/20/22 1445       Check-In   Supervising physician immediately available to respond to emergencies [facility] MD immediately available    Physician(s) Shalhoub    Location [facility]    Staff Present Elmon Else, MS, ACSM-CEP, Exercise Physiologist;Randi Yevonne Pax, ACSM-CEP, Exercise Physiologist;Samantha [place], [place], LDN;Other;Mary Margette Fast, RN, BSN    Virtual Visit No    Medication changes reported     No    Fall or balance concerns reported    No    Tobacco Cessation No Change    Warm-up and Cool-down Performed as group-led Higher education careers adviser Performed Yes    VAD Patient? No    PAD/SET Patient? No      Pain Assessment   Currently in Pain? No/denies    Multiple Pain Sites No             Capillary Blood Glucose: No results found for this or any previous visit (from the past 24 hour(s)).    Social History   Tobacco Use  Smoking Status Never  Smokeless Tobacco Never    Goals Met:  Proper associated with RPD/PD & O2 Sat Exercise tolerated well No report of concerns or symptoms today  Goals Unmet:  Not Applicable  Comments: Service time is from 1325 to 1441.    Dr. Rodman Pickle is Medical Director for Pulmonary Rehab at [facility].

## 2022-06-26 ENCOUNTER — Other Ambulatory Visit: Payer: Self-pay | Admitting: Gastroenterology

## 2022-06-26 NOTE — Progress Notes (Signed)
Discharge Progress Report  Patient Details  Name: Jeff Willis MRN: [id number] Date of Birth: [birth year] Referring Provider:   April Manson Pulmonary Rehab Walk Test from 04/15/2022 in [facility] [place]  Referring Provider [name]        Number of Visits: 17  Reason for Discharge:  Patient reached a stable level of exercise. Patient independent in their exercise. Patient has met program and personal goals.  Smoking History:  Social History   Tobacco Use  Smoking Status Never  Smokeless Tobacco Never    Diagnosis:  ILD (interstitial lung disease) ([place])  ADL [facility]:  Pulmonary Assessment Scores     Row Name 04/15/22 1111 06/07/22 1444 06/20/22 1538     ADL [facility]   ADL Phase Entry Entry --   SOB Score total 71 42 --     CAT Score   CAT Score 11 23 --     mMRC Score   mMRC Score 0 0 1            Initial Exercise Prescription:  Initial Exercise Prescription - 04/15/22 1200       Date of Initial Exercise RX and Referring Provider   Date 04/15/22    Referring Provider [name]    Expected Discharge Date 06/20/22      Oxygen   Oxygen Continuous    Liters 1    Maintain Oxygen Saturation 88% or higher      NuStep   Level 1    SPM 50    Minutes 15      Track   Minutes 15    METs 1.44      Prescription Details   Frequency (times per week) 2    Duration Progress to 30 minutes of continuous aerobic without signs/symptoms of physical distress      Intensity   THRR 40-80% of Max Heartrate 56-112    Ratings of Perceived Exertion 11-13    Perceived Dyspnea 0-4      Progression   Progression Continue to progress workloads to maintain intensity without signs/symptoms of physical distress.      Resistance Training   Training Prescription Yes    Weight red bands    Reps 10-15             Discharge Exercise Prescription (Final Exercise Prescription Changes):  Exercise Prescription  Changes - 06/11/22 1500       Response to Exercise   Blood Pressure (Admit) 110/80    Blood Pressure (Exercise) 110/60    Blood Pressure (Exit) 100/58    Heart Rate (Admit) 119 bpm    Heart Rate (Exercise) 131 bpm    Heart Rate (Exit) 125 bpm    Oxygen Saturation (Admit) 97 %    Oxygen Saturation (Exercise) 92 %    Oxygen Saturation (Exit) 95 %    Rating of Perceived Exertion (Exercise) 11    Perceived Dyspnea (Exercise) 1    Duration Continue with 30 min of aerobic exercise without signs/symptoms of physical distress.    Intensity THRR unchanged      Progression   Progression Continue to progress workloads to maintain intensity without signs/symptoms of physical distress.      Resistance Training   Training Prescription Yes    Weight Red Bands    Reps 10-15    Time 10 Minutes      Oxygen   Oxygen --   D/C     NuStep   Level  3    SPM 60    Minutes 15    METs 2.3      Track   Laps 7    Minutes 15    METs 2.08      Oxygen   Maintain Oxygen Saturation 88% or higher             Functional Capacity:  6 Minute Walk     Row Name 04/15/22 1215 06/20/22 1533       6 Minute Walk   Phase Initial Discharge    Distance 535 feet 1100 feet    Distance % Change -- 105.6 %    Distance Feet Change -- 565 ft    Walk Time 6 minutes 6 minutes    # of Rest Breaks 0 0    MPH 1.01 2.08    METS 1.44 2.92    RPE 9 9    Perceived Dyspnea  0 0    VO2 Peak 5.04 10.22    Symptoms No No    Resting HR 76 bpm 111 bpm    Resting BP 124/62 126/80    Resting Oxygen Saturation  97 % 94 %    Exercise Oxygen Saturation  during 6 min walk 90 % 90 %    Max Ex. HR 104 bpm 117 bpm    Max Ex. BP 122/64 130/60    2 Minute Post BP 118/60 150/84      Interval HR   1 Minute HR 89 112    2 Minute HR 92 113    3 Minute HR 94 112    4 Minute HR 94 117    5 Minute HR 104 100    6 Minute HR 96 93    2 Minute Post HR 81 75    Interval Heart Rate? Yes Yes      Interval Oxygen    Interval Oxygen? Yes Yes    Baseline Oxygen Saturation % 97 %  On 1L. Decreased to no O2 94 %    1 Minute Oxygen Saturation % 93 % 94 %    1 Minute Liters of Oxygen 0 L 0.5 L    2 Minute Oxygen Saturation % 92 % 93 %    2 Minute Liters of Oxygen 0 L 0.5 L    3 Minute Oxygen Saturation % 91 % 91 %    3 Minute Liters of Oxygen 0 L 0.5 L    4 Minute Oxygen Saturation % 90 % 90 %    4 Minute Liters of Oxygen 0 L 0.5 L    5 Minute Oxygen Saturation % 90 % 91 %    5 Minute Liters of Oxygen 0 L 0.5 L    6 Minute Oxygen Saturation % 90 % 91 %    6 Minute Liters of Oxygen 0 L 0.5 L    2 Minute Post Oxygen Saturation % 95 % 94 %    2 Minute Post Liters of Oxygen 0 L 0.5 L             Psychological, QOL, Others - Outcomes: PHQ 2/9:    06/13/2022    3:11 PM 04/15/2022   10:56 AM 10/08/2017   10:21 AM  Depression screen PHQ 2/9  Decreased Interest 1 0 0  Down, Depressed, Hopeless 1 0 0  PHQ - 2 Score 2 0 0  Altered sleeping 0 0   Tired, decreased energy 0 0   Change in appetite 0 0  Feeling bad or failure about yourself  0 0   Trouble concentrating 0 0   Moving slowly or fidgety/restless 0 0   Suicidal thoughts 0 0   PHQ-9 Score 2 0   Difficult doing work/chores Not difficult at all Not difficult at all     Quality of Life:   Personal Goals: Goals established at orientation with interventions provided to work toward goal.  Personal Goals and Risk Factors at Admission - 04/15/22 1104       Core Components/Risk Factors/Patient Goals on Admission    Weight Management Weight Gain;Yes    Intervention Weight Management: Develop a combined nutrition and exercise program designed to reach desired caloric intake, while maintaining appropriate intake of nutrient and fiber, sodium and fats, and appropriate energy expenditure required for the weight goal.;Weight Management: Provide education and appropriate resources to help participant work on and attain dietary goals.    Expected  Outcomes Short Term: Continue to assess and modify interventions until short term weight is achieved;Long Term: Adherence to nutrition and physical activity/exercise program aimed toward attainment of established weight goal;Weight Maintenance: Understanding of the daily nutrition guidelines, which includes 25-35% calories from fat, 7% or less cal from saturated fats, less than '200mg'$  cholesterol, less than 1.5gm of sodium, & 5 or more servings of fruits and vegetables daily;Weight Gain: Understanding of general recommendations for a high calorie, high protein meal plan that promotes weight gain by distributing calorie intake throughout the day with the consumption for 4-5 meals, snacks, and/or supplements;Understanding of distribution of calorie intake throughout the day with the consumption of 4-5 meals/snacks;Understanding recommendations for meals to include 15-35% energy as protein, 25-35% energy from fat, 35-60% energy from carbohydrates, less than '200mg'$  of dietary cholesterol, 20-35 gm of total fiber daily    Improve shortness of breath with ADL's Yes    Intervention Provide education, individualized exercise plan and daily activity instruction to help decrease symptoms of SOB with activities of daily living.    Expected Outcomes Short Term: Improve cardiorespiratory fitness to achieve a reduction of symptoms when performing ADLs;Long Term: Be able to perform more ADLs without symptoms or delay the onset of symptoms              Personal Goals Discharge:  Goals and Risk Factor Review     Row Name 04/17/22 1430 05/15/22 1105 06/04/22 0855         Core Components/Risk Factors/Patient Goals Review   Personal Goals Review Other;Improve shortness of breath with ADL's;Develop more efficient breathing techniques such as purse lipped breathing and diaphragmatic breathing and practicing self-pacing with activity.;Increase knowledge of respiratory medications and ability to use respiratory devices  properly.  Weight gain Other;Improve shortness of breath with ADL's;Develop more efficient breathing techniques such as purse lipped breathing and diaphragmatic breathing and practicing self-pacing with activity.;Increase knowledge of respiratory medications and ability to use respiratory devices properly.  Weight gain Other;Improve shortness of breath with ADL's;Develop more efficient breathing techniques such as purse lipped breathing and diaphragmatic breathing and practicing self-pacing with activity.;Increase knowledge of respiratory medications and ability to use respiratory devices properly.  Weight gain     Review Deep has completed PR orientation and is scheduled to start exercising 04/23/22. We will continue to  monitor his core components and address them. Chhit has completed 6 PR classes. He is currently exercising on the NuStep and the Recumbent Bike.   He needs assistance from transferring from 1 piece of equipment to another because of his deconditioning,  unstable gait and trying to maneuver his oxygen tank. Staff also assist him in setting up his exercise equipment. Chhit is making progress and has increased his workload. He has began using pursued lip breathing when short of breath but needs reminders to use it on exertion. Chhit has declined education classes due to his family working and driving him to/from class. We have provided hand outs to Chhit about exercise with the resistance bands, warming up, and cooling down at home. Chhit has completed 11 PR classes. He is currently exercising on the NuStep and walking the track. His mets increased from 1 to 2.3 on the NuStep, but he is maintaining the same laps on the track. He now can transition from one piece of equipment to the other independently. Chitt has increased his work level from 1 to 3 on the NuStep. He is using pursued lip breathing when short of breath but needs reminders to use it on exertion. Chhit has attended oxygen safety and  oxygen use class. Chhit has been working with our dietician to gain weight by drinking to protein shakes daily.     Expected Outcomes See admission goals. See admission goals. See admission goals.              Exercise Goals and Review:  Exercise Goals     Row Name 04/15/22 1117 04/16/22 0934 05/10/22 1017 06/03/22 1137       Exercise Goals   Increase Physical Activity Yes Yes Yes Yes    Intervention Provide advice, education, support and counseling about physical activity/exercise needs.;Develop an individualized exercise prescription for aerobic and resistive training based on initial evaluation findings, risk stratification, comorbidities and participant's personal goals. Provide advice, education, support and counseling about physical activity/exercise needs.;Develop an individualized exercise prescription for aerobic and resistive training based on initial evaluation findings, risk stratification, comorbidities and participant's personal goals. Provide advice, education, support and counseling about physical activity/exercise needs.;Develop an individualized exercise prescription for aerobic and resistive training based on initial evaluation findings, risk stratification, comorbidities and participant's personal goals. Provide advice, education, support and counseling about physical activity/exercise needs.;Develop an individualized exercise prescription for aerobic and resistive training based on initial evaluation findings, risk stratification, comorbidities and participant's personal goals.    Expected Outcomes Short Term: Attend rehab on a regular basis to increase amount of physical activity.;Long Term: Exercising regularly at least 3-5 days a week.;Long Term: Add in home exercise to make exercise part of routine and to increase amount of physical activity. Short Term: Attend rehab on a regular basis to increase amount of physical activity.;Long Term: Exercising regularly at least 3-5  days a week.;Long Term: Add in home exercise to make exercise part of routine and to increase amount of physical activity. Short Term: Attend rehab on a regular basis to increase amount of physical activity.;Long Term: Exercising regularly at least 3-5 days a week.;Long Term: Add in home exercise to make exercise part of routine and to increase amount of physical activity. Short Term: Attend rehab on a regular basis to increase amount of physical activity.;Long Term: Exercising regularly at least 3-5 days a week.;Long Term: Add in home exercise to make exercise part of routine and to increase amount of physical activity.    Increase Strength and Stamina Yes Yes Yes Yes    Intervention Provide advice, education, support and counseling about physical activity/exercise needs.;Develop an individualized exercise prescription for aerobic and resistive training based on initial evaluation findings, risk stratification, comorbidities and participant's personal goals. Provide  advice, education, support and counseling about physical activity/exercise needs.;Develop an individualized exercise prescription for aerobic and resistive training based on initial evaluation findings, risk stratification, comorbidities and participant's personal goals. Provide advice, education, support and counseling about physical activity/exercise needs.;Develop an individualized exercise prescription for aerobic and resistive training based on initial evaluation findings, risk stratification, comorbidities and participant's personal goals. Provide advice, education, support and counseling about physical activity/exercise needs.;Develop an individualized exercise prescription for aerobic and resistive training based on initial evaluation findings, risk stratification, comorbidities and participant's personal goals.    Expected Outcomes Short Term: Increase workloads from initial exercise prescription for resistance, speed, and METs.;Short Term:  Perform resistance training exercises routinely during rehab and add in resistance training at home;Long Term: Improve cardiorespiratory fitness, muscular endurance and strength as measured by increased METs and functional capacity (6MWT) Short Term: Increase workloads from initial exercise prescription for resistance, speed, and METs.;Short Term: Perform resistance training exercises routinely during rehab and add in resistance training at home;Long Term: Improve cardiorespiratory fitness, muscular endurance and strength as measured by increased METs and functional capacity (6MWT) Short Term: Increase workloads from initial exercise prescription for resistance, speed, and METs.;Short Term: Perform resistance training exercises routinely during rehab and add in resistance training at home;Long Term: Improve cardiorespiratory fitness, muscular endurance and strength as measured by increased METs and functional capacity (6MWT) Short Term: Increase workloads from initial exercise prescription for resistance, speed, and METs.;Short Term: Perform resistance training exercises routinely during rehab and add in resistance training at home;Long Term: Improve cardiorespiratory fitness, muscular endurance and strength as measured by increased METs and functional capacity (6MWT)    Able to understand and use rate of perceived exertion (RPE) scale Yes Yes Yes Yes    Intervention Provide education and explanation on how to use RPE scale Provide education and explanation on how to use RPE scale Provide education and explanation on how to use RPE scale Provide education and explanation on how to use RPE scale    Expected Outcomes Short Term: Able to use RPE daily in rehab to express subjective intensity level;Long Term:  Able to use RPE to guide intensity level when exercising independently Short Term: Able to use RPE daily in rehab to express subjective intensity level;Long Term:  Able to use RPE to guide intensity level when  exercising independently Short Term: Able to use RPE daily in rehab to express subjective intensity level;Long Term:  Able to use RPE to guide intensity level when exercising independently Short Term: Able to use RPE daily in rehab to express subjective intensity level;Long Term:  Able to use RPE to guide intensity level when exercising independently    Able to understand and use Dyspnea scale Yes Yes Yes Yes    Intervention Provide education and explanation on how to use Dyspnea scale Provide education and explanation on how to use Dyspnea scale Provide education and explanation on how to use Dyspnea scale Provide education and explanation on how to use Dyspnea scale    Expected Outcomes Short Term: Able to use Dyspnea scale daily in rehab to express subjective sense of shortness of breath during exertion;Long Term: Able to use Dyspnea scale to guide intensity level when exercising independently Short Term: Able to use Dyspnea scale daily in rehab to express subjective sense of shortness of breath during exertion;Long Term: Able to use Dyspnea scale to guide intensity level when exercising independently Short Term: Able to use Dyspnea scale daily in rehab to express subjective sense of shortness of  breath during exertion;Long Term: Able to use Dyspnea scale to guide intensity level when exercising independently Short Term: Able to use Dyspnea scale daily in rehab to express subjective sense of shortness of breath during exertion;Long Term: Able to use Dyspnea scale to guide intensity level when exercising independently    Knowledge and understanding of Target Heart Rate Range (THRR) Yes Yes Yes Yes    Intervention Provide education and explanation of THRR including how the numbers were predicted and where they are located for reference Provide education and explanation of THRR including how the numbers were predicted and where they are located for reference Provide education and explanation of THRR including  how the numbers were predicted and where they are located for reference Provide education and explanation of THRR including how the numbers were predicted and where they are located for reference    Expected Outcomes Short Term: Able to state/look up THRR;Long Term: Able to use THRR to govern intensity when exercising independently;Short Term: Able to use daily as guideline for intensity in rehab Short Term: Able to state/look up THRR;Long Term: Able to use THRR to govern intensity when exercising independently;Short Term: Able to use daily as guideline for intensity in rehab Short Term: Able to state/look up THRR;Long Term: Able to use THRR to govern intensity when exercising independently;Short Term: Able to use daily as guideline for intensity in rehab Short Term: Able to state/look up THRR;Long Term: Able to use THRR to govern intensity when exercising independently;Short Term: Able to use daily as guideline for intensity in rehab    Understanding of Exercise Prescription Yes Yes Yes Yes    Intervention Provide education, explanation, and written materials on patient's individual exercise prescription Provide education, explanation, and written materials on patient's individual exercise prescription Provide education, explanation, and written materials on patient's individual exercise prescription Provide education, explanation, and written materials on patient's individual exercise prescription    Expected Outcomes Short Term: Able to explain program exercise prescription;Long Term: Able to explain home exercise prescription to exercise independently Short Term: Able to explain program exercise prescription;Long Term: Able to explain home exercise prescription to exercise independently Short Term: Able to explain program exercise prescription;Long Term: Able to explain home exercise prescription to exercise independently Short Term: Able to explain program exercise prescription;Long Term: Able to explain  home exercise prescription to exercise independently             Exercise Goals Re-Evaluation:  Exercise Goals Re-Evaluation     Row Name 04/16/22 0934 05/10/22 1017 06/03/22 1138         Exercise Goal Re-Evaluation   Exercise Goals Review Increase Physical Activity;Able to understand and use Dyspnea scale;Understanding of Exercise Prescription;Increase Strength and Stamina;Knowledge and understanding of Target Heart Rate Range (THRR);Able to understand and use rate of perceived exertion (RPE) scale Increase Physical Activity;Able to understand and use Dyspnea scale;Understanding of Exercise Prescription;Increase Strength and Stamina;Knowledge and understanding of Target Heart Rate Range (THRR);Able to understand and use rate of perceived exertion (RPE) scale Increase Physical Activity;Able to understand and use Dyspnea scale;Understanding of Exercise Prescription;Increase Strength and Stamina;Knowledge and understanding of Target Heart Rate Range (THRR);Able to understand and use rate of perceived exertion (RPE) scale     Comments Pt will begin exercise next week. Will monitor and progress as tolerated. Pt has completed 4 exercise sessions. He missed two sessions, one due to tachycardia and one due to weather. Pt is exercising on the recumbent stepper for 15 min, level 2, METs 1.7.  He then walks the track pushing a walker or wheelchair for 15 min, METs 2.08. He performs warm up and cool down standing with demonstrative cues. He is using red bands for resistance training. He is slowly progressing.  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 Will monitor and progress as tolerated. Pt has completed 11 exercise sessions. He has missed two sessions, one due to tachycardia and one due to weather. Pt is exercising on the recumbent stepper for 15 min, level 3, METs 2.3. He then walks the track pushing a four wheeled walker for 15 min, METs 2.23. He has been feeling more confident and pushing himself  more, increasing progression. He performs warm up and cool down standing. He is using red bands for resistance training.  Will monitor and progress as tolerated.     Expected Outcomes Through exercise at rehab and home, the patient will decrease shortness of breath with daily activities and feel confident in carrying out an exercise regimen at home. Through exercise at rehab and home, the patient will decrease shortness of breath with daily activities and feel confident in carrying out an exercise regimen at home. Through exercise at rehab and home, the patient will decrease shortness of breath with daily activities and feel confident in carrying out an exercise regimen at home.              Nutrition & Weight - Outcomes:    Nutrition:  Nutrition Therapy & Goals - 05/30/22 1430       Nutrition Therapy   Diet General Healthy Diet    Drug/Food Interactions Statins/Certain Fruits      Personal Nutrition Goals   Nutrition Goal Patient to identify strategies for weight gain of 0.5-2.0# per week    Comments Goals in progress. Chet reports eating three meals daily, drinking one Ensure daily, and drinking one homemade milkshake daily to aid with meeting nutrition needs and weight gain. He reports stable appetite and eating patterns. His family remains supportive. He will continue to benefit from participation in pulmonary rehab for nutrition, exercise, and lifestyle modification.      Intervention Plan   Intervention Prescribe, educate and counsel regarding individualized specific dietary modifications aiming towards targeted core components such as weight, hypertension, lipid management, diabetes, heart failure and other comorbidities.;Nutrition handout(s) given to patient.    Expected Outcomes Short Term Goal: Understand basic principles of dietary content, such as calories, fat, sodium, cholesterol and nutrients.;Long Term Goal: Adherence to prescribed nutrition plan.              Nutrition Discharge:  Nutrition Assessments - 06/07/22 0820       Rate Your Plate Scores   Post Score 53             Education Questionnaire Score:  Knowledge Questionnaire Score - 06/13/22 0854       Knowledge Questionnaire Score   Post Score 11/18             Goals reviewed with patient; copy given to patient.

## 2022-07-01 ENCOUNTER — Other Ambulatory Visit: Payer: Self-pay

## 2022-07-01 DIAGNOSIS — J849 Interstitial pulmonary disease, unspecified: Secondary | ICD-10-CM

## 2022-07-01 DIAGNOSIS — J84112 Idiopathic pulmonary fibrosis: Secondary | ICD-10-CM

## 2022-07-02 ENCOUNTER — Encounter: Payer: Self-pay | Admitting: Internal Medicine

## 2022-07-02 ENCOUNTER — Ambulatory Visit (INDEPENDENT_AMBULATORY_CARE_PROVIDER_SITE_OTHER): Payer: Medicare Other | Admitting: Internal Medicine

## 2022-07-02 ENCOUNTER — Ambulatory Visit: Payer: Medicare Other | Admitting: Internal Medicine

## 2022-07-02 VITALS — BP 120/80 | HR 83 | Ht 65.0 in | Wt 105.2 lb

## 2022-07-02 DIAGNOSIS — J849 Interstitial pulmonary disease, unspecified: Secondary | ICD-10-CM

## 2022-07-02 DIAGNOSIS — R5381 Other malaise: Secondary | ICD-10-CM | POA: Diagnosis not present

## 2022-07-02 DIAGNOSIS — K222 Esophageal obstruction: Secondary | ICD-10-CM | POA: Diagnosis not present

## 2022-07-02 DIAGNOSIS — J84112 Idiopathic pulmonary fibrosis: Secondary | ICD-10-CM | POA: Diagnosis not present

## 2022-07-02 DIAGNOSIS — R634 Abnormal weight loss: Secondary | ICD-10-CM

## 2022-07-02 NOTE — Patient Instructions (Signed)
Full PFT performed today. °

## 2022-07-02 NOTE — Patient Instructions (Addendum)
IPF (idiopathic pulmonary fibrosis) (HCC) Chronic respiratory failure after COVID-19 related hospitalization summer 2023   -PFT decreased 36% between MAy 2023 -> March 2024; continuied progrssive worsening but odd you are able to keep o2 levels [place]. STill concern is disease progression   Plan -continue esbriet 2 pills three times daily with food (see disussion on weight loss below) --Monitor oxygen levels  -Goal pulse ox should be greater than 92%  -For now continue to use oxygen at night regardless of pulse ox -LFT test next visit   Weight loss, non-intentional  - losing weight again with esbriet xsince  02/06/2021 and subsequent weight loss in October 2023 to 114# following hospitalization and now 105# in March 2024 - could be esbiriet v  swallowing isues   Plan - check weight at home (naked weight at same time daily) -monitor for weight loss with esbriet for now -Continue high-protein diet as below - complete GI workup (below)  COUGH and DYSPHAGIA and clearing throat New History of intubation - summer 2023  - likely due to IPF and cough neuropathy but dysphagia (esophageal stenosis) probably playing a role - 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  - s/p dilatation by Dr Benson [place] April 2023 - symptoms worse after COVID-related respiratory failure and hospitalization  -Possibly because the cervical web is worse but need to rule out postintubation laryngeal injury   Plan - agree with Dr Benson [place] trying to get sequential dilatation ; will email the endoscopy folks   Physical deconditioning - new since summer 2023  -Much improved after hospitalization and inpatient rehab but still deconditioned compared to baseline  Plan  - Continue home physical therapy -Continue strengthening exercises -Continue high-protein vegetarian diet through legumes and tofu   Follow-up -12 weeks do spirometry and DLCO and return to see Dr. Chase Caller for a 30-minute  - symptoms score and walk  test on RA at followup

## 2022-07-02 NOTE — Progress Notes (Signed)
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   Prior cough in  2019 for several months  resolved on on some kind of powder inhaler x one week = Advair     History of Present Illness  08/04/2019  Pulmonary/ 1st office eval/Jeff Willis  Chief Complaint  Patient presents with   Pulmonary Consult    Referred by Dr Jeff Willis. Pt c/o non prod cough and throat clearing x 6 months.   Dyspnea:  None/ no aerobics, walking neighborhoods ok  Cough: always dry/ any candy helps if keeps in mouth but esp hard candy / does not use cough drops  Sleep: rotates on side, bed is flat/ one pillow, never wakes up cough but /win 15 min of stirring recurs daily  SABA use: never, also never prednisone Always trouble swallowing pills x lifetime  Call Dr Jeff Willis office and ask what inhaler you received in 2019 as sample that seemed to help your cough = advair dpi Stop cetrizine, flonase  Prednisone 10 mg take  4 each am x 2 days,   2 each am x 2 days,  1 each am x 2 days and stop  Pantoprazole (protonix) 40 mg   Take  30-60 min before first meal of the day and Pepcid (famotidine)  20 mg one after supper  until return to office  GERD   Gabapentin 100 mg four times daily (ok to stop when no coughing at all for a week)  Please schedule a follow up office visit in 4 weeks, sooner if needed  with all medications /inhalers/ solutions in hand so we can verify exactly what you are taking. This includes all  medications from all doctors and over the counters    09/06/2019  f/u ov/Jeff Willis re: uacs on gabapenitn 100 mg three times  wolicki had tried gabapentin but failed to reveal that at last ov when I recommended it  Chief Complaint  Patient presents with   Follow-up    4 wk f/u. Wants to discuss switching Advair.   Dyspnea:  No  Cough: better just while on prednisone maybe 50-80%   Sleeping: no noct cough SABA use: none 02: none  rec Gabapentin 200 mg up to 4 x daily  Pantoprazole (protonix) 40 mg   Take  30-60 min before first meal of the day and Pepcid (famotidine)  20 mg one @  bedtime until return to office - this is the best way to tell whether stomach acid is contributing to your problem.   Keep the candy handy  Prednisone 10 mg  Take 4 for three days 3 for three days 2 for three days 1 for three days and stop  If not better in 2 weeks see Dr Jeff Willis for your reflux  Please remember to go to the lab department and xray dept  for your tests - we will  call you with the results when they are available.         OV 10/05/2019  Subjective:  [name], male , DOB: 1941/03/26 , age 82 y.o. , MRN: [id number] , ADDRESS: [name] [address]   10/05/2019 -   Chief Complaint  Patient presents with   Follow-up    Pt states he has been doing okay since last visit and denies any complaints.   -82 year old [place] male.  Referred for interstitial lung disease.  He is accompanied by his son Jeff Willis who is a Software engineer.  I know Jeff Willis  from a few years ago when heused to accompany his aunt; asthma patient of mine. Jeff Willis  has has been referred by Dr. Christinia Willis to the ILD center.  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  Up until the pandemic used to travel quite frequently.  He is to go to [place] for 3 months.  At none of the properties he is being exposed to mold.  He developed insidious onset of chronic cough over a year ago  this has been persistent.  In the last few months several cough related treatments have been tried with some improvement.  Has been on gabapentin but this made him sleepy and is currently stopped it.  Because of chronic cough which he only attributes his constant clearing of the throat he had a high-resolution CT scan of the chest that I personally visualized and shows probable UIP pattern and therefore he has been referred here.  In my personal visualization it might even be definite of UIP because there might be some early honeycombing there.  He denies any shortness of breath.  At rest he was found to be tachycardic but he says this can at baseline for him.  He will follow this up with his primary care physician.  [place] Integrated Comprehensive( ILD Questionnaire  Symptoms:   He denies any shortness of breath.  No episodic shortness of breath.  However on the questioning he does report mild shortness of breath climbing up stairs.  In terms of his cough it started in June 2020.  It is the same since it started maybe in the last week it is slightly better.  Is moderate in intensity.  He does clear the throat.  Mostly dry cough occasionally white sputum.  Does feel a tickle in the back of his throat.  He is not waking up in the middle of the night.  Occasionally affects his voice.  No nausea no vomiting no diarrhea no wheezing.   Past Medical History : Denies any asthma COPD or heart failure rheumatoid arthritis or scleroderma or lupus or polymyositis or Sjogren's.  Denies sleep apnea.  Denies HIV denies pulmonary hypertension.  Denies diabetes or thyroid disease or stroke or seizures mononucleosis.  Denies any hepatitis or tuberculosis or kidney disease or pneumonia or blood clots or heart disease or pleurisy.  Does have on and off acid reflux for the last year   ROS: Positive for dry mouth for the last several months.  He also has some dysphagia for swallowing pills unclear for what duration.  He  also has acid reflux on PPI and H2 blocker.  Denies any Raynaud's or recurrent fever or weight loss.  No nausea no vomiting.   FAMILY HISTORY of LUNG DISEASE: * -Denies family history of COPD or asthma sarcoid or cystic fibrosis of hypersensitive pneumonitis or autoimmune disease.   EXPOSURE HISTORY: Denies  any Covid.  Denies smoking cigarettes.  Denies marijuana denies smoking.  Denies IV drug use denies cocaine   HOME and HOBBY DETAILS : Single-family home [place] for the last 10 years.  Age of the home is 15 years.  No dampness.  No mildew.  No humidifier use.  No CPAP use no nebulizer machine use.  He does use a steam iron but there is no mold or mildew in it.  There is no [place] inside the house no pet birds or parakeets.  No pet gerbils no feather pillows no mold in the [facility] duct no music habits.  He does do some gardening very small garden in the house limited gardening mostly just water the garden.  No bird feather exposure no flood damage no strong mats no hot tub or Jacuzzi.  No exposure to animals at work.   OCCUPATIONAL HISTORY (122 questions) :   -Organic antigen exposure is negative inorganic exposure is negative   PULMONARY TOXICITY HISTORY (27 items):  -18-day prednisone course that ended last week.  He did have seen implantation for prostate cancer in October 2019 these were radiation seeds.      Results for Jeff Willis, Jeff Willis (MRN [id number]) as of 11/15/2019 16:27  Ref. Range 09/07/2019 11:29 10/05/2019 11:25  Anti Nuclear Antibody (ANA) Latest Ref Range: NEGATIVE   POSITIVE (A)  ANA Pattern 1 Unknown  Nuclear, Speckled (A)  ANA Titer 1 Latest Units: titer  1:40 (H)  Angiotensin-Converting Enzyme Latest Ref Range: 9 - 67 U/L  34  Cyclic Citrullin Peptide Ab Latest Units: UNITS  <16  ds DNA Ab Latest Units: IU/mL  <1  Myeloperoxidase Abs Latest Units: AI  <1.0  Serine Protease 3 Latest Units: AI  <1.0  RA Latex Turbid. Latest Ref Range: <14 IU/mL  <14   IgE (Immunoglobulin E), Serum Latest Ref Range: <OR=114 kU/L 36   SSA (Ro) (ENA) Antibody, IgG Latest Ref Range: <1.0 NEG AI  <1.0 NEG  SSB (La) (ENA) Antibody, IgG Latest Ref Range: <1.0 NEG AI  <1.0 NEG  Scleroderma (Scl-70) (ENA) Antibody, IgG Latest Ref Range: <1.0 NEG AI  <1.0 NEG     Simple office walk 185 feet x  3 laps goal with forehead probe 10/05/2019   O2 used ra  Number laps completed 3  Comments about pace avt  Resting Pulse Ox/HR 100% and 122/min  Final Pulse Ox/HR 100% and 143/min  Desaturated </= 88% x  Desaturated <= 3% points x  Got Tachycardic >/= 90/min x  Symptoms at end of test x  Miscellaneous comments x    Results for Jeff Willis, Jeff Willis (MRN [id number]) as of 10/05/2019 10:40  Ref. Range 09/07/2019 11:29  Eosinophils Absolute Latest Ref Range: 0.0 - 0.7 K/uL 0.1     High-resolution CT chest Sep 15, 2019: Personally visualized and agree with the findings below IMPRESSION: 1. Spectrum of findings compatible with basilar predominant fibrotic interstitial lung disease with suggestion of early honeycombing. 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 [place], Iss 5, [phone], Dec 28 2016. 2. Ectatic 4.4 cm ascending thoracic aorta. Recommend annual imaging followup by CTA or MRA. This recommendation follows 2010 ACCF/AHA/AATS/ACR/ASA/SCA/SCAI/SIR/STS/SVM Guidelines for the Diagnosis and Management of Patients with Thoracic Aortic Disease. Circulation. 2010; [id number]. Aortic aneurysm NOS (ICD10-I71.9). 3. One vessel coronary atherosclerosis. 4. Aortic Atherosclerosis (ICD10-I70.0).     Electronically Signed  By: Ilona Sorrel M.D.   On: 09/15/2019 14:26     Results for JASHUN, EMRICK (MRN [id number]) as of 10/05/2019 10:40  Ref. Range 09/07/2019 11:29  Sheep Sorrel IgE Latest Units: kU/L  <0.10  Pecan/Hickory Tree IgE Latest Units: kU/L <0.10  IgE (Immunoglobulin E), Serum Latest Ref Range: <OR=114 kU/L 36  Allergen, D pternoyssinus,d7 Latest Units: kU/L <0.10  Cat Dander Latest Units: kU/L <0.10  Dog Dander Latest Units: kU/L <0.10  [place] Grass Latest Units: kU/L <0.10  Johnson Grass Latest Units: kU/L <0.10  Timothy Grass Latest Units: kU/L <0.10  Cockroach Latest Units: kU/L <0.10  Aspergillus fumigatus, m3 Latest Units: kU/L <0.10  Allergen, Comm Silver Wendee Copp, t9 Latest Units: kU/L <0.10  Allergen, Cottonwood, t14 Latest Units: kU/L <0.10  Elm IgE Latest Units: kU/L <0.10  Allergen, Mulberry, t76 Latest Units: kU/L <0.10  Allergen, Oak,t7 Latest Units: kU/L <0.10  COMMON RAGWEED (SHORT) (W1) IGE Latest Units: kU/L 0.15 (H)  Allergen, Mouse Urine Protein, e78 Latest Units: kU/L <0.10  D. farinae Latest Units: kU/L <0.10  Allergen, Cedar tree, t12 Latest Units: kU/L <0.10  Box Elder IgE Latest Units: kU/L <0.10  Rough Pigweed  IgE Latest Units: kU/L <0.10    ROS - per HPI   OV 11/15/2019  Subjective:  [name], male , DOB: 1940-06-15 , age 63 y.o. , MRN: [id number] , ADDRESS: [place] [address]   11/15/2019 -   Chief Complaint  Patient presents with   Follow-up    Pt states the cough has been better since last visit.      HPI Jeff Willis 82 y.o. -presents with his son Jeff Willis for follow-up.  He is here to discuss test results.  In the interim he called for worsening cough.  We gave him prednisone.  He tells me the prednisone actually made the cough worse particularly towards the end.  After that the son  doubled up his PPI and also added H2 blockade and the cough went away completely.  Patient feels completely normal that he is possible that he has interstitial lung disease.  We reviewed the scan and I visualized it with him together and his son.  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  They wanted to discuss the implications  of the scan in the setting of controlled acid reflux and controlled cough but he is largely asymptomatic at this point.  Noted his serologies were negative except for trace ANA and his overnight oxygen study was normal.     OV 01/17/2020   Subjective:  [name], male , DOB: [birth year], age 73 y.o. years. , MRN: [id number],  ADDRESS: [place] [address] PCP  Gaynelle Arabian, MD Providers : Treatment Team:  Attending Provider: Brand Males, MD   Chief Complaint  Patient presents with   Follow-up    pt is here to go over ct and pft    Follow-up interstitial lung disease Follow-up associated cough partially controlled with acid reflux treatment Associated weight loss present   HPI Belton G Gang 82 y.o. -presents with his son Jeff Willis.  In the interim he is continue to lose weight.  He has lost another 3-4 pounds since June 2021.  His clothes are much looser.  He thinks he has been steadily losing weight.  His respiratory symptoms are not any worse although he says that his cough that seem to initially improve with doubling up of acid reflux therapy seems to persist.  At last visit in September 2020 when I thought the cough resolved but they tell me the cough only partially improved.  Nevertheless it is persisting and they are worried about it.  In addition he is worried about weight loss.  He also has poor appetite.  He is not in any pulmonary fibrosis antifibrotic's.  There is no diarrhea.  Symptom score suggest some element of anxiety since his last visit.  He has had pulmonary function test and it shows his FVC is actually better but his DLCO is actually worse.  He had high-resolution CT chest that I visualized and reviewed with him.  It shows probable UIP with possible emerging honeycombing.  I agree with the radiologist.  The radiologist actually feels that it might be a little bit worse compared to few months ago.  Of note: He has  upcoming trip to [place] between March 05, 2020 and February 2022.  He wants to make sure his care is coordinated.  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 Evaluate for pulmonary fibrosis.   EXAM: CT CHEST WITHOUT CONTRAST   TECHNIQUE: Multidetector CT imaging of the chest was performed following the standard protocol without intravenous contrast. High resolution imaging of the lungs, as well as inspiratory and expiratory imaging, was performed.   COMPARISON:  Chest CT 09/15/2019.   FINDINGS: Cardiovascular: Heart size is normal. There is no significant pericardial fluid, thickening or pericardial calcification. There is aortic atherosclerosis, as well as atherosclerosis of the great vessels of the mediastinum and the coronary arteries, including calcified atherosclerotic plaque in the left main, left anterior descending and right coronary arteries. Dilatation of the ascending thoracic aorta measuring 4.5 cm in diameter.   Mediastinum/Nodes: No pathologically enlarged mediastinal or hilar lymph nodes. Please note that accurate exclusion of hilar adenopathy is limited on noncontrast CT scans. Esophagus is unremarkable in appearance. No axillary lymphadenopathy.   Lungs/Pleura: High-resolution images demonstrate widespread areas of ground-glass attenuation, septal thickening, thickening of the peribronchovascular interstitium, mild cylindrical bronchiectasis and peripheral bronchiolectasis. There is a suggestion of, but not definitive evidence of, early honeycombing in portions of the lungs. These findings do have a definitive craniocaudal gradient and appear slightly progressive compared to the prior study. Inspiratory and expiratory imaging is unremarkable. No acute consolidative airspace disease. No pleural effusions. No definite suspicious  appearing pulmonary nodules or masses are noted.   Upper Abdomen: Cortical calcification in the upper pole of the right kidney incidentally noted.   Musculoskeletal: There are no aggressive appearing lytic or blastic lesions noted in the visualized portions of the skeleton.   IMPRESSION: 1. The appearance of the lungs is compatible with progressive interstitial lung disease categorized as probable usual interstitial pneumonia (UIP) per current ATS guidelines. 2. Aortic atherosclerosis, in addition to left main and 2 vessel coronary artery disease. In addition, there is mild aneurysmal dilatation of the ascending thoracic aorta (4.5 cm in diameter). Ascending thoracic aortic aneurysm. Recommend semi-annual imaging followup by CTA or MRA and referral to cardiothoracic surgery if not already obtained. This recommendation follows 2010 ACCF/AHA/AATS/ACR/ASA/SCA/SCAI/SIR/STS/SVM Guidelines for the Diagnosis and Management of Patients With Thoracic Aortic Disease. Circulation. 2010; [id number]. Aortic aneurysm NOS (ICD10-I71.9).   Aortic Atherosclerosis (ICD10-I70.0). Aortic aneurysm NOS (ICD10-I71.9).     Electronically Signed   By: Vinnie Langton M.D.   On: 01/13/2020 15:15  ROS - per HPI    OV 03/01/2020  Subjective:  [name], male , DOB: 01-24-1941 , age 72 y.o. , MRN: [id number] , ADDRESS: [address] [place] [address] PCP Gaynelle Arabian, MD Patient Care Team: Gaynelle Arabian, MD as PCP - General (Family Medicine)  This Provider for this visit: Treatment Team:  Attending Provider: Brand Males, MD  FU IPF Associated weight loss + pre-ofev   03/01/2020 -   Chief Complaint  Patient presents with   Follow-up    ILD, doing ok, cough has not improved     HPI Kyuss G Fischetti 82 y.o. -returns for follow-up.  This is after starting nintedanib.  He is currently on 100 mg twice daily for 2 weeks.  His prescription is  approved on February 02, 2020.  He accidentally got 150 mg twice daily but he sent this back.  He is going to send [place] back.  He has upcoming travel to [place] later this week he will be gone there for till February 2022.  He has supply and refill of his nintedanib on 06 March 2020.  After that he does not have nintedanib.  He and his son report that somebody will be able to go to [place] and deliver it to him.  They do not want to trust the courier service to take it there the supply chain issues.  At this point time from a respiratory standpoint he has minimal dyspnea.  He still has a cough.  The cough is not worse but after reducing the PPI dose the cough has come back.  It is somewhat troublesome.  The severity score is listed below.  They report the GI Dr. Collene Willis has reported concern about taking very high-dose of PPI and H2 blockade which actually helped the cough.  Is because of osteoporosis risk.  I agreed with that concern.  However patient is also reporting significant symptom burden.  Primary care has suggested that they follow the symptom control and quality of life issues.  I did support the primary care viewpoint as well.  Did explain to them at the end of the day as a risk-benefit ratio and with an advanced disease like pulmonary fibrosis 1 might have to consider taking a short-term again with cough control at the expense of long-term risk of side effects.  They are going to think about this.  In terms of his travel to [place].  He put me in touch with Dr. Posey Pronto.  I texted him and establish contact.  He will follow with Dr. Posey Pronto who is a pulmonologist trained in [place] but now living in [place].  Gave other travel advice are listed below.  In terms of weight loss: This is ongoing.  Even after starting the Ofev is not having any GI side effects but his weight loss continues.  He had a CT abdomen that is not showing any reason for weight loss.  Apparently had some hematuria had cystoscopy with Dr. Junious Silk.  He  has some radiation seed implants.  There is no bladder cancer.  The really frustrated by this.  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   Prior cough in  2019 for several months  resolved on on some kind of powder inhaler x one week = Advair     History of Present Illness  08/04/2019  Pulmonary/ 1st office eval/Jeff Willis  Chief Complaint  Patient presents with   Pulmonary Consult    Referred by Dr Jeff Willis. Pt c/o non prod cough and throat clearing x 6 months.   Dyspnea:  None/ no aerobics, walking neighborhoods ok  Cough: always dry/ any candy helps if keeps in mouth but esp hard candy / does not use cough drops  Sleep: rotates on side, bed is flat/ one pillow, never wakes up cough but /win 15 min of stirring recurs daily  SABA use: never, also never prednisone Call Dr Jeff Willis office and ask what inhaler you received in 2019 as sample that seemed to help your cough = advair dpi Stop cetrizine, flonase  Prednisone 10 mg take  4 each am x 2 days,   2 each am x 2 days,  1 each am x 2 days and stop  Pantoprazole (protonix) 40 mg   Take  30-60 min before first meal of the day and Pepcid (famotidine)  20 mg one after supper  until return to office  GERD   Gabapentin 100 mg  four times daily (ok to stop when no coughing at all for a week)  Please schedule a follow up office visit in 4 weeks, sooner if needed  with all medications /inhalers/ solutions in hand so we can verify exactly what you are taking. This includes all medications from all doctors and over the counters    09/06/2019  f/u ov/Jeff Willis re: uacs on gabapenitn 100 mg three times  wolicki had tried gabapentin but failed to reveal that at last ov when I recommended it  Chief Complaint  Patient presents with   Follow-up    4 wk f/u. Wants to discuss switching Advair.   Dyspnea:  No  Cough: better just while on prednisone maybe 50-80%   Sleeping: no noct cough SABA use: none 02: none  rec Gabapentin 200 mg up to 4 x daily  Pantoprazole (protonix) 40 mg   Take  30-60 min before first meal of the day and Pepcid (famotidine)  20 mg one @  bedtime until return to office - this is the best way to tell whether stomach acid is contributing to your problem.   Keep the candy handy  Prednisone 10 mg  Take 4 for three days 3 for three days 2 for three days 1 for three days and stop  If not better in 2 weeks see Dr Jeff Willis for your reflux  Please remember to go to the lab department and xray dept  for your tests - we will call you with the results when they are available.         OV 10/05/2019  Subjective:  [name], male , DOB: [birth year] ,  age 49 y.o. , MRN: [id number] , ADDRESS: [place] [address]   10/05/2019 -   Chief Complaint  Patient presents with   Follow-up    Pt states he has been doing okay since last visit and denies any complaints.   -82 year old [place] male.  Referred for interstitial lung disease.  He is accompanied by his son Jeff Willis who is a Software engineer.  I know Jeff Willis  from a few years ago when heused to accompany his aunt; asthma patient of mine. Jeff Willis  has has been referred by Dr. Christinia Willis to the ILD center.  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  Up until the pandemic used to travel quite frequently.  He is to go to [place] for 3 months.  At none of the properties he is being exposed to mold.  He developed insidious onset of chronic cough over a year ago this has been persistent.  In the last few months several cough related treatments have been tried with some improvement.  Has been on gabapentin but this made him sleepy and is currently stopped it.  Because of chronic cough which he only attributes his constant clearing of the throat he had a high-resolution CT scan of the chest that I personally visualized and shows probable UIP pattern and therefore he has been referred here.  In my personal visualization it might even be definite of UIP because there might be some early honeycombing there.  He denies any shortness of breath.  At rest he was found to be tachycardic but he says this can at baseline for him.  He will follow this up with his primary care physician.   Integrated Comprehensive( ILD Questionnaire  Symptoms:   He denies any shortness of breath.  No episodic shortness of breath.  However on the questioning he does report mild shortness of breath climbing up stairs.  In terms of his cough it started in June 2020.  It is the same since it started maybe in the last week it is slightly better.  Is moderate in intensity.  He does clear the throat.  Mostly dry cough occasionally white sputum.  Does feel a tickle in the back of his throat.  He is not waking up in the middle of the night.  Occasionally affects his voice.  No nausea no vomiting no diarrhea no wheezing.     Past Medical History : Denies any asthma COPD or heart failure rheumatoid arthritis or scleroderma or lupus or polymyositis or Sjogren's.  Denies sleep apnea.  Denies HIV denies pulmonary hypertension.  Denies diabetes or thyroid disease or stroke or seizures mononucleosis.  Denies any hepatitis or tuberculosis or kidney  disease or pneumonia or blood clots or heart disease or pleurisy.  Does have on and off acid reflux for the last year   ROS: Positive for dry mouth for the last several months.  He also has some dysphagia for swallowing pills unclear for what duration.  He also has acid reflux on PPI and H2 blocker.  Denies any Raynaud's or recurrent fever or weight loss.  No nausea no vomiting.   FAMILY HISTORY of LUNG DISEASE: * -Denies family history of COPD or asthma sarcoid or cystic fibrosis of hypersensitive pneumonitis or autoimmune disease.   EXPOSURE HISTORY: Denies any Covid.  Denies smoking cigarettes.  Denies marijuana denies smoking.  Denies IV drug use denies cocaine   HOME and HOBBY DETAILS : Single-family home [place] the [place]  [place] for the last 10 years.  Age of the home is 15 years.  No dampness.  No mildew.  No humidifier use.  No CPAP use no nebulizer machine use.  He does use a steam iron but there is no mold or mildew in it.  There is no [place] inside the house no pet birds or parakeets.  No pet gerbils no feather pillows no mold in the [facility] duct no music habits.  He does do some gardening very small garden in the house limited gardening mostly just water the garden.  No bird feather exposure no flood damage no strong mats no hot tub or Jacuzzi.  No exposure to animals at work.   OCCUPATIONAL HISTORY (122 questions) :   -Organic antigen exposure is negative inorganic exposure is negative   PULMONARY TOXICITY HISTORY (27 items):  -18-day prednisone course that ended last week.  He did have seen implantation for prostate cancer in October 2019 these were radiation seeds.      Results for Jeff Willis, Jeff Willis (MRN [id number]) as of 11/15/2019 16:27  Ref. Range 09/07/2019 11:29 10/05/2019 11:25  Anti Nuclear Antibody (ANA) Latest Ref Range: NEGATIVE   POSITIVE (A)  ANA Pattern 1 Unknown  Nuclear, Speckled (A)  ANA Titer 1 Latest Units: titer  1:40 (H)  Angiotensin-Converting Enzyme  Latest Ref Range: 9 - 67 U/L  34  Cyclic Citrullin Peptide Ab Latest Units: UNITS  <16  ds DNA Ab Latest Units: IU/mL  <1  Myeloperoxidase Abs Latest Units: AI  <1.0  Serine Protease 3 Latest Units: AI  <1.0  RA Latex Turbid. Latest Ref Range: <14 IU/mL  <14  IgE (Immunoglobulin E), Serum Latest Ref Range: <OR=114 kU/L 36   SSA (Ro) (ENA) Antibody, IgG Latest Ref Range: <1.0 NEG AI  <1.0 NEG  SSB (La) (ENA) Antibody, IgG Latest Ref Range: <1.0 NEG AI  <1.0 NEG  Scleroderma (Scl-70) (ENA) Antibody, IgG Latest Ref Range: <1.0 NEG AI  <1.0 NEG     Simple office walk 185 feet x  3 laps goal with forehead probe 10/05/2019   O2 used ra  Number laps completed 3  Comments about pace avt  Resting Pulse Ox/HR 100% and 122/min  Final Pulse Ox/HR 100% and 143/min  Desaturated </= 88% x  Desaturated <= 3% points x  Got Tachycardic >/= 90/min x  Symptoms at end of test x  Miscellaneous comments x    Results for Jeff Willis, Jeff Willis (MRN [id number]) as of 10/05/2019 10:40  Ref. Range 09/07/2019 11:29  Eosinophils Absolute Latest Ref Range: 0.0 - 0.7 K/uL 0.1     High-resolution CT chest Sep 15, 2019: Personally visualized and agree with the findings below IMPRESSION: 1. Spectrum of findings compatible with basilar predominant fibrotic interstitial lung disease with suggestion of early honeycombing. 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 [place], Iss 5, [phone], Dec 28 2016. 2. Ectatic 4.4 cm ascending thoracic aorta. Recommend annual imaging followup by CTA or MRA. This recommendation follows 2010 ACCF/AHA/AATS/ACR/ASA/SCA/SCAI/SIR/STS/SVM Guidelines for the Diagnosis and Management of Patients with Thoracic Aortic Disease. Circulation. 2010; [id number]. Aortic aneurysm NOS (ICD10-I71.9). 3. One vessel coronary  atherosclerosis. 4. Aortic Atherosclerosis (ICD10-I70.0).     Electronically Signed   By: Ilona Sorrel M.D.   On: 09/15/2019 14:26     Results for Jeff Willis, Jeff Willis (MRN [id number]) as of 10/05/2019 10:40  Ref.  Range 09/07/2019 11:29  Sheep Sorrel IgE Latest Units: kU/L <0.10  Pecan/Hickory Tree IgE Latest Units: kU/L <0.10  IgE (Immunoglobulin E), Serum Latest Ref Range: <OR=114 kU/L 36  Allergen, D pternoyssinus,d7 Latest Units: kU/L <0.10  Cat Dander Latest Units: kU/L <0.10  Dog Dander Latest Units: kU/L <0.10  [place] Grass Latest Units: kU/L <0.10  Johnson Grass Latest Units: kU/L <0.10  Timothy Grass Latest Units: kU/L <0.10  Cockroach Latest Units: kU/L <0.10  Aspergillus fumigatus, m3 Latest Units: kU/L <0.10  Allergen, Comm Silver Wendee Copp, t9 Latest Units: kU/L <0.10  Allergen, Cottonwood, t14 Latest Units: kU/L <0.10  Elm IgE Latest Units: kU/L <0.10  Allergen, Mulberry, t76 Latest Units: kU/L <0.10  Allergen, Oak,t7 Latest Units: kU/L <0.10  COMMON RAGWEED (SHORT) (W1) IGE Latest Units: kU/L 0.15 (H)  Allergen, Mouse Urine Protein, e78 Latest Units: kU/L <0.10  D. farinae Latest Units: kU/L <0.10  Allergen, Cedar tree, t12 Latest Units: kU/L <0.10  Box Elder IgE Latest Units: kU/L <0.10  Rough Pigweed  IgE Latest Units: kU/L <0.10    ROS - per HPI   OV 11/15/2019  Subjective:  [name], male , DOB: [birth year] , age 27 y.o. , MRN: [id number] , ADDRESS: [place] [id number]   11/15/2019 -   Chief Complaint  Patient presents with   Follow-up    Pt states the cough has been better since last visit.      HPI [place] 82 y.o. -presents with his son Jeff Willis for follow-up.  He is here to discuss test results.  In the interim he called for worsening cough.  We gave him prednisone.  He tells me the prednisone actually made the cough worse particularly towards the end.  After that the son  doubled up his PPI and  also added H2 blockade and the cough went away completely.  Patient feels completely normal that he is possible that he has interstitial lung disease.  We reviewed the scan and I visualized it with him together and his son.  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  They wanted to discuss the implications of the scan in the setting of controlled acid reflux and controlled cough but he is largely asymptomatic at this point.  Noted his serologies were negative except for trace ANA and his overnight oxygen study was normal.     OV 01/17/2020   Subjective:  [name], male , DOB: 05/26/1940, age 8 y.o. years. , MRN: [id number],  ADDRESS: [place] [address] PCP  Gaynelle Arabian, MD Providers : Treatment Team:  Attending Provider: Brand Males, MD   Chief Complaint  Patient presents with   Follow-up    pt is here to go over ct and pft    Follow-up interstitial lung disease Follow-up associated cough partially controlled with acid reflux treatment Associated weight loss present   HPI Dewell G Poirier 82 y.o. -presents with his son Jeff Willis.  In the interim he is continue to lose weight.  He has lost another 3-4 pounds since June 2021.  His clothes are much looser.  He thinks he has been steadily losing weight.  His respiratory symptoms are not any worse although he says that his cough that seem to initially improve with doubling up of acid reflux therapy seems to persist.  At last visit in September 2020 when I thought the cough resolved but they tell me the cough only partially improved.  Nevertheless it is persisting and  they are worried about it.  In addition he is worried about weight loss.  He also has poor appetite.  He is not in any pulmonary fibrosis antifibrotic's.  There is no diarrhea.  Symptom score suggest some element of anxiety since his last visit.  He has had pulmonary function test and it shows his FVC is actually better but his DLCO is actually  worse.  He had high-resolution CT chest that I visualized and reviewed with him.  It shows probable UIP with possible emerging honeycombing.  I agree with the radiologist.  The radiologist actually feels that it might be a little bit worse compared to few months ago.  Of note: He has upcoming trip to [place] between March 05, 2020 and February 2022.  He wants to make sure his care is coordinated.  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 Evaluate for pulmonary fibrosis.   EXAM: CT CHEST WITHOUT CONTRAST   TECHNIQUE: Multidetector CT imaging of the chest was performed following the standard protocol without intravenous contrast. High resolution imaging of the lungs, as well as inspiratory and expiratory imaging, was performed.   COMPARISON:  Chest CT 09/15/2019.   FINDINGS: Cardiovascular: Heart size is normal. There is no significant pericardial fluid, thickening or pericardial calcification. There is aortic atherosclerosis, as well as atherosclerosis of the great vessels of the mediastinum and the coronary arteries, including calcified atherosclerotic plaque in the left main, left anterior descending and right coronary arteries. Dilatation of the ascending thoracic aorta measuring 4.5 cm in diameter.   Mediastinum/Nodes: No pathologically enlarged mediastinal or hilar lymph nodes. Please note that accurate exclusion of hilar adenopathy is limited on noncontrast CT scans. Esophagus is unremarkable in appearance. No axillary lymphadenopathy.   Lungs/Pleura: High-resolution images demonstrate widespread areas of ground-glass attenuation, septal thickening, thickening of the peribronchovascular interstitium, mild cylindrical bronchiectasis and peripheral bronchiolectasis. There is a suggestion of, but not definitive evidence of, early honeycombing  in portions of the lungs. These findings do have a definitive craniocaudal gradient and appear slightly progressive compared to the prior study. Inspiratory and expiratory imaging is unremarkable. No acute consolidative airspace disease. No pleural effusions. No definite suspicious appearing pulmonary nodules or masses are noted.   Upper Abdomen: Cortical calcification in the upper pole of the right kidney incidentally noted.   Musculoskeletal: There are no aggressive appearing lytic or blastic lesions noted in the visualized portions of the skeleton.   IMPRESSION: 1. The appearance of the lungs is compatible with progressive interstitial lung disease categorized as probable usual interstitial pneumonia (UIP) per current ATS guidelines. 2. Aortic atherosclerosis, in addition to left main and 2 vessel coronary artery disease. In addition, there is mild aneurysmal dilatation of the ascending thoracic aorta (4.5 cm in diameter). Ascending thoracic aortic aneurysm. Recommend semi-annual imaging followup by CTA or MRA and referral to cardiothoracic surgery if not already obtained. This recommendation follows 2010 ACCF/AHA/AATS/ACR/ASA/SCA/SCAI/SIR/STS/SVM Guidelines for the Diagnosis and Management of Patients With Thoracic Aortic Disease. Circulation. 2010; [id number]. Aortic aneurysm NOS (ICD10-I71.9).   Aortic Atherosclerosis (ICD10-I70.0). Aortic aneurysm NOS (ICD10-I71.9).     Electronically Signed   By: Vinnie Langton M.D.   On: 01/13/2020 15:15    ROS - per HPI    OV 03/01/2020  Subjective:  [name], male , DOB: 02-Aug-1940 , age 76 y.o. , MRN:  [id number] , ADDRESS: [place] [id number] PCP Gaynelle Arabian, MD Patient Care Team: Gaynelle Arabian, MD as PCP - General (Family Medicine)  This Provider for this visit: Treatment Team:  Attending Provider: Brand Males, MD  FU IPF Associated weight loss +  pre-ofev   03/01/2020 -   Chief Complaint  Patient presents with   Follow-up    ILD, doing ok, cough has not improved     HPI Aadi G Pennino 82 y.o. -returns for follow-up.  This is after starting nintedanib.  He is currently on 100 mg twice daily for 2 weeks.  His prescription is approved on February 02, 2020.  He accidentally got 150 mg twice daily but he sent this back.  He is going to send [place] back.  He has upcoming travel to [place] later this week he will be gone there for till February 2022.  He has supply and refill of his nintedanib on 06 March 2020.  After that he does not have nintedanib.  He and his son report that somebody will be able to go to [place] and deliver it to him.  They do not want to trust the courier service to take it there the supply chain issues.  At this point time from a respiratory standpoint he has minimal dyspnea.  He still has a cough.  The cough is not worse but after reducing the PPI dose the cough has come back.  It is somewhat troublesome.  The severity score is listed below.  They report the GI Dr. Collene Willis has reported concern about taking very high-dose of PPI and H2 blockade which actually helped the cough.  Is because of osteoporosis risk.  I agreed with that concern.  However patient is also reporting significant symptom burden.  Primary care has suggested that they follow the symptom control and quality of life issues.  I did support the primary care viewpoint as well.  Did explain to them at the end of the day as a risk-benefit ratio and with an advanced disease like pulmonary fibrosis 1 might have to consider taking a short-term again with cough control at the expense of long-term risk of side effects.  They are going to think about this.  In terms of his travel to [place].  He put me in touch with Dr. Posey Pronto.  I texted him and establish contact.  He will follow with Dr. Posey Pronto who is a pulmonologist trained in [place] but now living in [place].  Gave other travel  advice are listed below.  In terms of weight loss: This is ongoing.  Even after starting the Ofev is not having any GI side effects but his weight loss continues.  He had a CT abdomen that is not showing any reason for weight loss.  Apparently had some hematuria had cystoscopy with Dr. Junious Silk.  He has some radiation seed implants.  There is no bladder cancer.  The really frustrated by this.  I did pull out an article about IPF and weight loss for the son to read.- shows IPF weight loss is asssocited with worse progrnosis    ROS - per HPI    OV 07/24/2020  Subjective:  [name], male , DOB: 1940/08/04 , age 2 y.o. , MRN: [id number] , ADDRESS: [place] [id number] PCP Gaynelle Arabian, MD Patient Care Team: Gaynelle Arabian, MD as PCP - General (Family Medicine)  This Provider for this visit: Treatment Team:  Attending Provider: Brand Males,  MD    07/24/2020 -   Chief Complaint  Patient presents with   Follow-up    4 mo f/u. States his breathing has been stable since last visit. Has noticed a decrease in his appetite. Increased diarrhea for the past week.    Idiopathic pulmonary fibrosis on nintedanib 100 mg twice daily -last CT September 2021 Weight loss unintentional even before nintedanib  HPI Daveyon G Mcguinness 82 y.o. -returns for follow-up.  He was in [place] and returned back in early March 2022.  He is now here with his wife.  He tells me while he was in [place] for 4 months he had a great time.  He barely had any diarrhea.  Approximately 2 months ago which is 1 month before his return he switched to an [place] version of nintedanib called NINTIB.  He was taking it for a month in [place] and did not have any problems.  Upon returning to the [place] he is starting to have diarrhea.  The diarrhea is ongoing and is 3 out of 5.  Happens 2 or 3 times a week and controlled by Imodium.  His son who is a Software engineer thinks it is because he  is in the [place] version of nintedanib.  He says he spoke to the [place] pulmonologist and was told that exponentially there is no difference in diarrhea between the [place] version of nintedanib and the [place] version of nintedanib.  Diarrhea happens randomly does bother him.  He says his diet has not changed since returning to the [place].  He says he eats the same [place].  He does take sugar with tea in the morning.  In terms of his tachycardia it still is ongoing.  He is never had echocardiogram.  In terms of weight loss he continues to lose weight.  This weight loss started even before he was on nintedanib.  It is still ongoing.  He is lost 10 pounds since June 2021.  He is worried about this.  PFT  OV 09/22/2020  Subjective:  [name], male , DOB: 06/07/1940 , age 69 y.o. , MRN: [id number] , ADDRESS: [address]  [place] [address] PCP Gaynelle Arabian, MD Patient Care Team: Gaynelle Arabian, MD as PCP - General (Family Medicine)  This Provider for this visit: Treatment Team:  Attending Provider: Brand Males, MD    09/22/2020 -   Chief Complaint  Patient presents with   Follow-up    2 mo f/u after PFT. States he is still struggling with diarrhea and losing weight. States his breathing has been stable since last visit.    Idiopathic pulmonary fibrosis on nintedanib 100 mg twice daily since Oct 2021   -last CT September 2021 Weight loss unintentional even before nintedanib Cough Tachycardia NOS  HPI Cleveland G Heidemann 82 y.o. -returns for follow-up of all the above medical issues.  From a respiratory standpoint he continues to be stable.  He had pulmonary function test that shows continued stability/improvement.  His cough continues.  He in fact he tells me that for the last 2 or 3 years mostly when he drinks water or eats food he starts choking and coughing and clears his throat.  He has now seen ENT and the dysphagia evaluation  is set up.  He is continue to lose weight.  In fact he is lost 10 pounds of weight since starting nintedanib.  The weight loss was the even before nintedanib but has accelerated after nintedanib.  He has diarrhea with the nintedanib.  This is on low-dose nintedanib.  Currently is on the [place] version of the nintedanib but he still losing weight and having the same GI symptoms.  In terms of his tachycardia he had echocardiogram and this is normal.  He says his wife is very concerned about his weight loss.  His pants are loose now.      ECHO MAY 2022   IMPRESSIONS     1. Left ventricular ejection fraction, by estimation, is 60 to 65%. The  left ventricle has normal function. The left ventricle has no regional  wall motion abnormalities. Left ventricular diastolic parameters are  indeterminate.   2. Right ventricular systolic function is normal. The right ventricular  size is normal. There is mildly elevated pulmonary artery systolic  pressure. The estimated right ventricular systolic pressure is [id number] mmHg.   3. The mitral valve is normal in structure. No evidence of mitral valve  regurgitation.   4. Tricuspid valve regurgitation is mild to moderate.   5. The aortic valve is tricuspid. Aortic valve regurgitation is not  visualized. No aortic stenosis is present.   6. The inferior vena cava is normal in size with greater than 50%  respiratory variability, suggesting right atrial pressure of 3 mmHg.     OV 01/11/2021  Subjective:  [name], male , DOB: Apr 24, 1941 , age 46 y.o. , MRN: [id number] , ADDRESS: [place] [id number] PCP Gaynelle Arabian, MD Patient Care Team: Gaynelle Arabian, MD as PCP - General (Family Medicine)  This Provider for this visit: Treatment Team:  Attending Provider: Brand Males, MD Type of visit: Telephone/Video Circumstance: COVID-19 national emergency Identification of patient Jeff ELLISTON  with 04/20/1941 and MRN [id number] - 2 person identifier Risks: Risks, benefits, limitations of telephone visit explained. Patient understood and verbalized agreement to proceed Anyone else on call: no one other than patient and MD Patient location: patient home This provider location: [address]; [place], [place], [place]   01/11/2021 -  IPF followup   Idiopathic pulmonary fibrosis on nintedanib 100 mg twice daily since Oct 2021   -last CT September 2021 Weight loss unintentional even before nintedanib Cough Tachycardia NOS  HPI Jeff Willis 82 y.o. - off ofev since may 2022 Gained weight 5#/ Minimal dyspnea to no dyspnea even with exertion. [place] in interim and had good time. Did water slide. Cough is a bit worse. Othewrise well. Enegery level goodl Though cough is mild - wants something for cough relief thought says medicine from [place] helping.  Plans to go to [place] on February 21, 2021    OV 02/06/2021  Subjective:  [name], male , DOB: [birth year] , age 43 y.o. , MRN: [id number] , ADDRESS: [place] [id number] PCP Gaynelle Arabian, MD Patient Care Team: Gaynelle Arabian, MD as PCP - General (Family Medicine)  This Provider for this visit: Treatment Team:  Attending Provider: Brand Males, MD    02/06/2021 -   Chief Complaint  Patient presents with   Follow-up    Esbriet follow up, cough in the morning    Idiopathic pulmonary fibrosis on nintedanib 100 mg twice daily since Oct 2021   -last CT September 2022, pft oct 2022 -> progression on CT scan  -Stop nintedanib September 2022 due to weight loss and intolerance  -Started pirfenidone October 2022 Weight loss unintentional even before nintedanib  -Improved September/October 2022 after  stopping nintedanib Cough and Dysphagia  -Normal endoscopy with Dr. Collene Willis September 2021/October 2021  -Abnormal barium study September 2022  HPI Amit MORGON DALEIDEN 82 y.o. -returns for follow-up.  This is a head of his trip to [place] February 21, 2021.  He will only be back after 6 months in March /April 2023.  He tells me overall he stable.  His symptom score is stable.  Very little to no dyspnea.  He continues to have dysphagia and his chronic cough.  A year ago his endoscopy was normal.  He saw ENT because of his cough they did a barium study.  As barium study was done end of September 2022 and the suggestion of cervical web on the barium study.  He asked me to call Dr. Collene Willis his GI.  I called her.  She said she will work him in.  He might need another endoscopy to rule out cervical web.  We discussed cervical web with the help of Google images.  Currently he is only bothered by cough especially early in the morning but this cough is stable as can be seen in the symptom score.  There is no dyspnea.  He came back from [place].  He plans to go to [place] February 21, 2021 and will stay there through March April 2023.  He is agreed for a telephone visit while in [place].  He has a pulmonologist in [place].  His walking desaturation test is stable except for mild tachycardia.  No shortness of breath.  He says he had liver function test through his primary care and this was normal.  He does not want to be checked today.    OV 09/07/2021  Subjective:  [name], male , DOB: 1940-11-06 , age 2 y.o. , MRN: [id number] , ADDRESS: [place] [id number] PCP Gaynelle Arabian, MD Patient Care Team: Gaynelle Arabian, MD as PCP - General (Family Medicine)  This Provider for this visit: Treatment Team:  Attending Provider: Brand Males, MD    09/07/2021 -   Chief Complaint  Patient presents with   Follow-up    PFT performed today.      HPI Ehrenfeld 82 y.o. -returns for follow-up.  Last seen in October 2022.  Then in November 2023 went to [place] to [place].  He returned in March 2023.  While in [place] he says  overall he did not have any problems.  He was continuing his pirfenidone.  However he lost his weight.  He did then pause his pirfenidone for 3 weeks and his weight improved.  Then after coming here he started losing weight again.  He could not figure out if it is because of dysphagia which had also started versus pirfenidone.  He then has reduced his pirfenidone to 2 pills 3 times daily.  At this point in time is not interested in clinical trials.  He says he wants to avoid many medications.  Simultaneously has been dealing with dysphagia particular with solids and spices.  Also hard food.  He is clearing his throat quite frequently although he still rates it as level 1 out of 5.  While in [place] May 04, 2021 he had PET scan.  He showed me the images of the PET scan.  We can upload the CD-ROM in the PACS system.  In the entire left lower lobe there is slightly increased uptake for 2.7.  This is consistent with the fibrotic tissue there is also low uptake  in the mediastinal adenopathy which is considered to be reactive.  He did not want any quick follow-up of his CT scan.  He is okay to have a CT scan in September 2023.  He wants avoid radiation exposure.   On August 10, 2021 at [facility] long Dr. Carlean Jews performed status post dilatation of esophageal stenosis.  I reviewed the records.  Of note: He plans to go to [place] in October 2023.  He wants his next follow-up in September 23.  In August 2023 is going with a lot of people to [place]       PFT      [place] 02/25/2022  Subjective:  [name], male , DOB: [birth year] , age 55 y.o. , MRN: [id number] , ADDRESS: [place] [id number] PCP Gaynelle Arabian, MD Patient Care Team: Gaynelle Arabian, MD as PCP - General (Family Medicine)  This Provider for this visit: Treatment Team:  Attending Provider: Brand Males, MD    02/25/2022 -   Chief Complaint  Patient presents with   Follow-up    PFT performed  today.  Pt states he is slowly getting better after recent hospital stay.    HPI [place] 82 y.o. -returns for follow-up.  Over the summer 2023 got hospitalized for COVID-19 related respiratory failure and IPF flareup and ARDS.  He then had a complication by aspiration for which she did not get intubated according to the son.  He then spent time in inpatient rehab and is now home for the last 3 weeks.  At home he is continue to improve.  He is on a chronic prednisone taper with Bactrim.  At this point in time he is not needing oxygen at rest based on home pulse oximetry monitoring.  Here we walked him 5 feet and back on room air and he did not desaturate below 94%.  Recent liver function test normal.  Anemia also improving.   He has had continued weight loss although he has gained weight since the hospitalization compared to prehospitalization this further weight loss to 114 pounds.  Some of this was pirfenidone mediated.  He is now back on the pirfenidone for the last few weeks.  They also feel that some of the weight loss is because he is not able to take enough food because of the dysphagia and the cough.  He continues to be physically deconditioned but even that he has improved.  He is able to do some ADLs.  He is able to take shower with the help of his wife.  He is able to walk within the house although still a little bit wobbly   He now has a chronic Foley which she is not able to get rid of after the hospitalization.  He follows with Dr. Junious Silk neurology.   The main issue right now is his cough and clearing of the throat.  The 2 are slightly unrelated.  He coughs when he eats food.  He also coughs when he clears her throat.  He throats clears his throat independent of the cough.  Eating food makes him clear the throat more. .  At night he does not clear the throat.  He has not seen ENT in 2 years.  There is no history of difficult intubation.  He and her son feel it is related to  his dysphagia issues.  Mid November 2023 he is being scheduled for esophageal dilatation by Dr. Carol Ada.  I did tell them  that the risk for endoscopy is acceptable given the fact he was able to walk 5-10 feet without any desaturation on room air ..  I supported him having the endoscopy.  They believe there might be some sinus drainage.  Did encourage him to try over-the-counter Flonase and also Mucinex.   He did have some back pain in the hospital and was started on gabapentin.  Is clearly not helping his cough or clearing of the throat.  He is back pain is not much of an issue.  We took a shared decision making he can come off this.      04/01/2022 Patient presents today for follow-up.  She has a history of IPF recently diagnosed with community-acquired pneumonia.  Chest x-ray on 03/20/2022 showed increased left lung opacity.  He was treated with liquid Augmentin x 7 days. Cough is worse the last several days. Son reports that he has several distince coughs. No associated shortness of breath. He has new swelling in feet last 4-5 days. Constantly clearing his throat, aggressively trys to cough up mucus but cough is mainly dry. When he lays flat he does not cough.  He is compliant with Esbriet and taking prednisone every other day. No increased oxygen demands. During last office visit with Dr. Chase Caller he was referred to ENT due to cough to rule out postnasal drip as cause. He has not been contacted to schedule visit.  He had an EGD in November which showed esophageal stenosis.  Takes Protonix twice daily, needs refill.       OV 06/20/2022  Subjective:  [name], male , DOB: 03/05/1941 , age 89 y.o. , MRN: [id number] , ADDRESS: [place] [phone] PCP Gaynelle Arabian, MD Patient Care Team: Gaynelle Arabian, MD as PCP - General (Family Medicine) Troy Sine, MD as PCP - Cardiology (Cardiology)  This Provider for this visit: Treatment Team:   Attending Provider: Brand Males, MD   06/20/2022 -visit after exposure to flulike illness.   HPI [place] 82 y.o. -presents with son benign.  This is an acute visit.  He has full schedule regular visit coming up.  Some 3-4 days ago son thought he had the flu.  Initially said he had the flu but later said he did not test himself for the flu but he was flulike illness.  The daughter-in-law who also lives in the house got similar symptoms then 2 days ago patient started with body ache and cough and wet cough that is increased compared to baseline.  The sputum was clear in color and there is no fever or chills.  There is no runny nose.  Currently he is improving actually.  His cough intensity has come down his body ache is come down.  He is to do pulmonary rehabilitation and is not desaturating but he still uses his oxygen for psychological comfort.  He did tell me that when he goes upstairs he goes down to 88-89% but this is baseline.  He does not think he is desaturating currently.  We did a rapid flu test and it was negative.  He is continue to lose weight.  He had endoscopy in November 2023 for esophageal stenosis.  Frequent scheduled endoscopy at [facility] is pending by Dr. Carol Ada.  He is continue to lose weight.      OV 07/02/2022  Subjective:  [name], male , DOB: 1940-05-15 , age 74 y.o. , MRN: [id number] ,  ADDRESS: [address] [place] [id number] PCP Gaynelle Arabian, MD Patient Care Team: Gaynelle Arabian, MD as PCP - General (Family Medicine) Troy Sine, MD as PCP - Cardiology (Cardiology)  This Provider for this visit: Treatment Team:  Attending Provider: Brand Males, MD   Idiopathic pulmonary fibrosis on nintedanib 100 mg twice daily since Oct 2021   -last CT September 2022, pft oct 2022 -> progression on CT scan  -Stop nintedanib September 2022 due to weight loss and intolerance  -Started pirfenidone  October 2022 -> reduced to 2 pills 3 times daily March 2023 due to weight loss  IPF flareup due to COVID-19 summer 2023 followed by aspiration pneumonia and acute respiratory failure needing oxygen and intubation  -History of intubation  - Course complicated by aspiration  -On prednisone therapy since discharge  Weight loss unintentional even before nintedanib  -Improved September/October 2022 after stopping nintedanib  -relapse while in [place] but after starting pirfenidone in the fall 2022 and early 2023.  -Progressive weight loss after summer 2023 hospitalization for COVID-19  - June 2021: 137#  - 114# - oct 2023  - 110# 06/20/2022  -105# 07/02/2022 (son surpise)d    Cough and Dysphagia ad clearing throat  -Normal endoscopy with Dr. Collene Willis September 2021/October 2021  -Abnormal barium study September 2022  -Status post endoscopy with esophageal stenosis dilatation by Dr. Benson Norway April 2023.  -Worse in October 2023 after some hospitalization for COVID   Normal right heart cath July 2023 without pulmonary hypertension.  07/02/2022 -   Chief Complaint  Patient presents with   Follow-up    PFT     HPI Parth[place] 82 y.o. -returns for follow-up.  I recently saw him a few weeks ago because of exposure to flulike illness.  He states he is back to baseline.  However does appear to have lost 5 more pounds of weight in the last 2 weeks.  His son is surprised by this.  Patient does admit to low appetite.  However he is known to deal with esophageal stenosis.  The son did indicate to me at last visit and again this visit that Dr. Carol Ada is considering sequential endoscopy with sequential dilatation at specific interval.  Apparently this has been hard to schedule with the endoscopy suite at [facility] long.  At the request I did reach to Vista Lawman the director for the endoscopy suite to work with Dr. Carol Ada to make sure this was possible because of his ongoing weight loss.  Son is  also not sure whether it is accurate weight loss of 5 pounds in the last 2 weeks.  Therefore I asked them to track dry weight on a scheduled basis at a specific fix time.  We did discuss about the possibility of pirfenidone playing a role in the weight loss.  They are also having a swallowing evaluation coming up at [facility]   He did have lung function test today and overall there is a 35% decline in [facility] compared to May 2023 [his COVID was in July August 2023].  There is at least 14% progression since fall 2023.  They are surprised by the decline in PFT because they said that he is actually doing better his cough is better and his symptom scores are better.  He does have oxygen but he states that rest he does not need it.  Also nighttime he is not using it consistently and does not feel the need to.  They say  when they checked the pulse ox when he is exerting or sitting still it is stable.  Overall son and he were in alignment that ever since COVID in summer 2023 his overall health status is declined.   SYMPTOM SCALE - ILD 10/05/2019 137 pounds 01/17/2020 134 pounds 03/01/2020 131# - start ofev oct 2021 07/24/2020 127# on ofev (nintib from [place] by [facility] x 2 months) '100mg'$  bid 09/22/2020 122# - on ofev '100mg'$  bid 01/11/2021 Tele visit - off ofev  128# not on any antifibrotic 02/06/2021 131# -esbriet x 1 week 09/07/2021 125#  02/25/2022 114#  06/20/2022 110# 07/02/2022 105#  O2 use ra ra ra ra ra  ra   ra  Shortness of Breath 0 -> 5 scale with 5 being worst (score 6 If unable to do)           At rest 0 0 0 0 0  0   0  Simple tasks - showers, clothes change, eating, shaving 0 0 0 1 0  0 0  1  Household (dishes, doing bed, laundry) 0 1 1  0 0  0 1.5  na  Shopping 0 0 0 0   0 nan  na  Walking level at own pace 0 0 0 0 0  0 1  0  Walking up Stairs 1 0 1 0 0  0 2  1  Total (30-36) Dyspnea Score '1 1 2 1 '$ 0  0 3.5  2  How bad is your cough? 2 3 0 '2 2  2 5  '$ 1.5  How bad is your fatigue 0 0 0 0 0  0 4   1.5  How bad is nausea 0 0 0 1 0  0 0  0  How bad is vomiting?  00 0 0 0 0  0 0  0  How bad is diarrhea? 0 0 0 3 3  0 0  0  How bad is anxiety? 00 '1 1 1 '$ 0  1 1.5  1  How bad is depression 0 '1 1 1 '$ 0  1 1.5  0         Simple office walk 185 feet x  3 laps goal with forehead probe 10/05/2019  03/01/2020  07/24/2020  09/22/2020  02/06/2021  09/07/2021   O2 used ra ra  ra ra ra  Number laps completed 3   3    Comments about pace avt   Mod paced avg avg  Resting Pulse Ox/HR 100% and 122/min 98% and 110 1-00% and 120 100% and 109/min 99% and 89 100% and 85  Final Pulse Ox/HR 100% and 143/min 97% and 130 99% and 130 99% and 124/min 99% and 114 98% ad 115  Desaturated </= 88% x x  no no   Desaturated <= 3% points x x  no no   Got Tachycardic >/= 90/min x x  ye yes   Symptoms at end of test x x  none none   Miscellaneous comments x x No dyspnea No dyspnea No dyspnea No dyspnea   CT Chest data  No results found.    PFT     Latest Ref Rng & Units 07/02/2022  02/25/2022    3:12 PM  09/07/2021   10:48 AM 02/05/2021   10:44 AM 09/22/2020    9:50 AM 01/17/2020   10:13 AM 10/06/2019    1:49 PM  PFT Results  FVC-Pre L 1.68L 1.96   2.63  2.67  2.81   2.59  FVC-Predicted Pre % 54% 63   84  85  89  83  78   Pre FEV1/FVC % %  89   85  87  87  86  86   FEV1-Pre L  1.75   2.24  2.33  2.45  2.37  2.24   FEV1-Predicted Pre %  74   95  97  102  95  89   DLCO uncorrected ml/min/mmHg  8.46   12.24  13.45  16.91  13.79  15.70   DLCO UNC% %  31   45  49  62  48  55   DLCO corrected ml/min/mmHg  9.22   12.24  13.45  16.91  13.79  15.70   DLCO COR %Predicted %  34   45  49  62  48  55   DLVA Predicted %  68   72  80  89  81  97        has a past medical history of Back pain, Elevated PSA, Essential tremor, Hypercholesteremia, Neuropathy, Prostate cancer (HCC), and Sinus tachycardia (01/30/2018).   reports that he has never smoked. He has never used smokeless tobacco.  Past Surgical History:   Procedure Laterality Date   COLONOSCOPY WITH PROPOFOL N/A 08/10/2021   Procedure: COLONOSCOPY WITH PROPOFOL;  Surgeon: Carol Ada, MD;  Location: [facility];  Service: Gastroenterology;  Laterality: N/A;   ESOPHAGEAL DILATION  08/10/2021   Procedure: ESOPHAGEAL DILATION;  Surgeon: Carol Ada, MD;  Location: [facility];  Service: Gastroenterology;;   ESOPHAGOGASTRODUODENOSCOPY (EGD) WITH PROPOFOL N/A 08/10/2021   Procedure: ESOPHAGOGASTRODUODENOSCOPY (EGD) WITH PROPOFOL;  Surgeon: Carol Ada, MD;  Location: [facility];  Service: Gastroenterology;  Laterality: N/A;   ESOPHAGOGASTRODUODENOSCOPY (EGD) WITH PROPOFOL N/A 03/12/2022   Procedure: ESOPHAGOGASTRODUODENOSCOPY (EGD) WITH PROPOFOL;  Surgeon: Carol Ada, MD;  Location: [facility];  Service: Gastroenterology;  Laterality: N/A;   HEMOSTASIS CLIP PLACEMENT  08/10/2021   Procedure: HEMOSTASIS CLIP PLACEMENT;  Surgeon: Carol Ada, MD;  Location: [facility];  Service: Gastroenterology;;   LUMBAR MICRODISCECTOMY     L4-5   POLYPECTOMY  08/10/2021   Procedure: POLYPECTOMY;  Surgeon: Carol Ada, MD;  Location: [facility];  Service: Gastroenterology;;   RADIOACTIVE SEED IMPLANT N/A 02/24/2018   Procedure: RADIOACTIVE SEED IMPLANT/BRACHYTHERAPY IMPLANT;  Surgeon: Festus Aloe, MD;  Location: [facility];  Service: Urology;  Laterality: N/A;   RIGHT HEART CATH N/A 11/22/2021   Procedure: RIGHT HEART CATH;  Surgeon: Troy Sine, MD;  Location: [facility];  Service: Cardiovascular;  Laterality: N/A;   SAVORY DILATION N/A 03/12/2022   Procedure: SAVORY DILATION;  Surgeon: Carol Ada, MD;  Location: [facility];  Service: Gastroenterology;  Laterality: N/A;   SPACE OAR INSTILLATION N/A 02/24/2018   Procedure: SPACE OAR INSTILLATION;  Surgeon: Festus Aloe, MD;  Location: [facility];  Service: Urology;  Laterality: N/A;    Allergies  Allergen Reactions   Beef-Derived  Products Other (See Comments)    Vegetarian   Chicken Protein Other (See Comments)    Vegetarian   Eggs Or Egg-Derived Products Other (See Comments)    Vegetarian   Fish-Derived Products Other (See Comments)    Vegetarian   Meat Extract Other (See Comments)    Vegetarian   Ofev [Nintedanib] Other (See Comments)    Weight loss   Pork-Derived Products Other (See Comments)    Vegetarian    Immunization History  Administered Date(s) Administered   Influenza, High Dose Seasonal PF  01/28/2019, 01/12/2021, 02/11/2022   Influenza-Unspecified 01/05/2020   PFIZER(Purple Top)SARS-COV-2 Vaccination 07/23/2019, 08/17/2019   Pneumococcal-Unspecified 02/28/2015   Zoster, Live 01/26/2021    Family History  Problem Relation Age of Onset   Other Mother        died at 53 of natural causes   Other Father        unsure of health   Cancer Neg Hx      Current Outpatient Medications:    aspirin EC 81 MG tablet, Take 81 mg by mouth daily. Swallow whole., Disp: , Rfl:    chlorpheniramine-HYDROcodone (TUSSIONEX) 10-8 MG/5ML, Take 5 mLs by mouth every 12 (twelve) hours as needed for cough., Disp: 300 mL, Rfl: 0   famotidine (PEPCID) 40 MG tablet, Take 40 mg by mouth at bedtime., Disp: , Rfl:    furosemide (LASIX) 20 MG tablet, Take 1 tablet (20 mg total) by mouth daily as needed for edema., Disp: 30 tablet, Rfl: 1   melatonin 3 MG TABS tablet, Take 1 tablet (3 mg total) by mouth at bedtime as needed., Disp: , Rfl: 0   metoprolol succinate (TOPROL-XL) 50 MG 24 hr tablet, Take 1.5 tablets (75 mg total) by mouth daily. Take with or immediately following a meal., Disp: 135 tablet, Rfl: 3   Multiple Vitamin (MULTIVITAMIN WITH MINERALS) TABS tablet, Take 1 tablet by mouth daily., Disp: , Rfl:    pantoprazole (PROTONIX) 40 MG tablet, Take 1 tablet (40 mg total) by mouth 2 (two) times daily., Disp: 30 tablet, Rfl: 2   Pirfenidone 267 MG CAPS, Take 534 mg by mouth in the morning, at noon, and at bedtime.,  Disp: , Rfl:    rosuvastatin (CRESTOR) 10 MG tablet, TAKE ONE TABLET BY MOUTH DAILY, Disp: 90 tablet, Rfl: 3   Vitamin D, Ergocalciferol, (DRISDOL) 1.25 MG (50000 UNIT) CAPS capsule, Take 1 capsule (50,000 Units total) by mouth every 7 (seven) days., Disp: 5 capsule, Rfl: 0      Objective:   Vitals:   07/02/22 1300  BP: 120/80  Pulse: 83  SpO2: 97%  Weight: 105 lb 3.2 oz (47.7 kg)  Height: '5\' 5"'$  (1.651 m)    Estimated body mass index is 17.51 kg/m as calculated from the following:   Height as of this encounter: '5\' 5"'$  (1.651 m).   Weight as of this encounter: 105 lb 3.2 oz (47.7 kg).  '@WEIGHTCHANGE'$ @  [employer]   07/02/22 1300  Weight: 105 lb 3.2 oz (47.7 kg)     Physical Exam General: No distress. Thin. Looks well but deconditioned and frail Neuro: Alert and Oriented x 3. GCS 15. Speech normal Psych: Pleasant Resp:  Barrel Chest - no.  Wheeze - no, Crackles - yes, No overt respiratory distress CVS: Normal heart sounds. Murmurs - n Ext: Stigmata of Connective Tissue Disease - no HEENT: Normal upper airway. PEERL +. No post nasal drip        Assessment:       ICD-10-CM   1. IPF (idiopathic pulmonary fibrosis) ([place])  J84.112     2. Interstitial lung disease (HCC)  J84.9 Pulmonary function test    3. Esophageal stenosis  K22.2     4. Physical deconditioning  R53.81     5. Weight loss, non-intentional  R63.4          Plan:     Patient Instructions  IPF (idiopathic pulmonary fibrosis) (HCC) Chronic respiratory failure after COVID-19 related hospitalization summer 2023   -PFT decreased 36% between MAy 2023 ->  March 2024; continuied progrssive worsening but odd you are able to keep o2 levels [place]. STill concern is disease progression   Plan -continue esbriet 2 pills three times daily with food (see disussion on weight loss below) --Monitor oxygen levels  -Goal pulse ox should be greater than 92%  -For now continue to use oxygen at night regardless of  pulse ox -LFT test next visit   Weight loss, non-intentional  - losing weight again with esbriet xsince  02/06/2021 and subsequent weight loss in October 2023 to 114# following hospitalization and now 105# in March 2024 - could be esbiriet v  swallowing isues   Plan - check weight at home (naked weight at same time daily) -monitor for weight loss with esbriet for now -Continue high-protein diet as below - complete GI workup (below)  COUGH and DYSPHAGIA and clearing throat New History of intubation - summer 2023  - likely due to IPF and cough neuropathy but dysphagia (esophageal stenosis) probably playing a role - 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  - s/p dilatation by Dr Benson Norway April 2023 - symptoms worse after COVID-related respiratory failure and hospitalization  -Possibly because the cervical web is worse but need to rule out postintubation laryngeal injury   Plan - agree with Dr Benson Norway trying to get sequential dilatation ; will email the endoscopy folks   Physical deconditioning - new since summer 2023  -Much improved after hospitalization and inpatient rehab but still deconditioned compared to baseline  Plan  - Continue home physical therapy -Continue strengthening exercises -Continue high-protein vegetarian diet through legumes and tofu   Follow-up -12 weeks do spirometry and DLCO and return to see Dr. Chase Caller for a 30-minute  - symptoms score and walk test on RA at followup   SIGNATURE    Dr. Brand Males, M.D., F.C.C.P,  Pulmonary and Critical Care Medicine Staff Physician, [place] Director - Interstitial Lung Disease  Program  Pulmonary [place] at [place], [place], [address]  Pager: [phone], If no answer or between  15:00h - 7:00h: call [phone] Telephone: [phone]  1:59 PM 07/02/2022

## 2022-07-02 NOTE — Progress Notes (Signed)
Full PFT performed today. °

## 2022-07-03 ENCOUNTER — Other Ambulatory Visit: Payer: Self-pay | Admitting: Pharmacist

## 2022-07-03 DIAGNOSIS — J84112 Idiopathic pulmonary fibrosis: Secondary | ICD-10-CM

## 2022-07-03 MED ORDER — PIRFENIDONE 267 MG PO CAPS: 534.0000 mg | ORAL_CAPSULE | Freq: Three times a day (TID) | ORAL | 1 refills | Status: DC

## 2022-07-03 NOTE — Telephone Encounter (Signed)
Refill sent for ESBRIET to [facility] ([facility]) for Esbriet: [phone]  Dose: 534 mg three times daily  Last OV: 07/02/22 Provider: Dr. Chase Caller  Next OV: 3 months (PFTs scheduled for 09/02/22)  LFTs on 05/10/22 wnl  Knox Saliva, PharmD, MPH, BCPS Clinical Pharmacist (Rheumatology and Pulmonology)

## 2022-07-07 ENCOUNTER — Other Ambulatory Visit: Payer: Self-pay | Admitting: Cardiovascular Disease

## 2022-07-09 ENCOUNTER — Telehealth (HOSPITAL_BASED_OUTPATIENT_CLINIC_OR_DEPARTMENT_OTHER): Payer: Self-pay

## 2022-07-09 ENCOUNTER — Telehealth: Payer: Self-pay | Admitting: Cardiovascular Disease

## 2022-07-09 DIAGNOSIS — Z8679 Personal history of other diseases of the circulatory system: Secondary | ICD-10-CM

## 2022-07-09 DIAGNOSIS — I2510 Atherosclerotic heart disease of native coronary artery without angina pectoris: Secondary | ICD-10-CM

## 2022-07-09 DIAGNOSIS — R000 Tachycardia, unspecified: Secondary | ICD-10-CM

## 2022-07-09 MED ORDER — METOPROLOL SUCCINATE ER 50 MG PO TB24: 50.0000 mg | ORAL_TABLET | Freq: Every day | ORAL | 3 refills | Status: DC

## 2022-07-09 MED ORDER — METOPROLOL SUCCINATE ER 25 MG PO TB24: 25.0000 mg | ORAL_TABLET | Freq: Every day | ORAL | 3 refills | Status: DC

## 2022-07-09 NOTE — Addendum Note (Signed)
Addended by: Gerald Stabs on: 07/09/2022 03:14 PM   Modules accepted: Orders

## 2022-07-09 NOTE — Telephone Encounter (Signed)
Returned call to patient's son and we will try the '50mg'$  and '25mg'$  tablets to pharm. Explained they would need to be run through insurance to see if they will approve both tablets. Son explains that patient is having another esophagus procedure Friday which will hopefully help his swallowing. Reviewed heart monitor results and assured son that EP scheduling would reach out to get them scheduled. He notes he actually just missed a call from them and will given them a callback!

## 2022-07-09 NOTE — Telephone Encounter (Signed)
Spoke with patient's son and he states that Terie Purser NP at [facility] increased his metoprolol succinate from '50mg'$  to '75mg'$   daily. He stated that patient can not swallow the half tablet. He does ok swallowing the whole pills but the half pill he is having trouble with. He stated he had '25mg'$  whole tablet so he was giving him a '25mg'$  tablet. and the '50mg'$  tablet to equal '75mg'$ . He would like to know if he can get new Rx for the '25mg'$  tablets of metoprolol succinate with instructions to take with '50mg'$  tablet.   He also stated that he saw a message on mychart and stated patient needs to see EP provider and wanted to know do he  all to set that appointment up or will we give him a call?

## 2022-07-09 NOTE — Telephone Encounter (Addendum)
Seen by patient Jeff Willis on 07/08/2022 12:30 PM; referral placed.    ----- Message from Loel Dubonnet, NP sent at 07/08/2022 11:16 AM EDT ----- Monitor with predominantly normal sinus rhythm. There were frequent short episodes of SVT (supraventricular tachycardia) which is a fast heart beat originating in the top chambers of the heart. However, there were a few longer episodes.   Given frequent episodes, recommend referral to EP (electrophysiology). Continue Toprol '75mg'$  daily.

## 2022-07-09 NOTE — Telephone Encounter (Signed)
Pt c/o medication issue:  1. Name of Medication:   metoprolol succinate (TOPROL-XL) 50 MG 24 hr tablet    2. How are you currently taking this medication (dosage and times per day)? Take 1.5 tablets (75 mg total) by mouth daily. Take with or immediately following a meal.   3. Are you having a reaction (difficulty breathing--STAT)? No  4. What is your medication issue? Pt's son would like a callback regarding needing a separate '25mg'$  tablet prescription sent to pharmacy due to pt not being able to take half of a tablet since he has to take 1.5 tablets.    Pt's son would also like a callback regarding Heart monitor results, he stated he never heard anything back.

## 2022-07-10 ENCOUNTER — Other Ambulatory Visit (HOSPITAL_BASED_OUTPATIENT_CLINIC_OR_DEPARTMENT_OTHER): Payer: Self-pay

## 2022-07-10 ENCOUNTER — Encounter (HOSPITAL_COMMUNITY): Payer: Self-pay | Admitting: Gastroenterology

## 2022-07-10 DIAGNOSIS — R7989 Other specified abnormal findings of blood chemistry: Secondary | ICD-10-CM

## 2022-07-10 LAB — THYROID PANEL WITH TSH
Free Thyroxine Index: 1.8 (ref 1.2–4.9)
T3 Uptake Ratio: 28 % (ref 24–39)
T4, Total: 6.6 ug/dL (ref 4.5–12.0)
TSH: 9.91 u[IU]/mL — ABNORMAL HIGH (ref 0.450–4.500)

## 2022-07-12 ENCOUNTER — Ambulatory Visit (HOSPITAL_BASED_OUTPATIENT_CLINIC_OR_DEPARTMENT_OTHER): Payer: Medicare Other | Admitting: Anesthesiology

## 2022-07-12 ENCOUNTER — Encounter (HOSPITAL_COMMUNITY): Payer: Self-pay | Admitting: Gastroenterology

## 2022-07-12 ENCOUNTER — Ambulatory Visit (HOSPITAL_COMMUNITY)
Admission: RE | Admit: 2022-07-12 | Discharge: 2022-07-12 | Disposition: A | Discharge status: Home / Self Care | Payer: Medicare Other | Attending: Gastroenterology | Admitting: Gastroenterology

## 2022-07-12 ENCOUNTER — Other Ambulatory Visit: Payer: Self-pay

## 2022-07-12 ENCOUNTER — Encounter (HOSPITAL_COMMUNITY)
Admission: RE | Disposition: A | Discharge status: Home / Self Care | Payer: Self-pay | Source: Home / Self Care | Attending: Gastroenterology

## 2022-07-12 ENCOUNTER — Ambulatory Visit (HOSPITAL_COMMUNITY): Payer: Medicare Other | Admitting: Anesthesiology

## 2022-07-12 DIAGNOSIS — J849 Interstitial pulmonary disease, unspecified: Secondary | ICD-10-CM | POA: Insufficient documentation

## 2022-07-12 DIAGNOSIS — K222 Esophageal obstruction: Secondary | ICD-10-CM | POA: Diagnosis not present

## 2022-07-12 DIAGNOSIS — R1310 Dysphagia, unspecified: Secondary | ICD-10-CM | POA: Diagnosis present

## 2022-07-12 HISTORY — PX: SAVORY DILATION: SHX5439

## 2022-07-12 HISTORY — PX: ESOPHAGOGASTRODUODENOSCOPY (EGD) WITH PROPOFOL: SHX5813

## 2022-07-12 SURGERY — ESOPHAGOGASTRODUODENOSCOPY (EGD) WITH PROPOFOL
Anesthesia: Monitor Anesthesia Care

## 2022-07-12 MED ORDER — SODIUM CHLORIDE 0.9 % IV SOLN: INTRAVENOUS | Status: DC

## 2022-07-12 MED ORDER — LACTATED RINGERS IV SOLN: INTRAVENOUS | Status: DC

## 2022-07-12 MED ORDER — LIDOCAINE 2% (20 MG/ML) 5 ML SYRINGE
INTRAMUSCULAR | Status: DC | PRN
  Administered 2022-07-12: 20 mg via INTRAVENOUS
  Administered 2022-07-12: 60 mg via INTRAVENOUS

## 2022-07-12 MED ORDER — PROPOFOL 10 MG/ML IV BOLUS
INTRAVENOUS | Status: DC | PRN
  Administered 2022-07-12: 30 mg via INTRAVENOUS
  Administered 2022-07-12: 20 mg via INTRAVENOUS

## 2022-07-12 SURGICAL SUPPLY — 15 items

## 2022-07-12 NOTE — H&P (Signed)
Jeff Willis HPI: He continues to struggle with dysphagia.  Review of the esophagram and his prior EGD suggests that he will need serial EGD dilations.  Past Medical History:  Diagnosis Date   Back pain    Elevated PSA    Essential tremor    Hypercholesteremia    Neuropathy    Prostate cancer ([place])    Sinus tachycardia 01/30/2018    Past Surgical History:  Procedure Laterality Date   COLONOSCOPY WITH PROPOFOL N/A 08/10/2021   Procedure: COLONOSCOPY WITH PROPOFOL;  Surgeon: Carol Ada, MD;  Location: [facility];  Service: Gastroenterology;  Laterality: N/A;   ESOPHAGEAL DILATION  08/10/2021   Procedure: ESOPHAGEAL DILATION;  Surgeon: Carol Ada, MD;  Location: [facility];  Service: Gastroenterology;;   ESOPHAGOGASTRODUODENOSCOPY (EGD) WITH PROPOFOL N/A 08/10/2021   Procedure: ESOPHAGOGASTRODUODENOSCOPY (EGD) WITH PROPOFOL;  Surgeon: Carol Ada, MD;  Location: [facility];  Service: Gastroenterology;  Laterality: N/A;   ESOPHAGOGASTRODUODENOSCOPY (EGD) WITH PROPOFOL N/A 03/12/2022   Procedure: ESOPHAGOGASTRODUODENOSCOPY (EGD) WITH PROPOFOL;  Surgeon: Carol Ada, MD;  Location: [facility];  Service: Gastroenterology;  Laterality: N/A;   HEMOSTASIS CLIP PLACEMENT  08/10/2021   Procedure: HEMOSTASIS CLIP PLACEMENT;  Surgeon: Carol Ada, MD;  Location: [facility];  Service: Gastroenterology;;   LUMBAR MICRODISCECTOMY     L4-5   POLYPECTOMY  08/10/2021   Procedure: POLYPECTOMY;  Surgeon: Carol Ada, MD;  Location: [facility];  Service: Gastroenterology;;   RADIOACTIVE SEED IMPLANT N/A 02/24/2018   Procedure: RADIOACTIVE SEED IMPLANT/BRACHYTHERAPY IMPLANT;  Surgeon: Festus Aloe, MD;  Location: [facility];  Service: Urology;  Laterality: N/A;   RIGHT HEART CATH N/A 11/22/2021   Procedure: RIGHT HEART CATH;  Surgeon: Troy Sine, MD;  Location: [place] [facility];  Service: Cardiovascular;  Laterality: N/A;   SAVORY DILATION N/A  03/12/2022   Procedure: SAVORY DILATION;  Surgeon: Carol Ada, MD;  Location: [facility];  Service: Gastroenterology;  Laterality: N/A;   SPACE OAR INSTILLATION N/A 02/24/2018   Procedure: SPACE OAR INSTILLATION;  Surgeon: Festus Aloe, MD;  Location: [facility];  Service: Urology;  Laterality: N/A;    Family History  Problem Relation Age of Onset   Other Mother        died at 8 of natural causes   Other Father        unsure of health   Cancer Neg Hx     Social History:  reports that he has never smoked. He has never used smokeless tobacco. He reports that he does not drink alcohol and does not use drugs.  Allergies:  Allergies  Allergen Reactions   Beef-Derived Products Other (See Comments)    Vegetarian   Chicken Protein Other (See Comments)    Vegetarian   Egg-Derived Products Other (See Comments)    Vegetarian   Fish-Derived Products Other (See Comments)    Vegetarian   Meat Extract Other (See Comments)    Vegetarian   Ofev [Nintedanib] Other (See Comments)    Weight loss   Pork-Derived Products Other (See Comments)    Vegetarian    Medications: Scheduled: Continuous:  sodium chloride     lactated ringers      No results found for this or any previous visit (from the past 24 hour(s)).   No results found.  ROS:  As stated above in the HPI otherwise negative.  Blood pressure (!) 170/99, pulse 68, temperature (!) 97.5 F (36.4 C), temperature source Tympanic, resp. rate 12, height 5\' 5"  (1.651  m), weight 47.7 kg, SpO2 97 %.    PE: Gen: NAD, Alert and Oriented HEENT:  [place]/AT, EOMI Neck: Supple, no LAD Lungs: CTA Bilaterally CV: RRR without M/G/R ABD: Soft, NTND, +BS Ext: No C/C/E  Assessment/Plan: 1) Esophageal stricture - EGD with dilation.  Kailana Benninger D 07/12/2022, 7:36 AM

## 2022-07-12 NOTE — Anesthesia Preprocedure Evaluation (Signed)
Anesthesia Evaluation  Patient identified by MRN, date of birth, ID band Patient awake    Reviewed: Allergy & Precautions, H&P , NPO status , Patient's Chart, lab work & pertinent test results  Airway Mallampati: II  TM Distance: >3 FB Neck ROM: Full    Dental no notable dental hx.    Pulmonary  ILD   Pulmonary exam normal breath sounds clear to auscultation       Cardiovascular negative cardio ROS Normal cardiovascular exam Rhythm:Regular Rate:Normal     Neuro/Psych negative neurological ROS  negative psych ROS   GI/Hepatic Neg liver ROS,GERD  Medicated,,  Endo/Other  negative endocrine ROS    Renal/GU negative Renal ROS  negative genitourinary   Musculoskeletal negative musculoskeletal ROS (+)    Abdominal   Peds negative pediatric ROS (+)  Hematology negative hematology ROS (+)   Anesthesia Other Findings   Reproductive/Obstetrics negative OB ROS                             Anesthesia Physical Anesthesia Plan  ASA: 3  Anesthesia Plan: MAC   Post-op Pain Management: Minimal or no pain anticipated   Induction: Intravenous  PONV Risk Score and Plan: 1 and Propofol infusion and Treatment may vary due to age or medical condition  Airway Management Planned: Simple Face Mask  Additional Equipment:   Intra-op Plan:   Post-operative Plan:   Informed Consent: I have reviewed the patients History and Physical, chart, labs and discussed the procedure including the risks, benefits and alternatives for the proposed anesthesia with the patient or authorized representative who has indicated his/her understanding and acceptance.     Dental advisory given  Plan Discussed with: CRNA and Surgeon  Anesthesia Plan Comments:        Anesthesia Quick Evaluation

## 2022-07-12 NOTE — Anesthesia Postprocedure Evaluation (Signed)
Anesthesia Post Note  Patient: Jeff Willis  Procedure(s) Performed: ESOPHAGOGASTRODUODENOSCOPY (EGD) WITH PROPOFOL SAVORY DILATION     Patient location during evaluation: PACU Anesthesia Type: MAC Level of consciousness: awake and alert Pain management: pain level controlled Vital Signs Assessment: post-procedure vital signs reviewed and stable Respiratory status: spontaneous breathing, nonlabored ventilation, respiratory function stable and patient connected to nasal cannula oxygen Cardiovascular status: stable and blood pressure returned to baseline Postop Assessment: no apparent nausea or vomiting Anesthetic complications: no  No notable events documented.  Last Vitals:  Vitals:   07/12/22 0830 07/12/22 0840  BP: 112/72 130/63  Pulse: 62 (!) 58  Resp: 13 (!) 23  Temp:    SpO2: 100% 100%    Last Pain:  Vitals:   07/12/22 0840  TempSrc:   PainSc: 0-No pain                 Shukri Nistler S

## 2022-07-12 NOTE — Discharge Instructions (Signed)

## 2022-07-12 NOTE — Transfer of Care (Signed)
Immediate Anesthesia Transfer of Care Note  Patient: Jeff Willis  Procedure(s) Performed: ESOPHAGOGASTRODUODENOSCOPY (EGD) WITH PROPOFOL SAVORY DILATION  Patient Location: PACU  Anesthesia Type:MAC  Level of Consciousness: drowsy and patient cooperative  Airway & Oxygen Therapy: Patient Spontanous Breathing and Patient connected to face mask oxygen  Post-op Assessment: Report given to RN and Post -op Vital signs reviewed and stable  Post vital signs: Reviewed and stable  Last Vitals:  Vitals Value Taken Time  BP    Temp    Pulse 59 07/12/22 0818  Resp 18 07/12/22 0818  SpO2 100 % 07/12/22 0818  Vitals shown include unvalidated device data.  Last Pain:  Vitals:   07/12/22 0734  TempSrc: Tympanic  PainSc: 0-No pain         Complications: No notable events documented.

## 2022-07-12 NOTE — Op Note (Signed)
[facility] Patient Name: Jeff Willis Procedure Date: 07/12/2022 MRN: [id number] Attending MD: Carol Ada , MD, [id number] Date of Birth: 08/19/1940 CSN: [id number] Age: 82 Admit Type: Outpatient Procedure:                Upper GI endoscopy Indications:              Dysphagia Providers:                Carol Ada, MD, Adah Perl RN, RN, Cherylynn Ridges, Rockney Ghee CRNA Referring MD:             Carol Ada, MD Medicines:                Propofol per Anesthesia Complications:            No immediate complications. Estimated Blood Loss:     Estimated blood loss: none. Procedure:                Pre-Anesthesia Assessment:                           - Prior to the procedure, a History and Physical                            was performed, and patient medications and                            allergies were reviewed. The patient's tolerance of                            previous anesthesia was also reviewed. The risks                            and benefits of the procedure and the sedation                            options and risks were discussed with the patient.                            All questions were answered, and informed consent                            was obtained. Prior Anticoagulants: The patient has                            taken no anticoagulant or antiplatelet agents. ASA                            Grade Assessment: III - A patient with severe                            systemic disease. After reviewing the risks and  benefits, the patient was deemed in satisfactory                            condition to undergo the procedure.                           - Sedation was administered by an anesthesia                            professional. Deep sedation was attained.                           After obtaining informed consent, the endoscope was                             passed under direct vision. Throughout the                            procedure, the patient's blood pressure, pulse, and                            oxygen saturations were monitored continuously. The                            [id number]-[id number] Olympus endoscope was introduced                            through the mouth, and advanced to the second part                            of duodenum. The upper GI endoscopy was                            accomplished without difficulty. The patient                            tolerated the procedure well. Scope In: Scope Out: Findings:      One benign-appearing, intrinsic mild stenosis was found at the       cricopharyngeus. This stenosis measured 1.2 cm (inner diameter) x 2 cm       (in length). The stenosis was traversed. A guidewire was placed and the       scope was withdrawn. Dilation was performed with a Savary dilator with       no resistance at 15 mm. The dilation site was examined following       endoscope reinsertion and showed complete resolution of luminal       narrowing. Estimated blood loss was minimal.      The stomach was normal.      The examined duodenum was normal. Impression:               - Benign-appearing esophageal stenosis. Dilated.                           - Normal stomach.                           -  Normal examined duodenum.                           - No specimens collected. Moderate Sedation:      Not Applicable - Patient had care per Anesthesia. Recommendation:           - Patient has a contact number available for                            emergencies. The signs and symptoms of potential                            delayed complications were discussed with the                            patient. Return to normal activities tomorrow.                            Written discharge instructions were provided to the                            patient.                           - Resume previous diet.                            - Continue present medications.                           - Repeat EGD with dilation as scheduled. Procedure Code(s):        --- Professional ---                           [phone], Esophagogastroduodenoscopy, flexible,                            transoral; with insertion of guide wire followed by                            passage of dilator(s) through esophagus over guide                            wire Diagnosis Code(s):        --- Professional ---                           K22.2, Esophageal obstruction                           R13.10, Dysphagia, unspecified CPT copyright 2022 American [employer] Association. All rights reserved. The codes documented in this report are preliminary and upon coder review may  be revised to meet current compliance requirements. Carol Ada, MD Carol Ada, MD 07/12/2022 8:21:15 AM This report has been signed electronically. Number of Addenda: 0

## 2022-07-16 ENCOUNTER — Other Ambulatory Visit: Payer: Self-pay | Admitting: Gastroenterology

## 2022-07-22 LAB — PULMONARY FUNCTION TEST
FEF 25-75 Post: 1.9 L/sec
FEF 25-75 Pre: 2.68 L/sec
FEF2575-%Change-Post: -29 %
FEF2575-%Pred-Post: 111 %
FEF2575-%Pred-Pre: 156 %
FEV1-%Change-Post: -7 %
FEV1-%Pred-Post: 63 %
FEV1-%Pred-Pre: 68 %
FEV1-Post: 1.47 L
FEV1-Pre: 1.58 L
FEV1FVC-%Change-Post: -2 %
FEV1FVC-%Pred-Pre: 124 %
FEV6-%Change-Post: -4 %
FEV6-%Pred-Post: 51 %
FEV6-%Pred-Pre: 54 %
FEV6-Post: 1.6 L
FEV6-Pre: 1.68 L
FEV6FVC-%Pred-Post: 100 %
FEV6FVC-%Pred-Pre: 100 %
FVC-%Change-Post: -4 %
FVC-%Pred-Post: 51 %
FVC-%Pred-Pre: 54 %
FVC-Post: 1.6 L
FVC-Pre: 1.68 L
Post FEV1/FVC ratio: 92 %
Post FEV6/FVC ratio: 100 %
Pre FEV1/FVC ratio: 94 %
Pre FEV6/FVC Ratio: 100 %
RV % pred: 74 %
RV: 1.83 L
TLC % pred: 56 %
TLC: 3.54 L

## 2022-08-02 ENCOUNTER — Encounter (HOSPITAL_COMMUNITY): Payer: Self-pay | Admitting: Gastroenterology

## 2022-08-07 NOTE — Progress Notes (Signed)
Called patient's son for pre op for father's procedure scheduled for 4/12, per son he was going to call Dr Rolla Etienne office to see if still needed procedure done. Based on some new findings didn't think he still needed procedure. Called office to check if he called them, and Morrie Sheldon confirmed he is cancelled for 4/12, will remove from schedule.

## 2022-08-09 ENCOUNTER — Ambulatory Visit (HOSPITAL_COMMUNITY): Admission: RE | Admit: 2022-08-09 | Payer: Medicare Other | Source: Home / Self Care | Admitting: Gastroenterology

## 2022-08-09 ENCOUNTER — Encounter (HOSPITAL_COMMUNITY): Admission: RE | Payer: Self-pay | Source: Home / Self Care

## 2022-08-09 SURGERY — ESOPHAGOGASTRODUODENOSCOPY (EGD) WITH PROPOFOL
Anesthesia: Monitor Anesthesia Care

## 2022-08-13 ENCOUNTER — Encounter: Payer: Self-pay | Admitting: Cardiovascular Disease

## 2022-08-13 ENCOUNTER — Ambulatory Visit: Payer: Medicare Other | Attending: Cardiovascular Disease | Admitting: Cardiovascular Disease

## 2022-08-13 VITALS — BP 107/72 | HR 111 | Ht 65.0 in | Wt 106.0 lb

## 2022-08-13 DIAGNOSIS — I491 Atrial premature depolarization: Secondary | ICD-10-CM

## 2022-08-13 DIAGNOSIS — I4719 Other supraventricular tachycardia: Secondary | ICD-10-CM | POA: Diagnosis not present

## 2022-08-13 NOTE — Patient Instructions (Signed)
Medication Instructions:  Your physician recommends that you continue on your current medications as directed. Please refer to the Current Medication list given to you today. *If you need a refill on your cardiac medications before your next appointment, please call your pharmacy*   Follow-Up: At [facility], you and your health needs are our priority.  As part of our continuing mission to provide you with exceptional heart care, we have created designated Provider Care Teams.  These Care Teams include your primary Cardiologist (physician) and Advanced Practice Providers (APPs -  Physician Assistants and Nurse Practitioners) who all work together to provide you with the care you need, when you need it.  We recommend signing up for the patient portal called "MyChart".  Sign up information is provided on this After Visit Summary.  MyChart is used to connect with patients for Virtual Visits (Telemedicine).  Patients are able to view lab/test results, encounter notes, upcoming appointments, etc.  Non-urgent messages can be sent to your provider as well.   To learn more about what you can do with MyChart, go to [email or web address].    Your next appointment:   1 year(s)  Provider:   Augustus Mealor, MD  

## 2022-08-13 NOTE — Progress Notes (Signed)
Electrophysiology Office Note:    Date:  08/13/2022   ID:  Jeff Willis, DOB [birth year], MRN [id number]  PCP:  Blair Heys, MD   [facility] Providers Cardiologist:  Nicki Guadalajara, MD Electrophysiologist:  Maurice Small, MD     Referring MD: Alver Sorrow, NP   History of Present Illness:    Jeff Willis is a 82 y.o. male with a hx listed below, significant for Nonobstructive CAD, ascending aortic aneurysm, IPF, referred for arrhythmia management.  He has been seeing Dr. Tresa Endo since 8/22 for sinus tachycardia. EF was essentially normal. CT 2021 showed ILD. Coronary calcium score very low, with mild dilation of ascending aorta.  He was noted to have elevated heart rates (~125 bpm) on presenting to cardiac rehab in January. He was asymptomatic. He was taking metoprolol  at the time.  He saw Gillian Shields in clinic 05/10/22. ECG at that time appeared to show an atrial tachycardia with a rate of about 129 bpm. The p-wave is quite narrow and has a superiorly-directed axis. Toprol was increased to   Past Medical History:  Diagnosis Date   Back pain    Elevated PSA    Essential tremor    Hypercholesteremia    Neuropathy    Prostate cancer    Sinus tachycardia 01/30/2018    Past Surgical History:  Procedure Laterality Date   COLONOSCOPY WITH PROPOFOL N/A 08/10/2021   Procedure: COLONOSCOPY WITH PROPOFOL;  Surgeon: Jeani Hawking, MD;  Location: [facility];  Service: Gastroenterology;  Laterality: N/A;   ESOPHAGEAL DILATION  08/10/2021   Procedure: ESOPHAGEAL DILATION;  Surgeon: Jeani Hawking, MD;  Location: [facility];  Service: Gastroenterology;;   ESOPHAGOGASTRODUODENOSCOPY (EGD) WITH PROPOFOL N/A 08/10/2021   Procedure: ESOPHAGOGASTRODUODENOSCOPY (EGD) WITH PROPOFOL;  Surgeon: Jeani Hawking, MD;  [facility]: [facility];  Service: Gastroenterology;  Laterality: N/A;   ESOPHAGOGASTRODUODENOSCOPY (EGD) WITH PROPOFOL N/A 03/12/2022    Procedure: ESOPHAGOGASTRODUODENOSCOPY (EGD) WITH PROPOFOL;  Surgeon: Jeani Hawking, MD;  [facility]: [facility];  Service: Gastroenterology;  Laterality: N/A;   ESOPHAGOGASTRODUODENOSCOPY (EGD) WITH PROPOFOL N/A 07/12/2022   Procedure: ESOPHAGOGASTRODUODENOSCOPY (EGD) WITH PROPOFOL;  Surgeon: Jeani Hawking, MD;  [facility]: [facility];  Service: Gastroenterology;  Laterality: N/A;   HEMOSTASIS CLIP PLACEMENT  08/10/2021   Procedure: HEMOSTASIS CLIP PLACEMENT;  Surgeon: Jeani Hawking, MD;  Location: [facility];  Service: Gastroenterology;;   LUMBAR MICRODISCECTOMY     L4-5   POLYPECTOMY  08/10/2021   Procedure: POLYPECTOMY;  Surgeon: Jeani Hawking, MD;  Location: [facility];  Service: Gastroenterology;;   RADIOACTIVE SEED IMPLANT N/A 02/24/2018   Procedure: RADIOACTIVE SEED IMPLANT/BRACHYTHERAPY IMPLANT;  Surgeon: Jerilee Field, MD;  Location: [facility];  Service: Urology;  Laterality: N/A;   RIGHT HEART CATH N/A 11/22/2021   Procedure: RIGHT HEART CATH;  Surgeon: Lennette Bihari, MD;  Location: [facility];  Service: Cardiovascular;  Laterality: N/A;   SAVORY DILATION N/A 03/12/2022   Procedure: SAVORY DILATION;  Surgeon: Jeani Hawking, MD;  [facility];  Service: Gastroenterology;  Laterality: N/A;   SAVORY DILATION N/A 07/12/2022   Procedure: SAVORY DILATION;  Surgeon: Jeani Hawking, MD;  Location: [facility];  Service: Gastroenterology;  Laterality: N/A;   SPACE OAR INSTILLATION N/A 02/24/2018   Procedure: SPACE OAR INSTILLATION;  Surgeon: Jerilee Field, MD;  Location: [facility];  Service: Urology;  Laterality: N/A;    Current Medications: Current Meds  Medication Sig   aspirin EC 81 MG tablet Take 81 mg by mouth  daily. Swallow whole.   chlorpheniramine-HYDROcodone (TUSSIONEX) 10-8 MG/5ML Take 5 mLs by mouth every 12 (twelve) hours as needed for cough.   melatonin 3 MG TABS tablet Take 1 tablet (3 mg total) by mouth at bedtime  as needed.   metoprolol succinate (TOPROL-XL) 25 MG 24 hr tablet Take 1 tablet (25 mg total) by mouth daily. Take with or immediately following a meal.   metoprolol succinate (TOPROL-XL) 50 MG 24 hr tablet Take 1 tablet (50 mg total) by mouth daily. Take with or immediately following a meal.   Multiple Vitamin (MULTIVITAMIN WITH MINERALS) TABS tablet Take 1 tablet by mouth daily.   OXYGEN Inhale 1-2 L into the lungs continuous.   pantoprazole (PROTONIX) 40 MG tablet Take 1 tablet (40 mg total) by mouth 2 (two) times daily.   Pirfenidone 267 MG CAPS Take 534 mg by mouth in the morning, at noon, and at bedtime.   rosuvastatin (CRESTOR) 10 MG tablet TAKE ONE TABLET BY MOUTH DAILY   Vitamin D, Ergocalciferol, (DRISDOL) 1.25 MG (50000 UNIT) CAPS capsule Take 1 capsule (50,000 Units total) by mouth every 7 (seven) days.     Allergies:   Beef-derived products, Chicken protein, Egg-derived products, Fish-derived products, Meat extract, Ofev [nintedanib], and Pork-derived products   Social and Family History: Reviewed in Epic  ROS:   Please see the history of present illness.    All other systems reviewed and are negative.  EKGs/Labs/Other Studies Reviewed Today:    Echocardiogram:  TTE 01/02/2022 EF 55-60%; normal atrial sizes   Monitors:  ZioXT 05/2022 Sinus rhythm HR 46-97, avg 66 Episodes labeled SVT appear to be atrial fibrillation and flutter, the longest episode was about 45 minutes, rates uncontrolled  Stress testing:   Advanced imaging:     EKG:  Last EKG results: today - atrial tachycardia, 1:1, rate 108 bpm  ECGs reviewed -  ECG from 01/01/2022 shows AT with frequent PACs -- almost AF ECG 11/14/2021 -- normal rhythm  Recent Labs: 04/02/2022: Pro B Natriuretic peptide (BNP) 199.0 05/10/2022: ALT 10; BUN 11; Creatinine, Ser 0.85; Hemoglobin 13.1; Magnesium 2.3; Platelets 231; Potassium 4.6; Sodium 134 07/09/2022: TSH 9.910     Physical Exam:    VS:  BP 107/72   Pulse  (!) 111   Ht 5\' 5"  (1.651 m)   Wt 106 lb (48.1 kg)   SpO2 94% Comment: 1 liter  BMI 17.64 kg/m     Wt Readings from Last 3 Encounters:  08/13/22 106 lb (48.1 kg)  07/12/22 105 lb 2.6 oz (47.7 kg)  07/02/22 105 lb 3.2 oz (47.7 kg)     GEN:  Well nourished, well developed in no acute distress CARDIAC: RRR - heart rate about 65 bpm during my exam, no murmurs, rubs, gallops RESPIRATORY:  Normal work of breathing MUSCULOSKELETAL: no edema    ASSESSMENT & PLAN:    Ectopic atrial tachycardia See ECGs for 01/01/22, 05/10/22, today  He is not symptomatic AT is not incessant, occurs actually fairly infrequently based on monitor. Rates are currently controlled on current dose of metoprolol We discussed options, which would include antiarrhythmic drug, ablation, or rate control. At this point, I think AAD or ablation would pose greater risk than benefit, particularly given his comorbidities. The patient and his son agree with this plan.   Interstitial lung disease Following with pulmonology  Coronary atherosclerosis Crestor 10mg , ASA 81, Metoprolol XL 50  Ascending aortic aneurysm           Medication Adjustments/Labs  and Tests Ordered: Current medicines are reviewed at length with the patient today.  Concerns regarding medicines are outlined above.  No orders of the defined types were placed in this encounter.  No orders of the defined types were placed in this encounter.    Signed, Maurice Small, MD  08/13/2022 3:33 PM    [facility]

## 2022-08-19 ENCOUNTER — Other Ambulatory Visit: Payer: Self-pay | Admitting: Internal Medicine

## 2022-09-02 ENCOUNTER — Ambulatory Visit (INDEPENDENT_AMBULATORY_CARE_PROVIDER_SITE_OTHER): Payer: Medicare Other | Admitting: Internal Medicine

## 2022-09-02 DIAGNOSIS — J849 Interstitial pulmonary disease, unspecified: Secondary | ICD-10-CM

## 2022-09-02 LAB — PULMONARY FUNCTION TEST
DL/VA % pred: 6 %
DL/VA: 0.29 ml/min/mmHg/L
DLCO cor % pred: 1 %
DLCO cor: 0.44 ml/min/mmHg
DLCO unc % pred: 1 %
DLCO unc: 0.44 ml/min/mmHg
FEF 25-75 Pre: 2.32 L/sec
FEF2575-%Pred-Pre: 135 %
FEV1-%Pred-Pre: 75 %
FEV1-Pre: 1.76 L
FEV1FVC-%Pred-Pre: 123 %
FEV6-%Pred-Pre: 61 %
FEV6-Pre: 1.88 L
FEV6FVC-%Pred-Pre: 100 %
FVC-%Pred-Pre: 60 %
FVC-Pre: 1.88 L
Pre FEV1/FVC ratio: 94 %
Pre FEV6/FVC Ratio: 100 %

## 2022-09-02 NOTE — Progress Notes (Signed)
Spiro/DLCO performed today.  

## 2022-09-02 NOTE — Patient Instructions (Signed)
Spiro/DLCO performed today.  

## 2022-09-12 ENCOUNTER — Encounter: Payer: Self-pay | Admitting: Internal Medicine

## 2022-09-12 ENCOUNTER — Ambulatory Visit: Payer: Medicare Other | Admitting: Internal Medicine

## 2022-09-12 VITALS — BP 120/70 | HR 73 | Ht 65.0 in | Wt 104.6 lb

## 2022-09-12 DIAGNOSIS — R634 Abnormal weight loss: Secondary | ICD-10-CM | POA: Diagnosis not present

## 2022-09-12 DIAGNOSIS — R5381 Other malaise: Secondary | ICD-10-CM | POA: Diagnosis not present

## 2022-09-12 DIAGNOSIS — J849 Interstitial pulmonary disease, unspecified: Secondary | ICD-10-CM | POA: Diagnosis not present

## 2022-09-12 DIAGNOSIS — Z5181 Encounter for therapeutic drug level monitoring: Secondary | ICD-10-CM

## 2022-09-12 DIAGNOSIS — J84112 Idiopathic pulmonary fibrosis: Secondary | ICD-10-CM | POA: Diagnosis not present

## 2022-09-12 MED ORDER — PREDNISONE 10 MG PO TABS: ORAL_TABLET | ORAL | 0 refills | Status: AC

## 2022-09-12 NOTE — Progress Notes (Signed)
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   Prior cough in  2019 for several months  resolved on on some kind of powder inhaler x one week = Advair     History of Present Illness  08/04/2019  Pulmonary/ 1st office eval/[name]  Chief Complaint  Patient presents with   Pulmonary Consult    Referred by Dr Manus Gunning. Pt c/o non prod cough and throat clearing x 6 months.   Dyspnea:  None/ no aerobics, walking neighborhoods ok  Cough: always dry/ any candy helps if keeps in mouth but esp hard candy / does not use cough drops  Sleep: rotates on side, bed is flat/ one pillow, never wakes up cough but /win 15 min of stirring recurs daily  SABA use: never, also never prednisone Always trouble swallowing pills x lifetime  Call Dr Versie Starks office and ask what inhaler you received in 2019 as sample that seemed to help your cough = advair dpi Stop cetrizine, flonase  Prednisone 10 mg take  4 each am x 2 days,   2 each am x 2 days,  1 each am x 2 days and stop  Pantoprazole (protonix) 40 mg   Take  30-60 min before first meal of the day and Pepcid (famotidine)  20 mg one after supper  until return to office  GERD   Gabapentin 100 mg four times daily (ok to stop when no coughing at all for a week)  Please schedule a follow up office visit in 4 weeks, sooner if needed  with all medications /inhalers/ solutions in hand so we can verify exactly what you are taking. This includes  all medications from all doctors and over the counters    09/06/2019  f/u ov/[name] re: uacs on gabapenitn 100 mg three times  wolicki had tried gabapentin but failed to reveal that at last ov when I recommended it  Chief Complaint  Patient presents with   Follow-up    4 wk f/u. Wants to discuss switching Advair.   Dyspnea:  No  Cough: better just while on prednisone maybe 50-80%   Sleeping: no noct cough SABA use: none 02: none  rec Gabapentin 200 mg up to 4 x daily  Pantoprazole (protonix) 40 mg   Take  30-60 min before first meal of the day and Pepcid (famotidine)  20 mg one @  bedtime until return to office - this is the best way to tell whether stomach acid is contributing to your problem.   Keep the candy handy  Prednisone 10 mg  Take 4 for three days 3 for three days 2 for three days 1 for three days and stop  If not better in 2 weeks see Dr Loreta Ave for your reflux  Please remember to go to the lab department and xray dept  for your tests -  we will call you with the results when they are available.         OV 10/05/2019  Subjective:  [name], male , DOB: 03/19/1941 , age 82 y.o. , MRN: [id number] , ADDRESS: [address] [place] [address]   10/05/2019 -   Chief Complaint  Patient presents with   Follow-up    Pt states he has been doing okay since last visit and denies any complaints.   -82 year old [place] male.  Referred for interstitial lung disease.  He is accompanied by his son Jeff Willis who is a Teacher, early years/pre.  I know [name]  from a few years ago when heused to accompany his aunt; asthma patient of mine. Jeff Willis  has has been referred by Dr. Sandrea Hughs to the ILD center.  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  Up until the pandemic used to travel quite frequently.  He is to go to [place] for 3 months.  At none of the properties he is being exposed to mold.  He developed insidious onset of chronic cough over a year  ago this has been persistent.  In the last few months several cough related treatments have been tried with some improvement.  Has been on gabapentin but this made him sleepy and is currently stopped it.  Because of chronic cough which he only attributes his constant clearing of the throat he had a high-resolution CT scan of the chest that I personally visualized and shows probable UIP pattern and therefore he has been referred here.  In my personal visualization it might even be definite of UIP because there might be some early honeycombing there.  He denies any shortness of breath.  At rest he was found to be tachycardic but he says this can at baseline for him.  He will follow this up with his primary care physician.  [facility]( ILD Questionnaire  Symptoms:   He denies any shortness of breath.  No episodic shortness of breath.  However on the questioning he does report mild shortness of breath climbing up stairs.  In terms of his cough it started in June 2020.  It is the same since it started maybe in the last week it is slightly better.  Is moderate in intensity.  He does clear the throat.  Mostly dry cough occasionally white sputum.  Does feel a tickle in the back of his throat.  He is not waking up in the middle of the night.  Occasionally affects his voice.  No nausea no vomiting no diarrhea no wheezing.   Past Medical History : Denies any asthma COPD or heart failure rheumatoid arthritis or scleroderma or lupus or polymyositis or Sjogren's.  Denies sleep apnea.  Denies HIV denies pulmonary hypertension.  Denies diabetes or thyroid disease or stroke or seizures mononucleosis.  Denies any hepatitis or tuberculosis or kidney disease or pneumonia or blood clots or heart disease or pleurisy.  Does have on and off acid reflux for the last year   ROS: Positive for dry mouth for the last several months.  He also has some dysphagia for swallowing pills unclear for what duration.  He  also has acid reflux on PPI and H2 blocker.  Denies any Raynaud's or recurrent fever or weight loss.  No nausea no vomiting.   FAMILY HISTORY of LUNG DISEASE: * -Denies family history of COPD or asthma sarcoid or cystic fibrosis of hypersensitive pneumonitis or autoimmune disease.   EXPOSURE  HISTORY: Denies any Covid.  Denies smoking cigarettes.  Denies marijuana denies smoking.  Denies IV drug use denies cocaine   HOME and HOBBY DETAILS : Single-family home [place] for the last 10 years.  Age of the home is 15 years.  No dampness.  No mildew.  No humidifier use.  No CPAP use no nebulizer machine use.  He does use a steam iron but there is no mold or mildew in it.  There is no [place] inside the house no pet birds or parakeets.  No pet gerbils no feather pillows no mold in the [facility] duct no music habits.  He does do some gardening very small garden in the house limited gardening mostly just water the garden.  No bird feather exposure no flood damage no strong mats no hot tub or Jacuzzi.  No exposure to animals at work.   OCCUPATIONAL HISTORY (122 questions) :   -Organic antigen exposure is negative inorganic exposure is negative   PULMONARY TOXICITY HISTORY (27 items):  -18-day prednisone course that ended last week.  He did have seen implantation for prostate cancer in October 2019 these were radiation seeds.      Results for EATHIN, KIRKBY (MRN [id number]) as of 11/15/2019 16:27  Ref. Range 09/07/2019 11:29 10/05/2019 11:25  Anti Nuclear Antibody (ANA) Latest Ref Range: NEGATIVE   POSITIVE (A)  ANA Pattern 1 Unknown  Nuclear, Speckled (A)  ANA Titer 1 Latest Units: titer  1:40 (H)  Angiotensin-Converting Enzyme Latest Ref Range: 9 - 67 U/L  34  Cyclic Citrullin Peptide Ab Latest Units: UNITS  <16  ds DNA Ab Latest Units: IU/mL  <1  Myeloperoxidase Abs Latest Units: AI  <1.0  Serine Protease 3 Latest Units: AI  <1.0  RA Latex Turbid. Latest Ref Range: <14 IU/mL  <14   IgE (Immunoglobulin E), Serum Latest Ref Range: <OR=114 kU/L 36   SSA (Ro) (ENA) Antibody, IgG Latest Ref Range: <1.0 NEG AI  <1.0 NEG  SSB (La) (ENA) Antibody, IgG Latest Ref Range: <1.0 NEG AI  <1.0 NEG  Scleroderma (Scl-70) (ENA) Antibody, IgG Latest Ref Range: <1.0 NEG AI  <1.0 NEG     Simple office walk 185 feet x  3 laps goal with forehead probe 10/05/2019   O2 used ra  Number laps completed 3  Comments about pace avt  Resting Pulse Ox/HR 100% and 122/min  Final Pulse Ox/HR 100% and 143/min  Desaturated </= 88% x  Desaturated <= 3% points x  Got Tachycardic >/= 90/min x  Symptoms at end of test x  Miscellaneous comments x    Results for AMOL, MEAH (MRN [id number]) as of 10/05/2019 10:40  Ref. Range 09/07/2019 11:29  Eosinophils Absolute Latest Ref Range: 0.0 - 0.7 K/uL 0.1     High-resolution CT chest Sep 15, 2019: Personally visualized and agree with the findings below IMPRESSION: 1. Spectrum of findings compatible with basilar predominant fibrotic interstitial lung disease with suggestion of early honeycombing. 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 Am Rosezetta Schlatter Crit Care Med Vol 198, Iss 5, [phone], Dec 28 2016. 2. Ectatic 4.4 cm ascending thoracic aorta. Recommend annual imaging followup by CTA or MRA. This recommendation follows 2010 ACCF/AHA/AATS/ACR/ASA/SCA/SCAI/SIR/STS/SVM Guidelines for the Diagnosis and Management of Patients with Thoracic Aortic Disease. Circulation. 2010; 121: Q469-G295. Aortic aneurysm NOS (ICD10-I71.9). 3. One vessel coronary atherosclerosis. 4. Aortic Atherosclerosis (ICD10-I70.0).  Electronically Signed   By: Delbert Phenix M.D.   On: 09/15/2019 14:26     Results for MALACHY, VLASIC (MRN [id number]) as of 10/05/2019 10:40  Ref. Range 09/07/2019 11:29  Sheep Sorrel IgE Latest Units: kU/L  <0.10  Pecan/Hickory Tree IgE Latest Units: kU/L <0.10  IgE (Immunoglobulin E), Serum Latest Ref Range: <OR=114 kU/L 36  Allergen, D pternoyssinus,d7 Latest Units: kU/L <0.10  Cat Dander Latest Units: kU/L <0.10  Dog Dander Latest Units: kU/L <0.10  [place] Grass Latest Units: kU/L <0.10  Johnson Grass Latest Units: kU/L <0.10  Timothy Grass Latest Units: kU/L <0.10  Cockroach Latest Units: kU/L <0.10  Aspergillus fumigatus, m3 Latest Units: kU/L <0.10  Allergen, Comm Silver Charletta Cousin, t9 Latest Units: kU/L <0.10  Allergen, Cottonwood, t14 Latest Units: kU/L <0.10  Elm IgE Latest Units: kU/L <0.10  Allergen, Mulberry, t76 Latest Units: kU/L <0.10  Allergen, Oak,t7 Latest Units: kU/L <0.10  COMMON RAGWEED (SHORT) (W1) IGE Latest Units: kU/L 0.15 (H)  Allergen, Mouse Urine Protein, e78 Latest Units: kU/L <0.10  D. farinae Latest Units: kU/L <0.10  Allergen, Cedar tree, t12 Latest Units: kU/L <0.10  Box Elder IgE Latest Units: kU/L <0.10  Rough Pigweed  IgE Latest Units: kU/L <0.10    ROS - per HPI   OV 11/15/2019  Subjective:  [name], male , DOB: 11/25/1940 , age 67 y.o. , MRN: [id number] , ADDRESS: [address] [place] [address]   11/15/2019 -   Chief Complaint  Patient presents with   Follow-up    Pt states the cough has been better since last visit.      HPI Taje G Oberry 82 y.o. -presents with his son Jeff Willis for follow-up.  He is here to discuss test results.  In the interim he called for worsening cough.  We gave him prednisone.  He tells me the prednisone actually made the cough worse particularly towards the end.  After that the son  doubled up his PPI and also added H2 blockade and the cough went away completely.  Patient feels completely normal that he is possible that he has interstitial lung disease.  We reviewed the scan and I visualized it with him together and his son.  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  They wanted to discuss the implications  of the scan in the setting of controlled acid reflux and controlled cough but he is largely asymptomatic at this point.  Noted his serologies were negative except for trace ANA and his overnight oxygen study was normal.     OV 01/17/2020   Subjective:  [name], male , DOB: 1940/07/22, age 60 y.o. years. , MRN: [id number],  ADDRESS: [address] [place] [address] PCP  Blair Heys, MD Providers : Treatment Team:  Attending Provider: Kalman Shan, MD   Chief Complaint  Patient presents with   Follow-up    pt is here to go over ct and pft    Follow-up interstitial lung disease Follow-up associated cough partially controlled with acid reflux treatment Associated weight loss present   HPI Jeff Willis 82 y.o. -presents with his son Jeff Willis.  In the interim he is continue to lose weight.  He has lost another 3-4 pounds since June 2021.  His clothes are much looser.  He thinks he has been steadily losing weight.  His respiratory symptoms are not any worse although he says that his cough that seem to initially improve with doubling up of acid reflux  therapy seems to persist.  At last visit in September 2020 when I thought the cough resolved but they tell me the cough only partially improved.  Nevertheless it is persisting and they are worried about it.  In addition he is worried about weight loss.  He also has poor appetite.  He is not in any pulmonary fibrosis antifibrotic's.  There is no diarrhea.  Symptom score suggest some element of anxiety since his last visit.  He has had pulmonary function test and it shows his FVC is actually better but his DLCO is actually worse.  He had high-resolution CT chest that I visualized and reviewed with him.  It shows probable UIP with possible emerging honeycombing.  I agree with the radiologist.  The radiologist actually feels that it might be a little bit worse compared to few months ago.  Of note: He has  upcoming trip to [place] between March 05, 2020 and February 2022.  He wants to make sure his care is coordinated.  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 Evaluate for pulmonary fibrosis.   EXAM: CT CHEST WITHOUT CONTRAST   TECHNIQUE: Multidetector CT imaging of the chest was performed following the standard protocol without intravenous contrast. High resolution imaging of the lungs, as well as inspiratory and expiratory imaging, was performed.   COMPARISON:  Chest CT 09/15/2019.   FINDINGS: Cardiovascular: Heart size is normal. There is no significant pericardial fluid, thickening or pericardial calcification. There is aortic atherosclerosis, as well as atherosclerosis of the great vessels of the mediastinum and the coronary arteries, including calcified atherosclerotic plaque in the left main, left anterior descending and right coronary arteries. Dilatation of the ascending thoracic aorta measuring 4.5 cm in diameter.   Mediastinum/Nodes: No pathologically enlarged mediastinal or hilar lymph nodes. Please note that accurate exclusion of hilar adenopathy is limited on noncontrast CT scans. Esophagus is unremarkable in appearance. No axillary lymphadenopathy.   Lungs/Pleura: High-resolution images demonstrate widespread areas of ground-glass attenuation, septal thickening, thickening of the peribronchovascular interstitium, mild cylindrical bronchiectasis and peripheral bronchiolectasis. There is a suggestion of, but not definitive evidence of, early honeycombing in portions of the lungs. These findings do have a definitive craniocaudal gradient and appear slightly progressive compared to the prior study. Inspiratory and expiratory imaging is unremarkable. No acute consolidative airspace disease. No pleural effusions. No definite suspicious  appearing pulmonary nodules or masses are noted.   Upper Abdomen: Cortical calcification in the upper pole of the right kidney incidentally noted.   Musculoskeletal: There are no aggressive appearing lytic or blastic lesions noted in the visualized portions of the skeleton.   IMPRESSION: 1. The appearance of the lungs is compatible with progressive interstitial lung disease categorized as probable usual interstitial pneumonia (UIP) per current ATS guidelines. 2. Aortic atherosclerosis, in addition to left main and 2 vessel coronary artery disease. In addition, there is mild aneurysmal dilatation of the ascending thoracic aorta (4.5 cm in diameter). Ascending thoracic aortic aneurysm. Recommend semi-annual imaging followup by CTA or MRA and referral to cardiothoracic surgery if not already obtained. This recommendation follows 2010 ACCF/AHA/AATS/ACR/ASA/SCA/SCAI/SIR/STS/SVM Guidelines for the Diagnosis and Management of Patients With Thoracic Aortic Disease. Circulation. 2010; 121: Z610-R604. Aortic aneurysm NOS (ICD10-I71.9).   Aortic Atherosclerosis (ICD10-I70.0). Aortic aneurysm NOS (ICD10-I71.9).     Electronically Signed   By: Trudie Reed M.D.   On:  01/13/2020 15:15    ROS - per HPI    OV 03/01/2020  Subjective:  [name], male , DOB: 06-06-1940 , age 88 y.o. , MRN: [id number] , ADDRESS: [address] [place] [address] PCP Blair Heys, MD Patient Care Team: Blair Heys, MD as PCP - General (Family Medicine)  This Provider for this visit: Treatment Team:  Attending Provider: Kalman Shan, MD  FU IPF Associated weight loss + pre-ofev   03/01/2020 -   Chief Complaint  Patient presents with   Follow-up    ILD, doing ok, cough has not improved     HPI Jeff Willis 82 y.o. -returns for follow-up.  This is after starting nintedanib.  He is currently on 100 mg twice daily for 2 weeks.  His prescription is  approved on February 02, 2020.  He accidentally got 150 mg twice daily but he sent this back.  He is going to send [place] back.  He has upcoming travel to [place] later this week he will be gone there for till February 2022.  He has supply and refill of his nintedanib on 06 March 2020.  After that he does not have nintedanib.  He and his son report that somebody will be able to go to [place] and deliver it to him.  They do not want to trust the courier service to take it there the supply chain issues.  At this point time from a respiratory standpoint he has minimal dyspnea.  He still has a cough.  The cough is not worse but after reducing the PPI dose the cough has come back.  It is somewhat troublesome.  The severity score is listed below.  They report the GI Dr. Loreta Ave has reported concern about taking very high-dose of PPI and H2 blockade which actually helped the cough.  Is because of osteoporosis risk.  I agreed with that concern.  However patient is also reporting significant symptom burden.  Primary care has suggested that they follow the symptom control and quality of life issues.  I did support the primary care viewpoint as well.  Did explain to them at the end of the day as a risk-benefit ratio and with an advanced disease like pulmonary fibrosis 1 might have to consider taking a short-term again with cough control at the expense of long-term risk of side effects.  They are going to think about this.  In terms of his travel to [place].  He put me in touch with Dr. Allena Katz.  I texted him and establish contact.  He will follow with Dr. Allena Katz who is a pulmonologist trained in [place] but now living in [place].  Gave other travel advice are listed below.  In terms of weight loss: This is ongoing.  Even after starting the Ofev is not having any GI side effects but his weight loss continues.  He had a CT abdomen that is not showing any reason for weight loss.  Apparently had some hematuria had cystoscopy with Dr. Mena Goes.  He  has some radiation seed implants.  There is no bladder cancer.  The really frustrated by this.  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 dx GERD rx PPI bid before meals x sev months with sinus ct neg and cxr ok and Dg Es mild gerd but no better on prilosec and no worse off so rec gabapentin which he did not try (didn't work for back pain, made him sleepy in higher doses ) so  referred to pulmonary clinic 08/04/2019 by Dr Dinah Beers.   Prior cough in  2019 for several months  resolved on on some kind of powder inhaler x one week = Advair     History of Present Illness  08/04/2019  Pulmonary/ 1st office eval/[name]  Chief Complaint  Patient presents with   Pulmonary Consult    Referred by Dr Manus Gunning. Pt c/o non prod cough and throat clearing x 6 months.   Dyspnea:  None/ no aerobics, walking neighborhoods ok  Cough: always dry/ any candy helps if keeps in mouth but esp hard candy / does not use cough drops  Sleep: rotates on side, bed is flat/ one pillow, never wakes up cough but /win 15 min of stirring recurs daily  SABA use: never, also never prednisone Call Dr Versie Starks office and ask what inhaler you received in 2019 as sample that seemed to help your cough = advair dpi Stop cetrizine, flonase  Prednisone 10 mg take  4 each am x 2 days,   2 each am x 2 days,  1 each am x 2 days and stop  Pantoprazole (protonix) 40 mg   Take  30-60 min before first meal of the day and Pepcid (famotidine)  20 mg one after supper  until return to office  GERD   Gabapentin 100 mg  four times daily (ok to stop when no coughing at all for a week)  Please schedule a follow up office visit in 4 weeks, sooner if needed  with all medications /inhalers/ solutions in hand so we can verify exactly what you are taking. This includes all medications from all doctors and over the counters    09/06/2019  f/u ov/[name] re: uacs on gabapenitn 100 mg three times  wolicki had tried gabapentin but failed to reveal that at last ov when I recommended it  Chief Complaint  Patient presents with   Follow-up    4 wk f/u. Wants to discuss switching Advair.   Dyspnea:  No  Cough: better just while on prednisone maybe 50-80%   Sleeping: no noct cough SABA use: none 02: none  rec Gabapentin 200 mg up to 4 x daily  Pantoprazole (protonix) 40 mg   Take  30-60 min before first meal of the day and Pepcid (famotidine)  20 mg one @  bedtime until return to office - this is the best way to tell whether stomach acid is contributing to your problem.   Keep the candy handy  Prednisone 10 mg  Take 4 for three days 3 for three days 2 for three days 1 for three days and stop  If not better in 2 weeks see Dr Loreta Ave for your reflux  Please remember to go to the lab department and xray dept  for your tests - we will call you with the results when they are available.         OV 10/05/2019  Subjective:  [name],  male , DOB: Jul 01, 1940 , age 53 y.o. , MRN: [id number] , ADDRESS: [address] [place] [address]   10/05/2019 -   Chief Complaint  Patient presents with   Follow-up    Pt states he has been doing okay since last visit and denies any complaints.   -82 year old [place] male.  Referred for interstitial lung disease.  He is accompanied by his son Jeff Willis who is a Teacher, early years/pre.  I know [name]  from a few years ago when heused to accompany his aunt; asthma patient of mine. Jeff Willis  has has been referred by Dr. Sandrea Hughs to the ILD center.  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  Up until the pandemic used to travel quite frequently.  He is to go to [place] for 3 months.  At none of the properties he is being exposed to mold.  He developed insidious onset of chronic cough over a year ago this has been persistent.  In the last few months several cough related treatments have been tried with some improvement.  Has been on gabapentin but this made him sleepy and is currently stopped it.  Because of chronic cough which he only attributes his constant clearing of the throat he had a high-resolution CT scan of the chest that I personally visualized and shows probable UIP pattern and therefore he has been referred here.  In my personal visualization it might even be definite of UIP because there might be some early honeycombing there.  He denies any shortness of breath.  At rest he was found to be tachycardic but he says this can at baseline for him.  He will follow this up with his primary care physician.  [place] [facility] Comprehensive( ILD Questionnaire  Symptoms:   He denies any shortness of breath.  No episodic shortness of breath.  However on the questioning he does report mild shortness of breath climbing up stairs.  In terms of his cough it started in June 2020.  It is the same since it started maybe in the last week it is slightly better.  Is moderate in intensity.  He does clear the throat.  Mostly dry cough occasionally white sputum.  Does feel a tickle in the back of his throat.  He is not waking up in the middle of the night.  Occasionally affects his voice.  No nausea no vomiting no diarrhea no wheezing.     Past Medical History : Denies any asthma COPD or heart failure rheumatoid arthritis or scleroderma or lupus or polymyositis or Sjogren's.  Denies sleep apnea.  Denies HIV denies pulmonary hypertension.  Denies diabetes or thyroid disease or stroke or seizures mononucleosis.  Denies any hepatitis or tuberculosis or kidney  disease or pneumonia or blood clots or heart disease or pleurisy.  Does have on and off acid reflux for the last year   ROS: Positive for dry mouth for the last several months.  He also has some dysphagia for swallowing pills unclear for what duration.  He also has acid reflux on PPI and H2 blocker.  Denies any Raynaud's or recurrent fever or weight loss.  No nausea no vomiting.   FAMILY HISTORY of LUNG DISEASE: * -Denies family history of COPD or asthma sarcoid or cystic fibrosis of hypersensitive pneumonitis or autoimmune disease.   EXPOSURE HISTORY: Denies any Covid.  Denies smoking cigarettes.  Denies marijuana denies smoking.  Denies IV drug use denies cocaine   HOME and HOBBY DETAILS :  Single-family home [place] for the last 10 years.  Age of the home is 15 years.  No dampness.  No mildew.  No humidifier use.  No CPAP use no nebulizer machine use.  He does use a steam iron but there is no mold or mildew in it.  There is no [place] inside the house no pet birds or parakeets.  No pet gerbils no feather pillows no mold in the [facility] duct no music habits.  He does do some gardening very small garden in the house limited gardening mostly just water the garden.  No bird feather exposure no flood damage no strong mats no hot tub or Jacuzzi.  No exposure to animals at work.   OCCUPATIONAL HISTORY (122 questions) :   -Organic antigen exposure is negative inorganic exposure is negative   PULMONARY TOXICITY HISTORY (27 items):  -18-day prednisone course that ended last week.  He did have seen implantation for prostate cancer in October 2019 these were radiation seeds.      Results for DEMONTAE, NEWBERG (MRN [id number]) as of 11/15/2019 16:27  Ref. Range 09/07/2019 11:29 10/05/2019 11:25  Anti Nuclear Antibody (ANA) Latest Ref Range: NEGATIVE   POSITIVE (A)  ANA Pattern 1 Unknown  Nuclear, Speckled (A)  ANA Titer 1 Latest Units: titer  1:40 (H)  Angiotensin-Converting Enzyme  Latest Ref Range: 9 - 67 U/L  34  Cyclic Citrullin Peptide Ab Latest Units: UNITS  <16  ds DNA Ab Latest Units: IU/mL  <1  Myeloperoxidase Abs Latest Units: AI  <1.0  Serine Protease 3 Latest Units: AI  <1.0  RA Latex Turbid. Latest Ref Range: <14 IU/mL  <14  IgE (Immunoglobulin E), Serum Latest Ref Range: <OR=114 kU/L 36   SSA (Ro) (ENA) Antibody, IgG Latest Ref Range: <1.0 NEG AI  <1.0 NEG  SSB (La) (ENA) Antibody, IgG Latest Ref Range: <1.0 NEG AI  <1.0 NEG  Scleroderma (Scl-70) (ENA) Antibody, IgG Latest Ref Range: <1.0 NEG AI  <1.0 NEG     Simple office walk 185 feet x  3 laps goal with forehead probe 10/05/2019   O2 used ra  Number laps completed 3  Comments about pace avt  Resting Pulse Ox/HR 100% and 122/min  Final Pulse Ox/HR 100% and 143/min  Desaturated </= 88% x  Desaturated <= 3% points x  Got Tachycardic >/= 90/min x  Symptoms at end of test x  Miscellaneous comments x    Results for OTHA, KELLNER (MRN [id number]) as of 10/05/2019 10:40  Ref. Range 09/07/2019 11:29  Eosinophils Absolute Latest Ref Range: 0.0 - 0.7 K/uL 0.1     High-resolution CT chest Sep 15, 2019: Personally visualized and agree with the findings below IMPRESSION: 1. Spectrum of findings compatible with basilar predominant fibrotic interstitial lung disease with suggestion of early honeycombing. 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 Am Rosezetta Schlatter Crit Care Med Vol 198, Iss 5, [phone], Dec 28 2016. 2. Ectatic 4.4 cm ascending thoracic aorta. Recommend annual imaging followup by CTA or MRA. This recommendation follows 2010 ACCF/AHA/AATS/ACR/ASA/SCA/SCAI/SIR/STS/SVM Guidelines for the Diagnosis and Management of Patients with Thoracic Aortic Disease. Circulation. 2010; 121: H086-V784. Aortic aneurysm NOS (ICD10-I71.9). 3. One vessel coronary  atherosclerosis. 4. Aortic Atherosclerosis (ICD10-I70.0).     Electronically Signed   By: Delbert Phenix M.D.   On: 09/15/2019 14:26     Results for YURIEL, TORNO (MRN [id number]) as  of 10/05/2019 10:40  Ref. Range 09/07/2019 11:29  Sheep Sorrel IgE Latest Units: kU/L <0.10  Pecan/Hickory Tree IgE Latest Units: kU/L <0.10  IgE (Immunoglobulin E), Serum Latest Ref Range: <OR=114 kU/L 36  Allergen, D pternoyssinus,d7 Latest Units: kU/L <0.10  Cat Dander Latest Units: kU/L <0.10  Dog Dander Latest Units: kU/L <0.10  [place] Grass Latest Units: kU/L <0.10  Johnson Grass Latest Units: kU/L <0.10  Timothy Grass Latest Units: kU/L <0.10  Cockroach Latest Units: kU/L <0.10  Aspergillus fumigatus, m3 Latest Units: kU/L <0.10  Allergen, Comm Silver Charletta Cousin, t9 Latest Units: kU/L <0.10  Allergen, Cottonwood, t14 Latest Units: kU/L <0.10  Elm IgE Latest Units: kU/L <0.10  Allergen, Mulberry, t76 Latest Units: kU/L <0.10  Allergen, Oak,t7 Latest Units: kU/L <0.10  COMMON RAGWEED (SHORT) (W1) IGE Latest Units: kU/L 0.15 (H)  Allergen, Mouse Urine Protein, e78 Latest Units: kU/L <0.10  D. farinae Latest Units: kU/L <0.10  Allergen, Cedar tree, t12 Latest Units: kU/L <0.10  Box Elder IgE Latest Units: kU/L <0.10  Rough Pigweed  IgE Latest Units: kU/L <0.10    ROS - per HPI   OV 11/15/2019  Subjective:  [name], male , DOB: Jun 04, 1940 , age 7 y.o. , MRN: [id number] , ADDRESS: [address] [place] [address]   11/15/2019 -   Chief Complaint  Patient presents with   Follow-up    Pt states the cough has been better since last visit.      HPI Tadeusz G Signore 82 y.o. -presents with his son Jeff Willis for follow-up.  He is here to discuss test results.  In the interim he called for worsening cough.  We gave him prednisone.  He tells me the prednisone actually made the cough worse particularly towards the end.  After that the son  doubled up his PPI and  also added H2 blockade and the cough went away completely.  Patient feels completely normal that he is possible that he has interstitial lung disease.  We reviewed the scan and I visualized it with him together and his son.  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  They wanted to discuss the implications of the scan in the setting of controlled acid reflux and controlled cough but he is largely asymptomatic at this point.  Noted his serologies were negative except for trace ANA and his overnight oxygen study was normal.     OV 01/17/2020   Subjective:  [name], male , DOB: November 07, 1940, age 32 y.o. years. , MRN: [id number],  ADDRESS: [address] [place] [address] PCP  Blair Heys, MD Providers : Treatment Team:  Attending Provider: Kalman Shan, MD   Chief Complaint  Patient presents with   Follow-up    pt is here to go over ct and pft    Follow-up interstitial lung disease Follow-up associated cough partially controlled with acid reflux treatment Associated weight loss present   HPI Ritik G Imai 82 y.o. -presents with his son Jeff Willis.  In the interim he is continue to lose weight.  He has lost another 3-4 pounds since June 2021.  His clothes are much looser.  He thinks he has been steadily losing weight.  His respiratory symptoms are not any worse although he says that his cough that seem to initially improve with doubling up of acid reflux therapy seems to persist.  At last visit in September 2020 when I thought the cough resolved but they tell me the cough only partially improved.  Nevertheless it is persisting and they are worried about it.  In addition he is worried about weight loss.  He also has poor appetite.  He is not in any pulmonary fibrosis antifibrotic's.  There is no diarrhea.  Symptom score suggest some element of anxiety since his last visit.  He has had pulmonary function test and it shows his FVC is actually better but his DLCO is actually  worse.  He had high-resolution CT chest that I visualized and reviewed with him.  It shows probable UIP with possible emerging honeycombing.  I agree with the radiologist.  The radiologist actually feels that it might be a little bit worse compared to few months ago.  Of note: He has upcoming trip to [place] between March 05, 2020 and February 2022.  He wants to make sure his care is coordinated.  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 Evaluate for pulmonary fibrosis.   EXAM: CT CHEST WITHOUT CONTRAST   TECHNIQUE: Multidetector CT imaging of the chest was performed following the standard protocol without intravenous contrast. High resolution imaging of the lungs, as well as inspiratory and expiratory imaging, was performed.   COMPARISON:  Chest CT 09/15/2019.   FINDINGS: Cardiovascular: Heart size is normal. There is no significant pericardial fluid, thickening or pericardial calcification. There is aortic atherosclerosis, as well as atherosclerosis of the great vessels of the mediastinum and the coronary arteries, including calcified atherosclerotic plaque in the left main, left anterior descending and right coronary arteries. Dilatation of the ascending thoracic aorta measuring 4.5 cm in diameter.   Mediastinum/Nodes: No pathologically enlarged mediastinal or hilar lymph nodes. Please note that accurate exclusion of hilar adenopathy is limited on noncontrast CT scans. Esophagus is unremarkable in appearance. No axillary lymphadenopathy.   Lungs/Pleura: High-resolution images demonstrate widespread areas of ground-glass attenuation, septal thickening, thickening of the peribronchovascular interstitium, mild cylindrical bronchiectasis and peripheral bronchiolectasis. There is a suggestion of, but not definitive evidence of, early honeycombing  in portions of the lungs. These findings do have a definitive craniocaudal gradient and appear slightly progressive compared to the prior study. Inspiratory and expiratory imaging is unremarkable. No acute consolidative airspace disease. No pleural effusions. No definite suspicious appearing pulmonary nodules or masses are noted.   Upper Abdomen: Cortical calcification in the upper pole of the right kidney incidentally noted.   Musculoskeletal: There are no aggressive appearing lytic or blastic lesions noted in the visualized portions of the skeleton.   IMPRESSION: 1. The appearance of the lungs is compatible with progressive interstitial lung disease categorized as probable usual interstitial pneumonia (UIP) per current ATS guidelines. 2. Aortic atherosclerosis, in addition to left main and 2 vessel coronary artery disease. In addition, there is mild aneurysmal dilatation of the ascending thoracic aorta (4.5 cm in diameter). Ascending thoracic aortic aneurysm. Recommend semi-annual imaging followup by CTA or MRA and referral to cardiothoracic surgery if not already obtained. This recommendation follows 2010 ACCF/AHA/AATS/ACR/ASA/SCA/SCAI/SIR/STS/SVM Guidelines for the Diagnosis and Management of Patients With Thoracic Aortic Disease. Circulation. 2010; 121: Z610-R604. Aortic aneurysm NOS (ICD10-I71.9).   Aortic Atherosclerosis (ICD10-I70.0). Aortic aneurysm NOS (ICD10-I71.9).     Electronically Signed   By: Trudie Reed M.D.   On: 01/13/2020 15:15    ROS - per HPI    OV 03/01/2020  Subjective:  [name], male , DOB: [birth year] ,  age 20 y.o. , MRN: [id number] , ADDRESS: [address] [place] [address] PCP Blair Heys, MD Patient Care Team: Blair Heys, MD as PCP - General (Family Medicine)  This Provider for this visit: Treatment Team:  Attending Provider: Kalman Shan, MD  FU IPF Associated weight loss +  pre-ofev   03/01/2020 -   Chief Complaint  Patient presents with   Follow-up    ILD, doing ok, cough has not improved     HPI Jeff Willis 82 y.o. -returns for follow-up.  This is after starting nintedanib.  He is currently on 100 mg twice daily for 2 weeks.  His prescription is approved on February 02, 2020.  He accidentally got 150 mg twice daily but he sent this back.  He is going to send [place] back.  He has upcoming travel to [place] later this week he will be gone there for till February 2022.  He has supply and refill of his nintedanib on 06 March 2020.  After that he does not have nintedanib.  He and his son report that somebody will be able to go to [place] and deliver it to him.  They do not want to trust the courier service to take it there the supply chain issues.  At this point time from a respiratory standpoint he has minimal dyspnea.  He still has a cough.  The cough is not worse but after reducing the PPI dose the cough has come back.  It is somewhat troublesome.  The severity score is listed below.  They report the GI Dr. Loreta Ave has reported concern about taking very high-dose of PPI and H2 blockade which actually helped the cough.  Is because of osteoporosis risk.  I agreed with that concern.  However patient is also reporting significant symptom burden.  Primary care has suggested that they follow the symptom control and quality of life issues.  I did support the primary care viewpoint as well.  Did explain to them at the end of the day as a risk-benefit ratio and with an advanced disease like pulmonary fibrosis 1 might have to consider taking a short-term again with cough control at the expense of long-term risk of side effects.  They are going to think about this.  In terms of his travel to [place].  He put me in touch with Dr. Allena Katz.  I texted him and establish contact.  He will follow with Dr. Allena Katz who is a pulmonologist trained in [place] but now living in [place].  Gave other travel  advice are listed below.  In terms of weight loss: This is ongoing.  Even after starting the Ofev is not having any GI side effects but his weight loss continues.  He had a CT abdomen that is not showing any reason for weight loss.  Apparently had some hematuria had cystoscopy with Dr. Mena Goes.  He has some radiation seed implants.  There is no bladder cancer.  The really frustrated by this.  I did pull out an article about IPF and weight loss for the son to read.- shows IPF weight loss is asssocited with worse progrnosis    ROS - per HPI    OV 07/24/2020  Subjective:  [name], male , DOB: Apr 03, 1941 , age 68 y.o. , MRN: [id number] , ADDRESS: [address] [place] [address] PCP Blair Heys, MD Patient Care Team: Blair Heys, MD as PCP - General (Family Medicine)  This Provider for this visit: Treatment Team:  Attending Provider: Kalman Shan, MD    07/24/2020 -   Chief Complaint  Patient presents with   Follow-up    4 mo f/u. States his breathing has been stable since last visit. Has noticed a decrease in his appetite. Increased diarrhea for the past week.    Idiopathic pulmonary fibrosis on nintedanib 100 mg twice daily -last CT September 2021 Weight loss unintentional even before nintedanib  HPI Jeff Willis 82 y.o. -returns for follow-up.  He was in [place] and returned back in early March 2022.  He is now here with his wife.  He tells me while he was in [place] for 4 months he had a great time.  He barely had any diarrhea.  Approximately 2 months ago which is 1 month before his return he switched to an [place] version of nintedanib called NINTIB.  He was taking it for a month in [place] and did not have any problems.  Upon returning to the [place] he is starting to have diarrhea.  The diarrhea is ongoing and is 3 out of 5.  Happens 2 or 3 times a week and controlled by Imodium.  His son who is a Teacher, early years/pre thinks it is because he  is in the [place] version of nintedanib.  He says he spoke to the [place] pulmonologist and was told that exponentially there is no difference in diarrhea between the [place] version of nintedanib and the [place] version of nintedanib.  Diarrhea happens randomly does bother him.  He says his diet has not changed since returning to the [place].  He says he eats the same [place] diet.  He does take sugar with tea in the morning.  In terms of his tachycardia it still is ongoing.  He is never had echocardiogram.  In terms of weight loss he continues to lose weight.  This weight loss started even before he was on nintedanib.  It is still ongoing.  He is lost 10 pounds since June 2021.  He is worried about this.  PFT  OV 09/22/2020  Subjective:  [name], male , DOB: [birth year] , age 54 y.o. , MRN: [id number] , ADDRESS: [address] [place] [address] PCP Blair Heys, MD Patient Care Team: Blair Heys, MD as PCP - General (Family Medicine)  This Provider for this visit: Treatment Team:  Attending Provider: Kalman Shan, MD    09/22/2020 -   Chief Complaint  Patient presents with   Follow-up    2 mo f/u after PFT. States he is still struggling with diarrhea and losing weight. States his breathing has been stable since last visit.    Idiopathic pulmonary fibrosis on nintedanib 100 mg twice daily since Oct 2021   -last CT September 2021 Weight loss unintentional even before nintedanib Cough Tachycardia NOS  HPI Jeff Willis 82 y.o. -returns for follow-up of all the above medical issues.  From a respiratory standpoint he continues to be stable.  He had pulmonary function test that shows continued stability/improvement.  His cough continues.  He in fact he tells me that for the last 2 or 3 years mostly when he drinks water or eats food he starts choking and coughing and clears his throat.  He has now seen ENT and the dysphagia evaluation  is set up.  He is continue to lose weight.  In fact he is lost 10 pounds of weight since starting nintedanib.  The weight loss was the even before nintedanib but  has accelerated after nintedanib.  He has diarrhea with the nintedanib.  This is on low-dose nintedanib.  Currently is on the [place] version of the nintedanib but he still losing weight and having the same GI symptoms.  In terms of his tachycardia he had echocardiogram and this is normal.  He says his wife is very concerned about his weight loss.  His pants are loose now.      ECHO MAY 2022   IMPRESSIONS     1. Left ventricular ejection fraction, by estimation, is 60 to 65%. The  left ventricle has normal function. The left ventricle has no regional  wall motion abnormalities. Left ventricular diastolic parameters are  indeterminate.   2. Right ventricular systolic function is normal. The right ventricular  size is normal. There is mildly elevated pulmonary artery systolic  pressure. The estimated right ventricular systolic pressure is 34.8 mmHg.   3. The mitral valve is normal in structure. No evidence of mitral valve  regurgitation.   4. Tricuspid valve regurgitation is mild to moderate.   5. The aortic valve is tricuspid. Aortic valve regurgitation is not  visualized. No aortic stenosis is present.   6. The inferior vena cava is normal in size with greater than 50%  respiratory variability, suggesting right atrial pressure of 3 mmHg.     OV 01/11/2021  Subjective:  [name], male , DOB: 13-Sep-1940 , age 84 y.o. , MRN: [id number] , ADDRESS: [address] [place] [address] PCP Blair Heys, MD Patient Care Team: Blair Heys, MD as PCP - General (Family Medicine)  This Provider for this visit: Treatment Team:  Attending Provider: Kalman Shan, MD Type of visit: Telephone/Video Circumstance: COVID-19 national emergency Identification of patient Jeff Willis  with 11/14/1940 and MRN [id number] - 2 person identifier Risks: Risks, benefits, limitations of telephone visit explained. Patient understood and verbalized agreement to proceed Anyone else on call: no one other than patient and MD Patient location: patient home This provider location: [address]; [place], [place], [address]   01/11/2021 -  IPF followup   Idiopathic pulmonary fibrosis on nintedanib 100 mg twice daily since Oct 2021   -last CT September 2021 Weight loss unintentional even before nintedanib Cough Tachycardia NOS  HPI Keyontae G Dockham 82 y.o. - off ofev since may 2022 Gained weight 5#/ Minimal dyspnea to no dyspnea even with exertion. [place] in interim and had good time. Did water slide. Cough is a bit worse. Othewrise well. Enegery level goodl Though cough is mild - wants something for cough relief thought says medicine from [place] helping.  Plans to go to [place] on February 21, 2021    OV 02/06/2021  Subjective:  [name], male , DOB: 13-Dec-1940 , age 66 y.o. , MRN: [id number] , ADDRESS: [address] [place] [address] PCP Blair Heys, MD Patient Care Team: Blair Heys, MD as PCP - General (Family Medicine)  This Provider for this visit: Treatment Team:  Attending Provider: Kalman Shan, MD    02/06/2021 -   Chief Complaint  Patient presents with   Follow-up    Esbriet follow up, cough in the morning    Idiopathic pulmonary fibrosis on nintedanib 100 mg twice daily since Oct 2021   -last CT September 2022, pft oct 2022 -> progression on CT scan  -Stop nintedanib September 2022 due to weight loss and intolerance  -Started pirfenidone October 2022 Weight loss unintentional even before nintedanib  -  Improved September/October 2022 after stopping nintedanib Cough and Dysphagia  -Normal endoscopy with Dr. Loreta Ave September 2021/October 2021  -Abnormal barium study September 2022  HPI Jeff Willis 82 y.o. -returns for follow-up.  This is a head of his trip to [place] February 21, 2021.  He will only be back after 6 months in March /April 2023.  He tells me overall he stable.  His symptom score is stable.  Very little to no dyspnea.  He continues to have dysphagia and his chronic cough.  A year ago his endoscopy was normal.  He saw ENT because of his cough they did a barium study.  As barium study was done end of September 2022 and the suggestion of cervical web on the barium study.  He asked me to call Dr. Loreta Ave his GI.  I called her.  She said she will work him in.  He might need another endoscopy to rule out cervical web.  We discussed cervical web with the help of Google images.  Currently he is only bothered by cough especially early in the morning but this cough is stable as can be seen in the symptom score.  There is no dyspnea.  He came back from [place].  He plans to go to [place] February 21, 2021 and will stay there through March April 2023.  He is agreed for a telephone visit while in [place].  He has a pulmonologist in [place].  His walking desaturation test is stable except for mild tachycardia.  No shortness of breath.  He says he had liver function test through his primary care and this was normal.  He does not want to be checked today.    OV 09/07/2021  Subjective:  [name], male , DOB: July 24, 1940 , age 9 y.o. , MRN: [id number] , ADDRESS: [address] [place] [address] PCP Blair Heys, MD Patient Care Team: Blair Heys, MD as PCP - General (Family Medicine)  This Provider for this visit: Treatment Team:  Attending Provider: Kalman Shan, MD    09/07/2021 -   Chief Complaint  Patient presents with   Follow-up    PFT performed today.      HPI Deuntae G Tonner 82 y.o. -returns for follow-up.  Last seen in October 2022.  Then in November 2023 went to [place] to [place].  He returned in March 2023.  While in [place] he says  overall he did not have any problems.  He was continuing his pirfenidone.  However he lost his weight.  He did then pause his pirfenidone for 3 weeks and his weight improved.  Then after coming here he started losing weight again.  He could not figure out if it is because of dysphagia which had also started versus pirfenidone.  He then has reduced his pirfenidone to 2 pills 3 times daily.  At this point in time is not interested in clinical trials.  He says he wants to avoid many medications.  Simultaneously has been dealing with dysphagia particular with solids and spices.  Also hard food.  He is clearing his throat quite frequently although he still rates it as level 1 out of 5.  While in [place] May 04, 2021 he had PET scan.  He showed me the images of the PET scan.  We can upload the CD-ROM in the PACS system.  In the entire left lower lobe there is slightly increased uptake for 2.7.  This is consistent with the fibrotic tissue there  is also low uptake in the mediastinal adenopathy which is considered to be reactive.  He did not want any quick follow-up of his CT scan.  He is okay to have a CT scan in September 2023.  He wants avoid radiation exposure.   On August 10, 2021 at [facility] long Dr. Jacques Navy performed status post dilatation of esophageal stenosis.  I reviewed the records.  Of note: He plans to go to [place] in October 2023.  He wants his next follow-up in September 23.  In August 2023 is going with a lot of people to [place]       PFT      OV 02/25/2022  Subjective:  [name], male , DOB: Aug 27, 1940 , age 40 y.o. , MRN: [id number] , ADDRESS: [address] [place] [address] PCP Blair Heys, MD Patient Care Team: Blair Heys, MD as PCP - General (Family Medicine)  This Provider for this visit: Treatment Team:  Attending Provider: Kalman Shan, MD    02/25/2022 -   Chief Complaint  Patient presents with   Follow-up    PFT performed  today.  Pt states he is slowly getting better after recent hospital stay.    HPI Juluis G Messineo 82 y.o. -returns for follow-up.  Over the summer 2023 got hospitalized for COVID-19 related respiratory failure and IPF flareup and ARDS.  He then had a complication by aspiration for which she did not get intubated according to the son.  He then spent time in inpatient rehab and is now home for the last 3 weeks.  At home he is continue to improve.  He is on a chronic prednisone taper with Bactrim.  At this point in time he is not needing oxygen at rest based on home pulse oximetry monitoring.  Here we walked him 5 feet and back on room air and he did not desaturate below 94%.  Recent liver function test normal.  Anemia also improving.   He has had continued weight loss although he has gained weight since the hospitalization compared to prehospitalization this further weight loss to 114 pounds.  Some of this was pirfenidone mediated.  He is now back on the pirfenidone for the last few weeks.  They also feel that some of the weight loss is because he is not able to take enough food because of the dysphagia and the cough.  He continues to be physically deconditioned but even that he has improved.  He is able to do some ADLs.  He is able to take shower with the help of his wife.  He is able to walk within the house although still a little bit wobbly   He now has a chronic Foley which she is not able to get rid of after the hospitalization.  He follows with Dr. Mena Goes neurology.   The main issue right now is his cough and clearing of the throat.  The 2 are slightly unrelated.  He coughs when he eats food.  He also coughs when he clears her throat.  He throats clears his throat independent of the cough.  Eating food makes him clear the throat more. .  At night he does not clear the throat.  He has not seen ENT in 2 years.  There is no history of difficult intubation.  He and her son feel it is related to  his dysphagia issues.  Mid November 2023 he is being scheduled for esophageal dilatation by Dr. Jeani Hawking.  I did tell them that the risk for endoscopy is acceptable given the fact he was able to walk 5-10 feet without any desaturation on room air ..  I supported him having the endoscopy.  They believe there might be some sinus drainage.  Did encourage him to try over-the-counter Flonase and also Mucinex.   He did have some back pain in the hospital and was started on gabapentin.  Is clearly not helping his cough or clearing of the throat.  He is back pain is not much of an issue.  We took a shared decision making he can come off this.      04/01/2022 Patient presents today for follow-up.  She has a history of IPF recently diagnosed with community-acquired pneumonia.  Chest x-ray on 03/20/2022 showed increased left lung opacity.  He was treated with liquid Augmentin x 7 days. Cough is worse the last several days. Son reports that he has several distince coughs. No associated shortness of breath. He has new swelling in feet last 4-5 days. Constantly clearing his throat, aggressively trys to cough up mucus but cough is mainly dry. When he lays flat he does not cough.  He is compliant with Esbriet and taking prednisone every other day. No increased oxygen demands. During last office visit with Dr. Marchelle Gearing he was referred to ENT due to cough to rule out postnasal drip as cause. He has not been contacted to schedule visit.  He had an EGD in November which showed esophageal stenosis.  Takes Protonix twice daily, needs refill.       OV 06/20/2022  Subjective:  [name], male , DOB: 05/21/1940 , age 88 y.o. , MRN: [id number] , ADDRESS: [address] [place] [address] PCP Blair Heys, MD Patient Care Team: Blair Heys, MD as PCP - General (Family Medicine) Lennette Bihari, MD as PCP - Cardiology (Cardiology)  This Provider for this visit: Treatment Team:   Attending Provider: Kalman Shan, MD   06/20/2022 -visit after exposure to flulike illness.   HPI Fern G Cotta 82 y.o. -presents with son benign.  This is an acute visit.  He has full schedule regular visit coming up.  Some 3-4 days ago son thought he had the flu.  Initially said he had the flu but later said he did not test himself for the flu but he was flulike illness.  The daughter-in-law who also lives in the house got similar symptoms then 2 days ago patient started with body ache and cough and wet cough that is increased compared to baseline.  The sputum was clear in color and there is no fever or chills.  There is no runny nose.  Currently he is improving actually.  His cough intensity has come down his body ache is come down.  He is to do pulmonary rehabilitation and is not desaturating but he still uses his oxygen for psychological comfort.  He did tell me that when he goes upstairs he goes down to 88-89% but this is baseline.  He does not think he is desaturating currently.  We did a rapid flu test and it was negative.  He is continue to lose weight.  He had endoscopy in November 2023 for esophageal stenosis.  Frequent scheduled endoscopy at [facility] is pending by Dr. Jeani Hawking.  He is continue to lose weight.      OV 07/02/2022  Subjective:  [name], male , DOB: Sep 19, 1940 , age 42  y.o. , MRN: [id number] , ADDRESS: [address] [place] [address] PCP Blair Heys, MD Patient Care Team: Blair Heys, MD as PCP - General (Family Medicine) Lennette Bihari, MD as PCP - Cardiology (Cardiology)  This Provider for this visit: Treatment Team:  Attending Provider: Kalman Shan, MD   07/02/2022 -   Chief Complaint  Patient presents with   Follow-up    PFT     HPI Tywone G Artz 82 y.o. -returns for follow-up.  I recently saw him a few weeks ago because of exposure to flulike illness.  He states he is  back to baseline.  However does appear to have lost 5 more pounds of weight in the last 2 weeks.  His son is surprised by this.  Patient does admit to low appetite.  However he is known to deal with esophageal stenosis.  The son did indicate to me at last visit and again this visit that Dr. Jeani Hawking is considering sequential endoscopy with sequential dilatation at specific interval.  Apparently this has been hard to schedule with the endoscopy suite at [facility] long.  At the request I did reach to Roselie Awkward the director for the endoscopy suite to work with Dr. Jeani Hawking to make sure this was possible because of his ongoing weight loss.  Son is also not sure whether it is accurate weight loss of 5 pounds in the last 2 weeks.  Therefore I asked them to track dry weight on a scheduled basis at a specific fix time.  We did discuss about the possibility of pirfenidone playing a role in the weight loss.  They are also having a swallowing evaluation coming up at [facility]   He did have lung function test today and overall there is a 35% decline in [facility] compared to May 2023 [his COVID was in July August 2023].  There is at least 14% progression since fall 2023.  They are surprised by the decline in PFT because they said that he is actually doing better his cough is better and his symptom scores are better.  He does have oxygen but he states that rest he does not need it.  Also nighttime he is not using it consistently and does not feel the need to.  They say when they checked the pulse ox when he is exerting or sitting still it is stable.  Overall son and he were in alignment that ever since COVID in summer 2023 his overall health status is declined. CT Chest data OV 09/12/2022  Subjective:  [name], male , DOB: [birth year] , age 26 y.o. , MRN: [id number] , ADDRESS: [address] [place] [address] PCP Blair Heys, MD Patient Care Team: Blair Heys, MD as PCP -  General (Family Medicine) Lennette Bihari, MD as PCP - Cardiology (Cardiology) Mealor, Roberts Gaudy, MD as PCP - Electrophysiology (Cardiology)  This Provider for this visit: Treatment Team:  Attending Provider: Kalman Shan, MD    09/12/2022 -   Chief Complaint  Patient presents with   Follow-up    F/up wet cough, thin and white mucous.       Idiopathic pulmonary fibrosis on nintedanib 100 mg twice daily since Oct 2021   -last CT September 2022, pft oct 2022 -> progression on CT scan  -Stop nintedanib September 2022 due to weight loss and intolerance  -Started pirfenidone October 2022 -> reduced to 2 pills 3 times daily March 2023 due to weight loss  IPF flareup due to COVID-19 summer 2023 followed by aspiration pneumonia and acute respiratory failure needing oxygen and intubation  -History of intubation  - Course complicated by aspiration  -On prednisone therapy since discharge  Weight loss unintentional even before nintedanib  -Improved September/October 2022 after stopping nintedanib  -relapse while in [place] but after starting pirfenidone in the fall 2022 and early 2023.  -Progressive weight loss after summer 2023 hospitalization for COVID-19  - June 2021: 137#  - 114# - oct 2023  - 110# 06/20/2022  -105# 07/02/2022 (son surpise)d  - 104# 09/12/2022    Cough and Dysphagia ad clearing throat  -Normal endoscopy with Dr. Loreta Ave September 2021/October 2021  -Abnormal barium study September 2022  -Status post endoscopy with esophageal stenosis dilatation by Dr. Elnoria Howard April 2023.  -Worse in October 2023 after some hospitalization for COVID  - s/p mild eso stenosis dilation 07/12/22   Normal right heart cath July 2023 without pulmonary hypertension.  HPI Jeff Willis 82 y.o. -returns for follow-up.  Presents with her son Jeff Willis.  He is not using oxygen anymore.  In fact he did a sit/stand test and he did not need oxygen.  Although he is sitting here with oxygen.  He  feels stable he is driving car.  However he continues to lose weight although the pace of the weight loss might of stabilized.  He continues his pirfenidone.  His main issues appear to be swallowing related.  He did have esophageal dilatation early March 2024 by Dr. Jeani Hawking.  Subsequent to that has seen ENT has had extensive evaluation at [facility].  The son showed me a video of a barium swallow test.  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  He needs to drink water to swallow the pill.  Referral has been made to [facility]..  Also in the last week he has had increased cough which appears to be wet but there is no fever or chills wheezing.  The son thinks is because he reduced his Protonix.  He wants to increase the Protonix.  If this does not work recommended a 5-day prednisone taper.  Also he plans to go to [place] in October 2024.  I cautioned him somewhat against this because of his frailty but decided will repeat pulmonary function test in September 2023 and decide.      SYMPTOM SCALE - ILD 10/05/2019 137 pounds 01/17/2020 134 pounds 03/01/2020 131# - start ofev oct 2021 07/24/2020 127# on ofev (nintib from [place] by [facility] x 2 months) 100mg  bid 09/22/2020 122# - on ofev 100mg  bid 01/11/2021 Tele visit - off ofev  128# not on any antifibrotic 02/06/2021 131# -esbriet x 1 week 09/07/2021 125#  02/25/2022 114#  06/20/2022 110# 07/02/2022 105# 09/12/2022 104#  O2 use ra ra ra ra ra  ra   ra ra  Shortness of Breath 0 -> 5 scale with 5 being worst (score 6 If unable to do)            At rest 0 0 0 0 0  0   0 0  Simple tasks - showers, clothes change, eating, shaving 0 0 0 1 0  0 0  1 1  Household (dishes, doing bed, laundry) 0 1 1  0 0  0 1.5  na na  Shopping 0 0 0 0   0 nan  na 1  Walking level at own pace 0 0 0 0 0  0 1  0 0  Walking up [employer]  1 0 1 0 0  0 2  1 1   Total (30-36) Dyspnea Score 1 1 2 1  0  0 3.5  2 3   How bad is your cough? 2 3 0 2 2  2 5   1.5 3  How bad is your fatigue 0 0 0  0 0  0 4  1.5 3  How bad is nausea 0 0 0 1 0  0 0  0 0  How bad is vomiting?  00 0 0 0 0  0 0  0 0  How bad is diarrhea? 0 0 0 3 3  0 0  0 0  How bad is anxiety? 00 1 1 1  0  1 1.5  1 1   How bad is depression 0 1 1 1  0  1 1.5  0 0         Simple office walk 185 feet x  3 laps goal with forehead probe 10/05/2019  03/01/2020  07/24/2020  09/22/2020  02/06/2021  09/07/2021  09/12/2022   O2 used ra ra  ra ra ra ra  Number laps completed 3   3   Sit stand x 10 resp  Comments about pace avt   Mod paced avg avg steady  Resting Pulse Ox/HR 100% and 122/min 98% and 110 1-00% and 120 100% and 109/min 99% and 89 100% and 85 97-96% and HR 113  Final Pulse Ox/HR 100% and 143/min 97% and 130 99% and 130 99% and 124/min 99% and 114 98% ad 115 92-93% and HR 115  Desaturated </= 88% x x  no no  no  Desaturated <= 3% points x x  no no  yes  Got Tachycardic >/= 90/min x x  ye yes  yes  Symptoms at end of test x x  none none    Miscellaneous comments x x No dyspnea No dyspnea No dyspnea No dyspnea Mild 1 of 10 dyspnea     PFT     Latest Ref Rng & Units 09/02/2022    2:04 PM 07/01/2022   10:42 AM 02/25/2022    3:12 PM 09/07/2021   10:48 AM 02/05/2021   10:44 AM 09/22/2020    9:50 AM 01/17/2020   10:13 AM  PFT Results  FVC-Pre L 1.88  P 1.68  1.96  2.63  2.67  2.81    FVC-Predicted Pre % 60  P 54  63  84  85  89  83   FVC-Post L  1.60        FVC-Predicted Post %  51        Pre FEV1/FVC % % 94  P 94  89  85  87  87  86   Post FEV1/FCV % %  92        FEV1-Pre L 1.76  P 1.58  1.75  2.24  2.33  2.45  2.37   FEV1-Predicted Pre % 75  P 68  74  95  97  102  95   FEV1-Post L  1.47        DLCO uncorrected ml/min/mmHg 0.44  P  8.46  12.24  13.45  16.91  13.79   DLCO UNC% % 1  P  31  45  49  62  48   DLCO corrected ml/min/mmHg 0.44  P  9.22  12.24  13.45  16.91  13.79   DLCO COR %Predicted % 1  P  34  45  49  62  48  DLVA Predicted % 6  P  68  72  80  89  81   TLC L  3.54        TLC % Predicted %  56         RV % Predicted %  74          P Preliminary result       has a past medical history of Back pain, Elevated PSA, Essential tremor, Hypercholesteremia, Neuropathy, Prostate cancer (HCC), and Sinus tachycardia (01/30/2018).   reports that he has never smoked. He has never used smokeless tobacco.  Past Surgical History:  Procedure Laterality Date   COLONOSCOPY WITH PROPOFOL N/A 08/10/2021   Procedure: COLONOSCOPY WITH PROPOFOL;  Surgeon: Jeani Hawking, MD;  Location: [facility];  Service: Gastroenterology;  Laterality: N/A;   ESOPHAGEAL DILATION  08/10/2021   Procedure: ESOPHAGEAL DILATION;  Surgeon: Jeani Hawking, MD;  Location: [facility];  Service: Gastroenterology;;   ESOPHAGOGASTRODUODENOSCOPY (EGD) WITH PROPOFOL N/A 08/10/2021   Procedure: ESOPHAGOGASTRODUODENOSCOPY (EGD) WITH PROPOFOL;  Surgeon: Jeani Hawking, MD;  Location: [facility];  Service: Gastroenterology;  Laterality: N/A;   ESOPHAGOGASTRODUODENOSCOPY (EGD) WITH PROPOFOL N/A 03/12/2022   Procedure: ESOPHAGOGASTRODUODENOSCOPY (EGD) WITH PROPOFOL;  Surgeon: Jeani Hawking, MD;  Location: [facility];  Service: Gastroenterology;  Laterality: N/A;   ESOPHAGOGASTRODUODENOSCOPY (EGD) WITH PROPOFOL N/A 07/12/2022   Procedure: ESOPHAGOGASTRODUODENOSCOPY (EGD) WITH PROPOFOL;  Surgeon: Jeani Hawking, MD;  Location: [facility];  Service: Gastroenterology;  Laterality: N/A;   HEMOSTASIS CLIP PLACEMENT  08/10/2021   Procedure: HEMOSTASIS CLIP PLACEMENT;  Surgeon: Jeani Hawking, MD;  Location: [facility];  Service: Gastroenterology;;   LUMBAR MICRODISCECTOMY     L4-5   POLYPECTOMY  08/10/2021   Procedure: POLYPECTOMY;  Surgeon: Jeani Hawking, MD;  Location: [facility];  Service: Gastroenterology;;   RADIOACTIVE SEED IMPLANT N/A 02/24/2018   Procedure: RADIOACTIVE SEED IMPLANT/BRACHYTHERAPY IMPLANT;  Surgeon: Jerilee Field, MD;  Location: [facility];  Service: Urology;  Laterality: N/A;   RIGHT HEART CATH  N/A 11/22/2021   Procedure: RIGHT HEART CATH;  Surgeon: Lennette Bihari, MD;  Location: [facility] INVASIVE CV LAB;  Service: Cardiovascular;  Laterality: N/A;   SAVORY DILATION N/A 03/12/2022   Procedure: SAVORY DILATION;  Surgeon: Jeani Hawking, MD;  Location: [facility];  Service: Gastroenterology;  Laterality: N/A;   SAVORY DILATION N/A 07/12/2022   Procedure: SAVORY DILATION;  Surgeon: Jeani Hawking, MD;  Location: [facility];  Service: Gastroenterology;  Laterality: N/A;   SPACE OAR INSTILLATION N/A 02/24/2018   Procedure: SPACE OAR INSTILLATION;  Surgeon: Jerilee Field, MD;  Location: [facility];  Service: Urology;  Laterality: N/A;    Allergies  Allergen Reactions   Beef-Derived Products Other (See Comments)    Vegetarian   Chicken Protein Other (See Comments)    Vegetarian   Egg-Derived Products Other (See Comments)    Vegetarian   Fish-Derived Products Other (See Comments)    Vegetarian   Meat Extract Other (See Comments)    Vegetarian   Ofev [Nintedanib] Other (See Comments)    Weight loss   Pork-Derived Products Other (See Comments)    Vegetarian    Immunization History  Administered Date(s) Administered   Influenza, High Dose Seasonal PF 01/28/2019, 01/12/2021, 02/11/2022   Influenza-Unspecified 01/05/2020   PFIZER(Purple Top)SARS-COV-2 Vaccination 07/23/2019, 08/17/2019   Pneumococcal-Unspecified 02/28/2015   Zoster, Live 01/26/2021    Family History  Problem Relation Age of Onset   Other Mother        died at 68  of natural causes   Other Father        unsure of health   Cancer Neg Hx      Current Outpatient Medications:    aspirin EC 81 MG tablet, Take 81 mg by mouth daily. Swallow whole., Disp: , Rfl:    chlorpheniramine-HYDROcodone (TUSSIONEX) 10-8 MG/5ML, Take 5 mLs by mouth every 12 (twelve) hours as needed for cough., Disp: 300 mL, Rfl: 0   melatonin 3 MG TABS tablet, Take 1 tablet (3 mg total) by mouth at bedtime as needed., Disp: ,  Rfl: 0   metoprolol succinate (TOPROL-XL) 25 MG 24 hr tablet, Take 1 tablet (25 mg total) by mouth daily. Take with or immediately following a meal., Disp: 90 tablet, Rfl: 3   metoprolol succinate (TOPROL-XL) 50 MG 24 hr tablet, Take 1 tablet (50 mg total) by mouth daily. Take with or immediately following a meal., Disp: 90 tablet, Rfl: 3   Multiple Vitamin (MULTIVITAMIN WITH MINERALS) TABS tablet, Take 1 tablet by mouth daily., Disp: , Rfl:    OXYGEN, Inhale 1-2 L into the lungs continuous., Disp: , Rfl:    pantoprazole (PROTONIX) 40 MG tablet, Take 1 tablet (40 mg total) by mouth 2 (two) times daily. (Patient taking differently: Take 40 mg by mouth daily.), Disp: 30 tablet, Rfl: 2   Pirfenidone 267 MG CAPS, Take 534 mg by mouth in the morning, at noon, and at bedtime., Disp: 540 capsule, Rfl: 1   predniSONE (DELTASONE) 10 MG tablet, Take 3 tablets (30 mg total) by mouth daily with breakfast for 1 day, THEN 2 tablets (20 mg total) daily with breakfast for 1 day, THEN 1 tablet (10 mg total) daily with breakfast for 1 day, THEN 0.5 tablets (5 mg total) daily with breakfast for 1 day, THEN 0.5 tablets (5 mg total) daily with breakfast for 1 day., Disp: 7 tablet, Rfl: 0   rosuvastatin (CRESTOR) 10 MG tablet, TAKE ONE TABLET BY MOUTH DAILY, Disp: 90 tablet, Rfl: 3   Vitamin D, Ergocalciferol, (DRISDOL) 1.25 MG (50000 UNIT) CAPS capsule, Take 1 capsule (50,000 Units total) by mouth every 7 (seven) days., Disp: 5 capsule, Rfl: 0   furosemide (LASIX) 20 MG tablet, Take 1 tablet (20 mg total) by mouth daily as needed for edema., Disp: 30 tablet, Rfl: 1      Objective:   Vitals:   09/12/22 1547  BP: 120/70  Pulse: 73  SpO2: 98%  Weight: 104 lb 9.6 oz (47.4 kg)  Height: 5\' 5"  (1.651 m)    Estimated body mass index is 17.41 kg/m as calculated from the following:   Height as of this encounter: 5\' 5"  (1.651 m).   Weight as of this encounter: 104 lb 9.6 oz (47.4 kg).  @WEIGHTCHANGE @  [employer]    09/12/22 1547  Weight: 104 lb 9.6 oz (47.4 kg)     Physical Exam   General: No distress. thin Neuro: Alert and Oriented x 3. GCS 15. Speech normal Psych: Pleasant Resp:  Barrel Chest - no.  Wheeze - no, Crackles - YES, No overt respiratory distress CVS: Normal heart sounds. Murmurs - no Ext: Stigmata of Connective Tissue Disease - no HEENT: Normal upper airway. PEERL +. No post nasal drip        Assessment:       ICD-10-CM   1. Interstitial lung disease (HCC)  J84.9 Hepatic function panel    Pulmonary function test    Hepatic function panel    2. IPF (idiopathic  pulmonary fibrosis) (HCC)  J84.112     3. Weight loss, non-intentional  R63.4     4. Physical deconditioning  R53.81     5. Medication monitoring encounter  Z51.81          Plan:     Patient Instructions  IPF (idiopathic pulmonary fibrosis) (HCC) Chronic respiratory failure after COVID-19 related hospitalization summer 2023   -PFT decreased 36% between MAy 2023 -> March 2024 but now 09/12/2022 - improved/stable;  - resting pulse ox fine and no desats on sit-stand test   Plan -continue esbriet 2 pills three times daily with food (see disussion on weight loss below) --Monitor oxygen levels  -Goal pulse ox should be greater than 92% -LFT test 09/12/2022 - spirometry and dlco in 4 months before potential [place] trio   Weight loss, non-intentional  - losing weight again with esbriet xsince  02/06/2021 and subsequent weight loss in October 2023 to 114# following hospitalization and now 105# in March 2024 - could be esbiriet v  swallowing isues   Plan - check weight at home (naked weight at same time daily) -monitor for weight loss with esbriet for now -Continue high-protein diet as below -   COUGH and DYSPHAGIA and clearing throat New History of intubation - summer 2023  - - s/p dilatation by Dr Elnoria Howard April 2023 and march 2024  - ENT eval by Lerry Liner ongoging  - recent increase in cough - reason  unknown  Plan -per ENT and GI - 5 day pred taper if increased ppi does not work   Physical deconditioning - new since summer 2023  -Much improved after hospitalization and inpatient rehab and now driving but still deconditioned compared to baseline  Plan -Continue strengthening exercises -Continue high-protein vegetarian diet through legumes and tofu   Follow-up -16 weeks do spirometry and DLCO and return to see Dr. Marchelle Gearing for a 30-minute  - symptoms score and walk test on RA at followup  ( Level 05 visit: Estb 40-54 min   visit type: on-site physical face to visit  in total care time and counseling or/and coordination of care by this undersigned MD - Dr Kalman Shan. This includes one or more of the following on this same day 09/12/2022: pre-charting, chart review, note writing, documentation discussion of test results, diagnostic or treatment recommendations, prognosis, risks and benefits of management options, instructions, education, compliance or risk-factor reduction. It excludes time spent by the CMA or office staff in the care of the patient. Actual time 40 min)   SIGNATURE    Dr. Kalman Shan, M.D., F.C.C.P,  Pulmonary and Critical Care Medicine Staff Physician, [facility] Director - Interstitial Lung Disease  Program  Pulmonary Fibrosis [facility] at [facility] [place], [place], [address]  Pager: [phone], If no answer or between  15:00h - 7:00h: call [phone] Telephone: [phone]  5:58 PM 09/12/2022

## 2022-09-12 NOTE — Patient Instructions (Addendum)
IPF (idiopathic pulmonary fibrosis) (HCC) Chronic respiratory failure after COVID-19 related hospitalization summer 2023   -PFT decreased 36% between MAy 2023 -> March 2024 but now 09/12/2022 - improved/stable;  - resting pulse ox fine and no desats on sit-stand test   Plan -continue esbriet 2 pills three times daily with food (see disussion on weight loss below) --Monitor oxygen levels  -Goal pulse ox should be greater than 92% -LFT test 09/12/2022 - spirometry and dlco in 4 months before potential [place] trio   Weight loss, non-intentional  - losing weight again with esbriet xsince  02/06/2021 and subsequent weight loss in October 2023 to 114# following hospitalization and now 105# in March 2024 - could be esbiriet v  swallowing isues   Plan - check weight at home (naked weight at same time daily) -monitor for weight loss with esbriet for now -Continue high-protein diet as below -   COUGH and DYSPHAGIA and clearing throat New History of intubation - summer 2023  - - s/p dilatation by Dr Elnoria Howard April 2023 and march 2024  - ENT eval by Lerry Liner ongoging  - recent increase in cough - reason unknown  Plan -per ENT and GI - 5 day pred taper if increased ppi does not work   Physical deconditioning - new since summer 2023  -Much improved after hospitalization and inpatient rehab and now driving but still deconditioned compared to baseline  Plan -Continue strengthening exercises -Continue high-protein vegetarian diet through legumes and tofu   Follow-up -16 weeks do spirometry and DLCO and return to see Dr. Marchelle Gearing for a 30-minute  - symptoms score and walk test on RA at followup

## 2022-09-13 LAB — HEPATIC FUNCTION PANEL
ALT: 18 U/L (ref 0–53)
AST: 25 U/L (ref 0–37)
Albumin: 3.6 g/dL (ref 3.5–5.2)
Alkaline Phosphatase: 52 U/L (ref 39–117)
Bilirubin, Direct: 0.1 mg/dL (ref 0.0–0.3)
Total Bilirubin: 0.3 mg/dL (ref 0.2–1.2)
Total Protein: 7.6 g/dL (ref 6.0–8.3)

## 2022-09-17 ENCOUNTER — Telehealth: Payer: Self-pay | Admitting: Pharmacist

## 2022-09-17 DIAGNOSIS — J84112 Idiopathic pulmonary fibrosis: Secondary | ICD-10-CM

## 2022-09-17 MED ORDER — PIRFENIDONE 267 MG PO CAPS: 534.0000 mg | ORAL_CAPSULE | Freq: Three times a day (TID) | ORAL | 1 refills | Status: DC

## 2022-09-17 NOTE — Telephone Encounter (Signed)
Refill for Esbriet sent to [facility]  Dose: 535mg  three times daily  LFTs 09/12/22 wnl  Chesley Mires, PharmD, MPH, BCPS, CPP Clinical Pharmacist (Rheumatology and Pulmonology)

## 2022-10-21 ENCOUNTER — Encounter: Payer: Self-pay | Admitting: Cardiovascular Disease

## 2022-10-21 ENCOUNTER — Ambulatory Visit: Payer: Medicare Other | Attending: Cardiovascular Disease | Admitting: Cardiovascular Disease

## 2022-10-21 VITALS — BP 114/72 | HR 57 | Ht 66.0 in | Wt 107.0 lb

## 2022-10-21 DIAGNOSIS — J84112 Idiopathic pulmonary fibrosis: Secondary | ICD-10-CM | POA: Diagnosis not present

## 2022-10-21 DIAGNOSIS — I4719 Other supraventricular tachycardia: Secondary | ICD-10-CM

## 2022-10-21 DIAGNOSIS — I7121 Aneurysm of the ascending aorta, without rupture: Secondary | ICD-10-CM

## 2022-10-21 DIAGNOSIS — I2510 Atherosclerotic heart disease of native coronary artery without angina pectoris: Secondary | ICD-10-CM

## 2022-10-21 DIAGNOSIS — U071 COVID-19: Secondary | ICD-10-CM

## 2022-10-21 DIAGNOSIS — E785 Hyperlipidemia, unspecified: Secondary | ICD-10-CM

## 2022-10-21 DIAGNOSIS — I2584 Coronary atherosclerosis due to calcified coronary lesion: Secondary | ICD-10-CM

## 2022-10-21 NOTE — Progress Notes (Unsigned)
Cardiology Office Note    Date:  10/24/2022   ID:  ZUBAYR BEDNARCZYK, DOB [birth year], MRN [id number]  PCP:  Blair Heys, MD  Cardiologist:  Nicki Guadalajara, MD   11 month F/U cardiology evaluation initially referred through the courtesy of Dr. Blair Heys  History of Present Illness:  Momodou SHAQUIL ALDANA is a 82 y.o. male who is originally from [place] but has been living in [place] for 51 years.  He has been followed by Dr. Marchelle Gearing and is felt to have probable interstitial lung disease.  A high-resolution CT scan of his chest showed probable UIP pattern with possible early honeycombing.  He also was noted to have an ectatic 4.5 cm ascending thoracic aorta and aortic atherosclerosis involving the left main and two-vessel CAD. He recently saw Dr. Manus Gunning and has had issues with elevated heart rate that seems to have been fairly consistent over several office visits.  He was referred for an echo Doppler study on Sep 07, 2020 by Dr. Marchelle Gearing EF of 60 to 65% without wall motion abnormalities.  Right ventricular size was normal.  There was mildly elevated pulmonary artery pressure.  Estimated RV systolic pressure was 34.8.  There was mild to moderate tricuspid regurgitation.  His aortic valve was trileaflet and there was no stenosis.  When I saw him for initial evaluation on November 29, 2020 he admitted to a longstanding history of increased heart rate which often was contributed by anxiety.  He was typically drinking tea approximately 2 times per day.  He typically walks 30 minutes 4 days/week and denies any exertional anginal symptomatology.  He specifically denies any chest tightness or exertional shortness of breath.  He denies any presyncope or syncope.  He is unaware of any history of atrial fibrillation.  He had been treated with Ofev for his probable interstitial lung disease but due to significant reduction in appetite this was discontinued approximately 4-1/2 months ago.  At his  initial evaluation, I reviewed the investigations by Dr. Marchelle Gearing as well as Dr. Bennie Hind.  Prior lipid studies had shown an LDL of 133.  I recommended he undergo follow-up laboratory with a comprehensive metabolic panel, magnesium level, free T3 and free T4 CBC and lipid studies.  I initiated metoprolol succinate with low-dose with plans to titrate as necessary and recommended he undergo coronary CTA for further evaluation of his CT detected coronary atherosclerosis.  Coronary CTA was done on December 08, 2020 which revealed a calcium score of 18 placing him in only the 9th percentile for age and sex matched control.  He had right dominant coronary arteries with minimal atherosclerotic plaque in the mid RCA less than 25%, distal left main less than 25%, 25 to 49% in the LAD, and less than 25% in the circumflex vessel.  Chest over read showed interstitial lung disease with possible progressive groundglass in the lower lobes.  There also was a 1 cm central left upper lobe pulmonary nodule.  Ascending aortic aneurysm was 4.2 cm.  Chemistry and thyroid laboratory were essentially normal,however, total cholesterol was 208 and LDL 129 with HDL of 60..  Presently, he feels well.  His resting pulse has improved with metoprolol succinate 25 mg daily.  He is on rosuvastatin 10 mg, and Pepcid 40 mg.  He continues to be on Esbriet for his interstitial lung disease and baby aspirin.  Since I  saw him on February 05, 2021 he and his wife went to [place] for 4 months.  They returned  last week.  I saw him in follow-up on July 12, 2021 at which time he continued to feel well and denied any chest pain or shortness of breath.  He denied palpitations, presyncope or syncope.  He continues to be active.  He is on metoprolol succinate 25 mg daily in addition to rosuvastatin 10 mg and aspirin.  He takes Protonix for GERD.  During that evaluation I recommended he continue taking metoprolol succinate 25 mg daily particularly with his mild  dilatation of ascending aorta at 43 mm.  He denied any significant wheezing or shortness of breath.  His GERD was controlled with Protonix and he was tolerating rosuvastatin 10 mg.  He subsequently was reevaluated by Dr. Marchelle Gearing in May 2023 for his interstitial lung disease/idiopathic pulmonary fibrosis.  His last CT in September 2022 suggested progression on CT scan and his prior treatment with nintedanib 100 mg twice a day and it was discontinued and he was changed to pirfenidone.  Since I saw him, he was referred for a follow-up echo Doppler study on October 10, 2021.  This showed hyperdynamic LV function with EF 65 to 70% with grade 1 diastolic dysfunction.  On this echo his RV systolic pressure had risen and was now felt to be 39.2 mmHg.  As result of this increase in pulmonary pressure, it was recommended by Dr. Marchelle Gearing that he undergo a right heart cardiac catheterization.  I last saw him on November 14, 2021 when he presented to the office to discuss this need for right heart catheterization with ultimate scheduling.  He denies chest pain.  He is unaware of audible wheezing.  He denies palpitations.  He continues to be on pirfenidone 267 mg 2 tablets in the morning, at noon, and at bedtime, metoprolol succinate 25 mg daily, aspirin 81 mg, and rosuvastatin 10 mg.    He underwent right and left heart cardiac catheterization on November 22, 2021 which showed RA pressure A-wave 3, V wave to; RV 31/6, PA 30/7, mean 8.  O2 saturation in the PA was 67% and AO 97%.  By the [facility] method cardiac output was 3.9 L/min with cardiac index 2.4 L/min/m.  PVR was 2.6 [place]  In September 2023, he underwent extensive hospitalization and presented on December 31, 2021 with hypoxia, tachycardia, low-grade fevers due to COVID which she developed towards the end of the cruise.  He required prolonged intubation and had significant issues of hypoglycemia, acute hypoxic respiratory failure due to aspiration pneumonia with pneumonitis,  urinary retention as well as abdominal distention.  He was subsequently admitted to rehab from September 22 through January 28, 2022.  He was seen in cardiology follow-up on May 10, 2022 by Gillian Shields and at that time was feeling well and denied chest pain or shortness of breath.  At times he had noted some recurrence of tachycardia with rates up to 130.  He was subsequently evaluated by Dr. Nelly Laurence of EP.  Review of tracings that time showed atrial tachycardia with a rate at 129.  He was not significantly symptomatic and continued beta-blocker with metoprolol was recommended.  Presently, Mr. Maya feels significantly improved.  He denies chest pain or significant shortness of breath.  He has been documented to have mild dilation of his ascending aorta at 4.5 cm on prior CT imaging in December 2023.  He is scheduled to undergo repeat CT angio of his chest and aorta on October 25, 2022.  He is here today with his brother.  He continues to  be on metoprolol succinate 75 mg daily without recurrent tachycardic events.  He is on her pirfenidone for his idiopathic pulmonary fibrosis.  He has a prescription for furosemide to take as needed.  He is on rosuvastatin 10 mg daily for hyperlipidemia.  He presents for follow-up Cardiologic evaluation.   Past Medical History:  Diagnosis Date   Back pain    Elevated PSA    Essential tremor    Hypercholesteremia    Neuropathy    Prostate cancer (HCC)    Sinus tachycardia 01/30/2018    Past Surgical History:  Procedure Laterality Date   COLONOSCOPY WITH PROPOFOL N/A 08/10/2021   Procedure: COLONOSCOPY WITH PROPOFOL;  Surgeon: Jeani Hawking, MD;  Location: [facility];  Service: Gastroenterology;  Laterality: N/A;   ESOPHAGEAL DILATION  08/10/2021   Procedure: ESOPHAGEAL DILATION;  Surgeon: Jeani Hawking, MD;  Location: [facility];  Service: Gastroenterology;;   ESOPHAGOGASTRODUODENOSCOPY (EGD) WITH PROPOFOL N/A 08/10/2021   Procedure:  ESOPHAGOGASTRODUODENOSCOPY (EGD) WITH PROPOFOL;  Surgeon: Jeani Hawking, MD;  Location: [facility];  Service: Gastroenterology;  Laterality: N/A;   ESOPHAGOGASTRODUODENOSCOPY (EGD) WITH PROPOFOL N/A 03/12/2022   Procedure: ESOPHAGOGASTRODUODENOSCOPY (EGD) WITH PROPOFOL;  Surgeon: Jeani Hawking, MD;  Location: [facility];  Service: Gastroenterology;  Laterality: N/A;   ESOPHAGOGASTRODUODENOSCOPY (EGD) WITH PROPOFOL N/A 07/12/2022   Procedure: ESOPHAGOGASTRODUODENOSCOPY (EGD) WITH PROPOFOL;  Surgeon: Jeani Hawking, MD;  Location: [facility];  Service: Gastroenterology;  Laterality: N/A;   HEMOSTASIS CLIP PLACEMENT  08/10/2021   Procedure: HEMOSTASIS CLIP PLACEMENT;  Surgeon: Jeani Hawking, MD;  Location: [facility];  Service: Gastroenterology;;   LUMBAR MICRODISCECTOMY     L4-5   POLYPECTOMY  08/10/2021   Procedure: POLYPECTOMY;  Surgeon: Jeani Hawking, MD;  Location: [facility];  Service: Gastroenterology;;   RADIOACTIVE SEED IMPLANT N/A 02/24/2018   Procedure: RADIOACTIVE SEED IMPLANT/BRACHYTHERAPY IMPLANT;  Surgeon: Jerilee Field, MD;  Location: [facility];  Service: Urology;  Laterality: N/A;   RIGHT HEART CATH N/A 11/22/2021   Procedure: RIGHT HEART CATH;  Surgeon: Lennette Bihari, MD;  Location: [facility];  Service: Cardiovascular;  Laterality: N/A;   SAVORY DILATION N/A 03/12/2022   Procedure: SAVORY DILATION;  Surgeon: Jeani Hawking, MD;  Location: [facility];  Service: Gastroenterology;  Laterality: N/A;   SAVORY DILATION N/A 07/12/2022   Procedure: SAVORY DILATION;  Surgeon: Jeani Hawking, MD;  Location: [facility];  Service: Gastroenterology;  Laterality: N/A;   SPACE OAR INSTILLATION N/A 02/24/2018   Procedure: SPACE OAR INSTILLATION;  Surgeon: Jerilee Field, MD;  Location: [facility];  Service: Urology;  Laterality: N/A;    Current Medications: Outpatient Medications Prior to Visit  Medication Sig Dispense Refill   aspirin  EC 81 MG tablet Take 81 mg by mouth daily. Swallow whole.     metoprolol succinate (TOPROL-XL) 25 MG 24 hr tablet Take 1 tablet (25 mg total) by mouth daily. Take with or immediately following a meal. 90 tablet 3   metoprolol succinate (TOPROL-XL) 50 MG 24 hr tablet Take 1 tablet (50 mg total) by mouth daily. Take with or immediately following a meal. 90 tablet 3   Multiple Vitamin (MULTIVITAMIN WITH MINERALS) TABS tablet Take 1 tablet by mouth daily.     pantoprazole (PROTONIX) 40 MG tablet Take 1 tablet (40 mg total) by mouth 2 (two) times daily. (Patient taking differently: Take 40 mg by mouth daily.) 30 tablet 2   Pirfenidone 267 MG CAPS Take 534 mg by mouth in the morning, at noon, and at  bedtime. 540 capsule 1   pregabalin (LYRICA) 50 MG capsule Take 50 mg by mouth 2 (two) times daily. Patient takes 100 mg at night and 50 mg daily     rosuvastatin (CRESTOR) 10 MG tablet TAKE ONE TABLET BY MOUTH DAILY 90 tablet 3   Vitamin D, Ergocalciferol, (DRISDOL) 1.25 MG (50000 UNIT) CAPS capsule Take 1 capsule (50,000 Units total) by mouth every 7 (seven) days. 5 capsule 0   chlorpheniramine-HYDROcodone (TUSSIONEX) 10-8 MG/5ML Take 5 mLs by mouth every 12 (twelve) hours as needed for cough. (Patient not taking: Reported on 10/21/2022) 300 mL 0   furosemide (LASIX) 20 MG tablet Take 1 tablet (20 mg total) by mouth daily as needed for edema. (Patient not taking: Reported on 10/21/2022) 30 tablet 1   melatonin 3 MG TABS tablet Take 1 tablet (3 mg total) by mouth at bedtime as needed. (Patient not taking: Reported on 10/21/2022)  0   mirtazapine (REMERON) 15 MG tablet Take 15 mg by mouth at bedtime. (Patient not taking: Reported on 10/21/2022)     OXYGEN Inhale 1-2 L into the lungs continuous. (Patient not taking: Reported on 10/21/2022)     No facility-administered medications prior to visit.     Allergies:   Beef-derived products, Chicken protein, Egg-derived products, Fish-derived products, Meat extract, Ofev  [nintedanib], and Pork-derived products   Social History   Socioeconomic History   Marital status: Married    Spouse name: Not on file   Number of children: 3   Years of education: Masters   Highest education level: Not on file  Occupational History   Occupation: Armed forces technical officer    Comment: retired  Tobacco Use   Smoking status: Never   Smokeless tobacco: Never  Building services engineer Use: Never used  Substance and Sexual Activity   Alcohol use: No   Drug use: No   Sexual activity: Yes  Other Topics Concern   Not on file  Social History Narrative   Lives at home with his family.   Right-handed.   No caffeine use.   One daughter and two sons.   Social Determinants of Health   Financial Resource Strain: Not on file  Food Insecurity: No Food Insecurity (01/07/2022)   Hunger Vital Sign    Worried About Running Out of Food in the Last Year: Never true    Ran Out of Food in the Last Year: Never true  Transportation Needs: No Transportation Needs (01/07/2022)   PRAPARE - Administrator, Civil Service (Medical): No    Lack of Transportation (Non-Medical): No  Physical Activity: Not on file  Stress: Not on file  Social Connections: Not on file     He is originally from [place], [place].  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  Prior to COVID he traveled extensively.  He is married for 59 years.  He has 3 children.  There is no Bacot use or alcohol use.  He is semiretired.  Family History:  The patient's family history includes Other in his father and mother.   His mother died at age 36.  Father died at age 36 and had pneumonia and fever.  He has a brother living at 32.  He has 1 living sister who is 83.  2 sisters are deceased 1 at age 35 after a fall fracture and another at age 1.  ROS General: Negative; No fevers, chills, or night sweats;  HEENT: Negative; No changes in  vision or hearing, sinus congestion, difficulty swallowing Pulmonary:  Idiopathic pulmonary fibrosis Cardiovascular: See HPI GI: Negative; No nausea, vomiting, diarrhea, or abdominal pain GU: Negative; No dysuria, hematuria, or difficulty voiding Musculoskeletal: Negative; no myalgias, joint pain, or weakness Hematologic/Oncology: Negative; no easy bruising, bleeding Endocrine: Negative; no heat/cold intolerance; no diabetes Neuro: Negative; no changes in balance, headaches Skin: Negative; No rashes or skin lesions Psychiatric: Negative; No behavioral problems, depression Sleep: Negative; No snoring, daytime sleepiness, hypersomnolence, bruxism, restless legs, hypnogognic hallucinations, no cataplexy Other comprehensive 14 point system review is negative.   PHYSICAL EXAM:   VS:  BP 114/72 (BP Location: Left Arm, Patient Position: Sitting, Cuff Size: Small)   Pulse (!) 57   Ht 5\' 6"  (1.676 m)   Wt 107 lb (48.5 kg)   SpO2 94%   BMI 17.27 kg/m     Repeat blood pressure by me was 106/68  Wt Readings from Last 3 Encounters:  10/21/22 107 lb (48.5 kg)  09/12/22 104 lb 9.6 oz (47.4 kg)  08/13/22 106 lb (48.1 kg)   General: Alert, oriented, no distress.  Skin: normal turgor, no rashes, warm and dry HEENT: Normocephalic, atraumatic. Pupils equal round and reactive to light; sclera anicteric; extraocular muscles intact;  Nose without nasal septal hypertrophy Mouth/Parynx benign; Mallinpatti scale Neck: No JVD, no carotid bruits; normal carotid upstroke Lungs: Relatively clear no wheezing Chest wall: without tenderness to palpitation Heart: PMI not displaced, RRR, s1 s2 normal, 1/6 systolic murmur, no diastolic murmur, no rubs, gallops, thrills, or heaves Abdomen: soft, nontender; no hepatosplenomehaly, BS+; abdominal aorta nontender and not dilated by palpation. Back: no CVA tenderness Pulses 2+ Musculoskeletal: full range of motion, normal strength, no joint deformities Extremities: no clubbing cyanosis or edema, Homan's sign negative  Neurologic:  grossly nonfocal; Cranial nerves grossly wnl Psychologic: Normal mood and affect    Studies/Labs Reviewed:   EKG Interpretation Date/Time:  Monday October 21 2022 16:30:58 EDT Ventricular Rate:  60 PR Interval:  178 QRS Duration:  76 QT Interval:  422 QTC Calculation: 422 R Axis:   -15  Text Interpretation: Normal sinus rhythm Possible Left atrial enlargement Low voltage QRS Septal infarct (cited on or before 21-Oct-2022) When compared with ECG of 01-Jan-2022 06:20, Sinus rhythm has replaced Ectopic atrial rhythm Vent. rate has decreased BY  48 BPM Questionable change in initial forces of Anteroseptal leads ST no longer elevated in Inferior leads Nonspecific T wave abnormality no longer evident in Lateral leads Confirmed by Nicki Guadalajara ([id number]) on 10/24/2022 11:22:52 AM    November 14, 2021 ECG (independently read by me): NSR at 70, no ectopy, normal intervals  July 12, 2021 ECG (independently read by me): NSR at 71, possible LA enlargement  February 05, 2021 ECG (independently read by me): NSR at 82, Possible LA enlargement  November 29, 2020 ECG (independently read by me): Sinus tachycardia at 108; possible LAE, no ectopy  Recent Labs:    Latest Ref Rng & Units 05/10/2022    3:42 PM 04/02/2022   10:59 AM 01/28/2022    7:06 AM  BMP  Glucose 70 - 99 mg/dL 403  474  94   BUN 8 - 27 mg/dL 11  9  18    Creatinine 0.76 - 1.27 mg/dL 2.59  5.63  8.75   BUN/Creat Ratio 10 - 24 13     Sodium 134 - 144 mmol/L 134  136  133   Potassium 3.5 - 5.2 mmol/L 4.6  3.7  4.0   Chloride 96 -  106 mmol/L 96  100  99   CO2 20 - 29 mmol/L 26  31  28    Calcium 8.6 - 10.2 mg/dL 9.6  9.4  9.1         Latest Ref Rng & Units 09/12/2022    4:32 PM 05/10/2022    3:42 PM 04/02/2022   10:59 AM  Hepatic Function  Total Protein 6.0 - 8.3 g/dL 7.6  7.7  7.0   Albumin 3.5 - 5.2 g/dL 3.6  3.8  3.3   AST 0 - 37 U/L 25  22  16    ALT 0 - 53 U/L 18  10  10    Alk Phosphatase 39 - 117 U/L 52  63  40   Total Bilirubin 0.2  - 1.2 mg/dL 0.3  0.3  0.4   Bilirubin, Direct 0.0 - 0.3 mg/dL 0.1          Latest Ref Rng & Units 05/10/2022    3:42 PM 04/02/2022   10:59 AM 01/28/2022    7:06 AM  CBC  WBC 3.4 - 10.8 x10E3/uL 8.4  9.8  7.1   Hemoglobin 13.0 - 17.7 g/dL 29.5  28.4  13.2   Hematocrit 37.5 - 51.0 % 39.0  35.2  33.8   Platelets 150 - 450 x10E3/uL 231  229.0  127    Lab Results  Component Value Date   MCV 94 05/10/2022   MCV 95.3 04/02/2022   MCV 94.2 01/28/2022   Lab Results  Component Value Date   TSH 9.910 (H) 07/09/2022   Lab Results  Component Value Date   HGBA1C 5.8 (H) 01/01/2022     BNP No results found for: "BNP"  ProBNP    Component Value Date/Time   PROBNP 199.0 (H) 04/02/2022 1059     Lipid Panel     Component Value Date/Time   CHOL 203 (H) 11/30/2020 0843   TRIG 70 01/07/2022 0500   HDL 60 11/30/2020 0843   CHOLHDL 3.4 11/30/2020 0843   LDLCALC 129 (H) 11/30/2020 0843   LDLDIRECT 70 05/10/2022 1542   LABVLDL 14 11/30/2020 0843     RADIOLOGY: No results found.   Additional studies/ records that were reviewed today include:   ECHO: 09/07/2020 IMPRESSIONS   1. Left ventricular ejection fraction, by estimation, is 60 to 65%. The  left ventricle has normal function. The left ventricle has no regional  wall motion abnormalities. Left ventricular diastolic parameters are  indeterminate.   2. Right ventricular systolic function is normal. The right ventricular  size is normal. There is mildly elevated pulmonary artery systolic  pressure. The estimated right ventricular systolic pressure is 34.8 mmHg.   3. The mitral valve is normal in structure. No evidence of mitral valve  regurgitation.   4. Tricuspid valve regurgitation is mild to moderate.   5. The aortic valve is tricuspid. Aortic valve regurgitation is not  visualized. No aortic stenosis is present.   6. The inferior vena cava is normal in size with greater than 50%  respiratory variability, suggesting  right atrial pressure of 3 mmHg.    CT chest high-resolution: January 13, 2020 IMPRESSION: 1. The appearance of the lungs is compatible with progressive interstitial lung disease categorized as probable usual interstitial pneumonia (UIP) per current ATS guidelines. 2. Aortic atherosclerosis, in addition to left main and 2 vessel coronary artery disease. In addition, there is mild aneurysmal dilatation of the ascending thoracic aorta (4.5 cm in diameter). Ascending thoracic aortic aneurysm. Recommend semi-annual imaging  followup by CTA or MRA and referral to cardiothoracic surgery if not already obtained. This recommendation follows 2010 ACCF/AHA/AATS/ACR/ASA/SCA/SCAI/SIR/STS/SVM Guidelines for the Diagnosis and Management of Patients With Thoracic Aortic Disease. Circulation. 2010; 121: U981-X914. Aortic aneurysm NOS (ICD10-I71.9).   Aortic Atherosclerosis (ICD10-I70.0). Aortic aneurysm NOS (ICD10-I71.9).   Coronary CTA: 12/08/2020 FINDINGS: Image quality: Adequate.   Noise artifact is: Mild misregistration.   Coronary calcium score is 18, which places the patient in the 9th percentile for age and sex matched control. Comparison made to white males.   Coronary arteries: Normal coronary origins.  Right dominance.   Right Coronary Artery: Minimal atherosclerotic plaque in the mid RCA, <25% stenosis.   Left Main Coronary Artery: Minimal atherosclerotic plaque in the distal LMCA, <25% stenosis.   Left Anterior Descending Coronary Artery: Mild atherosclerotic plaque at the ostial LAD, 25-49% stenosis. Mild tubular atherosclerotic plaque in the proximal LAD, 25-49% stenosis, but appears to be approaching moderate stenosis.   Left Circumflex Artery: Minimal atherosclerotic plaque in the proximal L circumflex artery, <25% stenosis.   Aorta:   Measurements made in double oblique technique, sinus to sinus:   Sinus of Valsalva:   R-L: 38 mm   L-Non: 37 mm   R-Non: 35  mm   Mid ascending aorta (at PA bifurcation): 43 mm, mild dilation of ascending aorta.   Mid descending aorta (at level of pulmonary veins): 21 mm   Aortic Valve: Tricuspid aortic valve, no calcifications.   Other findings:   Normal pulmonary vein drainage into the left atrium.   Normal left atrial appendage without a thrombus.   Normal size of the pulmonary artery.   IMPRESSION: 1. Mild CAD in ostial and proximal LAD, CADRADS = 2.   2. Coronary calcium score is 18, which places the patient in the 9th percentile for age and sex matched control.   3. Normal coronary origin with right dominance.   4. Mild dilation of ascending thoracic aorta, 43 mm at mid ascending aorta.   5.  Tricuspid aortic valve    ECHO: 10/10/2021  1. Left ventricular ejection fraction, by estimation, is 65 to 70%. The  left ventricle has normal function. The left ventricle has no regional  wall motion abnormalities. Left ventricular diastolic parameters are  consistent with Grade I diastolic  dysfunction (impaired relaxation).   2. Right ventricular systolic function is normal. The right ventricular  size is normal. There is mildly elevated pulmonary artery systolic  pressure. The estimated right ventricular systolic pressure is 39.2 mmHg.   3. The mitral valve is grossly normal. Trivial mitral valve  regurgitation.   4. The aortic valve is tricuspid. Aortic valve regurgitation is not  visualized.   5. The inferior vena cava is normal in size with greater than 50%  respiratory variability, suggesting right atrial pressure of 3 mmHg.   Comparison(s): Changes from prior study are noted. 09/07/2020: LVDF 60-65%,  RVSP 34.8 mmHg.    CT CHEST: 04/24/2022 IMPRESSION: 1. The appearance of the lungs remains compatible with interstitial lung disease, considered diagnostic of usual interstitial pneumonia (UIP), per current ATS guidelines, as above, demonstrating mild progression compared to prior  studies. 2. Aortic atherosclerosis, in addition to left main and three-vessel coronary artery disease. 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 Ascending thoracic aortic aneurysm. Recommend semi-annual imaging followup by CTA or MRA and referral to cardiothoracic surgery if not already obtained. This recommendation follows 2010 ACCF/AHA/AATS/ACR/ASA/SCA/SCAI/SIR/STS/SVM Guidelines for the Diagnosis and Management  of Patients With Thoracic Aortic Disease. Circulation. 2010; 121: N829-F621. Aortic aneurysm NOS (ICD10-I71.9).   Aortic Atherosclerosis (ICD10-I70.0). Aortic aneurysm NOS (ICD10-I71.9).  ASSESSMENT:    1. Atrial tachycardia   2. Idiopathic pulmonary fibrosis (HCC)   3. Calcification of coronary artery   4. Aneurysm of ascending aorta without rupture (HCC)   5. COVID-19 virus infection   6. Hyperlipidemia LDL goal <70     PLAN:  Mr.Lanell Spainhour  is a young appearing 82 year-old gentleman who is originally from [place] and has lived in the [place] for the last 51 years.  He has had issues with chronic cough as well as weight loss.  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 Marchelle Gearing.  Remotely, he was on nintedanib but due to some progression he is on pirfenidone.  He has been documented to have mild dilation of his ascending aorta in addition to atherosclerosis involving his left main into coronary vessels.  Coronary CTA showed only mild coronary calcification with a score of 18 representing only 9% for age and sex matched control.  There was mild coronary plaque at the ostium and proximal LAD with minimal plaque elsewhere.  He has been treated with lipid-lowering therapy and laboratory in September 2022 showed an LDL at 41 he had recently been reevaluated by Dr. Marchelle Gearing.  Due to concerns on his 2D echo Doppler study that RV systolic pressure had risen to 39.2 I  performed right heart cardiac catheterization in July 2023.  This showed only very mild the increased PA systolic pressure at 30 mm.  He subsequently went to [place] and then also on a cruise and developed COVID in early September.  He required extensive hospitalization for respiratory failure requiring intubation and had a protracted hospitalization and subsequent rehab for approximately 1 month.  He has been documented to have slight progression of his ascending aortic aneurysm which measured 4.5 cm long high-resolution chest CT on April 27, 2022.  He tells me he was evaluated by Dr. Dorris Fetch and 33-month follow-up is CT imaging was recommended.  He was also noted to have aortic atherosclerosis as well as coronary calcification of the left main, LAD, and circumflex vessel.  Presently, he is feeling well.  He denies chest pain or shortness of breath.  He has had issues of tachycardia in the past and was seen by Dr. Hyacinth Meeker of EP and felt to have atrial tachycardia.  This is now well-controlled on metoprolol succinate 75 mg daily.  He continues to be on pirfenidone for his IPF.  He is on low-dose rosuvastatin 10 mg for hyperlipidemia with target LDL less than 70.  He will be undergoing repeat CT angio on June 28 for follow-up evaluation of his ascending aortic aneurysm.  He will undergo follow-up pulmonary function studies in September and see Dr. Marchelle Gearing in follow-up.  Cardiac wise presently he is stable.  I will see him in 6 months for reevaluation.  it was recommended that he undergo right heart cardiac catheterization.  I discussed the right heart catheterization with him in detail today.  I discussed the risk benefits of the procedure.  He will undergo preoperative laboratory and we will plan to perform this procedure on November 22, 2021.  Presently, his blood pressure and heart rate are stable on his current regimen.  He continues to be on pantoprazole for GERD, metoprolol succinate 25 mg daily in addition  to rosuvastatin 10 mg.   Medication Adjustments/Labs  and Tests Ordered: Current medicines are reviewed at length with the patient today.  Concerns regarding medicines are outlined above.  Medication changes, Labs and Tests ordered today are listed in the Patient Instructions below. Patient Instructions  Medication Instructions:  No changes *If you need a refill on your cardiac medications before your next appointment, please call your pharmacy*   Lab Work: none If you have labs (blood work) drawn today and your tests are completely normal, you will receive your results only by: MyChart Message (if you have MyChart) OR A paper copy in the mail If you have any lab test that is abnormal or we need to change your treatment, we will call you to review the results.   Testing/Procedures: none   Follow-Up: At [facility], you and your health needs are our priority.  As part of our continuing mission to provide you with exceptional heart care, we have created designated Provider Care Teams.  These Care Teams include your primary Cardiologist (physician) and Advanced Practice Providers (APPs -  Physician Assistants and Nurse Practitioners) who all work together to provide you with the care you need, when you need it.  We recommend signing up for the patient portal called "MyChart".  Sign up information is provided on this After Visit Summary.  MyChart is used to connect with patients for Virtual Visits (Telemedicine).  Patients are able to view lab/test results, encounter notes, upcoming appointments, etc.  Non-urgent messages can be sent to your provider as well.   To learn more about what you can do with MyChart, go to [email or web address].    Your next appointment:   6 month(s)  Provider:   Nicki Guadalajara, MD        Signed, Nicki Guadalajara, MD  10/24/2022 11:29 AM    [facility] [address], [address], [place], [place]  [address] Phone: [phone]

## 2022-10-21 NOTE — Patient Instructions (Signed)
Medication Instructions:  No changes *If you need a refill on your cardiac medications before your next appointment, please call your pharmacy*   Lab Work: none If you have labs (blood work) drawn today and your tests are completely normal, you will receive your results only by: MyChart Message (if you have MyChart) OR A paper copy in the mail If you have any lab test that is abnormal or we need to change your treatment, we will call you to review the results.   Testing/Procedures: none   Follow-Up: At [facility], you and your health needs are our priority.  As part of our continuing mission to provide you with exceptional heart care, we have created designated Provider Care Teams.  These Care Teams include your primary Cardiologist (physician) and Advanced Practice Providers (APPs -  Physician Assistants and Nurse Practitioners) who all work together to provide you with the care you need, when you need it.  We recommend signing up for the patient portal called "MyChart".  Sign up information is provided on this After Visit Summary.  MyChart is used to connect with patients for Virtual Visits (Telemedicine).  Patients are able to view lab/test results, encounter notes, upcoming appointments, etc.  Non-urgent messages can be sent to your provider as well.   To learn more about what you can do with MyChart, go to [email or web address].    Your next appointment:   6 month(s)  Provider:   Nicki Guadalajara, MD

## 2022-10-24 ENCOUNTER — Encounter: Payer: Self-pay | Admitting: Cardiovascular Disease

## 2022-10-25 ENCOUNTER — Ambulatory Visit (HOSPITAL_BASED_OUTPATIENT_CLINIC_OR_DEPARTMENT_OTHER)
Admission: RE | Admit: 2022-10-25 | Discharge: 2022-10-25 | Disposition: A | Discharge status: Home / Self Care | Payer: Medicare Other | Source: Ambulatory Visit | Attending: Family | Admitting: Family

## 2022-10-25 DIAGNOSIS — I7121 Aneurysm of the ascending aorta, without rupture: Secondary | ICD-10-CM | POA: Insufficient documentation

## 2022-10-25 LAB — POCT I-STAT CREATININE: Creatinine, Ser: 1 mg/dL (ref 0.61–1.24)

## 2022-10-25 MED ORDER — IOHEXOL 350 MG/ML SOLN
100.0000 mL | Freq: Once | INTRAVENOUS | Status: AC | PRN
  Administered 2022-10-25: 80 mL via INTRAVENOUS

## 2022-10-28 ENCOUNTER — Other Ambulatory Visit (HOSPITAL_BASED_OUTPATIENT_CLINIC_OR_DEPARTMENT_OTHER): Payer: Self-pay

## 2022-10-28 DIAGNOSIS — I7121 Aneurysm of the ascending aorta, without rupture: Secondary | ICD-10-CM

## 2022-12-25 ENCOUNTER — Other Ambulatory Visit (HOSPITAL_COMMUNITY): Payer: Self-pay | Admitting: Urology

## 2022-12-25 DIAGNOSIS — R9721 Rising PSA following treatment for malignant neoplasm of prostate: Secondary | ICD-10-CM

## 2022-12-25 DIAGNOSIS — C61 Malignant neoplasm of prostate: Secondary | ICD-10-CM

## 2022-12-27 ENCOUNTER — Encounter (HOSPITAL_COMMUNITY)
Admission: RE | Admit: 2022-12-27 | Discharge: 2022-12-27 | Disposition: A | Discharge status: Home / Self Care | Payer: Medicare Other | Source: Ambulatory Visit | Attending: Urology | Admitting: Urology

## 2022-12-27 DIAGNOSIS — C61 Malignant neoplasm of prostate: Secondary | ICD-10-CM | POA: Insufficient documentation

## 2022-12-27 DIAGNOSIS — R9721 Rising PSA following treatment for malignant neoplasm of prostate: Secondary | ICD-10-CM | POA: Insufficient documentation

## 2022-12-27 MED ORDER — FLOTUFOLASTAT F 18 GALLIUM 296-5846 MBQ/ML IV SOLN
8.0000 | Freq: Once | INTRAVENOUS | Status: AC
  Administered 2022-12-27: 8 via INTRAVENOUS

## 2023-01-14 ENCOUNTER — Ambulatory Visit: Payer: Medicare Other | Admitting: Internal Medicine

## 2023-01-14 DIAGNOSIS — J849 Interstitial pulmonary disease, unspecified: Secondary | ICD-10-CM

## 2023-01-14 LAB — PULMONARY FUNCTION TEST
DL/VA % pred: 52 %
DL/VA: 2.27 ml/min/mmHg/L
DLCO cor % pred: 20 %
DLCO cor: 5.4 ml/min/mmHg
DLCO unc % pred: 20 %
DLCO unc: 5.4 ml/min/mmHg
FEF 25-75 Pre: 2.29 L/s
FEF2575-%Pred-Pre: 133 %
FEV1-%Pred-Pre: 71 %
FEV1-Pre: 1.66 L
FEV1FVC-%Pred-Pre: 122 %
FEV6-%Pred-Pre: 57 %
FEV6-Pre: 1.78 L
FEV6FVC-%Pred-Pre: 100 %
FVC-%Pred-Pre: 57 %
FVC-Pre: 1.78 L
Pre FEV1/FVC ratio: 93 %
Pre FEV6/FVC Ratio: 100 %

## 2023-01-14 NOTE — Patient Instructions (Signed)
Spiro/DLCO performed today.

## 2023-01-14 NOTE — Progress Notes (Signed)
Spiro/DLCO performed today. 

## 2023-01-23 ENCOUNTER — Telehealth: Payer: Self-pay | Admitting: Internal Medicine

## 2023-01-23 ENCOUNTER — Encounter: Payer: Self-pay | Admitting: Internal Medicine

## 2023-01-23 ENCOUNTER — Encounter: Payer: Self-pay | Admitting: *Deleted

## 2023-01-23 ENCOUNTER — Ambulatory Visit: Payer: Medicare Other | Admitting: Internal Medicine

## 2023-01-23 VITALS — BP 112/70 | HR 75 | Ht 66.0 in | Wt 104.2 lb

## 2023-01-23 DIAGNOSIS — Z7184 Encounter for health counseling related to travel: Secondary | ICD-10-CM

## 2023-01-23 DIAGNOSIS — J84112 Idiopathic pulmonary fibrosis: Secondary | ICD-10-CM

## 2023-01-23 DIAGNOSIS — Z5181 Encounter for therapeutic drug level monitoring: Secondary | ICD-10-CM | POA: Diagnosis not present

## 2023-01-23 DIAGNOSIS — K222 Esophageal obstruction: Secondary | ICD-10-CM

## 2023-01-23 DIAGNOSIS — R634 Abnormal weight loss: Secondary | ICD-10-CM | POA: Diagnosis not present

## 2023-01-23 DIAGNOSIS — R053 Chronic cough: Secondary | ICD-10-CM

## 2023-01-23 DIAGNOSIS — R5381 Other malaise: Secondary | ICD-10-CM

## 2023-01-23 DIAGNOSIS — R590 Localized enlarged lymph nodes: Secondary | ICD-10-CM

## 2023-01-23 DIAGNOSIS — Z8546 Personal history of malignant neoplasm of prostate: Secondary | ICD-10-CM

## 2023-01-23 DIAGNOSIS — C384 Malignant neoplasm of pleura: Secondary | ICD-10-CM

## 2023-01-23 LAB — HEPATIC FUNCTION PANEL
ALT: 13 U/L (ref 0–53)
AST: 20 U/L (ref 0–37)
Albumin: 4 g/dL (ref 3.5–5.2)
Alkaline Phosphatase: 67 U/L (ref 39–117)
Bilirubin, Direct: 0 mg/dL (ref 0.0–0.3)
Total Bilirubin: 0.4 mg/dL (ref 0.2–1.2)
Total Protein: 8.4 g/dL — ABNORMAL HIGH (ref 6.0–8.3)

## 2023-01-23 NOTE — Progress Notes (Signed)
weight loss might of stabilized.  He continues his pirfenidone.  His main issues appear to be swallowing related.  He did have esophageal dilatation early March 2024 by Dr. Jeani Willis.  Subsequent to that has seen ENT has had extensive evaluation at [facility]  The son showed me a video of a barium swallow test.  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  He needs to drink water to swallow the pill.  Referral has been made to [facility]..  Also in the last week he has had increased cough which appears to be wet but there is no fever or chills wheezing.  The son thinks is because he reduced his Protonix.  He wants to increase the Protonix.  If this does not work recommended a 5-day prednisone taper.  Also he plans to go to [place] in October 2024.  I cautioned him somewhat against this because of his frailty but decided will repeat pulmonary function test in September 2023 and decide.     OV 01/23/2023  Subjective:  [name], male , DOB: April 18, 1941 , age 82 y.o. , MRN: [id number] , ADDRESS: [address] [place] [address] PCP Jeff Heys, MD Patient Care Team: Jeff Heys, MD as PCP - General (Family Medicine) Jeff Bihari, MD as PCP - Cardiology (Cardiology) Willis, Jeff Gaudy, MD as PCP -  Electrophysiology (Cardiology)  This Provider for this visit: Treatment Team:  Attending Provider: Kalman Shan, MD    01/23/2023 -   Chief Complaint  Patient presents with   Follow-up     Idiopathic pulmonary fibrosis on nintedanib 100 mg twice daily since Oct 2021   -last CT September 2022, pft oct 2022 -> progression on CT scan  -Stop nintedanib September 2022 due to weight loss and intolerance  -Started pirfenidone October 2022 -> reduced to 2 pills 3 times daily March 2023 due to weight loss  IPF flareup due to COVID-19 summer 2023 followed by aspiration pneumonia and acute respiratory failure needing oxygen and intubation  -History of intubation  - Course complicated by aspiration  -On prednisone therapy since discharge  Weight loss unintentional even before nintedanib  -Improved September/October 2022 after stopping nintedanib  -relapse while in [place] but after starting pirfenidone in the fall 2022 and early 2023.  -Progressive weight loss after summer 2023 hospitalization for COVID-19  - June 2021: 137#  - 114# - oct 2023  - 110# 06/20/2022  -105# 07/02/2022 (son surpise)d  - 104# 09/12/2022 and 01/23/2023     Cough and Dysphagia ad clearing throat  -Normal endoscopy with Dr. Loreta Willis September 2021/October 2021  -Abnormal barium study September 2022  -Status post endoscopy with esophageal stenosis dilatation by Dr. Elnoria Willis April 2023.  -Worse in October 2023 after some hospitalization for COVID  - s/p mild eso stenosis dilation 07/12/22   Normal right heart cath July 2023 without pulmonary hypertension.   Esbriet/Pirfenidone requires intensive drug monitoring due to high concerns for Adverse effects of , including  Drug Induced Liver Injury, significant GI side effects that include but not limited to Diarrhea, Nausea, Vomiting,  and other system side effects that include Fatigue, headaches, weight loss and other side effects such as skin rash. These will be monitored  with  blood work such as LFT initially once a month for 6 months and then quarterly   HPI Jeff Willis 82 y.o. -returns for follow-up.  Presents with her son Jeff Willis who is an independent historian.  Several issues  #IPF: He  stopping nintedanib Cough and Dysphagia  -Normal endoscopy with Dr. Loreta Willis September 2021/October 2021  -Abnormal barium study September 2022  HPI Jeff Willis 82 y.o. -returns for follow-up.  This is a head of his trip to [place] February 21, 2021.  He will only be back after 6 months in March /April 2023.  He tells me overall he stable.  His symptom score is stable.  Very little to no dyspnea.  He continues to have dysphagia and his chronic cough.  A year ago his endoscopy was normal.  He saw ENT because of his cough they did a barium study.  As barium study was done end of September 2022 and the suggestion of cervical web on the barium study.  He asked me to call Dr. Loreta Willis his GI.  I called her.  She said she will work him in.  He might need another endoscopy to rule out cervical web.  We discussed cervical web with the help of Google images.  Currently he is only bothered by cough especially early in the morning but this cough is stable as can be seen in the symptom score.  There is no dyspnea.  He came back from [place].  He plans to go to [place] February 21, 2021 and will stay there through March April 2023.  He is agreed for a telephone visit while in [place].  He has a pulmonologist in [place].  His walking desaturation test is stable except for mild tachycardia.  No shortness of breath.  He says he had liver function test through his primary care and this was normal.  He does not want to be checked today.    OV 09/07/2021  Subjective:  [name], male , DOB: 1940-06-24 , age 33 y.o. , MRN: [id number] , ADDRESS: [address] [place] [address] PCP Jeff Heys, MD Patient Care Team: Jeff Heys, MD as PCP - General (Family Medicine)  This Provider for this visit: Treatment Team:  Attending Provider: Kalman Shan, MD    09/07/2021 -   Chief Complaint  Patient presents with   Follow-up    PFT performed today.      HPI Jeff Willis 82 y.o. -returns for follow-up.  Last seen in October 2022.  Then in November 2023 went to [place] to [place].  He returned in March 2023.  While in [place] he says  overall he did not have any problems.  He was continuing his pirfenidone.  However he lost his weight.  He did then pause his pirfenidone for 3 weeks and his weight improved.  Then after coming here he started losing weight again.  He could not figure out if it is because of dysphagia which had also started versus pirfenidone.  He then has reduced his pirfenidone to 2 pills 3 times daily.  At this point in time is not interested in clinical trials.  He says he wants to avoid many medications.  Simultaneously has been dealing with dysphagia particular with solids and spices.  Also hard food.  He is clearing his throat quite frequently although he still rates it as level 1 out of 5.  While in [place] May 04, 2021 he had PET scan.  He showed me the images of the PET scan.  We can upload the CD-ROM in the PACS system.  In the entire left lower lobe there is slightly increased uptake for 2.7.  This is consistent with the fibrotic tissue there is also low uptake  call you with the results when they are available.         OV 10/05/2019  Subjective:  [name], male , DOB: [birth year] , age 19 y.o. , MRN: [id number] , ADDRESS: [address] [place] [address]   10/05/2019 -   Chief Complaint  Patient presents with   Follow-up    Pt states he has been doing okay since last visit and denies any complaints.   -82 year old [place] male.  Referred for interstitial lung disease.  He is accompanied by his son Jeff Willis who is a Teacher, early years/pre.  I know [name]  from a few years ago when heused to accompany his aunt; asthma patient of mine. Jeff Willis  has has been referred by Dr. Sandrea Hughs to the ILD center.  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  Up until the pandemic used to travel quite frequently.  He is to go to [place] for 3 months.  At none of the properties he is being exposed to mold.  He developed insidious onset of chronic cough over a year ago  this has been persistent.  In the last few months several cough related treatments have been tried with some improvement.  Has been on gabapentin but this made him sleepy and is currently stopped it.  Because of chronic cough which he only attributes his constant clearing of the throat he had a high-resolution CT scan of the chest that I personally visualized and shows probable UIP pattern and therefore he has been referred here.  In my personal visualization it might even be definite of UIP because there might be some early honeycombing there.  He denies any shortness of breath.  At rest he was found to be tachycardic but he says this can at baseline for him.  He will follow this up with his primary care physician.  [facility]( ILD Questionnaire  Symptoms:   He denies any shortness of breath.  No episodic shortness of breath.  However on the questioning he does report mild shortness of breath climbing up stairs.  In terms of his cough it started in June 2020.  It is the same since it started maybe in the last week it is slightly better.  Is moderate in intensity.  He does clear the throat.  Mostly dry cough occasionally white sputum.  Does feel a tickle in the back of his throat.  He is not waking up in the middle of the night.  Occasionally affects his voice.  No nausea no vomiting no diarrhea no wheezing.   Past Medical History : Denies any asthma COPD or heart failure rheumatoid arthritis or scleroderma or lupus or polymyositis or Sjogren's.  Denies sleep apnea.  Denies HIV denies pulmonary hypertension.  Denies diabetes or thyroid disease or stroke or seizures mononucleosis.  Denies any hepatitis or tuberculosis or kidney disease or pneumonia or blood clots or heart disease or pleurisy.  Does have on and off acid reflux for the last year   ROS: Positive for dry mouth for the last several months.  He also has some dysphagia for swallowing pills unclear for what duration.  He  also has acid reflux on PPI and H2 blocker.  Denies any Raynaud's or recurrent fever or weight loss.  No nausea no vomiting.   FAMILY HISTORY of LUNG DISEASE: * -Denies family history of COPD or asthma sarcoid or cystic fibrosis of hypersensitive pneumonitis or autoimmune disease.   EXPOSURE HISTORY: Denies  they are worried about it.  In addition he is worried about weight loss.  He also has poor appetite.  He is not in any pulmonary fibrosis antifibrotic's.  There is no diarrhea.  Symptom score suggest some element of anxiety since his last visit.  He has had pulmonary function test and it shows his FVC is actually better but his DLCO is actually  worse.  He had high-resolution CT chest that I visualized and reviewed with him.  It shows probable UIP with possible emerging honeycombing.  I agree with the radiologist.  The radiologist actually feels that it might be a little bit worse compared to few months ago.  Of note: He has upcoming trip to [place] between March 05, 2020 and February 2022.  He wants to make sure his care is coordinated.  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 Evaluate for pulmonary fibrosis.   EXAM: CT CHEST WITHOUT CONTRAST   TECHNIQUE: Multidetector CT imaging of the chest was performed following the standard protocol without intravenous contrast. High resolution imaging of the lungs, as well as inspiratory and expiratory imaging, was performed.   COMPARISON:  Chest CT 09/15/2019.   FINDINGS: Cardiovascular: Heart size is normal. There is no significant pericardial fluid, thickening or pericardial calcification. There is aortic atherosclerosis, as well as atherosclerosis of the great vessels of the mediastinum and the coronary arteries, including calcified atherosclerotic plaque in the left main, left anterior descending and right coronary arteries. Dilatation of the ascending thoracic aorta measuring 4.5 cm in diameter.   Mediastinum/Nodes: No pathologically enlarged mediastinal or hilar lymph nodes. Please note that accurate exclusion of hilar adenopathy is limited on noncontrast CT scans. Esophagus is unremarkable in appearance. No axillary lymphadenopathy.   Lungs/Pleura: High-resolution images demonstrate widespread areas of ground-glass attenuation, septal thickening, thickening of the peribronchovascular interstitium, mild cylindrical bronchiectasis and peripheral bronchiolectasis. There is a suggestion of, but not definitive evidence of, early honeycombing  in portions of the lungs. These findings do have a definitive craniocaudal gradient and appear slightly progressive compared to the prior study. Inspiratory and expiratory imaging is unremarkable. No acute consolidative airspace disease. No pleural effusions. No definite suspicious appearing pulmonary nodules or masses are noted.   Upper Abdomen: Cortical calcification in the upper pole of the right kidney incidentally noted.   Musculoskeletal: There are no aggressive appearing lytic or blastic lesions noted in the visualized portions of the skeleton.   IMPRESSION: 1. The appearance of the lungs is compatible with progressive interstitial lung disease categorized as probable usual interstitial pneumonia (UIP) per current ATS guidelines. 2. Aortic atherosclerosis, in addition to left main and 2 vessel coronary artery disease. In addition, there is mild aneurysmal dilatation of the ascending thoracic aorta (4.5 cm in diameter). Ascending thoracic aortic aneurysm. Recommend semi-annual imaging followup by CTA or MRA and referral to cardiothoracic surgery if not already obtained. This recommendation follows 2010 ACCF/AHA/AATS/ACR/ASA/SCA/SCAI/SIR/STS/SVM Guidelines for the Diagnosis and Management of Patients With Thoracic Aortic Disease. Circulation. 2010; 121: W098-J191. Aortic aneurysm NOS (ICD10-I71.9).   Aortic Atherosclerosis (ICD10-I70.0). Aortic aneurysm NOS (ICD10-I71.9).     Electronically Signed   By: Trudie Reed M.D.   On: 01/13/2020 15:15    ROS - per HPI    OV 03/01/2020  Subjective:  [name], male , DOB: 01-24-1941 , age 39 y.o. , MRN:  Range 09/07/2019 11:29  Sheep Sorrel IgE Latest Units: kU/L <0.10  Pecan/Hickory Tree IgE Latest Units: kU/L <0.10  IgE (Immunoglobulin E), Serum Latest Ref Range: <OR=114 kU/L 36  Allergen, D pternoyssinus,d7 Latest Units: kU/L <0.10  Cat Dander Latest Units: kU/L <0.10  Dog Dander Latest Units: kU/L <0.10  [place] Grass Latest Units: kU/L <0.10  Johnson Grass Latest Units: kU/L <0.10  Timothy Grass Latest Units: kU/L <0.10  Cockroach Latest Units: kU/L <0.10  Aspergillus fumigatus, m3 Latest Units: kU/L <0.10  Allergen, Comm Silver Charletta Cousin, t9 Latest Units: kU/L <0.10  Allergen, Cottonwood, t14 Latest Units: kU/L <0.10  Elm IgE Latest Units: kU/L <0.10  Allergen, Mulberry, t76 Latest Units: kU/L <0.10  Allergen, Oak,t7 Latest Units: kU/L <0.10  COMMON RAGWEED (SHORT) (W1) IGE Latest Units: kU/L 0.15 (H)  Allergen, Mouse Urine Protein, e78 Latest Units: kU/L <0.10  D. farinae Latest Units: kU/L <0.10  Allergen, Cedar tree, t12 Latest Units: kU/L <0.10  Box Elder IgE Latest Units: kU/L <0.10  Rough Pigweed  IgE Latest Units: kU/L <0.10    ROS - per HPI   OV 11/15/2019  Subjective:  [name], male , DOB: 01/28/1941 , age 51 y.o. , MRN: [id number] , ADDRESS: [address] [place] [address]   11/15/2019 -   Chief Complaint  Patient presents with   Follow-up    Pt states the cough has been better since last visit.      HPI Jeff Willis 82 y.o. -presents with his son Jeff Willis for follow-up.  He is here to discuss test results.  In the interim he called for worsening cough.  We gave him prednisone.  He tells me the prednisone actually made the cough worse particularly towards the end.  After that the son  doubled up his PPI and  also added H2 blockade and the cough went away completely.  Patient feels completely normal that he is possible that he has interstitial lung disease.  We reviewed the scan and I visualized it with him together and his son.  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  They wanted to discuss the implications of the scan in the setting of controlled acid reflux and controlled cough but he is largely asymptomatic at this point.  Noted his serologies were negative except for trace ANA and his overnight oxygen study was normal.     OV 01/17/2020   Subjective:  [name], male , DOB: May 09, 1940, age 6 y.o. years. , MRN: [id number],  ADDRESS: [address] [place] [address] PCP  Jeff Heys, MD Providers : Treatment Team:  Attending Provider: Kalman Shan, MD   Chief Complaint  Patient presents with   Follow-up    pt is here to go over ct and pft    Follow-up interstitial lung disease Follow-up associated cough partially controlled with acid reflux treatment Associated weight loss present   HPI Jeff Willis 82 y.o. -presents with his son Jeff Willis.  In the interim he is continue to lose weight.  He has lost another 3-4 pounds since June 2021.  His clothes are much looser.  He thinks he has been steadily losing weight.  His respiratory symptoms are not any worse although he says that his cough that seem to initially improve with doubling up of acid reflux therapy seems to persist.  At last visit in September 2020 when I thought the cough resolved but they tell me the cough only partially improved.  Nevertheless it is persisting and  Range 09/07/2019 11:29  Sheep Sorrel IgE Latest Units: kU/L <0.10  Pecan/Hickory Tree IgE Latest Units: kU/L <0.10  IgE (Immunoglobulin E), Serum Latest Ref Range: <OR=114 kU/L 36  Allergen, D pternoyssinus,d7 Latest Units: kU/L <0.10  Cat Dander Latest Units: kU/L <0.10  Dog Dander Latest Units: kU/L <0.10  [place] Grass Latest Units: kU/L <0.10  Johnson Grass Latest Units: kU/L <0.10  Timothy Grass Latest Units: kU/L <0.10  Cockroach Latest Units: kU/L <0.10  Aspergillus fumigatus, m3 Latest Units: kU/L <0.10  Allergen, Comm Silver Charletta Cousin, t9 Latest Units: kU/L <0.10  Allergen, Cottonwood, t14 Latest Units: kU/L <0.10  Elm IgE Latest Units: kU/L <0.10  Allergen, Mulberry, t76 Latest Units: kU/L <0.10  Allergen, Oak,t7 Latest Units: kU/L <0.10  COMMON RAGWEED (SHORT) (W1) IGE Latest Units: kU/L 0.15 (H)  Allergen, Mouse Urine Protein, e78 Latest Units: kU/L <0.10  D. farinae Latest Units: kU/L <0.10  Allergen, Cedar tree, t12 Latest Units: kU/L <0.10  Box Elder IgE Latest Units: kU/L <0.10  Rough Pigweed  IgE Latest Units: kU/L <0.10    ROS - per HPI   OV 11/15/2019  Subjective:  [name], male , DOB: 01/28/1941 , age 51 y.o. , MRN: [id number] , ADDRESS: [address] [place] [address]   11/15/2019 -   Chief Complaint  Patient presents with   Follow-up    Pt states the cough has been better since last visit.      HPI Jeff Willis 82 y.o. -presents with his son Jeff Willis for follow-up.  He is here to discuss test results.  In the interim he called for worsening cough.  We gave him prednisone.  He tells me the prednisone actually made the cough worse particularly towards the end.  After that the son  doubled up his PPI and  also added H2 blockade and the cough went away completely.  Patient feels completely normal that he is possible that he has interstitial lung disease.  We reviewed the scan and I visualized it with him together and his son.  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  They wanted to discuss the implications of the scan in the setting of controlled acid reflux and controlled cough but he is largely asymptomatic at this point.  Noted his serologies were negative except for trace ANA and his overnight oxygen study was normal.     OV 01/17/2020   Subjective:  [name], male , DOB: May 09, 1940, age 6 y.o. years. , MRN: [id number],  ADDRESS: [address] [place] [address] PCP  Jeff Heys, MD Providers : Treatment Team:  Attending Provider: Kalman Shan, MD   Chief Complaint  Patient presents with   Follow-up    pt is here to go over ct and pft    Follow-up interstitial lung disease Follow-up associated cough partially controlled with acid reflux treatment Associated weight loss present   HPI Jeff Willis 82 y.o. -presents with his son Jeff Willis.  In the interim he is continue to lose weight.  He has lost another 3-4 pounds since June 2021.  His clothes are much looser.  He thinks he has been steadily losing weight.  His respiratory symptoms are not any worse although he says that his cough that seem to initially improve with doubling up of acid reflux therapy seems to persist.  At last visit in September 2020 when I thought the cough resolved but they tell me the cough only partially improved.  Nevertheless it is persisting and  call you with the results when they are available.         OV 10/05/2019  Subjective:  [name], male , DOB: [birth year] , age 19 y.o. , MRN: [id number] , ADDRESS: [address] [place] [address]   10/05/2019 -   Chief Complaint  Patient presents with   Follow-up    Pt states he has been doing okay since last visit and denies any complaints.   -82 year old [place] male.  Referred for interstitial lung disease.  He is accompanied by his son Jeff Willis who is a Teacher, early years/pre.  I know [name]  from a few years ago when heused to accompany his aunt; asthma patient of mine. Jeff Willis  has has been referred by Dr. Sandrea Hughs to the ILD center.  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  Up until the pandemic used to travel quite frequently.  He is to go to [place] for 3 months.  At none of the properties he is being exposed to mold.  He developed insidious onset of chronic cough over a year ago  this has been persistent.  In the last few months several cough related treatments have been tried with some improvement.  Has been on gabapentin but this made him sleepy and is currently stopped it.  Because of chronic cough which he only attributes his constant clearing of the throat he had a high-resolution CT scan of the chest that I personally visualized and shows probable UIP pattern and therefore he has been referred here.  In my personal visualization it might even be definite of UIP because there might be some early honeycombing there.  He denies any shortness of breath.  At rest he was found to be tachycardic but he says this can at baseline for him.  He will follow this up with his primary care physician.  [facility] Comprehensive( ILD Questionnaire  Symptoms:   He denies any shortness of breath.  No episodic shortness of breath.  However on the questioning he does report mild shortness of breath climbing up stairs.  In terms of his cough it started in June 2020.  It is the same since it started maybe in the last week it is slightly better.  Is moderate in intensity.  He does clear the throat.  Mostly dry cough occasionally white sputum.  Does feel a tickle in the back of his throat.  He is not waking up in the middle of the night.  Occasionally affects his voice.  No nausea no vomiting no diarrhea no wheezing.   Past Medical History : Denies any asthma COPD or heart failure rheumatoid arthritis or scleroderma or lupus or polymyositis or Sjogren's.  Denies sleep apnea.  Denies HIV denies pulmonary hypertension.  Denies diabetes or thyroid disease or stroke or seizures mononucleosis.  Denies any hepatitis or tuberculosis or kidney disease or pneumonia or blood clots or heart disease or pleurisy.  Does have on and off acid reflux for the last year   ROS: Positive for dry mouth for the last several months.  He also has some dysphagia for swallowing pills unclear for what duration.  He  also has acid reflux on PPI and H2 blocker.  Denies any Raynaud's or recurrent fever or weight loss.  No nausea no vomiting.   FAMILY HISTORY of LUNG DISEASE: * -Denies family history of COPD or asthma sarcoid or cystic fibrosis of hypersensitive pneumonitis or autoimmune disease.   EXPOSURE HISTORY: Denies  Range 09/07/2019 11:29  Sheep Sorrel IgE Latest Units: kU/L <0.10  Pecan/Hickory Tree IgE Latest Units: kU/L <0.10  IgE (Immunoglobulin E), Serum Latest Ref Range: <OR=114 kU/L 36  Allergen, D pternoyssinus,d7 Latest Units: kU/L <0.10  Cat Dander Latest Units: kU/L <0.10  Dog Dander Latest Units: kU/L <0.10  [place] Grass Latest Units: kU/L <0.10  Johnson Grass Latest Units: kU/L <0.10  Timothy Grass Latest Units: kU/L <0.10  Cockroach Latest Units: kU/L <0.10  Aspergillus fumigatus, m3 Latest Units: kU/L <0.10  Allergen, Comm Silver Charletta Cousin, t9 Latest Units: kU/L <0.10  Allergen, Cottonwood, t14 Latest Units: kU/L <0.10  Elm IgE Latest Units: kU/L <0.10  Allergen, Mulberry, t76 Latest Units: kU/L <0.10  Allergen, Oak,t7 Latest Units: kU/L <0.10  COMMON RAGWEED (SHORT) (W1) IGE Latest Units: kU/L 0.15 (H)  Allergen, Mouse Urine Protein, e78 Latest Units: kU/L <0.10  D. farinae Latest Units: kU/L <0.10  Allergen, Cedar tree, t12 Latest Units: kU/L <0.10  Box Elder IgE Latest Units: kU/L <0.10  Rough Pigweed  IgE Latest Units: kU/L <0.10    ROS - per HPI   OV 11/15/2019  Subjective:  [name], male , DOB: 01/28/1941 , age 51 y.o. , MRN: [id number] , ADDRESS: [address] [place] [address]   11/15/2019 -   Chief Complaint  Patient presents with   Follow-up    Pt states the cough has been better since last visit.      HPI Jeff Willis 82 y.o. -presents with his son Jeff Willis for follow-up.  He is here to discuss test results.  In the interim he called for worsening cough.  We gave him prednisone.  He tells me the prednisone actually made the cough worse particularly towards the end.  After that the son  doubled up his PPI and  also added H2 blockade and the cough went away completely.  Patient feels completely normal that he is possible that he has interstitial lung disease.  We reviewed the scan and I visualized it with him together and his son.  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  They wanted to discuss the implications of the scan in the setting of controlled acid reflux and controlled cough but he is largely asymptomatic at this point.  Noted his serologies were negative except for trace ANA and his overnight oxygen study was normal.     OV 01/17/2020   Subjective:  [name], male , DOB: May 09, 1940, age 6 y.o. years. , MRN: [id number],  ADDRESS: [address] [place] [address] PCP  Jeff Heys, MD Providers : Treatment Team:  Attending Provider: Kalman Shan, MD   Chief Complaint  Patient presents with   Follow-up    pt is here to go over ct and pft    Follow-up interstitial lung disease Follow-up associated cough partially controlled with acid reflux treatment Associated weight loss present   HPI Jeff Willis 82 y.o. -presents with his son Jeff Willis.  In the interim he is continue to lose weight.  He has lost another 3-4 pounds since June 2021.  His clothes are much looser.  He thinks he has been steadily losing weight.  His respiratory symptoms are not any worse although he says that his cough that seem to initially improve with doubling up of acid reflux therapy seems to persist.  At last visit in September 2020 when I thought the cough resolved but they tell me the cough only partially improved.  Nevertheless it is persisting and  that the risk for endoscopy is acceptable given the fact he was able to walk 5-10 feet without any desaturation on room air ..  I supported him having the endoscopy.  They believe there might be some sinus drainage.  Did encourage him to try over-the-counter Flonase and also Mucinex.   He did have some back pain in the hospital and was started on gabapentin.  Is clearly not helping his cough or clearing of the throat.  He is back pain is not much of an issue.  We took a shared decision making he can come off this.      04/01/2022 Patient presents today for follow-up.  She has a history of IPF recently diagnosed with community-acquired pneumonia.  Chest x-ray on 03/20/2022 showed increased left lung opacity.  He was treated with liquid Augmentin x 7 days. Cough is worse the last several days. Son reports that he has several distince coughs. No associated shortness of breath. He has new swelling in feet last 4-5 days. Constantly clearing his throat, aggressively trys to cough up mucus but cough is mainly dry. When he lays flat he does not cough.  He is compliant with Esbriet and taking prednisone every other day. No increased oxygen demands. During last office visit with Dr. Marchelle Gearing he was referred to ENT due to cough to rule out postnasal drip as cause. He has not been contacted to schedule visit.  He had an EGD in November which showed esophageal stenosis.  Takes Protonix twice daily, needs refill.       OV 06/20/2022  Subjective:  [name], male , DOB: August 13, 1940 , age 49 y.o. , MRN: [id number] , ADDRESS: [address] [place] [address] PCP Jeff Heys, MD Patient Care Team: Jeff Heys, MD as PCP - General (Family Medicine) Jeff Bihari, MD as PCP - Cardiology (Cardiology)  This Provider for this visit: Treatment Team:   Attending Provider: Kalman Shan, MD   06/20/2022 -visit after exposure to flulike illness.   HPI Jeff Willis 82 y.o. -presents with son benign.  This is an acute visit.  He has full schedule regular visit coming up.  Some 3-4 days ago son thought he had the flu.  Initially said he had the flu but later said he did not test himself for the flu but he was flulike illness.  The daughter-in-law who also lives in the house got similar symptoms then 2 days ago patient started with body ache and cough and wet cough that is increased compared to baseline.  The sputum was clear in color and there is no fever or chills.  There is no runny nose.  Currently he is improving actually.  His cough intensity has come down his body ache is come down.  He is to do pulmonary rehabilitation and is not desaturating but he still uses his oxygen for psychological comfort.  He did tell me that when he goes upstairs he goes down to 88-89% but this is baseline.  He does not think he is desaturating currently.  We did a rapid flu test and it was negative.  He is continue to lose weight.  He had endoscopy in November 2023 for esophageal stenosis.  Frequent scheduled endoscopy at [facility] is pending by Dr. Jeani Willis.  He is continue to lose weight.      OV 07/02/2022  Subjective:  [name], male , DOB: Jan 15, 1941 , age 18 y.o. , MRN: [id number] ,  Range 09/07/2019 11:29  Sheep Sorrel IgE Latest Units: kU/L <0.10  Pecan/Hickory Tree IgE Latest Units: kU/L <0.10  IgE (Immunoglobulin E), Serum Latest Ref Range: <OR=114 kU/L 36  Allergen, D pternoyssinus,d7 Latest Units: kU/L <0.10  Cat Dander Latest Units: kU/L <0.10  Dog Dander Latest Units: kU/L <0.10  [place] Grass Latest Units: kU/L <0.10  Johnson Grass Latest Units: kU/L <0.10  Timothy Grass Latest Units: kU/L <0.10  Cockroach Latest Units: kU/L <0.10  Aspergillus fumigatus, m3 Latest Units: kU/L <0.10  Allergen, Comm Silver Charletta Cousin, t9 Latest Units: kU/L <0.10  Allergen, Cottonwood, t14 Latest Units: kU/L <0.10  Elm IgE Latest Units: kU/L <0.10  Allergen, Mulberry, t76 Latest Units: kU/L <0.10  Allergen, Oak,t7 Latest Units: kU/L <0.10  COMMON RAGWEED (SHORT) (W1) IGE Latest Units: kU/L 0.15 (H)  Allergen, Mouse Urine Protein, e78 Latest Units: kU/L <0.10  D. farinae Latest Units: kU/L <0.10  Allergen, Cedar tree, t12 Latest Units: kU/L <0.10  Box Elder IgE Latest Units: kU/L <0.10  Rough Pigweed  IgE Latest Units: kU/L <0.10    ROS - per HPI   OV 11/15/2019  Subjective:  [name], male , DOB: 01/28/1941 , age 51 y.o. , MRN: [id number] , ADDRESS: [address] [place] [address]   11/15/2019 -   Chief Complaint  Patient presents with   Follow-up    Pt states the cough has been better since last visit.      HPI Jeff Willis 82 y.o. -presents with his son Jeff Willis for follow-up.  He is here to discuss test results.  In the interim he called for worsening cough.  We gave him prednisone.  He tells me the prednisone actually made the cough worse particularly towards the end.  After that the son  doubled up his PPI and  also added H2 blockade and the cough went away completely.  Patient feels completely normal that he is possible that he has interstitial lung disease.  We reviewed the scan and I visualized it with him together and his son.  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  They wanted to discuss the implications of the scan in the setting of controlled acid reflux and controlled cough but he is largely asymptomatic at this point.  Noted his serologies were negative except for trace ANA and his overnight oxygen study was normal.     OV 01/17/2020   Subjective:  [name], male , DOB: May 09, 1940, age 6 y.o. years. , MRN: [id number],  ADDRESS: [address] [place] [address] PCP  Jeff Heys, MD Providers : Treatment Team:  Attending Provider: Kalman Shan, MD   Chief Complaint  Patient presents with   Follow-up    pt is here to go over ct and pft    Follow-up interstitial lung disease Follow-up associated cough partially controlled with acid reflux treatment Associated weight loss present   HPI Jeff Willis 82 y.o. -presents with his son Jeff Willis.  In the interim he is continue to lose weight.  He has lost another 3-4 pounds since June 2021.  His clothes are much looser.  He thinks he has been steadily losing weight.  His respiratory symptoms are not any worse although he says that his cough that seem to initially improve with doubling up of acid reflux therapy seems to persist.  At last visit in September 2020 when I thought the cough resolved but they tell me the cough only partially improved.  Nevertheless it is persisting and  weight loss might of stabilized.  He continues his pirfenidone.  His main issues appear to be swallowing related.  He did have esophageal dilatation early March 2024 by Dr. Jeani Willis.  Subsequent to that has seen ENT has had extensive evaluation at [facility]  The son showed me a video of a barium swallow test.  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  He needs to drink water to swallow the pill.  Referral has been made to [facility]..  Also in the last week he has had increased cough which appears to be wet but there is no fever or chills wheezing.  The son thinks is because he reduced his Protonix.  He wants to increase the Protonix.  If this does not work recommended a 5-day prednisone taper.  Also he plans to go to [place] in October 2024.  I cautioned him somewhat against this because of his frailty but decided will repeat pulmonary function test in September 2023 and decide.     OV 01/23/2023  Subjective:  [name], male , DOB: April 18, 1941 , age 82 y.o. , MRN: [id number] , ADDRESS: [address] [place] [address] PCP Jeff Heys, MD Patient Care Team: Jeff Heys, MD as PCP - General (Family Medicine) Jeff Bihari, MD as PCP - Cardiology (Cardiology) Willis, Jeff Gaudy, MD as PCP -  Electrophysiology (Cardiology)  This Provider for this visit: Treatment Team:  Attending Provider: Kalman Shan, MD    01/23/2023 -   Chief Complaint  Patient presents with   Follow-up     Idiopathic pulmonary fibrosis on nintedanib 100 mg twice daily since Oct 2021   -last CT September 2022, pft oct 2022 -> progression on CT scan  -Stop nintedanib September 2022 due to weight loss and intolerance  -Started pirfenidone October 2022 -> reduced to 2 pills 3 times daily March 2023 due to weight loss  IPF flareup due to COVID-19 summer 2023 followed by aspiration pneumonia and acute respiratory failure needing oxygen and intubation  -History of intubation  - Course complicated by aspiration  -On prednisone therapy since discharge  Weight loss unintentional even before nintedanib  -Improved September/October 2022 after stopping nintedanib  -relapse while in [place] but after starting pirfenidone in the fall 2022 and early 2023.  -Progressive weight loss after summer 2023 hospitalization for COVID-19  - June 2021: 137#  - 114# - oct 2023  - 110# 06/20/2022  -105# 07/02/2022 (son surpise)d  - 104# 09/12/2022 and 01/23/2023     Cough and Dysphagia ad clearing throat  -Normal endoscopy with Dr. Loreta Willis September 2021/October 2021  -Abnormal barium study September 2022  -Status post endoscopy with esophageal stenosis dilatation by Dr. Elnoria Willis April 2023.  -Worse in October 2023 after some hospitalization for COVID  - s/p mild eso stenosis dilation 07/12/22   Normal right heart cath July 2023 without pulmonary hypertension.   Esbriet/Pirfenidone requires intensive drug monitoring due to high concerns for Adverse effects of , including  Drug Induced Liver Injury, significant GI side effects that include but not limited to Diarrhea, Nausea, Vomiting,  and other system side effects that include Fatigue, headaches, weight loss and other side effects such as skin rash. These will be monitored  with  blood work such as LFT initially once a month for 6 months and then quarterly   HPI Jeff Willis 82 y.o. -returns for follow-up.  Presents with her son Jeff Willis who is an independent historian.  Several issues  #IPF: He  that the risk for endoscopy is acceptable given the fact he was able to walk 5-10 feet without any desaturation on room air ..  I supported him having the endoscopy.  They believe there might be some sinus drainage.  Did encourage him to try over-the-counter Flonase and also Mucinex.   He did have some back pain in the hospital and was started on gabapentin.  Is clearly not helping his cough or clearing of the throat.  He is back pain is not much of an issue.  We took a shared decision making he can come off this.      04/01/2022 Patient presents today for follow-up.  She has a history of IPF recently diagnosed with community-acquired pneumonia.  Chest x-ray on 03/20/2022 showed increased left lung opacity.  He was treated with liquid Augmentin x 7 days. Cough is worse the last several days. Son reports that he has several distince coughs. No associated shortness of breath. He has new swelling in feet last 4-5 days. Constantly clearing his throat, aggressively trys to cough up mucus but cough is mainly dry. When he lays flat he does not cough.  He is compliant with Esbriet and taking prednisone every other day. No increased oxygen demands. During last office visit with Dr. Marchelle Gearing he was referred to ENT due to cough to rule out postnasal drip as cause. He has not been contacted to schedule visit.  He had an EGD in November which showed esophageal stenosis.  Takes Protonix twice daily, needs refill.       OV 06/20/2022  Subjective:  [name], male , DOB: August 13, 1940 , age 49 y.o. , MRN: [id number] , ADDRESS: [address] [place] [address] PCP Jeff Heys, MD Patient Care Team: Jeff Heys, MD as PCP - General (Family Medicine) Jeff Bihari, MD as PCP - Cardiology (Cardiology)  This Provider for this visit: Treatment Team:   Attending Provider: Kalman Shan, MD   06/20/2022 -visit after exposure to flulike illness.   HPI Jeff Willis 82 y.o. -presents with son benign.  This is an acute visit.  He has full schedule regular visit coming up.  Some 3-4 days ago son thought he had the flu.  Initially said he had the flu but later said he did not test himself for the flu but he was flulike illness.  The daughter-in-law who also lives in the house got similar symptoms then 2 days ago patient started with body ache and cough and wet cough that is increased compared to baseline.  The sputum was clear in color and there is no fever or chills.  There is no runny nose.  Currently he is improving actually.  His cough intensity has come down his body ache is come down.  He is to do pulmonary rehabilitation and is not desaturating but he still uses his oxygen for psychological comfort.  He did tell me that when he goes upstairs he goes down to 88-89% but this is baseline.  He does not think he is desaturating currently.  We did a rapid flu test and it was negative.  He is continue to lose weight.  He had endoscopy in November 2023 for esophageal stenosis.  Frequent scheduled endoscopy at [facility] is pending by Dr. Jeani Willis.  He is continue to lose weight.      OV 07/02/2022  Subjective:  [name], male , DOB: Jan 15, 1941 , age 18 y.o. , MRN: [id number] ,  stopping nintedanib Cough and Dysphagia  -Normal endoscopy with Dr. Loreta Willis September 2021/October 2021  -Abnormal barium study September 2022  HPI Jeff Willis 82 y.o. -returns for follow-up.  This is a head of his trip to [place] February 21, 2021.  He will only be back after 6 months in March /April 2023.  He tells me overall he stable.  His symptom score is stable.  Very little to no dyspnea.  He continues to have dysphagia and his chronic cough.  A year ago his endoscopy was normal.  He saw ENT because of his cough they did a barium study.  As barium study was done end of September 2022 and the suggestion of cervical web on the barium study.  He asked me to call Dr. Loreta Willis his GI.  I called her.  She said she will work him in.  He might need another endoscopy to rule out cervical web.  We discussed cervical web with the help of Google images.  Currently he is only bothered by cough especially early in the morning but this cough is stable as can be seen in the symptom score.  There is no dyspnea.  He came back from [place].  He plans to go to [place] February 21, 2021 and will stay there through March April 2023.  He is agreed for a telephone visit while in [place].  He has a pulmonologist in [place].  His walking desaturation test is stable except for mild tachycardia.  No shortness of breath.  He says he had liver function test through his primary care and this was normal.  He does not want to be checked today.    OV 09/07/2021  Subjective:  [name], male , DOB: 1940-06-24 , age 33 y.o. , MRN: [id number] , ADDRESS: [address] [place] [address] PCP Jeff Heys, MD Patient Care Team: Jeff Heys, MD as PCP - General (Family Medicine)  This Provider for this visit: Treatment Team:  Attending Provider: Kalman Shan, MD    09/07/2021 -   Chief Complaint  Patient presents with   Follow-up    PFT performed today.      HPI Jeff Willis 82 y.o. -returns for follow-up.  Last seen in October 2022.  Then in November 2023 went to [place] to [place].  He returned in March 2023.  While in [place] he says  overall he did not have any problems.  He was continuing his pirfenidone.  However he lost his weight.  He did then pause his pirfenidone for 3 weeks and his weight improved.  Then after coming here he started losing weight again.  He could not figure out if it is because of dysphagia which had also started versus pirfenidone.  He then has reduced his pirfenidone to 2 pills 3 times daily.  At this point in time is not interested in clinical trials.  He says he wants to avoid many medications.  Simultaneously has been dealing with dysphagia particular with solids and spices.  Also hard food.  He is clearing his throat quite frequently although he still rates it as level 1 out of 5.  While in [place] May 04, 2021 he had PET scan.  He showed me the images of the PET scan.  We can upload the CD-ROM in the PACS system.  In the entire left lower lobe there is slightly increased uptake for 2.7.  This is consistent with the fibrotic tissue there is also low uptake  they are worried about it.  In addition he is worried about weight loss.  He also has poor appetite.  He is not in any pulmonary fibrosis antifibrotic's.  There is no diarrhea.  Symptom score suggest some element of anxiety since his last visit.  He has had pulmonary function test and it shows his FVC is actually better but his DLCO is actually  worse.  He had high-resolution CT chest that I visualized and reviewed with him.  It shows probable UIP with possible emerging honeycombing.  I agree with the radiologist.  The radiologist actually feels that it might be a little bit worse compared to few months ago.  Of note: He has upcoming trip to [place] between March 05, 2020 and February 2022.  He wants to make sure his care is coordinated.  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 Evaluate for pulmonary fibrosis.   EXAM: CT CHEST WITHOUT CONTRAST   TECHNIQUE: Multidetector CT imaging of the chest was performed following the standard protocol without intravenous contrast. High resolution imaging of the lungs, as well as inspiratory and expiratory imaging, was performed.   COMPARISON:  Chest CT 09/15/2019.   FINDINGS: Cardiovascular: Heart size is normal. There is no significant pericardial fluid, thickening or pericardial calcification. There is aortic atherosclerosis, as well as atherosclerosis of the great vessels of the mediastinum and the coronary arteries, including calcified atherosclerotic plaque in the left main, left anterior descending and right coronary arteries. Dilatation of the ascending thoracic aorta measuring 4.5 cm in diameter.   Mediastinum/Nodes: No pathologically enlarged mediastinal or hilar lymph nodes. Please note that accurate exclusion of hilar adenopathy is limited on noncontrast CT scans. Esophagus is unremarkable in appearance. No axillary lymphadenopathy.   Lungs/Pleura: High-resolution images demonstrate widespread areas of ground-glass attenuation, septal thickening, thickening of the peribronchovascular interstitium, mild cylindrical bronchiectasis and peripheral bronchiolectasis. There is a suggestion of, but not definitive evidence of, early honeycombing  in portions of the lungs. These findings do have a definitive craniocaudal gradient and appear slightly progressive compared to the prior study. Inspiratory and expiratory imaging is unremarkable. No acute consolidative airspace disease. No pleural effusions. No definite suspicious appearing pulmonary nodules or masses are noted.   Upper Abdomen: Cortical calcification in the upper pole of the right kidney incidentally noted.   Musculoskeletal: There are no aggressive appearing lytic or blastic lesions noted in the visualized portions of the skeleton.   IMPRESSION: 1. The appearance of the lungs is compatible with progressive interstitial lung disease categorized as probable usual interstitial pneumonia (UIP) per current ATS guidelines. 2. Aortic atherosclerosis, in addition to left main and 2 vessel coronary artery disease. In addition, there is mild aneurysmal dilatation of the ascending thoracic aorta (4.5 cm in diameter). Ascending thoracic aortic aneurysm. Recommend semi-annual imaging followup by CTA or MRA and referral to cardiothoracic surgery if not already obtained. This recommendation follows 2010 ACCF/AHA/AATS/ACR/ASA/SCA/SCAI/SIR/STS/SVM Guidelines for the Diagnosis and Management of Patients With Thoracic Aortic Disease. Circulation. 2010; 121: W098-J191. Aortic aneurysm NOS (ICD10-I71.9).   Aortic Atherosclerosis (ICD10-I70.0). Aortic aneurysm NOS (ICD10-I71.9).     Electronically Signed   By: Trudie Reed M.D.   On: 01/13/2020 15:15    ROS - per HPI    OV 03/01/2020  Subjective:  [name], male , DOB: 01-24-1941 , age 39 y.o. , MRN:  He has diarrhea with the nintedanib.  This is on low-dose nintedanib.  Currently is on the [place] version of the nintedanib but he still losing weight and having the same GI symptoms.  In terms of his tachycardia he had echocardiogram and this is normal.  He says his wife is very concerned about his weight loss.  His pants are loose now.      ECHO MAY 2022   IMPRESSIONS     1. Left ventricular ejection fraction, by estimation, is 60 to 65%. The  left ventricle has normal function. The left ventricle has no regional  wall motion abnormalities. Left ventricular diastolic parameters are  indeterminate.   2. Right ventricular systolic function is normal. The right ventricular  size is normal. There is mildly elevated pulmonary artery systolic  pressure. The estimated right ventricular systolic pressure is 34.8 mmHg.   3. The mitral valve is normal in structure. No evidence of mitral valve  regurgitation.   4. Tricuspid valve regurgitation is mild to moderate.   5. The aortic valve is tricuspid. Aortic valve regurgitation is not  visualized. No aortic stenosis is present.   6. The inferior vena cava is normal in size with greater than 50%  respiratory variability, suggesting right atrial pressure of 3 mmHg.     OV 01/11/2021  Subjective:  [name], male , DOB: Jun 26, 1940 , age 5 y.o. , MRN: [id number] , ADDRESS: [address] [place] [address] PCP Jeff Heys, MD Patient Care Team: Jeff Heys, MD as PCP - General (Family Medicine)  This Provider for this visit: Treatment Team:  Attending Provider: Kalman Shan, MD Type of visit: Telephone/Video Circumstance: COVID-19 national emergency Identification of patient Jeff COURIER  with 1940/10/06 and MRN [id number] - 2 person identifier Risks: Risks, benefits, limitations of telephone visit explained. Patient understood and verbalized agreement to proceed Anyone else on call: no one other than patient and MD Patient location: patient home This provider location: [address]; [place], [place], [address]   01/11/2021 -  IPF followup   Idiopathic pulmonary fibrosis on nintedanib 100 mg twice daily since Oct 2021   -last CT September 2021 Weight loss unintentional even before nintedanib Cough Tachycardia NOS  HPI Jeff Willis 82 y.o. - off ofev since may 2022 Gained weight 5#/ Minimal dyspnea to no dyspnea even with exertion. Wento [place] in interim and had good time. Did water slide. Cough is a bit worse. Othewrise well. Enegery level goodl Though cough is mild - wants something for cough relief thought says medicine from [place] helping.  Plans to go to [place] on February 21, 2021    OV 02/06/2021  Subjective:  [name], male , DOB: November 08, 1940 , age 5 y.o. , MRN: [id number] , ADDRESS: [address] [place] [address] PCP Jeff Heys, MD Patient Care Team: Jeff Heys, MD as PCP - General (Family Medicine)  This Provider for this visit: Treatment Team:  Attending Provider: Kalman Shan, MD    02/06/2021 -   Chief Complaint  Patient presents with   Follow-up    Esbriet follow up, cough in the morning    Idiopathic pulmonary fibrosis on nintedanib 100 mg twice daily since Oct 2021   -last CT September 2022, pft oct 2022 -> progression on CT scan  -Stop nintedanib September 2022 due to weight loss and intolerance  -Started pirfenidone October 2022 Weight loss unintentional even before nintedanib  -Improved September/October 2022 after  Range 09/07/2019 11:29  Sheep Sorrel IgE Latest Units: kU/L <0.10  Pecan/Hickory Tree IgE Latest Units: kU/L <0.10  IgE (Immunoglobulin E), Serum Latest Ref Range: <OR=114 kU/L 36  Allergen, D pternoyssinus,d7 Latest Units: kU/L <0.10  Cat Dander Latest Units: kU/L <0.10  Dog Dander Latest Units: kU/L <0.10  [place] Grass Latest Units: kU/L <0.10  Johnson Grass Latest Units: kU/L <0.10  Timothy Grass Latest Units: kU/L <0.10  Cockroach Latest Units: kU/L <0.10  Aspergillus fumigatus, m3 Latest Units: kU/L <0.10  Allergen, Comm Silver Charletta Cousin, t9 Latest Units: kU/L <0.10  Allergen, Cottonwood, t14 Latest Units: kU/L <0.10  Elm IgE Latest Units: kU/L <0.10  Allergen, Mulberry, t76 Latest Units: kU/L <0.10  Allergen, Oak,t7 Latest Units: kU/L <0.10  COMMON RAGWEED (SHORT) (W1) IGE Latest Units: kU/L 0.15 (H)  Allergen, Mouse Urine Protein, e78 Latest Units: kU/L <0.10  D. farinae Latest Units: kU/L <0.10  Allergen, Cedar tree, t12 Latest Units: kU/L <0.10  Box Elder IgE Latest Units: kU/L <0.10  Rough Pigweed  IgE Latest Units: kU/L <0.10    ROS - per HPI   OV 11/15/2019  Subjective:  [name], male , DOB: 01/28/1941 , age 51 y.o. , MRN: [id number] , ADDRESS: [address] [place] [address]   11/15/2019 -   Chief Complaint  Patient presents with   Follow-up    Pt states the cough has been better since last visit.      HPI Jeff Willis 82 y.o. -presents with his son Jeff Willis for follow-up.  He is here to discuss test results.  In the interim he called for worsening cough.  We gave him prednisone.  He tells me the prednisone actually made the cough worse particularly towards the end.  After that the son  doubled up his PPI and  also added H2 blockade and the cough went away completely.  Patient feels completely normal that he is possible that he has interstitial lung disease.  We reviewed the scan and I visualized it with him together and his son.  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  They wanted to discuss the implications of the scan in the setting of controlled acid reflux and controlled cough but he is largely asymptomatic at this point.  Noted his serologies were negative except for trace ANA and his overnight oxygen study was normal.     OV 01/17/2020   Subjective:  [name], male , DOB: May 09, 1940, age 6 y.o. years. , MRN: [id number],  ADDRESS: [address] [place] [address] PCP  Jeff Heys, MD Providers : Treatment Team:  Attending Provider: Kalman Shan, MD   Chief Complaint  Patient presents with   Follow-up    pt is here to go over ct and pft    Follow-up interstitial lung disease Follow-up associated cough partially controlled with acid reflux treatment Associated weight loss present   HPI Jeff Willis 82 y.o. -presents with his son Jeff Willis.  In the interim he is continue to lose weight.  He has lost another 3-4 pounds since June 2021.  His clothes are much looser.  He thinks he has been steadily losing weight.  His respiratory symptoms are not any worse although he says that his cough that seem to initially improve with doubling up of acid reflux therapy seems to persist.  At last visit in September 2020 when I thought the cough resolved but they tell me the cough only partially improved.  Nevertheless it is persisting and  He has diarrhea with the nintedanib.  This is on low-dose nintedanib.  Currently is on the [place] version of the nintedanib but he still losing weight and having the same GI symptoms.  In terms of his tachycardia he had echocardiogram and this is normal.  He says his wife is very concerned about his weight loss.  His pants are loose now.      ECHO MAY 2022   IMPRESSIONS     1. Left ventricular ejection fraction, by estimation, is 60 to 65%. The  left ventricle has normal function. The left ventricle has no regional  wall motion abnormalities. Left ventricular diastolic parameters are  indeterminate.   2. Right ventricular systolic function is normal. The right ventricular  size is normal. There is mildly elevated pulmonary artery systolic  pressure. The estimated right ventricular systolic pressure is 34.8 mmHg.   3. The mitral valve is normal in structure. No evidence of mitral valve  regurgitation.   4. Tricuspid valve regurgitation is mild to moderate.   5. The aortic valve is tricuspid. Aortic valve regurgitation is not  visualized. No aortic stenosis is present.   6. The inferior vena cava is normal in size with greater than 50%  respiratory variability, suggesting right atrial pressure of 3 mmHg.     OV 01/11/2021  Subjective:  [name], male , DOB: Jun 26, 1940 , age 5 y.o. , MRN: [id number] , ADDRESS: [address] [place] [address] PCP Jeff Heys, MD Patient Care Team: Jeff Heys, MD as PCP - General (Family Medicine)  This Provider for this visit: Treatment Team:  Attending Provider: Kalman Shan, MD Type of visit: Telephone/Video Circumstance: COVID-19 national emergency Identification of patient Jeff COURIER  with 1940/10/06 and MRN [id number] - 2 person identifier Risks: Risks, benefits, limitations of telephone visit explained. Patient understood and verbalized agreement to proceed Anyone else on call: no one other than patient and MD Patient location: patient home This provider location: [address]; [place], [place], [address]   01/11/2021 -  IPF followup   Idiopathic pulmonary fibrosis on nintedanib 100 mg twice daily since Oct 2021   -last CT September 2021 Weight loss unintentional even before nintedanib Cough Tachycardia NOS  HPI Jeff Willis 82 y.o. - off ofev since may 2022 Gained weight 5#/ Minimal dyspnea to no dyspnea even with exertion. Wento [place] in interim and had good time. Did water slide. Cough is a bit worse. Othewrise well. Enegery level goodl Though cough is mild - wants something for cough relief thought says medicine from [place] helping.  Plans to go to [place] on February 21, 2021    OV 02/06/2021  Subjective:  [name], male , DOB: November 08, 1940 , age 5 y.o. , MRN: [id number] , ADDRESS: [address] [place] [address] PCP Jeff Heys, MD Patient Care Team: Jeff Heys, MD as PCP - General (Family Medicine)  This Provider for this visit: Treatment Team:  Attending Provider: Kalman Shan, MD    02/06/2021 -   Chief Complaint  Patient presents with   Follow-up    Esbriet follow up, cough in the morning    Idiopathic pulmonary fibrosis on nintedanib 100 mg twice daily since Oct 2021   -last CT September 2022, pft oct 2022 -> progression on CT scan  -Stop nintedanib September 2022 due to weight loss and intolerance  -Started pirfenidone October 2022 Weight loss unintentional even before nintedanib  -Improved September/October 2022 after  [id number] , ADDRESS: [address] [place] [address] PCP Jeff Heys, MD Patient Care Team: Jeff Heys, MD as PCP - General (Family Medicine)  This Provider for this visit: Treatment Team:  Attending Provider: Kalman Shan, MD  FU IPF Associated weight loss +  pre-ofev   03/01/2020 -   Chief Complaint  Patient presents with   Follow-up    ILD, doing ok, cough has not improved     HPI Jeff Willis 82 y.o. -returns for follow-up.  This is after starting nintedanib.  He is currently on 100 mg twice daily for 2 weeks.  His prescription is approved on February 02, 2020.  He accidentally got 150 mg twice daily but he sent this back.  He is going to send [place] back.  He has upcoming travel to [place] later this week he will be gone there for till February 2022.  He has supply and refill of his nintedanib on 06 March 2020.  After that he does not have nintedanib.  He and his son report that somebody will be able to go to [place] and deliver it to him.  They do not want to trust the courier service to take it there the supply chain issues.  At this point time from a respiratory standpoint he has minimal dyspnea.  He still has a cough.  The cough is not worse but after reducing the PPI dose the cough has come back.  It is somewhat troublesome.  The severity score is listed below.  They report the GI Dr. Loreta Willis has reported concern about taking very high-dose of PPI and H2 blockade which actually helped the cough.  Is because of osteoporosis risk.  I agreed with that concern.  However patient is also reporting significant symptom burden.  Primary care has suggested that they follow the symptom control and quality of life issues.  I did support the primary care viewpoint as well.  Did explain to them at the end of the day as a risk-benefit ratio and with an advanced disease like pulmonary fibrosis 1 might have to consider taking a short-term again with cough control at the expense of long-term risk of side effects.  They are going to think about this.  In terms of his travel to [place].  He put me in touch with Dr. Allena Katz.  I texted him and establish contact.  He will follow with Dr. Allena Katz who is a pulmonologist trained in [place] but now living in [place].  Gave other travel  advice are listed below.  In terms of weight loss: This is ongoing.  Even after starting the Ofev is not having any GI side effects but his weight loss continues.  He had a CT abdomen that is not showing any reason for weight loss.  Apparently had some hematuria had cystoscopy with Dr. Mena Goes.  He has some radiation seed implants.  There is no bladder cancer.  The really frustrated by this.  I did pull out an article about IPF and weight loss for the son to read.- shows IPF weight loss is asssocited with worse progrnosis    ROS - per HPI    OV 07/24/2020  Subjective:  [name], male , DOB: 11-Feb-1941 , age 15 y.o. , MRN: [id number] , ADDRESS: [address] [place] [address] PCP Jeff Heys, MD Patient Care Team: Jeff Heys, MD as PCP - General (Family Medicine)  This Provider for this visit: Treatment Team:  Attending Provider: Kalman Willis,  ADDRESS: [address] [place] [address] PCP Jeff Heys, MD Patient Care Team: Jeff Heys, MD as PCP - General (Family Medicine) Jeff Bihari, MD as PCP - Cardiology (Cardiology)  This Provider for this visit: Treatment Team:  Attending Provider: Kalman Shan, MD   07/02/2022 -   Chief Complaint  Patient presents with   Follow-up    PFT     HPI Kashis G Corpuz 82 y.o. -returns for follow-up.  I recently saw him a few weeks ago because of exposure to flulike illness.  He states he is  back to baseline.  However does appear to have lost 5 more pounds of weight in the last 2 weeks.  His son is surprised by this.  Patient does admit to low appetite.  However he is known to deal with esophageal stenosis.  The son did indicate to me at last visit and again this visit that Dr. Jeani Willis is considering sequential endoscopy with sequential dilatation at specific interval.  Apparently this has been hard to schedule with the endoscopy suite at [facility] long.  At the request I did reach to Roselie Awkward the director for the endoscopy suite to work with Dr. Jeani Willis to make sure this was possible because of his ongoing weight loss.  Son is also not sure whether it is accurate weight loss of 5 pounds in the last 2 weeks.  Therefore I asked them to track dry weight on a scheduled basis at a specific fix time.  We did discuss about the possibility of pirfenidone playing a role in the weight loss.  They are also having a swallowing evaluation coming up at [facility]   He did have lung function test today and overall there is a 35% decline in [facility] compared to May 2023 [his COVID was in July August 2023].  There is at least 14% progression since fall 2023.  They are surprised by the decline in PFT because they said that he is actually doing better his cough is better and his symptom scores are better.  He does have oxygen but he states that rest he does not need it.  Also nighttime he is not using it consistently and does not feel the need to.  They say when they checked the pulse ox when he is exerting or sitting still it is stable.  Overall son and he were in alignment that ever since COVID in summer 2023 his overall health status is declined. CT Chest data OV 09/12/2022  Subjective:  Patient ID: BRENNDEN THAKORE, male , DOB: May 18, 1940 , age 37 y.o. , MRN: [id number] , ADDRESS: [address] [place] [address] PCP Jeff Heys, MD Patient Care Team: Jeff Heys, MD as PCP -  General (Family Medicine) Jeff Bihari, MD as PCP - Cardiology (Cardiology) Willis, Jeff Gaudy, MD as PCP - Electrophysiology (Cardiology)  This Provider for this visit: Treatment Team:  Attending Provider: Kalman Shan, MD    09/12/2022 -   Chief Complaint  Patient presents with   Follow-up    F/up wet cough, thin and white mucous.     HPI Arran G Tonne 82 y.o. -returns for follow-up.  Presents with her son Jeff Willis.  He is not using oxygen anymore.  In fact he did a sit/stand test and he did not need oxygen.  Although he is sitting here with oxygen.  He feels stable he is driving car.  However he continues to lose weight although the pace of the  By: Delbert Phenix M.D.   On: 09/15/2019 14:26     Results for JAION, RAVELO (MRN [id number]) as of 10/05/2019 10:40  Ref. Range 09/07/2019 11:29  Sheep Sorrel IgE Latest Units: kU/L  <0.10  Pecan/Hickory Tree IgE Latest Units: kU/L <0.10  IgE (Immunoglobulin E), Serum Latest Ref Range: <OR=114 kU/L 36  Allergen, D pternoyssinus,d7 Latest Units: kU/L <0.10  Cat Dander Latest Units: kU/L <0.10  Dog Dander Latest Units: kU/L <0.10  [place] Grass Latest Units: kU/L <0.10  Johnson Grass Latest Units: kU/L <0.10  Timothy Grass Latest Units: kU/L <0.10  Cockroach Latest Units: kU/L <0.10  Aspergillus fumigatus, m3 Latest Units: kU/L <0.10  Allergen, Comm Silver Charletta Cousin, t9 Latest Units: kU/L <0.10  Allergen, Cottonwood, t14 Latest Units: kU/L <0.10  Elm IgE Latest Units: kU/L <0.10  Allergen, Mulberry, t76 Latest Units: kU/L <0.10  Allergen, Oak,t7 Latest Units: kU/L <0.10  COMMON RAGWEED (SHORT) (W1) IGE Latest Units: kU/L 0.15 (H)  Allergen, Mouse Urine Protein, e78 Latest Units: kU/L <0.10  D. farinae Latest Units: kU/L <0.10  Allergen, Cedar tree, t12 Latest Units: kU/L <0.10  Box Elder IgE Latest Units: kU/L <0.10  Rough Pigweed  IgE Latest Units: kU/L <0.10    ROS - per HPI   OV 11/15/2019  Subjective:  [name], male , DOB: 1941/04/10 , age 67 y.o. , MRN: [id number] , ADDRESS: [address] [place] [address]   11/15/2019 -   Chief Complaint  Patient presents with   Follow-up    Pt states the cough has been better since last visit.      HPI Jeff Willis 82 y.o. -presents with his son Jeff Willis for follow-up.  He is here to discuss test results.  In the interim he called for worsening cough.  We gave him prednisone.  He tells me the prednisone actually made the cough worse particularly towards the end.  After that the son  doubled up his PPI and also added H2 blockade and the cough went away completely.  Patient feels completely normal that he is possible that he has interstitial lung disease.  We reviewed the scan and I visualized it with him together and his son.  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  They wanted to discuss the implications  of the scan in the setting of controlled acid reflux and controlled cough but he is largely asymptomatic at this point.  Noted his serologies were negative except for trace ANA and his overnight oxygen study was normal.     OV 01/17/2020   Subjective:  [name], male , DOB: 09-25-1940, age 37 y.o. years. , MRN: [id number],  ADDRESS: [address] [place] [address] PCP  Jeff Heys, MD Providers : Treatment Team:  Attending Provider: Kalman Shan, MD   Chief Complaint  Patient presents with   Follow-up    pt is here to go over ct and pft    Follow-up interstitial lung disease Follow-up associated cough partially controlled with acid reflux treatment Associated weight loss present   HPI Mykal G Buffin 82 y.o. -presents with his son Jeff Willis.  In the interim he is continue to lose weight.  He has lost another 3-4 pounds since June 2021.  His clothes are much looser.  He thinks he has been steadily losing weight.  His respiratory symptoms are not any worse although he says that his cough that seem to initially improve with doubling up of acid reflux therapy seems to persist.  that the risk for endoscopy is acceptable given the fact he was able to walk 5-10 feet without any desaturation on room air ..  I supported him having the endoscopy.  They believe there might be some sinus drainage.  Did encourage him to try over-the-counter Flonase and also Mucinex.   He did have some back pain in the hospital and was started on gabapentin.  Is clearly not helping his cough or clearing of the throat.  He is back pain is not much of an issue.  We took a shared decision making he can come off this.      04/01/2022 Patient presents today for follow-up.  She has a history of IPF recently diagnosed with community-acquired pneumonia.  Chest x-ray on 03/20/2022 showed increased left lung opacity.  He was treated with liquid Augmentin x 7 days. Cough is worse the last several days. Son reports that he has several distince coughs. No associated shortness of breath. He has new swelling in feet last 4-5 days. Constantly clearing his throat, aggressively trys to cough up mucus but cough is mainly dry. When he lays flat he does not cough.  He is compliant with Esbriet and taking prednisone every other day. No increased oxygen demands. During last office visit with Dr. Marchelle Gearing he was referred to ENT due to cough to rule out postnasal drip as cause. He has not been contacted to schedule visit.  He had an EGD in November which showed esophageal stenosis.  Takes Protonix twice daily, needs refill.       OV 06/20/2022  Subjective:  [name], male , DOB: August 13, 1940 , age 49 y.o. , MRN: [id number] , ADDRESS: [address] [place] [address] PCP Jeff Heys, MD Patient Care Team: Jeff Heys, MD as PCP - General (Family Medicine) Jeff Bihari, MD as PCP - Cardiology (Cardiology)  This Provider for this visit: Treatment Team:   Attending Provider: Kalman Shan, MD   06/20/2022 -visit after exposure to flulike illness.   HPI Jeff Willis 82 y.o. -presents with son benign.  This is an acute visit.  He has full schedule regular visit coming up.  Some 3-4 days ago son thought he had the flu.  Initially said he had the flu but later said he did not test himself for the flu but he was flulike illness.  The daughter-in-law who also lives in the house got similar symptoms then 2 days ago patient started with body ache and cough and wet cough that is increased compared to baseline.  The sputum was clear in color and there is no fever or chills.  There is no runny nose.  Currently he is improving actually.  His cough intensity has come down his body ache is come down.  He is to do pulmonary rehabilitation and is not desaturating but he still uses his oxygen for psychological comfort.  He did tell me that when he goes upstairs he goes down to 88-89% but this is baseline.  He does not think he is desaturating currently.  We did a rapid flu test and it was negative.  He is continue to lose weight.  He had endoscopy in November 2023 for esophageal stenosis.  Frequent scheduled endoscopy at [facility] is pending by Dr. Jeani Willis.  He is continue to lose weight.      OV 07/02/2022  Subjective:  [name], male , DOB: Jan 15, 1941 , age 18 y.o. , MRN: [id number] ,  Range 09/07/2019 11:29  Sheep Sorrel IgE Latest Units: kU/L <0.10  Pecan/Hickory Tree IgE Latest Units: kU/L <0.10  IgE (Immunoglobulin E), Serum Latest Ref Range: <OR=114 kU/L 36  Allergen, D pternoyssinus,d7 Latest Units: kU/L <0.10  Cat Dander Latest Units: kU/L <0.10  Dog Dander Latest Units: kU/L <0.10  [place] Grass Latest Units: kU/L <0.10  Johnson Grass Latest Units: kU/L <0.10  Timothy Grass Latest Units: kU/L <0.10  Cockroach Latest Units: kU/L <0.10  Aspergillus fumigatus, m3 Latest Units: kU/L <0.10  Allergen, Comm Silver Charletta Cousin, t9 Latest Units: kU/L <0.10  Allergen, Cottonwood, t14 Latest Units: kU/L <0.10  Elm IgE Latest Units: kU/L <0.10  Allergen, Mulberry, t76 Latest Units: kU/L <0.10  Allergen, Oak,t7 Latest Units: kU/L <0.10  COMMON RAGWEED (SHORT) (W1) IGE Latest Units: kU/L 0.15 (H)  Allergen, Mouse Urine Protein, e78 Latest Units: kU/L <0.10  D. farinae Latest Units: kU/L <0.10  Allergen, Cedar tree, t12 Latest Units: kU/L <0.10  Box Elder IgE Latest Units: kU/L <0.10  Rough Pigweed  IgE Latest Units: kU/L <0.10    ROS - per HPI   OV 11/15/2019  Subjective:  [name], male , DOB: 01/28/1941 , age 51 y.o. , MRN: [id number] , ADDRESS: [address] [place] [address]   11/15/2019 -   Chief Complaint  Patient presents with   Follow-up    Pt states the cough has been better since last visit.      HPI Jeff Willis 82 y.o. -presents with his son Jeff Willis for follow-up.  He is here to discuss test results.  In the interim he called for worsening cough.  We gave him prednisone.  He tells me the prednisone actually made the cough worse particularly towards the end.  After that the son  doubled up his PPI and  also added H2 blockade and the cough went away completely.  Patient feels completely normal that he is possible that he has interstitial lung disease.  We reviewed the scan and I visualized it with him together and his son.  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  They wanted to discuss the implications of the scan in the setting of controlled acid reflux and controlled cough but he is largely asymptomatic at this point.  Noted his serologies were negative except for trace ANA and his overnight oxygen study was normal.     OV 01/17/2020   Subjective:  [name], male , DOB: May 09, 1940, age 6 y.o. years. , MRN: [id number],  ADDRESS: [address] [place] [address] PCP  Jeff Heys, MD Providers : Treatment Team:  Attending Provider: Kalman Shan, MD   Chief Complaint  Patient presents with   Follow-up    pt is here to go over ct and pft    Follow-up interstitial lung disease Follow-up associated cough partially controlled with acid reflux treatment Associated weight loss present   HPI Jeff Willis 82 y.o. -presents with his son Jeff Willis.  In the interim he is continue to lose weight.  He has lost another 3-4 pounds since June 2021.  His clothes are much looser.  He thinks he has been steadily losing weight.  His respiratory symptoms are not any worse although he says that his cough that seem to initially improve with doubling up of acid reflux therapy seems to persist.  At last visit in September 2020 when I thought the cough resolved but they tell me the cough only partially improved.  Nevertheless it is persisting and  Range 09/07/2019 11:29  Sheep Sorrel IgE Latest Units: kU/L <0.10  Pecan/Hickory Tree IgE Latest Units: kU/L <0.10  IgE (Immunoglobulin E), Serum Latest Ref Range: <OR=114 kU/L 36  Allergen, D pternoyssinus,d7 Latest Units: kU/L <0.10  Cat Dander Latest Units: kU/L <0.10  Dog Dander Latest Units: kU/L <0.10  [place] Grass Latest Units: kU/L <0.10  Johnson Grass Latest Units: kU/L <0.10  Timothy Grass Latest Units: kU/L <0.10  Cockroach Latest Units: kU/L <0.10  Aspergillus fumigatus, m3 Latest Units: kU/L <0.10  Allergen, Comm Silver Charletta Cousin, t9 Latest Units: kU/L <0.10  Allergen, Cottonwood, t14 Latest Units: kU/L <0.10  Elm IgE Latest Units: kU/L <0.10  Allergen, Mulberry, t76 Latest Units: kU/L <0.10  Allergen, Oak,t7 Latest Units: kU/L <0.10  COMMON RAGWEED (SHORT) (W1) IGE Latest Units: kU/L 0.15 (H)  Allergen, Mouse Urine Protein, e78 Latest Units: kU/L <0.10  D. farinae Latest Units: kU/L <0.10  Allergen, Cedar tree, t12 Latest Units: kU/L <0.10  Box Elder IgE Latest Units: kU/L <0.10  Rough Pigweed  IgE Latest Units: kU/L <0.10    ROS - per HPI   OV 11/15/2019  Subjective:  [name], male , DOB: 01/28/1941 , age 51 y.o. , MRN: [id number] , ADDRESS: [address] [place] [address]   11/15/2019 -   Chief Complaint  Patient presents with   Follow-up    Pt states the cough has been better since last visit.      HPI Jeff Willis 82 y.o. -presents with his son Jeff Willis for follow-up.  He is here to discuss test results.  In the interim he called for worsening cough.  We gave him prednisone.  He tells me the prednisone actually made the cough worse particularly towards the end.  After that the son  doubled up his PPI and  also added H2 blockade and the cough went away completely.  Patient feels completely normal that he is possible that he has interstitial lung disease.  We reviewed the scan and I visualized it with him together and his son.  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  They wanted to discuss the implications of the scan in the setting of controlled acid reflux and controlled cough but he is largely asymptomatic at this point.  Noted his serologies were negative except for trace ANA and his overnight oxygen study was normal.     OV 01/17/2020   Subjective:  [name], male , DOB: May 09, 1940, age 6 y.o. years. , MRN: [id number],  ADDRESS: [address] [place] [address] PCP  Jeff Heys, MD Providers : Treatment Team:  Attending Provider: Kalman Shan, MD   Chief Complaint  Patient presents with   Follow-up    pt is here to go over ct and pft    Follow-up interstitial lung disease Follow-up associated cough partially controlled with acid reflux treatment Associated weight loss present   HPI Jeff Willis 82 y.o. -presents with his son Jeff Willis.  In the interim he is continue to lose weight.  He has lost another 3-4 pounds since June 2021.  His clothes are much looser.  He thinks he has been steadily losing weight.  His respiratory symptoms are not any worse although he says that his cough that seem to initially improve with doubling up of acid reflux therapy seems to persist.  At last visit in September 2020 when I thought the cough resolved but they tell me the cough only partially improved.  Nevertheless it is persisting and  [id number] , ADDRESS: [address] [place] [address] PCP Jeff Heys, MD Patient Care Team: Jeff Heys, MD as PCP - General (Family Medicine)  This Provider for this visit: Treatment Team:  Attending Provider: Kalman Shan, MD  FU IPF Associated weight loss +  pre-ofev   03/01/2020 -   Chief Complaint  Patient presents with   Follow-up    ILD, doing ok, cough has not improved     HPI Jeff Willis 82 y.o. -returns for follow-up.  This is after starting nintedanib.  He is currently on 100 mg twice daily for 2 weeks.  His prescription is approved on February 02, 2020.  He accidentally got 150 mg twice daily but he sent this back.  He is going to send [place] back.  He has upcoming travel to [place] later this week he will be gone there for till February 2022.  He has supply and refill of his nintedanib on 06 March 2020.  After that he does not have nintedanib.  He and his son report that somebody will be able to go to [place] and deliver it to him.  They do not want to trust the courier service to take it there the supply chain issues.  At this point time from a respiratory standpoint he has minimal dyspnea.  He still has a cough.  The cough is not worse but after reducing the PPI dose the cough has come back.  It is somewhat troublesome.  The severity score is listed below.  They report the GI Dr. Loreta Willis has reported concern about taking very high-dose of PPI and H2 blockade which actually helped the cough.  Is because of osteoporosis risk.  I agreed with that concern.  However patient is also reporting significant symptom burden.  Primary care has suggested that they follow the symptom control and quality of life issues.  I did support the primary care viewpoint as well.  Did explain to them at the end of the day as a risk-benefit ratio and with an advanced disease like pulmonary fibrosis 1 might have to consider taking a short-term again with cough control at the expense of long-term risk of side effects.  They are going to think about this.  In terms of his travel to [place].  He put me in touch with Dr. Allena Katz.  I texted him and establish contact.  He will follow with Dr. Allena Katz who is a pulmonologist trained in [place] but now living in [place].  Gave other travel  advice are listed below.  In terms of weight loss: This is ongoing.  Even after starting the Ofev is not having any GI side effects but his weight loss continues.  He had a CT abdomen that is not showing any reason for weight loss.  Apparently had some hematuria had cystoscopy with Dr. Mena Goes.  He has some radiation seed implants.  There is no bladder cancer.  The really frustrated by this.  I did pull out an article about IPF and weight loss for the son to read.- shows IPF weight loss is asssocited with worse progrnosis    ROS - per HPI    OV 07/24/2020  Subjective:  [name], male , DOB: 11-Feb-1941 , age 15 y.o. , MRN: [id number] , ADDRESS: [address] [place] [address] PCP Jeff Heys, MD Patient Care Team: Jeff Heys, MD as PCP - General (Family Medicine)  This Provider for this visit: Treatment Team:  Attending Provider: Kalman Willis,  He has diarrhea with the nintedanib.  This is on low-dose nintedanib.  Currently is on the [place] version of the nintedanib but he still losing weight and having the same GI symptoms.  In terms of his tachycardia he had echocardiogram and this is normal.  He says his wife is very concerned about his weight loss.  His pants are loose now.      ECHO MAY 2022   IMPRESSIONS     1. Left ventricular ejection fraction, by estimation, is 60 to 65%. The  left ventricle has normal function. The left ventricle has no regional  wall motion abnormalities. Left ventricular diastolic parameters are  indeterminate.   2. Right ventricular systolic function is normal. The right ventricular  size is normal. There is mildly elevated pulmonary artery systolic  pressure. The estimated right ventricular systolic pressure is 34.8 mmHg.   3. The mitral valve is normal in structure. No evidence of mitral valve  regurgitation.   4. Tricuspid valve regurgitation is mild to moderate.   5. The aortic valve is tricuspid. Aortic valve regurgitation is not  visualized. No aortic stenosis is present.   6. The inferior vena cava is normal in size with greater than 50%  respiratory variability, suggesting right atrial pressure of 3 mmHg.     OV 01/11/2021  Subjective:  [name], male , DOB: Jun 26, 1940 , age 5 y.o. , MRN: [id number] , ADDRESS: [address] [place] [address] PCP Jeff Heys, MD Patient Care Team: Jeff Heys, MD as PCP - General (Family Medicine)  This Provider for this visit: Treatment Team:  Attending Provider: Kalman Shan, MD Type of visit: Telephone/Video Circumstance: COVID-19 national emergency Identification of patient Jeff COURIER  with 1940/10/06 and MRN [id number] - 2 person identifier Risks: Risks, benefits, limitations of telephone visit explained. Patient understood and verbalized agreement to proceed Anyone else on call: no one other than patient and MD Patient location: patient home This provider location: [address]; [place], [place], [address]   01/11/2021 -  IPF followup   Idiopathic pulmonary fibrosis on nintedanib 100 mg twice daily since Oct 2021   -last CT September 2021 Weight loss unintentional even before nintedanib Cough Tachycardia NOS  HPI Jeff Willis 82 y.o. - off ofev since may 2022 Gained weight 5#/ Minimal dyspnea to no dyspnea even with exertion. [place]ento [place] in interim and had good time. Did water slide. Cough is a bit worse. Othewrise well. Enegery level goodl Though cough is mild - wants something for cough relief thought says medicine from [place] helping.  Plans to go to [place] on February 21, 2021    OV 02/06/2021  Subjective:  [name], male , DOB: November 08, 1940 , age 5 y.o. , MRN: [id number] , ADDRESS: [address] [place] [address] PCP Jeff Heys, MD Patient Care Team: Jeff Heys, MD as PCP - General (Family Medicine)  This Provider for this visit: Treatment Team:  Attending Provider: Kalman Shan, MD    02/06/2021 -   Chief Complaint  Patient presents with   Follow-up    Esbriet follow up, cough in the morning    Idiopathic pulmonary fibrosis on nintedanib 100 mg twice daily since Oct 2021   -last CT September 2022, pft oct 2022 -> progression on CT scan  -Stop nintedanib September 2022 due to weight loss and intolerance  -Started pirfenidone October 2022 Weight loss unintentional even before nintedanib  -Improved September/October 2022 after

## 2023-01-23 NOTE — Patient Instructions (Addendum)
ICD-10-CM   1. IPF (idiopathic pulmonary fibrosis) (HCC)  J84.112     2. Weight loss, non-intentional  R63.4     3. Physical deconditioning  R53.81     4. Medication monitoring encounter  Z51.81     5. Esophageal stenosis  K22.2     6. Chronic cough  R05.3     7. Travel advice encounter  Z71.84     8. Mediastinal adenopathy  R59.0     9. 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 History of prostate cancer  Z85.46        IPF (idiopathic pulmonary fibrosis) (HCC) Chronic respiratory failure after COVID-19 related hospitalization summer 2023   -PFT decreased 36% between MAy 2023 -> March 2024 but now 09/12/2022 - improved/stable; ->'s worsening again September 2024 but simple exercise hypoxemia test continues to be stable.    Plan -continue esbriet 2 pills three times daily with food    -I will send a message to our pharmacy team to see if they can do a 90-day supply for you --Monitor oxygen levels  -Goal pulse ox should be greater than 92% -LFT test 01/23/2023 - spirometry and dlco in 4 months before potential [place] trio   Weight loss, non-intentional  - losing weight again with esbriet xsince  02/06/2021 and subsequent weight loss in October 2023 to 114# following hospitalization and now 105# in March 2024 and same in September 2024  -Weight loss could be because of potential spread of prostate cancer    Plan - check weight at home (naked weight at same time daily) -monitor for weight loss with esbriet for now -Continue high-protein diet as below   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 Levy Pupa or Dr. Elige Radon Icard biopsy the lymph node via bronchoscopy method  COUGH and DYSPHAGIA and clearing throat New History of intubation - summer 2023  -  - s/p dilatation by Dr Elnoria Howard April 2023 and march 2024  - ENT eval by [facility] by Dr. Lurena Joiner in April 2024 diagnosis irritable larynx   Plan -per ENT and GI - -Respect your desire to see another GI doctor.   Physical deconditioning - new since summer 2023  -Much improved after hospitalization and inpatient rehab  but still deconditioned compared to baseline  Plan -Continue strengthening exercises -Continue high-protein vegetarian diet through legumes and tofu   Travel advice encounter  -I think statin right now no contraindication to travel to [place] by air other than overall increased risk for respiratory infections and blood clots and gaps in healthcare  Plan - Take letter for the airline requiring you to use oxygen for travel  Follow-up -6 weeks nurse practitioner visit prior to [place] trip

## 2023-01-23 NOTE — Telephone Encounter (Signed)
Rx team  The son wants 90-day supply of pirfenidone as opposed to 30 days.

## 2023-01-23 NOTE — Telephone Encounter (Signed)
Per last rx sent for Esbriet, we are sending for 90-day supply. Since it is coming from free drug program, it is the pharmacy's discretion how much they are willing to dispense.  Will send new rx pending updated LFT drawn today - will plan t include note fo pharmacy to dispense 3 month supply if possible  Chesley Mires, PharmD, MPH, BCPS, CPP Clinical Pharmacist (Rheumatology and Pulmonology)

## 2023-01-24 MED ORDER — PIRFENIDONE 267 MG PO CAPS: 534.0000 mg | ORAL_CAPSULE | Freq: Three times a day (TID) | ORAL | 2 refills | Status: DC

## 2023-01-24 NOTE — Telephone Encounter (Signed)
LFTs from 01/23/23 wnl. Rx for Esbriet 2 tabs (534mg ) three times daily sent to [facility] with note to dispense 3 month supply per fill if possible

## 2023-02-11 ENCOUNTER — Telehealth: Payer: Self-pay | Admitting: Internal Medicine

## 2023-02-11 NOTE — Telephone Encounter (Signed)
Pt son called in bc patient is going away and Needs a liter flow on prescription for his flight  Fax: [phone]

## 2023-02-12 NOTE — Telephone Encounter (Signed)
Jeff Willis checking on message for prescription for flight. Patient leaving tomorrow morning. Jeff Willis will come by in the morning to pick up prescription. Jeff Willis phone number is [phone].

## 2023-02-12 NOTE — Progress Notes (Signed)
This encounter was created in error - please disregard.

## 2023-02-12 NOTE — Telephone Encounter (Addendum)
Called Jeff Willis.  Patient is needing a written script stating how many liters of oxygen per minute he needs in order to have the oxygen while on the plane while traveling. Dr. Wynona Neat is in clinic and will sign letter for patient.  Letter printed and signed by Dr. Wynona Neat.  Patient informed and will pick up tomorrow morning before 10:00 am.

## 2023-03-06 ENCOUNTER — Encounter: Payer: Self-pay | Admitting: Internal Medicine

## 2023-03-06 ENCOUNTER — Ambulatory Visit: Payer: Medicare Other | Admitting: Internal Medicine

## 2023-03-06 ENCOUNTER — Telehealth: Payer: Self-pay | Admitting: Internal Medicine

## 2023-03-06 VITALS — BP 108/67 | HR 67 | Ht 66.0 in | Wt 104.0 lb

## 2023-03-06 DIAGNOSIS — J84112 Idiopathic pulmonary fibrosis: Secondary | ICD-10-CM

## 2023-03-06 DIAGNOSIS — C384 Malignant neoplasm of pleura: Secondary | ICD-10-CM

## 2023-03-06 DIAGNOSIS — R5381 Other malaise: Secondary | ICD-10-CM

## 2023-03-06 DIAGNOSIS — R634 Abnormal weight loss: Secondary | ICD-10-CM | POA: Diagnosis not present

## 2023-03-06 DIAGNOSIS — Z5181 Encounter for therapeutic drug level monitoring: Secondary | ICD-10-CM | POA: Diagnosis not present

## 2023-03-06 DIAGNOSIS — Z7184 Encounter for health counseling related to travel: Secondary | ICD-10-CM

## 2023-03-06 MED ORDER — PIRFENIDONE 267 MG PO TABS
ORAL_TABLET | ORAL | 1 refills | Status: DC
Start: 2023-03-06 — End: 2023-07-16

## 2023-03-06 NOTE — Telephone Encounter (Signed)
Rx for Esbriet 534 mg three times daily TABLETS sent to [facility]. Noted in  Patient has been filling capsules for past several months  Chesley Mires, PharmD, MPH, BCPS, CPP Clinical Pharmacist (Rheumatology and Pulmonology)

## 2023-03-06 NOTE — Patient Instructions (Addendum)
ICD-10-CM   1. IPF (idiopathic pulmonary fibrosis) (HCC)  J84.112     2. Weight loss, non-intentional  R63.4     3. Physical deconditioning  R53.81     4. Medication monitoring encounter  Z51.81     5. Esophageal stenosis  K22.2     6. Chronic cough  R05.3     7. Travel advice encounter  Z71.84     8. Mediastinal adenopathy  R59.0     9. 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 History of prostate cancer  Z85.46        IPF (idiopathic pulmonary fibrosis) (HCC) Chronic respiratory failure after COVID-19 related hospitalization summer 2023   -PFT decreased 36% between MAy 2023 -> March 2024 but now 09/12/2022 - improved/stable; ->'s worsening again September 2024 but simple exercise hypoxemia test continues to be stable.    Plan -continue esbriet 2 pills three times daily with food    - sent message to pharmacy team to ensure you get the PILL and not Capulse --Monitor oxygen levels  -Goal pulse ox should be greater than 92% -LFT test in [place] in Jan 2025 on your own - spirometry and dlco in April [id number] upon return from [place]   Weight loss, non-intentional  - losing weight again with esbriet xsince  02/06/2021 and subsequent weight loss in October 2023 to 114# following hospitalization and now 105# in March 2024 and same in September 2024  -Weight loss could be because of potential spread of prostate cancer    Plan - check weight at home (naked weight at same time daily) -monitor for weight loss with esbriet for now -Continue high-protein diet as below   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 Lurena Joiner in April 2024 diagnosis irritable  larynx   Plan -per ENT and GI - -Respect your desire to see another GI doctor.   Physical deconditioning - new since summer 2023  -Much improved after hospitalization and inpatient rehab  but still deconditioned compared to baseline  Plan -Continue strengthening exercises -Continue high-protein vegetarian diet through legumes and tofu   Travel advice encounter  -No contraindication to travel to [place]  end nov 2023 by air other than overall increased risk for respiratory infections and blood clots and gaps in healthcare  Plan - Take letter for the airline requiring you to use oxygen for travel 03/06/2023   Follow-up  - April 2025 - 30 min visit after back from [place]   - symptom score and exercise hypoxemia test

## 2023-03-06 NOTE — Telephone Encounter (Signed)
Jeff Willis  Patient and his son Jeff Willis state that there was sent to [facility] pirfenidone capsules instead of tablets.  He cannot swallow capsules because of his dysphagia.  Please make sure they send the tablet and soft capsule.  He is going to [place] in a couple of weeks.

## 2023-03-06 NOTE — Progress Notes (Signed)
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   Prior cough in  2019 for several months  resolved on on some kind of powder inhaler x one week = Advair     History of Present Illness  08/04/2019  Pulmonary/ 1st office eval/[name]  Chief Complaint  Patient presents with   Pulmonary Consult    Referred by Dr Manus Gunning. Pt c/o non prod cough and throat clearing x 6 months.   Dyspnea:  None/ no aerobics, walking neighborhoods ok  Cough: always dry/ any candy helps if keeps in mouth but esp hard candy / does not use cough drops  Sleep: rotates on side, bed is flat/ one pillow, never wakes up cough but /win 15 min of stirring recurs daily  SABA use: never, also never prednisone Always trouble swallowing pills x lifetime  Call Dr Versie Starks office and ask what inhaler you received in 2019 as sample that seemed to help your cough = advair dpi Stop cetrizine, flonase  Prednisone 10 mg take  4 each am x 2 days,   2 each am x 2 days,  1 each am x 2 days and stop  Pantoprazole (protonix) 40 mg   Take  30-60 min before first meal of the day and Pepcid (famotidine)  20 mg one after supper  until return to office  GERD   Gabapentin 100 mg four times daily (ok to stop when no coughing at all for a week)  Please schedule a follow up office visit in 4 weeks, sooner if needed  with all medications /inhalers/ solutions in hand so we can verify exactly what you are taking. This includes all  medications from all doctors and over the counters    09/06/2019  f/u ov/[name] re: uacs on gabapenitn 100 mg three times  wolicki had tried gabapentin but failed to reveal that at last ov when I recommended it  Chief Complaint  Patient presents with   Follow-up    4 wk f/u. Wants to discuss switching Advair.   Dyspnea:  No  Cough: better just while on prednisone maybe 50-80%   Sleeping: no noct cough SABA use: none 02: none  rec Gabapentin 200 mg up to 4 x daily  Pantoprazole (protonix) 40 mg   Take  30-60 min before first meal of the day and Pepcid (famotidine)  20 mg one @  bedtime until return to office - this is the best way to tell whether stomach acid is contributing to your problem.   Keep the candy handy  Prednisone 10 mg  Take 4 for three days 3 for three days 2 for three days 1 for three days and stop  If not better in 2 weeks see Dr Loreta Ave for your reflux  Please remember to go to the lab department and xray dept  for your tests - we  will call you with the results when they are available.         OV 10/05/2019  Subjective:  Patient ID: Lindley Magnus, male , DOB: [birth year] , age 72 y.o. , MRN: [id number] , ADDRESS: [address] [place] [address]   10/05/2019 -   Chief Complaint  Patient presents with   Follow-up    Pt states he has been doing okay since last visit and denies any complaints.   -82 year old [place] male.  Referred for interstitial lung disease.  He is accompanied by his son Mikey College who is a Teacher, early years/pre.  I know [name]  from a few years ago when heused to accompany his aunt; asthma patient of mine. Jamarl TONG PIECZYNSKI  has has been referred by Dr. Sandrea Hughs to the ILD center.  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  Up until the pandemic used to travel quite frequently.  He is to go to [place] for 3 months.  At none of the properties he is being exposed to mold.  He developed insidious onset of chronic cough over a year ago  this has been persistent.  In the last few months several cough related treatments have been tried with some improvement.  Has been on gabapentin but this made him sleepy and is currently stopped it.  Because of chronic cough which he only attributes his constant clearing of the throat he had a high-resolution CT scan of the chest that I personally visualized and shows probable UIP pattern and therefore he has been referred here.  In my personal visualization it might even be definite of UIP because there might be some early honeycombing there.  He denies any shortness of breath.  At rest he was found to be tachycardic but he says this can at baseline for him.  He will follow this up with his primary care physician.  [place] Integrated Comprehensive( ILD Questionnaire  Symptoms:   He denies any shortness of breath.  No episodic shortness of breath.  However on the questioning he does report mild shortness of breath climbing up stairs.  In terms of his cough it started in June 2020.  It is the same since it started maybe in the last week it is slightly better.  Is moderate in intensity.  He does clear the throat.  Mostly dry cough occasionally white sputum.  Does feel a tickle in the back of his throat.  He is not waking up in the middle of the night.  Occasionally affects his voice.  No nausea no vomiting no diarrhea no wheezing.   Past Medical History : Denies any asthma COPD or heart failure rheumatoid arthritis or scleroderma or lupus or polymyositis or Sjogren's.  Denies sleep apnea.  Denies HIV denies pulmonary hypertension.  Denies diabetes or thyroid disease or stroke or seizures mononucleosis.  Denies any hepatitis or tuberculosis or kidney disease or pneumonia or blood clots or heart disease or pleurisy.  Does have on and off acid reflux for the last year   ROS: Positive for dry mouth for the last several months.  He also has some dysphagia for swallowing pills unclear for what duration.  He  also has acid reflux on PPI and H2 blocker.  Denies any Raynaud's or recurrent fever or weight loss.  No nausea no vomiting.   FAMILY HISTORY of LUNG DISEASE: * -Denies family history of COPD or asthma sarcoid or cystic fibrosis of hypersensitive pneumonitis or autoimmune disease.   EXPOSURE HISTORY:  Denies any Covid.  Denies smoking cigarettes.  Denies marijuana denies smoking.  Denies IV drug use denies cocaine   HOME and HOBBY DETAILS : Single-family home [place] for the last 10 years.  Age of the home is 15 years.  No dampness.  No mildew.  No humidifier use.  No CPAP use no nebulizer machine use.  He does use a steam iron but there is no mold or mildew in it.  There is no [place] inside the house no pet birds or parakeets.  No pet gerbils no feather pillows no mold in the [facility] duct no music habits.  He does do some gardening very small garden in the house limited gardening mostly just water the garden.  No bird feather exposure no flood damage no strong mats no hot tub or Jacuzzi.  No exposure to animals at work.   OCCUPATIONAL HISTORY (122 questions) :   -Organic antigen exposure is negative inorganic exposure is negative   PULMONARY TOXICITY HISTORY (27 items):  -18-day prednisone course that ended last week.  He did have seen implantation for prostate cancer in October 2019 these were radiation seeds.      Results for MARQUIZE, SEIB (MRN [id number]) as of 11/15/2019 16:27  Ref. Range 09/07/2019 11:29 10/05/2019 11:25  Anti Nuclear Antibody (ANA) Latest Ref Range: NEGATIVE   POSITIVE (A)  ANA Pattern 1 Unknown  Nuclear, Speckled (A)  ANA Titer 1 Latest Units: titer  1:40 (H)  Angiotensin-Converting Enzyme Latest Ref Range: 9 - 67 U/L  34  Cyclic Citrullin Peptide Ab Latest Units: UNITS  <16  ds DNA Ab Latest Units: IU/mL  <1  Myeloperoxidase Abs Latest Units: AI  <1.0  Serine Protease 3 Latest Units: AI  <1.0  RA Latex Turbid. Latest Ref Range: <14 IU/mL  <14   IgE (Immunoglobulin E), Serum Latest Ref Range: <OR=114 kU/L 36   SSA (Ro) (ENA) Antibody, IgG Latest Ref Range: <1.0 NEG AI  <1.0 NEG  SSB (La) (ENA) Antibody, IgG Latest Ref Range: <1.0 NEG AI  <1.0 NEG  Scleroderma (Scl-70) (ENA) Antibody, IgG Latest Ref Range: <1.0 NEG AI  <1.0 NEG     Simple office walk 185 feet x  3 laps goal with forehead probe 10/05/2019   O2 used ra  Number laps completed 3  Comments about pace avt  Resting Pulse Ox/HR 100% and 122/min  Final Pulse Ox/HR 100% and 143/min  Desaturated </= 88% x  Desaturated <= 3% points x  Got Tachycardic >/= 90/min x  Symptoms at end of test x  Miscellaneous comments x    Results for MAYAN, KLOEPFER (MRN [id number]) as of 10/05/2019 10:40  Ref. Range 09/07/2019 11:29  Eosinophils Absolute Latest Ref Range: 0.0 - 0.7 K/uL 0.1     High-resolution CT chest Sep 15, 2019: Personally visualized and agree with the findings below IMPRESSION: 1. Spectrum of findings compatible with basilar predominant fibrotic interstitial lung disease with suggestion of early honeycombing. 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 Am Rosezetta Schlatter Crit Care Med Vol 198, Iss 5, [phone], Dec 28 2016. 2. Ectatic 4.4 cm ascending thoracic aorta. Recommend annual imaging followup by CTA or MRA. This recommendation follows 2010 ACCF/AHA/AATS/ACR/ASA/SCA/SCAI/SIR/STS/SVM Guidelines for the Diagnosis and Management of Patients with Thoracic Aortic Disease. Circulation. 2010; 121: H086-V784. Aortic aneurysm NOS (ICD10-I71.9). 3. One vessel coronary atherosclerosis. 4. Aortic Atherosclerosis (ICD10-I70.0).     Electronically  Signed   By: Delbert Phenix M.D.   On: 09/15/2019 14:26     Results for ZEPHYR, RIDLEY (MRN [id number]) as of 10/05/2019 10:40  Ref. Range 09/07/2019 11:29  Sheep Sorrel IgE Latest Units: kU/L  <0.10  Pecan/Hickory Tree IgE Latest Units: kU/L <0.10  IgE (Immunoglobulin E), Serum Latest Ref Range: <OR=114 kU/L 36  Allergen, D pternoyssinus,d7 Latest Units: kU/L <0.10  Cat Dander Latest Units: kU/L <0.10  Dog Dander Latest Units: kU/L <0.10  [place] Grass Latest Units: kU/L <0.10  Johnson Grass Latest Units: kU/L <0.10  Timothy Grass Latest Units: kU/L <0.10  Cockroach Latest Units: kU/L <0.10  Aspergillus fumigatus, m3 Latest Units: kU/L <0.10  Allergen, Comm Silver Charletta Cousin, t9 Latest Units: kU/L <0.10  Allergen, Cottonwood, t14 Latest Units: kU/L <0.10  Elm IgE Latest Units: kU/L <0.10  Allergen, Mulberry, t76 Latest Units: kU/L <0.10  Allergen, Oak,t7 Latest Units: kU/L <0.10  COMMON RAGWEED (SHORT) (W1) IGE Latest Units: kU/L 0.15 (H)  Allergen, Mouse Urine Protein, e78 Latest Units: kU/L <0.10  D. farinae Latest Units: kU/L <0.10  Allergen, Cedar tree, t12 Latest Units: kU/L <0.10  Box Elder IgE Latest Units: kU/L <0.10  Rough Pigweed  IgE Latest Units: kU/L <0.10    ROS - per HPI   OV 11/15/2019  Subjective:  Patient ID: Lindley Magnus, male , DOB: 11/06/1940 , age 4 y.o. , MRN: [id number] , ADDRESS: [address] [place] [address]   11/15/2019 -   Chief Complaint  Patient presents with   Follow-up    Pt states the cough has been better since last visit.      HPI Kemani G Brendle 82 y.o. -presents with his son Mikey College for follow-up.  He is here to discuss test results.  In the interim he called for worsening cough.  We gave him prednisone.  He tells me the prednisone actually made the cough worse particularly towards the end.  After that the son  doubled up his PPI and also added H2 blockade and the cough went away completely.  Patient feels completely normal that he is possible that he has interstitial lung disease.  We reviewed the scan and I visualized it with him together and his son.  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  They wanted to discuss the implications  of the scan in the setting of controlled acid reflux and controlled cough but he is largely asymptomatic at this point.  Noted his serologies were negative except for trace ANA and his overnight oxygen study was normal.     OV 01/17/2020   Subjective:  Patient ID: Lindley Magnus, male , DOB: [birth year], age 46 y.o. years. , MRN: [id number],  ADDRESS: [address] [place] [address] PCP  Blair Heys, MD Providers : Treatment Team:  Attending Provider: Kalman Shan, MD   Chief Complaint  Patient presents with   Follow-up    pt is here to go over ct and pft    Follow-up interstitial lung disease Follow-up associated cough partially controlled with acid reflux treatment Associated weight loss present   HPI Steffan G Reny 82 y.o. -presents with his son Mikey [employer].  In the interim he is continue to lose weight.  He has lost another 3-4 pounds since June 2021.  His clothes are much looser.  He thinks he has been steadily losing weight.  His respiratory symptoms are not any worse although he says that his cough that seem to initially improve with doubling up of acid reflux therapy  seems to persist.  At last visit in September 2020 when I thought the cough resolved but they tell me the cough only partially improved.  Nevertheless it is persisting and they are worried about it.  In addition he is worried about weight loss.  He also has poor appetite.  He is not in any pulmonary fibrosis antifibrotic's.  There is no diarrhea.  Symptom score suggest some element of anxiety since his last visit.  He has had pulmonary function test and it shows his FVC is actually better but his DLCO is actually worse.  He had high-resolution CT chest that I visualized and reviewed with him.  It shows probable UIP with possible emerging honeycombing.  I agree with the radiologist.  The radiologist actually feels that it might be a little bit worse compared to few months ago.  Of note: He has  upcoming trip to [place] between March 05, 2020 and February 2022.  He wants to make sure his care is coordinated.  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 Evaluate for pulmonary fibrosis.   EXAM: CT CHEST WITHOUT CONTRAST   TECHNIQUE: Multidetector CT imaging of the chest was performed following the standard protocol without intravenous contrast. High resolution imaging of the lungs, as well as inspiratory and expiratory imaging, was performed.   COMPARISON:  Chest CT 09/15/2019.   FINDINGS: Cardiovascular: Heart size is normal. There is no significant pericardial fluid, thickening or pericardial calcification. There is aortic atherosclerosis, as well as atherosclerosis of the great vessels of the mediastinum and the coronary arteries, including calcified atherosclerotic plaque in the left main, left anterior descending and right coronary arteries. Dilatation of the ascending thoracic aorta measuring 4.5 cm in diameter.   Mediastinum/Nodes: No pathologically enlarged mediastinal or hilar lymph nodes. Please note that accurate exclusion of hilar adenopathy is limited on noncontrast CT scans. Esophagus is unremarkable in appearance. No axillary lymphadenopathy.   Lungs/Pleura: High-resolution images demonstrate widespread areas of ground-glass attenuation, septal thickening, thickening of the peribronchovascular interstitium, mild cylindrical bronchiectasis and peripheral bronchiolectasis. There is a suggestion of, but not definitive evidence of, early honeycombing in portions of the lungs. These findings do have a definitive craniocaudal gradient and appear slightly progressive compared to the prior study. Inspiratory and expiratory imaging is unremarkable. No acute consolidative airspace disease. No pleural effusions. No definite suspicious  appearing pulmonary nodules or masses are noted.   Upper Abdomen: Cortical calcification in the upper pole of the right kidney incidentally noted.   Musculoskeletal: There are no aggressive appearing lytic or blastic lesions noted in the visualized portions of the skeleton.   IMPRESSION: 1. The appearance of the lungs is compatible with progressive interstitial lung disease categorized as probable usual interstitial pneumonia (UIP) per current ATS guidelines. 2. Aortic atherosclerosis, in addition to left main and 2 vessel coronary artery disease. In addition, there is mild aneurysmal dilatation of the ascending thoracic aorta (4.5 cm in diameter). Ascending thoracic aortic aneurysm. Recommend semi-annual imaging followup by CTA or MRA and referral to cardiothoracic surgery if not already obtained. This recommendation follows 2010 ACCF/AHA/AATS/ACR/ASA/SCA/SCAI/SIR/STS/SVM Guidelines for the Diagnosis and Management of Patients With Thoracic Aortic Disease. Circulation. 2010; 121: Z610-R604. Aortic aneurysm NOS (ICD10-I71.9).   Aortic Atherosclerosis (ICD10-I70.0). Aortic aneurysm NOS (ICD10-I71.9).     Electronically Signed   By: Trudie Reed M.D.   On: 01/13/2020  15:15    ROS - per HPI    OV 03/01/2020  Subjective:  Patient ID: Lindley Magnus, male , DOB: 01-15-1941 , age 36 y.o. , MRN: [id number] , ADDRESS: [address] [place] [address] PCP Blair Heys, MD Patient Care Team: Blair Heys, MD as PCP - General (Family Medicine)  This Provider for this visit: Treatment Team:  Attending Provider: Kalman Shan, MD  FU IPF Associated weight loss + pre-ofev   03/01/2020 -   Chief Complaint  Patient presents with   Follow-up    ILD, doing ok, cough has not improved     HPI Deklen G Stroble 82 y.o. -returns for follow-up.  This is after starting nintedanib.  He is currently on 100 mg twice daily for 2 weeks.  His prescription is  approved on February 02, 2020.  He accidentally got 150 mg twice daily but he sent this back.  He is going to send [place] back.  He has upcoming travel to [place] later this week he will be gone there for till February 2022.  He has supply and refill of his nintedanib on 06 March 2020.  After that he does not have nintedanib.  He and his son report that somebody will be able to go to [place] and deliver it to him.  They do not want to trust the courier service to take it there the supply chain issues.  At this point time from a respiratory standpoint he has minimal dyspnea.  He still has a cough.  The cough is not worse but after reducing the PPI dose the cough has come back.  It is somewhat troublesome.  The severity score is listed below.  They report the GI Dr. Loreta Ave has reported concern about taking very high-dose of PPI and H2 blockade which actually helped the cough.  Is because of osteoporosis risk.  I agreed with that concern.  However patient is also reporting significant symptom burden.  Primary care has suggested that they follow the symptom control and quality of life issues.  I did support the primary care viewpoint as well.  Did explain to them at the end of the day as a risk-benefit ratio and with an advanced disease like pulmonary fibrosis 1 might have to consider taking a short-term again with cough control at the expense of long-term risk of side effects.  They are going to think about this.  In terms of his travel to [place].  He put me in touch with Dr. Allena Katz.  I texted him and establish contact.  He will follow with Dr. Allena Katz who is a pulmonologist trained in [place] but now living in [place].  Gave other travel advice are listed below.  In terms of weight loss: This is ongoing.  Even after starting the Ofev is not having any GI side effects but his weight loss continues.  He had a CT abdomen that is not showing any reason for weight loss.  Apparently had some hematuria had cystoscopy with Dr. Mena Goes.  He  has some radiation seed implants.  There is no bladder cancer.  The really frustrated by this.  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   Prior cough in  2019 for several months  resolved on on some kind of powder inhaler x one week = Advair     History of Present Illness  08/04/2019  Pulmonary/ 1st office eval/[name]  Chief Complaint  Patient presents with   Pulmonary Consult    Referred by Dr Manus Gunning. Pt c/o non prod cough and throat clearing x 6 months.   Dyspnea:  None/ no aerobics, walking neighborhoods ok  Cough: always dry/ any candy helps if keeps in mouth but esp hard candy / does not use cough drops  Sleep: rotates on side, bed is flat/ one pillow, never wakes up cough but /win 15 min of stirring recurs daily  SABA use: never, also never prednisone Call Dr Versie Starks office and ask what inhaler you received in 2019 as sample that seemed to help your cough = advair dpi Stop cetrizine, flonase  Prednisone 10 mg take  4 each am x 2 days,   2 each am x 2 days,  1 each am x 2 days and stop  Pantoprazole (protonix) 40 mg   Take  30-60 min before first meal of the day and Pepcid (famotidine)  20 mg one after supper  until return to office  GERD   Gabapentin 100 mg  four times daily (ok to stop when no coughing at all for a week)  Please schedule a follow up office visit in 4 weeks, sooner if needed  with all medications /inhalers/ solutions in hand so we can verify exactly what you are taking. This includes all medications from all doctors and over the counters    09/06/2019  f/u ov/[name] re: uacs on gabapenitn 100 mg three times  wolicki had tried gabapentin but failed to reveal that at last ov when I recommended it  Chief Complaint  Patient presents with   Follow-up    4 wk f/u. Wants to discuss switching Advair.   Dyspnea:  No  Cough: better just while on prednisone maybe 50-80%   Sleeping: no noct cough SABA use: none 02: none  rec Gabapentin 200 mg up to 4 x daily  Pantoprazole (protonix) 40 mg   Take  30-60 min before first meal of the day and Pepcid (famotidine)  20 mg one @  bedtime until return to office - this is the best way to tell whether stomach acid is contributing to your problem.   Keep the candy handy  Prednisone 10 mg  Take 4 for three days 3 for three days 2 for three days 1 for three days and stop  If not better in 2 weeks see Dr Loreta Ave for your reflux  Please remember to go to the lab department and xray dept  for your tests - we will call you with the results when they are available.         OV 10/05/2019  Subjective:  Patient ID: Lindley Magnus, male ,  DOB: [birth year] , age 89 y.o. , MRN: [id number] , ADDRESS: [address] [place] [address]   10/05/2019 -   Chief Complaint  Patient presents with   Follow-up    Pt states he has been doing okay since last visit and denies any complaints.   -82 year old [place] male.  Referred for interstitial lung disease.  He is accompanied by his son Mikey [employer] who is a Teacher, early years/pre.  I know [name]  from a few years ago when heused to accompany his aunt; asthma patient of mine. Johnpatrick AMARIE TARTE  has has been referred by Dr. Sandrea Hughs to the ILD center.  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  Up until the pandemic used to travel quite frequently.  He is to go to [place] for 3 months.  At none of the properties he is being exposed to mold.  He developed insidious onset of chronic cough over a year ago this has been persistent.  In the last few months several cough related treatments have been tried with some improvement.  Has been on gabapentin but this made him sleepy and is currently stopped it.  Because of chronic cough which he only attributes his constant clearing of the throat he had a high-resolution CT scan of the chest that I personally visualized and shows probable UIP pattern and therefore he has been referred here.  In my personal visualization it might even be definite of UIP because there might be some early honeycombing there.  He denies any shortness of breath.  At rest he was found to be tachycardic but he says this can at baseline for him.  He will follow this up with his primary care physician.  [place] [facility]( ILD Questionnaire  Symptoms:   He denies any shortness of breath.  No episodic shortness of breath.  However on the questioning he does report mild shortness of breath climbing up stairs.  In terms of his cough it started in June 2020.  It is the same since it started maybe in the last week it is slightly better.  Is moderate in intensity.  He does clear the throat.  Mostly dry cough occasionally white sputum.  Does feel a tickle in the back of his throat.  He is not waking up in the middle of the night.  Occasionally affects his voice.  No nausea no vomiting no diarrhea no wheezing.     Past Medical History : Denies any asthma COPD or heart failure rheumatoid arthritis or scleroderma or lupus or polymyositis or Sjogren's.  Denies sleep apnea.  Denies HIV denies pulmonary hypertension.  Denies diabetes or thyroid disease or stroke or seizures mononucleosis.  Denies any hepatitis or tuberculosis or kidney  disease or pneumonia or blood clots or heart disease or pleurisy.  Does have on and off acid reflux for the last year   ROS: Positive for dry mouth for the last several months.  He also has some dysphagia for swallowing pills unclear for what duration.  He also has acid reflux on PPI and H2 blocker.  Denies any Raynaud's or recurrent fever or weight loss.  No nausea no vomiting.   FAMILY HISTORY of LUNG DISEASE: * -Denies family history of COPD or asthma sarcoid or cystic fibrosis of hypersensitive pneumonitis or autoimmune disease.   EXPOSURE HISTORY: Denies any Covid.  Denies smoking cigarettes.  Denies marijuana denies smoking.  Denies IV drug use denies cocaine   HOME and HOBBY DETAILS : [place]  [place] for the last 10 years.  Age of the home is 15 years.  No dampness.  No mildew.  No humidifier use.  No CPAP use no nebulizer machine use.  He does use a steam iron but there is no mold or mildew in it.  There is no [place] inside the house no pet birds or parakeets.  No pet gerbils no feather pillows no mold in the [facility] duct no music habits.  He does do some gardening very small garden in the house limited gardening mostly just water the garden.  No bird feather exposure no flood damage no strong mats no hot tub or Jacuzzi.  No exposure to animals at work.   OCCUPATIONAL HISTORY (122 questions) :   -Organic antigen exposure is negative inorganic exposure is negative   PULMONARY TOXICITY HISTORY (27 items):  -18-day prednisone course that ended last week.  He did have seen implantation for prostate cancer in October 2019 these were radiation seeds.      Results for HANIEL, FIX (MRN [id number]) as of 11/15/2019 16:27  Ref. Range 09/07/2019 11:29 10/05/2019 11:25  Anti Nuclear Antibody (ANA) Latest Ref Range: NEGATIVE   POSITIVE (A)  ANA Pattern 1 Unknown  Nuclear, Speckled (A)  ANA Titer 1 Latest Units: titer  1:40 (H)  Angiotensin-Converting Enzyme  Latest Ref Range: 9 - 67 U/L  34  Cyclic Citrullin Peptide Ab Latest Units: UNITS  <16  ds DNA Ab Latest Units: IU/mL  <1  Myeloperoxidase Abs Latest Units: AI  <1.0  Serine Protease 3 Latest Units: AI  <1.0  RA Latex Turbid. Latest Ref Range: <14 IU/mL  <14  IgE (Immunoglobulin E), Serum Latest Ref Range: <OR=114 kU/L 36   SSA (Ro) (ENA) Antibody, IgG Latest Ref Range: <1.0 NEG AI  <1.0 NEG  SSB (La) (ENA) Antibody, IgG Latest Ref Range: <1.0 NEG AI  <1.0 NEG  Scleroderma (Scl-70) (ENA) Antibody, IgG Latest Ref Range: <1.0 NEG AI  <1.0 NEG     Simple office walk 185 feet x  3 laps goal with forehead probe 10/05/2019   O2 used ra  Number laps completed 3  Comments about pace avt  Resting Pulse Ox/HR 100% and 122/min  Final Pulse Ox/HR 100% and 143/min  Desaturated </= 88% x  Desaturated <= 3% points x  Got Tachycardic >/= 90/min x  Symptoms at end of test x  Miscellaneous comments x    Results for HEARL, HEIKES (MRN [id number]) as of 10/05/2019 10:40  Ref. Range 09/07/2019 11:29  Eosinophils Absolute Latest Ref Range: 0.0 - 0.7 K/uL 0.1     High-resolution CT chest Sep 15, 2019: Personally visualized and agree with the findings below IMPRESSION: 1. Spectrum of findings compatible with basilar predominant fibrotic interstitial lung disease with suggestion of early honeycombing. 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 Am Rosezetta Schlatter Crit Care Med Vol 198, Iss 5, [phone], Dec 28 2016. 2. Ectatic 4.4 cm ascending thoracic aorta. Recommend annual imaging followup by CTA or MRA. This recommendation follows 2010 ACCF/AHA/AATS/ACR/ASA/SCA/SCAI/SIR/STS/SVM Guidelines for the Diagnosis and Management of Patients with Thoracic Aortic Disease. Circulation. 2010; 121: Q034-V425. Aortic aneurysm NOS (ICD10-I71.9). 3. One vessel coronary  atherosclerosis. 4. Aortic Atherosclerosis (ICD10-I70.0).     Electronically Signed   By: Delbert Phenix M.D.   On: 09/15/2019 14:26     Results for EFRAIN, CLAUSON (MRN [id number]) as of 10/05/2019  10:40  Ref. Range 09/07/2019 11:29  Sheep Sorrel IgE Latest Units: kU/L <0.10  Pecan/Hickory Tree IgE Latest Units: kU/L <0.10  IgE (Immunoglobulin E), Serum Latest Ref Range: <OR=114 kU/L 36  Allergen, D pternoyssinus,d7 Latest Units: kU/L <0.10  Cat Dander Latest Units: kU/L <0.10  Dog Dander Latest Units: kU/L <0.10  [place] Grass Latest Units: kU/L <0.10  Johnson Grass Latest Units: kU/L <0.10  Timothy Grass Latest Units: kU/L <0.10  Cockroach Latest Units: kU/L <0.10  Aspergillus fumigatus, m3 Latest Units: kU/L <0.10  Allergen, Comm Silver Charletta Cousin, t9 Latest Units: kU/L <0.10  Allergen, Cottonwood, t14 Latest Units: kU/L <0.10  Elm IgE Latest Units: kU/L <0.10  Allergen, Mulberry, t76 Latest Units: kU/L <0.10  Allergen, Oak,t7 Latest Units: kU/L <0.10  COMMON RAGWEED (SHORT) (W1) IGE Latest Units: kU/L 0.15 (H)  Allergen, Mouse Urine Protein, e78 Latest Units: kU/L <0.10  D. farinae Latest Units: kU/L <0.10  Allergen, Cedar tree, t12 Latest Units: kU/L <0.10  Box Elder IgE Latest Units: kU/L <0.10  Rough Pigweed  IgE Latest Units: kU/L <0.10    ROS - per HPI   OV 11/15/2019  Subjective:  Patient ID: Lindley Magnus, male , DOB: 01-Jan-1941 , age 39 y.o. , MRN: [id number] , ADDRESS: [address] [place] [address]   11/15/2019 -   Chief Complaint  Patient presents with   Follow-up    Pt states the cough has been better since last visit.      HPI Chavis G Fulcher 82 y.o. -presents with his son [employer] for follow-up.  He is here to discuss test results.  In the interim he called for worsening cough.  We gave him prednisone.  He tells me the prednisone actually made the cough worse particularly towards the end.  After that the son  doubled up his PPI and  also added H2 blockade and the cough went away completely.  Patient feels completely normal that he is possible that he has interstitial lung disease.  We reviewed the scan and I visualized it with him together and his son.  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  They wanted to discuss the implications of the scan in the setting of controlled acid reflux and controlled cough but he is largely asymptomatic at this point.  Noted his serologies were negative except for trace ANA and his overnight oxygen study was normal.     OV 01/17/2020   Subjective:  Patient ID: Lindley Magnus, male , DOB: 1940-07-01, age 31 y.o. years. , MRN: [id number],  ADDRESS: [address] [place] [address] PCP  Blair Heys, MD Providers : Treatment Team:  Attending Provider: Kalman Shan, MD   Chief Complaint  Patient presents with   Follow-up    pt is here to go over ct and pft    Follow-up interstitial lung disease Follow-up associated cough partially controlled with acid reflux treatment Associated weight loss present   HPI Karmine G Rocchio 82 y.o. -presents with his son [employer].  In the interim he is continue to lose weight.  He has lost another 3-4 pounds since June 2021.  His clothes are much looser.  He thinks he has been steadily losing weight.  His respiratory symptoms are not any worse although he says that his cough that seem to initially improve with doubling up of acid reflux therapy seems to persist.  At last visit in September 2020 when I thought the cough resolved but they tell me the cough only partially improved.  Nevertheless it  is persisting and they are worried about it.  In addition he is worried about weight loss.  He also has poor appetite.  He is not in any pulmonary fibrosis antifibrotic's.  There is no diarrhea.  Symptom score suggest some element of anxiety since his last visit.  He has had pulmonary function test and it shows his FVC is actually better but his DLCO is actually  worse.  He had high-resolution CT chest that I visualized and reviewed with him.  It shows probable UIP with possible emerging honeycombing.  I agree with the radiologist.  The radiologist actually feels that it might be a little bit worse compared to few months ago.  Of note: He has upcoming trip to [place] between March 05, 2020 and February 2022.  He wants to make sure his care is coordinated.  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 Evaluate for pulmonary fibrosis.   EXAM: CT CHEST WITHOUT CONTRAST   TECHNIQUE: Multidetector CT imaging of the chest was performed following the standard protocol without intravenous contrast. High resolution imaging of the lungs, as well as inspiratory and expiratory imaging, was performed.   COMPARISON:  Chest CT 09/15/2019.   FINDINGS: Cardiovascular: Heart size is normal. There is no significant pericardial fluid, thickening or pericardial calcification. There is aortic atherosclerosis, as well as atherosclerosis of the great vessels of the mediastinum and the coronary arteries, including calcified atherosclerotic plaque in the left main, left anterior descending and right coronary arteries. Dilatation of the ascending thoracic aorta measuring 4.5 cm in diameter.   Mediastinum/Nodes: No pathologically enlarged mediastinal or hilar lymph nodes. Please note that accurate exclusion of hilar adenopathy is limited on noncontrast CT scans. Esophagus is unremarkable in appearance. No axillary lymphadenopathy.   Lungs/Pleura: High-resolution images demonstrate widespread areas of ground-glass attenuation, septal thickening, thickening of the peribronchovascular interstitium, mild cylindrical bronchiectasis and peripheral bronchiolectasis. There is a suggestion of, but not definitive evidence of, early honeycombing  in portions of the lungs. These findings do have a definitive craniocaudal gradient and appear slightly progressive compared to the prior study. Inspiratory and expiratory imaging is unremarkable. No acute consolidative airspace disease. No pleural effusions. No definite suspicious appearing pulmonary nodules or masses are noted.   Upper Abdomen: Cortical calcification in the upper pole of the right kidney incidentally noted.   Musculoskeletal: There are no aggressive appearing lytic or blastic lesions noted in the visualized portions of the skeleton.   IMPRESSION: 1. The appearance of the lungs is compatible with progressive interstitial lung disease categorized as probable usual interstitial pneumonia (UIP) per current ATS guidelines. 2. Aortic atherosclerosis, in addition to left main and 2 vessel coronary artery disease. In addition, there is mild aneurysmal dilatation of the ascending thoracic aorta (4.5 cm in diameter). Ascending thoracic aortic aneurysm. Recommend semi-annual imaging followup by CTA or MRA and referral to cardiothoracic surgery if not already obtained. This recommendation follows 2010 ACCF/AHA/AATS/ACR/ASA/SCA/SCAI/SIR/STS/SVM Guidelines for the Diagnosis and Management of Patients With Thoracic Aortic Disease. Circulation. 2010; 121: Z610-R604. Aortic aneurysm NOS (ICD10-I71.9).   Aortic Atherosclerosis (ICD10-I70.0). Aortic aneurysm NOS (ICD10-I71.9).     Electronically Signed   By: Trudie Reed M.D.   On: 01/13/2020 15:15    ROS - per HPI    OV 03/01/2020  Subjective:  Patient ID: Lindley Magnus, male , DOB: December 13, 1940 , age 58  y.o. , MRN: [id number] , ADDRESS: [address] [place] [address] PCP Blair Heys, MD Patient Care Team: Blair Heys, MD as PCP - General (Family Medicine)  This Provider for this visit: Treatment Team:  Attending Provider: Kalman Shan, MD  FU IPF Associated weight loss +  pre-ofev   03/01/2020 -   Chief Complaint  Patient presents with   Follow-up    ILD, doing ok, cough has not improved     HPI Bernard G Ritacco 82 y.o. -returns for follow-up.  This is after starting nintedanib.  He is currently on 100 mg twice daily for 2 weeks.  His prescription is approved on February 02, 2020.  He accidentally got 150 mg twice daily but he sent this back.  He is going to send [place] back.  He has upcoming travel to [place] later this week he will be gone there for till February 2022.  He has supply and refill of his nintedanib on 06 March 2020.  After that he does not have nintedanib.  He and his son report that somebody will be able to go to [place] and deliver it to him.  They do not want to trust the courier service to take it there the supply chain issues.  At this point time from a respiratory standpoint he has minimal dyspnea.  He still has a cough.  The cough is not worse but after reducing the PPI dose the cough has come back.  It is somewhat troublesome.  The severity score is listed below.  They report the GI Dr. Loreta Ave has reported concern about taking very high-dose of PPI and H2 blockade which actually helped the cough.  Is because of osteoporosis risk.  I agreed with that concern.  However patient is also reporting significant symptom burden.  Primary care has suggested that they follow the symptom control and quality of life issues.  I did support the primary care viewpoint as well.  Did explain to them at the end of the day as a risk-benefit ratio and with an advanced disease like pulmonary fibrosis 1 might have to consider taking a short-term again with cough control at the expense of long-term risk of side effects.  They are going to think about this.  In terms of his travel to [place].  He put me in touch with Dr. Allena Katz.  I texted him and establish contact.  He will follow with Dr. Allena Katz who is a pulmonologist trained in [place] but now living in [place].  Gave other travel  advice are listed below.  In terms of weight loss: This is ongoing.  Even after starting the Ofev is not having any GI side effects but his weight loss continues.  He had a CT abdomen that is not showing any reason for weight loss.  Apparently had some hematuria had cystoscopy with Dr. Mena Goes.  He has some radiation seed implants.  There is no bladder cancer.  The really frustrated by this.  I did pull out an article about IPF and weight loss for the son to read.- shows IPF weight loss is asssocited with worse progrnosis    ROS - per HPI    OV 07/24/2020  Subjective:  Patient ID: Lindley Magnus, male , DOB: 02/02/1941 , age 76 y.o. , MRN: [id number] , ADDRESS: [address] [place] [address] PCP Blair Heys, MD Patient Care Team: Blair Heys, MD as PCP - General (Family Medicine)  This Provider for this visit: Treatment Team:  Attending  Provider: Kalman Shan, MD    07/24/2020 -   Chief Complaint  Patient presents with   Follow-up    4 mo f/u. States his breathing has been stable since last visit. Has noticed a decrease in his appetite. Increased diarrhea for the past week.    Idiopathic pulmonary fibrosis on nintedanib 100 mg twice daily -last CT September 2021 Weight loss unintentional even before nintedanib  HPI Klayten G Brunelli 82 y.o. -returns for follow-up.  He was in [place] and returned back in early March 2022.  He is now here with his wife.  He tells me while he was in [place] for 4 months he had a great time.  He barely had any diarrhea.  Approximately 2 months ago which is 1 month before his return he switched to an [place] version of nintedanib called NINTIB.  He was taking it for a month in [place] and did not have any problems.  Upon returning to the [place] he is starting to have diarrhea.  The diarrhea is ongoing and is 3 out of 5.  Happens 2 or 3 times a week and controlled by Imodium.  His son who is a Teacher, early years/pre thinks it is because he  is in the [place] version of nintedanib.  He says he spoke to the [place] pulmonologist and was told that exponentially there is no difference in diarrhea between the [place] version of nintedanib and the [place] version of nintedanib.  Diarrhea happens randomly does bother him.  He says his diet has not changed since returning to the [place].  He says he eats the same [place] diet.  He does take sugar with tea in the morning.  In terms of his tachycardia it still is ongoing.  He is never had echocardiogram.  In terms of weight loss he continues to lose weight.  This weight loss started even before he was on nintedanib.  It is still ongoing.  He is lost 10 pounds since June 2021.  He is worried about this.  PFT  OV 09/22/2020  Subjective:  Patient ID: Lindley Magnus, male , DOB: [birth year] , age 89 y.o. , MRN: [id number] , ADDRESS: [address] [place] [address] PCP Blair Heys, MD Patient Care Team: Blair Heys, MD as PCP - General (Family Medicine)  This Provider for this visit: Treatment Team:  Attending Provider: Kalman Shan, MD    09/22/2020 -   Chief Complaint  Patient presents with   Follow-up    2 mo f/u after PFT. States he is still struggling with diarrhea and losing weight. States his breathing has been stable since last visit.    Idiopathic pulmonary fibrosis on nintedanib 100 mg twice daily since Oct 2021   -last CT September 2021 Weight loss unintentional even before nintedanib Cough Tachycardia NOS  HPI Zaquan G Tigert 82 y.o. -returns for follow-up of all the above medical issues.  From a respiratory standpoint he continues to be stable.  He had pulmonary function test that shows continued stability/improvement.  His cough continues.  He in fact he tells me that for the last 2 or 3 years mostly when he drinks water or eats food he starts choking and coughing and clears his throat.  He has now seen ENT and the dysphagia evaluation  is set up.  He is continue to lose weight.  In fact he is lost 10 pounds of weight since starting nintedanib.  The weight loss was the even before nintedanib but has  accelerated after nintedanib.  He has diarrhea with the nintedanib.  This is on low-dose nintedanib.  Currently is on the [place] version of the nintedanib but he still losing weight and having the same GI symptoms.  In terms of his tachycardia he had echocardiogram and this is normal.  He says his wife is very concerned about his weight loss.  His pants are loose now.      ECHO MAY 2022   IMPRESSIONS     1. Left ventricular ejection fraction, by estimation, is 60 to 65%. The  left ventricle has normal function. The left ventricle has no regional  wall motion abnormalities. Left ventricular diastolic parameters are  indeterminate.   2. Right ventricular systolic function is normal. The right ventricular  size is normal. There is mildly elevated pulmonary artery systolic  pressure. The estimated right ventricular systolic pressure is 34.8 mmHg.   3. The mitral valve is normal in structure. No evidence of mitral valve  regurgitation.   4. Tricuspid valve regurgitation is mild to moderate.   5. The aortic valve is tricuspid. Aortic valve regurgitation is not  visualized. No aortic stenosis is present.   6. The inferior vena cava is normal in size with greater than 50%  respiratory variability, suggesting right atrial pressure of 3 mmHg.     OV 01/11/2021  Subjective:  Patient ID: Lindley Magnus, male , DOB: 1941/04/12 , age 49 y.o. , MRN: [id number] , ADDRESS: [address] [place] [address] PCP Blair Heys, MD Patient Care Team: Blair Heys, MD as PCP - General (Family Medicine)  This Provider for this visit: Treatment Team:  Attending Provider: Kalman Shan, MD Type of visit: Telephone/Video Circumstance: COVID-19 national emergency Identification of patient CAINAN TRULL  with 05/19/1940 and MRN [id number] - 2 person identifier Risks: Risks, benefits, limitations of telephone visit explained. Patient understood and verbalized agreement to proceed Anyone else on call: no one other than patient and MD Patient location: patient home This provider location: [address]; [place], [place], [address]   01/11/2021 -  IPF followup   Idiopathic pulmonary fibrosis on nintedanib 100 mg twice daily since Oct 2021   -last CT September 2021 Weight loss unintentional even before nintedanib Cough Tachycardia NOS  HPI Takoda G Kirkland 82 y.o. - off ofev since may 2022 Gained weight 5#/ Minimal dyspnea to no dyspnea even with exertion. [place] in interim and had good time. Did water slide. Cough is a bit worse. Othewrise well. Enegery level goodl Though cough is mild - wants something for cough relief thought says medicine from [place] helping.  Plans to go to [place] on February 21, 2021    OV 02/06/2021  Subjective:  Patient ID: Lindley Magnus, male , DOB: July 31, 1940 , age 3 y.o. , MRN: [id number] , ADDRESS: [address] [place] [address] PCP Blair Heys, MD Patient Care Team: Blair Heys, MD as PCP - General (Family Medicine)  This Provider for this visit: Treatment Team:  Attending Provider: Kalman Shan, MD    02/06/2021 -   Chief Complaint  Patient presents with   Follow-up    Esbriet follow up, cough in the morning    Idiopathic pulmonary fibrosis on nintedanib 100 mg twice daily since Oct 2021   -last CT September 2022, pft oct 2022 -> progression on CT scan  -Stop nintedanib September 2022 due to weight loss and intolerance  -Started pirfenidone October 2022 Weight loss unintentional even before nintedanib  -  Improved September/October 2022 after stopping nintedanib Cough and Dysphagia  -Normal endoscopy with Dr. Loreta Ave September 2021/October 2021  -Abnormal barium study September 2022  HPI Atreyu EMERALD GEHRES 82 y.o. -returns for follow-up.  This is a head of his trip to [place] February 21, 2021.  He will only be back after 6 months in March /April 2023.  He tells me overall he stable.  His symptom score is stable.  Very little to no dyspnea.  He continues to have dysphagia and his chronic cough.  A year ago his endoscopy was normal.  He saw ENT because of his cough they did a barium study.  As barium study was done end of September 2022 and the suggestion of cervical web on the barium study.  He asked me to call Dr. Loreta Ave his GI.  I called her.  She said she will work him in.  He might need another endoscopy to rule out cervical web.  We discussed cervical web with the help of Google images.  Currently he is only bothered by cough especially early in the morning but this cough is stable as can be seen in the symptom score.  There is no dyspnea.  He came back from [place].  He plans to go to [place] February 21, 2021 and will stay there through March April 2023.  He is agreed for a telephone visit while in [place].  He has a pulmonologist in [place].  His walking desaturation test is stable except for mild tachycardia.  No shortness of breath.  He says he had liver function test through his primary care and this was normal.  He does not want to be checked today.    OV 09/07/2021  Subjective:  Patient ID: Lindley Magnus, male , DOB: 08-07-1940 , age 48 y.o. , MRN: [id number] , ADDRESS: [address] [place] [address] PCP Blair Heys, MD Patient Care Team: Blair Heys, MD as PCP - General (Family Medicine)  This Provider for this visit: Treatment Team:  Attending Provider: Kalman Shan, MD    09/07/2021 -   Chief Complaint  Patient presents with   Follow-up    PFT performed today.      HPI Lawsen G Norville 82 y.o. -returns for follow-up.  Last seen in October 2022.  Then in November 2023 went to [place] to [place].  He returned in March 2023.  While in [place] he says  overall he did not have any problems.  He was continuing his pirfenidone.  However he lost his weight.  He did then pause his pirfenidone for 3 weeks and his weight improved.  Then after coming here he started losing weight again.  He could not figure out if it is because of dysphagia which had also started versus pirfenidone.  He then has reduced his pirfenidone to 2 pills 3 times daily.  At this point in time is not interested in clinical trials.  He says he wants to avoid many medications.  Simultaneously has been dealing with dysphagia particular with solids and spices.  Also hard food.  He is clearing his throat quite frequently although he still rates it as level 1 out of 5.  While in [place] May 04, 2021 he had PET scan.  He showed me the images of the PET scan.  We can upload the CD-ROM in the PACS system.  In the entire left lower lobe there is slightly increased uptake for 2.7.  This is consistent with the fibrotic tissue there  is also low uptake in the mediastinal adenopathy which is considered to be reactive.  He did not want any quick follow-up of his CT scan.  He is okay to have a CT scan in September 2023.  He wants avoid radiation exposure.   On August 10, 2021 at [facility] long Dr. Jacques Navy performed status post dilatation of esophageal stenosis.  I reviewed the records.  Of note: He plans to go to [place] in October 2023.  He wants his next follow-up in September 23.  In August 2023 is going with a lot of people to [place]       PFT      OV 02/25/2022  Subjective:  Patient ID: Lindley Magnus, male , DOB: 10-19-1940 , age 3 y.o. , MRN: [id number] , ADDRESS: [address] [address] PCP Blair Heys, MD Patient Care Team: Blair Heys, MD as PCP - General (Family Medicine)  This Provider for this visit: Treatment Team:  Attending Provider: Kalman Shan, MD    02/25/2022 -   Chief Complaint  Patient presents with   Follow-up    PFT performed  today.  Pt states he is slowly getting better after recent hospital stay.    HPI Jameel G Mcglone 82 y.o. -returns for follow-up.  Over the summer 2023 got hospitalized for COVID-19 related respiratory failure and IPF flareup and ARDS.  He then had a complication by aspiration for which she did not get intubated according to the son.  He then spent time in inpatient rehab and is now home for the last 3 weeks.  At home he is continue to improve.  He is on a chronic prednisone taper with Bactrim.  At this point in time he is not needing oxygen at rest based on home pulse oximetry monitoring.  Here we walked him 5 feet and back on room air and he did not desaturate below 94%.  Recent liver function test normal.  Anemia also improving.   He has had continued weight loss although he has gained weight since the hospitalization compared to prehospitalization this further weight loss to 114 pounds.  Some of this was pirfenidone mediated.  He is now back on the pirfenidone for the last few weeks.  They also feel that some of the weight loss is because he is not able to take enough food because of the dysphagia and the cough.  He continues to be physically deconditioned but even that he has improved.  He is able to do some ADLs.  He is able to take shower with the help of his wife.  He is able to walk within the house although still a little bit wobbly   He now has a chronic Foley which she is not able to get rid of after the hospitalization.  He follows with Dr. Mena Goes neurology.   The main issue right now is his cough and clearing of the throat.  The 2 are slightly unrelated.  He coughs when he eats food.  He also coughs when he clears her throat.  He throats clears his throat independent of the cough.  Eating food makes him clear the throat more. .  At night he does not clear the throat.  He has not seen ENT in 2 years.  There is no history of difficult intubation.  He and her son feel it is related to  his dysphagia issues.  Mid November 2023 he is being scheduled for esophageal dilatation by Dr. Jeani Hawking.  I did tell them that the risk for endoscopy is acceptable given the fact he was able to walk 5-10 feet without any desaturation on room air ..  I supported him having the endoscopy.  They believe there might be some sinus drainage.  Did encourage him to try over-the-counter Flonase and also Mucinex.   He did have some back pain in the hospital and was started on gabapentin.  Is clearly not helping his cough or clearing of the throat.  He is back pain is not much of an issue.  We took a shared decision making he can come off this.      04/01/2022 Patient presents today for follow-up.  She has a history of IPF recently diagnosed with community-acquired pneumonia.  Chest x-ray on 03/20/2022 showed increased left lung opacity.  He was treated with liquid Augmentin x 7 days. Cough is worse the last several days. Son reports that he has several distince coughs. No associated shortness of breath. He has new swelling in feet last 4-5 days. Constantly clearing his throat, aggressively trys to cough up mucus but cough is mainly dry. When he lays flat he does not cough.  He is compliant with Esbriet and taking prednisone every other day. No increased oxygen demands. During last office visit with Dr. Marchelle Gearing he was referred to ENT due to cough to rule out postnasal drip as cause. He has not been contacted to schedule visit.  He had an EGD in November which showed esophageal stenosis.  Takes Protonix twice daily, needs refill.       OV 06/20/2022  Subjective:  Patient ID: Lindley Magnus, male , DOB: [birth year] , age 57 y.o. , MRN: [id number] , ADDRESS: [address] [place] [address] PCP Blair Heys, MD Patient Care Team: Blair Heys, MD as PCP - General (Family Medicine) Lennette Bihari, MD as PCP - Cardiology (Cardiology)  This Provider for this visit: Treatment Team:   Attending Provider: Kalman Shan, MD   06/20/2022 -visit after exposure to flulike illness.   HPI Hailey G Carchi 82 y.o. -presents with son benign.  This is an acute visit.  He has full schedule regular visit coming up.  Some 3-4 days ago son thought he had the flu.  Initially said he had the flu but later said he did not test himself for the flu but he was flulike illness.  The daughter-in-law who also lives in the house got similar symptoms then 2 days ago patient started with body ache and cough and wet cough that is increased compared to baseline.  The sputum was clear in color and there is no fever or chills.  There is no runny nose.  Currently he is improving actually.  His cough intensity has come down his body ache is come down.  He is to do pulmonary rehabilitation and is not desaturating but he still uses his oxygen for psychological comfort.  He did tell me that when he goes upstairs he goes down to 88-89% but this is baseline.  He does not think he is desaturating currently.  We did a rapid flu test and it was negative.  He is continue to lose weight.  He had endoscopy in November 2023 for esophageal stenosis.  Frequent scheduled endoscopy at [facility] is pending by Dr. Jeani Hawking.  He is continue to lose weight.      OV 07/02/2022  Subjective:  Patient ID: Lindley Magnus, male , DOB: 01/13/1941 , age 37  y.o. , MRN: [id number] , ADDRESS: [address] [place] [address] PCP Blair Heys, MD Patient Care Team: Blair Heys, MD as PCP - General (Family Medicine) Lennette Bihari, MD as PCP - Cardiology (Cardiology)  This Provider for this visit: Treatment Team:  Attending Provider: Kalman Shan, MD   07/02/2022 -   Chief Complaint  Patient presents with   Follow-up    PFT     HPI Dane G Rhudy 82 y.o. -returns for follow-up.  I recently saw him a few weeks ago because of exposure to flulike illness.  He states he is  back to baseline.  However does appear to have lost 5 more pounds of weight in the last 2 weeks.  His son is surprised by this.  Patient does admit to low appetite.  However he is known to deal with esophageal stenosis.  The son did indicate to me at last visit and again this visit that Dr. Jeani Hawking is considering sequential endoscopy with sequential dilatation at specific interval.  Apparently this has been hard to schedule with the endoscopy suite at [facility] long.  At the request I did reach to Roselie Awkward the director for the endoscopy suite to work with Dr. Jeani Hawking to make sure this was possible because of his ongoing weight loss.  Son is also not sure whether it is accurate weight loss of 5 pounds in the last 2 weeks.  Therefore I asked them to track dry weight on a scheduled basis at a specific fix time.  We did discuss about the possibility of pirfenidone playing a role in the weight loss.  They are also having a swallowing evaluation coming up at [facility]   He did have lung function test today and overall there is a 35% decline in [facility] compared to May 2023 [his COVID was in July August 2023].  There is at least 14% progression since fall 2023.  They are surprised by the decline in PFT because they said that he is actually doing better his cough is better and his symptom scores are better.  He does have oxygen but he states that rest he does not need it.  Also nighttime he is not using it consistently and does not feel the need to.  They say when they checked the pulse ox when he is exerting or sitting still it is stable.  Overall son and he were in alignment that ever since COVID in summer 2023 his overall health status is declined. CT Chest data OV 09/12/2022  Subjective:  Patient ID: STEPHON WEATHERS, male , DOB: Jun 10, 1940 , age 61 y.o. , MRN: [id number] , ADDRESS: [address] [place] [address] PCP Blair Heys, MD Patient Care Team: Blair Heys, MD as PCP -  General (Family Medicine) Lennette Bihari, MD as PCP - Cardiology (Cardiology) Mealor, Roberts Gaudy, MD as PCP - Electrophysiology (Cardiology)  This Provider for this visit: Treatment Team:  Attending Provider: Kalman Shan, MD    09/12/2022 -   Chief Complaint  Patient presents with   Follow-up    F/up wet cough, thin and white mucous.     HPI Izzac G Higuchi 82 y.o. -returns for follow-up.  Presents with her son Mikey [employer].  He is not using oxygen anymore.  In fact he did a sit/stand test and he did not need oxygen.  Although he is sitting here with oxygen.  He feels stable he is driving car.  However he continues to lose weight  although the pace of the weight loss might of stabilized.  He continues his pirfenidone.  His main issues appear to be swallowing related.  He did have esophageal dilatation early March 2024 by Dr. Jeani Hawking.  Subsequent to that has seen ENT has had extensive evaluation at [facility].  The son showed me a video of a barium swallow test.  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  He needs to drink water to swallow the pill.  Referral has been made to [facility]..  Also in the last week he has had increased cough which appears to be wet but there is no fever or chills wheezing.  The son thinks is because he reduced his Protonix.  He wants to increase the Protonix.  If this does not work recommended a 5-day prednisone taper.  Also he plans to go to [place] in October 2024.  I cautioned him somewhat against this because of his frailty but decided will repeat pulmonary function test in September 2023 and decide.     OV 01/23/2023  Subjective:  Patient ID: Lindley Magnus, male , DOB: 1940-12-15 , age 33 y.o. , MRN: [id number] , ADDRESS: [address] [place] [address] PCP Blair Heys, MD Patient Care Team: Blair Heys, MD as PCP - General (Family Medicine) Lennette Bihari, MD as PCP - Cardiology (Cardiology) Mealor, Roberts Gaudy, MD as PCP -  Electrophysiology (Cardiology)  This Provider for this visit: Treatment Team:  Attending Provider: Kalman Shan, MD    01/23/2023 -   Chief Complaint  Patient presents with   Follow-up    HPI Praveen G Jiggetts 82 y.o. -returns for follow-up.  Presents with her son Mikey College who is an independent historian.  Several issues  #IPF: He feels he is stable.  Excess hypoxemia test today is stable .  He feels the symptoms are stable but his FVC shows a decline.  #Physical deconditioning: He is using a cane.  It appears he had a fall 1 time is the first ever fall.  There is no visible injury.  He thinks it is a mechanical fall because he was not using his cane properly.  The family is monitoring it but overall he is stronger compared to his admission last year but not made any further improvement  #Cachexia and failure to thrive and weight loss: On my scale her weight is stable.  We have assumed this was because of Esbriet but the son feels that some of weight loss.  They did indicate he has prostatic cancer  #Prostate cancer and hilar adenopathy: He had a PET scan end of August 2024 personally visualized that he has a right hilar adenopathy that is lighting up on PET scan along with pleural metastasis.  The son believes it is from his prostate cancer.  They are going to see a urologist now at [facility].  He is currently seeing Dr. Mena Goes.  I did indicate to him that if the urologist feels this requires a biopsy then we would be able to biopsy it here  #Travel advice: He desperately wants to go to [place] between November and February 2025.  His x-rays hypoxemia test is stable and although there are no contraindications I did tell him there was elevated risk.  Given the letter for oxygen support for air travel.  They are going for stat domestic air travel.   #Cough and dysphagia this is somewhat stable.  Reviewed the notes in April 2024 by GI at [facility].  He  has irritable larynx.   They might try another GI doctor.   OV 03/06/2023  Subjective:  Patient ID: Lindley Magnus, male , DOB: [birth year] , age 34 y.o. , MRN: [id number] , ADDRESS: [address] [place] [address] PCP Blair Heys, MD (Inactive) Patient Care Team: Blair Heys, MD (Inactive) as PCP - General (Family Medicine) Lennette Bihari, MD as PCP - Cardiology (Cardiology) Mealor, Roberts Gaudy, MD as PCP - Electrophysiology (Cardiology)  This Provider for this visit: Treatment Team:  Attending Provider: Kalman Shan, MD    Idiopathic pulmonary fibrosis on nintedanib 100 mg twice daily since Oct 2021   -last CT September 2022, pft oct 2022 -> progression on CT scan  -Stop nintedanib September 2022 due to weight loss and intolerance  -Started pirfenidone October 2022 -> reduced to 2 pills 3 times daily March 2023 due to weight loss  IPF flareup due to COVID-19 summer 2023 followed by aspiration pneumonia and acute respiratory failure needing oxygen and intubation  -History of intubation  - Course complicated by aspiration  -On prednisone therapy since discharge  Weight loss unintentional even before nintedanib  -Improved September/October 2022 after stopping nintedanib  -relapse while in [place] but after starting pirfenidone in the fall 2022 and early 2023.  -Progressive weight loss after summer 2023 hospitalization for COVID-19  - June 2021: 137#  - 114# - oct 2023  - 110# 06/20/2022  -105# 07/02/2022 (son surpise)d  - 104# 09/12/2022 and 01/23/2023     Cough and Dysphagia ad clearing throat  -Normal endoscopy with Dr. Loreta Ave September 2021/October 2021  -Abnormal barium study September 2022  -Status post endoscopy with esophageal stenosis dilatation by Dr. Elnoria Howard April 2023.  -Worse in October 2023 after some hospitalization for COVID  - s/p mild eso stenosis dilation 07/12/22   Normal right heart cath July 2023 without pulmonary hypertension.   Esbriet/Pirfenidone requires  intensive drug monitoring due to high concerns for Adverse effects of , including  Drug Induced Liver Injury, significant GI side effects that include but not limited to Diarrhea, Nausea, Vomiting,  and other system side effects that include Fatigue, headaches, weight loss and other side effects such as skin rash. These will be monitored with  blood work such as LFT initially once a month for 6 months and then quarterly   03/06/2023 -   Chief Complaint  Patient presents with   Follow-up    Pt states he is doing well,      HPI Kimmy G Egelhoff 82 y.o. -returns for follow-up with son Mikey [employer].  Overall he is stable.    Travel advice: This visit is for he had to [place] end of November 2024.  He will be back in March 2025.  His symptom score in excess hypoxemia test is stable.  I did advise him to get liver function test in [place].  I also did fill out a letter for him to show the airline so he can take his portable oxygen  IPF: Clinically stable.  Excess hypoxemia test shows stability  Therapeutic monitoring and pirfenidone intake: Tolerating it well.  Recent liver function test normal.  He will have liver function test in [place].  For this refill the pharmacy or the genetic team sent him capsule which she cannot swallow.  Therefore he wants a tablet.  Personally communicated this with the pharmacy team.  Weight loss: Stable   Malignant neoplasm: He has prostate cancer and there is believe that is spread to the lungs with pleural  metastasis.  This was on a PET scan.  I was concerned that this could be another primary malignancy.  Therefore advised him to discuss this with his oncologist at [facility].  According to him and his son this was discussed and no biopsy has been recommended.  He did see Dr. Lottie Dawson on 01/29/2023 and confirmed the oncologist impression that resolved prostate cancer with spread.   Dysphagia: He is now seeing another GI doctor.  He is seeing Dr. Carmine Savoy of  [facility].  Notes reviewed 02/27/2023.  He    SYMPTOM SCALE - ILD 10/05/2019 137 pounds 01/17/2020 134 pounds 03/01/2020 131# - start ofev oct 2021 07/24/2020 127# on ofev (nintib from [place] by [facility] x 2 months) 100mg  bid 09/22/2020 122# - on ofev 100mg  bid 01/11/2021 Tele visit - off ofev  128# not on any antifibrotic 02/06/2021 131# -esbriet x 1 week 09/07/2021 125#  02/25/2022 114#  06/20/2022 110# 07/02/2022 105# 09/12/2022 104# 01/23/2023  03/06/2023 104#  O2 use ra ra ra ra ra  ra   ra ra  ra  Shortness of Breath 0 -> 5 scale with 5 being worst (score 6 If unable to do)              At rest 0 0 0 0 0  0   0 0  0  Simple tasks - showers, clothes change, eating, shaving 0 0 0 1 0  0 0  1 1  0  Household (dishes, doing bed, laundry) 0 1 1  0 0  0 1.5  na na  na  Shopping 0 0 0 0   0 nan  na 1  0  Walking level at own pace 0 0 0 0 0  0 1  0 0  0  Walking up Stairs 1 0 1 0 0  0 2  1 1  1   Total (30-36) Dyspnea Score 1 1 2 1  0  0 3.5  2 3  2   How bad is your cough? 2 3 0 2 2  2 5   1.5 3  1   How bad is your fatigue 0 0 0 0 0  0 4  1.5 3  0  How bad is nausea 0 0 0 1 0  0 0  0 0  0  How bad is vomiting?  00 0 0 0 0  0 0  0 0  0  How bad is diarrhea? 0 0 0 3 3  0 0  0 0  0  How bad is anxiety? 00 1 1 1  0  1 1.5  1 1   0  How bad is depression 0 1 1 1  0  1 1.5  0 0  0         Simple office walk 185 feet x  3 laps goal with forehead probe 10/05/2019  03/01/2020  07/24/2020  09/22/2020  02/06/2021  09/07/2021  09/12/2022  01/23/2023  03/06/2023   O2 used ra ra  ra ra ra ra ra ra  Number laps completed 3   3   Sit stand x 10 resp Sit stand x 10 with cane Sit stand x10  Comments about pace avt   Mod paced avg avg steady steady   Resting Pulse Ox/HR 100% and 122/min 98% and 110 1-00% and 120 100% and 109/min 99% and 89 100% and 85 97-96% and HR 113 97% and HR 73 95% and HR 69  Final Pulse  Ox/HR 100% and 143/min 97% and 130 99% and 130 99% and 124/min 99% and 114 98%  ad 115 92-93% and HR 115 95% and HR 78 93% and HR 73  Desaturated </= 88% x x  no no  no    Desaturated <= 3% points x x  no no  yes    Got Tachycardic >/= 90/min x x  ye yes  yes    Symptoms at end of test x x  none none      Miscellaneous comments x x No dyspnea No dyspnea No dyspnea No dyspnea Mild 1 of 10 dyspnea 3 of 10 dyspnea      PFT     Latest Ref Rng & Units 01/14/2023    4:00 PM 09/02/2022    2:04 PM 07/01/2022   10:42 AM 02/25/2022    3:12 PM 09/07/2021   10:48 AM 02/05/2021   10:44 AM 09/22/2020    9:50 AM  ILD indicators  FVC-Pre L 1.78  1.88  1.68  1.96  2.63  2.67  2.81   FVC-Predicted Pre % 57  60  54  63  84  85  89   FVC-Post L   1.60       FVC-Predicted Post %   51       TLC L   3.54       TLC Predicted %   56       DLCO uncorrected ml/min/mmHg 5.40  0.44   8.46  12.24  13.45  16.91   DLCO UNC %Pred % 20  1   31   45  49  62   DLCO Corrected ml/min/mmHg 5.40  0.44   9.22  12.24  13.45  16.91   DLCO COR %Pred % 20  1   34  45  49  62       LAB RESULTS last 96 hours No results found.  LAB RESULTS last 90 days Recent Results (from the past 2160 hour(s))  Pulmonary function test     Status: None   Collection Time: 01/14/23  4:00 PM  Result Value Ref Range   FVC-Pre 1.78 L   FVC-%Pred-Pre 57 %   FEV1-Pre 1.66 L   FEV1-%Pred-Pre 71 %   FEV6-Pre 1.78 L   FEV6-%Pred-Pre 57 %   Pre FEV1/FVC ratio 93 %   FEV1FVC-%Pred-Pre 122 %   Pre FEV6/FVC Ratio 100 %   FEV6FVC-%Pred-Pre 100 %   FEF 25-75 Pre 2.29 L/sec   FEF2575-%Pred-Pre 133 %   DLCO unc 5.40 ml/min/mmHg   DLCO unc % pred 20 %   DLCO cor 5.40 ml/min/mmHg   DLCO cor % pred 20 %   DL/VA 2.53 ml/min/mmHg/L   DL/VA % pred 52 %  Hepatic function panel     Status: Abnormal   Collection Time: 01/23/23 11:37 AM  Result Value Ref Range   Total Bilirubin 0.4 0.2 - 1.2 mg/dL   Bilirubin, Direct 0.0 0.0 - 0.3 mg/dL   Alkaline Phosphatase 67 39 - 117 U/L   AST 20 0 - 37 U/L   ALT 13 0 - 53 U/L   Total  Protein 8.4 (H) 6.0 - 8.3 g/dL   Albumin 4.0 3.5 - 5.2 g/dL         has a past medical history of Back pain, Elevated PSA, Essential tremor, Hypercholesteremia, Neuropathy, Prostate cancer (HCC), and Sinus tachycardia (01/30/2018).   reports that he has never smoked. He has never used smokeless tobacco.  Past Surgical History:  Procedure Laterality Date   COLONOSCOPY WITH PROPOFOL N/A 08/10/2021   Procedure: COLONOSCOPY WITH PROPOFOL;  Surgeon: Jeani Hawking, MD;  Location: [facility]OSCOPY;  Service: Gastroenterology;  Laterality: N/A;   ESOPHAGEAL DILATION  08/10/2021   Procedure: ESOPHAGEAL DILATION;  Surgeon: Jeani Hawking, MD;  Location: [facility];  Service: Gastroenterology;;   ESOPHAGOGASTRODUODENOSCOPY (EGD) WITH PROPOFOL N/A 08/10/2021   Procedure: ESOPHAGOGASTRODUODENOSCOPY (EGD) WITH PROPOFOL;  Surgeon: Jeani Hawking, MD;  Location: [facility];  Service: Gastroenterology;  Laterality: N/A;   ESOPHAGOGASTRODUODENOSCOPY (EGD) WITH PROPOFOL N/A 03/12/2022   Procedure: ESOPHAGOGASTRODUODENOSCOPY (EGD) WITH PROPOFOL;  Surgeon: Jeani Hawking, MD;  Location: [facility];  Service: Gastroenterology;  Laterality: N/A;   ESOPHAGOGASTRODUODENOSCOPY (EGD) WITH PROPOFOL N/A 07/12/2022   Procedure: ESOPHAGOGASTRODUODENOSCOPY (EGD) WITH PROPOFOL;  Surgeon: Jeani Hawking, MD;  Location: [facility];  Service: Gastroenterology;  Laterality: N/A;   HEMOSTASIS CLIP PLACEMENT  08/10/2021   Procedure: HEMOSTASIS CLIP PLACEMENT;  Surgeon: Jeani Hawking, MD;  Location: [facility];  Service: Gastroenterology;;   LUMBAR MICRODISCECTOMY     L4-5   POLYPECTOMY  08/10/2021   Procedure: POLYPECTOMY;  Surgeon: Jeani Hawking, MD;  Location: [facility];  Service: Gastroenterology;;   RADIOACTIVE SEED IMPLANT N/A 02/24/2018   Procedure: RADIOACTIVE SEED IMPLANT/BRACHYTHERAPY IMPLANT;  Surgeon: Jerilee Field, MD;  Location: [facility];  Service: Urology;  Laterality: N/A;   RIGHT HEART  CATH N/A 11/22/2021   Procedure: RIGHT HEART CATH;  Surgeon: Lennette Bihari, MD;  Location: [facility] INVASIVE CV LAB;  Service: Cardiovascular;  Laterality: N/A;   SAVORY DILATION N/A 03/12/2022   Procedure: SAVORY DILATION;  Surgeon: Jeani Hawking, MD;  Location: [facility];  Service: Gastroenterology;  Laterality: N/A;   SAVORY DILATION N/A 07/12/2022   Procedure: SAVORY DILATION;  Surgeon: Jeani Hawking, MD;  Location: [facility];  Service: Gastroenterology;  Laterality: N/A;   SPACE OAR INSTILLATION N/A 02/24/2018   Procedure: SPACE OAR INSTILLATION;  Surgeon: Jerilee Field, MD;  Location: [facility];  Service: Urology;  Laterality: N/A;    Allergies  Allergen Reactions   Beef-Derived Products Other (See Comments)    Vegetarian   Chicken Protein Other (See Comments)    Vegetarian   Egg-Derived Products Other (See Comments)    Vegetarian   Fish-Derived Products Other (See Comments)    Vegetarian   Meat Extract Other (See Comments)    Vegetarian   Ofev [Nintedanib] Other (See Comments)    Weight loss   Pork-Derived Products Other (See Comments)    Vegetarian    Immunization History  Administered Date(s) Administered   Influenza, High Dose Seasonal PF 01/28/2019, 01/12/2021, 02/11/2022, 01/08/2023   Influenza-Unspecified 01/05/2020   PFIZER(Purple Top)SARS-COV-2 Vaccination 07/23/2019, 08/17/2019   Pneumococcal-Unspecified 02/28/2015   Tdap 01/08/2023   Zoster, Live 01/26/2021    Family History  Problem Relation Age of Onset   Other Mother        died at 42 of natural causes   Other Father        unsure of health   Cancer Neg Hx      Current Outpatient Medications:    aspirin EC 81 MG tablet, Take 81 mg by mouth daily. Swallow whole., Disp: , Rfl:    darolutamide (NUBEQA) 300 MG tablet, Take 300 mg by mouth 2 (two) times daily with a meal., Disp: , Rfl:    metoprolol succinate (TOPROL-XL) 25 MG 24 hr tablet, Take 1 tablet (25 mg total) by mouth  daily. Take with or immediately following  a meal., Disp: 90 tablet, Rfl: 3   metoprolol succinate (TOPROL-XL) 50 MG 24 hr tablet, Take 1 tablet (50 mg total) by mouth daily. Take with or immediately following a meal., Disp: 90 tablet, Rfl: 3   mirtazapine (REMERON) 15 MG tablet, Take 15 mg by mouth at bedtime., Disp: , Rfl:    Multiple Vitamin (MULTIVITAMIN WITH MINERALS) TABS tablet, Take 1 tablet by mouth daily., Disp: , Rfl:    OXYGEN, Inhale 1-2 L into the lungs continuous., Disp: , Rfl:    pantoprazole (PROTONIX) 40 MG tablet, Take 1 tablet (40 mg total) by mouth 2 (two) times daily. (Patient taking differently: Take 40 mg by mouth daily.), Disp: 30 tablet, Rfl: 2   pregabalin (LYRICA) 50 MG capsule, Take 50 mg by mouth 2 (two) times daily. Patient takes 100 mg at night and 50 mg daily, Disp: , Rfl:    rosuvastatin (CRESTOR) 10 MG tablet, TAKE ONE TABLET BY MOUTH DAILY, Disp: 90 tablet, Rfl: 3   Vitamin D, Ergocalciferol, (DRISDOL) 1.25 MG (50000 UNIT) CAPS capsule, Take 1 capsule (50,000 Units total) by mouth every 7 (seven) days., Disp: 5 capsule, Rfl: 0   furosemide (LASIX) 20 MG tablet, Take 1 tablet (20 mg total) by mouth daily as needed for edema., Disp: 30 tablet, Rfl: 1   Pirfenidone 267 MG TABS, Take 534 mg by mouth in the morning, at noon, and at bedtime. **please dispense 3 month supply of TABLETS if possible**, Disp: 540 tablet, Rfl: 1      Objective:   Vitals:   03/06/23 1328  BP: 108/67  Pulse: 67  SpO2: 96%  Weight: 104 lb (47.2 kg)  Height: 5\' 6"  (1.676 m)    Estimated body mass index is 16.79 kg/m as calculated from the following:   Height as of this encounter: 5\' 6"  (1.676 m).   Weight as of this encounter: 104 lb (47.2 kg).  @WEIGHTCHANGE @  Filed Weights   03/06/23 1328  Weight: 104 lb (47.2 kg)     Physical Exam   General: No distress. cachectic O2 at rest: no Cane present: yes Sitting in wheel chair: no Frail: YES Obese: no Neuro: Alert and  Oriented x 3. GCS 15. Speech normal Psych: Pleasant Resp:  Barrel Chest - no.  Wheeze - no, Crackles - YES, No overt respiratory distress CVS: Normal heart sounds. Murmurs - no Ext: Stigmata of Connective Tissue Disease - no HEENT: Normal upper airway. PEERL +. No post nasal drip        Assessment:       ICD-10-CM   1. IPF (idiopathic pulmonary fibrosis) (HCC)  J84.112     2. Weight loss, non-intentional  R63.4     3. Physical deconditioning  R53.81     4. Medication monitoring encounter  Z51.81     5. Travel advice encounter  Z71.84     6. 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 IPF (idiopathic pulmonary fibrosis) (HCC)  J84.112     2. Weight loss, non-intentional  R63.4     3. Physical deconditioning  R53.81     4. Medication monitoring encounter  Z51.81     5. Esophageal stenosis  K22.2     6. Chronic cough  R05.3     7. Travel advice encounter  Z71.84     8. Mediastinal adenopathy  R59.0  9. 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 History of prostate cancer  Z85.46        IPF (idiopathic pulmonary fibrosis) (HCC) Chronic respiratory failure after COVID-19 related hospitalization summer 2023   -PFT decreased 36% between MAy 2023 -> March 2024 but now 09/12/2022 - improved/stable; ->'s worsening again September 2024 but simple exercise hypoxemia test continues to be stable.    Plan -continue esbriet 2 pills three times daily with food    - sent message to pharmacy team to ensure you get the PILL and not Capulse --Monitor oxygen levels  -Goal pulse ox should be greater than 92% -LFT test in [place] in Jan 2025 on your own - spirometry and dlco in April [address]109 upon return from [place]   Weight loss, non-intentional  - losing weight again with esbriet xsince  02/06/2021 and subsequent weight loss in October 2023 to 114# following hospitalization and now 105# in March 2024 and  same in September 2024  -Weight loss could be because of potential spread of prostate cancer    Plan - check weight at home (naked weight at same time daily) -monitor for weight loss with esbriet for now -Continue high-protein diet as below   Hilar adenopathy along with pleural metastasis on PET scan August 2024 Hx of Prostate Cancer  -There is presumption that this is from prostate cancer  Plan - per your oncologist  COUGH and DYSPHAGIA and clearing throat New History of intubation - summer 2023  - - s/p dilatation by Dr Elnoria Howard April 2023 and march 2024  - [facility]NT eval by [facility] by Dr. Lurena Joiner in April 2024 diagnosis irritable larynx   Plan -per ENT and GI - -Respect your desire to see another GI doctor.   Physical deconditioning - new since summer 2023  -Much improved after hospitalization and inpatient rehab  but still deconditioned compared to baseline  Plan -Continue strengthening exercises -Continue high-protein vegetarian diet through legumes and tofu   Travel advice encounter  -No contraindication to travel to [place]  end nov 2023 by air other than overall increased risk for respiratory infections and blood clots and gaps in healthcare  Plan - Take letter for the airline requiring you to use oxygen for travel 03/06/2023   Follow-up  - April 2025 - 30 min visit after back from [place]   - symptom score and exercise hypoxemia test   FOLLOWUP Return in about 5 months (around 08/04/2023) for 30 min visit, ILD, after Cleda Daub and DLCO, with Dr Marchelle Gearing, Face to Face Visit.    SIGNATURE    Dr. Kalman Shan, M.D., F.C.C.P,  Pulmonary and Critical Care Medicine Staff Physician, [facility] Center Director - Interstitial Lung Disease  Program  Pulmonary Fibrosis [facility] at [facility] [place], [place], [address]  Pager: [phone], If no answer or between  15:00h - 7:00h: call [phone] Telephone: [phone]  5:49 PM 03/06/2023 HIGh Complexity  OFFICE   2021 E/M guidelines, first released in 2021, with minor revisions added in 2023. Must meet the requirements for 2 out of 3 dimensions to qualify.    Number and complexity of problems addressed Amount and/or complexity of data reviewed Risk of complications and/or morbidity  Severe exacerbation of chronic illness  Acute or chronic illnesses that may pose a threat to life or bodily function, e.g., multiple trauma, acute MI, pulmonary embolus, severe respiratory distress, progressive rheumatoid arthritis, psychiatric illness  with potential threat to self or others, peritonitis, acute renal failure, abrupt change in neurological status Must meet the requirements for 2 of 3 of the categories)  Category 1: Tests and documents, historian  Any combination of 3 of the following:  Assessment requiring an independent historian - son  Review of prior external note(s) from each unique source - GI And OCn  Review of results of each unique test  Ordering of each unique test - LFT    Category 2: Interpretation of tests    Independent interpretation of a test performed by another physician/other qualified health care professional (not separately reported)  Category 3: Discuss management/tests  Discussion of management or test interpretation with external physician/other qualified health care professional/appropriate source (not separately reported) - pharmacy  HIGH risk of morbidity from additional diagnostic testing or treatment Examples only:  Drug therapy requiring intensive monitoring for toxicity  Decision for elective major surgery with identified pateint or procedure risk factors  Decision regarding hospitalization or escalation of level of care  Decision for DNR or to de-escalate care   Parenteral controlled  substances

## 2023-03-10 ENCOUNTER — Telehealth: Payer: Self-pay | Admitting: Pharmacist

## 2023-03-10 NOTE — Telephone Encounter (Signed)
Contacted patient's son and advised that rx for Esbriet tablets was sent to [facility] last Thursday. He'll follow-up with pharmacy  Chesley Mires, PharmD, MPH, BCPS, CPP Clinical Pharmacist (Rheumatology and Pulmonology)

## 2023-03-10 NOTE — Telephone Encounter (Addendum)
Pt son calling in bc pt can not swallow the esbriet capsules and needs esbriet tablets, prescription has to be called in so it can be done the same day with 2 business days of shipping. Please call son once it has been sent in so he can call pharmacy to have them ship it out

## 2023-03-12 ENCOUNTER — Telehealth: Payer: Self-pay | Admitting: Internal Medicine

## 2023-03-12 NOTE — Telephone Encounter (Signed)
Patinent's son is dropping off forms that need to be signed by [facility] for his father to take his portable oxygen on the plane. Patient needs it to be signed by the 15th. It will be placed in his box.

## 2023-03-13 NOTE — Telephone Encounter (Signed)
.   Called and spoke with [name], MR will be in this afternoon and will have this signed,  Sending as a [place]

## 2023-03-13 NOTE — Telephone Encounter (Signed)
Will await forms.

## 2023-03-14 NOTE — Telephone Encounter (Signed)
I signed this.

## 2023-03-20 ENCOUNTER — Other Ambulatory Visit: Payer: Self-pay | Admitting: Cardiovascular Disease

## 2023-04-02 ENCOUNTER — Telehealth: Payer: Self-pay | Admitting: Internal Medicine

## 2023-04-02 DIAGNOSIS — U071 COVID-19: Secondary | ICD-10-CM

## 2023-04-02 MED ORDER — MOLNUPIRAVIR 200 MG PO CAPS
4.0000 | ORAL_CAPSULE | Freq: Two times a day (BID) | ORAL | 0 refills | Status: AC
Start: 2023-04-02 — End: 2023-04-07

## 2023-04-02 NOTE — Telephone Encounter (Signed)
Jeff Willis son would like RX for Paxlovid for patient. [facility]acy is [facility] [address] Jeff Willis phone number is [phone].

## 2023-04-02 NOTE — Telephone Encounter (Signed)
Sending to DOD. Can we send in paxlovid. Pt does not need a call back due to it being 2am in [place].

## 2023-04-02 NOTE — Telephone Encounter (Signed)
Symptoms started today- testes pos 2 hours ago  Son will be getting his med to him within 24 hours   Paxlovid not able to send due to lack of recent GFR testing

## 2023-04-02 NOTE — Telephone Encounter (Signed)
Jeff Willis made aware of Katie's response  Nothing further needed

## 2023-04-02 NOTE — Telephone Encounter (Signed)
Son is calling. Patient has tested positive for Covid in [place]. He would like a recommendation of what he should do. Dr.Ramaswamy stated that they could call him but forgot to give him the number while they were away on travels. Please call patient and advise on what they should do. [phone]

## 2023-04-02 NOTE — Telephone Encounter (Signed)
Will send molnupiravir. Take as prescribed. Should seen urgent/emergency care if he develops worsening respiratory symptoms or is unable to keep fluids down. Thanks.

## 2023-04-02 NOTE — Telephone Encounter (Signed)
If the patient is still in [place], I can't prescribe paxlovid there. Is he back in the states?

## 2023-06-28 ENCOUNTER — Other Ambulatory Visit: Payer: Self-pay | Admitting: Family

## 2023-07-02 ENCOUNTER — Ambulatory Visit (HOSPITAL_BASED_OUTPATIENT_CLINIC_OR_DEPARTMENT_OTHER): Payer: Medicare Other

## 2023-07-08 ENCOUNTER — Ambulatory Visit (HOSPITAL_COMMUNITY)
Admission: RE | Admit: 2023-07-08 | Discharge: 2023-07-08 | Disposition: A | Discharge status: Home / Self Care | Source: Ambulatory Visit | Attending: Family | Admitting: Family

## 2023-07-08 DIAGNOSIS — I7121 Aneurysm of the ascending aorta, without rupture: Secondary | ICD-10-CM | POA: Diagnosis present

## 2023-07-08 MED ORDER — IOHEXOL 350 MG/ML SOLN
75.0000 mL | Freq: Once | INTRAVENOUS | Status: AC | PRN
  Administered 2023-07-08: 75 mL via INTRAVENOUS

## 2023-07-14 ENCOUNTER — Telehealth: Payer: Self-pay | Admitting: Internal Medicine

## 2023-07-14 DIAGNOSIS — J84112 Idiopathic pulmonary fibrosis: Secondary | ICD-10-CM

## 2023-07-14 NOTE — Telephone Encounter (Signed)
 Patient would like to speak with someone from the pharmacy team. He can be reached at [phone]

## 2023-07-16 ENCOUNTER — Encounter: Payer: Self-pay | Admitting: Internal Medicine

## 2023-07-16 MED ORDER — PIRFENIDONE 267 MG PO TABS
ORAL_TABLET | ORAL | 0 refills | Status: DC
  Filled 2023-07-21: qty 180, 30d supply, fill #0
  Filled 2023-08-11: qty 180, 30d supply, fill #1
  Filled 2023-09-23: qty 180, 30d supply, fill #2

## 2023-07-16 NOTE — Telephone Encounter (Signed)
 Patient wants Esbriet  sent to his local pharmacy at [facility] on [address]

## 2023-07-16 NOTE — Telephone Encounter (Signed)
 Received notification from [facility] regarding a prior authorization for PIRFENIDONE. Authorization has been APPROVED from 07/16/2023 to 07/15/2024. Approval letter sent to scan center.  Patient can fill through [facility]: [phone]   Chesley Mires, PharmD, MPH, BCPS, CPP Clinical Pharmacist (Rheumatology and Pulmonology)

## 2023-07-16 NOTE — Telephone Encounter (Signed)
 Perfinadone has been Approved 07/16/2023- 07/15/2024  Case # [id number]  Her name is Jeff Willis with [facility]. Her call back is @ [phone] option [phone]

## 2023-07-16 NOTE — Telephone Encounter (Signed)
 Returned call to patient's son [employer]. Patient has one full bottle at home.  Enrolled patient into pulmonary fibrosis grant through [employer]: Award Period: 01/17/2023 - 07/15/2024 for $4000 ID: [id number] BIN: [id number] PCN: [id number] Group: [id number] For pharmacy inquiries, contact PDMI at [phone]. For patient inquiries, contact PAF at [phone].  Submitted an URGENT Prior Authorization request to [facility] for PIRFENIDONE via CoverMyMeds. Will update once we receive a response.  Key: [id number]   Rx sent to [facility] in preparation  Chesley Mires, PharmD, MPH, BCPS, CPP Clinical Pharmacist (Rheumatology and Pulmonology)

## 2023-07-21 ENCOUNTER — Other Ambulatory Visit: Payer: Self-pay

## 2023-07-21 ENCOUNTER — Other Ambulatory Visit (HOSPITAL_COMMUNITY): Payer: Self-pay

## 2023-07-21 NOTE — Progress Notes (Signed)
 Specialty Pharmacy Initial Fill Coordination Note  Jeff Willis is a 83 y.o. male contacted today regarding initial fill of specialty medication(s) Pirfenidone   Patient requested Delivery   Delivery date: 07/23/23   Verified address: [address]   [place] [address]   Medication will be filled on 07/22/23.   Patient has grant on file and is aware of $0 copayment.   **NOTE** Adding per son's request, pt CANNOT take the capsule formulation of pirfenidone. Dispense tablets ONLY

## 2023-07-22 ENCOUNTER — Other Ambulatory Visit: Payer: Self-pay

## 2023-07-22 ENCOUNTER — Other Ambulatory Visit (HOSPITAL_COMMUNITY): Payer: Self-pay

## 2023-07-24 ENCOUNTER — Encounter (HOSPITAL_BASED_OUTPATIENT_CLINIC_OR_DEPARTMENT_OTHER): Payer: Self-pay

## 2023-07-25 NOTE — Progress Notes (Signed)
 Patient no longer qualifies for [employer] PAP> Enrolled into grant for pulm fibrosis. He is to continue pirfenidone 534mg  three times daily  LFTs q 3 months  Chesley Mires, PharmD, MPH, BCPS, CPP Clinical Pharmacist (Rheumatology and Pulmonology)

## 2023-07-28 ENCOUNTER — Other Ambulatory Visit (HOSPITAL_COMMUNITY): Payer: Self-pay

## 2023-07-28 ENCOUNTER — Other Ambulatory Visit: Payer: Self-pay

## 2023-08-11 ENCOUNTER — Other Ambulatory Visit: Payer: Self-pay

## 2023-08-11 NOTE — Progress Notes (Signed)
 Specialty Pharmacy Refill Coordination Note  Jeff Willis is a 83 y.o. male contacted today regarding refills of specialty medication(s) Pirfenidone   Spoke with patient's son  Patient requested Delivery   Delivery date: 08/20/23   Verified address: [address]   [place] [address]   Medication will be filled on 04.22.25.

## 2023-09-11 ENCOUNTER — Other Ambulatory Visit: Payer: Self-pay

## 2023-09-15 ENCOUNTER — Other Ambulatory Visit: Payer: Self-pay

## 2023-09-15 DIAGNOSIS — J849 Interstitial pulmonary disease, unspecified: Secondary | ICD-10-CM

## 2023-09-17 ENCOUNTER — Other Ambulatory Visit (HOSPITAL_COMMUNITY): Payer: Self-pay

## 2023-09-18 ENCOUNTER — Ambulatory Visit (INDEPENDENT_AMBULATORY_CARE_PROVIDER_SITE_OTHER): Admitting: Internal Medicine

## 2023-09-18 ENCOUNTER — Encounter: Payer: Self-pay | Admitting: Internal Medicine

## 2023-09-18 VITALS — BP 118/68 | HR 64 | Ht 66.0 in | Wt 104.0 lb

## 2023-09-18 DIAGNOSIS — R590 Localized enlarged lymph nodes: Secondary | ICD-10-CM

## 2023-09-18 DIAGNOSIS — J849 Interstitial pulmonary disease, unspecified: Secondary | ICD-10-CM

## 2023-09-18 DIAGNOSIS — R5381 Other malaise: Secondary | ICD-10-CM

## 2023-09-18 DIAGNOSIS — C384 Malignant neoplasm of pleura: Secondary | ICD-10-CM

## 2023-09-18 DIAGNOSIS — Z5181 Encounter for therapeutic drug level monitoring: Secondary | ICD-10-CM | POA: Diagnosis not present

## 2023-09-18 DIAGNOSIS — R053 Chronic cough: Secondary | ICD-10-CM

## 2023-09-18 DIAGNOSIS — R634 Abnormal weight loss: Secondary | ICD-10-CM | POA: Diagnosis not present

## 2023-09-18 DIAGNOSIS — J84112 Idiopathic pulmonary fibrosis: Secondary | ICD-10-CM | POA: Diagnosis not present

## 2023-09-18 DIAGNOSIS — K222 Esophageal obstruction: Secondary | ICD-10-CM

## 2023-09-18 LAB — PULMONARY FUNCTION TEST
DL/VA % pred: 80 %
DL/VA: 3.48 ml/min/mmHg/L
DLCO unc % pred: 41 %
DLCO unc: 11.14 ml/min/mmHg
FEF 25-75 Pre: 4.32 L/s
FEF2575-%Pred-Pre: 256 %
FEV1-%Pred-Pre: 84 %
FEV1-Pre: 1.95 L
FEV1FVC-%Pred-Pre: 129 %
FEV6-%Pred-Pre: 65 %
FEV6-Pre: 1.97 L
FEV6FVC-%Pred-Pre: 100 %
FVC-%Pred-Pre: 64 %
FVC-Pre: 1.97 L
Pre FEV1/FVC ratio: 99 %
Pre FEV6/FVC Ratio: 100 %

## 2023-09-18 NOTE — Progress Notes (Signed)
 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   Prior cough in  2019 for several months  resolved on on some kind of powder inhaler x one week = Advair     History of Present Illness  08/04/2019  Pulmonary/ 1st office eval/Jeff Willis  Chief Complaint  Patient presents with   Pulmonary Consult    Referred by Jeff Jeff Willis. Pt c/o non prod cough and throat clearing x 6 months.   Dyspnea:  None/ no aerobics, walking neighborhoods ok  Cough: always dry/ any candy helps if keeps in mouth but esp hard candy / does not use cough drops  Sleep: rotates on side, bed is flat/ one pillow, never wakes up cough but /win 15 min of stirring recurs daily  SABA use: never, also never prednisone  Always trouble swallowing pills x lifetime  Call Jeff Jeff Willis office and ask what inhaler you received in 2019 as sample that seemed to help your cough = advair dpi Stop cetrizine, flonase   Prednisone  10 mg take  4 each am x 2 days,   2 each am x 2 days,  1 each am x 2 days and stop  Pantoprazole  (protonix ) 40 mg   Take  30-60 min before first meal of the day and Pepcid  (famotidine )  20 mg one after supper  until return to office  GERD   Gabapentin  100 mg four times daily (ok to stop when no coughing at all for a week)  Please schedule a follow up office visit in 4 weeks, sooner if needed  with all medications /inhalers/ solutions in hand so we can verify exactly what you are taking. This includes all  medications from all doctors and over the counters    09/06/2019  f/u ov/Jeff Willis re: uacs on gabapenitn 100 mg three times  wolicki had tried gabapentin  but failed to reveal that at last ov when I recommended it  Chief Complaint  Patient presents with   Follow-up    4 wk f/u. Wants to discuss switching Advair.   Dyspnea:  No  Cough: better just while on prednisone  maybe 50-80%   Sleeping: no noct cough SABA use: none 02: none  rec Gabapentin  200 mg up to 4 x daily  Pantoprazole  (protonix ) 40 mg   Take  30-60 min before first meal of the day and Pepcid  (famotidine )  20 mg one @  bedtime until return to office - this is the best way to tell whether stomach acid is contributing to your problem.   Keep the candy handy  Prednisone  10 mg  Take 4 for three days 3 for three days 2 for three days 1 for three days and stop  If not better in 2 weeks see Jeff Willis for your reflux  Please remember to go to the lab department and xray dept  for your tests - we will  call you with the results when they are available.         OV 10/05/2019  Subjective:  [name], Jeff Willis , DOB: 10/25/1940 , age 83 y.o. , MRN: [id number] , ADDRESS: [address] [place] [address]   10/05/2019 -   Chief Complaint  Patient presents with   Follow-up    Pt states he has been doing okay since last visit and denies any complaints.   -83 year old [place] Jeff Willis.  Referred for interstitial lung disease.  He is accompanied by his son Jeff Willis who is a Teacher, early years/pre.  I know Jeff Willis  from a few years ago when heused to accompany his aunt; asthma patient of mine. Jeff Willis  has has been referred by Jeff. Vernestine Willis to the ILD center.  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  Up until the pandemic used to travel quite frequently.  He is to go to [place] for 3 months.  At none of the properties he is being exposed to mold.  He developed insidious onset of chronic cough over a year ago  this has been persistent.  In the last few months several cough related treatments have been tried with some improvement.  Has been on gabapentin  but this made him sleepy and is currently stopped it.  Because of chronic cough which he only attributes his constant clearing of the throat he had a high-resolution CT scan of the chest that I personally visualized and shows probable UIP pattern and therefore he has been referred here.  In my personal visualization it might even be definite of UIP because there might be some early honeycombing there.  He denies any shortness of breath.  At rest he was found to be tachycardic but he says this can at baseline for him.  He will follow this up with his primary care physician.  Jeff Willis Integrated Comprehensive( ILD Questionnaire  Symptoms:   He denies any shortness of breath.  No episodic shortness of breath.  However on the questioning he does report mild shortness of breath climbing up stairs.  In terms of his cough it started in June 2020.  It is the same since it started maybe in the last week it is slightly better.  Is moderate in intensity.  He does clear the throat.  Mostly dry cough occasionally white sputum.  Does feel a tickle in the back of his throat.  He is not waking up in the middle of the night.  Occasionally affects his voice.  No nausea no vomiting no diarrhea no wheezing.   Past Medical History : Denies any asthma COPD or heart failure rheumatoid arthritis or scleroderma or lupus or polymyositis or Sjogren's.  Denies sleep apnea.  Denies HIV denies pulmonary hypertension.  Denies diabetes or thyroid  disease or stroke or seizures mononucleosis.  Denies any hepatitis or tuberculosis or kidney disease or pneumonia or blood clots or heart disease or pleurisy.  Does have on and off acid reflux for the last year   ROS: Positive for dry mouth for the last several months.  He also has some dysphagia for swallowing pills unclear for what duration.  He  also has acid reflux on PPI and H2 blocker.  Denies any Raynaud's or recurrent fever or weight loss.  No nausea no vomiting.   FAMILY HISTORY of LUNG DISEASE: * -Denies family history of COPD or asthma sarcoid or cystic fibrosis of hypersensitive pneumonitis or autoimmune disease.   EXPOSURE HISTORY: Denies  any Covid.  Denies smoking cigarettes.  Denies marijuana denies smoking.  Denies IV drug use denies cocaine   HOME and HOBBY DETAILS : Single-family home [place] for the last 10 years.  Age of the home is 15 years.  No dampness.  No mildew.  No humidifier use.  No CPAP use no nebulizer machine use.  He does use a steam iron but there is no mold or mildew in it.  There is no [place] inside the house no pet birds or parakeets.  No pet gerbils no feather pillows no mold in the [facility] duct no music habits.  He does do some gardening very small garden in the house limited gardening mostly just water  the garden.  No bird feather exposure no flood damage no strong mats no hot tub or Jacuzzi.  No exposure to animals at work.   OCCUPATIONAL HISTORY (122 questions) :   -Organic antigen exposure is negative inorganic exposure is negative   PULMONARY TOXICITY HISTORY (27 items):  -18-day prednisone  course that ended last week.  He did have seen implantation for prostate cancer in October 2019 these were radiation seeds.      Results for Weinand, Randon G (MRN [id number]) as of 11/15/2019 16:27  Ref. Range 09/07/2019 11:29 10/05/2019 11:25  Anti Nuclear Antibody (ANA) Latest Ref Range: NEGATIVE   POSITIVE (A)  ANA Pattern 1 Unknown  Nuclear, Speckled (A)  ANA Titer 1 Latest Units: titer  1:40 (H)  Angiotensin-Converting Enzyme Latest Ref Range: 9 - 67 U/L  34  Cyclic Citrullin Peptide Ab Latest Units: UNITS  <16  ds DNA Ab Latest Units: IU/mL  <1  Myeloperoxidase Abs Latest Units: AI  <1.0  Serine Protease 3 Latest Units: AI  <1.0  RA Latex Turbid. Latest Ref Range: <14 IU/mL  <14   IgE (Immunoglobulin E), Serum Latest Ref Range: <OR=114 kU/L 36   SSA (Ro) (ENA) Antibody, IgG Latest Ref Range: <1.0 NEG AI  <1.0 NEG  SSB (La) (ENA) Antibody, IgG Latest Ref Range: <1.0 NEG AI  <1.0 NEG  Scleroderma (Scl-70) (ENA) Antibody, IgG Latest Ref Range: <1.0 NEG AI  <1.0 NEG     Simple office walk 185 feet x  3 laps goal with forehead probe 10/05/2019   O2 used ra  Number laps completed 3  Comments about pace avt  Resting Pulse Ox/HR 100% and 122/min  Final Pulse Ox/HR 100% and 143/min  Desaturated </= 88% x  Desaturated <= 3% points x  Got Tachycardic >/= 90/min x  Symptoms at end of test x  Miscellaneous comments x    Results for Misenheimer, Trevione G (MRN [id number]) as of 10/05/2019 10:40  Ref. Range 09/07/2019 11:29  Eosinophils Absolute Latest Ref Range: 0.0 - 0.7 K/uL 0.1     High-resolution CT chest Sep 15, 2019: Personally visualized and agree with the findings below IMPRESSION: 1. Spectrum of findings compatible with basilar predominant fibrotic interstitial lung disease with suggestion of early honeycombing. 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 Am Annie Barton Crit Care Med Vol 198, Iss 5, [phone], Dec 28 2016. 2. Ectatic 4.4 cm ascending thoracic aorta. Recommend annual imaging followup by CTA or MRA. This recommendation follows 2010 ACCF/AHA/AATS/ACR/ASA/SCA/SCAI/SIR/STS/SVM Guidelines for the Diagnosis and Management of Patients with Thoracic Aortic Disease. Circulation. 2010; 121: W413-K440. Aortic aneurysm NOS (ICD10-I71.9). 3. One vessel coronary atherosclerosis. 4. Aortic Atherosclerosis (ICD10-I70.0).     Electronically Signed  By: Levell Reach M.D.   On: 09/15/2019 14:26     Results for KEVONTAE, BURGOON (MRN [id number]) as of 10/05/2019 10:40  Ref. Range 09/07/2019 11:29  Sheep Sorrel IgE Latest Units: kU/L  <0.10  Pecan/Hickory Tree IgE Latest Units: kU/L <0.10  IgE (Immunoglobulin E), Serum Latest Ref Range: <OR=114 kU/L 36  Allergen, D pternoyssinus,d7 Latest Units: kU/L <0.10  Cat Dander Latest Units: kU/L <0.10  Dog Dander Latest Units: kU/L <0.10  [place] Grass Latest Units: kU/L <0.10  Johnson Grass Latest Units: kU/L <0.10  Timothy Grass Latest Units: kU/L <0.10  Cockroach Latest Units: kU/L <0.10  Aspergillus fumigatus, m3 Latest Units: kU/L <0.10  Allergen, Comm Silver Birch, t9 Latest Units: kU/L <0.10  Allergen, Cottonwood, t14 Latest Units: kU/L <0.10  Elm IgE Latest Units: kU/L <0.10  Allergen, Mulberry, t76 Latest Units: kU/L <0.10  Allergen, Oak,t7 Latest Units: kU/L <0.10  COMMON RAGWEED (SHORT) (W1) IGE Latest Units: kU/L 0.15 (H)  Allergen, Mouse Urine Protein, e78 Latest Units: kU/L <0.10  D. farinae Latest Units: kU/L <0.10  Allergen, Cedar tree, t12 Latest Units: kU/L <0.10  Box Elder IgE Latest Units: kU/L <0.10  Rough Pigweed  IgE Latest Units: kU/L <0.10    ROS - per Jeff   OV 11/15/2019  Subjective:  [name], Jeff Willis , DOB: Sep 03, 1940 , age 51 y.o. , MRN: [id number] , ADDRESS: [address] [place] [address]   11/15/2019 -   Chief Complaint  Patient presents with   Follow-up    Pt states the cough has been better since last visit.      Jeff Willis 83 y.o. -presents with his son Jeff Willis for follow-up.  He is here to discuss test results.  In the interim he called for worsening cough.  We gave him prednisone .  He tells me the prednisone  actually made the cough worse particularly towards the end.  After that the son  doubled up his PPI and also added H2 blockade and the cough went away completely.  Patient feels completely normal that he is possible that he has interstitial lung disease.  We reviewed the scan and I visualized it with him together and his son.  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  They wanted to discuss the implications  of the scan in the setting of controlled acid reflux and controlled cough but he is largely asymptomatic at this point.  Noted his serologies were negative except for trace ANA and his overnight oxygen study was normal.     OV 01/17/2020   Subjective:  [name], Jeff Willis , DOB: 1940/09/26, age 70 y.o. years. , MRN: [id number],  ADDRESS: [address] [place] [address] PCP  Jannelle Memory, MD Providers : Treatment Team:  Attending Provider: Maire Scot, MD   Chief Complaint  Patient presents with   Follow-up    pt is here to go over ct and pft    Follow-up interstitial lung disease Follow-up associated cough partially controlled with acid reflux treatment Associated weight loss present   Jeff Jeff Willis 83 y.o. -presents with his son Jeff Willis.  In the interim he is continue to lose weight.  He has lost another 3-4 pounds since June 2021.  His clothes are much looser.  He thinks he has been steadily losing weight.  His respiratory symptoms are not any worse although he says that his cough that seem to initially improve with doubling up of acid reflux therapy seems to persist.  At last visit in September 2020 when I thought the cough resolved but they tell me the cough only partially improved.  Nevertheless it is persisting and they are worried about it.  In addition he is worried about weight loss.  He also has poor appetite.  He is not in any pulmonary fibrosis antifibrotic's.  There is no diarrhea.  Symptom score suggest some element of anxiety since his last visit.  He has had pulmonary function test and it shows his FVC is actually better but his DLCO is actually worse.  He had high-resolution CT chest that I visualized and reviewed with him.  It shows probable UIP with possible emerging honeycombing.  I agree with the radiologist.  The radiologist actually feels that it might be a little bit worse compared to few months ago.  Of note: He has  upcoming trip to [place] between March 05, 2020 and February 2022.  He wants to make sure his care is coordinated.  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 Evaluate for pulmonary fibrosis.   EXAM: CT CHEST WITHOUT CONTRAST   TECHNIQUE: Multidetector CT imaging of the chest was performed following the standard protocol without intravenous contrast. High resolution imaging of the lungs, as well as inspiratory and expiratory imaging, was performed.   COMPARISON:  Chest CT 09/15/2019.   FINDINGS: Cardiovascular: Heart size is normal. There is no significant pericardial fluid, thickening or pericardial calcification. There is aortic atherosclerosis, as well as atherosclerosis of the great vessels of the mediastinum and the coronary arteries, including calcified atherosclerotic plaque in the left main, left anterior descending and right coronary arteries. Dilatation of the ascending thoracic aorta measuring 4.5 cm in diameter.   Mediastinum/Nodes: No pathologically enlarged mediastinal or hilar lymph nodes. Please note that accurate exclusion of hilar adenopathy is limited on noncontrast CT scans. Esophagus is unremarkable in appearance. No axillary lymphadenopathy.   Lungs/Pleura: High-resolution images demonstrate widespread areas of ground-glass attenuation, septal thickening, thickening of the peribronchovascular interstitium, mild cylindrical bronchiectasis and peripheral bronchiolectasis. There is a suggestion of, but not definitive evidence of, early honeycombing in portions of the lungs. These findings do have a definitive craniocaudal gradient and appear slightly progressive compared to the prior study. Inspiratory and expiratory imaging is unremarkable. No acute consolidative airspace disease. No pleural effusions. No definite suspicious  appearing pulmonary nodules or masses are noted.   Upper Abdomen: Cortical calcification in the upper pole of the right kidney incidentally noted.   Musculoskeletal: There are no aggressive appearing lytic or blastic lesions noted in the visualized portions of the skeleton.   IMPRESSION: 1. The appearance of the lungs is compatible with progressive interstitial lung disease categorized as probable usual interstitial pneumonia (UIP) per current ATS guidelines. 2. Aortic atherosclerosis, in addition to left main and 2 vessel coronary artery disease. In addition, there is mild aneurysmal dilatation of the ascending thoracic aorta (4.5 cm in diameter). Ascending thoracic aortic aneurysm. Recommend semi-annual imaging followup by CTA or MRA and referral to cardiothoracic surgery if not already obtained. This recommendation follows 2010 ACCF/AHA/AATS/ACR/ASA/SCA/SCAI/SIR/STS/SVM Guidelines for the Diagnosis and Management of Patients With Thoracic Aortic Disease. Circulation. 2010; 121: J191-Y782. Aortic aneurysm NOS (ICD10-I71.9).   Aortic Atherosclerosis (ICD10-I70.0). Aortic aneurysm NOS (ICD10-I71.9).     Electronically Signed   By: Alexandria Angel M.D.   On: 01/13/2020 15:15  ROS - per Jeff    OV 03/01/2020  Subjective:  [name], Jeff Willis , DOB: December 25, 1940 , age 64 y.o. , MRN: [id number] , ADDRESS: [address] [place] [address] PCP Jannelle Memory, MD Patient Care Team: Jannelle Memory, MD as PCP - General (Family Medicine)  This Provider for this visit: Treatment Team:  Attending Provider: Maire Scot, MD  FU IPF Associated weight loss + pre-ofev    03/01/2020 -   Chief Complaint  Patient presents with   Follow-up    ILD, doing ok, cough has not improved     Jeff Jeff Willis 83 y.o. -returns for follow-up.  This is after starting nintedanib .  He is currently on 100 mg twice daily for 2 weeks.  His prescription is  approved on February 02, 2020.  He accidentally got 150 mg twice daily but he sent this back.  He is going to send us  back.  He has upcoming travel to [place] later this week he will be gone there for till February 2022.  He has supply and refill of his nintedanib  on 06 March 2020.  After that he does not have nintedanib .  He and his son report that somebody will be able to go to [place] and deliver it to him.  They do not want to trust the courier service to take it there the supply chain issues.  At this point time from a respiratory standpoint he has minimal dyspnea.  He still has a cough.  The cough is not worse but after reducing the PPI dose the cough has come back.  It is somewhat troublesome.  The severity score is listed below.  They report the GI Jeff. Tova Willis has reported concern about taking very high-dose of PPI and H2 blockade which actually helped the cough.  Is because of osteoporosis risk.  I agreed with that concern.  However patient is also reporting significant symptom burden.  Primary care has suggested that they follow the symptom control and quality of life issues.  I did support the primary care viewpoint as well.  Did explain to them at the end of the day as a risk-benefit ratio and with an advanced disease like pulmonary fibrosis 1 might have to consider taking a short-term again with cough control at the expense of long-term risk of side effects.  They are going to think about this.  In terms of his travel to [place].  He put me in touch with Jeff. Lydia Sams.  I texted him and establish contact.  He will follow with Jeff. Lydia Sams who is a pulmonologist trained in [place] but now living in [place].  Gave other travel advice are listed below.  In terms of weight loss: This is ongoing.  Even after starting the Ofev  is not having any GI side effects but his weight loss continues.  He had a CT abdomen that is not showing any reason for weight loss.  Apparently had some hematuria had cystoscopy with Jeff. Derrick Fling.  He  has some radiation seed implants.  There is no bladder cancer.  The really frustrated by this.  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   Prior cough in  2019 for several months  resolved on on some kind of powder inhaler x one week = Advair     History of Present Illness  08/04/2019  Pulmonary/ 1st office eval/Jeff Willis  Chief Complaint  Patient presents with   Pulmonary Consult    Referred by Jeff Jeff Willis. Pt c/o non prod cough and throat clearing x 6 months.   Dyspnea:  None/ no aerobics, walking neighborhoods ok  Cough: always dry/ any candy helps if keeps in mouth but esp hard candy / does not use cough drops  Sleep: rotates on side, bed is flat/ one pillow, never wakes up cough but /win 15 min of stirring recurs daily  SABA use: never, also never prednisone  Call Jeff Jeff Willis office and ask what inhaler you received in 2019 as sample that seemed to help your cough = advair dpi Stop cetrizine, flonase   Prednisone  10 mg take  4 each am x 2 days,   2 each am x 2 days,  1 each am x 2 days and stop  Pantoprazole  (protonix ) 40 mg   Take  30-60 min before first meal of the day and Pepcid  (famotidine )  20 mg one after supper  until return to office  GERD   Gabapentin  100 mg  four times daily (ok to stop when no coughing at all for a week)  Please schedule a follow up office visit in 4 weeks, sooner if needed  with all medications /inhalers/ solutions in hand so we can verify exactly what you are taking. This includes all medications from all doctors and over the counters    09/06/2019  f/u ov/Jeff Willis re: uacs on gabapenitn 100 mg three times  wolicki had tried gabapentin  but failed to reveal that at last ov when I recommended it  Chief Complaint  Patient presents with   Follow-up    4 wk f/u. Wants to discuss switching Advair.   Dyspnea:  No  Cough: better just while on prednisone  maybe 50-80%   Sleeping: no noct cough SABA use: none 02: none  rec Gabapentin  200 mg up to 4 x daily  Pantoprazole  (protonix ) 40 mg   Take  30-60 min before first meal of the day and Pepcid  (famotidine )  20 mg one @  bedtime until return to office - this is the best way to tell whether stomach acid is contributing to your problem.   Keep the candy handy  Prednisone  10 mg  Take 4 for three days 3 for three days 2 for three days 1 for three days and stop  If not better in 2 weeks see Jeff Willis for your reflux  Please remember to go to the lab department and xray dept  for your tests - we will call you with the results when they are available.         OV 10/05/2019  Subjective:  [name], Jeff Willis , DOB: [birth year] ,  age 65 y.o. , MRN: [id number] , ADDRESS: [address] Jeff [place] [address]   10/05/2019 -   Chief Complaint  Patient presents with   Follow-up    Pt states he has been doing okay since last visit and denies any complaints.   -83 year old [place] Jeff Willis.  Referred for interstitial lung disease.  He is accompanied by his son Jeff Willis who is a Teacher, early years/pre.  I know Jeff Willis  from a few years ago when heused to accompany his aunt; asthma patient of mine. Avante ZAYDRIAN BATTA  has has been referred by Jeff. Vernestine Willis to the ILD center.  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  Up until the pandemic used to travel quite frequently.  He is to go to [place] for 3 months.  At none of the properties he is being exposed to mold.  He developed insidious onset of chronic cough over a year ago this has been persistent.  In the last few months several cough related treatments have been tried with some improvement.  Has been on gabapentin  but this made him sleepy and is currently stopped it.  Because of chronic cough which he only attributes his constant clearing of the throat he had a high-resolution CT scan of the chest that I personally visualized and shows probable UIP pattern and therefore he has been referred here.  In my personal visualization it might even be definite of UIP because there might be some early honeycombing there.  He denies any shortness of breath.  At rest he was found to be tachycardic but he says this can at baseline for him.  He will follow this up with his primary care physician.  [place] Integrated Comprehensive( ILD Questionnaire  Symptoms:   He denies any shortness of breath.  No episodic shortness of breath.  However on the questioning he does report mild shortness of breath climbing up stairs.  In terms of his cough it started in June 2020.  It is the same since it started maybe in the last week it is slightly better.  Is moderate in intensity.  He does clear the throat.  Mostly dry cough occasionally white sputum.  Does feel a tickle in the back of his throat.  He is not waking up in the middle of the night.  Occasionally affects his voice.  No nausea no vomiting no diarrhea no wheezing.     Past Medical History : Denies any asthma COPD or heart failure rheumatoid arthritis or scleroderma or lupus or polymyositis or Sjogren's.  Denies sleep apnea.  Denies HIV denies pulmonary hypertension.  Denies diabetes or thyroid  disease or stroke or seizures mononucleosis.  Denies any hepatitis or tuberculosis or kidney  disease or pneumonia or blood clots or heart disease or pleurisy.  Does have on and off acid reflux for the last year   ROS: Positive for dry mouth for the last several months.  He also has some dysphagia for swallowing pills unclear for what duration.  He also has acid reflux on PPI and H2 blocker.  Denies any Raynaud's or recurrent fever or weight loss.  No nausea no vomiting.   FAMILY HISTORY of LUNG DISEASE: * -Denies family history of COPD or asthma sarcoid or cystic fibrosis of hypersensitive pneumonitis or autoimmune disease.   EXPOSURE HISTORY: Denies any Covid.  Denies smoking cigarettes.  Denies marijuana denies smoking.  Denies IV drug use denies cocaine   HOME and HOBBY DETAILS : Single-family home [place]  [place] for the last 10 years.  Age of the home is 15 years.  No dampness.  No mildew.  No humidifier use.  No CPAP use no nebulizer machine use.  He does use a steam iron but there is no mold or mildew in it.  There is no [place] inside the house no pet birds or parakeets.  No pet gerbils no feather pillows no mold in the [facility] duct no music habits.  He does do some gardening very small garden in the house limited gardening mostly just water  the garden.  No bird feather exposure no flood damage no strong mats no hot tub or Jacuzzi.  No exposure to animals at work.   OCCUPATIONAL HISTORY (122 questions) :   -Organic antigen exposure is negative inorganic exposure is negative   PULMONARY TOXICITY HISTORY (27 items):  -18-day prednisone  course that ended last week.  He did have seen implantation for prostate cancer in October 2019 these were radiation seeds.      Results for Elmendorf, Baylen G (MRN [id number]) as of 11/15/2019 16:27  Ref. Range 09/07/2019 11:29 10/05/2019 11:25  Anti Nuclear Antibody (ANA) Latest Ref Range: NEGATIVE   POSITIVE (A)  ANA Pattern 1 Unknown  Nuclear, Speckled (A)  ANA Titer 1 Latest Units: titer  1:40 (H)  Angiotensin-Converting Enzyme  Latest Ref Range: 9 - 67 U/L  34  Cyclic Citrullin Peptide Ab Latest Units: UNITS  <16  ds DNA Ab Latest Units: IU/mL  <1  Myeloperoxidase Abs Latest Units: AI  <1.0  Serine Protease 3 Latest Units: AI  <1.0  RA Latex Turbid. Latest Ref Range: <14 IU/mL  <14  IgE (Immunoglobulin E), Serum Latest Ref Range: <OR=114 kU/L 36   SSA (Ro) (ENA) Antibody, IgG Latest Ref Range: <1.0 NEG AI  <1.0 NEG  SSB (La) (ENA) Antibody, IgG Latest Ref Range: <1.0 NEG AI  <1.0 NEG  Scleroderma (Scl-70) (ENA) Antibody, IgG Latest Ref Range: <1.0 NEG AI  <1.0 NEG     Simple office walk 185 feet x  3 laps goal with forehead probe 10/05/2019   O2 used ra  Number laps completed 3  Comments about pace avt  Resting Pulse Ox/HR 100% and 122/min  Final Pulse Ox/HR 100% and 143/min  Desaturated </= 88% x  Desaturated <= 3% points x  Got Tachycardic >/= 90/min x  Symptoms at end of test x  Miscellaneous comments x    Results for Lorentz, Wendle G (MRN [id number]) as of 10/05/2019 10:40  Ref. Range 09/07/2019 11:29  Eosinophils Absolute Latest Ref Range: 0.0 - 0.7 K/uL 0.1     High-resolution CT chest Sep 15, 2019: Personally visualized and agree with the findings below IMPRESSION: 1. Spectrum of findings compatible with basilar predominant fibrotic interstitial lung disease with suggestion of early honeycombing. 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 Am Annie Barton Crit Care Med Vol 198, Iss 5, [phone], Dec 28 2016. 2. Ectatic 4.4 cm ascending thoracic aorta. Recommend annual imaging followup by CTA or MRA. This recommendation follows 2010 ACCF/AHA/AATS/ACR/ASA/SCA/SCAI/SIR/STS/SVM Guidelines for the Diagnosis and Management of Patients with Thoracic Aortic Disease. Circulation. 2010; 121: H086-V784. Aortic aneurysm NOS (ICD10-I71.9). 3. One vessel coronary  atherosclerosis. 4. Aortic Atherosclerosis (ICD10-I70.0).     Electronically Signed   By: Levell Reach M.D.   On: 09/15/2019 14:26     Results for ZANDYR, BARNHILL (MRN [id number]) as of 10/05/2019 10:40  Ref.  Range 09/07/2019 11:29  Sheep Sorrel IgE Latest Units: kU/L <0.10  Pecan/Hickory Tree IgE Latest Units: kU/L <0.10  IgE (Immunoglobulin E), Serum Latest Ref Range: <OR=114 kU/L 36  Allergen, D pternoyssinus,d7 Latest Units: kU/L <0.10  Cat Dander Latest Units: kU/L <0.10  Dog Dander Latest Units: kU/L <0.10  [place] Grass Latest Units: kU/L <0.10  Johnson Grass Latest Units: kU/L <0.10  Timothy Grass Latest Units: kU/L <0.10  Cockroach Latest Units: kU/L <0.10  Aspergillus fumigatus, m3 Latest Units: kU/L <0.10  Allergen, Comm Silver Birch, t9 Latest Units: kU/L <0.10  Allergen, Cottonwood, t14 Latest Units: kU/L <0.10  Elm IgE Latest Units: kU/L <0.10  Allergen, Mulberry, t76 Latest Units: kU/L <0.10  Allergen, Oak,t7 Latest Units: kU/L <0.10  COMMON RAGWEED (SHORT) (W1) IGE Latest Units: kU/L 0.15 (H)  Allergen, Mouse Urine Protein, e78 Latest Units: kU/L <0.10  D. farinae Latest Units: kU/L <0.10  Allergen, Cedar tree, t12 Latest Units: kU/L <0.10  Box Elder IgE Latest Units: kU/L <0.10  Rough Pigweed  IgE Latest Units: kU/L <0.10    ROS - per Jeff   OV 11/15/2019  Subjective:  [name], Jeff Willis , DOB: 1940/06/05 , age 56 y.o. , MRN: [id number] , ADDRESS: [address] Jeff [place] [address]   11/15/2019 -   Chief Complaint  Patient presents with   Follow-up    Pt states the cough has been better since last visit.      Jeff Aydeen G Suh 83 y.o. -presents with his son Jeff Willis for follow-up.  He is here to discuss test results.  In the interim he called for worsening cough.  We gave him prednisone .  He tells me the prednisone  actually made the cough worse particularly towards the end.  After that the son  doubled up his PPI and  also added H2 blockade and the cough went away completely.  Patient feels completely normal that he is possible that he has interstitial lung disease.  We reviewed the scan and I visualized it with him together and his son.  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  They wanted to discuss the implications of the scan in the setting of controlled acid reflux and controlled cough but he is largely asymptomatic at this point.  Noted his serologies were negative except for trace ANA and his overnight oxygen study was normal.     OV 01/17/2020   Subjective:  [name], Jeff Willis , DOB: 1940/08/01, age 76 y.o. years. , MRN: [id number],  ADDRESS: [address] [place] [address] PCP  Jannelle Memory, MD Providers : Treatment Team:  Attending Provider: Maire Scot, MD   Chief Complaint  Patient presents with   Follow-up    pt is here to go over ct and pft    Follow-up interstitial lung disease Follow-up associated cough partially controlled with acid reflux treatment Associated weight loss present   Jeff Jeff Willis 83 y.o. -presents with his son Jeff Willis.  In the interim he is continue to lose weight.  He has lost another 3-4 pounds since June 2021.  His clothes are much looser.  He thinks he has been steadily losing weight.  His respiratory symptoms are not any worse although he says that his cough that seem to initially improve with doubling up of acid reflux therapy seems to persist.  At last visit in September 2020 when I thought the cough resolved but they tell me the cough only partially improved.  Nevertheless it is persisting and  they are worried about it.  In addition he is worried about weight loss.  He also has poor appetite.  He is not in any pulmonary fibrosis antifibrotic's.  There is no diarrhea.  Symptom score suggest some element of anxiety since his last visit.  He has had pulmonary function test and it shows his FVC is actually better but his DLCO is actually  worse.  He had high-resolution CT chest that I visualized and reviewed with him.  It shows probable UIP with possible emerging honeycombing.  I agree with the radiologist.  The radiologist actually feels that it might be a little bit worse compared to few months ago.  Of note: He has upcoming trip to [place] between March 05, 2020 and February 2022.  He wants to make sure his care is coordinated.  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 Evaluate for pulmonary fibrosis.   EXAM: CT CHEST WITHOUT CONTRAST   TECHNIQUE: Multidetector CT imaging of the chest was performed following the standard protocol without intravenous contrast. High resolution imaging of the lungs, as well as inspiratory and expiratory imaging, was performed.   COMPARISON:  Chest CT 09/15/2019.   FINDINGS: Cardiovascular: Heart size is normal. There is no significant pericardial fluid, thickening or pericardial calcification. There is aortic atherosclerosis, as well as atherosclerosis of the great vessels of the mediastinum and the coronary arteries, including calcified atherosclerotic plaque in the left main, left anterior descending and right coronary arteries. Dilatation of the ascending thoracic aorta measuring 4.5 cm in diameter.   Mediastinum/Nodes: No pathologically enlarged mediastinal or hilar lymph nodes. Please note that accurate exclusion of hilar adenopathy is limited on noncontrast CT scans. Esophagus is unremarkable in appearance. No axillary lymphadenopathy.   Lungs/Pleura: High-resolution images demonstrate widespread areas of ground-glass attenuation, septal thickening, thickening of the peribronchovascular interstitium, mild cylindrical bronchiectasis and peripheral bronchiolectasis. There is a suggestion of, but not definitive evidence of, early honeycombing  in portions of the lungs. These findings do have a definitive craniocaudal gradient and appear slightly progressive compared to the prior study. Inspiratory and expiratory imaging is unremarkable. No acute consolidative airspace disease. No pleural effusions. No definite suspicious appearing pulmonary nodules or masses are noted.   Upper Abdomen: Cortical calcification in the upper pole of the right kidney incidentally noted.   Musculoskeletal: There are no aggressive appearing lytic or blastic lesions noted in the visualized portions of the skeleton.   IMPRESSION: 1. The appearance of the lungs is compatible with progressive interstitial lung disease categorized as probable usual interstitial pneumonia (UIP) per current ATS guidelines. 2. Aortic atherosclerosis, in addition to left main and 2 vessel coronary artery disease. In addition, there is mild aneurysmal dilatation of the ascending thoracic aorta (4.5 cm in diameter). Ascending thoracic aortic aneurysm. Recommend semi-annual imaging followup by CTA or MRA and referral to cardiothoracic surgery if not already obtained. This recommendation follows 2010 ACCF/AHA/AATS/ACR/ASA/SCA/SCAI/SIR/STS/SVM Guidelines for the Diagnosis and Management of Patients With Thoracic Aortic Disease. Circulation. 2010; 121: Z610-R604. Aortic aneurysm NOS (ICD10-I71.9).   Aortic Atherosclerosis (ICD10-I70.0). Aortic aneurysm NOS (ICD10-I71.9).     Electronically Signed   By: Alexandria Angel M.D.   On: 01/13/2020 15:15    ROS - per Jeff    OV 03/01/2020  Subjective:  [name], Jeff Willis , DOB: 12/13/1940 , age 41 y.o. , MRN:  [id number] , ADDRESS: [address] [place] [address] PCP Jannelle Memory, MD Patient Care Team: Jannelle Memory, MD as PCP - General (Family Medicine)  This Provider for this visit: Treatment Team:  Attending Provider: Maire Scot, MD  FU IPF Associated weight loss +  pre-ofev    03/01/2020 -   Chief Complaint  Patient presents with   Follow-up    ILD, doing ok, cough has not improved     Jeff Azekiel G Clayborn 83 y.o. -returns for follow-up.  This is after starting nintedanib .  He is currently on 100 mg twice daily for 2 weeks.  His prescription is approved on February 02, 2020.  He accidentally got 150 mg twice daily but he sent this back.  He is going to send us  back.  He has upcoming travel to [place] later this week he will be gone there for till February 2022.  He has supply and refill of his nintedanib  on 06 March 2020.  After that he does not have nintedanib .  He and his son report that somebody will be able to go to [place] and deliver it to him.  They do not want to trust the courier service to take it there the supply chain issues.  At this point time from a respiratory standpoint he has minimal dyspnea.  He still has a cough.  The cough is not worse but after reducing the PPI dose the cough has come back.  It is somewhat troublesome.  The severity score is listed below.  They report the GI Jeff. Tova Willis has reported concern about taking very high-dose of PPI and H2 blockade which actually helped the cough.  Is because of osteoporosis risk.  I agreed with that concern.  However patient is also reporting significant symptom burden.  Primary care has suggested that they follow the symptom control and quality of life issues.  I did support the primary care viewpoint as well.  Did explain to them at the end of the day as a risk-benefit ratio and with an advanced disease like pulmonary fibrosis 1 might have to consider taking a short-term again with cough control at the expense of long-term risk of side effects.  They are going to think about this.  In terms of his travel to [place].  He put me in touch with Jeff. Lydia Sams.  I texted him and establish contact.  He will follow with Jeff. Lydia Sams who is a pulmonologist trained in [place] but now living in [place].  Gave other travel  advice are listed below.  In terms of weight loss: This is ongoing.  Even after starting the Ofev  is not having any GI side effects but his weight loss continues.  He had a CT abdomen that is not showing any reason for weight loss.  Apparently had some hematuria had cystoscopy with Jeff. Derrick Fling.  He has some radiation seed implants.  There is no bladder cancer.  The really frustrated by this.  I did pull out an article about IPF and weight loss for the son to read.- shows IPF weight loss is asssocited with worse progrnosis    ROS - per Jeff    OV 07/24/2020  Subjective:  [name], Jeff Willis , DOB: 1941/04/24 , age 43 y.o. , MRN: [id number] , ADDRESS: [address] [place] [address] PCP Jannelle Memory, MD Patient Care Team: Jannelle Memory, MD as PCP - General (Family Medicine)  This Provider for this visit: Treatment Team:  Attending Provider: Maire Scot,  MD    07/24/2020 -   Chief Complaint  Patient presents with   Follow-up    4 mo f/u. States his breathing has been stable since last visit. Has noticed a decrease in his appetite. Increased diarrhea for the past week.    Idiopathic pulmonary fibrosis on nintedanib  100 mg twice daily -last CT September 2021 Weight loss unintentional even before nintedanib   Jeff Jeff Willis 83 y.o. -returns for follow-up.  He was in [place] and returned back in early March 2022.  He is now here with his wife.  He tells me while he was in [place] for 4 months he had a great time.  He barely had any diarrhea.  Approximately 2 months ago which is 1 month before his return he switched to an [place] version of nintedanib  called NINTIB.  He was taking it for a month in [place] and did not have any problems.  Upon returning to [place]  he is starting to have diarrhea.  The diarrhea is ongoing and is 3 out of 5.  Happens 2 or 3 times a week and controlled by Imodium.  His son who is a Teacher, early years/pre thinks it is because he  is in the [place] version of nintedanib .  He says he spoke to the [place] pulmonologist and was told that exponentially there is no difference in diarrhea between the [place]  version of nintedanib  and the [place] version of nintedanib .  Diarrhea happens randomly does bother him.  He says his diet has not changed since returning to [place] .  He says he eats the same [place] diet.  He does take sugar with tea in the morning.  In terms of his tachycardia it still is ongoing.  He is never had echocardiogram.  In terms of weight loss he continues to lose weight.  This weight loss started even before he was on nintedanib .  It is still ongoing.  He is lost 10 pounds since June 2021.  He is worried about this.  PFT  OV 09/22/2020  Subjective:  [name], Jeff Willis , DOB: 1940/12/01 , age 50 y.o. , MRN: [id number] , ADDRESS: [address] [place] [address] PCP Jannelle Memory, MD Patient Care Team: Jannelle Memory, MD as PCP - General (Family Medicine)  This Provider for this visit: Treatment Team:  Attending Provider: Maire Scot, MD    09/22/2020 -   Chief Complaint  Patient presents with   Follow-up    2 mo f/u after PFT. States he is still struggling with diarrhea and losing weight. States his breathing has been stable since last visit.    Idiopathic pulmonary fibrosis on nintedanib  100 mg twice daily since Oct 2021   -last CT September 2021 Weight loss unintentional even before nintedanib  Cough Tachycardia NOS  Jeff Jeff Willis 83 y.o. -returns for follow-up of all the above medical issues.  From a respiratory standpoint he continues to be stable.  He had pulmonary function test that shows continued stability/improvement.  His cough continues.  He in fact he tells me that for the last 2 or 3 years mostly when he drinks water  or eats food he starts choking and coughing and clears his throat.  He has now seen ENT and the dysphagia evaluation  is set up.  He is continue to lose weight.  In fact he is lost 10 pounds of weight since starting nintedanib .  The weight loss was the even before nintedanib  but has accelerated after nintedanib .  He has diarrhea with the nintedanib .  This is on low-dose nintedanib .  Currently is on the [place]  version of the nintedanib  but he still losing weight and having the same GI symptoms.  In terms of his tachycardia he had echocardiogram and this is normal.  He says his wife is very concerned about his weight loss.  His pants are loose now.      ECHO MAY 2022   IMPRESSIONS     1. Left ventricular ejection fraction, by estimation, is 60 to 65%. The  left ventricle has normal function. The left ventricle has no regional  wall motion abnormalities. Left ventricular diastolic parameters are  indeterminate.   2. Right ventricular systolic function is normal. The right ventricular  size is normal. There is mildly elevated pulmonary artery systolic  pressure. The estimated right ventricular systolic pressure is 34.8 mmHg.   3. The mitral valve is normal in structure. No evidence of mitral valve  regurgitation.   4. Tricuspid valve regurgitation is mild to moderate.   5. The aortic valve is tricuspid. Aortic valve regurgitation is not  visualized. No aortic stenosis is present.   6. The inferior vena cava is normal in size with greater than 50%  respiratory variability, suggesting right atrial pressure of 3 mmHg.     OV 01/11/2021  Subjective:  [name], Jeff Willis , DOB: 04/30/1940 , age 46 y.o. , MRN: [id number] , ADDRESS: [address] [place] [address] PCP Jannelle Memory, MD Patient Care Team: Jannelle Memory, MD as PCP - General (Family Medicine)  This Provider for this visit: Treatment Team:  Attending Provider: Maire Scot, MD Type of visit: Telephone/Video Circumstance: COVID-19 national emergency Identification of patient Jeff Willis  with 1941-03-10 and MRN [id number] - 2 person identifier Risks: Risks, benefits, limitations of telephone visit explained. Patient understood and verbalized agreement to proceed Anyone else on call: no one other than patient and MD Patient location: patient home This provider location: [address]; [place], [place], [address]   01/11/2021 -  IPF followup   Idiopathic pulmonary fibrosis on nintedanib  100 mg twice daily since Oct 2021   -last CT September 2021 Weight loss unintentional even before nintedanib  Cough Tachycardia NOS  Jeff Jeff Willis 83 y.o. - off ofev  since may 2022 Gained weight 5#/ Minimal dyspnea to no dyspnea even with exertion. [place] in interim and had good time. Did water  slide. Cough is a bit worse. Othewrise well. Enegery level goodl Though cough is mild - wants something for cough relief thought says medicine from [place] helping.  Plans to go to [place] on February 21, 2021    OV 02/06/2021  Subjective:  [name], Jeff Willis , DOB: [birth year] , age 24 y.o. , MRN: [id number] , ADDRESS: [address] [place] [address] PCP Jannelle Memory, MD Patient Care Team: Jannelle Memory, MD as PCP - General (Family Medicine)  This Provider for this visit: Treatment Team:  Attending Provider: Maire Scot, MD    02/06/2021 -   Chief Complaint  Patient presents with   Follow-up    Esbriet  follow up, cough in the morning    Idiopathic pulmonary fibrosis on nintedanib  100 mg twice daily since Oct 2021   -last CT September 2022, pft oct 2022 -> progression on CT scan  -Stop nintedanib  September 2022 due to weight loss and intolerance  -Started pirfenidone  October 2022 Weight loss unintentional even before nintedanib   -Improved September/October 2022 after  stopping nintedanib  Cough and Dysphagia  -Normal endoscopy with Jeff. Tova Willis September 2021/October 2021  -Abnormal barium study September 2022  Jeff Jeff WESTYN DRIGGERS 83 y.o. -returns for follow-up.  This is a head of his trip to [place] February 21, 2021.  He will only be back after 6 months in March /April 2023.  He tells me overall he stable.  His symptom score is stable.  Very little to no dyspnea.  He continues to have dysphagia and his chronic cough.  A year ago his endoscopy was normal.  He saw ENT because of his cough they did a barium study.  As barium study was done end of September 2022 and the suggestion of cervical web on the barium study.  He asked me to call Jeff. Tova Willis his GI.  I called her.  She said she will work him in.  He might need another endoscopy to rule out cervical web.  We discussed cervical web with the help of Google images.  Currently he is only bothered by cough especially early in the morning but this cough is stable as can be seen in the symptom score.  There is no dyspnea.  He came back from [place].  He plans to go to [place] February 21, 2021 and will stay there through March April 2023.  He is agreed for a telephone visit while in [place].  He has a pulmonologist in [place].  His walking desaturation test is stable except for mild tachycardia.  No shortness of breath.  He says he had liver function test through his primary care and this was normal.  He does not want to be checked today.    OV 09/07/2021  Subjective:  [name], Jeff Willis , DOB: 02-15-1941 , age 75 y.o. , MRN: [id number] , ADDRESS: [address] [place] [address] PCP Jannelle Memory, MD Patient Care Team: Jannelle Memory, MD as PCP - General (Family Medicine)  This Provider for this visit: Treatment Team:  Attending Provider: Maire Scot, MD    09/07/2021 -   Chief Complaint  Patient presents with   Follow-up    PFT performed today.      Jeff Jock G Porreca 83 y.o. -returns for follow-up.  Last seen in October 2022.  Then in November 2023 went to [place] to [place].  He returned in March 2023.  While in [place] he says  overall he did not have any problems.  He was continuing his pirfenidone .  However he lost his weight.  He did then pause his pirfenidone  for 3 weeks and his weight improved.  Then after coming here he started losing weight again.  He could not figure out if it is because of dysphagia which had also started versus pirfenidone .  He then has reduced his pirfenidone  to 2 pills 3 times daily.  At this point in time is not interested in clinical trials.  He says he wants to avoid many medications.  Simultaneously has been dealing with dysphagia particular with solids and spices.  Also hard food.  He is clearing his throat quite frequently although he still rates it as level 1 out of 5.  While in [place] May 04, 2021 he had PET scan.  He showed me the images of the PET scan.  We can upload the CD-ROM in the PACS system.  In the entire left lower lobe there is slightly increased uptake for 2.7.  This is consistent with the fibrotic tissue there is also low uptake  in the mediastinal adenopathy which is considered to be reactive.  He did not want any quick follow-up of his CT scan.  He is okay to have a CT scan in September 2023.  He wants avoid radiation exposure.   On August 10, 2021 at [facility] Jeff. Merlin Starks performed status post dilatation of esophageal stenosis.  I reviewed the records.  Of note: He plans to go to [place] in October 2023.  He wants his next follow-up in September 23.  In August 2023 is going with a lot of people to [place]        PFT      OV 02/25/2022  Subjective:  [name], Jeff Willis , DOB: [birth year] , age 69 y.o. , MRN: [id number] , ADDRESS: [address] [place] [address] PCP Jannelle Memory, MD Patient Care Team: Jannelle Memory, MD as PCP - General (Family Medicine)  This Provider for this visit: Treatment Team:  Attending Provider: Maire Scot, MD    02/25/2022 -   Chief Complaint  Patient presents with   Follow-up    PFT performed  today.  Pt states he is slowly getting better after recent hospital stay.    Jeff Brandol G Attia 83 y.o. -returns for follow-up.  Over the summer 2023 got hospitalized for COVID-19 related respiratory failure and IPF flareup and ARDS.  He then had a complication by aspiration for which she did not get intubated according to the son.  He then spent time in inpatient rehab and is now home for the last 3 weeks.  At home he is continue to improve.  He is on a chronic prednisone  taper with Bactrim .  At this point in time he is not needing oxygen at rest based on home pulse oximetry monitoring.  Here we walked him 5 feet and back on room air and he did not desaturate below 94%.  Recent liver function test normal.  Anemia also improving.   He has had continued weight loss although he has gained weight since the hospitalization compared to prehospitalization this further weight loss to 114 pounds.  Some of this was pirfenidone  mediated.  He is now back on the pirfenidone  for the last few weeks.  They also feel that some of the weight loss is because he is not able to take enough food because of the dysphagia and the cough.  He continues to be physically deconditioned but even that he has improved.  He is able to do some ADLs.  He is able to take shower with the help of his wife.  He is able to walk within the house although still a little bit wobbly   He now has a chronic Foley which she is not able to get rid of after the hospitalization.  He follows with Jeff. Derrick Fling neurology.   The main issue right now is his cough and clearing of the throat.  The 2 are slightly unrelated.  He coughs when he eats food.  He also coughs when he clears her throat.  He throats clears his throat independent of the cough.  Eating food makes him clear the throat more. .  At night he does not clear the throat.  He has not seen ENT in 2 years.  There is no history of difficult intubation.  He and her son feel it is related to  his dysphagia issues.  Mid November 2023 he is being scheduled for esophageal dilatation by Jeff. Alvis Jourdain.  I did tell them  that the risk for endoscopy is acceptable given the fact he was able to walk 5-10 feet without any desaturation on room air ..  I supported him having the endoscopy.  They believe there might be some sinus drainage.  Did encourage him to try over-the-counter Flonase  and also Mucinex .   He did have some back pain in the hospital and was started on gabapentin .  Is clearly not helping his cough or clearing of the throat.  He is back pain is not much of an issue.  We took a shared decision making he can come off this.      04/01/2022 Patient presents today for follow-up.  She has a history of IPF recently diagnosed with community-acquired pneumonia.  Chest x-ray on 03/20/2022 showed increased left lung opacity.  He was treated with liquid Augmentin  x 7 days. Cough is worse the last several days. Son reports that he has several distince coughs. No associated shortness of breath. He has new swelling in feet last 4-5 days. Constantly clearing his throat, aggressively trys to cough up mucus but cough is mainly dry. When he lays flat he does not cough.  He is compliant with Esbriet  and taking prednisone  every other day. No increased oxygen demands. During last office visit with Jeff. Bertrum Brodie he was referred to ENT due to cough to rule out postnasal drip as cause. He has not been contacted to schedule visit.  He had an EGD in November which showed esophageal stenosis.  Takes Protonix  twice daily, needs refill.       OV 06/20/2022  Subjective:  [name], Jeff Willis , DOB: [birth year] , age 48 y.o. , MRN: [id number] , ADDRESS: [address] [place] [address] PCP Jannelle Memory, MD Patient Care Team: Jannelle Memory, MD as PCP - General (Family Medicine) Millicent Ally, MD as PCP - Cardiology (Cardiology)  This Provider for this visit: Treatment Team:   Attending Provider: Maire Scot, MD   06/20/2022 -visit after exposure to flulike illness.   Jeff Sabastian G Husk 83 y.o. -presents with son benign.  This is an acute visit.  He has full schedule regular visit coming up.  Some 3-4 days ago son thought he had the flu.  Initially said he had the flu but later said he did not test himself for the flu but he was flulike illness.  The daughter-in-law who also lives in the house got similar symptoms then 2 days ago patient started with body ache and cough and wet cough that is increased compared to baseline.  The sputum was clear in color and there is no fever or chills.  There is no runny nose.  Currently he is improving actually.  His cough intensity has come down his body ache is come down.  He is to do pulmonary rehabilitation and is not desaturating but he still uses his oxygen for psychological comfort.  He did tell me that when he goes upstairs he goes down to 88-89% but this is baseline.  He does not think he is desaturating currently.  We did a rapid flu test and it was negative.  He is continue to lose weight.  He had endoscopy in November 2023 for esophageal stenosis.  Frequent scheduled endoscopy at [facility] is pending by Jeff. Alvis Jourdain.  He is continue to lose weight.      OV 07/02/2022  Subjective:  [name], Jeff Willis , DOB: 1940/12/03 , age 33 y.o. , MRN: [id number] ,  ADDRESS: [address] [place] [address] PCP Jannelle Memory, MD Patient Care Team: Jannelle Memory, MD as PCP - General (Family Medicine) Millicent Ally, MD as PCP - Cardiology (Cardiology)  This Provider for this visit: Treatment Team:  Attending Provider: Maire Scot, MD   07/02/2022 -   Chief Complaint  Patient presents with   Follow-up    PFT     Jeff Daunte G Duggar 83 y.o. -returns for follow-up.  I recently saw him a few weeks ago because of exposure to flulike illness.  He states he is  back to baseline.  However does appear to have lost 5 more pounds of weight in the last 2 weeks.  His son is surprised by this.  Patient does admit to low appetite.  However he is known to deal with esophageal stenosis.  The son did indicate to me at last visit and again this visit that Jeff. Alvis Jourdain is considering sequential endoscopy with sequential dilatation at specific interval.  Apparently this has been hard to schedule with the endoscopy suite at [facility].  At the request I did reach to Franco Isaac the director for the endoscopy suite to work with Jeff. Alvis Jourdain to make sure this was possible because of his ongoing weight loss.  Son is also not sure whether it is accurate weight loss of 5 pounds in the last 2 weeks.  Therefore I asked them to track dry weight on a scheduled basis at a specific fix time.  We did discuss about the possibility of pirfenidone  playing a role in the weight loss.  They are also having a swallowing evaluation coming up [facility]   He did have lung function test today and overall there is a 35% decline in [facility] compared to May 2023 [his COVID was in July August 2023].  There is at least 14% progression since fall 2023.  They are surprised by the decline in PFT because they said that he is actually doing better his cough is better and his symptom scores are better.  He does have oxygen but he states that rest he does not need it.  Also nighttime he is not using it consistently and does not feel the need to.  They say when they checked the pulse ox when he is exerting or sitting still it is stable.  Overall son and he were in alignment that ever since COVID in summer 2023 his overall health status is declined. CT Chest data OV 09/12/2022  Subjective:  Patient ID: WILLSON LIPA, Jeff Willis , DOB: [birth year] , age 50 y.o. , MRN: [id number] , ADDRESS: [address] [place] [address] PCP Jannelle Memory, MD Patient Care Team: Jannelle Memory, MD as PCP -  General (Family Medicine) Millicent Ally, MD as PCP - Cardiology (Cardiology) Mealor, Donnamae Gaba, MD as PCP - Electrophysiology (Cardiology)  This Provider for this visit: Treatment Team:  Attending Provider: Maire Scot, MD    09/12/2022 -   Chief Complaint  Patient presents with   Follow-up    F/up wet cough, thin and white mucous.     Jeff Trindon G Delmore 83 y.o. -returns for follow-up.  Presents with her son Jeff Willis.  He is not using oxygen anymore.  In fact he did a sit/stand test and he did not need oxygen.  Although he is sitting here with oxygen.  He feels stable he is driving car.  However he continues to lose weight although the pace of the  weight loss might of stabilized.  He continues his pirfenidone .  His main issues appear to be swallowing related.  He did have esophageal dilatation early March 2024 by Jeff. Alvis Jourdain.  Subsequent to that has seen ENT has had extensive evaluation at [facility].  The son showed me a video of a barium swallow test.  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  He needs to drink water  to swallow the pill.  Referral has been made to [facility]..  Also in the last week he has had increased cough which appears to be wet but there is no fever or chills wheezing.  The son thinks is because he reduced his Protonix .  He wants to increase the Protonix .  If this does not work recommended a 5-day prednisone  taper.  Also he plans to go to [place] in October 2024.  I cautioned him somewhat against this because of his frailty but decided will repeat pulmonary function test in September 2023 and decide.     OV 01/23/2023  Subjective:  [name], Jeff Willis , DOB: 10/02/1940 , age 9 y.o. , MRN: [id number] , ADDRESS: [address] [place] [address] PCP Jannelle Memory, MD Patient Care Team: Jannelle Memory, MD as PCP - General (Family Medicine) Millicent Ally, MD as PCP - Cardiology (Cardiology) Mealor, Donnamae Gaba, MD as PCP -  Electrophysiology (Cardiology)  This Provider for this visit: Treatment Team:  Attending Provider: Maire Scot, MD    01/23/2023 -   Chief Complaint  Patient presents with   Follow-up    Jeff Jeff G Stick 83 y.o. -returns for follow-up.  Presents with her son Jeff Willis who is an independent historian.  Several issues  #IPF: He feels he is stable.  Excess hypoxemia test today is stable .  He feels the symptoms are stable but his FVC shows a decline.  #Physical deconditioning: He is using a cane.  It appears he had a fall 1 time is the first ever fall.  There is no visible injury.  He thinks it is a mechanical fall because he was not using his cane properly.  The family is monitoring it but overall he is stronger compared to his admission last year but not made any further improvement  #Cachexia and failure to thrive and weight loss: On my scale her weight is stable.  We have assumed this was because of Esbriet  but the son feels that some of weight loss.  They did indicate he has prostatic cancer  #Prostate cancer and hilar adenopathy: He had a PET scan end of August 2024 personally visualized that he has a right hilar adenopathy that is lighting up on PET scan along with pleural metastasis.  The son believes it is from his prostate cancer.  They are going to see a urologist now at [facility].  He is currently seeing Jeff. Derrick Fling.  I did indicate to him that if the urologist feels this requires a biopsy then we would be able to biopsy it here  #Travel advice: He desperately wants to go to [place] between November and February 2025.  His x-rays hypoxemia test is stable and although there are no contraindications I did tell him there was elevated risk.  Given the letter for oxygen support for air travel.  They are going for stat domestic air travel.   #Cough and dysphagia this is somewhat stable.  Reviewed the notes in April 2024 by GI at [facility].  He has irritable larynx.  They might try another GI doctor.   OV 03/06/2023  Subjective:  [name], Jeff Willis , DOB: Nov 13, 1940 , age 38 y.o. , MRN: [id number] , ADDRESS: [address] [place] [address] PCP Jannelle Memory, MD (Inactive) Patient Care Team: Jannelle Memory, MD (Inactive) as PCP - General (Family Medicine) Millicent Ally, MD as PCP - Cardiology (Cardiology) Mealor, Donnamae Gaba, MD as PCP - Electrophysiology (Cardiology)  This Provider for this visit: Treatment Team:  Attending Provider: Maire Scot, MD    03/06/2023 -   Chief Complaint  Patient presents with   Follow-up    Pt states he is doing well,      Jeff Somtochukwu G Armentor 83 y.o. -returns for follow-up with son Jeff Willis.  Overall he is stable.    Travel advice: This visit is for he had to [place] end of November 2024.  He will be back in March 2025.  His symptom score in excess hypoxemia test is stable.  I did advise him to get liver function test in [place].  I also did fill out a letter for him to show the airline so he can take his portable oxygen  IPF: Clinically stable.  Excess hypoxemia test shows stability  Therapeutic monitoring and pirfenidone  intake: Tolerating it well.  Recent liver function test normal.  He will have liver function test in [place].  For this refill the pharmacy or the genetic team sent him capsule which she cannot swallow.  Therefore he wants a tablet.  Personally communicated this with the pharmacy team.  Weight loss: Stable   Malignant neoplasm: He has prostate cancer and there is believe that is spread to the lungs with pleural metastasis.  This was on a PET scan.  I was concerned that this could be another primary malignancy.  Therefore advised him to discuss this with his oncologist at [facility].  According to him and his son this was discussed and no biopsy has been recommended.  He did see Jeff. Carilyn Charles on 01/29/2023 and confirmed the oncologist impression that  resolved prostate cancer with spread.   Dysphagia: He is now seeing another GI doctor.  He is seeing Jeff. [facility] [place].  Notes reviewed 02/27/2023.  He      OV 09/18/2023  Subjective:  [name], Jeff Willis , DOB: [birth year] , age 17 y.o. , MRN: [id number] , ADDRESS: [address] [place] [address] PCP Jannelle Memory, MD (Inactive) Patient Care Team: Jannelle Memory, MD (Inactive) as PCP - General (Family Medicine) Millicent Ally, MD as PCP - Cardiology (Cardiology) Mealor, Donnamae Gaba, MD as PCP - Electrophysiology (Cardiology)  This Provider for this visit: Treatment Team:  Attending Provider: Maire Scot, MD    Idiopathic pulmonary fibrosis on nintedanib  100 mg twice daily since Oct 2021   -last CT September 2022, pft oct 2022 -> progression on CT scan  -Stop nintedanib  September 2022 due to weight loss and intolerance  -Started pirfenidone  October 2022 -> reduced to 2 pills 3 times daily March 2023 due to weight loss  IPF flareup due to COVID-19 summer 2023 followed by aspiration pneumonia and acute respiratory failure needing oxygen and intubation  -History of intubation  - Course complicated by aspiration  -On prednisone  therapy since discharge  Weight loss unintentional even before nintedanib   -Improved September/October 2022 after stopping nintedanib   -relapse while in [place] but after starting pirfenidone  in the fall 2022 and early 2023.  -Progressive weight  loss after summer 2023 hospitalization for COVID-19  - June 2021: 137#  - 114# - oct 2023  - 110# 06/20/2022  -105# 07/02/2022 (son surpise)d  - 104# 09/12/2022 and 01/23/2023  - 104# on 09/18/2023      Cough and Dysphagia ad clearing throat  -Normal endoscopy with Jeff. Tova Willis September 2021/October 2021  -Abnormal barium study September 2022  -Status post endoscopy with esophageal stenosis dilatation by Jeff. Nickey Barn April 2023.  -Worse in October 2023  after some hospitalization for COVID  - s/p mild eso stenosis dilation 07/12/22   Normal right heart cath July 2023 without pulmonary hypertension.   Esbriet /Pirfenidone  requires intensive drug monitoring due to high concerns for Adverse effects of , including  Drug Induced Liver Injury, significant GI side effects that include but not limited to Diarrhea, Nausea, Vomiting,  and other system side effects that include Fatigue, headaches, weight loss and other side effects such as skin rash. These will be monitored with  blood work such as LFT initially once a month for 6 months and then quarterly  09/18/2023 -  FU IPF      Jeff Joseh G Minogue 83 y.o. -returns for follow-up.  This for IPF.  He is on pirfenidone .  He is on low-dose protocol.  He has been doing well.  He feels he has gained weight in [place].  His mood is better.  He is tolerating pirfenidone  well no dysphagia.  In December 2024 he did have mild COVID they were able to give him some remaining stock on remdesivir  for 3 days.  In March 2025 he was at [facility] and his liver function test was normal.  His next trip to [place] will be in November 2025.  He is driving.  He feels he does not need oxygen.  I advised him based on his symptoms, exercise desaturation test that he can return both nighttime and his portable oxygen system.    SYMPTOM SCALE - ILD 10/05/2019 137 pounds 01/17/2020 134 pounds 03/01/2020 131# - start ofev  oct 2021 07/24/2020 127# on ofev  (nintib from [place] by [facility] x 2 months) 100mg  bid 09/22/2020 122# - on ofev  100mg  bid 01/11/2021 Tele visit - off ofev   128# not on any antifibrotic 02/06/2021 131# -esbriet  x 1 week 09/07/2021 125#  02/25/2022 114#  06/20/2022 110# 07/02/2022 105# 09/12/2022 104# 01/23/2023  03/06/2023 104# 09/18/2023 104#  O2 use ra ra ra ra ra  ra   ra ra  ra ra  Shortness of Breath 0 -> 5 scale with 5 being worst (score 6 If unable to do)               At rest 0 0 0 0 0  0   0 0  0 2   Simple tasks - showers, clothes change, eating, shaving 0 0 0 1 0  0 0  1 1  0 1  Household (dishes, doing bed, laundry) 0 1 1  0 0  0 1.5  na na  na 1  Shopping 0 0 0 0   0 nan  na 1  0 1  Walking level at own pace 0 0 0 0 0  0 1  0 0  0 0  Walking up Stairs 1 0 1 0 0  0 2  1 1  1 1   Total (30-36) Dyspnea Score 1 1 2 1  0  0 3.5  2 3  2 6   How bad is your cough? 2 3 0 2  2  2 5   1.5 3  1  0  How bad is your fatigue 0 0 0 0 0  0 4  1.5 3  0 0  How bad is nausea 0 0 0 1 0  0 0  0 0  0 0  How bad is vomiting?  00 0 0 0 0  0 0  0 0  0 0  How bad is diarrhea? 0 0 0 3 3  0 0  0 0  0 0  How bad is anxiety? 00 1 1 1  0  1 1.5  1 1   0 0  How bad is depression 0 1 1 1  0  1 1.5  0 0  0 0         Simple office walk 185 feet x  3 laps goal with forehead probe 10/05/2019  03/01/2020  07/24/2020  09/22/2020  02/06/2021  09/07/2021  09/12/2022  01/23/2023  03/06/2023   O2 used ra ra  ra ra ra ra ra ra  Number laps completed 3   3   Sit stand x 10 resp Sit stand x 10 with cane Sit stand x10  Comments about pace avt   Mod paced avg avg steady steady   Resting Pulse Ox/HR 100% and 122/min 98% and 110 1-00% and 120 100% and 109/min 99% and 89 100% and 85 97-96% and HR 113 97% and HR 73 95% and HR 69  Final Pulse Ox/HR 100% and 143/min 97% and 130 99% and 130 99% and 124/min 99% and 114 98% ad 115 92-93% and HR 115 95% and HR 78 93% and HR 73  Desaturated </= 88% x x  no no  no    Desaturated <= 3% points x x  no no  yes    Got Tachycardic >/= 90/min x x  ye yes  yes    Symptoms at end of test x x  none none      Miscellaneous comments x x No dyspnea No dyspnea No dyspnea No dyspnea Mild 1 of 10 dyspnea 3 of 10 dyspnea        SIT STAND TEST - goal 15 times   09/18/2023    O2 used ra   PRobe - finter or forehead finger   Number sit and stand completed - goal 15 15   Time taken to complete 60   Resting Pulse Ox/HR/Dyspnea   99% and 64/min and dyspnea of 1/10    Peak measures 98 % and 78/min and  dyspnea of 3/10   Final Pulse Ox/HR 94% and 74/min and dyspnea of 1/10   Desaturated </= 88% no   Desaturated <= 3% points yes   Got Tachycardic >/= 90/min no   Miscellaneous comments x       PFT     Latest Ref Rng & Units 09/18/2023    1:47 PM 01/14/2023    4:00 PM 09/02/2022    2:04 PM 07/01/2022   10:42 AM 02/25/2022    3:12 PM 09/07/2021   10:48 AM 02/05/2021   10:44 AM  PFT Results  FVC-Pre L 1.97  P 1.78  1.88  1.68  1.96  2.63  2.67   FVC-Predicted Pre % 64  P 57  60  54  63  84  85   FVC-Post L    1.60      FVC-Predicted Post %    51      Pre FEV1/FVC % % 99  P 93  94  94  89  85  87   Post FEV1/FCV % %    92      FEV1-Pre L 1.95  P 1.66  1.76  1.58  1.75  2.24  2.33   FEV1-Predicted Pre % 84  P 71  75  68  74  95  97   FEV1-Post L    1.47      DLCO uncorrected ml/min/mmHg 11.14  P 5.40  0.44   8.46  12.24  13.45   DLCO UNC% % 41  P 20  1   31   45  49   DLCO corrected ml/min/mmHg  5.40  0.44   9.22  12.24  13.45   DLCO COR %Predicted %  20  1   34  45  49   DLVA Predicted % 80  P 52  6   68  72  80   TLC L    3.54      TLC % Predicted %    56      RV % Predicted %    74        P Preliminary result       LAB RESULTS last 96 hours No results found.       has a past medical history of Back pain, Elevated PSA, Essential tremor, Hypercholesteremia, Neuropathy, Prostate cancer (HCC), and Sinus tachycardia (01/30/2018).   reports that he has never smoked. He has never used smokeless tobacco.  Past Surgical History:  Procedure Laterality Date   COLONOSCOPY WITH PROPOFOL  N/A 08/10/2021   Procedure: COLONOSCOPY WITH PROPOFOL ;  Surgeon: Alvis Jourdain, MD;  Location: [facility] ENDOSCOPY;  Service: Gastroenterology;  Laterality: N/A;   ESOPHAGEAL DILATION  08/10/2021   Procedure: ESOPHAGEAL DILATION;  Surgeon: Alvis Jourdain, MD;  Location: [facility];  Service: Gastroenterology;;   ESOPHAGOGASTRODUODENOSCOPY (EGD) WITH PROPOFOL  N/A 08/10/2021   Procedure:  ESOPHAGOGASTRODUODENOSCOPY (EGD) WITH PROPOFOL ;  Surgeon: Alvis Jourdain, MD;  Location: [facility];  Service: Gastroenterology;  Laterality: N/A;   ESOPHAGOGASTRODUODENOSCOPY (EGD) WITH PROPOFOL  N/A 03/12/2022   Procedure: ESOPHAGOGASTRODUODENOSCOPY (EGD) WITH PROPOFOL ;  Surgeon: Alvis Jourdain, MD;  Location: [facility];  Service: Gastroenterology;  Laterality: N/A;   ESOPHAGOGASTRODUODENOSCOPY (EGD) WITH PROPOFOL  N/A 07/12/2022   Procedure: ESOPHAGOGASTRODUODENOSCOPY (EGD) WITH PROPOFOL ;  Surgeon: Alvis Jourdain, MD;  Location: [facility];  Service: Gastroenterology;  Laterality: N/A;   HEMOSTASIS CLIP PLACEMENT  08/10/2021   Procedure: HEMOSTASIS CLIP PLACEMENT;  Surgeon: Alvis Jourdain, MD;  Location: [facility];  Service: Gastroenterology;;   LUMBAR MICRODISCECTOMY     L4-5   POLYPECTOMY  08/10/2021   Procedure: POLYPECTOMY;  Surgeon: Alvis Jourdain, MD;  Location: [facility];  Service: Gastroenterology;;   RADIOACTIVE SEED IMPLANT N/A 02/24/2018   Procedure: RADIOACTIVE SEED IMPLANT/BRACHYTHERAPY IMPLANT;  Surgeon: Christina Coyer, MD;  Location: [facility];  Service: Urology;  Laterality: N/A;   RIGHT HEART CATH N/A 11/22/2021   Procedure: RIGHT HEART CATH;  Surgeon: Millicent Ally, MD;  Location: [facility];  Service: Cardiovascular;  Laterality: N/A;   SAVORY DILATION N/A 03/12/2022   Procedure: SAVORY DILATION;  Surgeon: Alvis Jourdain, MD;  Location: [facility];  Service: Gastroenterology;  Laterality: N/A;   SAVORY DILATION N/A 07/12/2022   Procedure: SAVORY DILATION;  Surgeon: Alvis Jourdain, MD;  Location: [facility];  Service: Gastroenterology;  Laterality: N/A;   SPACE OAR INSTILLATION N/A 02/24/2018   Procedure: SPACE OAR INSTILLATION;  Surgeon: Christina Coyer, MD;  Location: [facility];  Service: Urology;  Laterality: N/A;    Allergies  Allergen Reactions   Beef-Derived Drug Products Other (See Comments)    Vegetarian    Chicken Protein Other (See Comments)    Vegetarian   Egg-Derived Products Other (See Comments)    Vegetarian   Fish-Derived Products Other (See Comments)    Vegetarian   Meat Extract Other (See Comments)    Vegetarian   Ofev  [Nintedanib ] Other (See Comments)    Weight loss   Pork-Derived Products Other (See Comments)    Vegetarian    Immunization History  Administered Date(s) Administered   Influenza, High Dose Seasonal PF 01/28/2019, 01/12/2021, 02/11/2022, 01/08/2023   Influenza-Unspecified 01/05/2020   PFIZER(Purple Top)SARS-COV-2 Vaccination 07/23/2019, 08/17/2019   Pneumococcal-Unspecified 02/28/2015   Tdap 01/08/2023   Zoster, Live 01/26/2021    Family History  Problem Relation Age of Onset   Other Mother        died at 42 of natural causes   Other Father        unsure of health   Cancer Neg Hx      Current Outpatient Medications:    aspirin  EC 81 MG tablet, Take 81 mg by mouth daily. Swallow whole., Disp: , Rfl:    darolutamide (NUBEQA) 300 MG tablet, Take 300 mg by mouth 2 (two) times daily with a meal., Disp: , Rfl:    metoprolol  succinate (TOPROL -XL) 50 MG 24 hr tablet, Take 1 tablet (50 mg total) by mouth daily. Take with or immediately following a meal., Disp: 90 tablet, Rfl: 3   Multiple Vitamin (MULTIVITAMIN WITH MINERALS) TABS tablet, Take 1 tablet by mouth daily., Disp: , Rfl:    OXYGEN, Inhale 1-2 L into the lungs continuous., Disp: , Rfl:    pantoprazole  (PROTONIX ) 40 MG tablet, Take 1 tablet (40 mg total) by mouth 2 (two) times daily. (Patient taking differently: Take 40 mg by mouth daily.), Disp: 30 tablet, Rfl: 2   Pirfenidone  267 MG TABS, Take 2 tablets (534 mg) by mouth in the morning, at noon, and at bedtime. **please dispense 3 month supply of TABLETS if possible**, Disp: 540 tablet, Rfl: 0   pregabalin (LYRICA) 50 MG capsule, Take 50 mg by mouth 2 (two) times daily. Patient takes 100 mg at night and 50 mg daily, Disp: , Rfl:    rosuvastatin   (CRESTOR ) 10 MG tablet, TAKE 1 TABLET BY MOUTH DAILY, Disp: 90 tablet, Rfl: 3      Objective:   Vitals:   09/18/23 1427  BP: 118/68  Pulse: 64  SpO2: 99%  Weight: 104 lb (47.2 kg)  Height: 5\' 6"  (1.676 m)    Estimated body mass index is 16.79 kg/m as calculated from the following:   Height as of this encounter: 5\' 6"  (1.676 m).   Weight as of this encounter: 104 lb (47.2 kg).  @WEIGHTCHANGE @  [employer]   09/18/23 1427  Weight: 104 lb (47.2 kg)     Physical Exam   General: No distress.  Loks bett O2 at rest: no Cane present: no Sitting in wheel chair: no Frail: no Obese: non Neuro: Alert and Oriented x 3. GCS 15. Speech normal Psych: Pleasant Resp:  Barrel Chest - o.  Wheeze - no, Crackles - YES BASE, No overt respiratory distress CVS: Normal heart sounds. Murmurs - no Ext: Stigmata of Connective Tissue Disease - no HEENT: Normal upper airway. PEERL +. No post nasal drip        Assessment:  ICD-10-CM   1. IPF (idiopathic pulmonary fibrosis) (HCC)  H84.696 Pulmonary function test    Ambulatory Referral for DME    2. Medication monitoring encounter  Z51.81 Pulmonary function test         Plan:     Patient Instructions     ICD-10-CM   1. IPF (idiopathic pulmonary fibrosis) (HCC)  J84.112     2. Weight loss, non-intentional  R63.4     3. Physical deconditioning  R53.81     4. Medication monitoring encounter  Z51.81     5. Esophageal stenosis  K22.2     6. Chronic cough  R05.3     7. Travel advice encounter  Z71.84     8. Mediastinal adenopathy  R59.0     9. 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 History of prostate cancer  Z85.46        IPF (idiopathic pulmonary fibrosis) (HCC) Chronic respiratory failure after COVID-19 related hospitalization summer 2023   -PFT May 2025 back to levels of OCt 2023 but worse than May 2023 - Noted covid (mild) in [place] in dec 2024 - Noted Normal liver functino test at at [facility] March  2025 - glad overall stable and well  Plan -continue esbriet  2 pills three times daily with food   - dc night and portable o2  - CMA to send order - spirometry and dlco in [place]  [id number]  before next [place] trip   Weight loss, non-intentional  -stable    Plan -monitor for weight loss with esbriet  for now -Continue high-protein diet as below   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 Damaris Duhamel in April 2024 diagnosis irritable larynx - no issues on 09/18/2023  Plan -per ENT and GI    Physical deconditioning - new since summer 2023  -Much improved after hospitalization and inpatient rehab  and with time  Plan -Continue strengthening exercises -Continue high-protein vegetarian diet through legumes and tofu      Follow-up  - Oct 2025 - 15 min visit after back from [place]   - symptom score and exercise hypoxemia test   FOLLOWUP Return in about 5 months (around 02/18/2024) for 15 min visit, after Spiro and DLCO, with Jeff Bertrum Brodie, Face to Face Visit.    SIGNATURE    Jeff. Maire Scot, M.D., F.C.C.P,  Pulmonary and Critical Care Medicine Staff Physician, [facility] Director - Interstitial Lung Disease  Program  Pulmonary Fibrosis [facility] [place], [place], [address]  Pager: [phone], If no answer or between  15:00h - 7:00h: call [phone] Telephone: [phone]  3:26 PM 09/18/2023

## 2023-09-18 NOTE — Progress Notes (Signed)
Spiro and DLCO performed today. 

## 2023-09-18 NOTE — Patient Instructions (Signed)
Spiro and DLCO performed today. 

## 2023-09-18 NOTE — Patient Instructions (Addendum)
 ICD-10-CM   1. IPF (idiopathic pulmonary fibrosis) (HCC)  J84.112     2. Weight loss, non-intentional  R63.4     3. Physical deconditioning  R53.81     4. Medication monitoring encounter  Z51.81     5. Esophageal stenosis  K22.2     6. Chronic cough  R05.3     7. Travel advice encounter  Z71.84     8. Mediastinal adenopathy  R59.0     9. 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 History of prostate cancer  Z85.46        IPF (idiopathic pulmonary fibrosis) (HCC) Chronic respiratory failure after COVID-19 related hospitalization summer 2023   -PFT May 2025 back to levels of OCt 2023 but worse than May 2023 - Noted covid (mild) in [place] in dec 2024 - Noted Normal liver functino test at at [facility] March 2025 - glad overall stable and well  Plan -continue esbriet  2 pills three times daily with food   - dc night and portable o2  - CMA to send order - spirometry and dlco in [place]  [id number]  before next [place] trip   Weight loss, non-intentional  -stable    Plan -monitor for weight loss with esbriet  for now -Continue high-protein diet as below   Hilar adenopathy along with pleural metastasis on PET scan August 2024 Hx of Prostate Cancer  -There is presumption that this is from prostate cancer  Plan - per your oncologist  COUGH and DYSPHAGIA and clearing throat New History of intubation - summer 2023  - - s/p dilatation by Dr Nickey Barn April 2023 and march 2024  - ENT eval by [facility] by Dr. Damaris Duhamel in April 2024 diagnosis irritable larynx - no issues on 09/18/2023  Plan -per ENT and GI    Physical deconditioning - new since summer 2023  -Much improved after hospitalization and inpatient rehab  and with time  Plan -Continue strengthening exercises -Continue high-protein vegetarian diet through legumes and tofu      Follow-up  - Oct 2025 - 15 min visit after back from [place]   - symptom score and exercise hypoxemia test

## 2023-09-19 ENCOUNTER — Other Ambulatory Visit (HOSPITAL_COMMUNITY): Payer: Self-pay

## 2023-09-23 ENCOUNTER — Other Ambulatory Visit: Payer: Self-pay

## 2023-09-23 NOTE — Progress Notes (Signed)
 Specialty Pharmacy Refill Coordination Note  Jeff Willis is a 82 y.o. male contacted today regarding refills of specialty medication(s) Pirfenidone    Patient requested Delivery   Delivery date: 09/25/23   Verified address: [address]   [place] [address]   Medication will be filled on 05.28.25.

## 2023-09-24 ENCOUNTER — Other Ambulatory Visit: Payer: Self-pay | Admitting: Family

## 2023-09-24 DIAGNOSIS — R000 Tachycardia, unspecified: Secondary | ICD-10-CM

## 2023-09-24 DIAGNOSIS — I2510 Atherosclerotic heart disease of native coronary artery without angina pectoris: Secondary | ICD-10-CM

## 2023-10-18 ENCOUNTER — Telehealth: Payer: Self-pay | Admitting: Internal Medicine

## 2023-10-18 DIAGNOSIS — J84112 Idiopathic pulmonary fibrosis: Secondary | ICD-10-CM

## 2023-10-20 ENCOUNTER — Other Ambulatory Visit: Payer: Self-pay

## 2023-10-20 MED ORDER — PIRFENIDONE 267 MG PO TABS
ORAL_TABLET | ORAL | 0 refills | Status: DC
  Filled 2023-10-20: qty 540, fill #0

## 2023-10-20 NOTE — Telephone Encounter (Signed)
 Refill for pirfenidone  sent to [facility]  Sherry Pennant, PharmD, MPH, BCPS, CPP Clinical Pharmacist (Rheumatology and Pulmonology)

## 2023-10-21 ENCOUNTER — Encounter: Payer: Self-pay | Admitting: Cardiovascular Disease

## 2023-10-21 ENCOUNTER — Ambulatory Visit: Attending: Cardiovascular Disease | Admitting: Cardiovascular Disease

## 2023-10-21 VITALS — BP 110/66 | HR 69 | Ht 66.0 in | Wt 105.6 lb

## 2023-10-21 DIAGNOSIS — I4719 Other supraventricular tachycardia: Secondary | ICD-10-CM | POA: Diagnosis not present

## 2023-10-21 DIAGNOSIS — I7121 Aneurysm of the ascending aorta, without rupture: Secondary | ICD-10-CM

## 2023-10-21 DIAGNOSIS — I2510 Atherosclerotic heart disease of native coronary artery without angina pectoris: Secondary | ICD-10-CM

## 2023-10-21 DIAGNOSIS — J84112 Idiopathic pulmonary fibrosis: Secondary | ICD-10-CM

## 2023-10-21 DIAGNOSIS — E785 Hyperlipidemia, unspecified: Secondary | ICD-10-CM

## 2023-10-21 NOTE — Progress Notes (Signed)
 Cardiology Office Note    Date:  10/21/2023   ID:  Jeff Willis, Jeff Willis 15-Apr-1941, MRN [id number]  PCP:  Hugh Charleston, MD (Inactive)  Cardiologist:  Debby Sor, MD   12 month F/U cardiology evaluation initially referred through the courtesy of Dr. Charleston Hugh  History of Present Illness:  Jeff Willis is a 83 y.o. male who is originally from [place] but has been living in [place]  for [place] years.  He has been followed by Dr. Geronimo and is felt to have probable interstitial lung disease.  A high-resolution CT scan of his chest showed probable UIP pattern with possible early honeycombing.  He also was noted to have an ectatic 4.5 cm ascending thoracic aorta and aortic atherosclerosis involving the left main and two-vessel CAD. He recently saw Dr. Hugh and has had issues with elevated heart rate that seems to have been fairly consistent over several office visits.  He was referred for an echo Doppler study on Sep 07, 2020 by Dr. Geronimo EF of 60 to 65% without wall motion abnormalities.  Right ventricular size was normal.  There was mildly elevated pulmonary artery pressure.  Estimated RV systolic pressure was 34.8.  There was mild to moderate tricuspid regurgitation.  His aortic valve was trileaflet and there was no stenosis.  When I saw him for initial evaluation on November 29, 2020 he admitted to a longstanding history of increased heart rate which often was contributed by anxiety.  He was typically drinking tea approximately 2 times per day.  He typically walks 30 minutes 4 days/week and denies any exertional anginal symptomatology.  He specifically denies any chest tightness or exertional shortness of breath.  He denies any presyncope or syncope.  He is unaware of any history of atrial fibrillation.  He had been treated with Ofev  for his probable interstitial lung disease but due to significant reduction in appetite this was discontinued approximately 4-1/2 months  ago.  At his initial evaluation, I reviewed the investigations by Dr. Geronimo as well as Dr. Hugh.  Prior lipid studies had shown an LDL of 133.  I recommended he undergo follow-up laboratory with a comprehensive metabolic panel, magnesium  level, free T3 and free T4 CBC and lipid studies.  I initiated metoprolol  succinate with low-dose with plans to titrate as necessary and recommended he undergo coronary CTA for further evaluation of his CT detected coronary atherosclerosis.  Coronary CTA was done on December 08, 2020 which revealed a calcium  score of 18 placing him in only the 9th percentile for age and sex matched control.  He had right dominant coronary arteries with minimal atherosclerotic plaque in the mid RCA less than 25%, distal left main less than 25%, 25 to 49% in the LAD, and less than 25% in the circumflex vessel.  Chest over read showed interstitial lung disease with possible progressive groundglass in the lower lobes.  There also was a 1 cm central left upper lobe pulmonary nodule.  Ascending aortic aneurysm was 4.2 cm.  Chemistry and thyroid  laboratory were essentially normal,however, total cholesterol was 208 and LDL 129 with HDL of 60..  Presently, he feels well.  His resting pulse has improved with metoprolol  succinate 25 mg daily.  He is on rosuvastatin  10 mg, and Pepcid  40 mg.  He continues to be on Esbriet  for his interstitial lung disease and baby aspirin .  Since I saw him on February 05, 2021 he and his wife went to [place] for 4  months.  They returned last week.  I saw him in follow-up on July 12, 2021 at which time he continued to feel well and denied any chest pain or shortness of breath.  He denied palpitations, presyncope or syncope.  He continues to be active.  He is on metoprolol  succinate 25 mg daily in addition to rosuvastatin  10 mg and aspirin .  He takes Protonix  for GERD.  During that evaluation I recommended he continue taking metoprolol  succinate 25 mg daily particularly  with his mild dilatation of ascending aorta at 43 mm.  He denied any significant wheezing or shortness of breath.  His GERD was controlled with Protonix  and he was tolerating rosuvastatin  10 mg.  He subsequently was reevaluated by Dr. Geronimo in May 2023 for his interstitial lung disease/idiopathic pulmonary fibrosis.  His last CT in September 2022 suggested progression on CT scan and his prior treatment with nintedanib  100 mg twice a day and it was discontinued and he was changed to pirfenidone .  Since I saw him, he was referred for a follow-up echo Doppler study on October 10, 2021.  This showed hyperdynamic LV function with EF 65 to 70% with grade 1 diastolic dysfunction.  On this echo his RV systolic pressure had risen and was now felt to be 39.2 mmHg.  As result of this increase in pulmonary pressure, it was recommended by Dr. Geronimo that he undergo a right heart cardiac catheterization.  I saw him on November 14, 2021 when he presented to the office to discuss this need for right heart catheterization with ultimate scheduling.  He denies chest pain.  He is unaware of audible wheezing.  He denies palpitations.  He continues to be on pirfenidone  267 mg 2 tablets in the morning, at noon, and at bedtime, metoprolol  succinate 25 mg daily, aspirin  81 mg, and rosuvastatin  10 mg.    He underwent right and left heart cardiac catheterization on November 22, 2021 which showed RA pressure A-wave 3, V wave to; RV 31/6, PA 30/7, mean 8.  O2 saturation in the PA was 67% and AO 97%.  By the [facility] method cardiac output was 3.9 L/min with cardiac index 2.4 L/min/m.  PVR was 2.6 [place]  In September 2023, he underwent extensive hospitalization and presented on December 31, 2021 with hypoxia, tachycardia, low-grade fevers due to COVID which she developed towards the end of the cruise.  He required prolonged intubation and had significant issues of hypoglycemia, acute hypoxic respiratory failure due to aspiration pneumonia with  pneumonitis, urinary retention as well as abdominal distention.  He was subsequently admitted to rehab from September 22 through January 28, 2022.  He was seen in cardiology follow-up on May 10, 2022 by Reche Vannie PIETY and at that time was feeling well and denied chest pain or shortness of breath.  At times he had noted some recurrence of tachycardia with rates up to 130.  He was subsequently evaluated by Dr. Nancey of EP.  Review of tracings that time showed atrial tachycardia with a rate at 129.  He was not significantly symptomatic and continued beta-blocker with metoprolol  was recommended.  I last saw Mr. Ortez on October 21, 2022 at which time he felt well.  He denied any chest pain or shortness of breath.  I will also add this guy Mr. to Carilyn have his this last 1 week taking 2 tablets 3 times a day they says he takes 2 tablets 3 times a day and estimates that he is around 01/17/2019 he  has been documented to have mild dilation of his ascending aorta at 4.5 cm on prior CT imaging in December 2023.  He is scheduled to undergo repeat CT angio of his chest and aorta on October 25, 2022.  He is here today with his brother.  He continues to be on metoprolol  succinate 75 mg daily without recurrent tachycardic events.  He is on her pirfenidone  for his idiopathic pulmonary fibrosis.  He has a prescription for furosemide  to take as needed.  He is on rosuvastatin  10 mg daily for hyperlipidemia.    He underwent a follow-up CT angio of his chest and aorta on July 08, 2023 which showed similar appearing fusiform ascending thoracic aortic aneurysm measuring up to 4.3 cm.  Presently,Mr. Witter feels well.  His primary MD has retired and he now sees Dr. Holley Catholic.  He is walking [phone] steps per day and denies chest pain, shortness of breath, or palpitations.  He was wondering whether or not he still needed to take aspirin  and was also wondering about the possibility of reducing his metoprolol  dose.  On his  current dose of metoprolol  50 mg daily he has not had any breakthrough episodes of atrial tachycardia.  He presents for evaluation.   Past Medical History:  Diagnosis Date   Back pain    Elevated PSA    Essential tremor    Hypercholesteremia    Neuropathy    Prostate cancer (HCC)    Sinus tachycardia 01/30/2018    Past Surgical History:  Procedure Laterality Date   COLONOSCOPY WITH PROPOFOL  N/A 08/10/2021   Procedure: COLONOSCOPY WITH PROPOFOL ;  Surgeon: Rollin Dover, MD;  Location: [facility];  Service: Gastroenterology;  Laterality: N/A;   ESOPHAGEAL DILATION  08/10/2021   Procedure: ESOPHAGEAL DILATION;  Surgeon: Rollin Dover, MD;  Location: [facility];  Service: Gastroenterology;;   ESOPHAGOGASTRODUODENOSCOPY (EGD) WITH PROPOFOL  N/A 08/10/2021   Procedure: ESOPHAGOGASTRODUODENOSCOPY (EGD) WITH PROPOFOL ;  Surgeon: Rollin Dover, MD;  Location: [facility];  Service: Gastroenterology;  Laterality: N/A;   ESOPHAGOGASTRODUODENOSCOPY (EGD) WITH PROPOFOL  N/A 03/12/2022   Procedure: ESOPHAGOGASTRODUODENOSCOPY (EGD) WITH PROPOFOL ;  Surgeon: Rollin Dover, MD;  Location: [facility];  Service: Gastroenterology;  Laterality: N/A;   ESOPHAGOGASTRODUODENOSCOPY (EGD) WITH PROPOFOL  N/A 07/12/2022   Procedure: ESOPHAGOGASTRODUODENOSCOPY (EGD) WITH PROPOFOL ;  Surgeon: Rollin Dover, MD;  Location: [facility];  Service: Gastroenterology;  Laterality: N/A;   HEMOSTASIS CLIP PLACEMENT  08/10/2021   Procedure: HEMOSTASIS CLIP PLACEMENT;  Surgeon: Rollin Dover, MD;  Location: [facility];  Service: Gastroenterology;;   LUMBAR MICRODISCECTOMY     L4-5   POLYPECTOMY  08/10/2021   Procedure: POLYPECTOMY;  Surgeon: Rollin Dover, MD;  Location: [facility];  Service: Gastroenterology;;   RADIOACTIVE SEED IMPLANT N/A 02/24/2018   Procedure: RADIOACTIVE SEED IMPLANT/BRACHYTHERAPY IMPLANT;  Surgeon: Nieves Cough, MD;  Location: [facility];  Service: Urology;  Laterality: N/A;    RIGHT HEART CATH N/A 11/22/2021   Procedure: RIGHT HEART CATH;  Surgeon: Burnard Debby LABOR, MD;  Location: [facility];  Service: Cardiovascular;  Laterality: N/A;   SAVORY DILATION N/A 03/12/2022   Procedure: SAVORY DILATION;  Surgeon: Rollin Dover, MD;  Location: [facility];  Service: Gastroenterology;  Laterality: N/A;   SAVORY DILATION N/A 07/12/2022   Procedure: SAVORY DILATION;  Surgeon: Rollin Dover, MD;  Location: [facility];  Service: Gastroenterology;  Laterality: N/A;   SPACE OAR INSTILLATION N/A 02/24/2018   Procedure: SPACE OAR INSTILLATION;  Surgeon: Nieves Cough, MD;  Location: [facility];  Service: Urology;  Laterality: N/A;    Current Medications: Outpatient Medications Prior to Visit  Medication Sig Dispense Refill   aspirin  EC 81 MG tablet Take 81 mg by mouth daily. Swallow whole.     darolutamide (NUBEQA) 300 MG tablet Take 300 mg by mouth 2 (two) times daily with a meal.     metoprolol  succinate (TOPROL -XL) 50 MG 24 hr tablet TAKE 1 TABLET BY MOUTH DAILY WITH OR IMMEDIATELY FOLLOWING A MEAL 90 tablet 1   OXYGEN Inhale 1-2 L into the lungs continuous.     pantoprazole  (PROTONIX ) 40 MG tablet Take 1 tablet (40 mg total) by mouth 2 (two) times daily. (Patient taking differently: Take 40 mg by mouth daily.) 30 tablet 2   Pirfenidone  267 MG CAPS Take 534 mg by mouth 2 (two) times daily.     pregabalin (LYRICA) 50 MG capsule Take 50 mg by mouth 2 (two) times daily. Patient takes 100 mg at night and 50 mg daily     rosuvastatin  (CRESTOR ) 10 MG tablet TAKE 1 TABLET BY MOUTH DAILY 90 tablet 3   Multiple Vitamin (MULTIVITAMIN WITH MINERALS) TABS tablet Take 1 tablet by mouth daily.     Pirfenidone  267 MG TABS Take 2 tablets (534 mg) by mouth in the morning, at noon, and at bedtime. **please dispense 3 month supply of TABLETS if possible** 540 tablet 0   No facility-administered medications prior to visit.     Allergies:   Beef-derived drug products,  Chicken protein, Egg-derived products, Fish-derived products, Meat extract, Ofev  [nintedanib ], and Pork-derived products   Social History   Socioeconomic History   Marital status: Married    Spouse name: Not on file   Number of children: 3   Years of education: Masters   Highest education level: Not on file  Occupational History   Occupation: Armed forces technical officer    Comment: retired  Tobacco Use   Smoking status: Never   Smokeless tobacco: Never  Vaping Use   Vaping status: Never Used  Substance and Sexual Activity   Alcohol use: No   Drug use: No   Sexual activity: Yes  Other Topics Concern   Not on file  Social History Narrative   Lives at home with his family.   Right-handed.   No caffeine use.   One daughter and two sons.   Social Drivers of Corporate investment banker Strain: Not on file  Food Insecurity: No Food Insecurity (01/07/2022)   Hunger Vital Sign    Worried About Running Out of Food in the Last Year: Never true    Ran Out of Food in the Last Year: Never true  Transportation Needs: No Transportation Needs (01/07/2022)   PRAPARE - Administrator, Civil Service (Medical): No    Lack of Transportation (Non-Medical): No  Physical Activity: Not on file  Stress: Not on file  Social Connections: Unknown (02/11/2023)   Received from [facility]   Social Network    Social Network: Not on file     He is originally from [place], [place].  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  Prior to COVID he traveled extensively.  He is married for 59 years.  He has 3 children.  There is no Bacot use or alcohol use.  He is semiretired.  Family History:  The patient's family history includes Other in his father and mother.   His mother died at age 86.  Father died at age 41  and had pneumonia and fever.  He has a brother living at 50.  He has 1 living sister who is 19.  2 sisters are deceased 1 at age 67 after a fall fracture and another at age  65.  ROS General: Negative; No fevers, chills, or night sweats;  HEENT: Negative; No changes in vision or hearing, sinus congestion, difficulty swallowing Pulmonary: Idiopathic pulmonary fibrosis Cardiovascular: See HPI GI: Negative; No nausea, vomiting, diarrhea, or abdominal pain GU: Negative; No dysuria, hematuria, or difficulty voiding Musculoskeletal: Negative; no myalgias, joint pain, or weakness Hematologic/Oncology: Negative; no easy bruising, bleeding Endocrine: Negative; no heat/cold intolerance; no diabetes Neuro: Negative; no changes in balance, headaches Skin: Negative; No rashes or skin lesions Psychiatric: Negative; No behavioral problems, depression Sleep: Negative; No snoring, daytime sleepiness, hypersomnolence, bruxism, restless legs, hypnogognic hallucinations, no cataplexy Other comprehensive 14 point system review is negative.   PHYSICAL EXAM:   VS:  BP 110/66   Pulse 69   Ht 5' 6 (1.676 m)   Wt 105 lb 9.6 oz (47.9 kg)   SpO2 95%   BMI 17.04 kg/m     Repeat blood pressure by me was 114/64  Wt Readings from Last 3 Encounters:  10/21/23 105 lb 9.6 oz (47.9 kg)  09/18/23 104 lb (47.2 kg)  03/06/23 104 lb (47.2 kg)   General: Alert, oriented, no distress.  Skin: normal turgor, no rashes, warm and dry HEENT: Normocephalic, atraumatic. Pupils equal round and reactive to light; sclera anicteric; extraocular muscles intact;  Nose without nasal septal hypertrophy Mouth/Parynx benign; Mallinpatti scale 3 Neck: No JVD, no carotid bruits; normal carotid upstroke Lungs: clear to ausculatation and percussion; no wheezing or rales Chest wall: without tenderness to palpitation Heart: PMI not displaced, RRR, s1 s2 normal, 1/6 systolic murmur, no diastolic murmur, no rubs, gallops, thrills, or heaves Abdomen: soft, nontender; no hepatosplenomehaly, BS+; abdominal aorta nontender and not dilated by palpation. Back: no CVA tenderness Pulses 2+ Musculoskeletal: full  range of motion, normal strength, no joint deformities Extremities: no clubbing cyanosis or edema, Homan's sign negative  Neurologic: grossly nonfocal; Cranial nerves grossly wnl Psychologic: Normal mood and affect    Studies/Labs Reviewed:    EKG Interpretation Date/Time:  Tuesday October 21 2023 13:55:37 EDT Ventricular Rate:  69 PR Interval:  184 QRS Duration:  70 QT Interval:  396 QTC Calculation: 424 R Axis:   -56  Text Interpretation: Normal sinus rhythm Left axis deviation When compared with ECG of 21-Oct-2022 16:30, QRS axis Shifted left Criteria for Septal infarct are no longer Present Confirmed by Burnard Ned ([id number]) on 10/21/2023 2:21:51 PM   October 21, 2022 ECG (independently read by me): Normal sinus rhythm at 60.  Possible left atrial enlargement.  The  November 14, 2021 ECG (independently read by me): NSR at 70, no ectopy, normal intervals  July 12, 2021 ECG (independently read by me): NSR at 71, possible LA enlargement  February 05, 2021 ECG (independently read by me): NSR at 82, Possible LA enlargement  November 29, 2020 ECG (independently read by me): Sinus tachycardia at 108; possible LAE, no ectopy  Recent Labs:    Latest Ref Rng & Units 10/25/2022   10:18 AM 05/10/2022    3:42 PM 04/02/2022   10:59 AM  BMP  Glucose 70 - 99 mg/dL  877  862   BUN 8 - 27 mg/dL  11  9   Creatinine 9.38 - 1.24 mg/dL 8.99  9.14  9.20   BUN/Creat Ratio 10 -  24  13    Sodium 134 - 144 mmol/L  134  136   Potassium 3.5 - 5.2 mmol/L  4.6  3.7   Chloride 96 - 106 mmol/L  96  100   CO2 20 - 29 mmol/L  26  31   Calcium  8.6 - 10.2 mg/dL  9.6  9.4         Latest Ref Rng & Units 01/23/2023   11:37 AM 09/12/2022    4:32 PM 05/10/2022    3:42 PM  Hepatic Function  Total Protein 6.0 - 8.3 g/dL 8.4  7.6  7.7   Albumin 3.5 - 5.2 g/dL 4.0  3.6  3.8   AST 0 - 37 U/L 20  25  22    ALT 0 - 53 U/L 13  18  10    Alk Phosphatase 39 - 117 U/L 67  52  63   Total Bilirubin 0.2 - 1.2 mg/dL 0.4  0.3  0.3    Bilirubin, Direct 0.0 - 0.3 mg/dL 0.0  0.1         Latest Ref Rng & Units 05/10/2022    3:42 PM 04/02/2022   10:59 AM 01/28/2022    7:06 AM  CBC  WBC 3.4 - 10.8 x10E3/uL 8.4  9.8  7.1   Hemoglobin 13.0 - 17.7 g/dL 86.8  87.7  87.9   Hematocrit 37.5 - 51.0 % 39.0  35.2  33.8   Platelets 150 - 450 x10E3/uL 231  229.0  127    Lab Results  Component Value Date   MCV 94 05/10/2022   MCV 95.3 04/02/2022   MCV 94.2 01/28/2022   Lab Results  Component Value Date   TSH 9.910 (H) 07/09/2022   Lab Results  Component Value Date   HGBA1C 5.8 (H) 01/01/2022     BNP No results found for: BNP  ProBNP    Component Value Date/Time   PROBNP 199.0 (H) 04/02/2022 1059     Lipid Panel     Component Value Date/Time   CHOL 203 (H) 11/30/2020 0843   TRIG 70 01/07/2022 0500   HDL 60 11/30/2020 0843   CHOLHDL 3.4 11/30/2020 0843   LDLCALC 129 (H) 11/30/2020 0843   LDLDIRECT 70 05/10/2022 1542   LABVLDL 14 11/30/2020 0843     RADIOLOGY: No results found.   Additional studies/ records that were reviewed today include:   ECHO: 09/07/2020 IMPRESSIONS   1. Left ventricular ejection fraction, by estimation, is 60 to 65%. The  left ventricle has normal function. The left ventricle has no regional  wall motion abnormalities. Left ventricular diastolic parameters are  indeterminate.   2. Right ventricular systolic function is normal. The right ventricular  size is normal. There is mildly elevated pulmonary artery systolic  pressure. The estimated right ventricular systolic pressure is 34.8 mmHg.   3. The mitral valve is normal in structure. No evidence of mitral valve  regurgitation.   4. Tricuspid valve regurgitation is mild to moderate.   5. The aortic valve is tricuspid. Aortic valve regurgitation is not  visualized. No aortic stenosis is present.   6. The inferior vena cava is normal in size with greater than 50%  respiratory variability, suggesting right atrial pressure of  3 mmHg.    CT chest high-resolution: January 13, 2020 IMPRESSION: 1. The appearance of the lungs is compatible with progressive interstitial lung disease categorized as probable usual interstitial pneumonia (UIP) per current ATS guidelines. 2. Aortic atherosclerosis, in addition to left main  and 2 vessel coronary artery disease. In addition, there is mild aneurysmal dilatation of the ascending thoracic aorta (4.5 cm in diameter). Ascending thoracic aortic aneurysm. Recommend semi-annual imaging followup by CTA or MRA and referral to cardiothoracic surgery if not already obtained. This recommendation follows 2010 ACCF/AHA/AATS/ACR/ASA/SCA/SCAI/SIR/STS/SVM Guidelines for the Diagnosis and Management of Patients With Thoracic Aortic Disease. Circulation. 2010; 121: Z733-z630. Aortic aneurysm NOS (ICD10-I71.9).   Aortic Atherosclerosis (ICD10-I70.0). Aortic aneurysm NOS (ICD10-I71.9).   Coronary CTA: 12/08/2020 FINDINGS: Image quality: Adequate.   Noise artifact is: Mild misregistration.   Coronary calcium  score is 18, which places the patient in the 9th percentile for age and sex matched control. Comparison made to white males.   Coronary arteries: Normal coronary origins.  Right dominance.   Right Coronary Artery: Minimal atherosclerotic plaque in the mid RCA, <25% stenosis.   Left Main Coronary Artery: Minimal atherosclerotic plaque in the distal LMCA, <25% stenosis.   Left Anterior Descending Coronary Artery: Mild atherosclerotic plaque at the ostial LAD, 25-49% stenosis. Mild tubular atherosclerotic plaque in the proximal LAD, 25-49% stenosis, but appears to be approaching moderate stenosis.   Left Circumflex Artery: Minimal atherosclerotic plaque in the proximal L circumflex artery, <25% stenosis.   Aorta:   Measurements made in double oblique technique, sinus to sinus:   Sinus of Valsalva:   R-L: 38 mm   L-Non: 37 mm   R-Non: 35 mm   Mid ascending aorta  (at PA bifurcation): 43 mm, mild dilation of ascending aorta.   Mid descending aorta (at level of pulmonary veins): 21 mm   Aortic Valve: Tricuspid aortic valve, no calcifications.   Other findings:   Normal pulmonary vein drainage into the left atrium.   Normal left atrial appendage without a thrombus.   Normal size of the pulmonary artery.   IMPRESSION: 1. Mild CAD in ostial and proximal LAD, CADRADS = 2.   2. Coronary calcium  score is 18, which places the patient in the 9th percentile for age and sex matched control.   3. Normal coronary origin with right dominance.   4. Mild dilation of ascending thoracic aorta, 43 mm at mid ascending aorta.   5.  Tricuspid aortic valve    ECHO: 10/10/2021  1. Left ventricular ejection fraction, by estimation, is 65 to 70%. The  left ventricle has normal function. The left ventricle has no regional  wall motion abnormalities. Left ventricular diastolic parameters are  consistent with Grade I diastolic  dysfunction (impaired relaxation).   2. Right ventricular systolic function is normal. The right ventricular  size is normal. There is mildly elevated pulmonary artery systolic  pressure. The estimated right ventricular systolic pressure is 39.2 mmHg.   3. The mitral valve is grossly normal. Trivial mitral valve  regurgitation.   4. The aortic valve is tricuspid. Aortic valve regurgitation is not  visualized.   5. The inferior vena cava is normal in size with greater than 50%  respiratory variability, suggesting right atrial pressure of 3 mmHg.   Comparison(s): Changes from prior study are noted. 09/07/2020: LVDF 60-65%,  RVSP 34.8 mmHg.    CT CHEST: 04/24/2022 IMPRESSION: 1. The appearance of the lungs remains compatible with interstitial lung disease, considered diagnostic of usual interstitial pneumonia (UIP), per current ATS guidelines, as above, demonstrating mild progression compared to prior studies. 2. Aortic  atherosclerosis, in addition to left main and three-vessel coronary artery disease. 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 Ascending thoracic aortic  aneurysm. Recommend semi-annual imaging followup by CTA or MRA and referral to cardiothoracic surgery if not already obtained. This recommendation follows 2010 ACCF/AHA/AATS/ACR/ASA/SCA/SCAI/SIR/STS/SVM Guidelines for the Diagnosis and Management of Patients With Thoracic Aortic Disease. Circulation. 2010; 121: [id number]. Aortic aneurysm NOS (ICD10-I71.9).   Aortic Atherosclerosis (ICD10-I70.0). Aortic aneurysm NOS (ICD10-I71.9).  CT ANGIO CHEST AORTA: 07/08/2023 IMPRESSION: Vascular: 1. Similar appearing fusiform ascending thoracic aortic aneurysm measuring up to 4.3 cm. Recommend annual imaging followup by CTA or MRA. This recommendation follows 2010 ACCF/AHA/AATS/ACR/ASA/SCA/SCAI/SIR/STS/SVM Guidelines for the Diagnosis and Management of Patients with Thoracic Aortic Disease. Circulation. 2010; 121: [id number]. Aortic aneurysm NOS (ICD10-I71.9) 2.  Aortic Atherosclerosis (ICD10-I70.0).   Non-Vascular: 1. No acute intrathoracic abnormality. 2. Similar appearing chronic changes of UIP. 3. Incidentally noted 4 mm left upper pole renal calculus.  ASSESSMENT:    1. Aneurysm of ascending aorta without rupture (HCC)   2. Atrial tachycardia (HCC)   3. Idiopathic pulmonary fibrosis (HCC)   4. Calcification of coronary artery   5. Hyperlipidemia LDL goal <70   6. Mild CAD     PLAN:  Mr.Kirin Kloepfer  is a young appearing 83 year-old gentleman who is originally from [place] and has lived in the [place]  for the last 52 years.  He has had issues with chronic cough as well as weight loss.  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 Geronimo.  Remotely, he was on nintedanib  but due to some progression he is on pirfenidone .  He has  been documented to have mild dilation of his ascending aorta in addition to atherosclerosis involving his left main into coronary vessels.  Coronary CTA showed only mild coronary calcification with a score of 18 representing only 9% for age and sex matched control.  There was mild coronary plaque at the ostium and proximal LAD with minimal plaque elsewhere.  He has been treated with lipid-lowering therapy and laboratory in September 2022 showed an LDL at 28 he had recently been reevaluated by Dr. Geronimo.  Due to concerns on his 2D echo Doppler study that RV systolic pressure had risen to 39.2 I performed right heart cardiac catheterization in July 2023.  This showed only very mild the increased PA systolic pressure at 30 mm.  He subsequently went to [place] and then also on a cruise and developed COVID in early September 2023.  He required extensive hospitalization for respiratory failure requiring intubation and had a protracted hospitalization and subsequent rehab for approximately 1 month.  He has been documented to have slight progression of his ascending aortic aneurysm which measured 4.5 cm long high-resolution chest CT on April 27, 2022.  He tells me he was evaluated by Dr. Kerrin and 68-month follow-up is CT imaging was recommended.  He was also noted to have aortic atherosclerosis as well as coronary calcification of the left main, LAD, and circumflex vessel.  Presently, he continues to feel well.  His most recent CT angio of his chest was not significantly changed and showed 4.3 cm fusiform ascending thoracic aortic aneurysm.  He was noted to have aortic atherosclerosis.  He was also incidentally noted to have 4 mm left upper pole renal calculus.  He continues to be followed by Dr. Geronimo.  He is unaware of any recurrent tachycardic issues and he is on metoprolol  succinate 50 mg daily.  He was wondering if he could reduce this dose.  I spoke with his son over the telephone today.  On the reduced  dose at 50 mg he has not had any breakthrough atrial tachycardia.  His ECG shows sinus rhythm at 69 and he is not bradycardic.  I suggested he can continue the current dose as prescribed but if he wishes to reduce the dose I would initially try 50 mg alternating with 25 mg every other day.  He is also concerned about aspirin .  I have recommended that at his next blood draw he have an LP(a) obtained.  He does have coronary and aortic calcification.  If LP(a) is present I would not discontinue aspirin .  He is aware of my imminent retirement.  I have recommended he undergo a 1 year follow-up CT angio of his aorta and chest in March 2026.  I will transition him to the care of Dr. Ladona with follow-up in March 2026 after his CT evaluation.  Medication Adjustments/Labs and Tests Ordered: Current medicines are reviewed at length with the patient today.  Concerns regarding medicines are outlined above.  Medication changes, Labs and Tests ordered today are listed in the Patient Instructions below. Patient Instructions  Medication Instructions:  Your physician recommends that you continue on your current medications as directed. Please refer to the Current Medication list given to you today.  *If you need a refill on your cardiac medications before your next appointment, please call your pharmacy*  Lab Work: Your physician recommends that you have labs drawn today: LP(a)  If you have labs (blood work) drawn today and your tests are completely normal, you will receive your results only by: MyChart Message (if you have MyChart) OR A paper copy in the mail If you have any lab test that is abnormal or we need to change your treatment, we will call you to review the results.  Testing/Procedures: Non-Cardiac CT Angiography (CTA) chest/aorta, is a special type of CT scan that uses a computer to produce multi-dimensional views of major blood vessels throughout the body. In CT angiography, a contrast material is  injected through an IV to help visualize the blood vessels **To do in March 2026**  Follow-Up: At [facility], you and your health needs are our priority.  As part of our continuing mission to provide you with exceptional heart care, our providers are all part of one team.  This team includes your primary Cardiologist (physician) and Advanced Practice Providers or APPs (Physician Assistants and Nurse Practitioners) who all work together to provide you with the care you need, when you need it.  Your next appointment:   10-12 month(s)  Provider:   Dr. Gordy Ladona   We recommend signing up for the patient portal called MyChart.  Sign up information is provided on this After Visit Summary.  MyChart is used to connect with patients for Virtual Visits (Telemedicine).  Patients are able to view lab/test results, encounter notes, upcoming appointments, etc.  Non-urgent messages can be sent to your provider as well.   To learn more about what you can do with MyChart, go to [email or web address].    Signed, Debby Sor, MD  10/21/2023 5:46 PM    [facility] [address], [address], [place], [place]  [address] Phone: [phone]

## 2023-10-21 NOTE — Patient Instructions (Signed)
 Medication Instructions:  Your physician recommends that you continue on your current medications as directed. Please refer to the Current Medication list given to you today.  *If you need a refill on your cardiac medications before your next appointment, please call your pharmacy*  Lab Work: Your physician recommends that you have labs drawn today: LP(a)  If you have labs (blood work) drawn today and your tests are completely normal, you will receive your results only by: MyChart Message (if you have MyChart) OR A paper copy in the mail If you have any lab test that is abnormal or we need to change your treatment, we will call you to review the results.  Testing/Procedures: Non-Cardiac CT Angiography (CTA) chest/aorta, is a special type of CT scan that uses a computer to produce multi-dimensional views of major blood vessels throughout the body. In CT angiography, a contrast material is injected through an IV to help visualize the blood vessels **To do in March 2026**  Follow-Up: At [facility], you and your health needs are our priority.  As part of our continuing mission to provide you with exceptional heart care, our providers are all part of one team.  This team includes your primary Cardiologist (physician) and Advanced Practice Providers or APPs (Physician Assistants and Nurse Practitioners) who all work together to provide you with the care you need, when you need it.  Your next appointment:   10-12 month(s)  Provider:   Dr. Gordy Bergamo   We recommend signing up for the patient portal called MyChart.  Sign up information is provided on this After Visit Summary.  MyChart is used to connect with patients for Virtual Visits (Telemedicine).  Patients are able to view lab/test results, encounter notes, upcoming appointments, etc.  Non-urgent messages can be sent to your provider as well.   To learn more about what you can do with MyChart, go to [email or web address].

## 2023-10-23 ENCOUNTER — Other Ambulatory Visit (HOSPITAL_COMMUNITY): Payer: Self-pay

## 2023-10-23 ENCOUNTER — Other Ambulatory Visit: Payer: Self-pay | Admitting: Internal Medicine

## 2023-10-23 ENCOUNTER — Ambulatory Visit: Payer: Self-pay | Admitting: *Deleted

## 2023-10-23 DIAGNOSIS — J84112 Idiopathic pulmonary fibrosis: Secondary | ICD-10-CM

## 2023-10-23 LAB — LIPOPROTEIN A (LPA): Lipoprotein (a): 32.7 nmol/L (ref <75.0)

## 2023-10-24 ENCOUNTER — Other Ambulatory Visit: Payer: Self-pay

## 2023-10-24 MED ORDER — PIRFENIDONE 267 MG PO TABS
ORAL_TABLET | ORAL | 1 refills | Status: DC
  Filled 2023-10-24: qty 540, fill #0
  Filled 2023-10-27: qty 180, 30d supply, fill #0
  Filled 2023-11-20: qty 180, 30d supply, fill #1
  Filled 2023-12-18 – 2023-12-19 (×2): qty 180, 30d supply, fill #2
  Filled 2024-01-19: qty 180, 30d supply, fill #3

## 2023-10-27 ENCOUNTER — Other Ambulatory Visit: Payer: Self-pay

## 2023-10-27 NOTE — Progress Notes (Signed)
 Specialty Pharmacy Refill Coordination Note  Jeff Willis is a 83 y.o. male contacted today regarding refills of specialty medication(s) Pirfenidone    Patient requested Delivery   Delivery date: 10/29/23   Verified address: [address]  [place] [address]   Medication will be filled on 10/28/23

## 2023-10-27 NOTE — Telephone Encounter (Signed)
 According to dr [facility] office note, the aspirin  can be stopped.

## 2023-11-20 ENCOUNTER — Other Ambulatory Visit: Payer: Self-pay

## 2023-11-24 ENCOUNTER — Other Ambulatory Visit: Payer: Self-pay

## 2023-11-24 NOTE — Progress Notes (Signed)
 Specialty Pharmacy Refill Coordination Note  Jeff Willis is a 83 y.o. male. Patients son Ripley was contacted today regarding refills of specialty medication(s) Pirfenidone    Patient requested Delivery   Delivery date: 11/26/23   Verified address: [address]  [place] [address]   Medication will be filled on 11/25/23.

## 2023-12-15 ENCOUNTER — Other Ambulatory Visit: Payer: Self-pay

## 2023-12-18 ENCOUNTER — Other Ambulatory Visit: Payer: Self-pay

## 2023-12-19 ENCOUNTER — Other Ambulatory Visit (HOSPITAL_COMMUNITY): Payer: Self-pay

## 2023-12-23 ENCOUNTER — Other Ambulatory Visit: Payer: Self-pay | Admitting: Pharmacy Technician

## 2023-12-23 ENCOUNTER — Other Ambulatory Visit: Payer: Self-pay

## 2023-12-23 NOTE — Progress Notes (Signed)
 Specialty Pharmacy Refill Coordination Note  Jeff Willis is a 83 y.o. male contacted today regarding refills of specialty medication(s) Pirfenidone    Patient requested Delivery   Delivery date: 12/24/23   Verified address: [address]   [place] [address]   Medication will be filled on 12/23/23.

## 2024-01-15 ENCOUNTER — Other Ambulatory Visit: Payer: Self-pay

## 2024-01-15 ENCOUNTER — Other Ambulatory Visit (HOSPITAL_COMMUNITY): Payer: Self-pay

## 2024-01-19 ENCOUNTER — Encounter: Payer: Self-pay | Admitting: Physician Assistant

## 2024-01-19 ENCOUNTER — Ambulatory Visit (INDEPENDENT_AMBULATORY_CARE_PROVIDER_SITE_OTHER): Admitting: Physician Assistant

## 2024-01-19 ENCOUNTER — Other Ambulatory Visit (HOSPITAL_COMMUNITY): Payer: Self-pay

## 2024-01-19 VITALS — BP 113/70 | HR 68

## 2024-01-19 DIAGNOSIS — L309 Dermatitis, unspecified: Secondary | ICD-10-CM | POA: Diagnosis not present

## 2024-01-19 IMAGING — RF DG ESOPHAGUS
14 series · 14 of 24 positions shown · non-contrast
Comparison: 01/19/2021 esophagram.

CLINICAL DATA: Dysphagia with excessive throat clearing.

EXAM:
ESOPHOGRAM / BARIUM SWALLOW / BARIUM TABLET STUDY
TECHNIQUE: Combined double contrast and single contrast examination performed
using effervescent crystals, thick barium liquid, and thin barium
liquid. The patient was observed with fluoroscopy swallowing a 13 mm
barium sulphate tablet.
FLUOROSCOPY:
Radiation Exposure Index (as provided by the fluoroscopic device):
17.2 mGy Kerma

[Series 1: sequence · 1 of 40 frames shown (1 of 9)]
[frame 4/40]
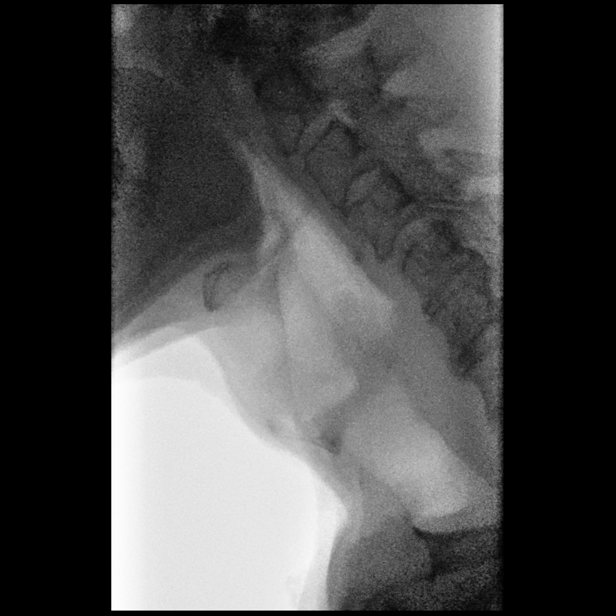

[Series 2: one shot · 0.15mm/px · 1 of 1 slices shown (1 of 5)]
[im 1/1]
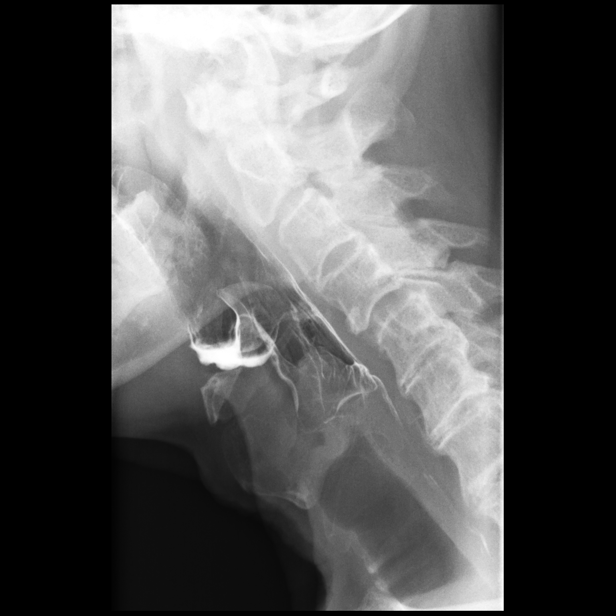

[Series 3: sequence · 1 of 30 frames shown (2 of 9)]
[frame 27/30]
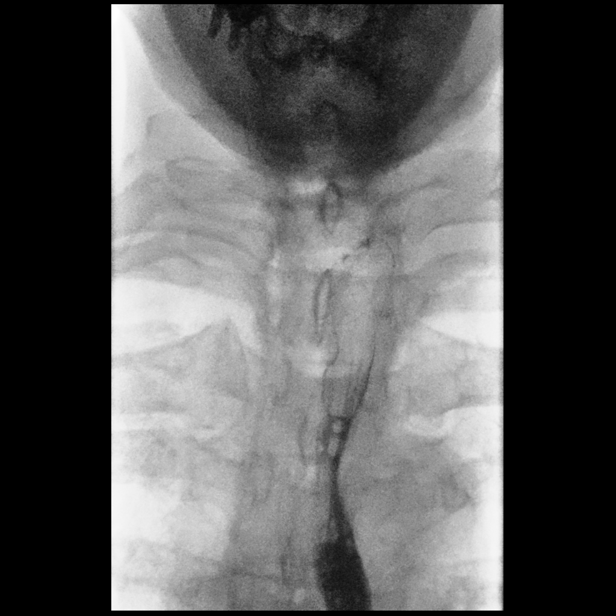

[Series 4: sequence · 1 of 20 frames shown (3 of 9)]
[frame 18/20]
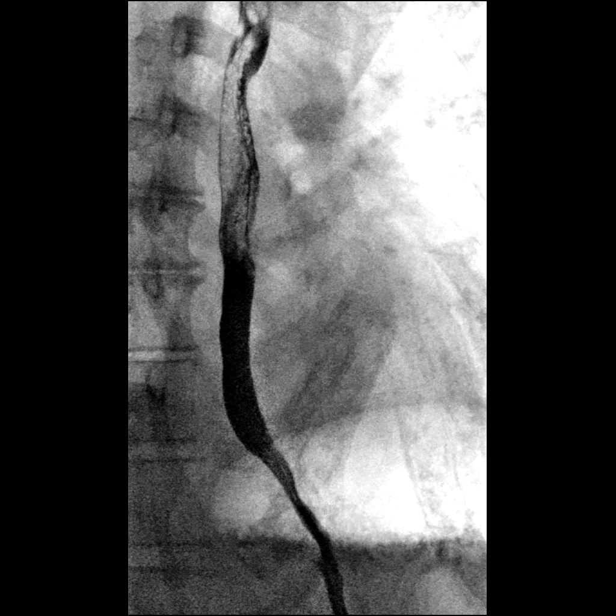

[Series 5: one shot · 0.15mm/px · 1 of 5 slices shown (2 of 5)]
[im 2/5]
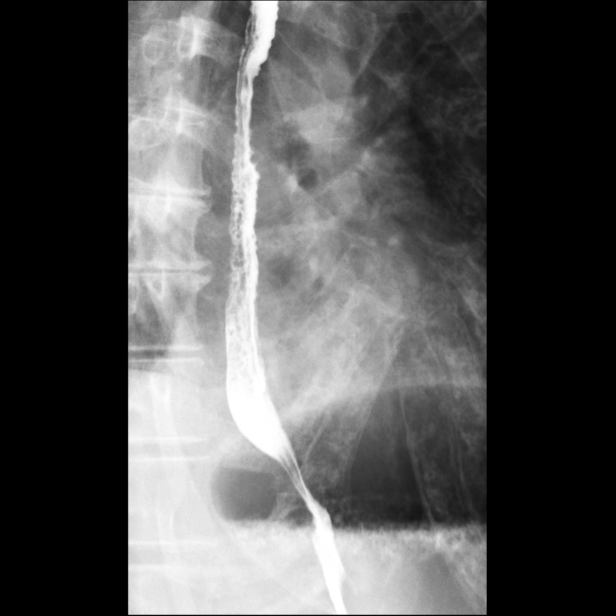

[Series 6: sequence · 1 of 26 frames shown (4 of 9)]
[frame 4/26]
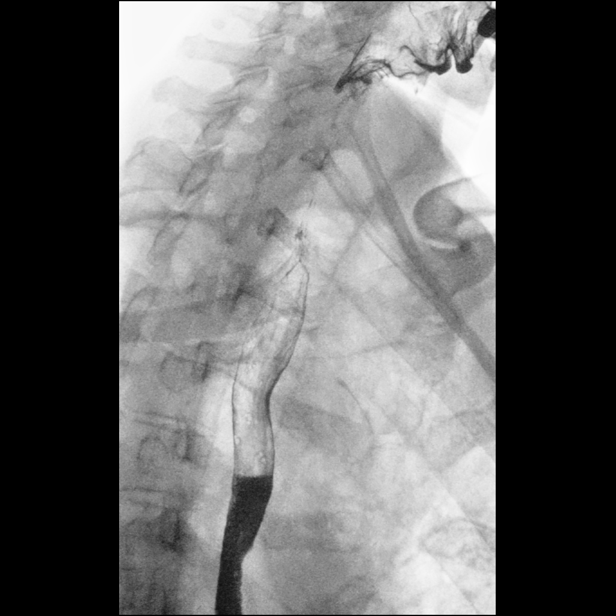

[Series 7: one shot · 0.15mm/px · 1 of 1 slices shown (3 of 5)]
[im 1/1]
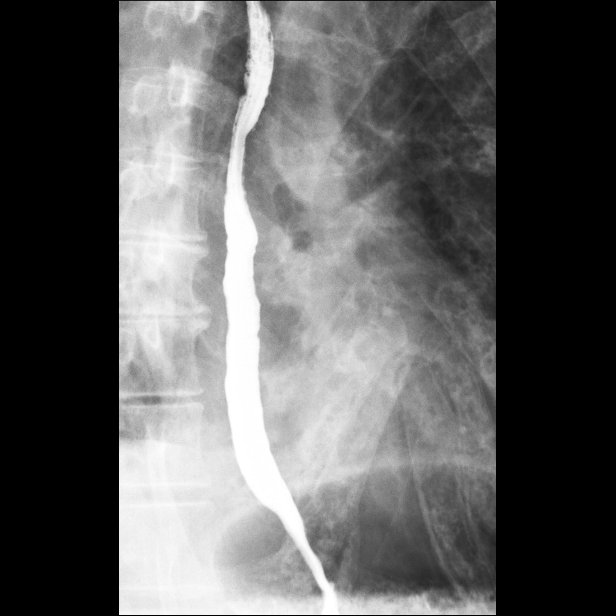

[Series 8: sequence · 1 of 40 frames shown (5 of 9)]
[frame 7/40]
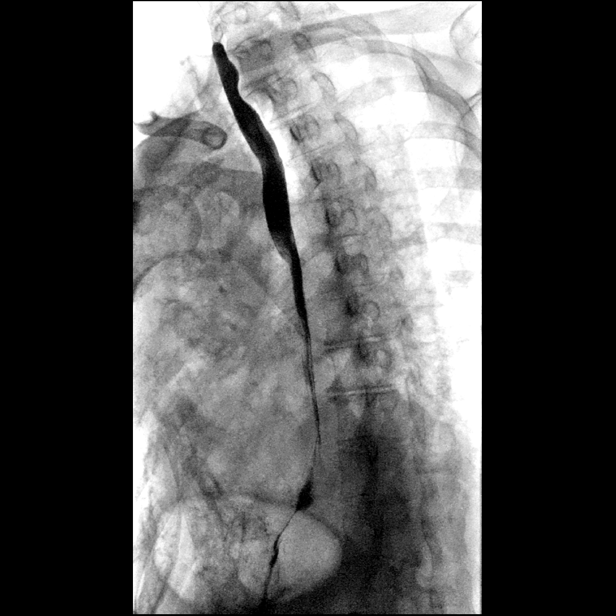

[Series 9: sequence · 1 of 107 frames shown (6 of 9)]
[frame 17/107]
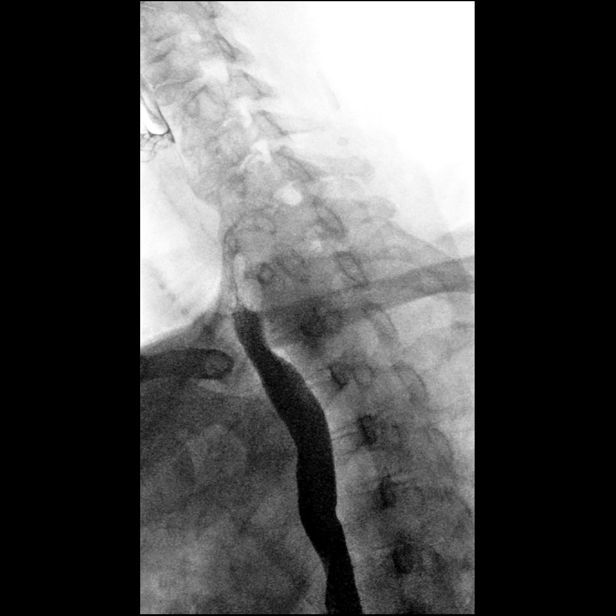

[Series 10: one shot · 1 of 1 slices shown (4 of 5)]
[im 1/1]
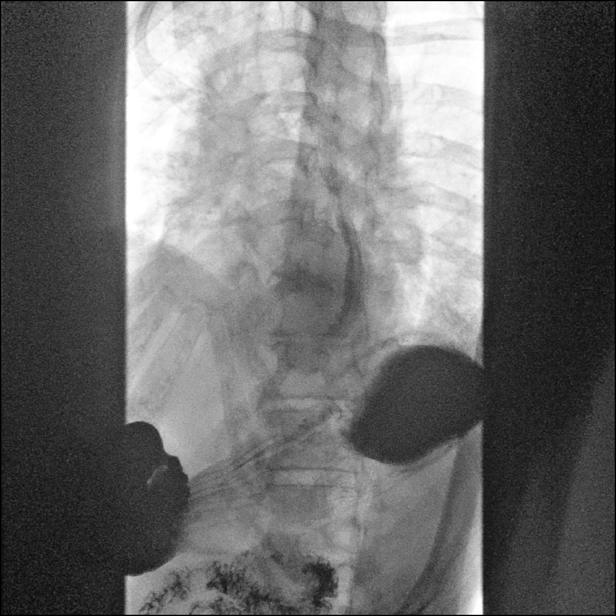

[Series 11: sequence · 1 of 112 frames shown (7 of 9)]
[frame 96/112]
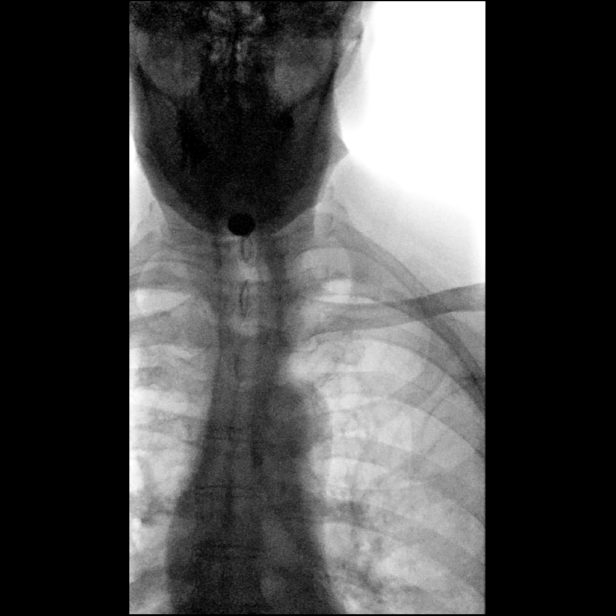

[Series 13: sequence · 1 of 25 frames shown (8 of 9)]
[frame 4/25]
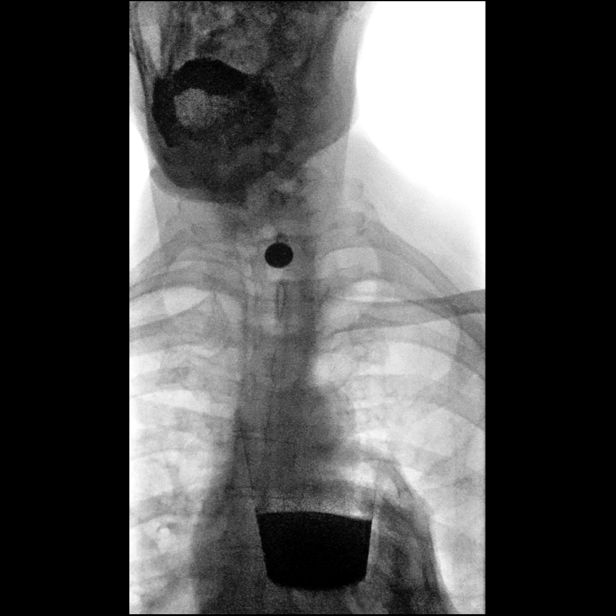

[Series 14: sequence · 1 of 33 frames shown (9 of 9)]
[frame 3/33]
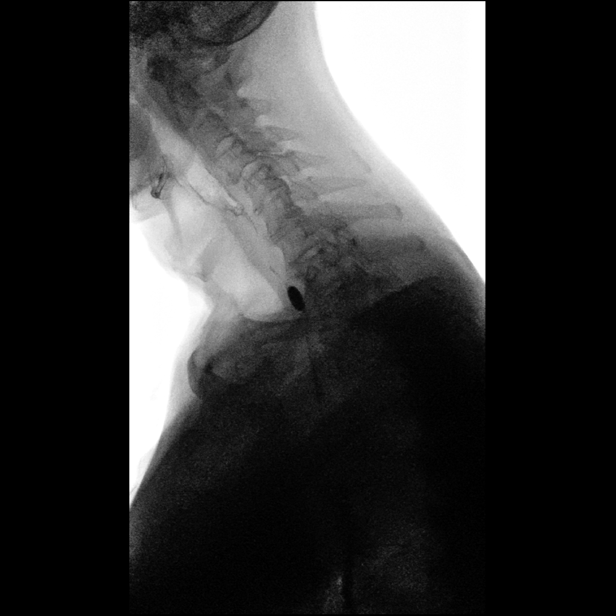

[Series 15: one shot · 1 of 1 slices shown (5 of 5)]
[im 1/1]
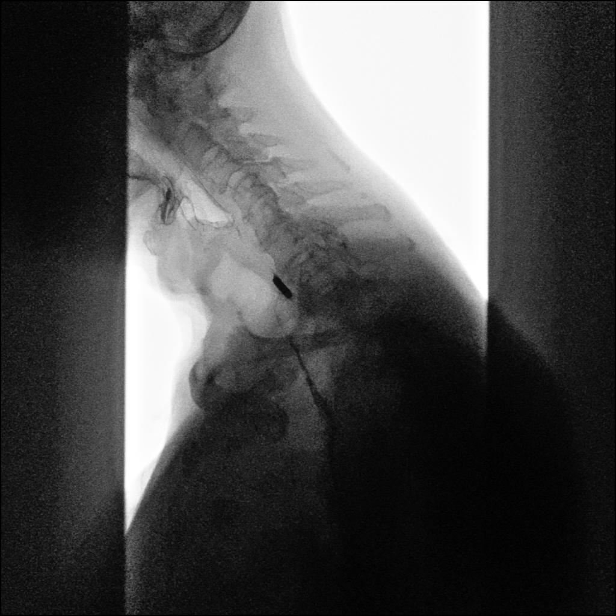

[14 of 24 positions shown; findings below may reference images not displayed]

FINDINGS: No laryngeal penetration or tracheobronchial aspiration. Mild barium
retention in the vallecula. No evidence of pharyngeal mass,
stricture or diverticulum. No significant cricopharyngeus muscle
dysfunction.

As on the 01/19/2021 esophagram study, the barium tablet became
lodged in the mid cervical esophagus and did not clear into the
thoracic esophagus or stomach despite multiple water and barium
swallows. There is slight puckering of the right cervical esophageal
luminal contour at this level, unchanged from prior, again
suggestive of a stricture due to cervical web. No discrete mass or
ulcer in this location.

No hiatal hernia. No gastroesophageal reflux elicited. Normal
thoracic esophageal mucosa with no evidence of thoracic esophageal
mass, ulcer or stricture. Mild esophageal dysmotility, characterized
predominantly by proximal escape of.
IMPRESSION: 1. Persistent finding of barium tablet becoming lodged in the mid
cervical esophagus, suspected to be due to a cervical web in this
location, unchanged from 01/20/2019 esophagram study. No discrete
mass lesion.
2. Mild esophageal dysmotility, with a presbyesophagus pattern.
3. Mild barium retention in the vallecula. No laryngeal penetration
or tracheobronchial aspiration.
4. Otherwise normal esophagram.

## 2024-01-19 MED ORDER — CLOBETASOL PROPIONATE 0.05 % EX OINT: TOPICAL_OINTMENT | CUTANEOUS | 2 refills | Status: AC

## 2024-01-19 MED ORDER — CLOBETASOL PROPIONATE 0.05 % EX OINT: TOPICAL_OINTMENT | CUTANEOUS | 2 refills | Status: DC

## 2024-01-19 NOTE — Patient Instructions (Signed)

## 2024-01-19 NOTE — Progress Notes (Signed)
   New Patient Visit   Subjective  Jeff Willis is a 83 y.o. male NEW PATIENT who presents for the following: Hyperpigmentation  Patient states she has hyperpigmentation located at the neck that she would like to have examined. Patient reports the areas have been there for 2 months. He reports the areas are not bothersome. She states that the areas have not spread. Patient reports he has not previously been treated for these areas. He has tried betamethasone dipropionate gentamicin and zinc  sulphate cream and another medication from [place]      The following portions of the chart were reviewed this encounter and updated as appropriate: medications, allergies, medical history  Review of Systems:  No other skin or systemic complaints except as noted in HPI or Assessment and Plan.  Objective  Well appearing patient in no apparent distress; mood and affect are within normal limits.  A focused examination was performed of the following areas: Neck  Relevant exam findings are noted in the Assessment and Plan.              Assessment & Plan   Unspecified Dermatitis  Exam: Hyperpigmented patches   Treatment Plan: - start topical steroids as directed  - f/u in 3-4 weeks (before he leaves for 4 months to [place])  - if no better then, punch biopsy may be warranted    DERMATITIS, UNSPECIFIED   Related Medications clobetasol  ointment (TEMOVATE ) 0.05 % Apply twice daily everyday for 6 weeks  Return in about 3 weeks (around 02/09/2024) for hyperpidmentation follow up .  I, Doyce Pan, CMA, am acting as scribe for Zandrea Kenealy K, PA-C.   Documentation: I have reviewed the above documentation for accuracy and completeness, and I agree with the above.  Adylene Dlugosz K, PA-C

## 2024-01-20 ENCOUNTER — Other Ambulatory Visit: Payer: Self-pay

## 2024-01-20 ENCOUNTER — Other Ambulatory Visit (HOSPITAL_COMMUNITY): Payer: Self-pay

## 2024-01-20 NOTE — Progress Notes (Signed)
 Specialty Pharmacy Refill Coordination Note  Spoke with Borum,[name]   Jeff Willis is a 83 y.o. male contacted today regarding refills of specialty medication(s) Pirfenidone   Doses on hand: 6 (36 tablets)  Patient requested: Delivery   Delivery date: 01/23/24   Verified address: [address] [place] [address]  Medication will be filled on 01/22/23.    **Patient will be going out of town for 3 months starting in October. [name] requested a 3 month supply. Patient's son has been advised that we will need to reach out to insurance for a override since maximum day supply is 30 days.**

## 2024-01-21 ENCOUNTER — Other Ambulatory Visit: Payer: Self-pay

## 2024-01-21 ENCOUNTER — Ambulatory Visit: Admitting: Internal Medicine

## 2024-01-21 DIAGNOSIS — J84112 Idiopathic pulmonary fibrosis: Secondary | ICD-10-CM

## 2024-01-21 DIAGNOSIS — Z5181 Encounter for therapeutic drug level monitoring: Secondary | ICD-10-CM

## 2024-01-21 LAB — PULMONARY FUNCTION TEST
DL/VA % pred: 66 %
DL/VA: 2.85 ml/min/mmHg/L
DLCO cor % pred: 33 %
DLCO cor: 9.07 ml/min/mmHg
DLCO unc % pred: 33 %
DLCO unc: 9.07 ml/min/mmHg
FEF 25-75 Pre: 3.63 L/s
FEF2575-%Pred-Pre: 215 %
FEV1-%Pred-Pre: 86 %
FEV1-Pre: 1.99 L
FEV1FVC-%Pred-Pre: 130 %
FEV6-%Pred-Pre: 66 %
FEV6-Pre: 2.01 L
FEV6FVC-%Pred-Pre: 100 %
FVC-%Pred-Pre: 65 %
FVC-Pre: 2.01 L
Pre FEV1/FVC ratio: 99 %
Pre FEV6/FVC Ratio: 100 %

## 2024-01-21 NOTE — Progress Notes (Signed)
 Spirometry and diffusion capacity performed today.

## 2024-01-21 NOTE — Patient Instructions (Signed)
 IPF (idiopathic pulmonary fibrosis) (HCC) Chronic respiratory failure after COVID-19 related hospitalization summer 2023   -PFT Sept  2025 better than  OCt 2023 but worse than May 2023 - Noted covid (mild) in [place] in dec 2024 - Noted Normal liver functino test at at [facility] March 2025 and July 2025 - glad overall stable and well  Plan -continue esbriet  2 pills three times daily with food    - sent message to our pharmacist to give ryou 3 month travel supply - spirometry and March 2026 after [place] trip   Weight loss, non-intentional  -stable/impro    Plan -monitor for weight loss with esbriet  for now -Continue high-protein diet as below   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 Morna Cohens in April 2024 diagnosis irritable larynx - no issues on 09/18/2023 and.td   Plan -per ENT and GI       Follow-up  - March 2026 - 15 min visit after back from [place]   - symptom score and exercise hypoxemia test

## 2024-01-21 NOTE — Progress Notes (Unsigned)
 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   Prior cough in  2019 for several months  resolved on on some kind of powder inhaler x one week = Advair     History of Present Illness  08/04/2019  Pulmonary/ 1st office eval/[name]  Chief Complaint  Patient presents with   Pulmonary Consult    Referred by Dr Hugh. Pt c/o non prod cough and throat clearing x 6 months.   Dyspnea:  None/ no aerobics, walking neighborhoods ok  Cough: always dry/ any candy helps if keeps in mouth but esp hard candy / does not use cough drops  Sleep: rotates on side, bed is flat/ one pillow, never wakes up cough but /win 15 min of stirring recurs daily  SABA use: never, also never prednisone  Always trouble swallowing pills x lifetime  Call Dr Jenean office and ask what inhaler you received in 2019 as sample that seemed to help your cough = advair dpi Stop cetrizine, flonase   Prednisone  10 mg take  4 each am x 2 days,   2 each am x 2 days,  1 each am x 2 days and stop  Pantoprazole  (protonix ) 40 mg   Take  30-60 min before first meal of the day and Pepcid  (famotidine )  20 mg one after supper  until return to office  GERD   Gabapentin  100 mg four times daily (ok to stop when no coughing at all for a week)  Please schedule a follow up office visit in 4 weeks, sooner if needed  with all medications /inhalers/ solutions in hand so we can verify exactly what you are taking. This includes all  medications from all doctors and over the counters    09/06/2019  f/u ov/[name] re: uacs on gabapenitn 100 mg three times  wolicki had tried gabapentin  but failed to reveal that at last ov when I recommended it  Chief Complaint  Patient presents with   Follow-up    4 wk f/u. Wants to discuss switching Advair.   Dyspnea:  No  Cough: better just while on prednisone  maybe 50-80%   Sleeping: no noct cough SABA use: none 02: none  rec Gabapentin  200 mg up to 4 x daily  Pantoprazole  (protonix ) 40 mg   Take  30-60 min before first meal of the day and Pepcid  (famotidine )  20 mg one @  bedtime until return to office - this is the best way to tell whether stomach acid is contributing to your problem.   Keep the candy handy  Prednisone  10 mg  Take 4 for three days 3 for three days 2 for three days 1 for three days and stop  If not better in 2 weeks see Dr Kristie for your reflux  Please remember to go to the lab department and xray dept  for your tests - we will  call you with the results when they are available.         OV 10/05/2019  Subjective:  [name], male , DOB: August 11, 1940 , age 65 y.o. , MRN: [id number] , ADDRESS: [address] [place] [address]   10/05/2019 -   Chief Complaint  Patient presents with   Follow-up    Pt states he has been doing okay since last visit and denies any complaints.   -83 year old [place] male.  Referred for interstitial lung disease.  He is accompanied by his son Mackey who is a Teacher, early years/pre.  I know Jeff Willis  from a few years ago when heused to accompany his aunt; asthma patient of mine. Jeff Willis  has has been referred by Dr. Ozell America to the ILD center.  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  Up until the pandemic used to travel quite frequently.  He is to go to [place] for 3 months.  At none of the properties he is being exposed to mold.  He developed insidious onset of chronic cough over a year ago  this has been persistent.  In the last few months several cough related treatments have been tried with some improvement.  Has been on gabapentin  but this made him sleepy and is currently stopped it.  Because of chronic cough which he only attributes his constant clearing of the throat he had a high-resolution CT scan of the chest that I personally visualized and shows probable UIP pattern and therefore he has been referred here.  In my personal visualization it might even be definite of UIP because there might be some early honeycombing there.  He denies any shortness of breath.  At rest he was found to be tachycardic but he says this can at baseline for him.  He will follow this up with his primary care physician.  [facility]( ILD Questionnaire  Symptoms:   He denies any shortness of breath.  No episodic shortness of breath.  However on the questioning he does report mild shortness of breath climbing up stairs.  In terms of his cough it started in June 2020.  It is the same since it started maybe in the last week it is slightly better.  Is moderate in intensity.  He does clear the throat.  Mostly dry cough occasionally white sputum.  Does feel a tickle in the back of his throat.  He is not waking up in the middle of the night.  Occasionally affects his voice.  No nausea no vomiting no diarrhea no wheezing.   Past Medical History : Denies any asthma COPD or heart failure rheumatoid arthritis or scleroderma or lupus or polymyositis or Sjogren's.  Denies sleep apnea.  Denies HIV denies pulmonary hypertension.  Denies diabetes or thyroid  disease or stroke or seizures mononucleosis.  Denies any hepatitis or tuberculosis or kidney disease or pneumonia or blood clots or heart disease or pleurisy.  Does have on and off acid reflux for the last year   ROS: Positive for dry mouth for the last several months.  He also has some dysphagia for swallowing pills unclear for what duration.  He  also has acid reflux on PPI and H2 blocker.  Denies any Raynaud's or recurrent fever or weight loss.  No nausea no vomiting.   FAMILY HISTORY of LUNG DISEASE: * -Denies family history of COPD or asthma sarcoid or cystic fibrosis of hypersensitive pneumonitis or autoimmune disease.   EXPOSURE HISTORY: Denies  any Covid.  Denies smoking cigarettes.  Denies marijuana denies smoking.  Denies IV drug use denies cocaine   HOME and HOBBY DETAILS : Single-family home [place] the [place] for the last 10 years.  Age of the home is 15 years.  No dampness.  No mildew.  No humidifier use.  No CPAP use no nebulizer machine use.  He does use a steam iron but there is no mold or mildew in it.  There is no [place] inside the house no pet birds or parakeets.  No pet gerbils no feather pillows no mold in the [facility] duct no music habits.  He does do some gardening very small garden in the house limited gardening mostly just water  the garden.  No bird feather exposure no flood damage no strong mats no hot tub or Jacuzzi.  No exposure to animals at work.   OCCUPATIONAL HISTORY (122 questions) :   -Organic antigen exposure is negative inorganic exposure is negative   PULMONARY TOXICITY HISTORY (27 items):  -18-day prednisone  course that ended last week.  He did have seen implantation for prostate cancer in October 2019 these were radiation seeds.      Results for Roessner, Brenton Willis (MRN [id number]) as of 11/15/2019 16:27  Ref. Range 09/07/2019 11:29 10/05/2019 11:25  Anti Nuclear Antibody (ANA) Latest Ref Range: NEGATIVE   POSITIVE (A)  ANA Pattern 1 Unknown  Nuclear, Speckled (A)  ANA Titer 1 Latest Units: titer  1:40 (H)  Angiotensin-Converting Enzyme Latest Ref Range: 9 - 67 U/L  34  Cyclic Citrullin Peptide Ab Latest Units: UNITS  <16  ds DNA Ab Latest Units: IU/mL  <1  Myeloperoxidase Abs Latest Units: AI  <1.0  Serine Protease 3 Latest Units: AI  <1.0  RA Latex Turbid. Latest Ref Range: <14 IU/mL  <14   IgE (Immunoglobulin E), Serum Latest Ref Range: <OR=114 kU/L 36   SSA (Ro) (ENA) Antibody, IgG Latest Ref Range: <1.0 NEG AI  <1.0 NEG  SSB (La) (ENA) Antibody, IgG Latest Ref Range: <1.0 NEG AI  <1.0 NEG  Scleroderma (Scl-70) (ENA) Antibody, IgG Latest Ref Range: <1.0 NEG AI  <1.0 NEG     Simple office walk 185 feet x  3 laps goal with forehead probe 10/05/2019   O2 used ra  Number laps completed 3  Comments about pace avt  Resting Pulse Ox/HR 100% and 122/min  Final Pulse Ox/HR 100% and 143/min  Desaturated </= 88% x  Desaturated <= 3% points x  Got Tachycardic >/= 90/min x  Symptoms at end of test x  Miscellaneous comments x    Results for Willmott, Myka Willis (MRN [id number]) as of 10/05/2019 10:40  Ref. Range 09/07/2019 11:29  Eosinophils Absolute Latest Ref Range: 0.0 - 0.7 K/uL 0.1     High-resolution CT chest Sep 15, 2019: Personally visualized and agree with the findings below IMPRESSION: 1. Spectrum of findings compatible with basilar predominant fibrotic interstitial lung disease with suggestion of early honeycombing. 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 Am JINNY Honey Crit Care Med Vol 198, Iss 5, [phone], Dec 28 2016. 2. Ectatic 4.4 cm ascending thoracic aorta. Recommend annual imaging followup by CTA or MRA. This recommendation follows 2010 ACCF/AHA/AATS/ACR/ASA/SCA/SCAI/SIR/STS/SVM Guidelines for the Diagnosis and Management of Patients with Thoracic Aortic Disease. Circulation. 2010; 121: Z733-z630. Aortic aneurysm NOS (ICD10-I71.9). 3. One vessel coronary atherosclerosis. 4. Aortic Atherosclerosis (ICD10-I70.0).     Electronically Signed  By: Selinda DELENA Blue M.D.   On: 09/15/2019 14:26     Results for UGOCHUKWU, CHICHESTER (MRN [id number]) as of 10/05/2019 10:40  Ref. Range 09/07/2019 11:29  Sheep Sorrel IgE Latest Units: kU/L  <0.10  Pecan/Hickory Tree IgE Latest Units: kU/L <0.10  IgE (Immunoglobulin E), Serum Latest Ref Range: <OR=114 kU/L 36  Allergen, D pternoyssinus,d7 Latest Units: kU/L <0.10  Cat Dander Latest Units: kU/L <0.10  Dog Dander Latest Units: kU/L <0.10  [place] Grass Latest Units: kU/L <0.10  Johnson Grass Latest Units: kU/L <0.10  Timothy Grass Latest Units: kU/L <0.10  Cockroach Latest Units: kU/L <0.10  Aspergillus fumigatus, m3 Latest Units: kU/L <0.10  Allergen, Comm Silver Birch, t9 Latest Units: kU/L <0.10  Allergen, Cottonwood, t14 Latest Units: kU/L <0.10  Elm IgE Latest Units: kU/L <0.10  Allergen, Mulberry, t76 Latest Units: kU/L <0.10  Allergen, Oak,t7 Latest Units: kU/L <0.10  COMMON RAGWEED (SHORT) (W1) IGE Latest Units: kU/L 0.15 (H)  Allergen, Mouse Urine Protein, e78 Latest Units: kU/L <0.10  D. farinae Latest Units: kU/L <0.10  Allergen, Cedar tree, t12 Latest Units: kU/L <0.10  Box Elder IgE Latest Units: kU/L <0.10  Rough Pigweed  IgE Latest Units: kU/L <0.10    ROS - per HPI   OV 11/15/2019  Subjective:  Patient ID: Jeff MATSU LAURO, male , DOB: 27-Feb-1941 , age 52 y.o. , MRN: [id number] , ADDRESS: [address] [place] [place] [address]   11/15/2019 -   Chief Complaint  Patient presents with   Follow-up    Pt states the cough has been better since last visit.      HPI Matt Willis Forni 83 y.o. -presents with his son Mackey for follow-up.  He is here to discuss test results.  In the interim he called for worsening cough.  We gave him prednisone .  He tells me the prednisone  actually made the cough worse particularly towards the end.  After that the son  doubled up his PPI and also added H2 blockade and the cough went away completely.  Patient feels completely normal that he is possible that he has interstitial lung disease.  We reviewed the scan and I visualized it with him together and his son.  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  They wanted to discuss the implications  of the scan in the setting of controlled acid reflux and controlled cough but he is largely asymptomatic at this point.  Noted his serologies were negative except for trace ANA and his overnight oxygen study was normal.     OV 01/17/2020   Subjective:  Patient ID: Jeff MATSU LAURO, male , DOB: 1941-04-26, age 81 y.o. years. , MRN: [id number],  ADDRESS: [address] [place] [address] PCP  Hugh Charleston, MD Providers : Treatment Team:  Attending Provider: Geronimo Amel, MD   Chief Complaint  Patient presents with   Follow-up    pt is here to go over ct and pft    Follow-up interstitial lung disease Follow-up associated cough partially controlled with acid reflux treatment Associated weight loss present   HPI Kaizen Willis Imber 83 y.o. -presents with his son Mackey.  In the interim he is continue to lose weight.  He has lost another 3-4 pounds since June 2021.  His clothes are much looser.  He thinks he has been steadily losing weight.  His respiratory symptoms are not any worse although he says that his cough that seem to initially improve with doubling up of acid reflux therapy seems to persist.  At last visit in September 2020 when I thought the cough resolved but they tell me the cough only partially improved.  Nevertheless it is persisting and they are worried about it.  In addition he is worried about weight loss.  He also has poor appetite.  He is not in any pulmonary fibrosis antifibrotic's.  There is no diarrhea.  Symptom score suggest some element of anxiety since his last visit.  He has had pulmonary function test and it shows his FVC is actually better but his DLCO is actually worse.  He had high-resolution CT chest that I visualized and reviewed with him.  It shows probable UIP with possible emerging honeycombing.  I agree with the radiologist.  The radiologist actually feels that it might be a little bit worse compared to few months ago.  Of note: He has  upcoming trip to [place] between March 05, 2020 and February 2022.  He wants to make sure his care is coordinated.  He is identified a pulmonologist in his home city [place]     CT chest hig resoution 9/16?21  CLINICAL DATA:  83 year old male with history of shortness of breath. Evaluate for pulmonary fibrosis.   EXAM: CT CHEST WITHOUT CONTRAST   TECHNIQUE: Multidetector CT imaging of the chest was performed following the standard protocol without intravenous contrast. High resolution imaging of the lungs, as well as inspiratory and expiratory imaging, was performed.   COMPARISON:  Chest CT 09/15/2019.   FINDINGS: Cardiovascular: Heart size is normal. There is no significant pericardial fluid, thickening or pericardial calcification. There is aortic atherosclerosis, as well as atherosclerosis of the great vessels of the mediastinum and the coronary arteries, including calcified atherosclerotic plaque in the left main, left anterior descending and right coronary arteries. Dilatation of the ascending thoracic aorta measuring 4.5 cm in diameter.   Mediastinum/Nodes: No pathologically enlarged mediastinal or hilar lymph nodes. Please note that accurate exclusion of hilar adenopathy is limited on noncontrast CT scans. Esophagus is unremarkable in appearance. No axillary lymphadenopathy.   Lungs/Pleura: High-resolution images demonstrate widespread areas of ground-glass attenuation, septal thickening, thickening of the peribronchovascular interstitium, mild cylindrical bronchiectasis and peripheral bronchiolectasis. There is a suggestion of, but not definitive evidence of, early honeycombing in portions of the lungs. These findings do have a definitive craniocaudal gradient and appear slightly progressive compared to the prior study. Inspiratory and expiratory imaging is unremarkable. No acute consolidative airspace disease. No pleural effusions. No definite suspicious  appearing pulmonary nodules or masses are noted.   Upper Abdomen: Cortical calcification in the upper pole of the right kidney incidentally noted.   Musculoskeletal: There are no aggressive appearing lytic or blastic lesions noted in the visualized portions of the skeleton.   IMPRESSION: 1. The appearance of the lungs is compatible with progressive interstitial lung disease categorized as probable usual interstitial pneumonia (UIP) per current ATS guidelines. 2. Aortic atherosclerosis, in addition to left main and 2 vessel coronary artery disease. In addition, there is mild aneurysmal dilatation of the ascending thoracic aorta (4.5 cm in diameter). Ascending thoracic aortic aneurysm. Recommend semi-annual imaging followup by CTA or MRA and referral to cardiothoracic surgery if not already obtained. This recommendation follows 2010 ACCF/AHA/AATS/ACR/ASA/SCA/SCAI/SIR/STS/SVM Guidelines for the Diagnosis and Management of Patients With Thoracic Aortic Disease. Circulation. 2010; 121: [id number]. Aortic aneurysm NOS (ICD10-I71.9).   Aortic Atherosclerosis (ICD10-I70.0). Aortic aneurysm NOS (ICD10-I71.9).     Electronically Signed   By: Toribio Aye M.D.   On: 01/13/2020 15:15  ROS - per HPI    OV 03/01/2020  Subjective:  [name], male , DOB: Oct 16, 1940 , age 18 y.o. , MRN: [id number] , ADDRESS: [address] [place] [address] PCP Hugh Charleston, MD Patient Care Team: Hugh Charleston, MD as PCP - General (Family Medicine)  This Provider for this visit: Treatment Team:  Attending Provider: Geronimo Amel, MD  FU IPF Associated weight loss + pre-ofev    03/01/2020 -   Chief Complaint  Patient presents with   Follow-up    ILD, doing ok, cough has not improved     HPI Guadalupe Willis Sieloff 83 y.o. -returns for follow-up.  This is after starting nintedanib .  He is currently on 100 mg twice daily for 2 weeks.  His prescription is  approved on February 02, 2020.  He accidentally got 150 mg twice daily but he sent this back.  He is going to send us  back.  He has upcoming travel to [place] later this week he will be gone there for till February 2022.  He has supply and refill of his nintedanib  on 06 March 2020.  After that he does not have nintedanib .  He and his son report that somebody will be able to go to [place] and deliver it to him.  They do not want to trust the courier service to take it there the supply chain issues.  At this point time from a respiratory standpoint he has minimal dyspnea.  He still has a cough.  The cough is not worse but after reducing the PPI dose the cough has come back.  It is somewhat troublesome.  The severity score is listed below.  They report the GI Dr. Kristie has reported concern about taking very high-dose of PPI and H2 blockade which actually helped the cough.  Is because of osteoporosis risk.  I agreed with that concern.  However patient is also reporting significant symptom burden.  Primary care has suggested that they follow the symptom control and quality of life issues.  I did support the primary care viewpoint as well.  Did explain to them at the end of the day as a risk-benefit ratio and with an advanced disease like pulmonary fibrosis 1 might have to consider taking a short-term again with cough control at the expense of long-term risk of side effects.  They are going to think about this.  In terms of his travel to [place].  He put me in touch with Dr. Tobie.  I texted him and establish contact.  He will follow with Dr. Tobie who is a pulmonologist trained in [place] but now living in [place].  Gave other travel advice are listed below.  In terms of weight loss: This is ongoing.  Even after starting the Ofev  is not having any GI side effects but his weight loss continues.  He had a CT abdomen that is not showing any reason for weight loss.  Apparently had some hematuria had cystoscopy with Dr. Nieves.  He  has some radiation seed implants.  There is no bladder cancer.  The really frustrated by this.  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   Prior cough in  2019 for several months  resolved on on some kind of powder inhaler x one week = Advair     History of Present Illness  08/04/2019  Pulmonary/ 1st office eval/[name]  Chief Complaint  Patient presents with   Pulmonary Consult    Referred by Dr Hugh. Pt c/o non prod cough and throat clearing x 6 months.   Dyspnea:  None/ no aerobics, walking neighborhoods ok  Cough: always dry/ any candy helps if keeps in mouth but esp hard candy / does not use cough drops  Sleep: rotates on side, bed is flat/ one pillow, never wakes up cough but /win 15 min of stirring recurs daily  SABA use: never, also never prednisone  Call Dr Jenean office and ask what inhaler you received in 2019 as sample that seemed to help your cough = advair dpi Stop cetrizine, flonase   Prednisone  10 mg take  4 each am x 2 days,   2 each am x 2 days,  1 each am x 2 days and stop  Pantoprazole  (protonix ) 40 mg   Take  30-60 min before first meal of the day and Pepcid  (famotidine )  20 mg one after supper  until return to office  GERD   Gabapentin  100 mg  four times daily (ok to stop when no coughing at all for a week)  Please schedule a follow up office visit in 4 weeks, sooner if needed  with all medications /inhalers/ solutions in hand so we can verify exactly what you are taking. This includes all medications from all doctors and over the counters    09/06/2019  f/u ov/[name] re: uacs on gabapenitn 100 mg three times  wolicki had tried gabapentin  but failed to reveal that at last ov when I recommended it  Chief Complaint  Patient presents with   Follow-up    4 wk f/u. Wants to discuss switching Advair.   Dyspnea:  No  Cough: better just while on prednisone  maybe 50-80%   Sleeping: no noct cough SABA use: none 02: none  rec Gabapentin  200 mg up to 4 x daily  Pantoprazole  (protonix ) 40 mg   Take  30-60 min before first meal of the day and Pepcid  (famotidine )  20 mg one @  bedtime until return to office - this is the best way to tell whether stomach acid is contributing to your problem.   Keep the candy handy  Prednisone  10 mg  Take 4 for three days 3 for three days 2 for three days 1 for three days and stop  If not better in 2 weeks see Dr Kristie for your reflux  Please remember to go to the lab department and xray dept  for your tests - we will call you with the results when they are available.         OV 10/05/2019  Subjective:  [name], male , DOB: [birth year] ,  age 27 y.o. , MRN: [id number] , ADDRESS: [address] [place] [address]   10/05/2019 -   Chief Complaint  Patient presents with   Follow-up    Pt states he has been doing okay since last visit and denies any complaints.   -83 year old [place] male.  Referred for interstitial lung disease.  He is accompanied by his son Mackey who is a Teacher, early years/pre.  I know Jeff Willis  from a few years ago when heused to accompany his aunt; asthma patient of mine. Diyan MARCO RAPER  has has been referred by Dr. Ozell America to the ILD center.  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  Up until the pandemic used to travel quite frequently.  He is to go to [place] for 3 months.  At none of the properties he is being exposed to mold.  He developed insidious onset of chronic cough over a year ago this has been persistent.  In the last few months several cough related treatments have been tried with some improvement.  Has been on gabapentin  but this made him sleepy and is currently stopped it.  Because of chronic cough which he only attributes his constant clearing of the throat he had a high-resolution CT scan of the chest that I personally visualized and shows probable UIP pattern and therefore he has been referred here.  In my personal visualization it might even be definite of UIP because there might be some early honeycombing there.  He denies any shortness of breath.  At rest he was found to be tachycardic but he says this can at baseline for him.  He will follow this up with his primary care physician.  [facility]( ILD Questionnaire  Symptoms:   He denies any shortness of breath.  No episodic shortness of breath.  However on the questioning he does report mild shortness of breath climbing up stairs.  In terms of his cough it started in June 2020.  It is the same since it started maybe in the last week it is slightly better.  Is moderate in intensity.  He does clear the throat.  Mostly dry cough occasionally white sputum.  Does feel a tickle in the back of his throat.  He is not waking up in the middle of the night.  Occasionally affects his voice.  No nausea no vomiting no diarrhea no wheezing.     Past Medical History : Denies any asthma COPD or heart failure rheumatoid arthritis or scleroderma or lupus or polymyositis or Sjogren's.  Denies sleep apnea.  Denies HIV denies pulmonary hypertension.  Denies diabetes or thyroid  disease or stroke or seizures mononucleosis.  Denies any hepatitis or tuberculosis or kidney  disease or pneumonia or blood clots or heart disease or pleurisy.  Does have on and off acid reflux for the last year   ROS: Positive for dry mouth for the last several months.  He also has some dysphagia for swallowing pills unclear for what duration.  He also has acid reflux on PPI and H2 blocker.  Denies any Raynaud's or recurrent fever or weight loss.  No nausea no vomiting.   FAMILY HISTORY of LUNG DISEASE: * -Denies family history of COPD or asthma sarcoid or cystic fibrosis of hypersensitive pneumonitis or autoimmune disease.   EXPOSURE HISTORY: Denies any Covid.  Denies smoking cigarettes.  Denies marijuana denies smoking.  Denies IV drug use denies cocaine   HOME and HOBBY DETAILS : [place]-family home [place]  [place] for the last 10 years.  Age of the home is 15 years.  No dampness.  No mildew.  No humidifier use.  No CPAP use no nebulizer machine use.  He does use a steam iron but there is no mold or mildew in it.  There is no [place] inside the house no pet birds or parakeets.  No pet gerbils no feather pillows no mold in the [facility] duct no music habits.  He does do some gardening very small garden in the house limited gardening mostly just water  the garden.  No bird feather exposure no flood damage no strong mats no hot tub or Jacuzzi.  No exposure to animals at work.   OCCUPATIONAL HISTORY (122 questions) :   -Organic antigen exposure is negative inorganic exposure is negative   PULMONARY TOXICITY HISTORY (27 items):  -18-day prednisone  course that ended last week.  He did have seen implantation for prostate cancer in October 2019 these were radiation seeds.      Results for Molitor, Mylin Willis (MRN [id number]) as of 11/15/2019 16:27  Ref. Range 09/07/2019 11:29 10/05/2019 11:25  Anti Nuclear Antibody (ANA) Latest Ref Range: NEGATIVE   POSITIVE (A)  ANA Pattern 1 Unknown  Nuclear, Speckled (A)  ANA Titer 1 Latest Units: titer  1:40 (H)  Angiotensin-Converting Enzyme  Latest Ref Range: 9 - 67 U/L  34  Cyclic Citrullin Peptide Ab Latest Units: UNITS  <16  ds DNA Ab Latest Units: IU/mL  <1  Myeloperoxidase Abs Latest Units: AI  <1.0  Serine Protease 3 Latest Units: AI  <1.0  RA Latex Turbid. Latest Ref Range: <14 IU/mL  <14  IgE (Immunoglobulin E), Serum Latest Ref Range: <OR=114 kU/L 36   SSA (Ro) (ENA) Antibody, IgG Latest Ref Range: <1.0 NEG AI  <1.0 NEG  SSB (La) (ENA) Antibody, IgG Latest Ref Range: <1.0 NEG AI  <1.0 NEG  Scleroderma (Scl-70) (ENA) Antibody, IgG Latest Ref Range: <1.0 NEG AI  <1.0 NEG     Simple office walk 185 feet x  3 laps goal with forehead probe 10/05/2019   O2 used ra  Number laps completed 3  Comments about pace avt  Resting Pulse Ox/HR 100% and 122/min  Final Pulse Ox/HR 100% and 143/min  Desaturated </= 88% x  Desaturated <= 3% points x  Got Tachycardic >/= 90/min x  Symptoms at end of test x  Miscellaneous comments x    Results for Huyser, Catrell Willis (MRN [id number]) as of 10/05/2019 10:40  Ref. Range 09/07/2019 11:29  Eosinophils Absolute Latest Ref Range: 0.0 - 0.7 K/uL 0.1     High-resolution CT chest Sep 15, 2019: Personally visualized and agree with the findings below IMPRESSION: 1. Spectrum of findings compatible with basilar predominant fibrotic interstitial lung disease with suggestion of early honeycombing. 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 Am JINNY Honey Crit Care Med Vol 198, Iss 5, [id number], Dec 28 2016. 2. Ectatic 4.4 cm ascending thoracic aorta. Recommend annual imaging followup by CTA or MRA. This recommendation follows 2010 ACCF/AHA/AATS/ACR/ASA/SCA/SCAI/SIR/STS/SVM Guidelines for the Diagnosis and Management of Patients with Thoracic Aortic Disease. Circulation. 2010; 121: Z733-z630. Aortic aneurysm NOS (ICD10-I71.9). 3. One vessel coronary  atherosclerosis. 4. Aortic Atherosclerosis (ICD10-I70.0).     Electronically Signed   By: Selinda DELENA Blue M.D.   On: 09/15/2019 14:26     Results for KANNAN, PROIA (MRN [id number]) as of 10/05/2019 10:40  Ref.  Range 09/07/2019 11:29  Sheep Sorrel IgE Latest Units: kU/L <0.10  Pecan/Hickory Tree IgE Latest Units: kU/L <0.10  IgE (Immunoglobulin E), Serum Latest Ref Range: <OR=114 kU/L 36  Allergen, D pternoyssinus,d7 Latest Units: kU/L <0.10  Cat Dander Latest Units: kU/L <0.10  Dog Dander Latest Units: kU/L <0.10  [place] Grass Latest Units: kU/L <0.10  Johnson Grass Latest Units: kU/L <0.10  Timothy Grass Latest Units: kU/L <0.10  Cockroach Latest Units: kU/L <0.10  Aspergillus fumigatus, m3 Latest Units: kU/L <0.10  Allergen, Comm Silver Birch, t9 Latest Units: kU/L <0.10  Allergen, Cottonwood, t14 Latest Units: kU/L <0.10  Elm IgE Latest Units: kU/L <0.10  Allergen, Mulberry, t76 Latest Units: kU/L <0.10  Allergen, Oak,t7 Latest Units: kU/L <0.10  COMMON RAGWEED (SHORT) (W1) IGE Latest Units: kU/L 0.15 (H)  Allergen, Mouse Urine Protein, e78 Latest Units: kU/L <0.10  D. farinae Latest Units: kU/L <0.10  Allergen, Cedar tree, t12 Latest Units: kU/L <0.10  Box Elder IgE Latest Units: kU/L <0.10  Rough Pigweed  IgE Latest Units: kU/L <0.10    ROS - per HPI   OV 11/15/2019  Subjective:  [name], male , DOB: [birth year] , age 25 y.o. , MRN: [id number] , ADDRESS: [address] [place] [address]   11/15/2019 -   Chief Complaint  Patient presents with   Follow-up    Pt states the cough has been better since last visit.      HPI Bartt Willis Crissman 83 y.o. -presents with his son Mackey for follow-up.  He is here to discuss test results.  In the interim he called for worsening cough.  We gave him prednisone .  He tells me the prednisone  actually made the cough worse particularly towards the end.  After that the son  doubled up his PPI and  also added H2 blockade and the cough went away completely.  Patient feels completely normal that he is possible that he has interstitial lung disease.  We reviewed the scan and I visualized it with him together and his son.  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  They wanted to discuss the implications of the scan in the setting of controlled acid reflux and controlled cough but he is largely asymptomatic at this point.  Noted his serologies were negative except for trace ANA and his overnight oxygen study was normal.     OV 01/17/2020   Subjective:  [name], male , DOB: 03/09/1941, age 35 y.o. years. , MRN: [id number],  ADDRESS: [address] [place] [address] PCP  Hugh Charleston, MD Providers : Treatment Team:  Attending Provider: Geronimo Amel, MD   Chief Complaint  Patient presents with   Follow-up    pt is here to go over ct and pft    Follow-up interstitial lung disease Follow-up associated cough partially controlled with acid reflux treatment Associated weight loss present   HPI Jeff Willis 83 y.o. -presents with his son Mackey.  In the interim he is continue to lose weight.  He has lost another 3-4 pounds since June 2021.  His clothes are much looser.  He thinks he has been steadily losing weight.  His respiratory symptoms are not any worse although he says that his cough that seem to initially improve with doubling up of acid reflux therapy seems to persist.  At last visit in September 2020 when I thought the cough resolved but they tell me the cough only partially improved.  Nevertheless it is persisting and  they are worried about it.  In addition he is worried about weight loss.  He also has poor appetite.  He is not in any pulmonary fibrosis antifibrotic's.  There is no diarrhea.  Symptom score suggest some element of anxiety since his last visit.  He has had pulmonary function test and it shows his FVC is actually better but his DLCO is actually  worse.  He had high-resolution CT chest that I visualized and reviewed with him.  It shows probable UIP with possible emerging honeycombing.  I agree with the radiologist.  The radiologist actually feels that it might be a little bit worse compared to few months ago.  Of note: He has upcoming trip to [place] between March 05, 2020 and February 2022.  He wants to make sure his care is coordinated.  He is identified a pulmonologist in [place] city [place]     CT chest hig resoution 9/16?21  CLINICAL DATA:  83 year old male with history of shortness of breath. Evaluate for pulmonary fibrosis.   EXAM: CT CHEST WITHOUT CONTRAST   TECHNIQUE: Multidetector CT imaging of the chest was performed following the standard protocol without intravenous contrast. High resolution imaging of the lungs, as well as inspiratory and expiratory imaging, was performed.   COMPARISON:  Chest CT 09/15/2019.   FINDINGS: Cardiovascular: Heart size is normal. There is no significant pericardial fluid, thickening or pericardial calcification. There is aortic atherosclerosis, as well as atherosclerosis of the great vessels of the mediastinum and the coronary arteries, including calcified atherosclerotic plaque in the left main, left anterior descending and right coronary arteries. Dilatation of the ascending thoracic aorta measuring 4.5 cm in diameter.   Mediastinum/Nodes: No pathologically enlarged mediastinal or hilar lymph nodes. Please note that accurate exclusion of hilar adenopathy is limited on noncontrast CT scans. Esophagus is unremarkable in appearance. No axillary lymphadenopathy.   Lungs/Pleura: High-resolution images demonstrate widespread areas of ground-glass attenuation, septal thickening, thickening of the peribronchovascular interstitium, mild cylindrical bronchiectasis and peripheral bronchiolectasis. There is a suggestion of, but not definitive evidence of, early honeycombing  in portions of the lungs. These findings do have a definitive craniocaudal gradient and appear slightly progressive compared to the prior study. Inspiratory and expiratory imaging is unremarkable. No acute consolidative airspace disease. No pleural effusions. No definite suspicious appearing pulmonary nodules or masses are noted.   Upper Abdomen: Cortical calcification in the upper pole of the right kidney incidentally noted.   Musculoskeletal: There are no aggressive appearing lytic or blastic lesions noted in the visualized portions of the skeleton.   IMPRESSION: 1. The appearance of the lungs is compatible with progressive interstitial lung disease categorized as probable usual interstitial pneumonia (UIP) per current ATS guidelines. 2. Aortic atherosclerosis, in addition to left main and 2 vessel coronary artery disease. In addition, there is mild aneurysmal dilatation of the ascending thoracic aorta (4.5 cm in diameter). Ascending thoracic aortic aneurysm. Recommend semi-annual imaging followup by CTA or MRA and referral to cardiothoracic surgery if not already obtained. This recommendation follows 2010 ACCF/AHA/AATS/ACR/ASA/SCA/SCAI/SIR/STS/SVM Guidelines for the Diagnosis and Management of Patients With Thoracic Aortic Disease. Circulation. 2010; 121: [id number]. Aortic aneurysm NOS (ICD10-I71.9).   Aortic Atherosclerosis (ICD10-I70.0). Aortic aneurysm NOS (ICD10-I71.9).     Electronically Signed   By: Toribio Aye M.D.   On: 01/13/2020 15:15    ROS - per HPI    OV 03/01/2020  Subjective:  [name], male , DOB: May 22, 1940 , age 36 y.o. , MRN:  [id number] , ADDRESS: [address] [place] [address] PCP Hugh Charleston, MD Patient Care Team: Hugh Charleston, MD as PCP - General (Family Medicine)  This Provider for this visit: Treatment Team:  Attending Provider: Geronimo Amel, MD  FU IPF Associated weight loss +  pre-ofev    03/01/2020 -   Chief Complaint  Patient presents with   Follow-up    ILD, doing ok, cough has not improved     HPI Xayvier Willis Jeff Willis 83 y.o. -returns for follow-up.  This is after starting nintedanib .  He is currently on 100 mg twice daily for 2 weeks.  His prescription is approved on February 02, 2020.  He accidentally got 150 mg twice daily but he sent this back.  He is going to send us  back.  He has upcoming travel to [place] later this week he will be gone there for till February 2022.  He has supply and refill of his nintedanib  on 06 March 2020.  After that he does not have nintedanib .  He and his son report that somebody will be able to go to [place] and deliver it to him.  They do not want to trust the courier service to take it there the supply chain issues.  At this point time from a respiratory standpoint he has minimal dyspnea.  He still has a cough.  The cough is not worse but after reducing the PPI dose the cough has come back.  It is somewhat troublesome.  The severity score is listed below.  They report the GI Dr. Kristie has reported concern about taking very high-dose of PPI and H2 blockade which actually helped the cough.  Is because of osteoporosis risk.  I agreed with that concern.  However patient is also reporting significant symptom burden.  Primary care has suggested that they follow the symptom control and quality of life issues.  I did support the primary care viewpoint as well.  Did explain to them at the end of the day as a risk-benefit ratio and with an advanced disease like pulmonary fibrosis 1 might have to consider taking a short-term again with cough control at the expense of long-term risk of side effects.  They are going to think about this.  In terms of his travel to [place].  He put me in touch with Dr. Tobie.  I texted him and establish contact.  He will follow with Dr. Tobie who is a pulmonologist trained in [place] but now living in [place].  Gave other travel  advice are listed below.  In terms of weight loss: This is ongoing.  Even after starting the Ofev  is not having any GI side effects but his weight loss continues.  He had a CT abdomen that is not showing any reason for weight loss.  Apparently had some hematuria had cystoscopy with Dr. Nieves.  He has some radiation seed implants.  There is no bladder cancer.  The really frustrated by this.  I did pull out an article about IPF and weight loss for the son to read.- shows IPF weight loss is asssocited with worse progrnosis    ROS - per HPI    OV 07/24/2020  Subjective:  [name], male , DOB: Jan 05, 1941 , age 63 y.o. , MRN: [id number] , ADDRESS: [address] [place] [address] PCP Hugh Charleston, MD Patient Care Team: Hugh Charleston, MD as PCP - General (Family Medicine)  This Provider for this visit: Treatment Team:  Attending Provider: Geronimo Amel,  MD    07/24/2020 -   Chief Complaint  Patient presents with   Follow-up    4 mo f/u. States his breathing has been stable since last visit. Has noticed a decrease in his appetite. Increased diarrhea for the past week.    Idiopathic pulmonary fibrosis on nintedanib  100 mg twice daily -last CT September 2021 Weight loss unintentional even before nintedanib   HPI Jeff Willis 83 y.o. -returns for follow-up.  He was in [place] and returned back in early March 2022.  He is now here with his wife.  He tells me while he was in [place] for 4 months he had a great time.  He barely had any diarrhea.  Approximately 2 months ago which is 1 month before his return he switched to an [place] version of nintedanib  called NINTIB.  He was taking it for a month in [place] and did not have any problems.  Upon returning to [place]  he is starting to have diarrhea.  The diarrhea is ongoing and is 3 out of 5.  Happens 2 or 3 times a week and controlled by Imodium.  His son who is a Teacher, early years/pre thinks it is because he  is in the [place] version of nintedanib .  He says he spoke to the [place] pulmonologist and was told that exponentially there is no difference in diarrhea between the [place]  version of nintedanib  and the [place] version of nintedanib .  Diarrhea happens randomly does bother him.  He says his diet has not changed since returning to [place] .  He says he eats the same [place] diet.  He does take sugar with tea in the morning.  In terms of his tachycardia it still is ongoing.  He is never had echocardiogram.  In terms of weight loss he continues to lose weight.  This weight loss started even before he was on nintedanib .  It is still ongoing.  He is lost 10 pounds since June 2021.  He is worried about this.  PFT  OV 09/22/2020  Subjective:  [name], male , DOB: [birth year] , age 11 y.o. , MRN: [id number] , ADDRESS: [address] [place] [address] PCP Hugh Charleston, MD Patient Care Team: Hugh Charleston, MD as PCP - General (Family Medicine)  This Provider for this visit: Treatment Team:  Attending Provider: Geronimo Amel, MD    09/22/2020 -   Chief Complaint  Patient presents with   Follow-up    2 mo f/u after PFT. States he is still struggling with diarrhea and losing weight. States his breathing has been stable since last visit.    Idiopathic pulmonary fibrosis on nintedanib  100 mg twice daily since Oct 2021   -last CT September 2021 Weight loss unintentional even before nintedanib  Cough Tachycardia NOS  HPI Yohance Willis Fitch 83 y.o. -returns for follow-up of all the above medical issues.  From a respiratory standpoint he continues to be stable.  He had pulmonary function test that shows continued stability/improvement.  His cough continues.  He in fact he tells me that for the last 2 or 3 years mostly when he drinks water  or eats food he starts choking and coughing and clears his throat.  He has now seen ENT and the dysphagia evaluation  is set up.  He is continue to lose weight.  In fact he is lost 10 pounds of weight since starting nintedanib .  The weight loss was the even before nintedanib  but has accelerated after nintedanib .  He has diarrhea with the nintedanib .  This is on low-dose nintedanib .  Currently is on the [place]  version of the nintedanib  but he still losing weight and having the same GI symptoms.  In terms of his tachycardia he had echocardiogram and this is normal.  He says his wife is very concerned about his weight loss.  His pants are loose now.      ECHO MAY 2022   IMPRESSIONS     1. Left ventricular ejection fraction, by estimation, is 60 to 65%. The  left ventricle has normal function. The left ventricle has no regional  wall motion abnormalities. Left ventricular diastolic parameters are  indeterminate.   2. Right ventricular systolic function is normal. The right ventricular  size is normal. There is mildly elevated pulmonary artery systolic  pressure. The estimated right ventricular systolic pressure is 34.8 mmHg.   3. The mitral valve is normal in structure. No evidence of mitral valve  regurgitation.   4. Tricuspid valve regurgitation is mild to moderate.   5. The aortic valve is tricuspid. Aortic valve regurgitation is not  visualized. No aortic stenosis is present.   6. The inferior vena cava is normal in size with greater than 50%  respiratory variability, suggesting right atrial pressure of 3 mmHg.     OV 01/11/2021  Subjective:  [name], male , DOB: Nov 19, 1940 , age 69 y.o. , MRN: [id number] , ADDRESS: [address] [place] [address] PCP Hugh Charleston, MD Patient Care Team: Hugh Charleston, MD as PCP - General (Family Medicine)  This Provider for this visit: Treatment Team:  Attending Provider: Geronimo Amel, MD Type of visit: Telephone/Video Circumstance: COVID-19 national emergency Identification of patient OMARIAN JAQUITH  with 27-Jan-1941 and MRN [id number] - 2 person identifier Risks: Risks, benefits, limitations of telephone visit explained. Patient understood and verbalized agreement to proceed Anyone else on call: no one other than patient and MD Patient location: patient home This provider location: [address]; [place], [place], [address]   01/11/2021 -  IPF followup   Idiopathic pulmonary fibrosis on nintedanib  100 mg twice daily since Oct 2021   -last CT September 2021 Weight loss unintentional even before nintedanib  Cough Tachycardia NOS  HPI Jeff Willis Case 83 y.o. - off ofev  since may 2022 Gained weight 5#/ Minimal dyspnea to no dyspnea even with exertion. [place] in interim and had good time. Did water  slide. Cough is a bit worse. Othewrise well. Enegery level goodl Though cough is mild - wants something for cough relief thought says medicine from [place] helping.  Plans to go to [place] on February 21, 2021    OV 02/06/2021  Subjective:  [name], male , DOB: 1940-06-22 , age 62 y.o. , MRN: [id number] , ADDRESS: [address] [place] [address] PCP Hugh Charleston, MD Patient Care Team: Hugh Charleston, MD as PCP - General (Family Medicine)  This Provider for this visit: Treatment Team:  Attending Provider: Geronimo Amel, MD    02/06/2021 -   Chief Complaint  Patient presents with   Follow-up    Esbriet  follow up, cough in the morning    Idiopathic pulmonary fibrosis on nintedanib  100 mg twice daily since Oct 2021   -last CT September 2022, pft oct 2022 -> progression on CT scan  -Stop nintedanib  September 2022 due to weight loss and intolerance  -Started pirfenidone  October 2022 Weight loss unintentional even before nintedanib   -Improved September/October 2022 after  stopping nintedanib  Cough and Dysphagia  -Normal endoscopy with Dr. Kristie September 2021/October 2021  -Abnormal barium study September 2022  HPI Antwan LIZANDRO SPELLMAN 83 y.o. -returns for follow-up.  This is a head of his trip to [place] February 21, 2021.  He will only be back after 6 months in March /April 2023.  He tells me overall he stable.  His symptom score is stable.  Very little to no dyspnea.  He continues to have dysphagia and his chronic cough.  A year ago his endoscopy was normal.  He saw ENT because of his cough they did a barium study.  As barium study was done end of September 2022 and the suggestion of cervical web on the barium study.  He asked me to call Dr. Kristie his GI.  I called her.  She said she will work him in.  He might need another endoscopy to rule out cervical web.  We discussed cervical web with the help of Google images.  Currently he is only bothered by cough especially early in the morning but this cough is stable as can be seen in the symptom score.  There is no dyspnea.  He came back from [place].  He plans to go to [place] February 21, 2021 and will stay there through March April 2023.  He is agreed for a telephone visit while in [place].  He has a pulmonologist in [place].  His walking desaturation test is stable except for mild tachycardia.  No shortness of breath.  He says he had liver function test through his primary care and this was normal.  He does not want to be checked today.    OV 09/07/2021  Subjective:  [name], male , DOB: 1940/07/03 , age 60 y.o. , MRN: [id number] , ADDRESS: [address] [place] [address] PCP Hugh Charleston, MD Patient Care Team: Hugh Charleston, MD as PCP - General (Family Medicine)  This Provider for this visit: Treatment Team:  Attending Provider: Geronimo Amel, MD    09/07/2021 -   Chief Complaint  Patient presents with   Follow-up    PFT performed today.      HPI Algenis Willis Nikolic 83 y.o. -returns for follow-up.  Last seen in October 2022.  Then in November 2023 went to [place] to [place].  He returned in March 2023.  While in [place] he says  overall he did not have any problems.  He was continuing his pirfenidone .  However he lost his weight.  He did then pause his pirfenidone  for 3 weeks and his weight improved.  Then after coming here he started losing weight again.  He could not figure out if it is because of dysphagia which had also started versus pirfenidone .  He then has reduced his pirfenidone  to 2 pills 3 times daily.  At this point in time is not interested in clinical trials.  He says he wants to avoid many medications.  Simultaneously has been dealing with dysphagia particular with solids and spices.  Also hard food.  He is clearing his throat quite frequently although he still rates it as level 1 out of 5.  While in [place] May 04, 2021 he had PET scan.  He showed me the images of the PET scan.  We can upload the CD-ROM in the PACS system.  In the entire left lower lobe there is slightly increased uptake for 2.7.  This is consistent with the fibrotic tissue there is also low uptake  in the mediastinal adenopathy which is considered to be reactive.  He did not want any quick follow-up of his CT scan.  He is okay to have a CT scan in September 2023.  He wants avoid radiation exposure.   On August 10, 2021 at [facility] long Dr. Iverson performed status post dilatation of esophageal stenosis.  I reviewed the records.  Of note: He plans to go to [place] in October 2023.  He wants his next follow-up in September 23.  In August 2023 is going with a lot of people to [place]        PFT      OV 02/25/2022  Subjective:  [name], male , DOB: 1941-01-31 , age 22 y.o. , MRN: [id number] , ADDRESS: [address] [place] [address] PCP Hugh Charleston, MD Patient Care Team: Hugh Charleston, MD as PCP - General (Family Medicine)  This Provider for this visit: Treatment Team:  Attending Provider: Geronimo Amel, MD    02/25/2022 -   Chief Complaint  Patient presents with   Follow-up    PFT performed  today.  Pt states he is slowly getting better after recent hospital stay.    HPI Jeff Willis 83 y.o. -returns for follow-up.  Over the summer 2023 got hospitalized for COVID-19 related respiratory failure and IPF flareup and ARDS.  He then had a complication by aspiration for which she did not get intubated according to the son.  He then spent time in inpatient rehab and is now home for the last 3 weeks.  At home he is continue to improve.  He is on a chronic prednisone  taper with Bactrim .  At this point in time he is not needing oxygen at rest based on home pulse oximetry monitoring.  Here we walked him 5 feet and back on room air and he did not desaturate below 94%.  Recent liver function test normal.  Anemia also improving.   He has had continued weight loss although he has gained weight since the hospitalization compared to prehospitalization this further weight loss to 114 pounds.  Some of this was pirfenidone  mediated.  He is now back on the pirfenidone  for the last few weeks.  They also feel that some of the weight loss is because he is not able to take enough food because of the dysphagia and the cough.  He continues to be physically deconditioned but even that he has improved.  He is able to do some ADLs.  He is able to take shower with the help of his wife.  He is able to walk within the house although still a little bit wobbly   He now has a chronic Foley which she is not able to get rid of after the hospitalization.  He follows with Dr. Nieves neurology.   The main issue right now is his cough and clearing of the throat.  The 2 are slightly unrelated.  He coughs when he eats food.  He also coughs when he clears her throat.  He throats clears his throat independent of the cough.  Eating food makes him clear the throat more. .  At night he does not clear the throat.  He has not seen ENT in 2 years.  There is no history of difficult intubation.  He and her son feel it is related to  his dysphagia issues.  Mid November 2023 he is being scheduled for esophageal dilatation by Dr. Belvie Just.  I did tell them  that the risk for endoscopy is acceptable given the fact he was able to walk 5-10 feet without any desaturation on room air ..  I supported him having the endoscopy.  They believe there might be some sinus drainage.  Did encourage him to try over-the-counter Flonase  and also Mucinex .   He did have some back pain in the hospital and was started on gabapentin .  Is clearly not helping his cough or clearing of the throat.  He is back pain is not much of an issue.  We took a shared decision making he can come off this.      04/01/2022 Patient presents today for follow-up.  She has a history of IPF recently diagnosed with community-acquired pneumonia.  Chest x-ray on 03/20/2022 showed increased left lung opacity.  He was treated with liquid Augmentin  x 7 days. Cough is worse the last several days. Son reports that he has several distince coughs. No associated shortness of breath. He has new swelling in feet last 4-5 days. Constantly clearing his throat, aggressively trys to cough up mucus but cough is mainly dry. When he lays flat he does not cough.  He is compliant with Esbriet  and taking prednisone  every other day. No increased oxygen demands. During last office visit with Dr. Geronimo he was referred to ENT due to cough to rule out postnasal drip as cause. He has not been contacted to schedule visit.  He had an EGD in November which showed esophageal stenosis.  Takes Protonix  twice daily, needs refill.       OV 06/20/2022  Subjective:  [name], male , DOB: 1940/10/08 , age 34 y.o. , MRN: [id number] , ADDRESS: [address] [place] [address] PCP Hugh Charleston, MD Patient Care Team: Hugh Charleston, MD as PCP - General (Family Medicine) Burnard Debby LABOR, MD as PCP - Cardiology (Cardiology)  This Provider for this visit: Treatment Team:   Attending Provider: Geronimo Amel, MD   06/20/2022 -visit after exposure to flulike illness.   HPI Jeff Willis 83 y.o. -presents with son benign.  This is an acute visit.  He has full schedule regular visit coming up.  Some 3-4 days ago son thought he had the flu.  Initially said he had the flu but later said he did not test himself for the flu but he was flulike illness.  The daughter-in-law who also lives in the house got similar symptoms then 2 days ago patient started with body ache and cough and wet cough that is increased compared to baseline.  The sputum was clear in color and there is no fever or chills.  There is no runny nose.  Currently he is improving actually.  His cough intensity has come down his body ache is come down.  He is to do pulmonary rehabilitation and is not desaturating but he still uses his oxygen for psychological comfort.  He did tell me that when he goes upstairs he goes down to 88-89% but this is baseline.  He does not think he is desaturating currently.  We did a rapid flu test and it was negative.  He is continue to lose weight.  He had endoscopy in November 2023 for esophageal stenosis.  Frequent scheduled endoscopy at [facility] is pending by Dr. Belvie Just.  He is continue to lose weight.      OV 07/02/2022  Subjective:  [name], male , DOB: Jun 24, 1940 , age 63 y.o. , MRN: [id number] ,  ADDRESS: [address] [place] [address] PCP Hugh Charleston, MD Patient Care Team: Hugh Charleston, MD as PCP - General (Family Medicine) Burnard Debby LABOR, MD as PCP - Cardiology (Cardiology)  This Provider for this visit: Treatment Team:  Attending Provider: Geronimo Amel, MD   07/02/2022 -   Chief Complaint  Patient presents with   Follow-up    PFT     HPI Jeff Willis 83 y.o. -returns for follow-up.  I recently saw him a few weeks ago because of exposure to flulike illness.  He states he is  back to baseline.  However does appear to have lost 5 more pounds of weight in the last 2 weeks.  His son is surprised by this.  Patient does admit to low appetite.  However he is known to deal with esophageal stenosis.  The son did indicate to me at last visit and again this visit that Dr. Belvie Just is considering sequential endoscopy with sequential dilatation at specific interval.  Apparently this has been hard to schedule with the endoscopy suite at [facility] long.  At the request I did reach to Clotilda Schmitz the director for the endoscopy suite to work with Dr. Belvie Just to make sure this was possible because of his ongoing weight loss.  Son is also not sure whether it is accurate weight loss of 5 pounds in the last 2 weeks.  Therefore I asked them to track dry weight on a scheduled basis at a specific fix time.  We did discuss about the possibility of pirfenidone  playing a role in the weight loss.  They are also having a swallowing evaluation coming up at [facility]   He did have lung function test today and overall there is a 35% decline in [facility] compared to May 2023 [his COVID was in July August 2023].  There is at least 14% progression since fall 2023.  They are surprised by the decline in PFT because they said that he is actually doing better his cough is better and his symptom scores are better.  He does have oxygen but he states that rest he does not need it.  Also nighttime he is not using it consistently and does not feel the need to.  They say when they checked the pulse ox when he is exerting or sitting still it is stable.  Overall son and he were in alignment that ever since COVID in summer 2023 his overall health status is declined. CT Chest data OV 09/12/2022  Subjective:  [name], male , DOB: December 15, 1940 , age 29 y.o. , MRN: [id number] , ADDRESS: [address] [place] [address] PCP Hugh Charleston, MD Patient Care Team: Hugh Charleston, MD as PCP -  General (Family Medicine) Burnard Debby LABOR, MD as PCP - Cardiology (Cardiology) Mealor, Eulas BRAVO, MD as PCP - Electrophysiology (Cardiology)  This Provider for this visit: Treatment Team:  Attending Provider: Geronimo Amel, MD    09/12/2022 -   Chief Complaint  Patient presents with   Follow-up    F/up wet cough, thin and white mucous.     HPI Jeff Willis 83 y.o. -returns for follow-up.  Presents with her son Mackey.  He is not using oxygen anymore.  In fact he did a sit/stand test and he did not need oxygen.  Although he is sitting here with oxygen.  He feels stable he is driving car.  However he continues to lose weight although the pace of the  weight loss might of stabilized.  He continues his pirfenidone .  His main issues appear to be swallowing related.  He did have esophageal dilatation early March 2024 by Dr. Belvie Just.  Subsequent to that has seen ENT has had extensive evaluation at [facility].  The son showed me a video of a barium swallow test.  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  He needs to drink water  to swallow the pill.  Referral has been made to [facility]..  Also in the last week he has had increased cough which appears to be wet but there is no fever or chills wheezing.  The son thinks is because he reduced his Protonix .  He wants to increase the Protonix .  If this does not work recommended a 5-day prednisone  taper.  Also he plans to go to [place] in October 2024.  I cautioned him somewhat against this because of his frailty but decided will repeat pulmonary function test in September 2023 and decide.     OV 01/23/2023  Subjective:  [name], male , DOB: [birth year] , age 64 y.o. , MRN: [id number] , ADDRESS: [address] [place] [address] PCP Hugh Charleston, MD Patient Care Team: Hugh Charleston, MD as PCP - General (Family Medicine) Burnard Debby LABOR, MD as PCP - Cardiology (Cardiology) Mealor, Eulas BRAVO, MD as PCP -  Electrophysiology (Cardiology)  This Provider for this visit: Treatment Team:  Attending Provider: Geronimo Amel, MD    01/23/2023 -   Chief Complaint  Patient presents with   Follow-up    HPI Jeff Willis 83 y.o. -returns for follow-up.  Presents with her son Mackey who is an independent historian.  Several issues  #IPF: He feels he is stable.  Excess hypoxemia test today is stable .  He feels the symptoms are stable but his FVC shows a decline.  #Physical deconditioning: He is using a cane.  It appears he had a fall 1 time is the first ever fall.  There is no visible injury.  He thinks it is a mechanical fall because he was not using his cane properly.  The family is monitoring it but overall he is stronger compared to his admission last year but not made any further improvement  #Cachexia and failure to thrive and weight loss: On my scale her weight is stable.  We have assumed this was because of Esbriet  but the son feels that some of weight loss.  They did indicate he has prostatic cancer  #Prostate cancer and hilar adenopathy: He had a PET scan end of August 2024 personally visualized that he has a right hilar adenopathy that is lighting up on PET scan along with pleural metastasis.  The son believes it is from his prostate cancer.  They are going to see a urologist now at [facility].  He is currently seeing Dr. Nieves.  I did indicate to him that if the urologist feels this requires a biopsy then we would be able to biopsy it here  #Travel advice: He desperately wants to go to [place] between November and February 2025.  His x-rays hypoxemia test is stable and although there are no contraindications I did tell him there was elevated risk.  Given the letter for oxygen support for air travel.  They are going for stat domestic air travel.   #Cough and dysphagia this is somewhat stable.  Reviewed the notes in April 2024 by GI at [facility].  He has irritable larynx.  They might try another GI doctor.   OV 03/06/2023  Subjective:  [name], male , DOB: 1941/01/25 , age 46 y.o. , MRN: [id number] , ADDRESS: [address] [place] [address] PCP Hugh Charleston, MD (Inactive) Patient Care Team: Hugh Charleston, MD (Inactive) as PCP - General (Family Medicine) Burnard Debby LABOR, MD as PCP - Cardiology (Cardiology) Mealor, Eulas BRAVO, MD as PCP - Electrophysiology (Cardiology)  This Provider for this visit: Treatment Team:  Attending Provider: Geronimo Amel, MD    03/06/2023 -   Chief Complaint  Patient presents with   Follow-up    Pt states he is doing well,      HPI Jeff Willis 83 y.o. -returns for follow-up with son Mackey.  Overall he is stable.    Travel advice: This visit is for he had to [place] end of November 2024.  He will be back in March 2025.  His symptom score in excess hypoxemia test is stable.  I did advise him to get liver function test in [place].  I also did fill out a letter for him to show the airline so he can take his portable oxygen  IPF: Clinically stable.  Excess hypoxemia test shows stability  Therapeutic monitoring and pirfenidone  intake: Tolerating it well.  Recent liver function test normal.  He will have liver function test in [place].  For this refill the pharmacy or the genetic team sent him capsule which she cannot swallow.  Therefore he wants a tablet.  Personally communicated this with the pharmacy team.  Weight loss: Stable   Malignant neoplasm: He has prostate cancer and there is believe that is spread to the lungs with pleural metastasis.  This was on a PET scan.  I was concerned that this could be another primary malignancy.  Therefore advised him to discuss this with his oncologist at [facility].  According to him and his son this was discussed and no biopsy has been recommended.  He did see Dr. Zachary Sieving on 01/29/2023 and confirmed the oncologist impression that  resolved prostate cancer with spread.   Dysphagia: He is now seeing another GI doctor.  He is seeing Dr. Deana Blanch of [facility] [place].  Notes reviewed 02/27/2023.  He      OV 09/18/2023  Subjective:  [name], male , DOB: [birth year] , age 46 y.o. , MRN: [id number] , ADDRESS: [address] [place] [address] PCP Hugh Charleston, MD (Inactive) Patient Care Team: Hugh Charleston, MD (Inactive) as PCP - General (Family Medicine) Burnard Debby LABOR, MD as PCP - Cardiology (Cardiology) Mealor, Eulas BRAVO, MD as PCP - Electrophysiology (Cardiology)  This Provider for this visit: Treatment Team:  Attending Provider: Geronimo Amel, MD   09/18/2023 -  FU IPF      HPI Jeff Willis 83 y.o. -returns for follow-up.  This for IPF.  He is on pirfenidone .  He is on low-dose protocol.  He has been doing well.  He feels he has gained weight in [place].  His mood is better.  He is tolerating pirfenidone  well no dysphagia.  In December 2024 he did have mild COVID they were able to give him some remaining stock on remdesivir  for 3 days.  In March 2025 he was at [facility] and his liver function test was normal.  His next trip to [place] will be in November 2025.  He is driving.  He feels he does not need oxygen.  I  advised him based on his symptoms, exercise desaturation test that he can return both nighttime and his portable oxygen system.       OV 01/22/2024  Subjective:  [name], male , DOB: 05-Dec-1940 , age 32 y.o. , MRN: [id number] , ADDRESS: [address] [place] [address] PCP Hugh Charleston, MD (Inactive) Patient Care Team: Hugh Charleston, MD (Inactive) as PCP - General (Family Medicine) Burnard Debby LABOR, MD (Inactive) as PCP - Cardiology (Cardiology) Mealor, Eulas BRAVO, MD as PCP - Electrophysiology (Cardiology)  This Provider for this visit: Treatment Team:  Attending Provider: Geronimo Amel, MD   Idiopathic pulmonary fibrosis on nintedanib  100 mg twice daily since Oct 2021   -last CT September 2022, pft oct 2022 -> progression on CT scan  -Stop nintedanib  September 2022 due to weight loss and intolerance  -Started pirfenidone  October 2022 -> reduced to 2 pills 3 times daily March 2023 due to weight loss  IPF flareup due to COVID-19 summer 2023 followed by aspiration pneumonia and acute respiratory failure needing oxygen and intubation  -History of intubation  - Course complicated by aspiration  -On prednisone  therapy since discharge  Weight loss unintentional even before nintedanib   -Improved September/October 2022 after stopping nintedanib   -relapse while in [place] but after starting pirfenidone  in the fall 2022 and early 2023.  -Progressive weight loss after summer 2023 hospitalization for COVID-19  - June 2021: 137#  - 114# - oct 2023  - 110# 06/20/2022  -105# 07/02/2022 (son surpise)d  - 104# 09/12/2022 and 01/23/2023  - 104# on 09/18/2023      Cough and Dysphagia ad clearing throat  -Normal endoscopy with Dr. Kristie September 2021/October 2021  -Abnormal barium study September 2022  -Status post endoscopy with esophageal stenosis dilatation by Dr. Rollin April 2023.  -Worse in October 2023 after some hospitalization for COVID  - s/p mild eso stenosis dilation 07/12/22   Normal right heart cath July 2023 without pulmonary hypertension.   Esbriet /Pirfenidone  requires intensive drug monitoring due to high concerns for Adverse effects of , including  Drug Induced Liver Injury, significant GI side effects that include but not limited to Diarrhea, Nausea, Vomiting,  and other system side effects that include Fatigue, headaches, weight loss and other side effects such as skin rash. These will be monitored with  blood work such as LFT initially once a month for 6 months and then quarterly   01/22/2024 -   Chief Complaint  Patient presents with   Medical Management of  Chronic Issues   Interstitial Lung Disease    PFT repeated 01/21/24. Breathing has been stable. No new co's.     HPI Jeff Willis 83 y.o. -returns for follow-up.  Presents with wife.  Son came later on the phone.  He has gained 3 pounds of weight he continues low-dose pirfenidone .  He has returned his oxygen.  No interim issues.  He had CT scan for some other reason March 2025.  Fibrosis is stable.  His pulmonary function test shows continued improvement in FVC.  He still worse off than compared to May 2023 very slowly getting back there.  He plans to travel to [place] in February 15, 2024 in [place] February 2026.  He wants 7-month supply of his pirfenidone .  I reached out to the pharmacist.  He had normal liver function test at his oncology visit at [facility] in July 2025.  He has another upcoming appointment October 2025 and he will check his  liver functions today according to his history.  He is up-to-date with respiratory vaccines.   Interim Health status: No new complaints No new medical problems. No new surgeries. No ER visits. No Urgent care visits. No changes to medications     Last Weight  Most recent update: 01/22/2024  1:27 PM    Weight  48.5 kg (107 lb)              SYMPTOM SCALE - ILD 10/05/2019 137 pounds 03/01/2020 131# - start ofev  oct 2021 07/24/2020 127# on ofev  (nintib from [facility] by [facility] x 2 months) 100mg  bid 09/22/2020 122# - on ofev  100mg  bid 01/11/2021 Tele visit - off ofev   128# not on any antifibrotic 02/06/2021 131# -esbriet  x 1 week 09/07/2021 125#  02/25/2022 114#  06/20/2022 110# 09/12/2022 104# 01/23/2023  03/06/2023 104# 09/18/2023 104# 01/22/2024 107#  O2 use ra ra ra ra  ra   ra  ra ra ra0  Shortness of Breath 0 -> 5 scale with 5 being worst (score 6 If unable to do)              At rest 0 0 0 0  0   0  0 2 0  Simple tasks - showers, clothes change, eating, shaving 0 0 1 0  0 0  1  0 1 1  Household (dishes, doing bed, laundry) 0 1  0 0  0  1.5  na  na 1 1  Shopping 0 0 0   0 nan  1  0 1 1  Walking level at own pace 0 0 0 0  0 1  0  0 0 0  Walking up Stairs 1 1 0 0  0 2  1  1 1 1   Total (30-36) Dyspnea Score 1 2 1  0  0 3.5  3  2 6 4   How bad is your cough? 2 0 2 2  2 5  3  1  0 2  How bad is your fatigue 0 0 0 0  0 4  3  0 0 0  How bad is nausea 0 0 1 0  0 0  0  0 0 0  How bad is vomiting?  00 0 0 0  0 0  0  0 0 0  How bad is diarrhea? 0 0 3 3  0 0  0  0 0 0  How bad is anxiety? 00 1 1 0  1 1.5  1  0 0 0  How bad is depression 0 1 1 0  1 1.5  0  0 0 2         Simple office walk 185 feet x  3 laps goal with forehead probe 10/05/2019  03/01/2020  07/24/2020  09/22/2020  02/06/2021  09/07/2021  09/12/2022  01/23/2023  03/06/2023   O2 used ra ra  ra ra ra ra ra ra  Number laps completed 3   3   Sit stand x 10 resp Sit stand x 10 with cane Sit stand x10  Comments about pace avt   Mod paced avg avg steady steady   Resting Pulse Ox/HR 100% and 122/min 98% and 110 1-00% and 120 100% and 109/min 99% and 89 100% and 85 97-96% and HR 113 97% and HR 73 95% and HR 69  Final Pulse Ox/HR 100% and 143/min 97% and 130 99% and 130 99% and 124/min 99% and 114 98% ad 115 92-93% and HR 115 95%  and HR 78 93% and HR 73  Desaturated </= 88% x x  no no  no    Desaturated <= 3% points x x  no no  yes    Got Tachycardic >/= 90/min x x  ye yes  yes    Symptoms at end of test x x  none none      Miscellaneous comments x x No dyspnea No dyspnea No dyspnea No dyspnea Mild 1 of 10 dyspnea 3 of 10 dyspnea        SIT STAND TEST - goal 15 times   09/18/2023  01/22/2024   O2 used ra ra  PRobe - finter or forehead finger finer  Number sit and stand completed - goal 15 15 15   Time taken to complete 60 56 sec  Resting Pulse Ox/HR/Dyspnea   99% and 64/min and dyspnea of 1/10  100% and HR 94  Peak measures 98 % and 78/min and dyspnea of 3/10 99% and hr 107  Final Pulse Ox/HR 94% and 74/min and dyspnea of 1/10 97% and HR 98  Desaturated </= 88% no  no  Desaturated <= 3% points yes yes  Got Tachycardic >/= 90/min no yes  Miscellaneous comments x x     PFT     Latest Ref Rng & Units 01/21/2024    2:26 PM 09/18/2023    1:47 PM 01/14/2023    4:00 PM 09/02/2022    2:04 PM 07/01/2022   10:42 AM 02/25/2022    3:12 PM 09/07/2021   10:48 AM  PFT Results  FVC-Pre L 2.01  P 1.97  1.78  1.88  1.68  1.96  2.63   FVC-Predicted Pre % 65  P 64  57  60  54  63  84   FVC-Post L     1.60     FVC-Predicted Post %     51     Pre FEV1/FVC % % 99  P 99  93  94  94  89  85   Post FEV1/FCV % %     92     FEV1-Pre L 1.99  P 1.95  1.66  1.76  1.58  1.75  2.24   FEV1-Predicted Pre % 86  P 84  71  75  68  74  95   FEV1-Post L     1.47     DLCO uncorrected ml/min/mmHg 9.07  P 11.14  5.40  0.44   8.46  12.24   DLCO UNC% % 33  P 41  20  1   31   45   DLCO corrected ml/min/mmHg 9.07  P  5.40  0.44   9.22  12.24   DLCO COR %Predicted % 33  P  20  1   34  45   DLVA Predicted % 66  P 80  52  6   68  72   TLC L     3.54     TLC % Predicted %     56     RV % Predicted %     74       P Preliminary result       LAB RESULTS last 96 hours No results found.       has a past medical history of Back pain, Elevated PSA, Essential tremor, Hypercholesteremia, Neuropathy, Prostate cancer (HCC), and Sinus tachycardia (01/30/2018).   reports that he has never smoked. He has never used smokeless tobacco.  Past Surgical History:  Procedure Laterality Date   COLONOSCOPY WITH PROPOFOL  N/A 08/10/2021   Procedure: COLONOSCOPY WITH PROPOFOL ;  Surgeon: Rollin Dover, MD;  Location: [facility];  Service: Gastroenterology;  Laterality: N/A;   ESOPHAGEAL DILATION  08/10/2021   Procedure: ESOPHAGEAL DILATION;  Surgeon: Rollin Dover, MD;  Location: [facility];  Service: Gastroenterology;;   ESOPHAGOGASTRODUODENOSCOPY (EGD) WITH PROPOFOL  N/A 08/10/2021   Procedure: ESOPHAGOGASTRODUODENOSCOPY (EGD) WITH PROPOFOL ;  Surgeon: Rollin Dover, MD;  Location: [facility];   Service: Gastroenterology;  Laterality: N/A;   ESOPHAGOGASTRODUODENOSCOPY (EGD) WITH PROPOFOL  N/A 03/12/2022   Procedure: ESOPHAGOGASTRODUODENOSCOPY (EGD) WITH PROPOFOL ;  Surgeon: Rollin Dover, MD;  Location: [facility];  Service: Gastroenterology;  Laterality: N/A;   ESOPHAGOGASTRODUODENOSCOPY (EGD) WITH PROPOFOL  N/A 07/12/2022   Procedure: ESOPHAGOGASTRODUODENOSCOPY (EGD) WITH PROPOFOL ;  Surgeon: Rollin Dover, MD;  Location: [facility];  Service: Gastroenterology;  Laterality: N/A;   HEMOSTASIS CLIP PLACEMENT  08/10/2021   Procedure: HEMOSTASIS CLIP PLACEMENT;  Surgeon: Rollin Dover, MD;  Location: [facility];  Service: Gastroenterology;;   LUMBAR MICRODISCECTOMY     L4-5   POLYPECTOMY  08/10/2021   Procedure: POLYPECTOMY;  Surgeon: Rollin Dover, MD;  Location: [facility];  Service: Gastroenterology;;   RADIOACTIVE SEED IMPLANT N/A 02/24/2018   Procedure: RADIOACTIVE SEED IMPLANT/BRACHYTHERAPY IMPLANT;  Surgeon: Nieves Cough, MD;  Location: [facility];  Service: Urology;  Laterality: N/A;   RIGHT HEART CATH N/A 11/22/2021   Procedure: RIGHT HEART CATH;  Surgeon: Burnard Debby LABOR, MD;  [facility];  Service: Cardiovascular;  Laterality: N/A;   SAVORY DILATION N/A 03/12/2022   Procedure: SAVORY DILATION;  Surgeon: Rollin Dover, MD;  Location: [facility];  Service: Gastroenterology;  Laterality: N/A;   SAVORY DILATION N/A 07/12/2022   Procedure: SAVORY DILATION;  Surgeon: Rollin Dover, MD;  Location: [facility];  Service: Gastroenterology;  Laterality: N/A;   SPACE OAR INSTILLATION N/A 02/24/2018   Procedure: SPACE OAR INSTILLATION;  Surgeon: Nieves Cough, MD;  Location: [facility];  Service: Urology;  Laterality: N/A;    Allergies  Allergen Reactions   Beef-Derived Drug Products Other (See Comments)    Vegetarian   Chicken Protein Other (See Comments)    Vegetarian   Egg-Derived Products Other (See Comments)     Vegetarian   Fish-Derived Products Other (See Comments)    Vegetarian   Meat Extract Other (See Comments)    Vegetarian   Ofev  [Nintedanib ] Other (See Comments)    Weight loss   Pork-Derived Products Other (See Comments)    Vegetarian    Immunization History  Administered Date(s) Administered   INFLUENZA, HIGH DOSE SEASONAL PF 02/07/2015, 01/19/2016, 01/20/2017, 01/30/2018, 01/28/2019, 01/12/2021, 02/11/2022, 01/08/2023, 01/13/2024   Influenza-Unspecified 01/05/2020   PFIZER(Purple Top)SARS-COV-2 Vaccination 07/23/2019, 08/17/2019   Pneumococcal Conjugate-13 11/29/2014   Pneumococcal-Unspecified 02/28/2015   Respiratory Syncytial Virus Vaccine,Recomb Aduvanted(Arexvy) 01/13/2024   Tdap 01/08/2023   Zoster, Live 01/26/2021    Family History  Problem Relation Age of Onset   Other Mother        died at 78 of natural causes   Other Father        unsure of health   Cancer Neg Hx      Current Outpatient Medications:    Aspirin  81 MG CAPS, ASPIRIN  81 MG, Disp: , Rfl:    clobetasol  ointment (TEMOVATE ) 0.05 %, Apply twice daily everyday for 6 weeks, Disp: 30 Willis, Rfl: 2   Cyanocobalamin (VITAMIN B12) 1000 MCG TBCR, 1 tablet Orally Once a week, Disp: , Rfl:  darolutamide (NUBEQA) 300 MG tablet, Take 300 mg by mouth 2 (two) times daily with a meal., Disp: , Rfl:    famotidine  (PEPCID ) 40 MG tablet, Take 40 mg by mouth., Disp: , Rfl:    metoprolol  succinate (TOPROL -XL) 50 MG 24 hr tablet, TAKE 1 TABLET BY MOUTH DAILY WITH OR IMMEDIATELY FOLLOWING A MEAL, Disp: 90 tablet, Rfl: 1   pantoprazole  (PROTONIX ) 40 MG tablet, Take 1 tablet (40 mg total) by mouth 2 (two) times daily., Disp: 30 tablet, Rfl: 2   Pirfenidone  267 MG TABS, Take 2 tablets (534 mg) by mouth in the morning, at noon, and at bedtime. **please dispense 3 month supply of TABLETS if possible**, Disp: 540 tablet, Rfl: 1   pregabalin (LYRICA) 50 MG capsule, Take 50 mg by mouth 2 (two) times daily. Patient takes 100 mg at night  and 50 mg daily, Disp: , Rfl:    rosuvastatin  (CRESTOR ) 10 MG tablet, TAKE 1 TABLET BY MOUTH DAILY, Disp: 90 tablet, Rfl: 3      Objective:   Vitals:   01/22/24 1323  BP: (!) 102/58  Pulse: 94  SpO2: 100%  Weight: 107 lb (48.5 kg)  Height: 5' 6 (1.676 m)    Estimated body mass index is 17.27 kg/m as calculated from the following:   Height as of this encounter: 5' 6 (1.676 m).   Weight as of this encounter: 107 lb (48.5 kg).  @WEIGHTCHANGE @  [employer]   01/22/24 1323  Weight: 107 lb (48.5 kg)     Physical Exam   General: No distress. Thin. Looks  better O2 at rest: no Cane present: no Sitting in wheel chair: no Frail: no Obese: no Neuro: Alert and Oriented x 3. GCS 15. Speech normal Psych: Pleasant Resp:  Barrel Chest - no.  Wheeze - no, Crackles - yes mild bse, No overt respiratory distress CVS: Normal heart sounds. Murmurs - no Ext: Stigmata of Connective Tissue Disease - no HEENT: Normal upper airway. PEERL +. No post nasal drip        Assessment/     Assessment & Plan IPF (idiopathic pulmonary fibrosis) (HCC)  Encounter for therapeutic drug monitoring    PLAN Patient Instructions  IPF (idiopathic pulmonary fibrosis) (HCC) Chronic respiratory failure after COVID-19 related hospitalization summer 2023   -PFT Sept  2025 better than  OCt 2023 but worse than May 2023 - Noted covid (mild) in [place] in dec 2024 - Noted Normal liver functino test at at [facility] March 2025 and July 2025 - glad overall stable and well  Plan -continue esbriet  2 pills three times daily with food    - sent message to our pharmacist to give ryou 3 month travel supply - spirometry and March 2026 after [place] trip   Weight loss, non-intentional  -stable/impro    Plan -monitor for weight loss with esbriet  for now -Continue high-protein diet as below   Hilar adenopathy along with pleural metastasis on PET scan August 2024 Hx of Prostate Cancer  -There is  presumption that this is from prostate cancer at [facility] - per your oncologist  COUGH and DYSPHAGIA and clearing throat New History of intubation - summer 2023  - - s/p dilatation by Dr Rollin April 2023 and march 2024  - ENT eval by [facility] by Dr. Morna Cohens in April 2024 diagnosis irritable larynx - no issues on 09/18/2023 and.td   Plan -per ENT and GI       Follow-up  - March 2026 -  15 min visit after back from [place]   - symptom score and exercise hypoxemia test    FOLLOWUP    Return for  - March 2026 - 15 min visit after back from [place]   with Alfredo Collymore.    SIGNATURE    Dr. Dorethia Cave, M.D., F.C.C.P,  Pulmonary and Critical Care Medicine Staff Physician, [facility] Center Director - Interstitial Lung Disease  Program  Pulmonary Fibrosis [facility] at [facility] [place], [place], [address]  Pager: [phone], If no answer or between  15:00h - 7:00h: call [phone] Telephone: [phone]  1:54 PM 01/22/2024   Moderate Complexity MDM OFFICE  2021 E/M guidelines, first released in 2021, with minor revisions added in 2023 and 2024 Must meet the requirements for 2 out of 3 dimensions to qualify.    Number and complexity of problems addressed Amount and/or complexity of data reviewed Risk of complications and/or morbidity  One or more chronic illness with mild exacerbation, OR progression, OR  side effects of treatment  Two or more stable chronic illnesses  One undiagnosed new problem with uncertain prognosis  One acute illness with systemic symptoms   One Acute complicated injury Must meet the requirements for 1 of 3 of the categories)  Category 1: Tests and documents, historian  Any combination of 3 of the following:  Assessment requiring an independent historian  Review of prior external note(s) from each unique source  Review of results of each unique test  Ordering of each unique  test    Category 2: Interpretation of tests   Independent interpretation of a test performed by another physician/other qualified health care professional (not separately reported)  Category 3: Discuss management/tests  Discussion of management or test interpretation with external physician/other qualified health care professional/appropriate source (not separately reported) Moderate risk of morbidity from additional diagnostic testing or treatment Examples only:  Prescription drug management  Decision regarding minor surgery with identfied patient or procedure risk factors  Decision regarding elective major surgery without identified patient or procedure risk factors  Diagnosis or treatment significantly limited by social determinants of health             HIGh Complexity  OFFICE   2021 E/M guidelines, first released in 2021, with minor revisions added in 2023. Must meet the requirements for 2 out of 3 dimensions to qualify.    Number and complexity of problems addressed Amount and/or complexity of data reviewed Risk of complications and/or morbidity  Severe exacerbation of chronic illness  Acute or chronic illnesses that may pose a threat to life or bodily function, e.Willis., multiple trauma, acute MI, pulmonary embolus, severe respiratory distress, progressive rheumatoid arthritis, psychiatric illness with potential threat to self or others, peritonitis, acute renal failure, abrupt change in neurological status Must meet the requirements for 2 of 3 of the categories)  Category 1: Tests and documents, historian  Any combination of 3 of the following:  Assessment requiring an independent historian  Review of prior external note(s) from each unique source  Review of results of each unique test  Ordering of each unique test    Category 2: Interpretation of tests    Independent interpretation of a test performed by another physician/other qualified health care  professional (not separately reported)  Category 3: Discuss management/tests  Discussion of management or test interpretation with external physician/other qualified health care professional/appropriate source (not separately reported)  HIGH risk of morbidity from additional  diagnostic testing or treatment Examples only:  Drug therapy requiring intensive monitoring for toxicity  Decision for elective major surgery with identified pateint or procedure risk factors  Decision regarding hospitalization or escalation of level of care  Decision for DNR or to de-escalate care   Parenteral controlled  substances            LEGEND - Independent interpretation involves the interpretation of a test for which there is a CPT code, and an interpretation or report is customary. When a review and interpretation of a test is performed and documented by the provider, but not separately reported (billed), then this would represent an independent interpretation. This report does not need to conform to the usual standards of a complete report of the test. This does not include interpretation of tests that do not have formal reports such as a complete blood count with differential and blood cultures. Examples would include reviewing a chest radiograph and documenting in the medical record an interpretation, but not separately reporting (billing) the interpretation of the chest radiograph.   An appropriate source includes professionals who are not health care professionals but may be involved in the management of the patient, such as a Clinical research associate, upper officer, case manager or teacher, and does not include discussion with family or informal caregivers.    - SDOH: SDOH are the conditions in the environments where people are born, live, learn, work, play, worship, and age that affect a wide range of health, functioning, and quality-of-life outcomes and risks. (e.Willis., housing, food insecurity,  transportation, etc.). SDOH-related Z codes ranging from Z55-Z65 are the ICD-10-CM diagnosis codes used to document SDOH data Z55 - Problems related to education and literacy Z56 - Problems related to employment and unemployment Z57 - Occupational exposure to risk factors Z58 - Problems related to physical environment Z59 - Problems related to housing and economic circumstances [phone] - Problems related to social environment [phone] - Problems related to upbringing [phone] - Other problems related to primary support group, including family circumstances Z58 - Problems related to certain psychosocial circumstances Z65 - Problems related to other psychosocial circumstances

## 2024-01-21 NOTE — Patient Instructions (Signed)
 Spirometry and diffusion capacity performed today.

## 2024-01-22 ENCOUNTER — Encounter: Payer: Self-pay | Admitting: Internal Medicine

## 2024-01-22 ENCOUNTER — Ambulatory Visit: Admitting: Internal Medicine

## 2024-01-22 ENCOUNTER — Telehealth: Payer: Self-pay | Admitting: Internal Medicine

## 2024-01-22 ENCOUNTER — Other Ambulatory Visit (HOSPITAL_COMMUNITY): Payer: Self-pay

## 2024-01-22 VITALS — BP 102/58 | HR 94 | Ht 66.0 in | Wt 107.0 lb

## 2024-01-22 DIAGNOSIS — Z5181 Encounter for therapeutic drug level monitoring: Secondary | ICD-10-CM

## 2024-01-22 DIAGNOSIS — J84112 Idiopathic pulmonary fibrosis: Secondary | ICD-10-CM | POA: Diagnosis not present

## 2024-01-22 NOTE — Telephone Encounter (Signed)
 Jeff  Javani G Willis - is going to [place] 02/05/24 and back  FEb 2026 . He wants 3 months esbroet low dose protocol. Please facilitate    SIGNATURE    Dr. Dorethia Cave, M.D., F.C.C.P,  Pulmonary and Critical Care Medicine Staff Physician, [facility] Center Director - Interstitial Lung Disease  Program  Pulmonary Fibrosis [facility] at [facility] [place], [place], [address]   Pager: [phone], If no answer  -> Check AMION or Try [phone] Telephone (clinical office): [phone] Telephone (research): [phone]  1:47 PM 01/22/2024

## 2024-01-22 NOTE — Telephone Encounter (Signed)
 Spoke to the pt's son Mackey and he advised me they are leaving on 10/20. I told him another refill should process on 10/16 if they are ok to wait until then and an override shouldn't be needed. He wanted to ensure it will be filled for a 90 day supply. Per previous claims they have always filled for 30 days, not sure if this is due to supply or insurance. Aleck will check with the pharmacy to see why it has only been 30 day supplies. I advised [name] to request a refill for 90 days on 10/16 and gave him my direct line if he has any issues.

## 2024-01-23 ENCOUNTER — Other Ambulatory Visit (HOSPITAL_COMMUNITY): Payer: Self-pay

## 2024-02-09 ENCOUNTER — Telehealth: Payer: Self-pay

## 2024-02-09 ENCOUNTER — Other Ambulatory Visit: Payer: Self-pay

## 2024-02-09 ENCOUNTER — Other Ambulatory Visit (HOSPITAL_COMMUNITY): Payer: Self-pay

## 2024-02-09 DIAGNOSIS — J84112 Idiopathic pulmonary fibrosis: Secondary | ICD-10-CM

## 2024-02-09 MED ORDER — PIRFENIDONE 267 MG PO TABS
ORAL_TABLET | ORAL | 1 refills | Status: DC
  Filled 2024-02-09: qty 540, fill #0
  Filled 2024-02-09: qty 540, 90d supply, fill #0
  Filled 2024-04-26: qty 180, 30d supply, fill #1
  Filled 2024-05-27: qty 180, 30d supply, fill #2

## 2024-02-09 NOTE — Telephone Encounter (Signed)
 Called [employer] [phone] to request vacation override. Override was placed and pt is able to fill a 90 day supply of medication today. Notified Landry Lewis at the pharmacy who said she will reach out to the pt's son.

## 2024-02-09 NOTE — Progress Notes (Signed)
 Specialty Pharmacy Ongoing Clinical Assessment Note  Jeff Willis is a 83 y.o. male who is being followed by the specialty pharmacy service for RxSp Interstitial Lung Disease   Patient's specialty medication(s) reviewed today: Pirfenidone    Missed doses in the last 4 weeks: 0   Patient/Caregiver did not have any additional questions or concerns.   Therapeutic benefit summary: Patient is achieving benefit   Adverse events/side effects summary: No adverse events/side effects   Patient's therapy is appropriate to: Continue    Goals Addressed             This Visit's Progress    Maintain optimal adherence to therapy   On track    Patient is on track. Patient will maintain adherence and adhere to provider and/or lab appointments         Follow up: 12 months  [facility] Specialty Pharmacist

## 2024-02-09 NOTE — Progress Notes (Signed)
 Specialty Pharmacy Refill Coordination Note  [name] is a 83 y.o. male contacted today regarding refills of specialty medication(s) Pirfenidone    Patient requested Delivery   Delivery date: 02/11/24   Verified address: [address] [place] [address]   Medication will be filled on 02/10/24.    **Patient going out of town for 4 months on October 19**

## 2024-02-09 NOTE — Telephone Encounter (Signed)
 Received request via staff message for pirfenidone  3 month supply due to vacation.   Rx for 3 month supply of pirfenidone  entered.  Aleck Puls, PharmD, BCPS, CPP Clinical Pharmacist  [facility]

## 2024-02-10 ENCOUNTER — Other Ambulatory Visit (HOSPITAL_COMMUNITY): Payer: Self-pay

## 2024-02-10 ENCOUNTER — Ambulatory Visit (INDEPENDENT_AMBULATORY_CARE_PROVIDER_SITE_OTHER): Admitting: Physician Assistant

## 2024-02-10 ENCOUNTER — Encounter: Payer: Self-pay | Admitting: Physician Assistant

## 2024-02-10 VITALS — BP 94/74 | HR 75

## 2024-02-10 DIAGNOSIS — R21 Rash and other nonspecific skin eruption: Secondary | ICD-10-CM

## 2024-02-10 DIAGNOSIS — D489 Neoplasm of uncertain behavior, unspecified: Secondary | ICD-10-CM

## 2024-02-10 DIAGNOSIS — L819 Disorder of pigmentation, unspecified: Secondary | ICD-10-CM

## 2024-02-10 NOTE — Patient Instructions (Addendum)

## 2024-02-10 NOTE — Progress Notes (Signed)
   Follow-Up Visit   Subjective  Jeff Willis is a 83 y.o. male who presents for the following: follow up of hyperpigmentation.   Patient present today for follow up visit for hyperpigmentation. Patient was last evaluated on 01/19/24. At this visit patient was prescribed clobetasol  ointment (TEMOVATE ) 0.05 % . Patient reports sxs are unchanged.   Of note: patient did recently start on Nubeqa for metastatic prostate cancer ~ 8 months ago. Rash has been ongoing now x 3-4 months.   The following portions of the chart were reviewed this encounter and updated as appropriate: medications, allergies, medical history  Review of Systems:  No other skin or systemic complaints except as noted in HPI or Assessment and Plan.  Objective  Well appearing patient in no apparent distress; mood and affect are within normal limits.    A focused examination was performed of the following areas: Neck  Relevant exam findings are noted in the Assessment and Plan.       Neck - Posterior Asymptomatic Irregular dark patches on posterior neck (only) with metastatic prostate cancer (on RX NEBAQA)   Assessment & Plan   RASH -- POSTERIOR NECK  - unchanged with topical steroids  - rash is still completely asymptomatic  - no other location except posterior neck  - plan biopsy today - I did read that rashes are a common side effect from Nubeqa.  - patient is leaving for [place] and will not be back until Feb 2026. He asked that we correspond with his [place]. d    NEOPLASM OF UNCERTAIN BEHAVIOR Neck - Posterior Skin / nail biopsy Type of biopsy: punch   Informed consent: discussed and consent obtained   Timeout: patient name, date of birth, surgical site, and procedure verified   Procedure prep:  Patient was prepped and draped in usual sterile fashion (the patient was cleaned and prepped) Prep type:  Isopropyl alcohol Anesthesia: the lesion was anesthetized in a standard fashion    Anesthetic:  1% lidocaine  w/ epinephrine 1-100,000 buffered w/ 8.4% NaHCO3 Hemostasis achieved with: Gelfoam   Outcome: patient tolerated procedure well   Post-procedure details: sterile dressing applied and wound care instructions given   Dressing type: petrolatum  and pressure dressing    Specimen 1 - Surgical pathology Differential Diagnosis: photo sensitive drug eruption VS ECZEMA VS TINEA VA ACANTHOSIS NIGRICANS  Check Margins: No  Return for PENDING PATHOLOGY.  I, Doyce Pan, CMA, am acting as scribe for Akaylah Lalley K, PA-C.   Documentation: I have reviewed the above documentation for accuracy and completeness, and I agree with the above.  Prather Failla K, PA-C

## 2024-02-11 ENCOUNTER — Other Ambulatory Visit (HOSPITAL_COMMUNITY): Payer: Self-pay

## 2024-02-11 LAB — SURGICAL PATHOLOGY

## 2024-02-11 NOTE — Telephone Encounter (Signed)
 Called insurance and got vacation override placed. Pharmacy was able to process claim for 90 day supply and medication was shipped on 10/14.

## 2024-02-13 ENCOUNTER — Ambulatory Visit: Payer: Self-pay | Admitting: Physician Assistant

## 2024-02-20 ENCOUNTER — Encounter

## 2024-02-23 ENCOUNTER — Ambulatory Visit: Admitting: Internal Medicine

## 2024-03-22 ENCOUNTER — Other Ambulatory Visit: Payer: Self-pay

## 2024-03-24 ENCOUNTER — Other Ambulatory Visit: Payer: Self-pay | Admitting: Family

## 2024-03-29 MED ORDER — ROSUVASTATIN CALCIUM 10 MG PO TABS: 10.0000 mg | ORAL_TABLET | Freq: Every day | ORAL | 1 refills | Status: AC

## 2024-04-26 ENCOUNTER — Other Ambulatory Visit (HOSPITAL_COMMUNITY): Payer: Self-pay

## 2024-04-26 ENCOUNTER — Other Ambulatory Visit: Payer: Self-pay

## 2024-04-26 NOTE — Progress Notes (Signed)
 Specialty Pharmacy Refill Coordination Note  Jeff Willis is a 83 y.o. male contacted today regarding refills of specialty medication(s) Pirfenidone    Patient requested Delivery   Delivery date: 04/28/24   Verified address: [address] [place] [address]   Medication will be filled on: 04/26/24

## 2024-05-27 ENCOUNTER — Other Ambulatory Visit: Payer: Self-pay

## 2024-05-28 ENCOUNTER — Ambulatory Visit: Attending: Internal Medicine

## 2024-05-28 ENCOUNTER — Other Ambulatory Visit: Payer: Self-pay

## 2024-05-28 DIAGNOSIS — J849 Interstitial pulmonary disease, unspecified: Secondary | ICD-10-CM

## 2024-05-28 DIAGNOSIS — J84112 Idiopathic pulmonary fibrosis: Secondary | ICD-10-CM

## 2024-05-28 MED ORDER — PIRFENIDONE 267 MG PO TABS
ORAL_TABLET | ORAL | 1 refills | Status: AC
  Filled 2024-05-28: qty 540, fill #0
  Filled 2024-06-01: qty 540, 90d supply, fill #0

## 2024-05-28 NOTE — Addendum Note (Signed)
 Addended by: Anais Koenen L on: 05/28/2024 11:59 AM   Modules accepted: Orders

## 2024-05-28 NOTE — Progress Notes (Signed)
 [facility]  Referring Provider: Dr. Geronimo  Virtual Visit via Telephone Note  I connected with the son/caregiver of Jeff Willis on 05/28/24 at 11:30 AM EST by telephone and verified that I am speaking with the correct person using two identifiers.  Location: Son/caregiver: home Provider: office   I discussed the limitations, risks, security and privacy concerns of performing an evaluation and management service by telephone and the availability of in person appointments. I also discussed with the patient that there may be a patient responsible charge related to this service. The patient expressed understanding and agreed to proceed.   HPI: Jeff Willis is a 84 y.o. male whose caregiver/son presents to the pharmacotherapy clinic via telephone for follow-up pirfenidone  counseling. Refill is due next week.  This visit was scheduled with son/caregiver, [name], on behalf of the patient. [name] helps manage patient's medications.  Patient Active Problem List   Diagnosis Date Noted   Esophageal stenosis 04/01/2022   Pedal edema 04/01/2022   IPF (idiopathic pulmonary fibrosis) (HCC) 03/20/2022   Chronic respiratory failure with hypoxia (HCC) 03/20/2022   Urinary retention 01/28/2022   Thrombocytopenia 01/28/2022   Odynophagia 01/28/2022   Debility 01/18/2022   Pressure injury of skin 01/06/2022   Protein-calorie malnutrition, severe 01/01/2022   Acute respiratory failure with hypoxia (HCC) 12/31/2021   COVID-19 virus infection 12/31/2021   History of sinus tachycardia 12/31/2021   Hyponatremia 12/31/2021   Pneumonia due to COVID-19 virus 12/31/2021   Colon cancer screening 07/27/2020   Dry mouth 07/27/2020   Epigastric pain 07/27/2020   Gastroesophageal reflux disease 07/27/2020   Spinal stenosis of lumbosacral region 07/27/2020   Interstitial lung disease (HCC) 09/09/2019   Sacroiliitis 08/04/2019   PAD (peripheral artery disease)  08/04/2019   Upper airway cough syndrome 08/04/2019   Pain in left knee 08/12/2018   Malignant neoplasm of prostate (HCC) 10/08/2017   Cough 01/23/2017   Laryngopharyngeal reflux (LPR) 01/23/2017   Right lumbar radiculopathy 01/22/2017    Patient's Medications  New Prescriptions   No medications on file  Previous Medications   ASPIRIN  81 MG CAPS    ASPIRIN  81 MG   CLOBETASOL  OINTMENT (TEMOVATE ) 0.05 %    Apply twice daily everyday for 6 weeks   CYANOCOBALAMIN (VITAMIN B12) 1000 MCG TBCR    1 tablet Orally Once a week   DAROLUTAMIDE (NUBEQA) 300 MG TABLET    Take 300 mg by mouth 2 (two) times daily with a meal.   FAMOTIDINE  (PEPCID ) 40 MG TABLET    Take 40 mg by mouth.   METOPROLOL  SUCCINATE (TOPROL -XL) 50 MG 24 HR TABLET    TAKE 1 TABLET BY MOUTH DAILY WITH OR IMMEDIATELY FOLLOWING A MEAL   PANTOPRAZOLE  (PROTONIX ) 40 MG TABLET    Take 1 tablet (40 mg total) by mouth 2 (two) times daily.   PIRFENIDONE  267 MG TABS    Take 2 tablets (534 mg) by mouth in the morning, at noon, and at bedtime. **please dispense 3 month supply of TABLETS if possible**   PREGABALIN (LYRICA) 50 MG CAPSULE    Take 50 mg by mouth 2 (two) times daily. Patient takes 100 mg at night and 50 mg daily   ROSUVASTATIN  (CRESTOR ) 10 MG TABLET    Take 1 tablet (10 mg total) by mouth daily.  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   Right-handed.   No caffeine use.   One daughter and two sons.   Social Drivers of Health   Tobacco Use: Low Risk (02/10/2024)   Patient History    Smoking Tobacco Use: Never    Smokeless Tobacco Use: Never    Passive Exposure: Not on file  Financial Resource Strain: Not on file  Food Insecurity: No Food Insecurity (01/07/2022)   Hunger Vital Sign    Worried About Running Out of Food in the Last Year: Never true    Ran Out of Food in the Last Year: Never true  Transportation Needs: No Transportation Needs (01/07/2022)   PRAPARE - Administrator, Civil Service (Medical): No    Lack of Transportation (Non-Medical): No  Physical Activity: Not on file  Stress: Not on file  Social Connections: Not on file  Depression (PHQ2-9): Low Risk (06/13/2022)   Depression (PHQ2-9)    PHQ-2 Score: 2  Alcohol Screen: Not on file  Housing: Unknown (08/01/2023)   Received from [facility]   Epic    Unable to Pay for Housing in the Last Year: Not on file    Number of Times Moved in the Last Year: Not on file    At any time in the past 12 months, were you homeless or living in a shelter (including now)?: No  Utilities: Not At Risk (01/07/2022)   [facility] Utilities    Threatened with loss of utilities: No  Health Literacy: Not on file    Medication: Pirfenidone  (Esbriet ) 267mg  tablets - Take 2 tablets by mouth three times daily   Assessment: Patient continues pirfenidone  for IPF. He is currently in [place] for extended travel, but received 3 month supply of pirfenidone  prior to leaving for [place]. No known gaps in therapy per son, Mackey. No new side effects or concerns with therapy noted. Ongoing  decreased appetite, which has been discussed with pulmonology previously and [name] notes is likely multifactorial.   LFTs stable 02/03/24.  Patient returns from [place] in late February.  Plan: - CONTINUE pirfenidone  (Esbriet ) 267mg  tablets - Take 2 tablets by mouth three times daily  - Rx triaged to [facility].   I discussed the assessment and treatment plan with the patient's caregiver/son, [name]. The patient's caregiver/son, Mackey was provided an opportunity to ask questions and all were answered. The patient's caregiver/son, [name], agreed with the plan and demonstrated an understanding of the instructions.   The patient's caregiver/son, Mackey, was advised to call back or seek an in-person evaluation if the symptoms worsen or if the condition fails to  improve as anticipated.  I provided 10 minutes of non-face-to-face time during this encounter.  Aleck Puls, PharmD, BCPS, CPP Clinical Pharmacist  [facility]  [facility]     [1]  Allergies Allergen Reactions   Bovine (Beef) Protein-Containing Drug Products Other (See Comments)    Vegetarian   Chicken Protein Other (See Comments)    Vegetarian   Egg Protein-Containing Drug Products Other (See Comments)    Vegetarian   Fish Protein-Containing Drug Products Other (See Comments)    Vegetarian   Meat Extract Other (See Comments)    Vegetarian   Ofev  [Nintedanib ] Other (See Comments)    Weight loss   Porcine (Pork) Protein-Containing Drug Products Other (See Comments)    Vegetarian

## 2024-06-01 ENCOUNTER — Other Ambulatory Visit: Payer: Self-pay

## 2024-06-03 ENCOUNTER — Other Ambulatory Visit (HOSPITAL_COMMUNITY): Payer: Self-pay

## 2024-06-03 NOTE — Progress Notes (Signed)
 Specialty Pharmacy Refill Coordination Note  Jeff Willis is a 84 y.o. male contacted today regarding refills of specialty medication(s) Pirfenidone    Patient requested Delivery   Delivery date: 06/10/24   Verified address: [address] [place] [id number]   Medication will be filled on: 06/09/24

## 2024-07-09 ENCOUNTER — Ambulatory Visit

## 2024-07-27 ENCOUNTER — Other Ambulatory Visit (HOSPITAL_COMMUNITY)
